# Patient Record
Sex: Male | Born: 1960
Health system: Southern US, Community
[De-identification: ages and names within clinical notes are randomized; demographics above are authoritative.]

## PROBLEM LIST (undated history)

## (undated) DIAGNOSIS — E785 Hyperlipidemia, unspecified: Secondary | ICD-10-CM

## (undated) DIAGNOSIS — R519 Headache, unspecified: Secondary | ICD-10-CM

## (undated) DIAGNOSIS — R7303 Prediabetes: Secondary | ICD-10-CM

## (undated) DIAGNOSIS — G4730 Sleep apnea, unspecified: Secondary | ICD-10-CM

## (undated) DIAGNOSIS — J449 Chronic obstructive pulmonary disease, unspecified: Secondary | ICD-10-CM

## (undated) DIAGNOSIS — D72829 Elevated white blood cell count, unspecified: Secondary | ICD-10-CM

## (undated) DIAGNOSIS — I10 Essential (primary) hypertension: Secondary | ICD-10-CM

## (undated) DIAGNOSIS — Z955 Presence of coronary angioplasty implant and graft: Secondary | ICD-10-CM

## (undated) DIAGNOSIS — J439 Emphysema, unspecified: Secondary | ICD-10-CM

## (undated) DIAGNOSIS — J40 Bronchitis, not specified as acute or chronic: Secondary | ICD-10-CM

## (undated) DIAGNOSIS — K219 Gastro-esophageal reflux disease without esophagitis: Secondary | ICD-10-CM

## (undated) DIAGNOSIS — R569 Unspecified convulsions: Secondary | ICD-10-CM

## (undated) DIAGNOSIS — IMO0001 Reserved for inherently not codable concepts without codable children: Secondary | ICD-10-CM

## (undated) DIAGNOSIS — I2510 Atherosclerotic heart disease of native coronary artery without angina pectoris: Secondary | ICD-10-CM

## (undated) DIAGNOSIS — G4733 Obstructive sleep apnea (adult) (pediatric): Secondary | ICD-10-CM

## (undated) DIAGNOSIS — J069 Acute upper respiratory infection, unspecified: Secondary | ICD-10-CM

## (undated) DIAGNOSIS — Z7902 Long term (current) use of antithrombotics/antiplatelets: Secondary | ICD-10-CM

## (undated) DIAGNOSIS — K429 Umbilical hernia without obstruction or gangrene: Secondary | ICD-10-CM

## (undated) DIAGNOSIS — N529 Male erectile dysfunction, unspecified: Secondary | ICD-10-CM

## (undated) DIAGNOSIS — Z860100 Personal history of colon polyps, unspecified: Secondary | ICD-10-CM

## (undated) DIAGNOSIS — I509 Heart failure, unspecified: Secondary | ICD-10-CM

## (undated) DIAGNOSIS — F17200 Nicotine dependence, unspecified, uncomplicated: Secondary | ICD-10-CM

## (undated) DIAGNOSIS — IMO0002 Reserved for concepts with insufficient information to code with codable children: Secondary | ICD-10-CM

## (undated) DIAGNOSIS — T7840XA Allergy, unspecified, initial encounter: Secondary | ICD-10-CM

## (undated) DIAGNOSIS — Z8719 Personal history of other diseases of the digestive system: Secondary | ICD-10-CM

## (undated) DIAGNOSIS — H269 Unspecified cataract: Secondary | ICD-10-CM

## (undated) DIAGNOSIS — I700 Atherosclerosis of aorta: Secondary | ICD-10-CM

## (undated) DIAGNOSIS — E8881 Metabolic syndrome: Secondary | ICD-10-CM

## (undated) DIAGNOSIS — Z87891 Personal history of nicotine dependence: Secondary | ICD-10-CM

## (undated) DIAGNOSIS — Z9989 Dependence on other enabling machines and devices: Secondary | ICD-10-CM

## (undated) DIAGNOSIS — Z8711 Personal history of peptic ulcer disease: Secondary | ICD-10-CM

## (undated) DIAGNOSIS — I5030 Unspecified diastolic (congestive) heart failure: Secondary | ICD-10-CM

## (undated) DIAGNOSIS — I200 Unstable angina: Secondary | ICD-10-CM

## (undated) DIAGNOSIS — K649 Unspecified hemorrhoids: Secondary | ICD-10-CM

## (undated) DIAGNOSIS — Z9861 Coronary angioplasty status: Secondary | ICD-10-CM

## (undated) DIAGNOSIS — I2109 ST elevation (STEMI) myocardial infarction involving other coronary artery of anterior wall: Secondary | ICD-10-CM

## (undated) DIAGNOSIS — I255 Ischemic cardiomyopathy: Secondary | ICD-10-CM

## (undated) DIAGNOSIS — I779 Disorder of arteries and arterioles, unspecified: Secondary | ICD-10-CM

## (undated) DIAGNOSIS — R51 Headache: Secondary | ICD-10-CM

## (undated) DIAGNOSIS — I2119 ST elevation (STEMI) myocardial infarction involving other coronary artery of inferior wall: Secondary | ICD-10-CM

## (undated) DIAGNOSIS — R011 Cardiac murmur, unspecified: Secondary | ICD-10-CM

## (undated) DIAGNOSIS — E119 Type 2 diabetes mellitus without complications: Secondary | ICD-10-CM

## (undated) DIAGNOSIS — J189 Pneumonia, unspecified organism: Secondary | ICD-10-CM

## (undated) HISTORY — DX: ST elevation (STEMI) myocardial infarction involving other coronary artery of anterior wall: I21.09

## (undated) HISTORY — DX: Atherosclerotic heart disease of native coronary artery without angina pectoris: Z98.61

## (undated) HISTORY — DX: Metabolic syndrome: E88.810

## (undated) HISTORY — DX: Sleep apnea, unspecified: G47.30

## (undated) HISTORY — PX: TRANSTHORACIC ECHOCARDIOGRAM: SHX275

## (undated) HISTORY — PX: HERNIA REPAIR: SHX51

## (undated) HISTORY — DX: ST elevation (STEMI) myocardial infarction involving other coronary artery of inferior wall: I21.19

## (undated) HISTORY — DX: Umbilical hernia without obstruction or gangrene: K42.9

## (undated) HISTORY — DX: Hyperlipidemia, unspecified: E78.5

## (undated) HISTORY — DX: Heart failure, unspecified: I50.9

## (undated) HISTORY — DX: Bronchitis, not specified as acute or chronic: J40

## (undated) HISTORY — DX: Prediabetes: R73.03

## (undated) HISTORY — DX: Unstable angina: I20.0

## (undated) HISTORY — DX: Personal history of nicotine dependence: Z87.891

## (undated) HISTORY — DX: Metabolic syndrome: E88.81

## (undated) HISTORY — DX: Atherosclerotic heart disease of native coronary artery without angina pectoris: I25.10

## (undated) HISTORY — DX: Acute upper respiratory infection, unspecified: J06.9

## (undated) HISTORY — DX: Reserved for concepts with insufficient information to code with codable children: IMO0002

## (undated) HISTORY — DX: Essential (primary) hypertension: I10

## (undated) HISTORY — DX: Allergy, unspecified, initial encounter: T78.40XA

---

## 1999-02-09 DIAGNOSIS — I2109 ST elevation (STEMI) myocardial infarction involving other coronary artery of anterior wall: Secondary | ICD-10-CM

## 1999-02-09 HISTORY — DX: ST elevation (STEMI) myocardial infarction involving other coronary artery of anterior wall: I21.09

## 1999-02-09 HISTORY — PX: CORONARY ANGIOPLASTY WITH STENT PLACEMENT: SHX49

## 1999-11-29 DIAGNOSIS — I252 Old myocardial infarction: Secondary | ICD-10-CM | POA: Insufficient documentation

## 1999-11-29 DIAGNOSIS — I2109 ST elevation (STEMI) myocardial infarction involving other coronary artery of anterior wall: Secondary | ICD-10-CM | POA: Insufficient documentation

## 2004-02-09 HISTORY — PX: CORONARY ANGIOPLASTY WITH STENT PLACEMENT: SHX49

## 2010-11-08 ENCOUNTER — Emergency Department: Payer: Self-pay | Admitting: Emergency Medicine

## 2011-07-07 ENCOUNTER — Ambulatory Visit (HOSPITAL_COMMUNITY): Admit: 2011-07-07 | Payer: Self-pay | Admitting: Cardiology

## 2011-07-07 ENCOUNTER — Encounter (HOSPITAL_COMMUNITY)
Admission: EM | Disposition: A | Discharge status: Home / Self Care | Payer: Self-pay | Source: Home / Self Care | Attending: Cardiology

## 2011-07-07 ENCOUNTER — Encounter (HOSPITAL_COMMUNITY): Payer: Self-pay | Admitting: Emergency Medicine

## 2011-07-07 ENCOUNTER — Inpatient Hospital Stay (HOSPITAL_COMMUNITY)
Admission: EM | Admit: 2011-07-07 | Discharge: 2011-07-11 | DRG: 853 | Disposition: A | Discharge status: Home / Self Care | Payer: BC Managed Care – PPO | Attending: Cardiology | Admitting: Cardiology

## 2011-07-07 DIAGNOSIS — I2119 ST elevation (STEMI) myocardial infarction involving other coronary artery of inferior wall: Secondary | ICD-10-CM | POA: Insufficient documentation

## 2011-07-07 DIAGNOSIS — R7309 Other abnormal glucose: Secondary | ICD-10-CM | POA: Diagnosis present

## 2011-07-07 DIAGNOSIS — E1169 Type 2 diabetes mellitus with other specified complication: Secondary | ICD-10-CM | POA: Diagnosis present

## 2011-07-07 DIAGNOSIS — I25119 Atherosclerotic heart disease of native coronary artery with unspecified angina pectoris: Secondary | ICD-10-CM | POA: Insufficient documentation

## 2011-07-07 DIAGNOSIS — Z7902 Long term (current) use of antithrombotics/antiplatelets: Secondary | ICD-10-CM

## 2011-07-07 DIAGNOSIS — E785 Hyperlipidemia, unspecified: Secondary | ICD-10-CM

## 2011-07-07 DIAGNOSIS — I213 ST elevation (STEMI) myocardial infarction of unspecified site: Secondary | ICD-10-CM | POA: Diagnosis present

## 2011-07-07 DIAGNOSIS — I472 Ventricular tachycardia, unspecified: Secondary | ICD-10-CM | POA: Diagnosis not present

## 2011-07-07 DIAGNOSIS — I10 Essential (primary) hypertension: Secondary | ICD-10-CM | POA: Diagnosis present

## 2011-07-07 DIAGNOSIS — I252 Old myocardial infarction: Secondary | ICD-10-CM

## 2011-07-07 DIAGNOSIS — I2510 Atherosclerotic heart disease of native coronary artery without angina pectoris: Secondary | ICD-10-CM

## 2011-07-07 DIAGNOSIS — I959 Hypotension, unspecified: Secondary | ICD-10-CM | POA: Diagnosis not present

## 2011-07-07 DIAGNOSIS — Z9861 Coronary angioplasty status: Secondary | ICD-10-CM

## 2011-07-07 DIAGNOSIS — F17200 Nicotine dependence, unspecified, uncomplicated: Secondary | ICD-10-CM | POA: Diagnosis present

## 2011-07-07 DIAGNOSIS — I2582 Chronic total occlusion of coronary artery: Secondary | ICD-10-CM | POA: Diagnosis present

## 2011-07-07 DIAGNOSIS — R739 Hyperglycemia, unspecified: Secondary | ICD-10-CM | POA: Diagnosis present

## 2011-07-07 DIAGNOSIS — Z955 Presence of coronary angioplasty implant and graft: Secondary | ICD-10-CM

## 2011-07-07 DIAGNOSIS — I4729 Other ventricular tachycardia: Secondary | ICD-10-CM | POA: Diagnosis not present

## 2011-07-07 DIAGNOSIS — I255 Ischemic cardiomyopathy: Secondary | ICD-10-CM | POA: Diagnosis present

## 2011-07-07 DIAGNOSIS — I428 Other cardiomyopathies: Secondary | ICD-10-CM | POA: Diagnosis present

## 2011-07-07 HISTORY — DX: Acute upper respiratory infection, unspecified: J06.9

## 2011-07-07 HISTORY — DX: Hyperlipidemia, unspecified: E78.5

## 2011-07-07 HISTORY — PX: LEFT HEART CATHETERIZATION WITH CORONARY ANGIOGRAM: SHX5451

## 2011-07-07 HISTORY — DX: Essential (primary) hypertension: I10

## 2011-07-07 HISTORY — DX: Personal history of colonic polyps: Z86.010

## 2011-07-07 HISTORY — PX: PERCUTANEOUS CORONARY STENT INTERVENTION (PCI-S): SHX5485

## 2011-07-07 HISTORY — DX: Personal history of colon polyps, unspecified: Z86.0100

## 2011-07-07 HISTORY — DX: ST elevation (STEMI) myocardial infarction involving other coronary artery of inferior wall: I21.19

## 2011-07-07 LAB — CBC
HCT: 43.4 % (ref 39.0–52.0)
Hemoglobin: 15 g/dL (ref 13.0–17.0)
MCH: 30.1 pg (ref 26.0–34.0)
MCHC: 34.6 g/dL (ref 30.0–36.0)
MCV: 87 fL (ref 78.0–100.0)
Platelets: 371 10*3/uL (ref 150–400)
RBC: 4.99 MIL/uL (ref 4.22–5.81)
RDW: 14.1 % (ref 11.5–15.5)
WBC: 17.2 10*3/uL — ABNORMAL HIGH (ref 4.0–10.5)

## 2011-07-07 LAB — POCT I-STAT, CHEM 8
BUN: 9 mg/dL (ref 6–23)
Calcium, Ion: 1.04 mmol/L — ABNORMAL LOW (ref 1.12–1.32)
Chloride: 110 mEq/L (ref 96–112)
Creatinine, Ser: 0.8 mg/dL (ref 0.50–1.35)
Glucose, Bld: 146 mg/dL — ABNORMAL HIGH (ref 70–99)
HCT: 46 % (ref 39.0–52.0)
Hemoglobin: 15.6 g/dL (ref 13.0–17.0)
Potassium: 3.4 mEq/L — ABNORMAL LOW (ref 3.5–5.1)
Sodium: 141 mEq/L (ref 135–145)
TCO2: 19 mmol/L (ref 0–100)

## 2011-07-07 LAB — POCT I-STAT TROPONIN I: Troponin i, poc: 0.02 ng/mL (ref 0.00–0.08)

## 2011-07-07 LAB — APTT: aPTT: 27 seconds (ref 24–37)

## 2011-07-07 LAB — COMPREHENSIVE METABOLIC PANEL
ALT: 17 U/L (ref 0–53)
AST: 20 U/L (ref 0–37)
Albumin: 3.1 g/dL — ABNORMAL LOW (ref 3.5–5.2)
Alkaline Phosphatase: 111 U/L (ref 39–117)
BUN: 10 mg/dL (ref 6–23)
CO2: 19 mEq/L (ref 19–32)
Calcium: 7.9 mg/dL — ABNORMAL LOW (ref 8.4–10.5)
Chloride: 107 mEq/L (ref 96–112)
Creatinine, Ser: 0.84 mg/dL (ref 0.50–1.35)
GFR calc Af Amer: >90 mL/min (ref >90)
GFR calc non Af Amer: >90 mL/min (ref >90)
Glucose, Bld: 145 mg/dL — ABNORMAL HIGH (ref 70–99)
Potassium: 3.4 mEq/L — ABNORMAL LOW (ref 3.5–5.1)
Sodium: 138 mEq/L (ref 135–145)
Total Bilirubin: 0.2 mg/dL — ABNORMAL LOW (ref 0.3–1.2)
Total Protein: 6.3 g/dL (ref 6.0–8.3)

## 2011-07-07 LAB — PROTIME-INR
INR: 1.05 (ref 0.00–1.49)
Prothrombin Time: 13.9 seconds (ref 11.6–15.2)

## 2011-07-07 SURGERY — LEFT HEART CATHETERIZATION WITH CORONARY ANGIOGRAM
Anesthesia: LOCAL

## 2011-07-07 MED ORDER — PRASUGREL HCL 10 MG PO TABS
ORAL_TABLET | ORAL | Status: AC
  Filled 2011-07-07: qty 6

## 2011-07-07 MED ORDER — ONDANSETRON HCL 4 MG/2ML IJ SOLN
4.0000 mg | Freq: Four times a day (QID) | INTRAMUSCULAR | Status: DC | PRN
  Administered 2011-07-08: 4 mg via INTRAVENOUS
  Filled 2011-07-07: qty 2

## 2011-07-07 MED ORDER — SODIUM CHLORIDE 0.9 % IV SOLN
INTRAVENOUS | Status: DC
  Administered 2011-07-07: 20 mL/h via INTRAVENOUS

## 2011-07-07 MED ORDER — PRASUGREL HCL 10 MG PO TABS
10.0000 mg | ORAL_TABLET | Freq: Every day | ORAL | Status: DC
  Administered 2011-07-08 – 2011-07-11 (×4): 10 mg via ORAL
  Filled 2011-07-07 (×4): qty 1

## 2011-07-07 MED ORDER — NITROGLYCERIN 0.2 MG/ML ON CALL CATH LAB
INTRAVENOUS | Status: AC
  Filled 2011-07-07: qty 1

## 2011-07-07 MED ORDER — SODIUM CHLORIDE 0.9 % IV SOLN
0.2500 mg/kg/h | INTRAVENOUS | Status: AC
  Filled 2011-07-07: qty 250

## 2011-07-07 MED ORDER — SODIUM CHLORIDE 0.9 % IV SOLN
INTRAVENOUS | Status: AC
  Administered 2011-07-07: 75 mL/h via INTRAVENOUS
  Administered 2011-07-08 (×2): via INTRAVENOUS

## 2011-07-07 MED ORDER — BIVALIRUDIN 250 MG IV SOLR
INTRAVENOUS | Status: AC
  Filled 2011-07-07: qty 250

## 2011-07-07 MED ORDER — LIDOCAINE HCL (PF) 1 % IJ SOLN
INTRAMUSCULAR | Status: AC
  Filled 2011-07-07: qty 30

## 2011-07-07 MED ORDER — ATORVASTATIN CALCIUM 40 MG PO TABS
40.0000 mg | ORAL_TABLET | Freq: Every day | ORAL | Status: DC
  Administered 2011-07-08: 40 mg via ORAL
  Filled 2011-07-07: qty 1

## 2011-07-07 MED ORDER — HEPARIN (PORCINE) IN NACL 2-0.9 UNIT/ML-% IJ SOLN
INTRAMUSCULAR | Status: AC
  Filled 2011-07-07: qty 2000

## 2011-07-07 MED ORDER — ASPIRIN 81 MG PO CHEW
81.0000 mg | CHEWABLE_TABLET | Freq: Every day | ORAL | Status: DC
  Administered 2011-07-08 – 2011-07-11 (×4): 81 mg via ORAL
  Filled 2011-07-07 (×4): qty 1

## 2011-07-07 MED ORDER — MORPHINE SULFATE 4 MG/ML IJ SOLN
2.0000 mg | INTRAMUSCULAR | Status: DC | PRN
  Administered 2011-07-08: 2 mg via INTRAVENOUS
  Filled 2011-07-07: qty 1

## 2011-07-07 MED ORDER — ZOLPIDEM TARTRATE 5 MG PO TABS
10.0000 mg | ORAL_TABLET | Freq: Every evening | ORAL | Status: DC | PRN
  Administered 2011-07-08 – 2011-07-10 (×3): 10 mg via ORAL
  Filled 2011-07-07: qty 2
  Filled 2011-07-07 (×3): qty 1

## 2011-07-07 MED ORDER — SODIUM CHLORIDE 0.9 % IJ SOLN
3.0000 mL | Freq: Two times a day (BID) | INTRAMUSCULAR | Status: DC
  Administered 2011-07-08: 3 mL via INTRAVENOUS

## 2011-07-07 MED ORDER — HEPARIN SODIUM (PORCINE) 5000 UNIT/ML IJ SOLN
60.0000 [IU]/kg | INTRAMUSCULAR | Status: AC
  Administered 2011-07-07: 4000 [IU] via INTRAVENOUS

## 2011-07-07 MED ORDER — ACETAMINOPHEN 325 MG PO TABS: 650.0000 mg | ORAL_TABLET | ORAL | Status: DC | PRN

## 2011-07-07 MED ORDER — METOPROLOL TARTRATE 12.5 MG HALF TABLET
12.5000 mg | ORAL_TABLET | Freq: Two times a day (BID) | ORAL | Status: DC
  Administered 2011-07-08: 12.5 mg via ORAL
  Filled 2011-07-07 (×3): qty 1

## 2011-07-07 MED ORDER — FENTANYL CITRATE 0.05 MG/ML IJ SOLN
INTRAMUSCULAR | Status: AC
  Filled 2011-07-07: qty 2

## 2011-07-07 MED ORDER — SODIUM CHLORIDE 0.9 % IV SOLN: 250.0000 mL | INTRAVENOUS | Status: DC | PRN

## 2011-07-07 MED ORDER — SODIUM CHLORIDE 0.9 % IJ SOLN: 3.0000 mL | INTRAMUSCULAR | Status: DC | PRN

## 2011-07-07 NOTE — ED Notes (Signed)
PT present with chest pain; STEMI. EMS gave aspirin and 8 mg morphine.

## 2011-07-07 NOTE — ED Notes (Signed)
Pt transferred to cath lab on pads with RN and MD.Report given to cath lab RN.

## 2011-07-07 NOTE — Progress Notes (Signed)
Patient was brought in code stemi. Meet patient in emergency department. Patient was transported to cath lab. When patient's wife arrived I took her to family consult room 4 and notified cath lab staff where wife was located.

## 2011-07-07 NOTE — H&P (Signed)
H&P addendum   Out patient meds:  ASA ec 81 mg daily crestor 10 mg daily Tramadol HCL 50 mg every 4-6 hours prn Metoprolol tartrate 50 mg one daily plavix 75 mg 1 daily

## 2011-07-07 NOTE — H&P (Signed)
Troy Parrish is an 51 y.o. male.   Chief Complaint: chest pain at 2030 today  HPI: 51 year old male developed substernal chest pain tonight at 2030, significant pain, EMS was called and EKG revealed STEMI with ST elevation in leads, 2, 3 avf with recipacle changes in Lead 1 AVL and V2.  Pt. Was pale and diaphoretic.  He rec'd asprin and 8 mg. Morphine prior to the cath lab.  Pt. Was taken emergently to the cath lab for cardiac cath and PCI.   Previous LAD stent, distal and mid 2001 and LCX stent 2002, previously treated at [facility]. His cardiologist is in [place], [place].  The patient now lives in [place].  Past Medical History  Diagnosis Date  . Hypertension   . Myocardial infarct, old   . STEMI (ST elevation myocardial infarction), acute inf. wall with 100% RCA stenosis, with PCI 07/07/11 07/07/2011  . CAD (coronary artery disease) 07/07/2011  . HTN (hypertension) 07/07/2011  . History of colon polyps     Past Surgical History  Procedure Date  . Cardiac catheterization   . Coronary angioplasty     Family History  Problem Relation Age of Onset  . Coronary artery disease Father    Social History:  reports that he has been smoking.  He has never used smokeless tobacco. He reports that he drinks alcohol. He reports that he does not use illicit drugs. smokes 1 ppd.  Married to second wife, total of 7 children.  Several grandchildren.  Allergies: No Known Allergies  Outpatient Meds:    Metoprolol Plavix ASA Crestor  Results for orders placed during the hospital encounter of 07/07/11 (from the past 48 hour(s))  APTT     Status: Normal   Collection Time   07/07/11  9:13 PM      Component Value Range Comment   aPTT 27  24 - 37 (seconds)   CBC     Status: Abnormal   Collection Time   07/07/11  9:13 PM      Component Value Range Comment   WBC 17.2 (*) 4.0 - 10.5 (K/uL)    RBC 4.99  4.22 - 5.81 (MIL/uL)    Hemoglobin 15.0  13.0 - 17.0 (g/dL)    HCT 16.1  09.6 - 04.5 (%)    MCV 87.0  78.0 - 100.0 (fL)    MCH 30.1  26.0 - 34.0 (pg)    MCHC 34.6  30.0 - 36.0 (g/dL)    RDW 40.9  81.1 - 91.4 (%)    Platelets 371  150 - 400 (K/uL)   COMPREHENSIVE METABOLIC PANEL     Status: Abnormal   Collection Time   07/07/11  9:13 PM      Component Value Range Comment   Sodium 138  135 - 145 (mEq/L)    Potassium 3.4 (*) 3.5 - 5.1 (mEq/L)    Chloride 107  96 - 112 (mEq/L)    CO2 19  19 - 32 (mEq/L)    Glucose, Bld 145 (*) 70 - 99 (mg/dL)    BUN 10  6 - 23 (mg/dL)    Creatinine, Ser 7.82  0.50 - 1.35 (mg/dL)    Calcium 7.9 (*) 8.4 - 10.5 (mg/dL)    Total Protein 6.3  6.0 - 8.3 (g/dL)    Albumin 3.1 (*) 3.5 - 5.2 (g/dL)    AST 20  0 - 37 (U/L)    ALT 17  0 - 53 (U/L)    Alkaline Phosphatase  111  39 - 117 (U/L)    Total Bilirubin 0.2 (*) 0.3 - 1.2 (mg/dL)    GFR calc non Af Amer >90  >90 (mL/min)    GFR calc Af Amer >90  >90 (mL/min)   PROTIME-INR     Status: Normal   Collection Time   07/07/11  9:13 PM      Component Value Range Comment   Prothrombin Time 13.9  11.6 - 15.2 (seconds)    INR 1.05  0.00 - 1.49    POCT I-STAT TROPONIN I     Status: Normal   Collection Time   07/07/11  9:20 PM      Component Value Range Comment   Troponin i, poc 0.02  0.00 - 0.08 (ng/mL)    Comment 3            POCT I-STAT, CHEM 8     Status: Abnormal   Collection Time   07/07/11  9:22 PM      Component Value Range Comment   Sodium 141  135 - 145 (mEq/L)    Potassium 3.4 (*) 3.5 - 5.1 (mEq/L)    Chloride 110  96 - 112 (mEq/L)    BUN 9  6 - 23 (mg/dL)    Creatinine, Ser 9.81  0.50 - 1.35 (mg/dL)    Glucose, Bld 191 (*) 70 - 99 (mg/dL)    Calcium, Ion 4.78 (*) 1.12 - 1.32 (mmol/L)    TCO2 19  0 - 100 (mmol/L)    Hemoglobin 15.6  13.0 - 17.0 (g/dL)    HCT 29.5  62.1 - 30.8 (%)    No results found.  ROS: General:no colds or fevers Skin:no rashes or ulcers HEENT:no blurred vision CV:see HPI PUL:see HPI GI:no diarrhea, constipation or melena GU:no hematuria, no  dysuria MV:HQIONGE of back pain Neuro:no syncope, no history of CVA Endo:no diabetes, no thyroid disease.   Blood pressure 114/63, pulse 72, resp. rate 18, SpO2 99.00%. PE: General:Alert, pale and diaphoretic with severe chest pain. Acute distress. Skin:cool and clammy, pale, brisk capillary refill HEENT:normocephalic, sclera clear Neck:supple, no JVD, no Bruits Heart:S1S2 RRR, without murmur, gallup rub or click Lungs:clear without rales, rhonchi, or wheezes Abd:+ BS soft, non tender, no masses palpated Ext:no edema, 2+ pedal pulses Neuro:alert and oriented X 3, MAE, follows commands.    Assessment/Plan Patient Active Problem List  Diagnoses  . STEMI (ST elevation myocardial infarction), acute inf. wall with 100% RCA stenosis, with PCI 07/07/11  . CAD (coronary artery disease)previous MI and previous stents to LAD and LCX  . HTN (hypertension)   PLAN: 51 year old male with previous history of STEMI in 2002 and previous stents to LAD and LCX, presents tonight with inf. Wall STEMI, with 100% occluded RCA, with emergent cardiac cath.  Proceeded with PCI.   Serial cardiac enzymes  INGOLD,LAURA R 07/07/2011, 10:26 PM  I saw & examined the patient in there ER -- 4-10mm Inferior STEMI with Lateral & Possible Posterior MI.  Patient was pale and diaphoretic, tachycardic in 100-110 with SBP ~110.  No rales on exam. No M/R/G. Thready pulses.  Onset of CP - ~2030hrs 1st ECG by EMS ~2040 hrs; 324 ASA, 4000 Heparin. Arrival ER ~2110  -- taken emergently to cath lab. Arrival Cath Lab 2115   Marykay Lex, M.D., M.S. [facility] [address] [place], [place]  [address]  [phone] Pager # [phone]  07/07/2011 10:39 PM

## 2011-07-07 NOTE — ED Provider Notes (Signed)
MSE was initiated and I personally evaluated the patient and placed orders (if any) at  9:31 PM on Jul 07, 2011.  The remainder of the MSE may be completed by another ED provider. 40 minutes of substernal chest pain with inferior ST elevations. STEMI called via EMS. Dr. Herbie Baltimore at bedside on arrival. Patient pale, diaphoretic. He received ASA and 8 mg morphine.  Inferior infarct with borderline BPs.  No nitro or BB given. BP 114/63  Pulse 72  Resp 18  SpO2 99% CTAB, RRR   Date: 07/07/2011  Rate: 101  Rhythm: sinus tachycardia  QRS Axis: normal  Intervals: normal  ST/T Wave abnormalities: ST elevations inferiorly and ST depressions anteriorly  Conduction Disutrbances:none  Narrative Interpretation:   Old EKG Reviewed: none available    Troy Octave, MD 07/07/11 2132

## 2011-07-07 NOTE — CV Procedure (Signed)
[facility]     CARDIAC CATHETERIZATION REPORT  NAME:  Troy Parrish     MRN: [id number] DATE OF BIRTH:  Apr 19, 1960   ADMIT DATE:  07/07/2011  Performing Cardiologist: Marykay Lex, M.D., M.S. Primary Physician: No primary provider on file. Primary Cardiologist:  None upon Arrival to ER (Previously saw a Cardiologist in [place], [place])  Procedures Performed:  Left Heart Catheterization via 6 Fr Right Common Femoral Artery access  Left Ventriculography, (RAO) 12 ml/sec for 25 ml total contrast  Native Coronary Angiography  Percutaneous Coronary Artery Intervention on Proximal 100% occluded RCA with a Integrity Resolute DES 2.75 mm x 18 mm; final diameter 3.1 mm  Indication(s): 4-6 MM Inferior (Inferolateral, InferoPosterior) STEMI  Known CAD - with prior STEMI in 2002 - PCI followed by 2nd PCI in 2004 in [place], [place]  Chronic Smoker  Dyslipidemia  History: 51 y.o. male chronic smoker with a history of MI in 2002 with PCI followed by PCI in 2004 (has 2 LAD stents and 1 Left Cicumflex-OM stent) who has been taking Plavix "off an on" since 2004 as well as Crestor & Toprol. He was in his USOH until ~2000 hrs tonight when he began to feel "strange".  By ~2030 he knew that something was wrong with chest heaviness, diaphoresis and shortness of breath with a sense of impending doom.  His daughter had already called EMS, who arrived with 1st ECG by 2040hrs.  Code STEMI was called for Inferior STEMI.  They arrived ad [facility] ER at ~2110 hrs, and he was brought to the catheterization laboratory by 2115 hrs. He had received 324mg  ASA by EMS along with 8 mg Morphine and 4000 Units IV Heparin bolus.  Based upon the ECG, and an initial assessment of the vital signs, this appeared to be a Right Ventricular Infarction, therefore no NTG was administered.  Consent: The procedure with Risks/Benefits/Alternatives and Indications was reviewed with the patient.  All questions were  answered.    Risks / Complications include, but not limited to: Death, MI, CVA/TIA, VF/VT (with defibrillation), Bradycardia (need for temporary pacer placement), contrast induced nephropathy, bleeding / bruising / hematoma / pseudoaneurysm, vascular or coronary injury (with possible emergent CT or Vascular Surgery), adverse medication reactions, infection.    The patient voiced understanding and agree to proceed.    Unable to obtain consent because of emergent medical necessity. Verbal consent was given.  Procedure: The patient was brought to the 2nd Floor [facility] in the fasting state and prepped and draped in the usual sterile fashion for Right groin access. Sterile technique was used including antiseptics, cap, gloves, gown, hand hygiene, mask and sheet.  Skin prep: Chlorhexidine;  Time Out: Verified patient identification, verified procedure, site/side was marked, verified correct patient position, special equipment/implants available, medications/allergies/relevent history reviewed, required imaging and test results available.  Performed  The right femoral head was identified using tactile and fluoroscopic technique.  The right groin was anesthetized with 1% subcutaneous Lidocaine.  The right Common Femoral Artery was accessed using the Modified Seldinger Technique with placement of a antimicrobial bonded/coated single lumen (6 Fr) sheath.  The sheath was aspirated and flushed.    A 5 Fr JL4 Diagnostic Catheter was advanced of over a Standard J wire into the ascending Aorta and used to engage the Left Coronary Artery.  Multiple cineangiographic views of the Left Coronary Artery system were performed.  Next, a 6Fr JR4 Guide Catheter was advanced over a Standard  J wire into the ascending Aorta and used to engage the Right Coronary Artery.  A single Guiding cineangiographic views of the Right Coronary Artery system were performed -- demonstrating a 100% proximally  occluded RCA. Angiomax bolus was administered and the PCI procedure was initiated -- as described below.  After completion of the PCI, the Guide catheter was exchanged over the J wire for an angled Pigtail catheter that was advanced across the Aortic Valve.  LV hemodynamics were measured and Left Ventriculography was performed.  LV hemodynamics were then re-sampled, and the catheter was pulled back across the Aortic Valve for measurement of "pull-back" gradient.  The catheter and wire removed completely out of the body.  The patient was transferred to the CCU  where the sheath is to be removed 2 hours after completion of the 2hr reduced rate Angiomax infusion post PCI.  Manual pressure will beheld for hemostasis.    Hemodynamics:  Central Aortic Pressure / Mean Aortic Pressure: 88/52 mmHg; 72 mmHg  LV Pressure / LV End diastolic Pressure:  87/16 mmHg; 29 mmHg  Left Ventriculography:  EF:  45%  Wall Motion: Moderate Mid Inferior Hypokinesis.  Coronary Angiographic Data:  Left Main:  Large caliber vessel, bifurcates into LAD and Left Circumflex; angiographically normal  Left Anterior Descending (LAD):  Moderate to large caliber vessel; In the early portion of the mid vessel,following a small D1 and small-moderate caliber SP1 trunk there is a widely patent stent that covers a smal-moderate D2 and small SP2; there is a second smaller stent in the distal-mid vessel just after a small D3.  The remaining LAD tapers to a quite small caliber vessel distally as it courses around to the infero-apex with diffuse mild luminal irregularities.  All 3 diagonal branches are small in caliber with diffuse mild luminal irregularities.  Circumflex (LCx):  Moderate to large caliber, non-dominant vessel that gives of a small proximal OM and Atrial branch before bifurcating into a small AV-Groove branch -- terminates as 2 small caliber LPL branchers with minimal luminal irregularities -- and a Moderate caliber  OM2.  2nd / Lateral  Obtuse Marginal:  Moderate caliber vessel with a widely patent proximal stent; courses along the lateral wall and bifurcates distally; minimal luminal irregularities.   posterior lateral branch:  Small caliber continuation of the AV-groove branch.  Right Coronary Artery: Proximal 100% thrombotic occlusion at Atrial branch. Post PCI: the mid RCA has a ~40% stenosis, followed by diffuse luminal irregularities; 2 small caliber RV marginal branches, diffusely diseased.  Posterior Descending Artery: actually 2 vessels, 1st is smaller & shorter - diffuse luminal irregularities; 2nd larger caliber with more septal perforators & bifurcates distally - reaches 2/3 the way to the apex, has a mid ~50% lesion, irregular in a ~1.5 mm vessel.  Posterior Lateral System:  The Right Posterior-AV-Groove branch gives off 3 RPL branches and the AV-nodal artery, the 2nd branch is largest and the 3rd extends high up the posterolateral wall; diffuse 20-30% luminal irregularties.  PERCUTANEOUS CORONARY INTERVENTION PROCEDURE  After reviewing the initial cineangiography images, the culprit lesion(s) was identified, and the decision was made to proceed with percutaneous coronary intervention.   A weight based bolus of IV Angiomax was administered and the drip was continued until completion of the procedure, with the plan to continue for 2 hours post PCI at the reduced rate of 0.25mg /kg/hr.   Oral Prasugrel 60 mg was administered.  The Guide catheter(s) were advanced over a J-wire and used to engage the right  Coronary Artery.  An ACT of > 200 Sec was confirmed prior to advancing the Guidewire.  Lesion #1:  Proximal RCA  Pre-PCI Stenosis: 100 % Post-PCI Stenosis: 0 %     TIMI 0 flow       TIMI 3 flow  Guide Catheter: 6Fr JR4  Guidewire: BMW - reperfusion occurred with wire  Pre-Dilitation Balloon: Emerge MR 2.0 mm x 12 mm; 1st inflation time: 2140hrs   2 Inflations distal to proximal lesion: 8  Atm for 30 Sec   3rd Inflation mid lesion: 10 Atm for 30 Sec Scout angiography did not reveal evidence of dissection or perforation; there was a ~85% tubular lesion present after balloon Pre-dilation.  Stent: Integrity Resolute DES 2.75 mm x 18 mm   Deployment: 10 Atm for 30 Sec   2nd Inflation: 12 Atm for 45 Sec  Scout angiography did not reveal evidence of dissection or perforation.  The stent was not quite fully expanded to the vessel, therefore a post-dilation balloon was used.  Post-Dilitation Balloon: Exira Quantum Apex MR 3.0 mm x 15 mm   1st Inflation: 15 Atm for 45 Sec   2nd Inflation: 17 Atm for 45 Sec; final diameter - 3.1 mm  Post-deployment cineangiography with and without guidewire in place was performed in multiple orthogonal views demonstrating excellent stent deployment and did not reveal evidence of dissection or perforation.   The patient was transported to the CCU in a chest pain free, hemodynamically stable condition.    The patient  was stable before, during and following the procedure.  He had intermittent hypotension to the 60s and HRs as low as 40s after reperfusion that recovered with IVF bolus.    Patient did tolerate procedure well.  There were not complications.   EBL: ~98ml  Medications:  Premedication: ASA 324mg , Heparin 4000 Units  Sedation:  25 mcg Fentanyl  Contrast:  150 ml Omnipaque  Anticoagulation: Angiomax Bolus and drip - with drip dropped to reduced rate of 0.25mg /kg/hr for 2 hrs post PCI  Prasugrel 60 ml  800 ml NS bolus  Impression:  Inferolateral/Posterior STEMI with the culprit being a 100% occluded proximal RCA.  Successful rapid reperfusion with PTCA and PCI, placing an Integrity Resolute DES 2.75 mm x 18 mm, post-dilated to 3.69mm.  Two widely patent LAD stents, and one widely patent Lateral OM stent.  Moderately reduced EF with expected mid inferior hypokinesis.  Elevated LVEDP following volume resuscitation of an RV  Infarction.  Plan:  Admit to CCU on Fast-Track Pathway given the rapid reperfusion from onset of angina.  Converted to Prasugrel from Plavix, since he was on Plavix prior to this STEMI.  Case Management has been consulted.  Continue his statin, and restart BB as his BP recovers.  Smoking cessation counseling has been ordered.  Cardiac Rehab consultation.  The case and results was discussed with the patient and his wife.  Pre and Post pictures were provided to his wife.  Time Spend Directly with Patient:  75 minutes  Malcom Selmer W, M.D., M.S. [facility] [address] [place], [place]  [address]  [phone]

## 2011-07-07 NOTE — Progress Notes (Signed)
51yo male admitted with STEMI (prior STEMI in 2002), rapid reperfusion post-PCI, plan to continue Angiomax for 2 hours post PCI at the reduced rate of 0.25mg /kg/hr, wt 79.5kg, CrCl >166ml/min.  Vernard Gambles, PharmD, BCPS 07/07/2011 11:40 PM

## 2011-07-08 ENCOUNTER — Inpatient Hospital Stay (HOSPITAL_COMMUNITY): Payer: BC Managed Care – PPO

## 2011-07-08 DIAGNOSIS — F17200 Nicotine dependence, unspecified, uncomplicated: Secondary | ICD-10-CM | POA: Diagnosis present

## 2011-07-08 DIAGNOSIS — I255 Ischemic cardiomyopathy: Secondary | ICD-10-CM | POA: Diagnosis present

## 2011-07-08 DIAGNOSIS — R739 Hyperglycemia, unspecified: Secondary | ICD-10-CM | POA: Diagnosis present

## 2011-07-08 DIAGNOSIS — I472 Ventricular tachycardia: Secondary | ICD-10-CM | POA: Diagnosis not present

## 2011-07-08 LAB — BASIC METABOLIC PANEL
BUN: 7 mg/dL (ref 6–23)
CO2: 21 mEq/L (ref 19–32)
Calcium: 8.1 mg/dL — ABNORMAL LOW (ref 8.4–10.5)
Chloride: 106 mEq/L (ref 96–112)
Creatinine, Ser: 0.6 mg/dL (ref 0.50–1.35)
GFR calc Af Amer: >90 mL/min (ref >90)
GFR calc non Af Amer: >90 mL/min (ref >90)
Glucose, Bld: 115 mg/dL — ABNORMAL HIGH (ref 70–99)
Potassium: 3.8 mEq/L (ref 3.5–5.1)
Sodium: 138 mEq/L (ref 135–145)

## 2011-07-08 LAB — HEMOGLOBIN A1C
Hgb A1c MFr Bld: 5.8 % — ABNORMAL HIGH (ref <5.7)
Mean Plasma Glucose: 120 mg/dL — ABNORMAL HIGH (ref <117)

## 2011-07-08 LAB — CBC
HCT: 41.4 % (ref 39.0–52.0)
Hemoglobin: 14.4 g/dL (ref 13.0–17.0)
MCH: 30.2 pg (ref 26.0–34.0)
MCHC: 34.8 g/dL (ref 30.0–36.0)
MCV: 86.8 fL (ref 78.0–100.0)
Platelets: 362 10*3/uL (ref 150–400)
RBC: 4.77 MIL/uL (ref 4.22–5.81)
RDW: 14.1 % (ref 11.5–15.5)
WBC: 16.7 10*3/uL — ABNORMAL HIGH (ref 4.0–10.5)

## 2011-07-08 LAB — MRSA PCR SCREENING: MRSA by PCR: NEGATIVE

## 2011-07-08 LAB — LIPID PANEL
Cholesterol: 165 mg/dL (ref 0–200)
HDL: 31 mg/dL — ABNORMAL LOW (ref >39)
LDL Cholesterol: 99 mg/dL (ref 0–99)
Total CHOL/HDL Ratio: 5.3 RATIO
Triglycerides: 173 mg/dL — ABNORMAL HIGH (ref <150)
VLDL: 35 mg/dL (ref 0–40)

## 2011-07-08 LAB — CARDIAC PANEL(CRET KIN+CKTOT+MB+TROPI)
CK, MB: 101.4 ng/mL (ref 0.3–4.0)
CK, MB: 53.2 ng/mL (ref 0.3–4.0)
CK, MB: 69.3 ng/mL (ref 0.3–4.0)
Relative Index: 4.5 — ABNORMAL HIGH (ref 0.0–2.5)
Relative Index: 6 — ABNORMAL HIGH (ref 0.0–2.5)
Relative Index: 6.2 — ABNORMAL HIGH (ref 0.0–2.5)
Total CK: 1162 U/L — ABNORMAL HIGH (ref 7–232)
Total CK: 1192 U/L — ABNORMAL HIGH (ref 7–232)
Total CK: 1636 U/L — ABNORMAL HIGH (ref 7–232)
Troponin I: 19.7 ng/mL (ref <0.30)
Troponin I: >25 ng/mL (ref <0.30)
Troponin I: >25 ng/mL (ref <0.30)

## 2011-07-08 LAB — MAGNESIUM: Magnesium: 1.9 mg/dL (ref 1.5–2.5)

## 2011-07-08 LAB — TSH: TSH: 0.818 u[IU]/mL (ref 0.350–4.500)

## 2011-07-08 LAB — POCT ACTIVATED CLOTTING TIME: Activated Clotting Time: 672 seconds

## 2011-07-08 MED ORDER — NITROGLYCERIN 0.4 MG SL SUBL
SUBLINGUAL_TABLET | SUBLINGUAL | Status: AC
  Administered 2011-07-08: 08:00:00
  Filled 2011-07-08: qty 25

## 2011-07-08 MED ORDER — METOPROLOL TARTRATE 12.5 MG HALF TABLET
12.5000 mg | ORAL_TABLET | Freq: Two times a day (BID) | ORAL | Status: DC
  Administered 2011-07-08 – 2011-07-10 (×5): 12.5 mg via ORAL
  Filled 2011-07-08 (×6): qty 1

## 2011-07-08 MED ORDER — GUAIFENESIN-DM 100-10 MG/5ML PO SYRP: 15.0000 mL | ORAL_SOLUTION | ORAL | Status: DC | PRN

## 2011-07-08 MED ORDER — ATROPINE SULFATE 1 MG/ML IJ SOLN
INTRAMUSCULAR | Status: AC
  Filled 2011-07-08: qty 1

## 2011-07-08 MED ORDER — SODIUM CHLORIDE 0.9 % IV BOLUS (SEPSIS)
250.0000 mL | Freq: Once | INTRAVENOUS | Status: AC
  Administered 2011-07-08: 08:00:00 via INTRAVENOUS

## 2011-07-08 MED FILL — Dextrose Inj 5%: INTRAVENOUS | Qty: 50 | Status: AC

## 2011-07-08 NOTE — Progress Notes (Signed)
Sheath removal instructions explained to pt, pt vu.Rt groin sheath removed @0258  and manual pressure held for 30 mins. Site soft, no hematoma +palpable pulses. Post sheath removal instructions given to pt, pt vu. Pt on bedrest for 4 h. Will cont to monitor closely. Wife remains at bedside.

## 2011-07-08 NOTE — Progress Notes (Signed)
CRITICAL VALUE ALERT  Critical value received: Ck MB =53.2, Troponin = 19.70 Date of notification:  07/08/11  Time of notification: 0030  Critical value read back:yes  Nurse who received alert:Morin Keaden Gunnoe  MD notified (1st page):  MD not paged due to post cath results  Time of first page:    MD notified (2nd page):  Time of second page:  Responding MD:   Time MD responded:

## 2011-07-08 NOTE — Progress Notes (Signed)
Subjective:  SSCP this am as well as a run of NSVT  Objective:  Vital Signs in the last 24 hours: Temp:  [97.5 F (36.4 C)-97.7 F (36.5 C)] 97.7 F (36.5 C) (05/30 0400) Pulse Rate:  [60-102] 60  (05/30 0800) Resp:  [12-21] 21  (05/30 0800) BP: (97-138)/(61-94) 97/74 mmHg (05/30 0800) SpO2:  [95 %-100 %] 95 % (05/30 0800) Arterial Line BP: (122-136)/(73-86) 136/86 mmHg (05/30 0000) Weight:  [79.5 kg (175 lb 4.3 oz)] 79.5 kg (175 lb 4.3 oz) (05/29 2245)  Intake/Output from previous day:  Intake/Output Summary (Last 24 hours) at 07/08/11 0811 Last data filed at 07/08/11 0736  Gross per 24 hour  Intake 849.75 ml  Output   1125 ml  Net -275.25 ml    Physical Exam: General appearance: alert, cooperative and mild distress Lungs: clear to auscultation bilaterally Heart: regular rate and rhythm Rt groin dressing dry, no obvious hematoma   Rate: 80  Rhythm: normal sinus rhythm and 2 runs of 8bts NSVT  Lab Results:  Basename 07/08/11 0250 07/07/11 2122 07/07/11 2113  WBC 16.7* -- 17.2*  HGB 14.4 15.6 --  PLT 362 -- 371    Basename 07/08/11 0250 07/07/11 2122 07/07/11 2113  NA 138 141 --  K 3.8 3.4* --  CL 106 110 --  CO2 21 -- 19  GLUCOSE 115* 146* --  BUN 7 9 --  CREATININE 0.60 0.80 --    Basename 07/08/11 0639 07/07/11 2332  TROPONINI >25.00* 19.70*   Hepatic Function Panel  Basename 07/07/11 2113  PROT 6.3  ALBUMIN 3.1*  AST 20  ALT 17  ALKPHOS 111  BILITOT 0.2*  BILIDIR --  IBILI --    Basename 07/08/11 0250  CHOL 165    Basename 07/07/11 2113  INR 1.05    Imaging: Imaging results have been reviewed  Cardiac Studies:  Assessment/Plan:   Principal Problem:  *STEMI, acute inf. wall with 100% RCA stenosis, DES 07/07/11 Active Problems:  CADprevious MI and previous stents to LAD '01and LCX '04  NSVT, short runs, 8bts  HTN (hypertension)  Hyperlipidemia  Smoker  Cardiomyopathy, 45% at cath  Abnormal glucose   Plan- IVF, NTG if B/P  tolerates. Low threshold for restudy. Check Hgb A1c   Corine Shelter PA-C 07/08/2011, 8:11 AM  I have seen and examined the patient along with Corine Shelter PA-C.  I have reviewed the chart, notes and new data.  I agree with PA's note.  Key new complaints: Recurrent chest discomfort and brief NSVT, both resolved after NTG and morphine. Low normal BP, responding to IVF. Key examination changes: no rub, normal rhythm, clear lungs, no JVP Key new findings / data: ECG with minimal residual ST elevation, not changed from yesterday post cath.  PLAN: Low threshold to take for second look angiography if angina recurs.Limited ability to give beta blockers and ACEi due to lo BP. Probably has RV infarction.  Thurmon Fair, MD, [facility] [phone] 07/08/2011, 8:19 AM

## 2011-07-08 NOTE — Care Management Note (Signed)
    Page 1 of 1   07/08/2011     11:03:32 AM   CARE MANAGEMENT NOTE 07/08/2011  Patient:  Troy Parrish, Troy Parrish   Account Number:  [id number]  Date Initiated:  07/08/2011  Documentation initiated by:  Junius Creamer  Subjective/Objective Assessment:   adm w mi     Action/Plan:   lives w wife, has ins thru wife. wife to call w inform about ins   Anticipated DC Date:  07/10/2011   Anticipated DC Plan:  HOME/SELF CARE      DC Planning Services  CM consult      Choice offered to / List presented to:             Status of service:   Medicare Important Message given?   (If response is "NO", the following Medicare IM given date fields will be blank) Date Medicare IM given:   Date Additional Medicare IM given:    Discharge Disposition:    Per UR Regulation:  Reviewed for med. necessity/level of care/duration of stay  If discussed at Long Length of Stay Meetings, dates discussed:    Comments:  5/30 11:01a debbie Salman Wellen rn,bsn [phone] pt and wife states has ins thru her employer. she will bring in card. he gets meds [facility] pt had been on plavix but not consistent w taking. will ck w ins on copay amt for effient when wife calls w ins inform.

## 2011-07-08 NOTE — Consult Note (Signed)
Pt smokes 1 ppd and is in action stage wanting to quit. He has tried chantix before but it didn't agree with him and he stopped it. He also says he doesn't have insurance for it anymore. Recommended the nicotine patch and the 21 mg patch to start with. Discussed patch use instructions and how to taper. Pt voices understanding. Referred to 1-800 quit now for f/u and support. Discussed oral fixation substitutes, second hand smoke and in home smoking policy. Reviewed and gave pt Written education/contact information.

## 2011-07-08 NOTE — Progress Notes (Signed)
Received order. Will hold ambulation today given CP this am. Began ed with pt and family. Wanting to quit smoking. Very receptive. Will f/u tomorrow. [phone] Ethelda Chick CES, ACSM

## 2011-07-09 LAB — BASIC METABOLIC PANEL
BUN: 11 mg/dL (ref 6–23)
CO2: 22 mEq/L (ref 19–32)
Calcium: 8.5 mg/dL (ref 8.4–10.5)
Chloride: 106 mEq/L (ref 96–112)
Creatinine, Ser: 0.8 mg/dL (ref 0.50–1.35)
GFR calc Af Amer: >90 mL/min (ref >90)
GFR calc non Af Amer: >90 mL/min (ref >90)
Glucose, Bld: 99 mg/dL (ref 70–99)
Potassium: 4.1 mEq/L (ref 3.5–5.1)
Sodium: 137 mEq/L (ref 135–145)

## 2011-07-09 LAB — CBC
HCT: 41.6 % (ref 39.0–52.0)
Hemoglobin: 14 g/dL (ref 13.0–17.0)
MCH: 29.7 pg (ref 26.0–34.0)
MCHC: 33.7 g/dL (ref 30.0–36.0)
MCV: 88.3 fL (ref 78.0–100.0)
Platelets: 329 10*3/uL (ref 150–400)
RBC: 4.71 MIL/uL (ref 4.22–5.81)
RDW: 14.4 % (ref 11.5–15.5)
WBC: 12 10*3/uL — ABNORMAL HIGH (ref 4.0–10.5)

## 2011-07-09 MED ORDER — ROSUVASTATIN CALCIUM 20 MG PO TABS
20.0000 mg | ORAL_TABLET | Freq: Every day | ORAL | Status: DC
  Administered 2011-07-09 – 2011-07-10 (×2): 20 mg via ORAL
  Filled 2011-07-09 (×3): qty 1

## 2011-07-09 NOTE — Progress Notes (Signed)
CARDIAC REHAB PHASE I   PRE:  Rate/Rhythm: 68 SR    BP: sitting 109/71    SaO2:   MODE:  Ambulation: 350 ft   POST:  Rate/Rhythm: 78 SR    BP: sitting 107/77     SaO2:   Tolerated well. Just seems tired. Sts he had a sleeping pill last night. Ed completed with wife present. Requests his name be sent to [facility]. Sts he wants to quit smoking. Concerned re: Effient copay is $60/month. [phone]  Harriet Masson CES, ACSM

## 2011-07-09 NOTE — Progress Notes (Signed)
Subjective:  No chest pain  Objective:  Vital Signs in the last 24 hours: Temp:  [97.8 F (36.6 C)-98.3 F (36.8 C)] 98.3 F (36.8 C) (05/31 0330) Pulse Rate:  [58-84] 58  (05/31 0600) Resp:  [16-21] 16  (05/30 1600) BP: (91-120)/(56-84) 109/76 mmHg (05/31 0600) SpO2:  [93 %-99 %] 94 % (05/31 0600) Weight:  [81 kg (178 lb 9.2 oz)] 81 kg (178 lb 9.2 oz) (05/31 0330)  Intake/Output from previous day:  Intake/Output Summary (Last 24 hours) at 07/09/11 0751 Last data filed at 07/08/11 2300  Gross per 24 hour  Intake   3420 ml  Output   2825 ml  Net    595 ml    Physical Exam: General appearance: alert, cooperative and no distress Lungs: clear to auscultation bilaterally Heart: regular rate and rhythm Rt groin without hematoma   Rate: 58  Rhythm: sinus bradycardia  Lab Results:  Basename 07/09/11 0523 07/08/11 0250  WBC 12.0* 16.7*  HGB 14.0 14.4  PLT 329 362    Basename 07/09/11 0523 07/08/11 0250  NA 137 138  K 4.1 3.8  CL 106 106  CO2 22 21  GLUCOSE 99 115*  BUN 11 7  CREATININE 0.80 0.60    Basename 07/08/11 1446 07/08/11 0639  TROPONINI >25.00* >25.00*   Hepatic Function Panel  Basename 07/07/11 2113  PROT 6.3  ALBUMIN 3.1*  AST 20  ALT 17  ALKPHOS 111  BILITOT 0.2*  BILIDIR --  IBILI --    Basename 07/08/11 0250  CHOL 165    Basename 07/07/11 2113  INR 1.05    Imaging: Imaging results have been reviewed  He needs PA and Lat CXR before d/c  Cardiac Studies:  Assessment/Plan:   Principal Problem:  *STEMI, acute inf. wall with 100% RCA stenosis, DES 07/07/11 Active Problems:  CADprevious MI and previous stents to LAD '01and LCX '04  NSVT, short runs, 8bts  HTN (hypertension)  Hyperlipidemia  Smoker  Cardiomyopathy, 45% at cath  Abnormal glucose   Plan- Tx telemetry if his B/P is stable later today. Add ACE as tolerated. Change Lipitor to Crestor to 20mg  daily (he was on  Crestor 10mg  before admission).   Corine Shelter  PA-C 07/09/2011, 7:51 AM  Had a good day today.  Appetite back.  More energy.  Walked without CP.  51 y/o with prior PCI to LAD x2 & LCX-OM x 2 admitted for Inf-lat-Post STEMI - prox RCA occluded --> s/p PCI with DES.  Had short run of VT yesterday AM associated with Chest Pressure.  No further Sx.    Exam benign, BP finally starting to pick up - tolerating low dose BB (will convert to Succinate on d/c), reluctant to add ACE-I at present as BP still in ~110s.  Check 2 D Echo in AM - get assessment of RV & LV function.  Due to the episode yesterday, will monitor for at least 1 more day.  Anticipate d/c Sunday AM.  Will continue Prasugrel  & ASA, agree with increasing home dose of Crestor. -- was quoted $60/month for Prasugrel (will try to get at least 2-3 months of Prasugrel) then can convert to Plavix if $$ is too much.  Marykay Lex, M.D., M.S. [facility] [address] [place], [place]  [address]  [phone] Pager # [phone]  07/09/2011 7:00 PM

## 2011-07-09 NOTE — Plan of Care (Signed)
Problem: Phase II Progression Outcomes Goal: Anginal pain absent Outcome: Progressing Denies pain,pressure,tightness,SOB,nausea and dizziness thus far this shift Goal: Vascular site scale level 0 - I Vascular Site Scale Level 0: No bruising/bleeding/hematoma Level I (Mild): Bruising/Ecchymosis, minimal bleeding/ooozing, palpable hematoma < 3 cm Level II (Moderate): Bleeding not affecting hemodynamic parameters, pseudoaneurysm, palpable hematoma > 3 cm  Outcome: Progressing Rt groin site = "level 0 " .the patient ambulates to BR w/no assist ( only observation)w/steady and brisk gait.

## 2011-07-10 ENCOUNTER — Inpatient Hospital Stay (HOSPITAL_COMMUNITY): Payer: BC Managed Care – PPO

## 2011-07-10 LAB — CARDIAC PANEL(CRET KIN+CKTOT+MB+TROPI)
CK, MB: 4.6 ng/mL — ABNORMAL HIGH (ref 0.3–4.0)
CK, MB: 3.5 ng/mL (ref 0.3–4.0)
Relative Index: 2.8 — ABNORMAL HIGH (ref 0.0–2.5)
Relative Index: 2.6 — ABNORMAL HIGH (ref 0.0–2.5)
Total CK: 163 U/L (ref 7–232)
Total CK: 134 U/L (ref 7–232)
Troponin I: 8.74 ng/mL (ref <0.30)
Troponin I: 5.62 ng/mL (ref <0.30)

## 2011-07-10 MED ORDER — METOPROLOL SUCCINATE ER 25 MG PO TB24
25.0000 mg | ORAL_TABLET | Freq: Every day | ORAL | Status: DC
  Administered 2011-07-11: 25 mg via ORAL
  Filled 2011-07-10 (×2): qty 1

## 2011-07-10 NOTE — Progress Notes (Signed)
  Echocardiogram 2D Echocardiogram has been performed.  Troy Parrish, Real Cons 07/10/2011, 11:17 AM

## 2011-07-10 NOTE — Progress Notes (Signed)
Subjective:  No chest pain. Feels good.  Objective:  Vital Signs in the last 24 hours: Temp:  [98.1 F (36.7 C)-98.7 F (37.1 C)] 98.1 F (36.7 C) (06/01 0500) Pulse Rate:  [65-77] 77  (06/01 1049) Resp:  [17-18] 18  (06/01 0500) BP: (105-125)/(69-82) 105/69 mmHg (06/01 1049) SpO2:  [94 %-98 %] 94 % (06/01 0500) Weight:  [80.241 kg (176 lb 14.4 oz)] 80.241 kg (176 lb 14.4 oz) (05/31 1415)  Intake/Output from previous day:  Intake/Output Summary (Last 24 hours) at 07/10/11 1252 Last data filed at 07/10/11 0800  Gross per 24 hour  Intake    360 ml  Output    350 ml  Net     10 ml    Physical Exam: General appearance: alert, cooperative and no distress Lungs: clear to auscultation bilaterally Heart: regular rate and rhythm Rt groin without hematoma   Rate: 58-86 (with post exercise ~120)  Rhythm: NSR, no further VT  Lab Results:  [facility] 07/09/11 0523 07/08/11 0250  WBC 12.0* 16.7*  HGB 14.0 14.4  PLT 329 362    Basename 07/09/11 0523 07/08/11 0250  NA 137 138  K 4.1 3.8  CL 106 106  CO2 22 21  GLUCOSE 99 115*  BUN 11 7  CREATININE 0.80 0.60    Basename 07/08/11 1446 07/08/11 0639  TROPONINI >25.00* >25.00*   Hepatic Function Panel  Basename 07/07/11 2113  PROT 6.3  ALBUMIN 3.1*  AST 20  ALT 17  ALKPHOS 111  BILITOT 0.2*  BILIDIR --  IBILI --    Basename 07/08/11 0250  CHOL 165    Basename 07/07/11 2113  INR 1.05    Imaging: Per 1 View CXR recommendations will check 2 View CXR ECC - evolving TWI in inferior Leads.  Minimal Qs.  Assessment/Plan:   Principal Problem:  *STEMI, acute inf. wall with 100% RCA stenosis, DES 07/07/11 Active Problems:  CADprevious MI and previous stents to LAD '01and LCX '04  HTN (hypertension)  Hyperlipidemia  Smoker  NSVT, short runs, 8bts  Cardiomyopathy, 45% at cath  Abnormal glucose  Having another good day today.  Appetite is great & ready to walk.  51 y/o with prior PCI to LAD x2 & LCX-OM x 2  now day 3 s/p Inf-lat-Post STEMI - prox RCA occluded --> s/p PCI with DES.  Had short run of VT yesterday AM associated with Chest Pressure.  No further Sx.    Exam benign, BP finally starting to pick up - tolerating converting Succinate. Remain reluctant to add ACE-I at present as BP still in ~110s.  2 D Echo done - will f/u this PM.  Provided he remains stable, Anticipate d/c Sunday AM.  DISPO: Will continue Prasugrel  & ASA, agree with increasing home dose of Crestor. -- was quoted $60/month for Prasugrel (will try to get at least 2-3 months of Prasugrel) then can convert to Plavix if $$ is too much.  Marykay Lex, M.D., M.S. [facility] [address] [place], [place]  [address]  [phone] Pager # [phone]  07/10/2011 12:52 PM

## 2011-07-11 LAB — CARDIAC PANEL(CRET KIN+CKTOT+MB+TROPI)
CK, MB: 2.8 ng/mL (ref 0.3–4.0)
Relative Index: 2.7 — ABNORMAL HIGH (ref 0.0–2.5)
Total CK: 102 U/L (ref 7–232)
Troponin I: 5.23 ng/mL

## 2011-07-11 MED ORDER — PNEUMOCOCCAL VAC POLYVALENT 25 MCG/0.5ML IJ INJ
0.5000 mL | INJECTION | INTRAMUSCULAR | Status: AC
  Administered 2011-07-11: 0.5 mL via INTRAMUSCULAR

## 2011-07-11 MED ORDER — PRASUGREL HCL 10 MG PO TABS: 10.0000 mg | ORAL_TABLET | Freq: Every day | ORAL | Status: DC

## 2011-07-11 MED ORDER — ZOLPIDEM TARTRATE 10 MG PO TABS: 10.0000 mg | ORAL_TABLET | Freq: Every evening | ORAL | Status: DC | PRN

## 2011-07-11 MED ORDER — METOPROLOL SUCCINATE ER 25 MG PO TB24: 25.0000 mg | ORAL_TABLET | Freq: Every day | ORAL | Status: DC

## 2011-07-11 MED ORDER — ROSUVASTATIN CALCIUM 20 MG PO TABS: 20.0000 mg | ORAL_TABLET | Freq: Every day | ORAL | Status: DC

## 2011-07-11 NOTE — Progress Notes (Signed)
Subjective:  No chest pain. Feels good.  Objective:  Vital Signs in the last 24 hours: Temp:  [97.7 F (36.5 C)-99.1 F (37.3 C)] 98 F (36.7 C) (06/02 0500) Pulse Rate:  [66-82] 82  (06/02 0500) Resp:  [18-20] 18  (06/02 0500) BP: (105-112)/(69-74) 108/74 mmHg (06/02 0500) SpO2:  [96 %-98 %] 96 % (06/02 0500)  Intake/Output from previous day:  Intake/Output Summary (Last 24 hours) at 07/11/11 1034 Last data filed at 07/10/11 1300  Gross per 24 hour  Intake    360 ml  Output      0 ml  Net    360 ml    Physical Exam: General appearance: alert, cooperative and no distress Lungs: clear to auscultation bilaterally Heart: regular rate and rhythm Rt groin without hematoma   Rate: 58-86 (with post exercise ~120)  Rhythm: NSR, no further VT  Lab Results:  [facility] 07/09/11 0523  WBC 12.0*  HGB 14.0  PLT 329    Basename 07/09/11 0523  NA 137  K 4.1  CL 106  CO2 22  GLUCOSE 99  BUN 11  CREATININE 0.80    Basename 07/11/11 0525 07/10/11 2034  TROPONINI 5.23* 5.62*   Hepatic Function Panel No results found for this basename: PROT,ALBUMIN,AST,ALT,ALKPHOS,BILITOT,BILIDIR,IBILI in the last 72 hours No results found for this basename: CHOL in the last 72 hours No results found for this basename: INR in the last 72 hours  Imaging: Per 1 View CXR recommendations will check 2 View CXR ECC - evolving TWI in inferior Leads.  Minimal Qs.  Assessment/Plan:   Principal Problem:  *STEMI, acute inf. wall with 100% RCA stenosis, DES 07/07/11 Active Problems:  CADprevious MI and previous stents to LAD '01and LCX '04  HTN (hypertension)  Hyperlipidemia  Smoker  NSVT, short runs, 8bts  Cardiomyopathy, 45% at cath  Abnormal glucose  Having another good day today.  Appetite is great & ready to walk.  51 y/o with prior PCI to LAD x2 & LCX-OM x 2 now day 3 s/p Inf-lat-Post STEMI - prox RCA occluded --> s/p PCI with DES.  Had short run of VT yesterday AM associated with  Chest Pressure.  No further Sx.   No further VT.  Feeling stronger by the day.  Exam benign, BP now stable - tolerating Toprol, but will no add ACE-I at present as BP still in <110s.   2 D Echo done - will f/u this PM (unable to do report, but preliminary read indicated preserved EF ~50-55% with mild mid posterior, inferolateral hypokinesis, with also mid inferoseptal Hypokinesis.  On increased Crestor.  Provided he remains stable& ready for discharge today.  DISPO: Will continue Prasugrel  & ASA, agree with increasing home dose of Crestor. -- was quoted $60/month for Prasugrel (will try to get at least 2-3 months of Prasugrel) then can convert to Plavix if $$ is too much. Will schedule f/u in ~2-3 weeks.  May need work note -- no strenuous activity x 3 weeks post MI, gradually increase over weeks 4-6.  Marykay Lex, M.D., M.S. [facility] [address] [place], [place]  [address]  [phone] Pager # [phone]  07/11/2011 10:34 AM

## 2011-07-12 NOTE — Discharge Summary (Signed)
Physician Discharge Summary  Patient ID: Troy Parrish MRN: [id number] DOB/AGE: 10-24-1960 51 y.o.  Admit date: 07/07/2011 Discharge date: 07/12/2011  Admission Diagnoses:  STEMI  Discharge Diagnoses:  Principal Problem:  *STEMI, acute inf. wall with 100% RCA stenosis, DES 07/07/11 Active Problems:  CADprevious MI and previous stents to LAD '01and LCX '04  HTN (hypertension)  Hyperlipidemia  Smoker  NSVT, short runs, 8bts  Cardiomyopathy, 45% at cath  Abnormal glucose   Discharged Condition: stable  Hospital Course:   51 year old male developed substernal chest pain tonight at 2030, significant pain, EMS was called and EKG revealed STEMI with ST elevation in leads, 2, 3 avf with recipacle changes in Lead 1 AVL and V2. Pt. Was pale and diaphoretic. He rec'd asprin and 8 mg. Morphine prior to the cath lab. Pt. Was taken emergently to the cath lab for cardiac cath and PCI.  Previous LAD stent, distal and mid 2001 and LCX stent 2002, previously treated at [facility]. His cardiologist is in [place], [place]. The patient now lives in [place].  The patient was taken urgently to the cath lab which revealed inferolateral/Posterior STEMI with the culprit being a 100% occluded proximal RCA. Successful rapid reperfusion with PTCA and PCI, placing an Integrity Resolute DES 2.75 mm x 18 mm, post-dilated to 3.45mm. Two widely patent LAD stents, and one widely patent Lateral OM stent. Moderately reduced EF with expected mid inferior hypokinesis. Elevated LVEDP following volume.   He had brief NSVT.   Added NTG.  His BP was low so ability to give BB and ACE was limited.  Tobacco cessation counseling provided.  Cardiac rehab initiated.  2D echo revealed an EF of 50-55% and grade one diastolic dysfunction.  Mild hypokinesis of the basal-mid inferolateral myocardium.  Continue Prasugrel & ASA, agree with increasing home dose of Crestor.  Will change to plavix if Effient pricing is prohibitive.  The patient was  discharged in stable condition after being seen by Dr. Herbie Baltimore.   Consults: Tobacco cessation  Significant Diagnostic Studies:   CHEST - 2 VIEW  Comparison: 07/08/2011  Findings: Normal heart size and vascularity. Negative for CHF or  pneumonia. Minimal subsegmental right lower lobe atelectasis. No  focal pneumonia, collapse, consolidation, effusion or pneumothorax.  Trachea midline. Thoracic spondylosis evident.  IMPRESSION:  Minimal right base atelectasis. No acute chest process   Study Conclusions  - Left ventricle: The cavity size was normal. Wall thickness was normal. Systolic function was normal. The estimated ejection fraction was in the range of 50% to 55%. Doppler parameters are consistent with abnormal left ventricular relaxation (grade 1 diastolic dysfunction). - Regional wall motion abnormality: Mild hypokinesis of the basal-mid inferolateral myocardium. Impressions:  - Study suggests mild coronary artery disease, predominantly involving the RCA, status post stent placement. No evidence of valve disease.   Procedures Performed:  Left Heart Catheterization via 6 Fr Right Common Femoral Artery access  Left Ventriculography, (RAO) 12 ml/sec for 25 ml total contrast  Native Coronary Angiography  Percutaneous Coronary Artery Intervention on Proximal 100% occluded RCA with a Integrity Resolute DES 2.75 mm x 18 mm; final diameter 3.1 mm Indication(s): 4-6 MM Inferior (Inferolateral, InferoPosterior) STEMI  Known CAD - with prior STEMI in 2002 - PCI followed by 2nd PCI in 2004 in [place], [place]  Chronic Smoker  Dyslipidemia History: 51 y.o. male chronic smoker with a history of MI in 2002 with PCI followed by PCI in 2004 (has 2 LAD stents and 1 Left Cicumflex-OM stent)  who has been taking Plavix "off an on" since 2004 as well as Crestor & Toprol. He was in his USOH until ~2000 hrs tonight when he began to feel "strange". By ~2030 he knew that something was wrong with  chest heaviness, diaphoresis and shortness of breath with a sense of impending doom. His daughter had already called EMS, who arrived with 1st ECG by 2040hrs. Code STEMI was called for Inferior STEMI. They arrived ad [facility] ER at ~2110 hrs, and he was brought to the catheterization laboratory by 2115 hrs.  He had received 324mg  ASA by EMS along with 8 mg Morphine and 4000 Units IV Heparin bolus. Based upon the ECG, and an initial assessment of the vital signs, this appeared to be a Right Ventricular Infarction, therefore no NTG was administered.  Consent: The procedure with Risks/Benefits/Alternatives and Indications was reviewed with the patient. All questions were answered.  Risks / Complications include, but not limited to: Death, MI, CVA/TIA, VF/VT (with defibrillation), Bradycardia (need for temporary pacer placement), contrast induced nephropathy, bleeding / bruising / hematoma / pseudoaneurysm, vascular or coronary injury (with possible emergent CT or Vascular Surgery), adverse medication reactions, infection.  The patient voiced understanding and agree to proceed.  Unable to obtain consent because of emergent medical necessity. Verbal consent was given.  Procedure: The patient was brought to the 2nd Floor [facility] in the fasting state and prepped and draped in the usual sterile fashion for Right groin access. Sterile technique was used including antiseptics, cap, gloves, gown, hand hygiene, mask and sheet.  Skin prep: Chlorhexidine;  Time Out: Verified patient identification, verified procedure, site/side was marked, verified correct patient position, special equipment/implants available, medications/allergies/relevent history reviewed, required imaging and test results available. Performed  The right femoral head was identified using tactile and fluoroscopic technique. The right groin was anesthetized with 1% subcutaneous Lidocaine. The right Common Femoral Artery was  accessed using the Modified Seldinger Technique with placement of a antimicrobial bonded/coated single lumen (6 Fr) sheath. The sheath was aspirated and flushed.  A 5 Fr JL4 Diagnostic Catheter was advanced of over a Standard J wire into the ascending Aorta and used to engage the Left Coronary Artery. Multiple cineangiographic views of the Left Coronary Artery system were performed.  Next, a 6Fr JR4 Guide Catheter was advanced over a Standard J wire into the ascending Aorta and used to engage the Right Coronary Artery. A single Guiding cineangiographic views of the Right Coronary Artery system were performed -- demonstrating a 100% proximally occluded RCA.  Angiomax bolus was administered and the PCI procedure was initiated -- as described below.  After completion of the PCI, the Guide catheter was exchanged over the J wire for an angled Pigtail catheter that was advanced across the Aortic Valve. LV hemodynamics were measured and Left Ventriculography was performed. LV hemodynamics were then re-sampled, and the catheter was pulled back across the Aortic Valve for measurement of "pull-back" gradient. The catheter and wire removed completely out of the body.  The patient was transferred to the CCU where the sheath is to be removed 2 hours after completion of the 2hr reduced rate Angiomax infusion post PCI. Manual pressure will beheld for hemostasis.  Hemodynamics:  Central Aortic Pressure / Mean Aortic Pressure: 88/52 mmHg; 72 mmHg  LV Pressure / LV End diastolic Pressure: 87/16 mmHg; 29 mmHg  Left Ventriculography:  EF: 45%  Wall Motion: Moderate Mid Inferior Hypokinesis.  Coronary Angiographic Data:  Left Main: Large caliber vessel,  bifurcates into LAD and Left Circumflex; angiographically normal  Left Anterior Descending (LAD): Moderate to large caliber vessel; In the early portion of the mid vessel,following a small D1 and small-moderate caliber SP1 trunk there is a widely patent stent that covers a  smal-moderate D2 and small SP2; there is a second smaller stent in the distal-mid vessel just after a small D3. The remaining LAD tapers to a quite small caliber vessel distally as it courses around to the infero-apex with diffuse mild luminal irregularities.  All 3 diagonal branches are small in caliber with diffuse mild luminal irregularities.  Circumflex (LCx): Moderate to large caliber, non-dominant vessel that gives of a small proximal OM and Atrial branch before bifurcating into a small AV-Groove branch -- terminates as 2 small caliber LPL branchers with minimal luminal irregularities -- and a Moderate caliber OM2.  2nd / Lateral Obtuse Marginal: Moderate caliber vessel with a widely patent proximal stent; courses along the lateral wall and bifurcates distally; minimal luminal irregularities.  posterior lateral branch: Small caliber continuation of the AV-groove branch.  Right Coronary Artery: Proximal 100% thrombotic occlusion at Atrial branch. Post PCI: the mid RCA has a ~40% stenosis, followed by diffuse luminal irregularities; 2 small caliber RV marginal branches, diffusely diseased.  Posterior Descending Artery: actually 2 vessels, 1st is smaller & shorter - diffuse luminal irregularities; 2nd larger caliber with more septal perforators & bifurcates distally - reaches 2/3 the way to the apex, has a mid ~50% lesion, irregular in a ~1.5 mm vessel.  Posterior Lateral System: The Right Posterior-AV-Groove branch gives off 3 RPL branches and the AV-nodal artery, the 2nd branch is largest and the 3rd extends high up the posterolateral wall; diffuse 20-30% luminal irregularties. PERCUTANEOUS CORONARY INTERVENTION PROCEDURE  After reviewing the initial cineangiography images, the culprit lesion(s) was identified, and the decision was made to proceed with percutaneous coronary intervention.  A weight based bolus of IV Angiomax was administered and the drip was continued until completion of the  procedure, with the plan to continue for 2 hours post PCI at the reduced rate of 0.25mg /kg/hr.  Oral Prasugrel 60 mg was administered.  The Guide catheter(s) were advanced over a J-wire and used to engage the right Coronary Artery.  An ACT of > 200 Sec was confirmed prior to advancing the Guidewire.  Lesion #1: Proximal RCA  Pre-PCI Stenosis: 100 % Post-PCI Stenosis: 0 %  TIMI 0 flow TIMI 3 flow  Guide Catheter: 6Fr JR4 Guidewire: BMW - reperfusion occurred with wire  Pre-Dilitation Balloon: Emerge MR 2.0 mm x 12 mm; 1st inflation time: 2140hrs  2 Inflations distal to proximal lesion: 8 Atm for 30 Sec  3rd Inflation mid lesion: 10 Atm for 30 Sec  Scout angiography did not reveal evidence of dissection or perforation; there was a ~85% tubular lesion present after balloon Pre-dilation.  Stent: Integrity Resolute DES 2.75 mm x 18 mm  Deployment: 10 Atm for 30 Sec  2nd Inflation: 12 Atm for 45 Sec  Scout angiography did not reveal evidence of dissection or perforation. The stent was not quite fully expanded to the vessel, therefore a post-dilation balloon was used.  Post-Dilitation Balloon: [place] Quantum Apex MR 3.0 mm x 15 mm  1st Inflation: 15 Atm for 45 Sec  2nd Inflation: 17 Atm for 45 Sec; final diameter - 3.1 mm  Post-deployment cineangiography with and without guidewire in place was performed in multiple orthogonal views demonstrating excellent stent deployment and did not reveal evidence of dissection or perforation.  The patient was transported to the CCU in a chest pain free, hemodynamically stable condition.  The patient was stable before, during and following the procedure. He had intermittent hypotension to the 60s and HRs as low as 40s after reperfusion that recovered with IVF bolus.  Patient did tolerate procedure well.  There were not complications.  EBL: ~54ml Medications:  Premedication: ASA 324mg , Heparin 4000 Units  Sedation: 25 mcg Fentanyl  Contrast: 150 ml Omnipaque    Anticoagulation: Angiomax Bolus and drip - with drip dropped to reduced rate of 0.25mg /kg/hr for 2 hrs post PCI  Prasugrel 60 ml  800 ml NS bolus  Impression:  Inferolateral/Posterior STEMI with the culprit being a 100% occluded proximal RCA.  Successful rapid reperfusion with PTCA and PCI, placing an Integrity Resolute DES 2.75 mm x 18 mm, post-dilated to 3.26mm.  Two widely patent LAD stents, and one widely patent Lateral OM stent.  Moderately reduced EF with expected mid inferior hypokinesis. Elevated LVEDP following volume resuscitation of an RV Infarction. Plan:  Admit to CCU on Fast-Track Pathway given the rapid reperfusion from onset of angina.  Converted to Prasugrel from Plavix, since he was on Plavix prior to this STEMI. Case Management has been consulted.  Continue his statin, and restart BB as his BP recovers.  Smoking cessation counseling has been ordered.  Cardiac Rehab consultation. The case and results was discussed with the patient and his wife. Pre and Post pictures were provided to his wife.  Time Spend Directly with Patient:  75 minutes  Mckenze Slone W, M.D., M.S.  [facility]  [address]  [place], [place] [address]  [phone]   Treatments:  Discharge Exam: Blood pressure 112/73, pulse 75, temperature 98 F (36.7 C), temperature source Oral, resp. rate 18, height 6' (1.829 m), weight 80.241 kg (176 lb 14.4 oz), SpO2 96.00%.   Disposition: 01-Home or Self Care  Discharge Orders    Future Orders Please Complete By Expires   Amb Referral to Cardiac Rehabilitation      Comments:   Referring to [place] phase 2   Diet - low sodium heart healthy      Increase activity slowly      Discharge instructions      Comments:   No strenuous activity for 2-3 weeks.     Medication List  As of 07/12/2011  2:56 PM   STOP taking these medications         clopidogrel 75 MG tablet      metoprolol 50 MG tablet          TAKE these medications         aspirin EC 81 MG tablet   Take 162 mg by mouth at bedtime.      metoprolol succinate 25 MG 24 hr tablet   Commonly known as: TOPROL-XL   Take 1 tablet (25 mg total) by mouth daily.      niacin 500 MG CR tablet   Commonly known as: NIASPAN   Take 500 mg by mouth at bedtime.      prasugrel 10 MG Tabs   Commonly known as: EFFIENT   Take 1 tablet (10 mg total) by mouth daily.      rosuvastatin 20 MG tablet   Commonly known as: CRESTOR   Take 1 tablet (20 mg total) by mouth daily at 6 PM.      Vitamin D (Ergocalciferol) 50000 UNITS Caps   Commonly known as: DRISDOL  Take 50,000 Units by mouth every 7 (seven) days. fridays      zolpidem 10 MG tablet   Commonly known as: AMBIEN   Take 1 tablet (10 mg total) by mouth at bedtime as needed for sleep.           Follow-up Information    Follow up with Bren Steers W, MD. (Our office will call you with the appt. date and time.)    Contact information:   [facility] [address], [address] [place] [address] [phone]          Signed: Wilburt Finlay 07/12/2011, 2:56 PM  Mr. Kliebert looked great day 4 s/p large Inferior (RV) STEMI.   His echo correlated well with the LV Gram - preserved EF with mild mid inferior hypokinesis & possible mid anteroseptal hypokinesis (likely from initial MI in 2002 given LAD stents).  He had no further episodes of VT or CP since 5/31.  He was ready for discharge of the Rx's noted above. I agree with Mr. Jasper Riling summary.  Marykay Lex, M.D., M.S. [facility] [address] [place], [place]  [address]  [phone] Pager # [phone]  07/12/2011 3:43 PM

## 2011-11-09 HISTORY — PX: OTHER SURGICAL HISTORY: SHX169

## 2011-11-09 LAB — PULMONARY FUNCTION TEST

## 2011-11-30 HISTORY — PX: NM MYOVIEW (ARMC HX): HXRAD1857

## 2011-12-18 DIAGNOSIS — R5383 Other fatigue: Secondary | ICD-10-CM | POA: Insufficient documentation

## 2012-03-29 ENCOUNTER — Emergency Department: Payer: Self-pay | Admitting: Emergency Medicine

## 2012-05-01 ENCOUNTER — Encounter: Payer: Self-pay | Admitting: *Deleted

## 2012-05-02 ENCOUNTER — Encounter: Payer: Self-pay | Admitting: Cardiology

## 2012-05-02 ENCOUNTER — Other Ambulatory Visit: Payer: Self-pay | Admitting: Cardiology

## 2012-06-22 ENCOUNTER — Telehealth: Payer: Self-pay | Admitting: Cardiology

## 2012-06-22 ENCOUNTER — Telehealth: Payer: Self-pay | Admitting: Physician Assistant

## 2012-06-22 NOTE — Telephone Encounter (Signed)
Forwarded to Jasmine December, RN for Dr. Herbie Baltimore.

## 2012-06-22 NOTE — Telephone Encounter (Addendum)
Wants lab results from  The end of April!

## 2012-06-22 NOTE — Telephone Encounter (Signed)
Left message on voicemail to call back on 06/26/2012.

## 2012-06-22 NOTE — Telephone Encounter (Signed)
Paper chart requested.

## 2012-06-22 NOTE — Telephone Encounter (Signed)
Gave chart to [facility].

## 2012-06-26 ENCOUNTER — Encounter: Payer: Self-pay | Admitting: Cardiology

## 2012-06-26 NOTE — Telephone Encounter (Signed)
Message forwarded to S. Martin, RN.  

## 2012-06-26 NOTE — Telephone Encounter (Signed)
Wants lab results from the end of April-closed message sent at 12:02 in error!

## 2012-06-26 NOTE — Telephone Encounter (Signed)
Error - duplicate message

## 2012-06-26 NOTE — Telephone Encounter (Signed)
Troy Parrish, pt is returning your call from Friday concerning lab results.

## 2012-06-26 NOTE — Telephone Encounter (Signed)
Still waiting to get lab results from the end of April!

## 2012-06-27 ENCOUNTER — Telehealth: Payer: Self-pay | Admitting: Cardiology

## 2012-06-27 MED ORDER — ALBUTEROL SULFATE HFA 108 (90 BASE) MCG/ACT IN AERS: 2.0000 | INHALATION_SPRAY | Freq: Two times a day (BID) | RESPIRATORY_TRACT | Status: DC

## 2012-06-27 MED ORDER — FLUTICASONE-SALMETEROL 250-50 MCG/DOSE IN AEPB: 1.0000 | INHALATION_SPRAY | Freq: Two times a day (BID) | RESPIRATORY_TRACT | Status: DC

## 2012-06-27 NOTE — Telephone Encounter (Signed)
Pt said he was told this morning that a prescription was going to be called in _checked with [facility]-it still was not there!

## 2012-06-27 NOTE — Telephone Encounter (Signed)
Notified patient medications were at the correct pharmacy now [facility]. Informed patient Dr Herbie Baltimore states start with PCP before seeing Pulm.

## 2012-06-27 NOTE — Telephone Encounter (Signed)
I do think a PCP would be best first option, especially since I am not familiar with either PCP or pulmonologist in [place].  I will be happy to Rx his pulmonary medications until this is established.

## 2012-06-27 NOTE — Telephone Encounter (Signed)
[name]. Per Dr Herbie Baltimore order Advair and Albuterol for patient. Per CPET test-- PFT'S showed probable COPD.  Pt. States he does not have PCP. He wanted to know if Dr Herbie Baltimore needed to refer to PCP or Pulm.  Inform pt that a PCP could handle the problem and refer if necessary but [name].   Troy Parrish wanted results of labwork from 05/2012 . Received copy of labs for Dr. Herbie Baltimore to review,will contact patient once Herbie Baltimore gives instruction.

## 2012-06-27 NOTE — Addendum Note (Signed)
Addended by: Tobin Chad on: 06/27/2012 11:01 AM   Modules accepted: Orders

## 2012-06-27 NOTE — Telephone Encounter (Signed)
Message forwarded to S. Martin, RN.  

## 2012-08-15 ENCOUNTER — Other Ambulatory Visit: Payer: Self-pay | Admitting: *Deleted

## 2012-08-15 ENCOUNTER — Telehealth: Payer: Self-pay | Admitting: *Deleted

## 2012-08-15 MED ORDER — ROSUVASTATIN CALCIUM 20 MG PO TABS: 20.0000 mg | ORAL_TABLET | Freq: Every day | ORAL | Status: DC

## 2012-08-15 MED ORDER — METOPROLOL SUCCINATE ER 25 MG PO TB24: 25.0000 mg | ORAL_TABLET | Freq: Every day | ORAL | Status: DC

## 2012-08-15 NOTE — Telephone Encounter (Signed)
Sent medication refills to pharmacy electronically on 08/15/12

## 2012-08-17 ENCOUNTER — Other Ambulatory Visit: Payer: Self-pay | Admitting: Pharmacist Clinician (PhC)/ Clinical Pharmacy Specialist

## 2012-11-28 ENCOUNTER — Encounter: Payer: Self-pay | Admitting: Cardiology

## 2012-11-28 DIAGNOSIS — I5189 Other ill-defined heart diseases: Secondary | ICD-10-CM | POA: Insufficient documentation

## 2012-11-30 ENCOUNTER — Ambulatory Visit: Payer: BC Managed Care – PPO | Admitting: Cardiology

## 2012-12-15 ENCOUNTER — Ambulatory Visit (INDEPENDENT_AMBULATORY_CARE_PROVIDER_SITE_OTHER): Payer: BC Managed Care – PPO | Admitting: Cardiology

## 2012-12-15 ENCOUNTER — Encounter: Payer: Self-pay | Admitting: Cardiology

## 2012-12-15 VITALS — BP 130/92 | HR 93 | Ht 72.0 in | Wt 183.0 lb

## 2012-12-15 DIAGNOSIS — I255 Ischemic cardiomyopathy: Secondary | ICD-10-CM

## 2012-12-15 DIAGNOSIS — I2510 Atherosclerotic heart disease of native coronary artery without angina pectoris: Secondary | ICD-10-CM

## 2012-12-15 DIAGNOSIS — J449 Chronic obstructive pulmonary disease, unspecified: Secondary | ICD-10-CM

## 2012-12-15 DIAGNOSIS — I472 Ventricular tachycardia, unspecified: Secondary | ICD-10-CM

## 2012-12-15 DIAGNOSIS — J4489 Other specified chronic obstructive pulmonary disease: Secondary | ICD-10-CM

## 2012-12-15 DIAGNOSIS — I2119 ST elevation (STEMI) myocardial infarction involving other coronary artery of inferior wall: Secondary | ICD-10-CM

## 2012-12-15 DIAGNOSIS — F17200 Nicotine dependence, unspecified, uncomplicated: Secondary | ICD-10-CM

## 2012-12-15 DIAGNOSIS — I10 Essential (primary) hypertension: Secondary | ICD-10-CM

## 2012-12-15 DIAGNOSIS — R5381 Other malaise: Secondary | ICD-10-CM

## 2012-12-15 DIAGNOSIS — Z9861 Coronary angioplasty status: Secondary | ICD-10-CM

## 2012-12-15 DIAGNOSIS — I219 Acute myocardial infarction, unspecified: Secondary | ICD-10-CM

## 2012-12-15 DIAGNOSIS — I5189 Other ill-defined heart diseases: Secondary | ICD-10-CM

## 2012-12-15 DIAGNOSIS — I213 ST elevation (STEMI) myocardial infarction of unspecified site: Secondary | ICD-10-CM

## 2012-12-15 DIAGNOSIS — E785 Hyperlipidemia, unspecified: Secondary | ICD-10-CM

## 2012-12-15 DIAGNOSIS — I2589 Other forms of chronic ischemic heart disease: Secondary | ICD-10-CM

## 2012-12-15 DIAGNOSIS — G4733 Obstructive sleep apnea (adult) (pediatric): Secondary | ICD-10-CM

## 2012-12-15 DIAGNOSIS — R5383 Other fatigue: Secondary | ICD-10-CM

## 2012-12-15 DIAGNOSIS — I2109 ST elevation (STEMI) myocardial infarction involving other coronary artery of anterior wall: Secondary | ICD-10-CM

## 2012-12-15 MED ORDER — METOPROLOL SUCCINATE ER 25 MG PO TB24: ORAL_TABLET | ORAL | Status: DC

## 2012-12-15 MED ORDER — LOSARTAN POTASSIUM 25 MG PO TABS: 25.0000 mg | ORAL_TABLET | Freq: Every day | ORAL | Status: DC

## 2012-12-15 NOTE — Patient Instructions (Addendum)
Start LOSARTAN 25 MG AFTER THE 2nd week decrease Metoprolol 12.5 mg (1/2 tablet) a day   You have been referred to  Dr Craige Cotta - FOLLOW  OBST. SLEEP APNEA, AND COPD  Your physician wants you to follow-up in 3  MONTHS Dr Herbie Baltimore.  You will receive a reminder letter in the mail two months in advance. If you don't receive a letter, please call our office to schedule the follow-up appointment.

## 2012-12-15 NOTE — Progress Notes (Signed)
PATIENT: Troy Parrish MRN: [id number]  DOB: 07-03-1960   DOV:12/17/2012 PCP: Oswaldo Conroy, MD  Clinic Note: Chief Complaint  Patient presents with  . ROV 6 months    C/o elevated BP, lightheadedness, and nausea x1 week, SOB-COPD. Some pain in R shoulder x4-5 days-when laying on the R side.    HPI: Troy Parrish is a 52 y.o. male with a PMH below who presents today for six-month followup. I last saw him in the beginning of May in followup from his CPET test on which he did quite poorly with a peak VO2 of 53% initially. A followup study this spring showed significant improvement with better overall effort and a peak VO2 increased to 63%. He was only able to get his heart rate up to 81% of predicted showing some chronotropic incompetence. The other is seen, and it was that his pulmonary function tests were quite low, with FVC, and FEV1 consistent with COPD.  CARDIAC HISTORY:   Anterior MI with a PCI to the LAD in 2001.   Unstable Angina 2006: LCx-OM1 stenosis --> PCI with 2.5-x-13-mm Cypher DES by Dr. Elson Clan in [place], [employer].   His former cardiologist was Dr. Derwood Kaplan.     He subsequently moved to [place], [place].   Inferolateral- Posterior STEMI in May 2013: occluded RCA treated --> Integrity Resolute DES 2.75-x-18-mm stent (postdilated to 3.1 mm)  EF by Echo: 50% to 55% with grade 1 diastolic dysfunction and mild hypokinesis of the inferolateral myocardium.  Non-Invasive Evaluations:  CPET-MET tctober 1, 2013:  submaximal effort reaching only 1.05 RER as opposed to 1.09 and with this, his peak VO2 was 51% -> borderline severe. He was noted to have chronotropic incompetence with his heart rate only going from the low 80s up to roughly 100. For that, his beta blocker was decreased.    TM Myoview Nov 30, 2011: 9 min; 10 METS; HR up to 169 bpm.  Medium-size moderate-intensity perfusion defect in the basal inferolateral, mid inferolateral walls with minimal reversibility  suggestive of an old infarct with mild peri-infarct ischemia. Read as a low-risk scan.   Inferolateral Infarction would go along with known RCA & Cx territory disease/MIs.  CPET-MET 2014: Peak VO2 63%, better HR response.  Interval History: Troy Parrish is here today, and actually a pretty good mood. He still has exertional dyspnea but really hasn't down. His he'll not working, and would sometimes lock himself in his room because of shame.  He has not had any chest tightness or pressure with rest or exertion since his last PCI. His chest gets short of breath and fatigue doing just about anything. He didn't really well as smoking cessation but now is back to smoking a pack a day. He is using the AndroGel when necessary, mostly just improve his energy. He also notes his blood pressures gotten somewhat elevated over the last month or 2. It started getting headache and a little more short of breath, almost to the or pressure in his chest when his blood pressure go higher. I summary record was in the 160s systolic and 100 diastolic. He took it upon himself to restart his metoprolol, noted but the blood pressure improved he felt better.  Otherwise he denies any significant palpitations or rapid heart beats. No significant orthostatic symptoms. No lightheadedness, dizziness weakness or syncope/near-syncope. No TIA or amaurosis fugax symptoms. No melena, hematochezia or hematuria. No claudication symptoms.  Past Medical History  Diagnosis Date  . HTN (hypertension) 07/07/2011  . History  of colon polyps   . Hyperlipidemia 07/07/2011  . Recurrent upper respiratory infection (URI)   . CAD S/P percutaneous coronary angioplasty 07/07/2011    Has had PCI to all 3 major vessels  . ST elevation myocardial infarction (STEMI) of anterior wall 2001    History of; prior cardiologist was Dr. Derwood Kaplan in [place]  . ST elevation myocardial infarction (STEMI) of inferior wall 07/07/2011    Occluded RCA - Integrity  Resolute DES 2.75 mm 18 mm (3.1 mm)  . Unstable angina  2006    Cx-OM - PCI 2.5 mm 13 mm Cypher DES Jinny Blossom, [place])     Prior Cardiac Evaluation and Past Surgical History: Past Surgical History  Procedure Laterality Date  . Transthoracic echocardiogram  07/10/2011    EF 50-55% ,LV graded 1 diastolic dysf.   . Nuclear exercise study  11/30/2011    EF 45% ,exercise 10 METS, infarct\scar w mild perinfarct ischemia -basal inferolat and mid inferolat region  . Met/cpet  11/09/2011    submax. effort 1.05 RER peak V02 was 51% ,chronotropic incomp.hrt rate lows 80s to 100  . Cardiac catheterization  07/07/2011     inferolat post LV infarct ST-elevation MI  . Coronary angioplasty with stent placement  06/2011    occluded RCA ,Integrity Resolute DES 2.75 x 18 mm stent dilated 3.1 mm  . Coronary angioplasty with stent placement  2001    ANTERIOR mi with a  PCI to LAD   . Coronary angioplasty with stent placement  2006    [place] by Dr Elson Clan -lesion lft circ/OM1 with 2.5 x 13mm Cypher DES    No Known Allergies  Current Outpatient Prescriptions  Medication Sig Dispense Refill  . albuterol (PROVENTIL HFA;VENTOLIN HFA) 108 (90 BASE) MCG/ACT inhaler Inhale 2 puffs into the lungs 2 (two) times daily.  1 Inhaler  3  . aspirin EC 81 MG tablet Take 162 mg by mouth at bedtime.      . Fluticasone-Salmeterol (ADVAIR DISKUS) 250-50 MCG/DOSE AEPB Inhale 1 puff into the lungs 2 (two) times daily.  60 each  6  . furosemide (LASIX) 20 MG tablet Take 20 mg by mouth daily.      . metoprolol succinate (TOPROL-XL) 25 MG 24 hr tablet Take 1/2 tab daily  30 tablet  6  . niacin (NIASPAN) 500 MG CR tablet Take 500 mg by mouth at bedtime.      . prasugrel (EFFIENT) 10 MG TABS tablet Take 1 tablet (10 mg total) by mouth daily.  28 tablet  0  . rosuvastatin (CRESTOR) 20 MG tablet Take 1 tablet (20 mg total) by mouth daily at 6 PM.  30 tablet  6  . triamcinolone (KENALOG) 0.025 % ointment Apply 1 application  topically 2 (two) times daily.      Marland Kitchen losartan (COZAAR) 25 MG tablet Take 1 tablet (25 mg total) by mouth daily.  90 tablet  3  . zolpidem (AMBIEN) 10 MG tablet Take 1 tablet (10 mg total) by mouth at bedtime as needed for sleep.  30 tablet  0   No current facility-administered medications for this visit.   History   Social History Narrative   He is a married father of 8, grandfather of 8. He does not really get routine exercise.    He is down to 5 or 6 cigarettes a day. He is very seriously wanting to quit. This is a significant cutback for him, but he has pretty much been told he needs  to stop by his wife, and so he fully intends to do so. He is willing to try the patches or whatever. He has a social alcoholic beverage every now and then.     ROS: A comprehensive Review of Systems - Negative except Exertional dyspnea noted above, but no significant daytime coughing. General ROS: positive for  - fatigue, malaise and sleep disturbance negative for - chills, fever, hot flashes, night sweats, weight gain or weight loss Psychological ROS: positive for - decreased libido, depression, mood swings, sleep disturbances and Decrease sense of self worth, lack of appetite, anhedonia negative for - behavioral disorder, concentration difficulties or suicidal ideation  PHYSICAL EXAM BP 130/92  Pulse 93  Ht 6' (1.829 m)  Wt 183 lb (83.008 kg)  BMI 24.81 kg/m2 General appearance: alert, cooperative, appears stated age, no distress and He actually has more color than usual today and in somewhat better spirits. Well-nourished and well-groomed Neck: no adenopathy, no carotid bruit, no JVD and supple, symmetrical, trachea midline Lungs: diminished breath sounds bilaterally, wheezes bilaterally and Otherwise the most part CPAP. Nonlabored. Increased AP diameter. Prolonged expiratory phase Heart: regular rate and rhythm, S1, S2 normal, no murmur, click, rub or gallop and normal apical impulse Abdomen: soft,  non-tender; bowel sounds normal; no masses,  no organomegaly and Mildly protuberant Extremities: extremities normal, atraumatic, no cyanosis or edema Pulses: 2+ and symmetric Neurologic: Mental status: Alert, oriented, thought content appropriate, affect: blunted and mood-congruent Cranial nerves: normal  WUJ:[id number] today: Yes Rate: 93 , Rhythm: NSR; low-voltage in limb leads; otherwise normal ECG;  Recent Labs: None recorded  ASSESSMENT / PLAN: CAD S/P percutaneous coronary angioplasty Actually from a cardiac standpoint he seems to be doing relatively well and stable. He did not do well on Plavix so he is still on Prasugrel plus aspirin. Partly because of the amount of stent he had and the fact that he had in-stent restenosis, I think it's best for him to maintain dual antiplatelet therapy. He restarted some metoprolol, which I will decrease to one half the dose, but add an ARB for additional blood pressure control. He is also on statin.   Cardiomyopathy, ischemic; EF 50-55% with hypokinesis of the inferior and inferolateral myocardium Despite his low normal EF, he has not had any real heart type symptoms. Any edema is well-controlled with his Lasix. He is on a beta locker on starting an ARB.  STEMI, acute inf. wall with 100% RCA stenosis, DES 07/07/11 (prior Anterior STEMI in 2001) Although he had the anterior MI back in 2001 with LAD stent, the only sign of infarction on his stress test was related to the RCA infarct. His EF is slightly reduced, but relatively preserved. Based on the fact that he has had 2 ST elevation MI in 1 unstable angina/mild non-STEMI in his past, we determined that it was best for him to stay on dual antiplatelet therapy lifelong. He is also on good medical therapy. Unfortunately he has gone back to smoking.  Fatigue Quite a lot of his overall issues are now related to this general sense of fatigue and tiredness with shortness of breath. I think it is  multifactorial.  Most notably, I think he has significant symptoms for depression the not unexpected after an MI, however to include a long time for him to overcome. This is probably also related to the fact that he is unemployed, and feels a decreased sense of self worth being unable to provide for his family. Not only does not  have a job, he does not feel like he can do much at the because of his dyspnea.  I strongly recommended that he talk with his primary provider about starting an antidepressant agent. Hopefully that'll also help reduce the smoking cravings. He also has quite poor sleep habits, this is partly was related to depression, but may also be related to sleep apnea. His wife is here with him today describes snoring, and tossing around.  OSA (obstructive sleep apnea) I am concerned that a lot of what he is describing as far as his sleeping difficulties could partly be related to OSA. He probably would warrant a sleep study evaluation. My plan was to refer him to Dr. Craige Cotta from Pulmonary Medicine for both OSA evaluation and management of COPD.  COPD (chronic obstructive pulmonary disease) His PFTs from the CPET evaluations clearly reveal COPD. I started him on albuterol and Advair. I think he feels like he would very much want to see a pulmonologist for a second opinion on how to manage his COPD, because nothing has improved since the initial improvement after starting these medications. He does acknowledge that smoking is only make it worse.  Plan: Refer to Pulmonary Medicine, see above.  HTN (hypertension) He notices blood pressures are getting a bit higher than they had been. I think it's reasonable to start an ARB.  With his heart rate up in the 90s today, and doing better on the overall exercise level on his recent stress test, it would be nice to low dose beta blocker on board with his history of Nonsustained V. Tach post MI  -- with a large inferolateral scar  Hyperlipidemia He  is on statin, and he does sit his PCP his been following his lipid levels.  Smoker After an hour a long visit, I did spend an additional 5-7 minutes talking to him about how important it is to get back on the wagon with his smoking cessation. He constantly uses the excuse of the social stresses that he is having daily. Finally after having a conversation about what is depression, I convinced him that he needs to talk to her primary physician about getting an SSRI or other antidepressant agent. Hopefully in part to help with some of his cravings and psychosocial pressures.  Chronotropic incompetence with left ventricular dysfunction --> improved with reduction of beta blocker Sort of a double edge sword with his chronotropic incompetence and ventricular tachycardia post MI with a large scar. I think it will have Toprol should be adequate for reducing early complications but not too high to worsen cause chronotropic incompetence.     Orders Placed This Encounter  Procedures  . Ambulatory referral to Pulmonology    Referral Priority:  Routine    Referral Type:  Consultation    Referral Reason:  Specialty Services Required    Referred to Provider:  Coralyn Helling, MD    Requested Specialty:  Pulmonary Disease    Number of Visits Requested:  1  . EKG 12-Lead   Meds ordered this encounter  Medications  . triamcinolone (KENALOG) 0.025 % ointment    Sig: Apply 1 application topically 2 (two) times daily.  . metoprolol succinate (TOPROL-XL) 25 MG 24 hr tablet    Sig: Take 1/2 tab daily    Dispense:  30 tablet    Refill:  6  . losartan (COZAAR) 25 MG tablet    Sig: Take 1 tablet (25 mg total) by mouth daily.    Dispense:  90 tablet  Refill:  3  . prasugrel (EFFIENT) 10 MG TABS tablet    Sig: Take 1 tablet (10 mg total) by mouth daily.    Dispense:  28 tablet    Refill:  0    LOT [id number] Exp 07/2013    Followup: 3 months  DAVID W. Herbie Baltimore, M.D., M.S. [facility] [address] [place], [place]  [address]  [phone] Pager # [phone]

## 2012-12-17 DIAGNOSIS — G4733 Obstructive sleep apnea (adult) (pediatric): Secondary | ICD-10-CM | POA: Insufficient documentation

## 2012-12-17 DIAGNOSIS — J449 Chronic obstructive pulmonary disease, unspecified: Secondary | ICD-10-CM | POA: Insufficient documentation

## 2012-12-17 MED ORDER — PRASUGREL HCL 10 MG PO TABS: 10.0000 mg | ORAL_TABLET | Freq: Every day | ORAL | Status: DC

## 2012-12-17 NOTE — Assessment & Plan Note (Signed)
He is on statin, and he does sit his PCP his been following his lipid levels.

## 2012-12-17 NOTE — Assessment & Plan Note (Signed)
I am concerned that a lot of what he is describing as far as his sleeping difficulties could partly be related to OSA. He probably would warrant a sleep study evaluation. My plan was to refer him to Dr. Craige Cotta from Pulmonary Medicine for both OSA evaluation and management of COPD.

## 2012-12-17 NOTE — Assessment & Plan Note (Signed)
Sort of a double edge sword with his chronotropic incompetence and ventricular tachycardia post MI with a large scar. I think it will have Toprol should be adequate for reducing early complications but not too high to worsen cause chronotropic incompetence.

## 2012-12-17 NOTE — Assessment & Plan Note (Addendum)
Despite his low normal EF, he has not had any real heart type symptoms. Any edema is well-controlled with his Lasix. He is on a beta locker on starting an ARB.

## 2012-12-17 NOTE — Assessment & Plan Note (Signed)
Although he had the anterior MI back in 2001 with LAD stent, the only sign of infarction on his stress test was related to the RCA infarct. His EF is slightly reduced, but relatively preserved. Based on the fact that he has had 2 ST elevation MI in 1 unstable angina/mild non-STEMI in his past, we determined that it was best for him to stay on dual antiplatelet therapy lifelong. He is also on good medical therapy. Unfortunately he has gone back to smoking.

## 2012-12-17 NOTE — Assessment & Plan Note (Addendum)
Actually from a cardiac standpoint he seems to be doing relatively well and stable. He did not do well on Plavix so he is still on Prasugrel plus aspirin. Partly because of the amount of stent he had and the fact that he had in-stent restenosis, I think it's best for him to maintain dual antiplatelet therapy. He restarted some metoprolol, which I will decrease to one half the dose, but add an ARB for additional blood pressure control. He is also on statin.

## 2012-12-17 NOTE — Assessment & Plan Note (Signed)
He notices blood pressures are getting a bit higher than they had been. I think it's reasonable to start an ARB.  With his heart rate up in the 90s today, and doing better on the overall exercise level on his recent stress test, it would be nice to low dose beta blocker on board with his history of Nonsustained V. Tach post MI  -- with a large inferolateral scar

## 2012-12-17 NOTE — Assessment & Plan Note (Addendum)
Quite a lot of his overall issues are now related to this general sense of fatigue and tiredness with shortness of breath. I think it is multifactorial.  Most notably, I think he has significant symptoms for depression the not unexpected after an MI, however to include a long time for him to overcome. This is probably also related to the fact that he is unemployed, and feels a decreased sense of self worth being unable to provide for his family. Not only does not have a job, he does not feel like he can do much at the because of his dyspnea.  I strongly recommended that he talk with his primary provider about starting an antidepressant agent. Hopefully that'll also help reduce the smoking cravings. He also has quite poor sleep habits, this is partly was related to depression, but may also be related to sleep apnea. His wife is here with him today describes snoring, and tossing around.

## 2012-12-17 NOTE — Assessment & Plan Note (Signed)
After an hour a long visit, I did spend an additional 5-7 minutes talking to him about how important it is to get back on the wagon with his smoking cessation. He constantly uses the excuse of the social stresses that he is having daily. Finally after having a conversation about what is depression, I convinced him that he needs to talk to her primary physician about getting an SSRI or other antidepressant agent. Hopefully in part to help with some of his cravings and psychosocial pressures.

## 2012-12-17 NOTE — Assessment & Plan Note (Signed)
His PFTs from the CPET evaluations clearly reveal COPD. I started him on albuterol and Advair. I think he feels like he would very much want to see a pulmonologist for a second opinion on how to manage his COPD, because nothing has improved since the initial improvement after starting these medications. He does acknowledge that smoking is only make it worse.  Plan: Refer to Pulmonary Medicine, see above.

## 2013-03-14 ENCOUNTER — Ambulatory Visit (INDEPENDENT_AMBULATORY_CARE_PROVIDER_SITE_OTHER): Payer: BC Managed Care – PPO | Admitting: Pulmonary Disease

## 2013-03-14 ENCOUNTER — Encounter: Payer: Self-pay | Admitting: Pulmonary Disease

## 2013-03-14 ENCOUNTER — Ambulatory Visit (INDEPENDENT_AMBULATORY_CARE_PROVIDER_SITE_OTHER)
Admission: RE | Admit: 2013-03-14 | Discharge: 2013-03-14 | Disposition: A | Discharge status: Home / Self Care | Payer: BC Managed Care – PPO | Source: Ambulatory Visit | Attending: Pulmonary Disease | Admitting: Pulmonary Disease

## 2013-03-14 VITALS — BP 138/98 | HR 98 | Temp 98.0°F | Ht 71.0 in | Wt 190.0 lb

## 2013-03-14 DIAGNOSIS — J441 Chronic obstructive pulmonary disease with (acute) exacerbation: Secondary | ICD-10-CM

## 2013-03-14 DIAGNOSIS — F17200 Nicotine dependence, unspecified, uncomplicated: Secondary | ICD-10-CM

## 2013-03-14 DIAGNOSIS — J449 Chronic obstructive pulmonary disease, unspecified: Secondary | ICD-10-CM

## 2013-03-14 DIAGNOSIS — Z720 Tobacco use: Secondary | ICD-10-CM | POA: Insufficient documentation

## 2013-03-14 DIAGNOSIS — G4733 Obstructive sleep apnea (adult) (pediatric): Secondary | ICD-10-CM

## 2013-03-14 MED ORDER — ALBUTEROL SULFATE HFA 108 (90 BASE) MCG/ACT IN AERS: 2.0000 | INHALATION_SPRAY | Freq: Two times a day (BID) | RESPIRATORY_TRACT | Status: DC

## 2013-03-14 NOTE — Assessment & Plan Note (Signed)
He reports snoring, sleep disruption, apnea, and daytime sleepiness.  He has history of hypertension, CAD, and COPD.  I am concerned he could have sleep apnea.  We discussed how sleep apnea can affect various health problems including risks for hypertension, cardiovascular disease, and diabetes.  We also discussed how sleep disruption can increase risks for accident, such as while driving.  Weight loss as a means of improving sleep apnea was also reviewed.  Additional treatment options discussed were CPAP therapy, oral appliance, and surgical intervention.  To further assess will arrange for in lab sleep study.

## 2013-03-14 NOTE — Assessment & Plan Note (Signed)
Explained to him how smoking is affecting his health.  Discussed options to assist with smoking cessation.

## 2013-03-14 NOTE — Assessment & Plan Note (Signed)
He has evidence for airflow obstruction on recent spirometry.  He has inconsistent response to trial of inhaler therapy.  He can continue prn albuterol therapy for now.  Will defer restarting advair.  Will arrange for full PFT to further assess.  Depending on results will likely also need to check A1AT levels at some point.

## 2013-03-14 NOTE — Patient Instructions (Signed)
Continue albuterol two puffs up to four times per day as needed for cough, wheeze, or chest congestion Can stop advair Chest xray today Will schedule breathing test (PFT) Will arrange for sleep study Will call to arrange for follow up after sleep study reviewed

## 2013-03-14 NOTE — Progress Notes (Deleted)
   Subjective:    Patient ID: Troy Parrish, male    DOB: 01-Nov-1960, 53 y.o.   MRN: [id number]  HPI    Review of Systems  Constitutional: Positive for appetite change. Negative for fever, chills, diaphoresis, activity change, fatigue and unexpected weight change.  HENT: Positive for dental problem. Negative for congestion, ear discharge, ear pain, facial swelling, hearing loss, mouth sores, nosebleeds, postnasal drip, rhinorrhea, sinus pressure, sneezing, sore throat, tinnitus, trouble swallowing and voice change.   Eyes: Negative for photophobia, discharge, itching and visual disturbance.  Respiratory: Positive for cough and shortness of breath. Negative for apnea, choking, chest tightness, wheezing and stridor.   Cardiovascular: Negative for chest pain, palpitations and leg swelling.  Gastrointestinal: Negative for nausea, vomiting, abdominal pain, constipation, blood in stool and abdominal distention.       Acid Heartburn Indigestion  Genitourinary: Negative for dysuria, urgency, frequency, hematuria, flank pain, decreased urine volume and difficulty urinating.  Musculoskeletal: Positive for arthralgias. Negative for back pain, gait problem, joint swelling, myalgias, neck pain and neck stiffness.  Skin: Positive for rash (Itching). Negative for color change and pallor.  Neurological: Positive for headaches. Negative for dizziness, tremors, seizures, syncope, speech difficulty, weakness, light-headedness and numbness.  Hematological: Negative for adenopathy. Does not bruise/bleed easily.  Psychiatric/Behavioral: Negative for confusion, sleep disturbance and agitation. The patient is not nervous/anxious.        Objective:   Physical Exam        Assessment & Plan:

## 2013-03-14 NOTE — Progress Notes (Signed)
Chief Complaint  Patient presents with  . Sleep Consult    referred by Dr. Ellyn Hack for sleep issues. Epworth Score: 9.    History of Present Illness: Troy Parrish is a 53 y.o. male smoker for evaluation of OSA and COPD.  He is followed by cardiology for his coronary artery disease.  There has been concern for years about his trouble sleeping and possible sleep apnea.  He had recent CPET and spirometry showed obstruction.  He has extensive history of smoking.  He was advised to have further evaluation by pulmonary for OSA and COPD.  He started smoking at age 52.  He smoked up to 2 packs per day, and is now smoking 10 cigarettes per day.  He gets occasional episodes of wheezing and cough.  He does not usually bring up sputum.  He denies history of asthma, but does occasionally gets allergies.  He does okay with activity on level ground, but gets winded when lifting groceries.  He is not usually very active.  He had pneumonia years ago, but denies any exposure to tuberculosis.  He is from [place] originally, but has lived in [place] for 12 years.  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  He has a International aid/development worker.  His grandfather had emphysema.  He was tried on advair and albuterol.  Albuterol helps when he has symptoms.  He did not feel like advair helped >> he is no longer using this.  He has been told he snores, and this has been getting worse.  He wakes up feeling choked, and this happens more on his back.  His weight has been steady. Rt shoulder pain Was on sleeping pills zolpidem, helped >> not using now Coffee in am 2 cups most  He goes to sleep at 11 pm.  He falls asleep after 20 minutes.  He wakes up several times, but usually falls back to sleep quickly.  He gets out of bed at 730 am.  He feels okay in the morning.  He denies morning headache.  He was previously on [place].  He drinks 1 to 2 cups of coffee in the morning.  He denies sleep walking, sleep talking,  bruxism, or nightmares.  There is no history of restless legs.  He denies sleep hallucinations, sleep paralysis, or cataplexy.  The Epworth score is 9 out of 24.  Tests: Echo 07/10/11 >> EF 50 to 56%, grade 1 diastolic dysfx Spirometry 11/09/11 >> FEV1 3.26 (67%), FEV1% 67  Troy Parrish  has a past medical history of HTN (hypertension) (07/07/2011); History of colon polyps; Hyperlipidemia (07/07/2011); Recurrent upper respiratory infection (URI); CAD S/P percutaneous coronary angioplasty (07/07/2011); ST elevation myocardial infarction (STEMI) of anterior wall (2001); ST elevation myocardial infarction (STEMI) of inferior wall (07/07/2011); and Unstable angina ( 2006).  Troy Parrish  has past surgical history that includes transthoracic echocardiogram (07/10/2011); nuclear exercise study (11/30/2011); MET/CPET (11/09/2011); Cardiac catheterization (07/07/2011); Coronary angioplasty with stent (06/2011); Coronary angioplasty with stent (2001); and Coronary angioplasty with stent (2006).  Prior to Admission medications   Medication Sig Start Date End Date Taking? Authorizing Provider  albuterol (PROVENTIL HFA;VENTOLIN HFA) 108 (90 BASE) MCG/ACT inhaler Inhale 2 puffs into the lungs 2 (two) times daily. 06/27/12  Yes Leonie Man, MD  aspirin EC 81 MG tablet Take 162 mg by mouth at bedtime.   Yes Historical Provider, MD  Fluticasone-Salmeterol (ADVAIR DISKUS) 250-50 MCG/DOSE AEPB Inhale 1 puff into the lungs 2 (two)  times daily. 06/27/12  Yes Leonie Man, MD  furosemide (LASIX) 20 MG tablet Take 20 mg by mouth daily.   Yes Historical Provider, MD  losartan (COZAAR) 25 MG tablet Take 1 tablet (25 mg total) by mouth daily. 12/15/12  Yes Leonie Man, MD  metoprolol succinate (TOPROL-XL) 25 MG 24 hr tablet Take 1/2 tab daily 12/15/12  Yes Leonie Man, MD  niacin (NIASPAN) 500 MG CR tablet Take 500 mg by mouth at bedtime.   Yes Historical Provider, MD  prasugrel (EFFIENT) 10 MG TABS tablet Take 1  tablet (10 mg total) by mouth daily. 12/15/12  Yes Leonie Man, MD  rosuvastatin (CRESTOR) 20 MG tablet Take 1 tablet (20 mg total) by mouth daily at 6 PM. 08/15/12 08/15/13 Yes Leonie Man, MD  triamcinolone (KENALOG) 0.025 % ointment Apply 1 application topically 2 (two) times daily.   Yes Historical Provider, MD  zolpidem (AMBIEN) 10 MG tablet Take 1 tablet (10 mg total) by mouth at bedtime as needed for sleep. 07/11/11 03/14/14 Yes Tarri Fuller, PA-C    No Known Allergies  His family history includes Coronary artery disease in his father.  He  reports that he has been smoking Cigarettes.  He has a 20 pack-year smoking history. He has never used smokeless tobacco. He reports that he drinks alcohol. He reports that he does not use illicit drugs.  Review of Systems  Constitutional: Positive for appetite change. Negative for fever, chills, diaphoresis, activity change, fatigue and unexpected weight change.  HENT: Positive for dental problem. Negative for congestion, ear discharge, ear pain, facial swelling, hearing loss, mouth sores, nosebleeds, postnasal drip, rhinorrhea, sinus pressure, sneezing, sore throat, tinnitus, trouble swallowing and voice change.   Eyes: Negative for photophobia, discharge, itching and visual disturbance.  Respiratory: Positive for cough and shortness of breath. Negative for apnea, choking, chest tightness, wheezing and stridor.   Cardiovascular: Negative for chest pain, palpitations and leg swelling.  Gastrointestinal: Negative for nausea, vomiting, abdominal pain, constipation, blood in stool and abdominal distention.       Acid Heartburn Indigestion  Genitourinary: Negative for dysuria, urgency, frequency, hematuria, flank pain, decreased urine volume and difficulty urinating.  Musculoskeletal: Positive for arthralgias. Negative for back pain, gait problem, joint swelling, myalgias, neck pain and neck stiffness.  Skin: Positive for rash (Itching). Negative for  color change and pallor.  Neurological: Positive for headaches. Negative for dizziness, tremors, seizures, syncope, speech difficulty, weakness, light-headedness and numbness.  Hematological: Negative for adenopathy. Does not bruise/bleed easily.  Psychiatric/Behavioral: Negative for confusion, sleep disturbance and agitation. The patient is not nervous/anxious.    Physical Exam:  General - No distress ENT - No sinus tenderness, high arched palate, MP 4, enlarged tongue, no oral exudate, no LAN, no thyromegaly, TM clear, pupils equal/reactive Cardiac - s1s2 regular, no murmur, pulses symmetric Chest - No wheeze/rales/dullness, good air entry, normal respiratory excursion Back - No focal tenderness Abd - Soft, non-tender, no organomegaly, + bowel sounds Ext - No edema Neuro - Normal strength, cranial nerves intact Skin - No rashes Psych - Normal mood, and behavior  Assessment/plan:  Chesley Mires, MD [place] 03/14/2013, 10:48 AM Pager:  [phone] After 3pm call: [phone]

## 2013-03-15 ENCOUNTER — Telehealth: Payer: Self-pay | Admitting: Pulmonary Disease

## 2013-03-15 NOTE — Telephone Encounter (Signed)
Dg Chest 2 View  03/14/2013   EXAM: CHEST  2 VIEW  COMPARISON:  None.  FINDINGS: The heart size and mediastinal contours are within normal limits. Both lungs are clear. The visualized skeletal structures are unremarkable.  IMPRESSION: No active cardiopulmonary disease.   Electronically Signed   By: Kathreen Devoid   On: 03/14/2013 11:27    Will have my nurse inform pt that CXR was normal.

## 2013-03-15 NOTE — Telephone Encounter (Signed)
Pt is aware of CXR results.

## 2013-04-07 IMAGING — CR DG CHEST 2V
2 series · 2 of 2 positions shown · non-contrast
Comparison: 07/08/2011

CLINICAL DATA: Coronary disease, smoker,, prior heart attack

CHEST - 2 VIEW

[w chest pa]
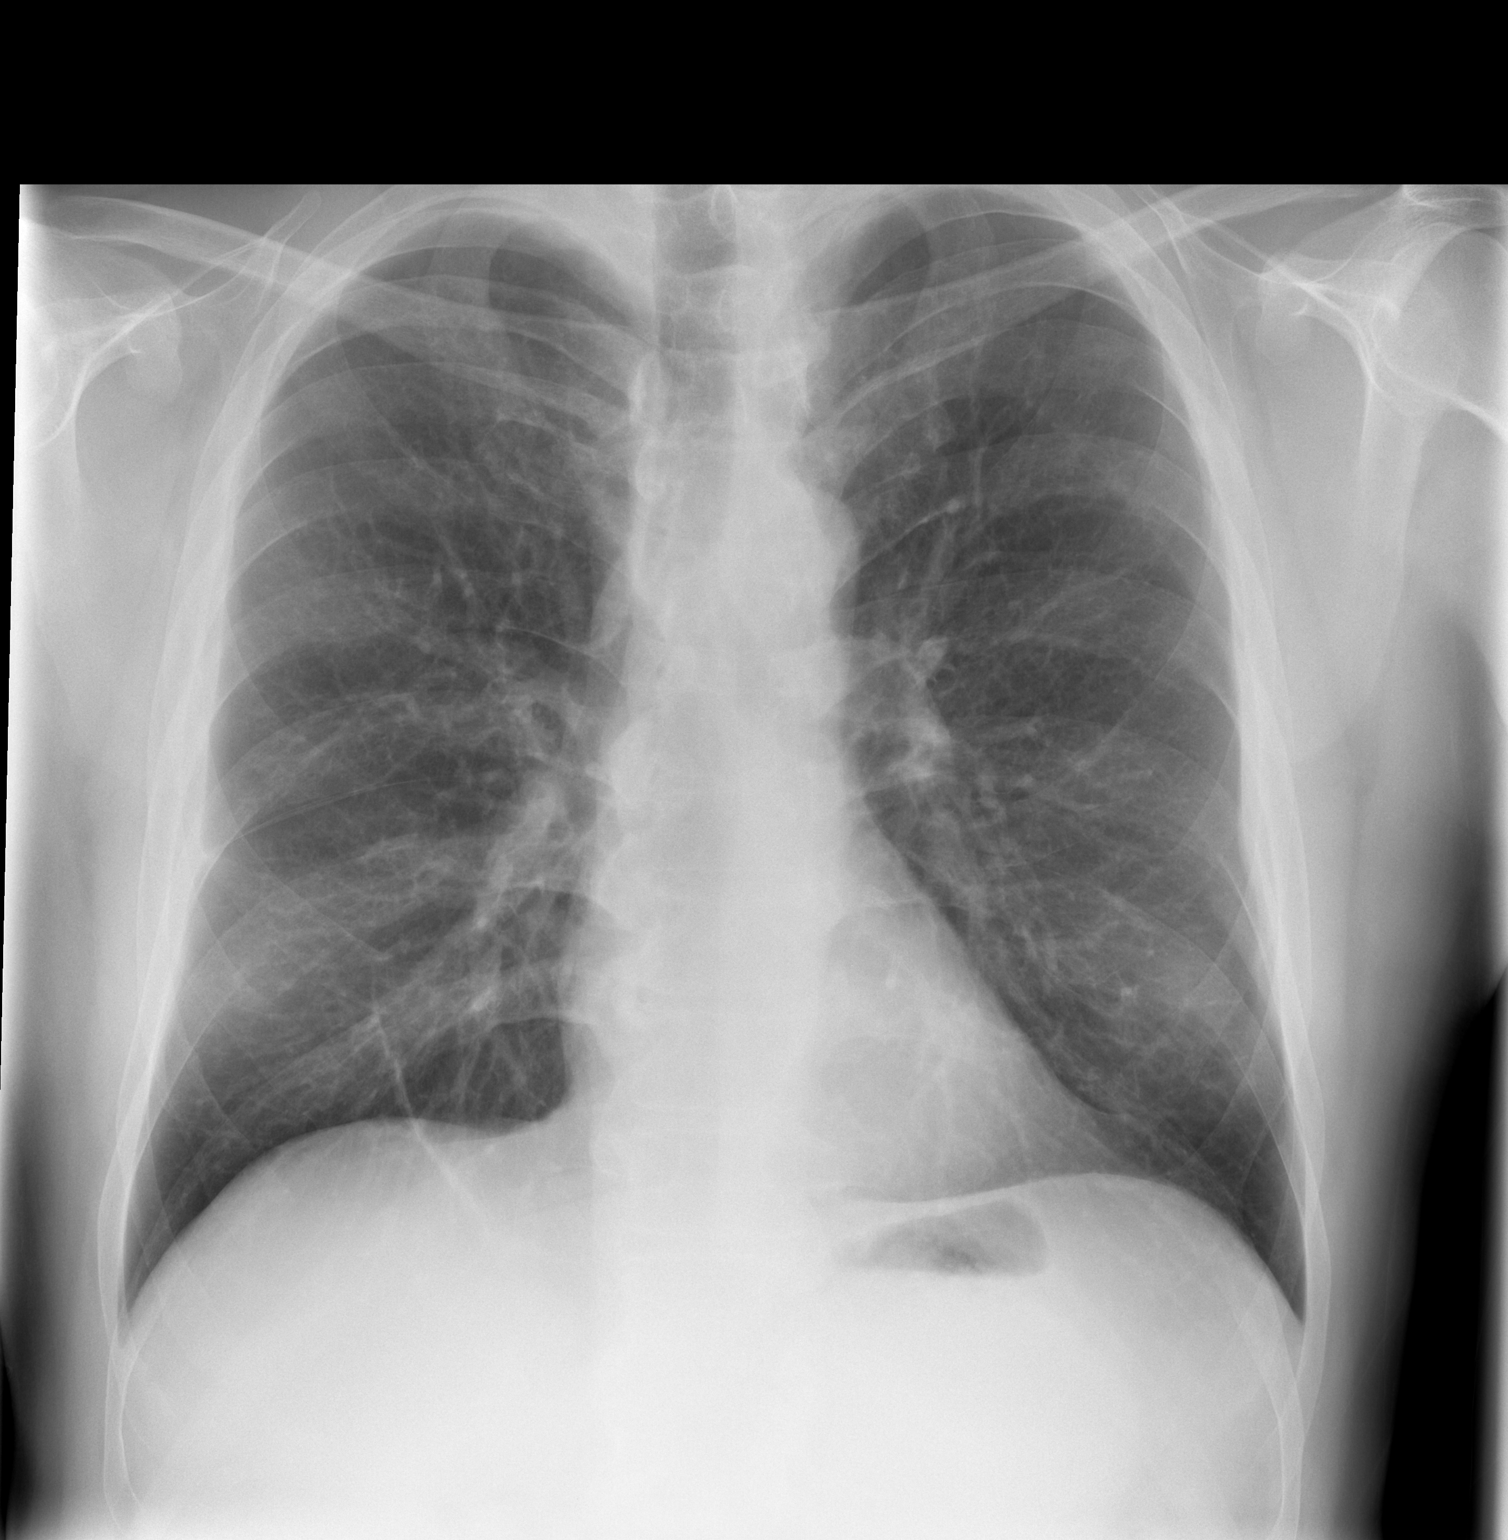

[w chest lat]
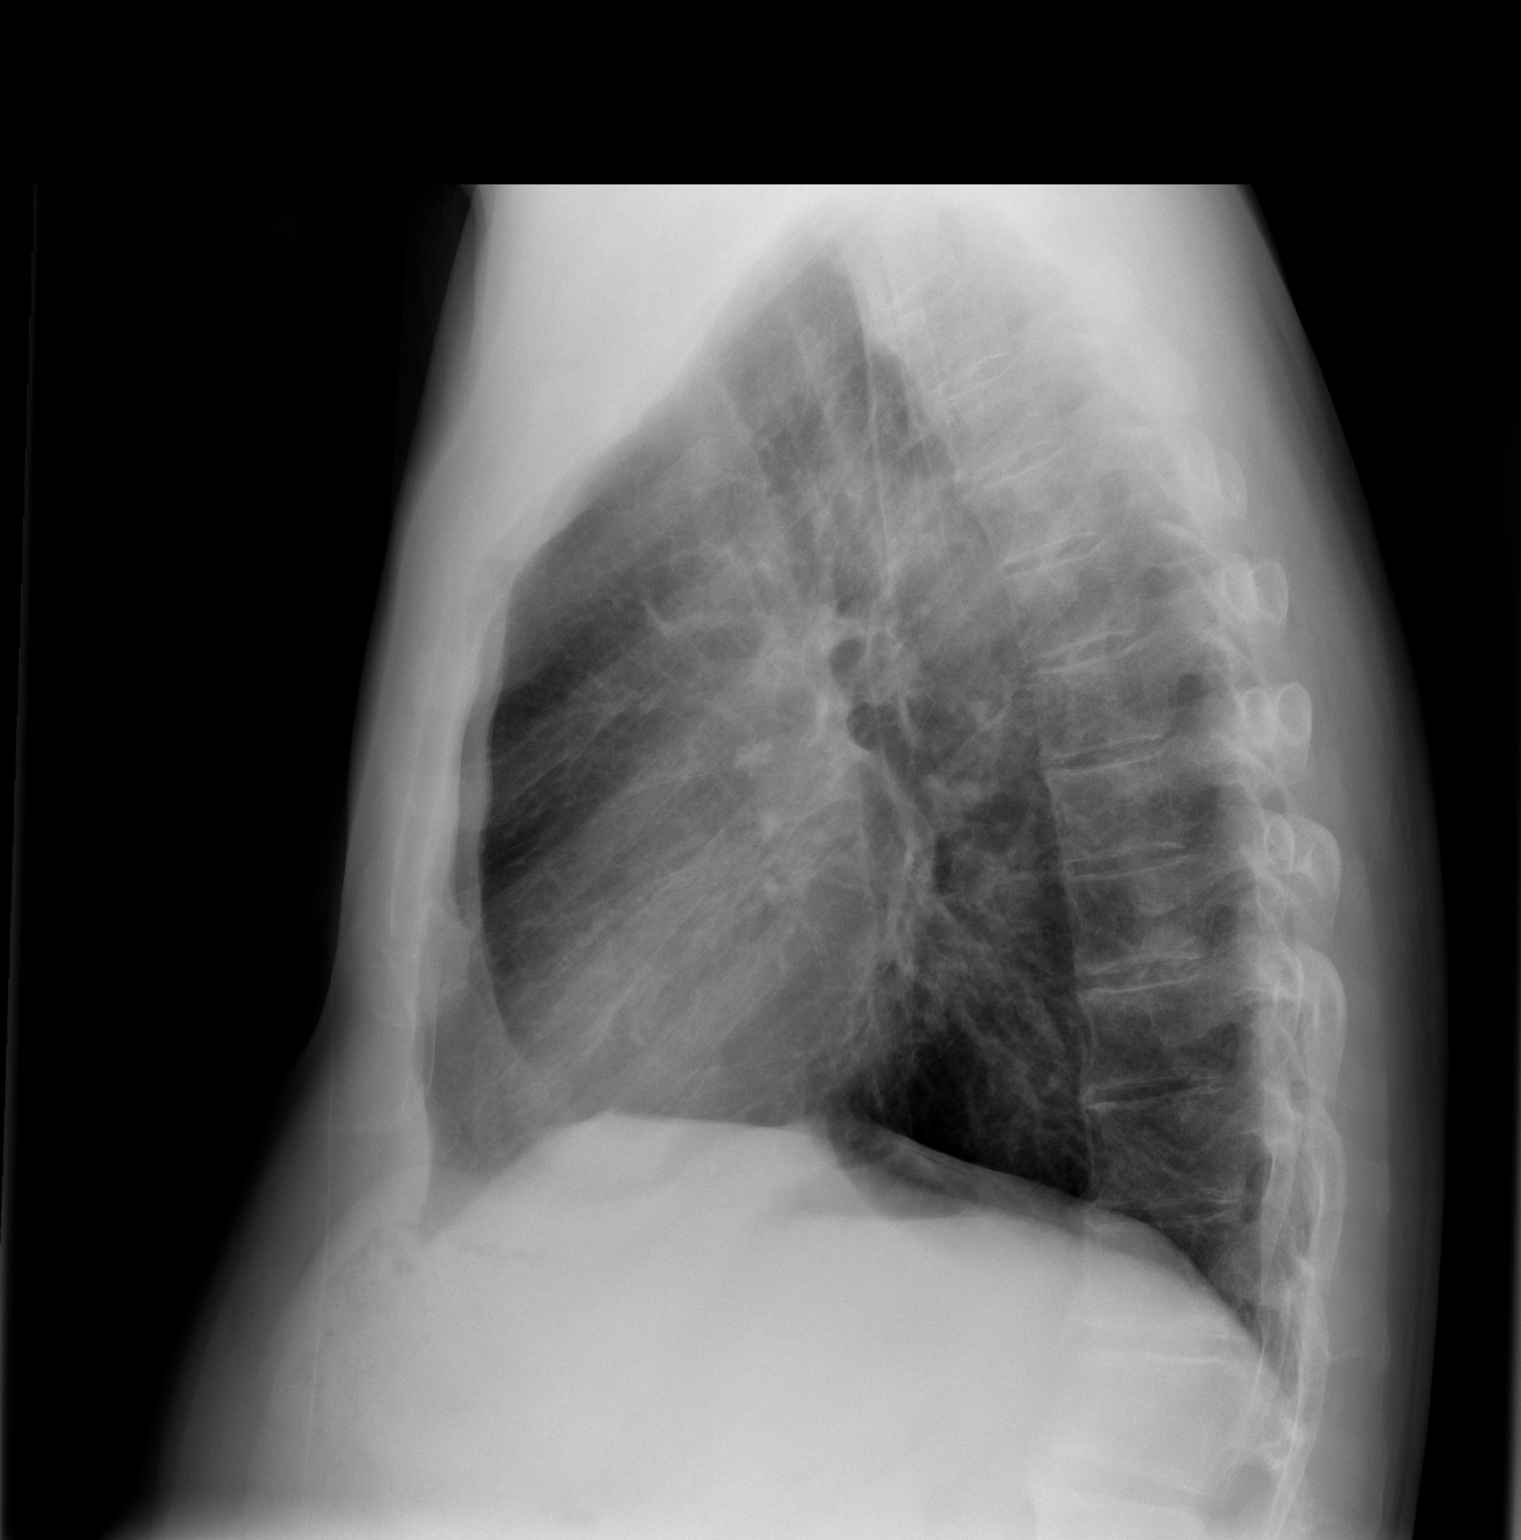

[2 of 2 positions shown; findings below may reference images not displayed]

FINDINGS: Normal heart size and vascularity.  Negative for CHF or
pneumonia.  Minimal subsegmental right lower lobe atelectasis.  No
focal pneumonia, collapse, consolidation, effusion or pneumothorax.
Trachea midline.  Thoracic spondylosis evident.
IMPRESSION: Minimal right base atelectasis.  No acute chest process

## 2013-04-11 ENCOUNTER — Ambulatory Visit (INDEPENDENT_AMBULATORY_CARE_PROVIDER_SITE_OTHER): Payer: BC Managed Care – PPO | Admitting: Pulmonary Disease

## 2013-04-11 DIAGNOSIS — J441 Chronic obstructive pulmonary disease with (acute) exacerbation: Secondary | ICD-10-CM

## 2013-04-11 NOTE — Progress Notes (Signed)
PFT done today. 

## 2013-04-13 ENCOUNTER — Ambulatory Visit (HOSPITAL_BASED_OUTPATIENT_CLINIC_OR_DEPARTMENT_OTHER): Payer: BC Managed Care – PPO | Attending: Pulmonary Disease

## 2013-04-13 VITALS — Ht 71.0 in | Wt 183.0 lb

## 2013-04-13 DIAGNOSIS — Z7982 Long term (current) use of aspirin: Secondary | ICD-10-CM | POA: Insufficient documentation

## 2013-04-13 DIAGNOSIS — I2510 Atherosclerotic heart disease of native coronary artery without angina pectoris: Secondary | ICD-10-CM | POA: Insufficient documentation

## 2013-04-13 DIAGNOSIS — I10 Essential (primary) hypertension: Secondary | ICD-10-CM | POA: Insufficient documentation

## 2013-04-13 DIAGNOSIS — J4489 Other specified chronic obstructive pulmonary disease: Secondary | ICD-10-CM | POA: Insufficient documentation

## 2013-04-13 DIAGNOSIS — G4733 Obstructive sleep apnea (adult) (pediatric): Secondary | ICD-10-CM

## 2013-04-13 DIAGNOSIS — Z79899 Other long term (current) drug therapy: Secondary | ICD-10-CM | POA: Insufficient documentation

## 2013-04-13 DIAGNOSIS — J449 Chronic obstructive pulmonary disease, unspecified: Secondary | ICD-10-CM | POA: Insufficient documentation

## 2013-04-17 DIAGNOSIS — G4733 Obstructive sleep apnea (adult) (pediatric): Secondary | ICD-10-CM

## 2013-04-19 NOTE — Sleep Study (Signed)
[place]  NAME: Troy Parrish DATE OF BIRTH:  [birth year] MEDICAL RECORD NUMBER [id number]  LOCATION: [facility]  PHYSICIAN: Chesley Mires, M.D. DATE OF STUDY: 04/13/2013  SLEEP STUDY TYPE: Nocturnal Polysomnogram               REFERRING PHYSICIAN: Chesley Mires, MD  INDICATION FOR STUDY:  53 year old male with snoring, sleep disruption, apnea, and daytime sleepiness. He has history of hypertension, CAD, and COPD.  He is referred to the sleep lab to further evaluate hypersomnia with obstructive sleep apnea.  EPWORTH SLEEPINESS SCORE: 11. HEIGHT: 5\' 11"  (180.3 cm)  WEIGHT: 183 lb (83.008 kg)    Body mass index is 25.53 kg/(m^2).  NECK SIZE: 16 in.  MEDICATIONS:  Current Outpatient Prescriptions on File Prior to Visit  Medication Sig Dispense Refill  . albuterol (PROVENTIL HFA;VENTOLIN HFA) 108 (90 BASE) MCG/ACT inhaler Inhale 2 puffs into the lungs 2 (two) times daily.  1 Inhaler  3  . aspirin EC 81 MG tablet Take 162 mg by mouth at bedtime.      . furosemide (LASIX) 20 MG tablet Take 20 mg by mouth daily.      Marland Kitchen losartan (COZAAR) 25 MG tablet Take 1 tablet (25 mg total) by mouth daily.  90 tablet  3  . metoprolol succinate (TOPROL-XL) 25 MG 24 hr tablet Take 1/2 tab daily  30 tablet  6  . niacin (NIASPAN) 500 MG CR tablet Take 500 mg by mouth at bedtime.      . prasugrel (EFFIENT) 10 MG TABS tablet Take 1 tablet (10 mg total) by mouth daily.  28 tablet  0  . rosuvastatin (CRESTOR) 20 MG tablet Take 1 tablet (20 mg total) by mouth daily at 6 PM.  30 tablet  6  . triamcinolone (KENALOG) 0.025 % ointment Apply 1 application topically 2 (two) times daily.      Marland Kitchen zolpidem (AMBIEN) 10 MG tablet Take 1 tablet (10 mg total) by mouth at bedtime as needed for sleep.  30 tablet  0   No current facility-administered medications on file prior to visit.    SLEEP ARCHITECTURE:  Total recording time: 381.5 minutes.  Total sleep time was: 190.5 minutes.   Sleep efficiency: 49.9%.  Sleep latency: 43.5 minutes.  REM latency: 78 minutes.  Stage N1: 3.4%.  Stage N2: 87.1%.  Stage N3: 1.3%.  Stage R:  8.1%.  Supine sleep: 14.5 minutes.  Non-supine sleep: 176 minutes.  RESPIRATORY DATA: Average respiratory rate: 16. Snoring: Loud. Average AHI: 59.2.   Apnea index: 13.9.  Hypopnea index: 45.4. Obstructive apnea index: 13.9.  Central apnea index: 0.  Mixed apnea index: 0. REM AHI: 15.5.  NREM AHI: 63.1. Supine AHI: 49.4. Non-supine AHI: 27.6.  OXYGEN DATA:  Baseline oxygenation: 92%. Lowest SaO2: 86%. Time spent below SaO2 90%: 50.4 minutes. Supplemental oxygen used: None.  CARDIAC DATA:  Average heart rate: 68 beats per minute. Rhythm strip: sinus rhythm with sinus arrhythmia and occasional PVC's.  MOVEMENT/PARASOMNIA:  Periodic limb movement: 0.  Period limb movements with arousals: 0. Restroom trips: One.  IMPRESSION/ RECOMMENDATION:   This study shows severe obstructive sleep apnea with an AHI of 59.2 and SaO2 low of 86%.   Additional therapies include weight loss, CPAP, oral appliance, or surgical evaluation.   Chesley Mires, M.D. Diplomate, Tax adviser of Sleep Medicine  ELECTRONICALLY SIGNED ON:  04/19/2013, 12:48 PM [place] PH: [phone]   FX: [phone]  [phone] ACCREDITED BY THE [employer]ICAN [employer]EMY OF SLEEP MEDICINE  

## 2013-04-20 ENCOUNTER — Telehealth: Payer: Self-pay | Admitting: Pulmonary Disease

## 2013-04-20 LAB — PULMONARY FUNCTION TEST
DL/VA % pred: 77 %
DL/VA: 3.59 ml/min/mmHg/L
DLCO unc % pred: 70 %
DLCO unc: 22.82 ml/min/mmHg
FEF 25-75 Post: 3.15 L/sec
FEF 25-75 Pre: 1.8 L/sec
FEF2575-%Change-Post: 74 %
FEF2575-%Pred-Post: 94 %
FEF2575-%Pred-Pre: 54 %
FEV1-%Change-Post: 17 %
FEV1-%Pred-Post: 81 %
FEV1-%Pred-Pre: 69 %
FEV1-Post: 3.11 L
FEV1-Pre: 2.65 L
FEV1FVC-%Change-Post: 1 %
FEV1FVC-%Pred-Pre: 93 %
FEV6-%Change-Post: 14 %
FEV6-%Pred-Post: 87 %
FEV6-%Pred-Pre: 75 %
FEV6-Post: 4.17 L
FEV6-Pre: 3.63 L
FEV6FVC-%Change-Post: 0 %
FEV6FVC-%Pred-Post: 103 %
FEV6FVC-%Pred-Pre: 102 %
FVC-%Change-Post: 15 %
FVC-%Pred-Post: 85 %
FVC-%Pred-Pre: 73 %
FVC-Post: 4.24 L
FVC-Pre: 3.68 L
Post FEV1/FVC ratio: 74 %
Post FEV6/FVC ratio: 99 %
Pre FEV1/FVC ratio: 72 %
Pre FEV6/FVC Ratio: 99 %
RV % pred: 115 %
RV: 2.43 L
TLC % pred: 94 %
TLC: 6.58 L

## 2013-04-20 NOTE — Telephone Encounter (Signed)
PFT 04/11/13 >> FEV1 2.65 (69%), FEV1% 72, TLC 6.58 (94%), DLCO 70%, +BD  PSG 04/13/13 >> AHI 59.2, SpO2 low 86%.  Will have my nurse schedule next available ROV to review results.  Okay to double book visit.

## 2013-04-20 NOTE — Telephone Encounter (Signed)
Pt has been scheduled for 05/04/13 at 11:15am.

## 2013-05-04 ENCOUNTER — Encounter: Payer: Self-pay | Admitting: Pulmonary Disease

## 2013-05-04 ENCOUNTER — Ambulatory Visit (INDEPENDENT_AMBULATORY_CARE_PROVIDER_SITE_OTHER): Payer: BC Managed Care – PPO | Admitting: Pulmonary Disease

## 2013-05-04 VITALS — BP 148/88 | HR 108 | Temp 97.1°F | Ht 71.0 in | Wt 194.4 lb

## 2013-05-04 DIAGNOSIS — G4733 Obstructive sleep apnea (adult) (pediatric): Secondary | ICD-10-CM

## 2013-05-04 DIAGNOSIS — J449 Chronic obstructive pulmonary disease, unspecified: Secondary | ICD-10-CM

## 2013-05-04 DIAGNOSIS — F17200 Nicotine dependence, unspecified, uncomplicated: Secondary | ICD-10-CM

## 2013-05-04 DIAGNOSIS — Z720 Tobacco use: Secondary | ICD-10-CM

## 2013-05-04 MED ORDER — BUPROPION HCL ER (SR) 150 MG PO TB12: 150.0000 mg | ORAL_TABLET | Freq: Two times a day (BID) | ORAL | Status: DC

## 2013-05-04 NOTE — Assessment & Plan Note (Signed)
He has severe sleep apnea.  I have reviewed the recent sleep study results with the patient.  We discussed how sleep apnea can affect various health problems including risks for hypertension, cardiovascular disease, and diabetes.  We also discussed how sleep disruption can increase risks for accident, such as while driving.  Weight loss as a means of improving sleep apnea was also reviewed.  Additional treatment options discussed were CPAP therapy, oral appliance, and surgical intervention.  Will arrange for auto CPAP set up. 

## 2013-05-04 NOTE — Assessment & Plan Note (Signed)
He has borderline obstruction.  He can continue prn albuterol for now.

## 2013-05-04 NOTE — Progress Notes (Signed)
Chief Complaint  Patient presents with  . Follow-up    With sleep study and pft results. Progressively sleeping worse, hard time falling asleep ang awaking often.     History of Present Illness: Troy Parrish is a 53 y.o. male smoker with OSA, and borderline COPD.  He is here to review his sleep study and PFT.    His PFT showed borderline COPD.  He is down to smoking 10 cigarettes per day.  He tried chantix before, and did well with this >> he is concerned about expense of medicine.  He uses his albuterol occasional when he gets short of breath and this helps.  His sleep study shows severe sleep apnea.  He is continuing to have trouble with his sleep and feeling sleepy during the day.   TESTS: Echo 07/10/11 >> EF 50 to 93%, grade 1 diastolic dysfx  Spirometry 11/09/11 >> FEV1 3.26 (67%), FEV1% 67 PFT 04/11/13 >> FEV1 2.65 (69%), FEV1% 72, TLC 6.58 (94%), DLCO 70%, +BD  PSG 04/13/13 >> AHI 59.2, SpO2 low 86%.   Troy Parrish  has a past medical history of HTN (hypertension) (07/07/2011); History of colon polyps; Hyperlipidemia (07/07/2011); Recurrent upper respiratory infection (URI); CAD S/P percutaneous coronary angioplasty (07/07/2011); ST elevation myocardial infarction (STEMI) of anterior wall (2001); ST elevation myocardial infarction (STEMI) of inferior wall (07/07/2011); and Unstable angina ( 2006).  Troy Parrish  has past surgical history that includes transthoracic echocardiogram (07/10/2011); nuclear exercise study (11/30/2011); MET/CPET (11/09/2011); Cardiac catheterization (07/07/2011); Coronary angioplasty with stent (06/2011); Coronary angioplasty with stent (2001); and Coronary angioplasty with stent (2006).  Prior to Admission medications   Medication Sig Start Date End Date Taking? Authorizing Provider  albuterol (PROVENTIL HFA;VENTOLIN HFA) 108 (90 BASE) MCG/ACT inhaler Inhale 2 puffs into the lungs 2 (two) times daily. 03/14/13  Yes Chesley Mires, MD  aspirin EC 81 MG tablet Take 162  mg by mouth at bedtime.   Yes Historical Provider, MD  furosemide (LASIX) 20 MG tablet Take 20 mg by mouth daily.   Yes Historical Provider, MD  losartan (COZAAR) 25 MG tablet Take 1 tablet (25 mg total) by mouth daily. 12/15/12  Yes Leonie Man, MD  metoprolol succinate (TOPROL-XL) 25 MG 24 hr tablet Take 1/2 tab daily 12/15/12  Yes Leonie Man, MD  niacin (NIASPAN) 500 MG CR tablet Take 500 mg by mouth at bedtime.   Yes Historical Provider, MD  prasugrel (EFFIENT) 10 MG TABS tablet Take 1 tablet (10 mg total) by mouth daily. 12/15/12  Yes Leonie Man, MD  rosuvastatin (CRESTOR) 20 MG tablet Take 1 tablet (20 mg total) by mouth daily at 6 PM. 08/15/12 08/15/13 Yes Leonie Man, MD  triamcinolone (KENALOG) 0.025 % ointment Apply 1 application topically 2 (two) times daily.    Historical Provider, MD  zolpidem (AMBIEN) 10 MG tablet Take 1 tablet (10 mg total) by mouth at bedtime as needed for sleep. 07/11/11 03/14/14  Tarri Fuller, PA-C    No Known Allergies   Physical Exam:  General - No distress ENT - No sinus tenderness, no oral exudate, no LAN, high arched palate, MP 4, enlarged tongue Cardiac - s1s2 regular, no murmur Chest - No wheeze/rales/dullness Back - No focal tenderness Abd - Soft, non-tender Ext - No edema Neuro - Normal strength Skin - No rashes Psych - normal mood, and behavior   Assessment/Plan:  Chesley Mires, MD [place] [facility]/Sleep Pager:  [phone]

## 2013-05-04 NOTE — Assessment & Plan Note (Signed)
Encouraged him to continue his smoking cessation efforts.  Will try him on buproprion >> discussed dosing regimen, setting quit date, and side effects to monitor for.  Will also refer to smoking cessation classes >> he will then decide if this works with his schedule.

## 2013-05-04 NOTE — Patient Instructions (Signed)
Bupropion 150 mg pill >> one pill daily for 3 days, then one pill twice per day for 4 days >> then quit smoking and continue one pill twice per day Will arrange for CPAP set up at home Will arrange for referral to smoking cessation classes Follow up in 2 months

## 2013-05-25 ENCOUNTER — Other Ambulatory Visit: Payer: Self-pay | Admitting: Cardiology

## 2013-05-27 NOTE — Telephone Encounter (Signed)
Can we clarify if this can be e-scribed.  Leonie Man, MD

## 2013-05-28 ENCOUNTER — Other Ambulatory Visit: Payer: Self-pay | Admitting: *Deleted

## 2013-05-28 MED ORDER — PRASUGREL HCL 10 MG PO TABS: 10.0000 mg | ORAL_TABLET | Freq: Every day | ORAL | Status: DC

## 2013-05-28 MED ORDER — FUROSEMIDE 20 MG PO TABS: 20.0000 mg | ORAL_TABLET | Freq: Every day | ORAL | Status: DC

## 2013-05-28 NOTE — Telephone Encounter (Signed)
effient and furosemide refilled #30 w/5 refills to [facility], [place].

## 2013-05-28 NOTE — Telephone Encounter (Signed)
Pt said he did not receive his Effient and Ambien prescription.

## 2013-05-28 NOTE — Telephone Encounter (Signed)
Called to patient. He states he has severe OSA and does not wish to take [place] - stated pharmacy had it on list that he was due for refills. Will refuse at this time.

## 2013-05-28 NOTE — Telephone Encounter (Signed)
Cannot be e-scribed, but I can complete the Rx if you want to refill and print it for you to sign when you are back in office   Thanks  Eliezer Lofts

## 2013-05-28 NOTE — Telephone Encounter (Signed)
Effient refilled - waiting on ambien approval

## 2013-05-29 ENCOUNTER — Encounter (INDEPENDENT_AMBULATORY_CARE_PROVIDER_SITE_OTHER): Payer: BC Managed Care – PPO

## 2013-05-29 DIAGNOSIS — F17200 Nicotine dependence, unspecified, uncomplicated: Secondary | ICD-10-CM

## 2013-06-18 ENCOUNTER — Encounter: Payer: Self-pay | Admitting: Pulmonary Disease

## 2013-06-19 ENCOUNTER — Ambulatory Visit: Payer: BC Managed Care – PPO

## 2013-06-26 ENCOUNTER — Ambulatory Visit: Payer: Self-pay

## 2013-06-28 ENCOUNTER — Ambulatory Visit (INDEPENDENT_AMBULATORY_CARE_PROVIDER_SITE_OTHER): Payer: BC Managed Care – PPO | Admitting: Pulmonary Disease

## 2013-06-28 ENCOUNTER — Encounter: Payer: Self-pay | Admitting: Pulmonary Disease

## 2013-06-28 VITALS — BP 128/82 | HR 96 | Temp 99.0°F | Ht 72.0 in | Wt 199.0 lb

## 2013-06-28 DIAGNOSIS — J4489 Other specified chronic obstructive pulmonary disease: Secondary | ICD-10-CM

## 2013-06-28 DIAGNOSIS — J449 Chronic obstructive pulmonary disease, unspecified: Secondary | ICD-10-CM

## 2013-06-28 DIAGNOSIS — G4733 Obstructive sleep apnea (adult) (pediatric): Secondary | ICD-10-CM

## 2013-06-28 MED ORDER — FLUTICASONE FUROATE-VILANTEROL 100-25 MCG/INH IN AEPB: 1.0000 | INHALATION_SPRAY | Freq: Every day | RESPIRATORY_TRACT | Status: DC

## 2013-06-28 NOTE — Progress Notes (Signed)
Chief Complaint  Patient presents with  . Follow-up    Pt reports good tolerance of CPAP. Wears nightly x 2-7hrs. Pt states that pressure seems to high at times- he has been removing mask during night.     History of Present Illness: Troy Parrish is a 53 y.o. male former smoker with OSA, and borderline COPD.  He stopped smoking a few weeks ago.    He is still getting short of breath with exertion.  He uses albuterol some, and this helps.    He has been using CPAP.  This helps his sleep.  He does not feel as tired.  His mask will sometimes come off around 3 am >> he wakes up, and then does not feel like going back to sleep because he feels rested.  TESTS: Echo 07/10/11 >> EF 50 to 09%, grade 1 diastolic dysfx  Spirometry 11/09/11 >> FEV1 3.26 (67%), FEV1% 67 PFT 04/11/13 >> FEV1 2.65 (69%), FEV1% 72, TLC 6.58 (94%), DLCO 70%, +BD  PSG 04/13/13 >> AHI 59.2, SpO2 low 86%. Auto CPAP 05/26/13 to 06/08/13 >> used on 14 of 14 nights with average 4 hrs 51 min.  Average AHI 1.5 with median CPAP 8 cm H2O and 95 th percentile CPAP 11 cm H2O.  Troy Parrish  has a past medical history of HTN (hypertension) (07/07/2011); History of colon polyps; Hyperlipidemia (07/07/2011); Recurrent upper respiratory infection (URI); CAD S/P percutaneous coronary angioplasty (07/07/2011); ST elevation myocardial infarction (STEMI) of anterior wall (2001); ST elevation myocardial infarction (STEMI) of inferior wall (07/07/2011); and Unstable angina ( 2006).  Troy Parrish  has past surgical history that includes transthoracic echocardiogram (07/10/2011); nuclear exercise study (11/30/2011); MET/CPET (11/09/2011); Cardiac catheterization (07/07/2011); Coronary angioplasty with stent (06/2011); Coronary angioplasty with stent (2001); and Coronary angioplasty with stent (2006).  Prior to Admission medications   Medication Sig Start Date End Date Taking? Authorizing Provider  albuterol (PROVENTIL HFA;VENTOLIN HFA) 108 (90 BASE) MCG/ACT  inhaler Inhale 2 puffs into the lungs 2 (two) times daily. 03/14/13  Yes Chesley Mires, MD  aspirin EC 81 MG tablet Take 162 mg by mouth at bedtime.   Yes Historical Provider, MD  furosemide (LASIX) 20 MG tablet Take 20 mg by mouth daily.   Yes Historical Provider, MD  losartan (COZAAR) 25 MG tablet Take 1 tablet (25 mg total) by mouth daily. 12/15/12  Yes Leonie Man, MD  metoprolol succinate (TOPROL-XL) 25 MG 24 hr tablet Take 1/2 tab daily 12/15/12  Yes Leonie Man, MD  niacin (NIASPAN) 500 MG CR tablet Take 500 mg by mouth at bedtime.   Yes Historical Provider, MD  prasugrel (EFFIENT) 10 MG TABS tablet Take 1 tablet (10 mg total) by mouth daily. 12/15/12  Yes Leonie Man, MD  rosuvastatin (CRESTOR) 20 MG tablet Take 1 tablet (20 mg total) by mouth daily at 6 PM. 08/15/12 08/15/13 Yes Leonie Man, MD  triamcinolone (KENALOG) 0.025 % ointment Apply 1 application topically 2 (two) times daily.    Historical Provider, MD  zolpidem (AMBIEN) 10 MG tablet Take 1 tablet (10 mg total) by mouth at bedtime as needed for sleep. 07/11/11 03/14/14  Tarri Fuller, PA-C    No Known Allergies   Physical Exam:  General - No distress ENT - No sinus tenderness, no oral exudate, no LAN, high arched palate, MP 4, enlarged tongue Cardiac - s1s2 regular, no murmur Chest - No wheeze/rales/dullness Back - No focal tenderness Abd - Soft, non-tender Ext - No edema Neuro -  Normal strength Skin - No rashes Psych - normal mood, and behavior   Assessment/Plan:  Chesley Mires, MD [place] [facility]Critical [facility]/Sleep Pager:  [phone]

## 2013-06-28 NOTE — Assessment & Plan Note (Addendum)
He has borderline COPD.  He did have bronchodilator responsiveness.  He reports benefit from albuterol.  He has persistent dyspnea.  Will give him trial of breo.  Also discussed how dyspnea can be sign of cardiac disease.  If his symptoms persist, then he would need to have f/u with cardiology.

## 2013-06-28 NOTE — Patient Instructions (Signed)
Breo one puff daily >> rinse mouth after each use Albuterol two puffs up to four times per day as needed for cough, wheeze, or chest congestion Follow up in 4 months

## 2013-07-04 ENCOUNTER — Encounter: Payer: Self-pay | Admitting: Pulmonary Disease

## 2013-07-04 NOTE — Assessment & Plan Note (Signed)
He is compliant with CPAP and reports benefit.  Will have him continue with auto CPAP.

## 2013-07-09 DIAGNOSIS — R7303 Prediabetes: Secondary | ICD-10-CM

## 2013-07-09 HISTORY — PX: RIGHT HEART CATH: SHX6075

## 2013-07-09 HISTORY — DX: Prediabetes: R73.03

## 2013-07-23 ENCOUNTER — Ambulatory Visit (INDEPENDENT_AMBULATORY_CARE_PROVIDER_SITE_OTHER): Payer: BC Managed Care – PPO | Admitting: Cardiology

## 2013-07-23 VITALS — BP 128/83 | HR 91 | Ht 72.0 in | Wt 195.7 lb

## 2013-07-23 DIAGNOSIS — I255 Ischemic cardiomyopathy: Secondary | ICD-10-CM

## 2013-07-23 DIAGNOSIS — R0989 Other specified symptoms and signs involving the circulatory and respiratory systems: Secondary | ICD-10-CM

## 2013-07-23 DIAGNOSIS — E782 Mixed hyperlipidemia: Secondary | ICD-10-CM

## 2013-07-23 DIAGNOSIS — R0600 Dyspnea, unspecified: Secondary | ICD-10-CM

## 2013-07-23 DIAGNOSIS — Z01818 Encounter for other preprocedural examination: Secondary | ICD-10-CM

## 2013-07-23 DIAGNOSIS — D689 Coagulation defect, unspecified: Secondary | ICD-10-CM

## 2013-07-23 DIAGNOSIS — I2109 ST elevation (STEMI) myocardial infarction involving other coronary artery of anterior wall: Secondary | ICD-10-CM

## 2013-07-23 DIAGNOSIS — Z9861 Coronary angioplasty status: Secondary | ICD-10-CM

## 2013-07-23 DIAGNOSIS — R079 Chest pain, unspecified: Secondary | ICD-10-CM

## 2013-07-23 DIAGNOSIS — G4733 Obstructive sleep apnea (adult) (pediatric): Secondary | ICD-10-CM

## 2013-07-23 DIAGNOSIS — Z79899 Other long term (current) drug therapy: Secondary | ICD-10-CM

## 2013-07-23 DIAGNOSIS — I2510 Atherosclerotic heart disease of native coronary artery without angina pectoris: Secondary | ICD-10-CM

## 2013-07-23 DIAGNOSIS — I5189 Other ill-defined heart diseases: Secondary | ICD-10-CM

## 2013-07-23 DIAGNOSIS — I2589 Other forms of chronic ischemic heart disease: Secondary | ICD-10-CM

## 2013-07-23 DIAGNOSIS — R0609 Other forms of dyspnea: Secondary | ICD-10-CM

## 2013-07-23 NOTE — Patient Instructions (Signed)
Please do lab when you had nothing to eat or drink the morning of  Your physician has requested that you have a cardiac catheterization. Cardiac catheterization is used to diagnose and/or treat various heart conditions. Doctors may recommend this procedure for a number of different reasons. The most common reason is to evaluate chest pain. Chest pain can be a symptom of coronary artery disease (CAD), and cardiac catheterization can show whether plaque is narrowing or blocking your heart's arteries. This procedure is also used to evaluate the valves, as well as measure the blood flow and oxygen levels in different parts of your heart. For further information please visit [email or web address]. Please follow instruction sheet, as given.  LEFT  RADIAL AND BRACHIAL ACCESS  Your physician recommends that you schedule a follow-up appointment AFTER CARDIAC ACATH.

## 2013-07-24 ENCOUNTER — Encounter: Payer: Self-pay | Admitting: Cardiology

## 2013-07-24 DIAGNOSIS — R0600 Dyspnea, unspecified: Secondary | ICD-10-CM | POA: Insufficient documentation

## 2013-07-24 DIAGNOSIS — R0609 Other forms of dyspnea: Secondary | ICD-10-CM

## 2013-07-24 NOTE — Assessment & Plan Note (Signed)
It is somewhat concerning in his exertional dyspnea is much worse that seems a bit over the last couple years. He never did get better after his MRI despite having a relatively benign Myoview and echo. At this point I don't think doing another stress test or echocardiogram can help from a diagnostic standpoint. I think the only way to evaluate this is with invasive study.  Plan: Right and left heart catheterization. Rule out pulmonary hypertension and coronary disease as well as diastolic heart failure. Continue treatment COPD. Will likely need to increase her furosemide dosing based on RHC findings

## 2013-07-24 NOTE — Progress Notes (Signed)
PATIENT: [name] MRN: [id number]  DOB: [birth year]   DOV:07/24/2013 PCP: Letta Median, MD  Clinic Note: Chief Complaint  Patient presents with  . Follow-up    3 months follow-up, pain started at shoulder from the left side to ride side then into the back.    HPI: Troy Parrish is a 53 y.o. male with a PMH below who presents today for six-month followup. I last saw him in the beginning of November. He has had persistent dyspnea since his most recent MI a couple years ago.  He has been evaluated with a CPET test on which he did quite poorly with a peak VO2 of 53% initially. A followup study this spring showed significant improvement with better overall effort and a peak VO2 increased to 63%. He was only able to get his heart rate up to 81% of predicted showing some chronotropic incompetence. The other is seen, and it was that his pulmonary function tests were quite low, with FVC, and FEV1 consistent with COPD - he has seen Dr. Halford Chessman, who has told him that his dyspnea is out of proportion with his lung disease. He is started on CPAP for sleep apnea. He is referred back to cardiology.  CARDIAC HISTORY:   Anterior MI with a PCI to the LAD in 2001.   Unstable Angina 2006: LCx-OM1 stenosis --> PCI with 2.5-x-13-mm Cypher DES by Dr. Dorris Fetch in [place], [place].   His former cardiologist was Dr. Remi Haggard.     He subsequently moved to [place], [place].   Inferolateral- Posterior STEMI in May 2013: occluded RCA treated --> Integrity Resolute DES 2.75-x-18-mm stent (postdilated to 3.1 mm)  EF by Echo: 50% to 55% with grade 1 diastolic dysfunction and mild hypokinesis of the inferolateral myocardium.  Non-Invasive Evaluations:  CPET-MET tctober 1, 2013:  submaximal effort reaching only 1.05 RER as opposed to 1.09 and with this, his peak VO2 was 51% -> borderline severe. He was noted to have chronotropic incompetence with his heart rate only going from the low 80s up to roughly 100.  For that, his beta blocker was decreased.    TM Myoview Nov 30, 2011: 9 min; 10 METS; HR up to 169 bpm.  Medium-size moderate-intensity perfusion defect in the basal inferolateral, mid inferolateral walls with minimal reversibility suggestive of an old infarct with mild peri-infarct ischemia. Read as a low-risk scan.   Inferolateral Infarction would go along with known RCA & Cx territory disease/MIs.  CPET-MET 2014: Peak VO2 63%, better HR response.  Interval History: He continues to have his exertional dyspnea stating that simply caring about her groceries up a flight of steps makes him have to stop to catch his breath for 5 minutes. Doing routine chores around the house makes him short of breath. He is also now started noting some discomfort across the upper portion of his chest that is sometimes in other times not associated with exertion. He says his exertional dyspnea is getting worse and considerably limiting his ability to do anything. He is very concerned because of the latter he made some errors in judgment in his finances and therefore was unable to obtain medications for several months. He is now back on his medications and is very concerned that he could have some stent thrombosis. He does have orthopnea and no real PND. No significant edema. Otherwise he denies any significant palpitations or rapid heart beats. No significant orthostatic symptoms. No lightheadedness, dizziness weakness or syncope/near-syncope. No TIA or amaurosis fugax symptoms.  No melena, hematochezia or hematuria. No claudication symptoms.  Past Medical History  Diagnosis Date  . HTN (hypertension) 07/07/2011  . History of colon polyps   . Hyperlipidemia 07/07/2011  . Recurrent upper respiratory infection (URI)   . CAD S/P percutaneous coronary angioplasty 07/07/2011    Has had PCI to all 3 major vessels  . ST elevation myocardial infarction (STEMI) of anterior wall 2001    History of; prior cardiologist was Dr. Remi Haggard in [place]  . ST elevation myocardial infarction (STEMI) of inferior wall 07/07/2011    Occluded RCA - Integrity Resolute DES 2.75 mm 18 mm (3.1 mm)  . Unstable angina  2006    Cx-OM - PCI 2.5 mm 13 mm Cypher DES [name], [place])     Prior Cardiac Evaluation and Past Surgical History: Reviewed No Known Allergies Current Outpatient Prescriptions on File Prior to Visit  Medication Sig Dispense Refill  . albuterol (PROVENTIL HFA;VENTOLIN HFA) 108 (90 BASE) MCG/ACT inhaler Inhale 2 puffs into the lungs 2 (two) times daily.  1 Inhaler  3  . aspirin EC 81 MG tablet Take 162 mg by mouth at bedtime.      Marland Kitchen buPROPion (WELLBUTRIN SR) 150 MG 12 hr tablet Take 150 mg by mouth daily.      . Fluticasone Furoate-Vilanterol (BREO ELLIPTA) 100-25 MCG/INH AEPB Inhale 1 puff into the lungs daily.  28 each  5  . furosemide (LASIX) 20 MG tablet Take 1 tablet (20 mg total) by mouth daily.  30 tablet  5  . losartan (COZAAR) 25 MG tablet Take 1 tablet (25 mg total) by mouth daily.  90 tablet  3  . metoprolol succinate (TOPROL-XL) 25 MG 24 hr tablet Take 1/2 tab daily  30 tablet  6  . NIASPAN 500 MG CR tablet TAKE 1 TABLET AT BEDTIME 30 MINUTES AFTER ASPIRIN  30 tablet  6  . prasugrel (EFFIENT) 10 MG TABS tablet Take 1 tablet (10 mg total) by mouth daily.  30 tablet  5  . rosuvastatin (CRESTOR) 20 MG tablet Take 1 tablet (20 mg total) by mouth daily at 6 PM.  30 tablet  6  . triamcinolone (KENALOG) 0.025 % ointment Apply 1 application topically 2 (two) times daily.       No current facility-administered medications on file prior to visit.   Social History reviewed. He is still not working. He did recently quit smoking cigarettes a month ago.    ROS: A comprehensive Review of Systems - Negative except Exertional dyspnea noted above, but no significant daytime coughing. General ROS: positive for  - fatigue, malaise and sleep disturbance negative for - chills, fever, hot flashes, night sweats, weight  gain or weight loss Psychological ROS: positive for - decreased libido, depression, mood swings, sleep disturbances and Decrease sense of self worth, lack of appetite, anhedonia negative for -  concentration difficulties or suicidal ideation  PHYSICAL EXAM BP 128/83  Pulse 91  Ht 6' (1.829 m)  Wt 195 lb 11.2 oz (88.769 kg)  BMI 26.54 kg/m2 General appearance: alert, cooperative, appears stated age, no distress and He actually has more color than usual today and in somewhat better spirits. Well-nourished and well-groomed Neck: no adenopathy, no carotid bruit, no JVD and supple, symmetrical, trachea midline Lungs: diminished breath sounds bilaterally, wheezes bilaterally and Otherwise the most part CPAP. Nonlabored. Increased AP diameter. Prolonged expiratory phase Heart: RRR with ectopy, S1, S2 normal, no murmur, click, rub or gallop and normal apical impulse Abdomen: soft,  non-tender; bowel sounds normal; no masses,  no organomegaly and Mildly protuberant Extremities: extremities normal, atraumatic, no cyanosis or edema Pulses: 2+ and symmetric Neurologic: Mental status: Alert, oriented, thought content appropriate, affect: blunted and mood-congruent Cranial nerves: normal  [id number] today: Yes Rate: 91, Rhythm: NSR; low-voltage in limb leads;  nonspecific ST and T wave changes; otherwise normal/stable  ECG;  Recent Labs: None recorded  ASSESSMENT / PLAN: DOE (dyspnea on exertion) - Class III It is somewhat concerning in his exertional dyspnea is much worse that seems a bit over the last couple years. He never did get better after his MRI despite having a relatively benign Myoview and echo. At this point I don't think doing another stress test or echocardiogram can help from a diagnostic standpoint. I think the only way to evaluate this is with invasive study.  Plan: Right and left heart catheterization. Rule out pulmonary hypertension and coronary disease as well as diastolic heart  failure. Continue treatment COPD. Will likely need to increase her furosemide dosing based on RHC findings  Chest pain His current chest pain does not sound anginal, but in the setting of exertional dyspnea is somewhat concerned this afternoon with worsening exertional dyspnea. He does not remember having angina per se his previous MIs.  CAD S/P percutaneous coronary angioplasty Last evaluation was nonischemic Myoview. He remains on aspirin plus Effient but says he missed a few months. Continue also with statins plus beta blocker and ARB.  At this point, with exertional dyspnea out of proportion to his COPD, we need to exclude CAD and pulmonary hypertension.  Plan: Right heart cath was PCI.  Cardiomyopathy, ischemic; EF 50-55% with hypokinesis of the inferior and inferolateral myocardium His most recent evaluation showed an EF that was relatively preserved, and she has some diastolic dysfunction but again proportion to the extent of his exertional dyspnea.  Chronotropic incompetence with left ventricular dysfunction --> improved with reduction of beta blocker Better response from currently on his last evaluation. We have low-dose beta blocker to avoid arrhythmia since he has a history of nonsustained V. tach. Again probably not in proportion to his exertional dyspnea.  ST elevation myocardial infarction (STEMI) of anterior wall -- PCI to LAD  He has had several MIs, with moderate scar or Myoview.  However despite the scar, his EF is still preserved.  OSA (obstructive sleep apnea) He does have COPD and OSA tablet which could contribute to possible pulmonary hypertension. Therefore we will check a right heart catheterization with left heart catheterization coronary angiography.     Orders Placed This Encounter  Procedures  . CBC  . APTT  . Basic metabolic panel    Order Specific Question:  Has the patient fasted?    Answer:  Yes  . Protime-INR  . TSH  . Lipid panel    Order  Specific Question:  Has the patient fasted?    Answer:  Yes  . EKG 12-Lead  . LEFT AND RIGHT HEART CATHETERIZATION WITH CORONARY ANGIOGRAM   No orders of the defined types were placed in this encounter.    Followup: 3 months  DAVID W. Ellyn Hack, M.D., M.S. [facility] [address] [place], [place]  [id number]  [phone] Pager # [phone]

## 2013-07-24 NOTE — Assessment & Plan Note (Signed)
Better response from currently on his last evaluation. We have low-dose beta blocker to avoid arrhythmia since he has a history of nonsustained V. tach. Again probably not in proportion to his exertional dyspnea.

## 2013-07-24 NOTE — Assessment & Plan Note (Signed)
His most recent evaluation showed an EF that was relatively preserved, and she has some diastolic dysfunction but again proportion to the extent of his exertional dyspnea.

## 2013-07-24 NOTE — Assessment & Plan Note (Signed)
Last evaluation was nonischemic Myoview. He remains on aspirin plus Effient but says he missed a few months. Continue also with statins plus beta blocker and ARB.  At this point, with exertional dyspnea out of proportion to his COPD, we need to exclude CAD and pulmonary hypertension.  Plan: Right heart cath was PCI.

## 2013-07-24 NOTE — Assessment & Plan Note (Signed)
He has had several MIs, with moderate scar or Myoview.  However despite the scar, his EF is still preserved.

## 2013-07-24 NOTE — Assessment & Plan Note (Signed)
His current chest pain does not sound anginal, but in the setting of exertional dyspnea is somewhat concerned this afternoon with worsening exertional dyspnea. He does not remember having angina per se his previous MIs.

## 2013-07-24 NOTE — Assessment & Plan Note (Signed)
He does have COPD and OSA tablet which could contribute to possible pulmonary hypertension. Therefore we will check a right heart catheterization with left heart catheterization coronary angiography.

## 2013-07-25 ENCOUNTER — Telehealth: Payer: Self-pay | Admitting: Cardiology

## 2013-07-25 ENCOUNTER — Encounter (HOSPITAL_COMMUNITY): Payer: Self-pay | Admitting: Pharmacy Technician

## 2013-07-25 NOTE — Telephone Encounter (Signed)
Wants to know if patient needs to fast for lipid panel

## 2013-07-25 NOTE — Telephone Encounter (Signed)
RN spoke with lab technician. Patient should be fasting for lipid - per AVS

## 2013-07-26 ENCOUNTER — Other Ambulatory Visit: Payer: Self-pay | Admitting: *Deleted

## 2013-07-26 DIAGNOSIS — Z01818 Encounter for other preprocedural examination: Secondary | ICD-10-CM

## 2013-07-26 DIAGNOSIS — R079 Chest pain, unspecified: Secondary | ICD-10-CM

## 2013-07-26 DIAGNOSIS — R0609 Other forms of dyspnea: Principal | ICD-10-CM

## 2013-07-26 DIAGNOSIS — I255 Ischemic cardiomyopathy: Secondary | ICD-10-CM

## 2013-07-26 DIAGNOSIS — R0600 Dyspnea, unspecified: Secondary | ICD-10-CM

## 2013-07-26 LAB — CBC
HCT: 46.4 % (ref 39.0–52.0)
Hemoglobin: 16.2 g/dL (ref 13.0–17.0)
MCH: 29.8 pg (ref 26.0–34.0)
MCHC: 34.9 g/dL (ref 30.0–36.0)
MCV: 85.5 fL (ref 78.0–100.0)
Platelets: 357 10*3/uL (ref 150–400)
RBC: 5.43 MIL/uL (ref 4.22–5.81)
RDW: 14.2 % (ref 11.5–15.5)
WBC: 13 10*3/uL — ABNORMAL HIGH (ref 4.0–10.5)

## 2013-07-26 NOTE — Progress Notes (Signed)
Verbal order from Dr Ellyn Hack- schedule echo for patient. The study maybe done before or after cardiac cath. Scheduler will notify patient.

## 2013-07-27 LAB — BASIC METABOLIC PANEL
BUN: 10 mg/dL (ref 6–23)
CO2: 22 mEq/L (ref 19–32)
Calcium: 9 mg/dL (ref 8.4–10.5)
Chloride: 103 mEq/L (ref 96–112)
Creat: 0.89 mg/dL (ref 0.50–1.35)
Glucose, Bld: 170 mg/dL — ABNORMAL HIGH (ref 70–99)
Potassium: 4.4 mEq/L (ref 3.5–5.3)
Sodium: 137 mEq/L (ref 135–145)

## 2013-07-27 LAB — LIPID PANEL
Cholesterol: 148 mg/dL (ref 0–200)
HDL: 31 mg/dL — ABNORMAL LOW (ref >39)
LDL Cholesterol: 62 mg/dL (ref 0–99)
Total CHOL/HDL Ratio: 4.8 Ratio
Triglycerides: 275 mg/dL — ABNORMAL HIGH (ref <150)
VLDL: 55 mg/dL — ABNORMAL HIGH (ref 0–40)

## 2013-07-27 LAB — APTT: aPTT: 32 seconds (ref 24–37)

## 2013-07-27 LAB — TSH: TSH: 2.545 u[IU]/mL (ref 0.350–4.500)

## 2013-07-27 LAB — PROTIME-INR
INR: 0.93 (ref <1.50)
Prothrombin Time: 12.4 seconds (ref 11.6–15.2)

## 2013-07-30 ENCOUNTER — Encounter (HOSPITAL_COMMUNITY): Payer: Self-pay

## 2013-07-30 ENCOUNTER — Encounter (HOSPITAL_COMMUNITY)
Admission: RE | Disposition: A | Discharge status: Home / Self Care | Payer: BC Managed Care – PPO | Source: Ambulatory Visit | Attending: Cardiology

## 2013-07-30 ENCOUNTER — Ambulatory Visit (HOSPITAL_COMMUNITY)
Admission: RE | Admit: 2013-07-30 | Discharge: 2013-07-31 | Disposition: A | Discharge status: Home / Self Care | Payer: BC Managed Care – PPO | Source: Ambulatory Visit | Attending: Cardiology | Admitting: Cardiology

## 2013-07-30 DIAGNOSIS — Z860100 Personal history of colon polyps, unspecified: Secondary | ICD-10-CM | POA: Insufficient documentation

## 2013-07-30 DIAGNOSIS — Z720 Tobacco use: Secondary | ICD-10-CM

## 2013-07-30 DIAGNOSIS — Z87891 Personal history of nicotine dependence: Secondary | ICD-10-CM | POA: Insufficient documentation

## 2013-07-30 DIAGNOSIS — Z9861 Coronary angioplasty status: Secondary | ICD-10-CM | POA: Insufficient documentation

## 2013-07-30 DIAGNOSIS — J449 Chronic obstructive pulmonary disease, unspecified: Secondary | ICD-10-CM | POA: Diagnosis present

## 2013-07-30 DIAGNOSIS — Z7982 Long term (current) use of aspirin: Secondary | ICD-10-CM | POA: Insufficient documentation

## 2013-07-30 DIAGNOSIS — R7309 Other abnormal glucose: Secondary | ICD-10-CM | POA: Insufficient documentation

## 2013-07-30 DIAGNOSIS — J4489 Other specified chronic obstructive pulmonary disease: Secondary | ICD-10-CM | POA: Insufficient documentation

## 2013-07-30 DIAGNOSIS — Z955 Presence of coronary angioplasty implant and graft: Secondary | ICD-10-CM

## 2013-07-30 DIAGNOSIS — J439 Emphysema, unspecified: Secondary | ICD-10-CM

## 2013-07-30 DIAGNOSIS — G4733 Obstructive sleep apnea (adult) (pediatric): Secondary | ICD-10-CM

## 2013-07-30 DIAGNOSIS — I209 Angina pectoris, unspecified: Secondary | ICD-10-CM | POA: Insufficient documentation

## 2013-07-30 DIAGNOSIS — I2589 Other forms of chronic ischemic heart disease: Secondary | ICD-10-CM | POA: Insufficient documentation

## 2013-07-30 DIAGNOSIS — R0609 Other forms of dyspnea: Secondary | ICD-10-CM

## 2013-07-30 DIAGNOSIS — I252 Old myocardial infarction: Secondary | ICD-10-CM | POA: Insufficient documentation

## 2013-07-30 DIAGNOSIS — E785 Hyperlipidemia, unspecified: Secondary | ICD-10-CM

## 2013-07-30 DIAGNOSIS — I200 Unstable angina: Secondary | ICD-10-CM

## 2013-07-30 DIAGNOSIS — I255 Ischemic cardiomyopathy: Secondary | ICD-10-CM

## 2013-07-30 DIAGNOSIS — E1169 Type 2 diabetes mellitus with other specified complication: Secondary | ICD-10-CM | POA: Diagnosis present

## 2013-07-30 DIAGNOSIS — R739 Hyperglycemia, unspecified: Secondary | ICD-10-CM | POA: Diagnosis present

## 2013-07-30 DIAGNOSIS — R0989 Other specified symptoms and signs involving the circulatory and respiratory systems: Secondary | ICD-10-CM | POA: Insufficient documentation

## 2013-07-30 DIAGNOSIS — I5189 Other ill-defined heart diseases: Secondary | ICD-10-CM

## 2013-07-30 DIAGNOSIS — I2510 Atherosclerotic heart disease of native coronary artery without angina pectoris: Secondary | ICD-10-CM

## 2013-07-30 DIAGNOSIS — Z01818 Encounter for other preprocedural examination: Secondary | ICD-10-CM

## 2013-07-30 DIAGNOSIS — R0600 Dyspnea, unspecified: Secondary | ICD-10-CM | POA: Diagnosis present

## 2013-07-30 DIAGNOSIS — I25119 Atherosclerotic heart disease of native coronary artery with unspecified angina pectoris: Secondary | ICD-10-CM

## 2013-07-30 DIAGNOSIS — I10 Essential (primary) hypertension: Secondary | ICD-10-CM

## 2013-07-30 HISTORY — PX: PERCUTANEOUS CORONARY STENT INTERVENTION (PCI-S): SHX5485

## 2013-07-30 HISTORY — DX: Chronic obstructive pulmonary disease, unspecified: J44.9

## 2013-07-30 HISTORY — PX: LEFT AND RIGHT HEART CATHETERIZATION WITH CORONARY ANGIOGRAM: SHX5449

## 2013-07-30 LAB — POCT I-STAT 3, ART BLOOD GAS (G3+)
Acid-base deficit: 4 mmol/L — ABNORMAL HIGH (ref 0.0–2.0)
Bicarbonate: 21.7 mEq/L (ref 20.0–24.0)
O2 Saturation: 94 %
TCO2: 23 mmol/L (ref 0–100)
pCO2 arterial: 39.5 mmHg (ref 35.0–45.0)
pH, Arterial: 7.348 — ABNORMAL LOW (ref 7.350–7.450)
pO2, Arterial: 74 mmHg — ABNORMAL LOW (ref 80.0–100.0)

## 2013-07-30 LAB — POCT I-STAT 3, VENOUS BLOOD GAS (G3P V)
Acid-base deficit: 3 mmol/L — ABNORMAL HIGH (ref 0.0–2.0)
Bicarbonate: 22.8 mEq/L (ref 20.0–24.0)
O2 Saturation: 69 %
TCO2: 24 mmol/L (ref 0–100)
pCO2, Ven: 42.7 mmHg — ABNORMAL LOW (ref 45.0–50.0)
pH, Ven: 7.336 — ABNORMAL HIGH (ref 7.250–7.300)
pO2, Ven: 38 mmHg (ref 30.0–45.0)

## 2013-07-30 LAB — POCT ACTIVATED CLOTTING TIME
Activated Clotting Time: 321 seconds
Activated Clotting Time: 535 seconds

## 2013-07-30 SURGERY — LEFT AND RIGHT HEART CATHETERIZATION WITH CORONARY ANGIOGRAM
Anesthesia: LOCAL

## 2013-07-30 MED ORDER — HEPARIN SODIUM (PORCINE) 1000 UNIT/ML IJ SOLN
INTRAMUSCULAR | Status: AC
  Filled 2013-07-30: qty 1

## 2013-07-30 MED ORDER — MORPHINE SULFATE 2 MG/ML IJ SOLN
2.0000 mg | INTRAMUSCULAR | Status: DC | PRN
  Administered 2013-07-30: 15:00:00 2 mg via INTRAVENOUS
  Filled 2013-07-30: qty 1

## 2013-07-30 MED ORDER — SODIUM CHLORIDE 0.9 % IV SOLN
1.0000 mL/kg/h | INTRAVENOUS | Status: AC
  Administered 2013-07-30: 88.5 mL/h via INTRAVENOUS

## 2013-07-30 MED ORDER — ASPIRIN EC 81 MG PO TBEC
162.0000 mg | DELAYED_RELEASE_TABLET | ORAL | Status: DC
  Filled 2013-07-30: qty 2

## 2013-07-30 MED ORDER — HEPARIN (PORCINE) IN NACL 2-0.9 UNIT/ML-% IJ SOLN
INTRAMUSCULAR | Status: AC
  Filled 2013-07-30: qty 1000

## 2013-07-30 MED ORDER — MIDAZOLAM HCL 2 MG/2ML IJ SOLN
INTRAMUSCULAR | Status: AC
Start: 2013-07-30 — End: 2013-07-30
  Filled 2013-07-30: qty 2

## 2013-07-30 MED ORDER — NIACIN ER (ANTIHYPERLIPIDEMIC) 500 MG PO TBCR
500.0000 mg | EXTENDED_RELEASE_TABLET | Freq: Every day | ORAL | Status: DC
  Administered 2013-07-30: 500 mg via ORAL
  Filled 2013-07-30 (×2): qty 1

## 2013-07-30 MED ORDER — SODIUM CHLORIDE 0.9 % IV SOLN
INTRAVENOUS | Status: DC
  Administered 2013-07-30: 07:00:00 via INTRAVENOUS

## 2013-07-30 MED ORDER — FENTANYL CITRATE 0.05 MG/ML IJ SOLN
INTRAMUSCULAR | Status: AC
  Filled 2013-07-30: qty 2

## 2013-07-30 MED ORDER — BIVALIRUDIN 250 MG IV SOLR
INTRAVENOUS | Status: AC
  Filled 2013-07-30: qty 250

## 2013-07-30 MED ORDER — HEPARIN (PORCINE) IN NACL 2-0.9 UNIT/ML-% IJ SOLN
INTRAMUSCULAR | Status: AC
  Filled 2013-07-30: qty 500

## 2013-07-30 MED ORDER — ADENOSINE 12 MG/4ML IV SOLN
16.0000 mL | Freq: Once | INTRAVENOUS | Status: DC
  Filled 2013-07-30: qty 16

## 2013-07-30 MED ORDER — NITROGLYCERIN 0.2 MG/ML ON CALL CATH LAB
INTRAVENOUS | Status: AC
  Filled 2013-07-30: qty 1

## 2013-07-30 MED ORDER — VERAPAMIL HCL 2.5 MG/ML IV SOLN
INTRAVENOUS | Status: AC
  Filled 2013-07-30: qty 2

## 2013-07-30 MED ORDER — SODIUM CHLORIDE 0.9 % IJ SOLN: 3.0000 mL | Freq: Two times a day (BID) | INTRAMUSCULAR | Status: DC

## 2013-07-30 MED ORDER — ASPIRIN 81 MG PO CHEW
CHEWABLE_TABLET | ORAL | Status: AC
  Filled 2013-07-30: qty 1

## 2013-07-30 MED ORDER — METOPROLOL SUCCINATE 12.5 MG HALF TABLET
12.5000 mg | ORAL_TABLET | Freq: Every day | ORAL | Status: DC
  Administered 2013-07-30 – 2013-07-31 (×2): 12.5 mg via ORAL
  Filled 2013-07-30 (×2): qty 1

## 2013-07-30 MED ORDER — LIDOCAINE HCL (PF) 1 % IJ SOLN
INTRAMUSCULAR | Status: AC
  Filled 2013-07-30: qty 30

## 2013-07-30 MED ORDER — LOSARTAN POTASSIUM 25 MG PO TABS
25.0000 mg | ORAL_TABLET | Freq: Every day | ORAL | Status: DC
  Administered 2013-07-30 – 2013-07-31 (×2): 25 mg via ORAL
  Filled 2013-07-30 (×2): qty 1

## 2013-07-30 MED ORDER — PRASUGREL HCL 10 MG PO TABS
10.0000 mg | ORAL_TABLET | Freq: Every day | ORAL | Status: DC
  Administered 2013-07-31: 10 mg via ORAL
  Filled 2013-07-30: qty 1

## 2013-07-30 MED ORDER — BIVALIRUDIN 250 MG IV SOLR
0.2500 mg/kg/h | INTRAVENOUS | Status: DC
  Filled 2013-07-30: qty 250

## 2013-07-30 MED ORDER — SODIUM CHLORIDE 0.9 % IJ SOLN: 3.0000 mL | INTRAMUSCULAR | Status: DC | PRN

## 2013-07-30 MED ORDER — DIAZEPAM 5 MG PO TABS
ORAL_TABLET | ORAL | Status: AC
  Filled 2013-07-30: qty 1

## 2013-07-30 MED ORDER — ASPIRIN EC 81 MG PO TBEC: 162.0000 mg | DELAYED_RELEASE_TABLET | Freq: Every day | ORAL | Status: DC

## 2013-07-30 MED ORDER — DIAZEPAM 5 MG PO TABS
5.0000 mg | ORAL_TABLET | Freq: Once | ORAL | Status: AC
  Administered 2013-07-30: 5 mg via ORAL

## 2013-07-30 MED ORDER — FLUTICASONE FUROATE-VILANTEROL 100-25 MCG/INH IN AEPB: 1.0000 | INHALATION_SPRAY | Freq: Every day | RESPIRATORY_TRACT | Status: DC | PRN

## 2013-07-30 MED ORDER — SODIUM CHLORIDE 0.9 % IV SOLN: 250.0000 mL | INTRAVENOUS | Status: DC | PRN

## 2013-07-30 MED ORDER — BUPROPION HCL ER (SR) 150 MG PO TB12
150.0000 mg | ORAL_TABLET | Freq: Every day | ORAL | Status: DC
  Administered 2013-07-30 – 2013-07-31 (×2): 150 mg via ORAL
  Filled 2013-07-30 (×2): qty 1

## 2013-07-30 MED ORDER — MIDAZOLAM HCL 2 MG/2ML IJ SOLN
INTRAMUSCULAR | Status: AC
  Filled 2013-07-30: qty 2

## 2013-07-30 NOTE — Interval H&P Note (Signed)
History and Physical Interval Note:  07/30/2013 9:53 AM  Troy Parrish  has presented today for surgery, with the diagnosis of Class II Dyspnea.    The various methods of treatment have been discussed with the patient and family. After consideration of risks, benefits and other options for treatment, the patient has consented to  Procedure(s): LEFT AND RIGHT HEART CATHETERIZATION WITH CORONARY ANGIOGRAM (N/A) as a surgical intervention .  The patient's history has been reviewed, patient examined, no change in status, stable for surgery.  I have reviewed the patient's chart and labs.  Questions were answered to the patient's satisfaction.     HARDING,DAVID W  Cath Lab Visit (complete for each Cath Lab visit)  Clinical Evaluation Leading to the Procedure:   ACS: no  Non-ACS:    Anginal Classification: CCS III  Anti-ischemic medical therapy: Maximal Therapy (2 or more classes of medications)  Non-Invasive Test Results: Equivocal test results - Normal Myoview, but abnormal CPET test  Prior CABG: No previous CABG

## 2013-07-30 NOTE — Brief Op Note (Signed)
   NAME:  Troy Parrish   MRN: [id number] DOB:  April 23, 1960   ADMIT DATE: 07/30/2013  Brief Cardia Catheterization Note:  Indication: 1. Class III Angina- Dyspnea 2. Known CAD  Procedures: 1. Left & Right Heart Catheterization with Native Coroanry Angiography via Left Radial Artery & Left Brachial Veina access 2. 2 Site PCI of proximal & distal-mid LAD with 2 Xience Alpine DES (2.25 mm x 28 mm - d; 2.5 mm x 12 mm p)  Medications:  3 mg Versed IV; 150 mcg Fentanyl IV  IV Angiomax  IC NTG 200 mcg x 2  Contrast 165 ml  Impression:  Severe LAD & RCA lesions (LAD prox & distal-mid involving prior stents & dRCA)  Successful 2 site PCI of LAD  Mod-Severely reduced CO & CI (3.25/1.55)  Hemodynamics:  Normal pressures  Central AoP: 94/70/16mmHg  LVP/EDP: 101/5/12 mmHg  Recommendations:  Anticipate d/c in AM  Check 2 D Echo given low CO/CI  Lifelong DAPT  Schedule for Staged PCI (FFR Guided) of dRCA  Continue other cardiac meds.  Full note to follow  Leonie Man, M.D., M.S. Interventional Cardiologist   Pager # [phone] 07/30/2013    07/30/2013 1:38 PM

## 2013-07-30 NOTE — H&P (View-Only) (Signed)
PATIENT: Troy Parrish MRN: [id number]  DOB: [birth year]   DOV:07/24/2013 PCP: Letta Median, MD  Clinic Note: Chief Complaint  Patient presents with  . Follow-up    3 months follow-up, pain started at shoulder from the left side to ride side then into the back.    HPI: Troy Parrish is a 53 y.o. male with a PMH below who presents today for six-month followup. I last saw him in the beginning of November. He has had persistent dyspnea since his most recent MI a couple years ago.  He has been evaluated with a CPET test on which he did quite poorly with a peak VO2 of 53% initially. A followup study this spring showed significant improvement with better overall effort and a peak VO2 increased to 63%. He was only able to get his heart rate up to 81% of predicted showing some chronotropic incompetence. The other is seen, and it was that his pulmonary function tests were quite low, with FVC, and FEV1 consistent with COPD - he has seen Dr. Halford Chessman, who has told him that his dyspnea is out of proportion with his lung disease. He is started on CPAP for sleep apnea. He is referred back to cardiology.  CARDIAC HISTORY:   Anterior MI with a PCI to the LAD in 2001.   Unstable Angina 2006: LCx-OM1 stenosis --> PCI with 2.5-x-13-mm Cypher DES by Dr. Dorris Fetch in [place], [place].   His former cardiologist was Dr. Remi Haggard.     He subsequently moved to [place], [place].   Inferolateral- Posterior STEMI in May 2013: occluded RCA treated --> Integrity Resolute DES 2.75-x-18-mm stent (postdilated to 3.1 mm)  EF by Echo: 50% to 55% with grade 1 diastolic dysfunction and mild hypokinesis of the inferolateral myocardium.  Non-Invasive Evaluations:  CPET-MET tctober 1, 2013:  submaximal effort reaching only 1.05 RER as opposed to 1.09 and with this, his peak VO2 was 51% -> borderline severe. He was noted to have chronotropic incompetence with his heart rate only going from the low 80s up to roughly 100.  For that, his beta blocker was decreased.    TM Myoview Nov 30, 2011: 9 min; 10 METS; HR up to 169 bpm.  Medium-size moderate-intensity perfusion defect in the basal inferolateral, mid inferolateral walls with minimal reversibility suggestive of an old infarct with mild peri-infarct ischemia. Read as a low-risk scan.   Inferolateral Infarction would go along with known RCA & Cx territory disease/MIs.  CPET-MET 2014: Peak VO2 63%, better HR response.  Interval History: He continues to have his exertional dyspnea stating that simply caring about her groceries up a flight of steps makes him have to stop to catch his breath for 5 minutes. Doing routine chores around the house makes him short of breath. He is also now started noting some discomfort across the upper portion of his chest that is sometimes in other times not associated with exertion. He says his exertional dyspnea is getting worse and considerably limiting his ability to do anything. He is very concerned because of the latter he made some errors in judgment in his finances and therefore was unable to obtain medications for several months. He is now back on his medications and is very concerned that he could have some stent thrombosis. He does have orthopnea and no real PND. No significant edema. Otherwise he denies any significant palpitations or rapid heart beats. No significant orthostatic symptoms. No lightheadedness, dizziness weakness or syncope/near-syncope. No TIA or amaurosis fugax symptoms.  No melena, hematochezia or hematuria. No claudication symptoms.  Past Medical History  Diagnosis Date  . HTN (hypertension) 07/07/2011  . History of colon polyps   . Hyperlipidemia 07/07/2011  . Recurrent upper respiratory infection (URI)   . CAD S/P percutaneous coronary angioplasty 07/07/2011    Has had PCI to all 3 major vessels  . ST elevation myocardial infarction (STEMI) of anterior wall 2001    History of; prior cardiologist was Dr. Remi Haggard in [place]  . ST elevation myocardial infarction (STEMI) of inferior wall 07/07/2011    Occluded RCA - Integrity Resolute DES 2.75 mm 18 mm (3.1 mm)  . Unstable angina  2006    Cx-OM - PCI 2.5 mm 13 mm Cypher DES [name], [place])     Prior Cardiac Evaluation and Past Surgical History: Reviewed No Known Allergies Current Outpatient Prescriptions on File Prior to Visit  Medication Sig Dispense Refill  . albuterol (PROVENTIL HFA;VENTOLIN HFA) 108 (90 BASE) MCG/ACT inhaler Inhale 2 puffs into the lungs 2 (two) times daily.  1 Inhaler  3  . aspirin EC 81 MG tablet Take 162 mg by mouth at bedtime.      Marland Kitchen buPROPion (WELLBUTRIN SR) 150 MG 12 hr tablet Take 150 mg by mouth daily.      . Fluticasone Furoate-Vilanterol (BREO ELLIPTA) 100-25 MCG/INH AEPB Inhale 1 puff into the lungs daily.  28 each  5  . furosemide (LASIX) 20 MG tablet Take 1 tablet (20 mg total) by mouth daily.  30 tablet  5  . losartan (COZAAR) 25 MG tablet Take 1 tablet (25 mg total) by mouth daily.  90 tablet  3  . metoprolol succinate (TOPROL-XL) 25 MG 24 hr tablet Take 1/2 tab daily  30 tablet  6  . NIASPAN 500 MG CR tablet TAKE 1 TABLET AT BEDTIME 30 MINUTES AFTER ASPIRIN  30 tablet  6  . prasugrel (EFFIENT) 10 MG TABS tablet Take 1 tablet (10 mg total) by mouth daily.  30 tablet  5  . rosuvastatin (CRESTOR) 20 MG tablet Take 1 tablet (20 mg total) by mouth daily at 6 PM.  30 tablet  6  . triamcinolone (KENALOG) 0.025 % ointment Apply 1 application topically 2 (two) times daily.       No current facility-administered medications on file prior to visit.   Social History reviewed. He is still not working. He did recently quit smoking cigarettes a month ago.    ROS: A comprehensive Review of Systems - Negative except Exertional dyspnea noted above, but no significant daytime coughing. General ROS: positive for  - fatigue, malaise and sleep disturbance negative for - chills, fever, hot flashes, night sweats, weight  gain or weight loss Psychological ROS: positive for - decreased libido, depression, mood swings, sleep disturbances and Decrease sense of self worth, lack of appetite, anhedonia negative for -  concentration difficulties or suicidal ideation  PHYSICAL EXAM BP 128/83  Pulse 91  Ht 6' (1.829 m)  Wt 195 lb 11.2 oz (88.769 kg)  BMI 26.54 kg/m2 General appearance: alert, cooperative, appears stated age, no distress and He actually has more color than usual today and in somewhat better spirits. Well-nourished and well-groomed Neck: no adenopathy, no carotid bruit, no JVD and supple, symmetrical, trachea midline Lungs: diminished breath sounds bilaterally, wheezes bilaterally and Otherwise the most part CPAP. Nonlabored. Increased AP diameter. Prolonged expiratory phase Heart: RRR with ectopy, S1, S2 normal, no murmur, click, rub or gallop and normal apical impulse Abdomen: soft,  non-tender; bowel sounds normal; no masses,  no organomegaly and Mildly protuberant Extremities: extremities normal, atraumatic, no cyanosis or edema Pulses: 2+ and symmetric Neurologic: Mental status: Alert, oriented, thought content appropriate, affect: blunted and mood-congruent Cranial nerves: normal  PJK:[id number] today: Yes Rate: 91, Rhythm: NSR; low-voltage in limb leads;  nonspecific ST and T wave changes; otherwise normal/stable  ECG;  Recent Labs: None recorded  ASSESSMENT / PLAN: DOE (dyspnea on exertion) - Class III It is somewhat concerning in his exertional dyspnea is much worse that seems a bit over the last couple years. He never did get better after his MRI despite having a relatively benign Myoview and echo. At this point I don't think doing another stress test or echocardiogram can help from a diagnostic standpoint. I think the only way to evaluate this is with invasive study.  Plan: Right and left heart catheterization. Rule out pulmonary hypertension and coronary disease as well as diastolic heart  failure. Continue treatment COPD. Will likely need to increase her furosemide dosing based on RHC findings  Chest pain His current chest pain does not sound anginal, but in the setting of exertional dyspnea is somewhat concerned this afternoon with worsening exertional dyspnea. He does not remember having angina per se his previous MIs.  CAD S/P percutaneous coronary angioplasty Last evaluation was nonischemic Myoview. He remains on aspirin plus Effient but says he missed a few months. Continue also with statins plus beta blocker and ARB.  At this point, with exertional dyspnea out of proportion to his COPD, we need to exclude CAD and pulmonary hypertension.  Plan: Right heart cath was PCI.  Cardiomyopathy, ischemic; EF 50-55% with hypokinesis of the inferior and inferolateral myocardium His most recent evaluation showed an EF that was relatively preserved, and she has some diastolic dysfunction but again proportion to the extent of his exertional dyspnea.  Chronotropic incompetence with left ventricular dysfunction --> improved with reduction of beta blocker Better response from currently on his last evaluation. We have low-dose beta blocker to avoid arrhythmia since he has a history of nonsustained V. tach. Again probably not in proportion to his exertional dyspnea.  ST elevation myocardial infarction (STEMI) of anterior wall -- PCI to LAD  He has had several MIs, with moderate scar or Myoview.  However despite the scar, his EF is still preserved.  OSA (obstructive sleep apnea) He does have COPD and OSA tablet which could contribute to possible pulmonary hypertension. Therefore we will check a right heart catheterization with left heart catheterization coronary angiography.     Orders Placed This Encounter  Procedures  . CBC  . APTT  . Basic metabolic panel    Order Specific Question:  Has the patient fasted?    Answer:  Yes  . Protime-INR  . TSH  . Lipid panel    Order  Specific Question:  Has the patient fasted?    Answer:  Yes  . EKG 12-Lead  . LEFT AND RIGHT HEART CATHETERIZATION WITH CORONARY ANGIOGRAM   No orders of the defined types were placed in this encounter.    Followup: 3 months  DAVID W. Ellyn Hack, M.D., M.S. [facility] [address] [place], [place]  [address]  [phone] Pager # [phone]

## 2013-07-30 NOTE — CV Procedure (Signed)
CARDIAC CATHETERIZATION AND PERCUTANEOUS CORONARY INTERVENTION REPORT  NAME:  Troy Parrish   MRN: [id number] DOB:  03/15/1960   ADMIT DATE: 07/30/2013 Procedure Date: 07/30/2013  INTERVENTIONAL CARDIOLOGIST: Leonie Man, M.D., MS PRIMARY CARE PROVIDER: Letta Median, MD PRIMARY CARDIOLOGIST: Leonie Man, MD, MS  PATIENT:  Troy Parrish is a 53 y.o. male with a long PMH of CAD (Ant STEMI in 2001 - 2 LAD stents; UA in 2006 - Cx-OM Cypher DES 2.5 mm x 13 mm - 3.1 mm, & Inf STEMI 06/2011 - Resolute DES 2.75 mm x 18 mm - 3.1 mm  to ~mRCA), COPD (former long-term smoker), HTN, HLD who was seen in clinic on 6/16 with worsening exertional dyspnea - Class III associated with intermittent chest pressure.  Extreme reduction in exercise tolerance.  Had been off of medications x ~2-3 months (insurance issues) over the winter.   In the past year, he has been evaluated with a non-ischemic Myoview that contrasts with a significantly abnormal CPET (exercise tolerance test - peak VO2 score of 35-45%) complicated by chronotropic incompetence (improved with reduction of BB dose - but not stopped BB due to h/o NSVT) & COPD.  He also has OSA with concern for possible Pulm HTN,..  Based upon the differing results & acutely worsening symptoms of exertional dyspnea to the Class III-IV level, we determined that the most prudent COA was to proceed with R&LHC.   PRE-OPERATIVE DIAGNOSIS:    Class III Exertional Dyspnea / Angina  Known CAD with multiple PCIs & MIs  OSA on CPAP  COPD  PROCEDURES PERFORMED:    Left & RIght Heart Catheterization with Native Coronary Angiography  via Left Radail Artery & Left Brachial Venous access.  PROCEDURE: The patient was brought to the [facility] in the fasting state and prepped and draped in the usual sterile fashion for Left Radial artery access. An 18 gauge IV was placed in the Left brachial/AC vein under sterile technique with  plan for Radial RHC.  A modified Allen's test was performed on the Left wrist demonstrating excellent collateral flow for radial access.   Sterile technique was used including antiseptics, cap, gloves, gown, hand hygiene, mask and sheet. Skin prep: Chlorhexidine.   Consent: Risks of procedure as well as the alternatives and risks of each were explained to the (patient/caregiver). Consent for procedure obtained.   Time Out: Verified patient identification, verified procedure, site/side was marked, verified correct patient position, special equipment/implants available, medications/allergies/relevent history reviewed, required imaging and test results available. Performed.  Access:  Left Radial Artery: 6 Fr sheath -- Seldinger technique using Angiocath Micropuncture Kit 10 mL radial cocktail IA; as initial images revealed a potential PCI target, IV Angiomax was used instead of IV Heparin. Left Brachial/Antecubital Vein: The existing 18-gauge IV was exchanged over a wire for a 5Fr  sheath  Right Heart Catheterization: 5 Fr Gordy Councilman catheter advanced under fluoroscopy with balloon inflated to the RA, RV, then PCWP-PA for hemodynamic measurement.  Simultaneous FA & PA blood gases checked for SaO2% to calculate FICK CO/CI  Simultaneous PCWP/LV & RV/LV pressures monitored with Angled Pigtail in LV.  Catheter removed completely out of the body with balloon deflated.  Left Heart Catheterization: 5Fr Catheters advanced or exchanged over a Long-Exchange Safety-J-wire; JR4 catheter advanced first.  LV Hemodynamics: TIG 4.0 Right  & Left Coronary Artery Cineangiography: TIG 4.0 Catheter   Sheath removed in the Cath Lab with TR band placed for hemostasis.  TR band:  1235 hours; 17 mL    EBL: < 10 ml, not including ABG and VBG samples   FINDINGS:  Hemodynamics:  Findings:   SaO2%  Pressures mmHg  Mean P  mmHg  EDP  mmHg   Right Atrium    10/8    7   Right Ventricle    25/1    4   Pulmonary Artery    55   22/7   16    PCWP    13/13   12    Central Aortic   94   91/70   80    Left Ventricle    101/5    12          Cardiac Output:   Cardiac Index:    Fick   3.25    1.55     Left Ventriculography: Deferred - will order echo   Coronary Anatomy:  Dominance: Right  Left Main: Large-caliber vessel that bifurcates distally into the LAD and Circumflex. Angiographically normal but with mild calcification LAD: Moderate large-caliber vessel with 2 stents to 1 just after a first septal perforator and 1 after the third small diagonal branch. Just prior to the first bend there is a focal/napkin ring 95% stenosis just at the proximal edge of the stent and takeoff of SP1. The stent itself is a severe disease. Just distal to the stent there is a small 10% lesion. In a candy wrapper fashion on either side of the distal stent there is a 70% followed by 90% stenosis pre-and post stent with some distal ISR the rest of the LAD perfuses the apex and is a relatively small-caliber vessel.  D1: There are actually several small diagonal branches the first comes off just at the takeoff of SP1 second within this first stent and a third just prior to the second stent.  Left Circumflex: Large caliber, nondominant vessel it gives rise to small oozing disease OM1 bifurcating into a large lateral OM 2 and the AV groove circumflex. The AV groove circumflex continues on to give off a small marginal branch and a small posterior lateral segment.  OM2: Large-caliber major obtuse marginal branch that has a patent proximal stent to there is a distal branch that has a 95% stenosis in a 1.5 mm vessel. Beyond that branch the vessel then continues down to the inferolateral apex.  Minimal luminal irregularities throughout the entire circumflex system.   RCA: Large caliber, dominant vessel with a patent proximal stent at as roughly 10% ISR to in the distal vessel prior to the bifurcation there is a eccentric roughly 70% stenosis.  The vessel normalizes before giving off a tandem PDA with a larger second vessel being the main PDA that has diffuse 50-60% stenoses. The Right Posterior AV Groove Branch (RPAV) bifurcates into 2 major left posterior lateral branches, the most distal has a 90% stenosis in the ostium and a roughly 1.52 mm vessel. Not likely PCI amenable (potentially angioplasty only)   After reviewing the initial angiography, the culprit lesion was thought to be sequential/segmental stenoses in the LAD involving the proximal and distal stent. The RCA was also thought to be potential significance but was not as severe as the LAD lesions..  Preparation were made to proceed with PCI on the LAD lesions.  Percutaneous Coronary Intervention:    Lesion #1: Proximal LAD 90% stenosis reduced to 0%; TIMI-3 flow pre-and post   Lesion #2: Candy wrapper 70% then 90% stenoses involving the distal-mid  LAD stent - reduced to 0% (stent placed in the lesion #2 first); TIMI-3 flow pre-and post  Guide: 6 Fr   XB LAD  Guidewire: P[place]ater  Lesion #1 Predilation Balloon: Euphora 2.0 mm x 15 mm; -1 short low inflation to allow for the balloon passed distally  10 Atm x 30 Sec x2 Stent: Xience alpine DES 2.5 mm x 12 mm;   16 Atm x 45 Sec,  Post-dilation Balloon: [place] Trek 2.75 mm x 8 mm;   12 Atm x 45 Sec,  Final Diameter: 2.75 mm   Lesion #2 Predilation Balloon: Euphora 2.0 mm x 15 mm;   10 Atm x 30 Sec x2  Stent: Xience alpine DES 2.25 mm x 28 mm;   12 Atm x 30 Sec,   Post-dilation Balloon: [place] Trek 2.5 mm x 12 mm;   10 Atm x 45 Sec -- distal  15 Atm x 45 Sec -- proximal  Final Diameter: 2.65 mm  Post-dilation Balloon: [place] Trek 2.75 mm x 8 mm;   10 Atm x 45 Sec - distal to proximal  Final Diameter: 2.70 mm  Post deployment angiography in multiple views, with and without guidewire in place revealed excellent stent deployment and lesion coverage.  There was no evidence of dissection or  perforation.  MEDICATIONS:  Anesthesia:  Local Lidocaine 4 ml  Sedation:  3 mg IV Versed, 150 mcg IV fentanyl ;   Omnipaque Contrast: 165 ml  Anticoagulation:  Angiomax Bolus & drip  Anti-Platelet Agent:  On Effient  PATIENT DISPOSITION:    The patient was transferred to the PACU holding area in a hemodynamicaly stable, chest pain free condition.  The patient tolerated the procedure well, and there were no complications.  EBL:   < 20 ml  The patient was stable before, during, and after the procedure.  POST-OPERATIVE DIAGNOSIS:    Severe Disease in multiple sites of the LAD - proximal & mid involving the previously placed stents from 2001.  Moderate to severe distal RCA disease with severe disease in the small caliber RPL system  Normal RHC pressures with moderate to severely reduced Cardiac Output/Index.  PLAN OF CARE:  Standard post Radial Cath Care -- Overnight in [place].  Anticipate d/c in AM, will need to schedule staged procedure for RCA lesion with me on Friday 6/26 -- will do FFR of RCA +/- PCI.  Continue DAPT - lifelong.  Continue Cardiac medications on discharge  Had planned on OP Echo, but in light of low CO - will order while here prior to d/c.   Leonie Man, M.D., M.S. [facility] [address] [place],   [address]  [phone]  07/30/2013 12:34 PM

## 2013-07-30 NOTE — Progress Notes (Signed)
TR BAND REMOVAL  LOCATION:    left radial  DEFLATED PER PROTOCOL:    yes  TIME BAND OFF / DRESSING APPLIED:    1645   SITE UPON ARRIVAL:    Level 0  SITE AFTER BAND REMOVAL:    Level 0  REVERSE ALLEN'S TEST:     positive  CIRCULATION SENSATION AND MOVEMENT:    Within Normal Limits   yes  COMMENTS:   tolerayed procedure well

## 2013-07-31 ENCOUNTER — Encounter (HOSPITAL_COMMUNITY): Payer: Self-pay | Admitting: Physician Assistant

## 2013-07-31 ENCOUNTER — Other Ambulatory Visit: Payer: Self-pay | Admitting: Physician Assistant

## 2013-07-31 ENCOUNTER — Telehealth: Payer: Self-pay | Admitting: Physician Assistant

## 2013-07-31 DIAGNOSIS — I2589 Other forms of chronic ischemic heart disease: Secondary | ICD-10-CM

## 2013-07-31 DIAGNOSIS — I517 Cardiomegaly: Secondary | ICD-10-CM

## 2013-07-31 DIAGNOSIS — F17200 Nicotine dependence, unspecified, uncomplicated: Secondary | ICD-10-CM

## 2013-07-31 DIAGNOSIS — E785 Hyperlipidemia, unspecified: Secondary | ICD-10-CM

## 2013-07-31 DIAGNOSIS — I25110 Atherosclerotic heart disease of native coronary artery with unstable angina pectoris: Secondary | ICD-10-CM

## 2013-07-31 DIAGNOSIS — I10 Essential (primary) hypertension: Secondary | ICD-10-CM

## 2013-07-31 DIAGNOSIS — Z9861 Coronary angioplasty status: Secondary | ICD-10-CM

## 2013-07-31 LAB — BASIC METABOLIC PANEL
BUN: 11 mg/dL (ref 6–23)
CO2: 23 mEq/L (ref 19–32)
Calcium: 9.1 mg/dL (ref 8.4–10.5)
Chloride: 100 mEq/L (ref 96–112)
Creatinine, Ser: 0.73 mg/dL (ref 0.50–1.35)
GFR calc Af Amer: >90 mL/min (ref >90)
GFR calc non Af Amer: >90 mL/min (ref >90)
Glucose, Bld: 161 mg/dL — ABNORMAL HIGH (ref 70–99)
Potassium: 4.1 mEq/L (ref 3.7–5.3)
Sodium: 138 mEq/L (ref 137–147)

## 2013-07-31 LAB — CBC
HCT: 42.7 % (ref 39.0–52.0)
Hemoglobin: 14.5 g/dL (ref 13.0–17.0)
MCH: 29.9 pg (ref 26.0–34.0)
MCHC: 34 g/dL (ref 30.0–36.0)
MCV: 88 fL (ref 78.0–100.0)
Platelets: 314 10*3/uL (ref 150–400)
RBC: 4.85 MIL/uL (ref 4.22–5.81)
RDW: 13.4 % (ref 11.5–15.5)
WBC: 12.7 10*3/uL — ABNORMAL HIGH (ref 4.0–10.5)

## 2013-07-31 LAB — HEMOGLOBIN A1C
Hgb A1c MFr Bld: 6.6 % — ABNORMAL HIGH (ref <5.7)
Mean Plasma Glucose: 143 mg/dL — ABNORMAL HIGH (ref <117)

## 2013-07-31 MED FILL — Sodium Chloride IV Soln 0.9%: INTRAVENOUS | Qty: 50 | Status: AC

## 2013-07-31 NOTE — Discharge Instructions (Signed)
Angina Pectoris Angina pectoris, often just called angina, is extreme discomfort in your chest, neck, or arm caused by a lack of blood in the middle and thickest layer of your heart wall (myocardium). It may feel like tightness or heavy pressure. It may feel like a crushing or squeezing pain. Some people say it feels like gas or indigestion. It may go down your shoulders, back, and arms. Some people may have symptoms other than pain. These symptoms include fatigue, shortness of breath, cold sweats, or nausea. There are four different types of angina:  Stable angina--Stable angina usually occurs in episodes of predictable frequency and duration. It usually is brought on by physical activity, emotional stress, or excitement. These are all times when the myocardium needs more oxygen. Stable angina usually lasts a few minutes and often is relieved by taking a medicine that can be taken under your tongue (sublingually). The medicine is called nitroglycerin. Stable angina is caused by a buildup of plaque inside the arteries, which restricts blood flow to the heart muscle (atherosclerosis).  Unstable angina--Unstable angina can occur even when your body experiences little or no physical exertion. It can occur during sleep. It can also occur at rest. It can suddenly increase in severity or frequency. It might not be relieved by sublingual nitroglycerin. It can last up to 30 minutes. The most common cause of unstable angina is a blood clot that has developed on the top of plaque buildup inside a coronary artery. It can lead to a heart attack if the blood clot completely blocks the artery.  Microvascular angina--This type of angina is caused by a disorder of tiny blood vessels called arterioles. Microvascular angina is more common in women. The pain may be more severe and last longer than other types of angina pectoris.  Prinzmetal or variant angina--This type of angina pectoris usually occurs when your body  experiences little or no physical exertion. It especially occurs in the early morning hours. It is caused by a spasm of your coronary artery. HOME CARE INSTRUCTIONS   Only take over-the-counter and prescription medicines as directed by your health care provider.  Stay active or increase your exercise as directed by your health care provider.  Limit strenuous activity as directed by your health care provider.  Limit heavy lifting as directed by your health care provider.  Maintain a healthy weight.  Learn about and eat heart-healthy foods.  Do not use any tobacco products including cigarettes, chewing tobacco or electronic cigarettes. SEEK IMMEDIATE MEDICAL CARE IF:  You experience the following symptoms:  Chest, neck, deep shoulder, or arm pain or discomfort that lasts more than a few minutes.  Chest, neck, deep shoulder, or arm pain or discomfort that goes away and comes back, repeatedly.  Heavy sweating with discomfort, without a noticeable cause.  Shortness of breath or difficulty breathing.  Angina that does not get better after a few minutes of rest or after taking sublingual nitroglycerin. These can all be symptoms of a heart attack, which is a medical emergency! Get medical help at once. Call your local emergency service (911 in [place] immediately. Do not  drive yourself to the hospital and do not  wait to for your symptoms to go away. MAKE SURE YOU:  Understand these instructions.  Will watch your condition.  Will get help right away if you are not doing well or get worse. Document Released: 01/25/2005 Document Revised: 01/30/2013 Document Reviewed: 11/04/2011 [facility] Patient Information 2015 [place], [place]. This information is not intended  to replace advice given to you by your health care provider. Make sure you discuss any questions you have with your health care provider. ° °

## 2013-07-31 NOTE — Discharge Summary (Signed)
Discharge Summary   Patient ID: Troy Parrish,  MRN: [id number], DOB/AGE: 53-Jan-1962 53 y.o.  Admit date: 07/30/2013 Discharge date: 07/31/2013  Primary Care Lanora Reveron: Letta Median Primary Cardiologist: Dr. Ellyn Hack  Discharge Diagnoses Principal Problem:   DOE (dyspnea on exertion) - Class III Active Problems:   Hyperglycemia   CAD S/P percutaneous coronary angioplasty   HTN (hypertension)   Hyperlipidemia   OSA (obstructive sleep apnea)   COPD (chronic obstructive pulmonary disease)   Tobacco abuse   Allergies No Known Allergies  Procedures  CARDIAC CATHETERIZATION AND PERCUTANEOUS CORONARY INTERVENTION REPORT  PROCEDURES PERFORMED:  Left & RIght Heart Catheterization with Native Coronary Angiography via Left Radail Artery & Left Brachial Venous access.  FINDINGS:  Hemodynamics:  Findings:   SaO2%  Pressures mmHg  Mean P  mmHg  EDP  mmHg   Right Atrium   10/8   7   Right Ventricle   25/1   4   Pulmonary Artery  55  22/7  16    PCWP   13/13  12    Central Aortic  94  91/70  80    Left Ventricle   101/5   12          Cardiac Output:   Cardiac Index:    Fick  3.25   1.55    Left Ventriculography: Deferred - will order echo  Coronary Anatomy:  Dominance: Right Left Main: Large-caliber vessel that bifurcates distally into the LAD and Circumflex. Angiographically normal but with mild calcification LAD: Moderate large-caliber vessel with 2 stents to 1 just after a first septal perforator and 1 after the third small diagonal branch. Just prior to the first bend there is a focal/napkin ring 95% stenosis just at the proximal edge of the stent and takeoff of SP1. The stent itself is a severe disease. Just distal to the stent there is a small 10% lesion. In a candy wrapper fashion on either side of the distal stent there is a 70% followed by 90% stenosis pre-and post stent with some distal ISR the rest of the LAD perfuses the apex and is a relatively small-caliber  vessel.  D1: There are actually several small diagonal branches the first comes off just at the takeoff of SP1 second within this first stent and a third just prior to the second stent. Left Circumflex: Large caliber, nondominant vessel it gives rise to small oozing disease OM1 bifurcating into a large lateral OM 2 and the AV groove circumflex. The AV groove circumflex continues on to give off a small marginal branch and a small posterior lateral segment.  OM2: Large-caliber major obtuse marginal branch that has a patent proximal stent to there is a distal branch that has a 95% stenosis in a 1.5 mm vessel. Beyond that branch the vessel then continues down to the inferolateral apex.  Minimal luminal irregularities throughout the entire circumflex system. RCA: Large caliber, dominant vessel with a patent proximal stent at as roughly 10% ISR to in the distal vessel prior to the bifurcation there is a eccentric roughly 70% stenosis. The vessel normalizes before giving off a tandem PDA with a larger second vessel being the main PDA that has diffuse 50-60% stenoses. The Right Posterior AV Groove Branch (RPAV) bifurcates into 2 major left posterior lateral branches, the most distal has a 90% stenosis in the ostium and a roughly 1.52 mm vessel. Not likely PCI amenable (potentially angioplasty only)   POST-OPERATIVE DIAGNOSIS:  Severe Disease in multiple sites of  the LAD - proximal & mid involving the previously placed stents from 2001.  Moderate to severe distal RCA disease with severe disease in the small caliber RPL system  Normal RHC pressures with moderate to severely reduced Cardiac Output/Index. PLAN OF CARE:  Standard post Radial Cath Care -- Overnight in [place]. Anticipate d/c in AM, will need to schedule staged procedure for RCA lesion with me on Friday 6/26 -- will do FFR of RCA +/- PCI.  Continue DAPT - lifelong.  Continue Cardiac medications on discharge  Had planned on OP Echo, but in light of low  CO - will order while here prior to d/c.    Hospital Course  The patient is a 53 year old male with past medical history include hypertension, hyperlipidemia and coronary artery disease status post percutaneous coronary angioplasty in all 3 major vessel in 2013. Patient was seen on 07/24/2013 for six-month followup with Dr. Ellyn Hack at which time he complained of persistent dyspnea. He was seen by pulmonology and feels that his dyspnea is out of proportion with his COPD. His symptom is consistent with class III angina and after discussing with Dr. Ellyn Hack, he was scheduled for right and left heart catheterization on 07/30/2013. Cardiac catheterization revealed severe disease in multiple sites the LAD in previous placed stents in both proximal and mid LAD, moderate to severe distal RCA disease with severe disease the small caliber RPL system. Right heart cath revealed normal pressure with moderate to severely reduced cardiac output and index. The multivessel disease is best treated in a staged fashion to reduce contrast dye load and patient will be undergone a repeat cardiac catheterization on Friday, August 03, 2013. Echo was obtained prior to discharge to assess cardiac function which revealed EF 45% to 50%, inferior and posterior lateral hypokinesis, normal diastolic function, mildly dilated left atrium.  Patient was seen the morning of 07/31/2013, at which time he denies any significant chest discomfort or shortness of breath at rest. He is deemed stable for discharge after echocardiogram was done.   Discharge Vitals Blood pressure 133/86, pulse 80, temperature 97.8 F (36.6 C), temperature source Oral, resp. rate 18, height 6' (1.829 m), weight 196 lb 13.9 oz (89.3 kg), SpO2 97.00%.  Filed Weights   07/30/13 0653 07/31/13 0039  Weight: 195 lb (88.451 kg) 196 lb 13.9 oz (89.3 kg)    Labs  CBC  Recent Labs  07/31/13 0257  WBC 12.7*  HGB 14.5  HCT 42.7  MCV 88.0  PLT 595   Basic Metabolic  Panel  Recent Labs  07/31/13 0257  NA 138  K 4.1  CL 100  CO2 23  GLUCOSE 161*  BUN 11  CREATININE 0.73  CALCIUM 9.1    Disposition  Pt is being discharged home today in good condition.  Follow-up Plans & Appointments      Follow-up Information   Follow up with [facility] On 08/03/2013. (Present to [facility] Cath Lab on 08/03/2013 at no later than 7am for cardiac cath. Do not eat after midnight on the day of procedure. )       Discharge Medications    Medication List         aspirin EC 81 MG tablet  Take 162 mg by mouth at bedtime.     BREO ELLIPTA 100-25 MCG/INH Aepb  Generic drug:  Fluticasone Furoate-Vilanterol  Inhale 1 puff into the lungs daily as needed (for shortness of breath).     buPROPion 150 MG 12 hr tablet  Commonly known as:  WELLBUTRIN SR  Take 150 mg by mouth daily.     furosemide 20 MG tablet  Commonly known as:  LASIX  Take 1 tablet (20 mg total) by mouth daily.     losartan 25 MG tablet  Commonly known as:  COZAAR  Take 1 tablet (25 mg total) by mouth daily.     metoprolol succinate 25 MG 24 hr tablet  Commonly known as:  TOPROL-XL  Take 12.5 mg by mouth daily.     niacin 500 MG CR tablet  Commonly known as:  NIASPAN  Take 500 mg by mouth at bedtime. Takes 30 minutes after taking aspirin.     prasugrel 10 MG Tabs tablet  Commonly known as:  EFFIENT  Take 1 tablet (10 mg total) by mouth daily.     rosuvastatin 20 MG tablet  Commonly known as:  CRESTOR  Take 1 tablet (20 mg total) by mouth daily at 6 PM.        Duration of Discharge Encounter   Greater than 30 minutes including physician time.  Hilbert Corrigan PA 07/31/2013, 1:55 PM   Patient seen and examined and history reviewed. Agree with above findings and plan. See earlier rounding note. EF 45-50% by Echo.  Peter [place], [place] 07/31/2013 2:03 PM

## 2013-07-31 NOTE — Telephone Encounter (Addendum)
Note entered in error. Wrong patient

## 2013-07-31 NOTE — Progress Notes (Signed)
Patient Name: Troy Parrish Date of Encounter: 07/31/2013     Principal Problem:   DOE (dyspnea on exertion) - Class III Active Problems:   Hyperglycemia   CAD S/P percutaneous coronary angioplasty   HTN (hypertension)   Hyperlipidemia   OSA (obstructive sleep apnea)   COPD (chronic obstructive pulmonary disease)   Tobacco abuse    SUBJECTIVE  Denies any significant chest pain or SOB last night. SOb mainly with exertion. State he had some intermittent CP at home associated with exertion  CURRENT MEDS . aspirin EC  162 mg Oral Q24H  . buPROPion  150 mg Oral Daily  . losartan  25 mg Oral Daily  . metoprolol succinate  12.5 mg Oral Daily  . niacin  500 mg Oral QHS  . prasugrel  10 mg Oral Daily  . sodium chloride  3 mL Intravenous Q12H    OBJECTIVE  Filed Vitals:   07/30/13 2017 07/30/13 2200 07/31/13 0039 07/31/13 0613  BP: 136/81  120/74 120/81  Pulse: 77 86 84 80  Temp: 97.9 F (36.6 C)  98.2 F (36.8 C) 97.3 F (36.3 C)  TempSrc: Oral  Oral Oral  Resp: 16 18 18 20   Height:      Weight:   196 lb 13.9 oz (89.3 kg)   SpO2: 96% 97% 96% 96%    Intake/Output Summary (Last 24 hours) at 07/31/13 0718 Last data filed at 07/30/13 2233  Gross per 24 hour  Intake   1070 ml  Output    500 ml  Net    570 ml   Filed Weights   07/30/13 0653 07/31/13 0039  Weight: 195 lb (88.451 kg) 196 lb 13.9 oz (89.3 kg)    PHYSICAL EXAM  General: Pleasant, NAD. Neuro: Alert and oriented X 3. Moves all extremities spontaneously. Psych: Normal affect. HEENT:  Normal  Neck: Supple without bruits or JVD. Lungs:  Resp regular and unlabored, CTA. Heart: RRR no s3, s4, or murmurs. Abdomen: Soft, non-tender, non-distended, BS + x 4.  Extremities: No clubbing, cyanosis or edema. DP/PT/Radials 2+ and equal bilaterally. L radial cath site clean/dry/intact without bleeding or hematoma  Accessory Clinical Findings  CBC  Recent Labs  07/31/13 0257  WBC 12.7*  HGB 14.5  HCT 42.7   MCV 88.0  PLT 244   Basic Metabolic Panel  Recent Labs  07/31/13 0257  NA 138  K 4.1  CL 100  CO2 23  GLUCOSE 161*  BUN 11  CREATININE 0.73  CALCIUM 9.1    TELE  NSR with HR 70s, no significant ventricular ectopy  ECG  NSR with HR 70s, no significant ST-T wave changes  Radiology/Studies  No results found.  ASSESSMENT AND PLAN  1. Class III angina  - Left and R cath 07/30/2013 multiple severe dx in LAD involving previous stent in 2001, moderate to severe dx in RCA.   - RHC revealed normal pressure with moderate to severely reduced CO/CI  - plan for staged PCI to RCA with FFR with Dr.  Ellyn Hack on Fri 6/26  - continue DAPT lifelong  - pending echo, can be discharged after echo come back  2. CAD  - Anterior MI with a PCI to the LAD in 2001.   - Unstable Angina 2006: LCx-OM1 stenosis --> PCI with 2.5-x-13-mm Cypher DES by Dr. Dorris Fetch in [place], [place].   - Inferolateral- Posterior STEMI in May 2013: occluded RCA treated --> Integrity Resolute DES 2.75-x-18-mm stent (postdilated to 3.1 mm)  -  50% to 55% with grade 1 diastolic dysfunction and mild hypokinesis of the inferolateral myocardium.   3. HTN 4. Hyperlipidemia: 6/18 Trig 275, HDL 31, LDL 62, Chol 148  - follow up with PCP  5. COPD  6. OSA on CPAP 7. Hyperglycemia, check A1C while waiting for Echo  Hilbert Corrigan PA Pager: [phone]  Patient seen and examined and history reviewed. Agree with above findings and plan. No left radial or brachial hematoma. Feels better with less dyspnea on exertion. Blood sugar elevated at 161. A1c pending. Will check Echo today then plan to DC. Follow up with Dr. Ellyn Hack on 6/26 for FFR of RCA.  Jalila Goodnough [place], [place] 07/31/2013 10:07 AM

## 2013-07-31 NOTE — Progress Notes (Signed)
  Echocardiogram 2D Echocardiogram has been performed.  Diamond Nickel 07/31/2013, 1:27 PM

## 2013-07-31 NOTE — Progress Notes (Signed)
CARDIAC REHAB PHASE I   PRE:  Rate/Rhythm: 75 SR    BP: sitting 126/89    SaO2:   MODE:  Ambulation: 1000 ft   POST:  Rate/Rhythm: 99 SR    BP: sitting 133/86     SaO2:   Pt tolerated well. Sts he has been getting DOE with walking from car to house. No DOE today with 1000 ft. Ed completed with wife present. Good reception. Pt wants to do CRPII again in [place]. Will send referral. Discussed ways to decrease triglycerides. [phone]   Josephina Shih [place] CES, ACSM 07/31/2013 9:05 AM

## 2013-08-02 ENCOUNTER — Encounter: Payer: Self-pay | Admitting: Cardiology

## 2013-08-02 ENCOUNTER — Ambulatory Visit (HOSPITAL_COMMUNITY): Payer: BC Managed Care – PPO

## 2013-08-03 ENCOUNTER — Ambulatory Visit (HOSPITAL_COMMUNITY)
Admission: RE | Admit: 2013-08-03 | Discharge: 2013-08-03 | Disposition: A | Discharge status: Home / Self Care | Payer: BC Managed Care – PPO | Source: Ambulatory Visit | Attending: Cardiology | Admitting: Cardiology

## 2013-08-03 ENCOUNTER — Encounter (HOSPITAL_COMMUNITY)
Admission: RE | Disposition: A | Discharge status: Home / Self Care | Payer: BC Managed Care – PPO | Source: Ambulatory Visit | Attending: Cardiology

## 2013-08-03 ENCOUNTER — Encounter (HOSPITAL_COMMUNITY): Payer: Self-pay | Admitting: Cardiology

## 2013-08-03 DIAGNOSIS — Z87891 Personal history of nicotine dependence: Secondary | ICD-10-CM | POA: Insufficient documentation

## 2013-08-03 DIAGNOSIS — R0600 Dyspnea, unspecified: Secondary | ICD-10-CM

## 2013-08-03 DIAGNOSIS — E1169 Type 2 diabetes mellitus with other specified complication: Secondary | ICD-10-CM | POA: Diagnosis present

## 2013-08-03 DIAGNOSIS — I252 Old myocardial infarction: Secondary | ICD-10-CM | POA: Insufficient documentation

## 2013-08-03 DIAGNOSIS — Z7902 Long term (current) use of antithrombotics/antiplatelets: Secondary | ICD-10-CM | POA: Insufficient documentation

## 2013-08-03 DIAGNOSIS — I25119 Atherosclerotic heart disease of native coronary artery with unspecified angina pectoris: Secondary | ICD-10-CM

## 2013-08-03 DIAGNOSIS — I209 Angina pectoris, unspecified: Secondary | ICD-10-CM | POA: Insufficient documentation

## 2013-08-03 DIAGNOSIS — F17200 Nicotine dependence, unspecified, uncomplicated: Secondary | ICD-10-CM | POA: Diagnosis present

## 2013-08-03 DIAGNOSIS — J449 Chronic obstructive pulmonary disease, unspecified: Secondary | ICD-10-CM | POA: Insufficient documentation

## 2013-08-03 DIAGNOSIS — I2589 Other forms of chronic ischemic heart disease: Secondary | ICD-10-CM | POA: Insufficient documentation

## 2013-08-03 DIAGNOSIS — Z7982 Long term (current) use of aspirin: Secondary | ICD-10-CM | POA: Insufficient documentation

## 2013-08-03 DIAGNOSIS — I2510 Atherosclerotic heart disease of native coronary artery without angina pectoris: Secondary | ICD-10-CM | POA: Insufficient documentation

## 2013-08-03 DIAGNOSIS — I255 Ischemic cardiomyopathy: Secondary | ICD-10-CM

## 2013-08-03 DIAGNOSIS — J4489 Other specified chronic obstructive pulmonary disease: Secondary | ICD-10-CM | POA: Insufficient documentation

## 2013-08-03 DIAGNOSIS — I10 Essential (primary) hypertension: Secondary | ICD-10-CM | POA: Diagnosis present

## 2013-08-03 DIAGNOSIS — G4733 Obstructive sleep apnea (adult) (pediatric): Secondary | ICD-10-CM | POA: Diagnosis present

## 2013-08-03 DIAGNOSIS — I200 Unstable angina: Secondary | ICD-10-CM

## 2013-08-03 DIAGNOSIS — E785 Hyperlipidemia, unspecified: Secondary | ICD-10-CM | POA: Insufficient documentation

## 2013-08-03 DIAGNOSIS — G4730 Sleep apnea, unspecified: Secondary | ICD-10-CM | POA: Insufficient documentation

## 2013-08-03 DIAGNOSIS — I25110 Atherosclerotic heart disease of native coronary artery with unstable angina pectoris: Secondary | ICD-10-CM

## 2013-08-03 DIAGNOSIS — Z9861 Coronary angioplasty status: Secondary | ICD-10-CM | POA: Insufficient documentation

## 2013-08-03 DIAGNOSIS — R0609 Other forms of dyspnea: Secondary | ICD-10-CM | POA: Diagnosis present

## 2013-08-03 HISTORY — PX: LEFT HEART CATH: SHX5478

## 2013-08-03 LAB — POCT ACTIVATED CLOTTING TIME: Activated Clotting Time: 411 seconds

## 2013-08-03 LAB — BASIC METABOLIC PANEL
BUN: 16 mg/dL (ref 6–23)
CO2: 20 mEq/L (ref 19–32)
Calcium: 9.3 mg/dL (ref 8.4–10.5)
Chloride: 103 mEq/L (ref 96–112)
Creatinine, Ser: 0.78 mg/dL (ref 0.50–1.35)
GFR calc Af Amer: >90 mL/min (ref >90)
GFR calc non Af Amer: >90 mL/min (ref >90)
Glucose, Bld: 134 mg/dL — ABNORMAL HIGH (ref 70–99)
Potassium: 5 mEq/L (ref 3.7–5.3)
Sodium: 139 mEq/L (ref 137–147)

## 2013-08-03 LAB — CBC
HCT: 45.9 % (ref 39.0–52.0)
Hemoglobin: 15.9 g/dL (ref 13.0–17.0)
MCH: 30.2 pg (ref 26.0–34.0)
MCHC: 34.6 g/dL (ref 30.0–36.0)
MCV: 87.3 fL (ref 78.0–100.0)
Platelets: 358 10*3/uL (ref 150–400)
RBC: 5.26 MIL/uL (ref 4.22–5.81)
RDW: 13.5 % (ref 11.5–15.5)
WBC: 13.1 10*3/uL — ABNORMAL HIGH (ref 4.0–10.5)

## 2013-08-03 SURGERY — LEFT HEART CATH

## 2013-08-03 MED ORDER — HEPARIN (PORCINE) IN NACL 2-0.9 UNIT/ML-% IJ SOLN
INTRAMUSCULAR | Status: AC
  Filled 2013-08-03: qty 1000

## 2013-08-03 MED ORDER — ACETAMINOPHEN 325 MG PO TABS: 650.0000 mg | ORAL_TABLET | ORAL | Status: DC | PRN

## 2013-08-03 MED ORDER — LIDOCAINE HCL (PF) 1 % IJ SOLN
INTRAMUSCULAR | Status: AC
  Filled 2013-08-03: qty 30

## 2013-08-03 MED ORDER — OXYCODONE-ACETAMINOPHEN 5-325 MG PO TABS: 1.0000 | ORAL_TABLET | ORAL | Status: DC | PRN

## 2013-08-03 MED ORDER — ASPIRIN 81 MG PO CHEW
81.0000 mg | CHEWABLE_TABLET | ORAL | Status: AC
  Administered 2013-08-03: 81 mg via ORAL
  Filled 2013-08-03: qty 1

## 2013-08-03 MED ORDER — ONDANSETRON HCL 4 MG/2ML IJ SOLN: 4.0000 mg | Freq: Four times a day (QID) | INTRAMUSCULAR | Status: DC | PRN

## 2013-08-03 MED ORDER — FENTANYL CITRATE 0.05 MG/ML IJ SOLN
INTRAMUSCULAR | Status: AC
  Filled 2013-08-03: qty 2

## 2013-08-03 MED ORDER — PRASUGREL HCL 10 MG PO TABS
ORAL_TABLET | ORAL | Status: AC
  Filled 2013-08-03: qty 1

## 2013-08-03 MED ORDER — ADENOSINE 12 MG/4ML IV SOLN
16.0000 mL | Freq: Once | INTRAVENOUS | Status: AC
  Administered 2013-08-03: 48 mg via INTRAVENOUS
  Filled 2013-08-03: qty 16

## 2013-08-03 MED ORDER — SODIUM CHLORIDE 0.9 % IV SOLN
INTRAVENOUS | Status: DC
  Administered 2013-08-03: 1000 mL via INTRAVENOUS

## 2013-08-03 MED ORDER — NITROGLYCERIN 0.2 MG/ML ON CALL CATH LAB
INTRAVENOUS | Status: AC
  Filled 2013-08-03: qty 1

## 2013-08-03 MED ORDER — MIDAZOLAM HCL 2 MG/2ML IJ SOLN
INTRAMUSCULAR | Status: AC
  Filled 2013-08-03: qty 2

## 2013-08-03 MED ORDER — ASPIRIN 81 MG PO CHEW: 81.0000 mg | CHEWABLE_TABLET | Freq: Once | ORAL | Status: DC

## 2013-08-03 MED ORDER — VERAPAMIL HCL 2.5 MG/ML IV SOLN
INTRAVENOUS | Status: AC
  Filled 2013-08-03: qty 2

## 2013-08-03 MED ORDER — HEPARIN SODIUM (PORCINE) 1000 UNIT/ML IJ SOLN
INTRAMUSCULAR | Status: AC
  Filled 2013-08-03: qty 1

## 2013-08-03 MED ORDER — SODIUM CHLORIDE 0.9 % IV SOLN: 1.0000 mL/kg/h | INTRAVENOUS | Status: DC

## 2013-08-03 NOTE — Discharge Instructions (Signed)
Excuse from Work, [employer], or Physical Activity  __________________________________________________ needs to be excused from: _____ Work _____ [employer] _____ Physical activity Beginning now and through the following date: ____________________ _____ He/she may return to work or school but still avoid physical activity from now until: ____________________ _____ He/she may return to full physical activity as of: ____________________ Caregiver's signature: ________________________________________  Date: ______________________________________________________ Document Released: 07/21/2000 Document Revised: 04/19/2011 Document Reviewed: 01/25/2005 ExitCare Patient Information [address]. This information is not intended to replace advice given to you by your health care provider. Make sure you discuss any questions you have with your health care provider.

## 2013-08-03 NOTE — CV Procedure (Signed)
CARDIAC CATHETERIZATION  REPORT  NAME:  Troy Parrish   MRN: [id number] DOB:  10-31-1960   ADMIT DATE: 08/03/2013 Procedure Date: 08/03/2013  INTERVENTIONAL CARDIOLOGIST: Leonie Man, M.D., MS PRIMARY CARE PROVIDER: Letta Median, MD PRIMARY CARDIOLOGIST: Leonie Man, MD, MS  PATIENT:  Troy Parrish is a 53 y.o. male with long-standing CAD & ischemic CM who recently presented for follow-up with Class III-IV exertional dyspnea & angina. Came for R&LHC (via L radial Artery & brachial vein on 6/22 - CO of ~3.25, tandem lesions involving both LAD stents & distal RCA ~70% lesion. The LAD was treated with 2 new DES stents, but during catheter exchange for the JR4 Guide, he developed significant L Brachial Artery spasm. I decided to abort the attempted RCA FFR guided PCI and scheduled him for staged procedure today. He felt somewhat better, but is still scared to exercise with this RCA lesion.  He presents for his staged procedure.  PRE-OPERATIVE DIAGNOSIS:    Multivessel CAD  Ischemic Cardiomyopathy  Class III Exertional Dyspnea / Angina - s/p PCI of LAD  Known intermediate lesion in RCA  PROCEDURES PERFORMED:    Left Heart Catheterization with Native Coronary Angiography  via Right Radial Artery   Fractional Flow Reserve Measurement of distal RCA 60-70% stenosis.  PROCEDURE: The patient was brought to the [facility] in the fasting state and prepped and draped in the usual sterile fashion for Right Radial artery access. A modified Allen's test was performed on the right wrist demonstrating excellent collateral flow for radial access.   Sterile technique was used including antiseptics, cap, gloves, gown, hand hygiene, mask and sheet. Skin prep: Chlorhexidine.   Consent: Risks of procedure as well as the alternatives and risks of each were explained to the (patient/caregiver). Consent for procedure obtained.   Time Out: Verified patient  identification, verified procedure, site/side was marked, verified correct patient position, special equipment/implants available, medications/allergies/relevent history reviewed, required imaging and test results available. Performed.  Access:   Right Radial Artery: 6 Fr Sheath -  modified Seldinger Technique (Angiocath Micropuncture Kit)  Radial Cocktail - 10 mL; IV Heparin 6500 Units   Left Heart Catheterization: 6 Fr JR 4 Guide catheter advanced over a Long [employer] Safety J-wire  Right Coronary Artery Cineangiography: JR4 Guide Catheter   LV Hemodynamics: JR 4 Guide  Sheath removed in the Cath Lab with TR Band Placement for hemostasis.  TR Band: 1115  Hours; 13 mL air  FINDINGS:  Hemodynamics:   Central Aortic Pressure / Mean: 90/68/79 mmHg  Left Ventricular Pressure / LVEDP: 94/8/14mmHg  Coronary Anatomy:   RCA: Large caliber, dominant vessel with a patent proximal stent at as roughly 10% ISR to in the distal vessel prior to the bifurcation there is a eccentric roughly 70% stenosis. The vessel normalizes before giving off a tandem PDA with a larger second vessel being the main PDA that has diffuse 50-60% stenoses. The Right Posterior AV Groove Branch (RPAV) bifurcates into 2 major left posterior lateral branches, the most distal has a 90% stenosis in the ostium and a roughly 1.52 mm vessel. Not likely PCI amenable (potentially angioplasty only)   Fractional Flow Reserve Measurement: Guide: 6 Fr   JR4 Guidewire: [employer] advanced to the distal RCA - Adenosine @ 140 mcg/kg/min x 2 min = 0.89  Wire advanced beyond ostial RPL2 - Adenosine x 90 sec = 0.83  Angiographically and physiologically intermediate, but not significantly flow limiting.  Post FFR angiogrpahy with and without guidewire in place revealed excellent stent deployment and lesion coverage.  There was no evidence of dissection or perforation.  MEDICATIONS:  Anesthesia:  Local Lidocaine 2  ml  Sedation:  1 mg IV Versed, 25 mcg IV fentanyl   Omnipaque Contrast: 30 ml  Anticoagulation:  IV Heparin 6500 Units   Anti-Platelet Agent:  Effient 10 mg  PATIENT DISPOSITION:    The patient was transferred to the PACU holding area in a hemodynamicaly stable, chest pain free condition.  The patient tolerated the procedure well, and there were no complications.  EBL:   < 5 ml  The patient was stable before, during, and after the procedure.  POST-OPERATIVE DIAGNOSIS:    Angiographically and physiologically intermediate, but not significantly flow limiting lesion in the distal RCA.    Minimally elevated LVEDP with borderline systemic pressures  PLAN OF CARE:  Post Radial cath care for intervention  Discharge home today on current medications  Cardiac Rehab consult - ? Redo Phase 2 given ~recent unstable condition.  Will monitor as he increases his activity level over the upcoming months -- if he is not progressing as hoped, would strongly consider PCI of the RCA lesion to complete revascularization.    Leonie Man, M.D., M.S. [facility] [address] [place], [place]  [id number]  [phone]  08/03/2013 11:21 AM

## 2013-08-03 NOTE — H&P (Signed)
History and Physical Interval Note:  NAME:  Troy Parrish   MRN: [id number] DOB:  [birth year]   ADMIT DATE: 08/03/2013   08/03/2013 10:28 AM  Troy Parrish is a 53 y.o. male with long-standing CAD & ischemic CM who recently presented for follow-up with Class III-IV exertional dyspnea & angina.  Came for R&LHC (via L radial Artery & brachial vein on 6/22 - CO of ~3.25, tandem lesions involving both LAD stents & distal RCA ~70% lesion.  The LAD was treated with 2 new DES stents, but during catheter exchange for the JR4 Guide, he developed significant L Brachial Artery spasm.  I decided to abort the attempted RCA FFR guided PCI and scheduled him for staged procedure today.  He felt somewhat better, but is still scared to exercise with this RCA lesion.   He presents for his staged procedure.  Past Medical History  Diagnosis Date  . HTN (hypertension) 07/07/2011  . History of colon polyps   . Hyperlipidemia 07/07/2011  . Recurrent upper respiratory infection (URI)   . CAD S/P percutaneous coronary angioplasty 07/07/2011    Has had PCI to all 3 major vessels; LAD x2 in 2001 (redo PCI in June 2015), circumflex OM 2006, RCA 2013  . ST elevation myocardial infarction (STEMI) of anterior wall 2001    History of -- 2 stents in early and distal mid LAD; prior cardiologist was Dr. Remi Haggard in [place]  . ST elevation myocardial infarction (STEMI) of inferior wall 07/07/2011    Occluded RCA - Integrity Resolute DES 2.75 mm 18 mm (3.1 mm)  . Unstable angina  2006; June 2015    Cx-OM - PCI 2.5 mm 13 mm Cypher DES [name], [place]) ; 07/2013: Severe in-stent restenosis of both proximal and distal LAD stent treated with 2 DES stents (1 at proximal edge of the proximal stent, second stent covers the entire distal stent as well as proximal and distal edge stenoses.  Marland Kitchen COPD (chronic obstructive pulmonary disease)   . Sleep apnea in adult    Past Surgical History  Procedure Laterality Date  . Transthoracic  echocardiogram  07/10/2011    EF 50-55% ,LV graded 1 diastolic dysf.   . Nuclear exercise study  11/30/2011    EF 45% ,exercise 10 METS, infarct\scar w mild perinfarct ischemia -basal inferolat and mid inferolat region  . Met/cpet  11/09/2011    submax. effort 1.05 RER peak V02 was 51% ,chronotropic incomp.hrt rate lows 80s to 100  . Cardiac catheterization  07/07/2011     inferolat post LV infarct ST-elevation MI  . Coronary angioplasty with stent placement  06/2011    occluded RCA ,Integrity Resolute DES 2.75 x 18 mm stent dilated 3.1 mm  . Coronary angioplasty with stent placement  2001    ANTERIOR mi with a  PCI to LAD   . Coronary angioplasty with stent placement  2006    [place] by Dr Dorris Fetch -lesion lft circ/OM1 with 2.5 x 33mm Cypher DES  . Percutaneous coronary stent intervention (pci-s)   June 2015    Distal mid LAD: Xience Alpine DES 2.25 mm x 28 mm (overlapping proximal and distal edge of previous stent) - 2.7 mm; proximal LAD 2.5 mm x 12 mm Xience Alpine DES (2.8 mm);; RCA 60-70% stenosis planned staged procedure  . Transthoracic echocardiogram  08/01/2011    LVEF 45-50. Inferior posterior hypokinesis with mild dilation. Apparent normal diastolic pressures  . Right heart cath  June 2015    Normal  pressures with severely reduced cardiac output and index. (3.25/1.55)    FAMHx: Family History  Problem Relation Age of Onset  . Coronary artery disease Father     SOCHx:  reports that he quit smoking about 6 weeks ago. His smoking use included Cigarettes. He has a 20 pack-year smoking history. He has never used smokeless tobacco. He reports that he drinks alcohol. He reports that he does not use illicit drugs.  ALLERGIES: No Known Allergies  HOME MEDICATIONS: Prescriptions prior to admission  Medication Sig Dispense Refill  . aspirin EC 81 MG tablet Take 162 mg by mouth at bedtime.      Marland Kitchen buPROPion (WELLBUTRIN SR) 150 MG 12 hr tablet Take 150 mg by mouth daily.      .  Fluticasone Furoate-Vilanterol (BREO ELLIPTA) 100-25 MCG/INH AEPB Inhale 1 puff into the lungs daily as needed (for shortness of breath).      . furosemide (LASIX) 20 MG tablet Take 1 tablet (20 mg total) by mouth daily.  30 tablet  5  . losartan (COZAAR) 25 MG tablet Take 1 tablet (25 mg total) by mouth daily.  90 tablet  3  . metoprolol succinate (TOPROL-XL) 25 MG 24 hr tablet Take 12.5 mg by mouth daily.      . niacin (NIASPAN) 500 MG CR tablet Take 500 mg by mouth at bedtime. Takes 30 minutes after taking aspirin.      . prasugrel (EFFIENT) 10 MG TABS tablet Take 1 tablet (10 mg total) by mouth daily.  30 tablet  5  . rosuvastatin (CRESTOR) 20 MG tablet Take 1 tablet (20 mg total) by mouth daily at 6 PM.  30 tablet  6    PHYSICAL EXAM:Blood pressure 121/80, pulse 101, temperature 98 F (36.7 C), temperature source Oral, resp. rate 20, height 6' (1.829 m), weight 189 lb (85.73 kg), SpO2 98.00%. General appearance: alert, cooperative, appears stated age, no distress and He actually has more color than usual today and in somewhat better spirits. Well-nourished and well-groomed  Neck: no adenopathy, no carotid bruit, no JVD and supple, symmetrical, trachea midline  Lungs: diminished breath sounds bilaterally, wheezes bilaterally and Otherwise the most part CPAP. Nonlabored. Increased AP diameter. Prolonged expiratory phase  Heart: RRR with ectopy, S1, S2 normal, no murmur, click, rub or gallop and normal apical impulse  Abdomen: soft, non-tender; bowel sounds normal; no masses, no organomegaly and Mildly protuberant  Extremities: extremities normal, atraumatic, no cyanosis or edema  Pulses: 2+ and symmetric  Neurologic: Mental status: Alert, oriented, thought content appropriate, affect: blunted and mood-congruent  Cranial nerves: normal   IMPRESSION & PLAN Principal Problem:   DOE (dyspnea on exertion) - Class III Active Problems:   Cardiomyopathy, ischemic; EF40-45% with hypokinesis of  the inferior and inferolateral myocardium; CO 3.25   CAD S/P percutaneous coronary angioplasty   HTN (hypertension)   Hyperlipidemia   Smoker   OSA (obstructive sleep apnea)  The patients' history has been reviewed, patient examined, no change in status from most recent note, stable for surgery. I have reviewed the patients' chart and labs. Questions were answered to the patient's satisfaction.    Donivan Thammavong has presented today for surgery, with the diagnosis of Class III-IV Angina/Exertional Dyspnea.  The various methods of treatment have been discussed with the patient and family.   Risks / Complications include, but not limited to: Death, MI, CVA/TIA, VF/VT (with defibrillation), Bradycardia (need for temporary pacer placement), contrast induced nephropathy, bleeding / bruising / hematoma /  pseudoaneurysm, vascular or coronary injury (with possible emergent CT or Vascular Surgery), adverse medication reactions, infection.     After consideration of risks, benefits and other options for treatment, the patient has consented to Procedure(s):  LEFT HEART CATHETERIZATION AND CORONARY ANGIOGRAPHY +/- AD HOC PERCUTANEOUS CORONARY INTERVENTION FRACTIONAL FLOW RESERVE MEASUREMENT OF RCA LESION - with possible FFR guided PCI.  as a surgical intervention.   We will proceed with the planned procedure.   [facility] [address] [place], [place]  [id number]  [phone]  08/03/2013 10:28 AM

## 2013-08-07 ENCOUNTER — Ambulatory Visit (INDEPENDENT_AMBULATORY_CARE_PROVIDER_SITE_OTHER): Payer: BC Managed Care – PPO | Admitting: Cardiology

## 2013-08-07 ENCOUNTER — Ambulatory Visit (HOSPITAL_COMMUNITY)
Admission: RE | Admit: 2013-08-07 | Discharge: 2013-08-07 | Disposition: A | Discharge status: Home / Self Care | Payer: BC Managed Care – PPO | Source: Ambulatory Visit | Attending: Cardiology | Admitting: Cardiology

## 2013-08-07 ENCOUNTER — Telehealth: Payer: Self-pay | Admitting: Cardiology

## 2013-08-07 ENCOUNTER — Encounter: Payer: Self-pay | Admitting: Cardiology

## 2013-08-07 VITALS — BP 110/70 | HR 100 | Ht 72.0 in | Wt 193.4 lb

## 2013-08-07 DIAGNOSIS — I255 Ischemic cardiomyopathy: Secondary | ICD-10-CM

## 2013-08-07 DIAGNOSIS — M79609 Pain in unspecified limb: Secondary | ICD-10-CM

## 2013-08-07 DIAGNOSIS — R0989 Other specified symptoms and signs involving the circulatory and respiratory systems: Secondary | ICD-10-CM

## 2013-08-07 DIAGNOSIS — I82402 Acute embolism and thrombosis of unspecified deep veins of left lower extremity: Secondary | ICD-10-CM

## 2013-08-07 DIAGNOSIS — R0600 Dyspnea, unspecified: Secondary | ICD-10-CM

## 2013-08-07 DIAGNOSIS — I10 Essential (primary) hypertension: Secondary | ICD-10-CM

## 2013-08-07 DIAGNOSIS — Z9861 Coronary angioplasty status: Secondary | ICD-10-CM

## 2013-08-07 DIAGNOSIS — I82409 Acute embolism and thrombosis of unspecified deep veins of unspecified lower extremity: Secondary | ICD-10-CM | POA: Insufficient documentation

## 2013-08-07 DIAGNOSIS — R0609 Other forms of dyspnea: Secondary | ICD-10-CM

## 2013-08-07 DIAGNOSIS — E119 Type 2 diabetes mellitus without complications: Secondary | ICD-10-CM

## 2013-08-07 DIAGNOSIS — I2589 Other forms of chronic ischemic heart disease: Secondary | ICD-10-CM

## 2013-08-07 DIAGNOSIS — M79652 Pain in left thigh: Secondary | ICD-10-CM

## 2013-08-07 DIAGNOSIS — I200 Unstable angina: Secondary | ICD-10-CM

## 2013-08-07 DIAGNOSIS — E118 Type 2 diabetes mellitus with unspecified complications: Secondary | ICD-10-CM | POA: Insufficient documentation

## 2013-08-07 DIAGNOSIS — G4733 Obstructive sleep apnea (adult) (pediatric): Secondary | ICD-10-CM

## 2013-08-07 DIAGNOSIS — I2510 Atherosclerotic heart disease of native coronary artery without angina pectoris: Secondary | ICD-10-CM

## 2013-08-07 MED ORDER — METOPROLOL SUCCINATE ER 25 MG PO TB24: 25.0000 mg | ORAL_TABLET | Freq: Every day | ORAL | Status: DC

## 2013-08-07 MED ORDER — ISOSORBIDE MONONITRATE ER 30 MG PO TB24: 30.0000 mg | ORAL_TABLET | Freq: Every day | ORAL | Status: DC

## 2013-08-07 NOTE — Progress Notes (Signed)
Left Lower Ext. Venous Duplex Completed. Negative for DVT. [name], BS, RDMS, RVT  

## 2013-08-07 NOTE — Assessment & Plan Note (Signed)
Stable

## 2013-08-07 NOTE — Assessment & Plan Note (Signed)
Venous Doppler was negative for DVT

## 2013-08-07 NOTE — Progress Notes (Signed)
08/07/2013   PCP: Letta Median, MD   Chief Complaint  Patient presents with  . Follow-up    post Cath. 6/22 & 6/26 no chest pain, still having dyspnea. having pain in left leg (groin to knee area) has had it for some time    Primary Cardiologist:Dr. Roni Bread   HPI:  53 year old male with past medical history below is here posthospitalization. Patient was admitted 07/30/2013 and discharge 6/23.  Patient was admitted for elective cardiac cath for dyspnea on exertion. The dyspnea is out of proportion to his COPD per pulmonary.  He had severe disease in multiple sites in the LAD and previous placed stents in both proximal and mid LAD also moderate to severe distal RCA disease with severe disease in the small caliber or PL system. Patient underwent 2 site PCI of the LAD with Xience DES stents Prox LAD and distal mid LAD.    Pt returned June 26th for a relook of the RCA and FFR which was 0.83.  Not hemodynamically significant to intervene.  Patient was discharged with recommendations for cardiac rehabilitation. Patient has anxiety about pushing himself with exercise.  Today's here for followup with continued dyspnea on exertion no chest pain. His heart rate is elevated today at 100. Patient denies any bleeding issues.  Additionally his hemoglobin A1c was 6.6 during hospitalization. I informed the patient this and that he would need primary care to manage his diabetes. The patient re: checks his glucose at home and at home it runs around 120. His wife is a diabetic and they will be on the same diet.  She complains of pain in his left thigh with movement and with palpitation. No erythema no swelling.    No Known Allergies  Current Outpatient Prescriptions  Medication Sig Dispense Refill  . aspirin EC 81 MG tablet Take 162 mg by mouth at bedtime.      Marland Kitchen buPROPion (WELLBUTRIN SR) 150 MG 12 hr tablet Take 150 mg by mouth daily.      . Fluticasone Furoate-Vilanterol  (BREO ELLIPTA) 100-25 MCG/INH AEPB Inhale 1 puff into the lungs daily as needed (for shortness of breath).      . furosemide (LASIX) 20 MG tablet Take 1 tablet (20 mg total) by mouth daily.  30 tablet  5  . isosorbide mononitrate (IMDUR) 30 MG 24 hr tablet Take 1 tablet (30 mg total) by mouth daily.  30 tablet  6  . losartan (COZAAR) 25 MG tablet Take 1 tablet (25 mg total) by mouth daily.  90 tablet  3  . metoprolol succinate (TOPROL-XL) 25 MG 24 hr tablet Take 25 mg by mouth daily.       . niacin (NIASPAN) 500 MG CR tablet Take 500 mg by mouth at bedtime. Takes 30 minutes after taking aspirin.      . prasugrel (EFFIENT) 10 MG TABS tablet Take 1 tablet (10 mg total) by mouth daily.  30 tablet  5  . rosuvastatin (CRESTOR) 20 MG tablet Take 1 tablet (20 mg total) by mouth daily at 6 PM.  30 tablet  6   No current facility-administered medications for this visit.    Past Medical History  Diagnosis Date  . HTN (hypertension) 07/07/2011  . History of colon polyps   . Hyperlipidemia 07/07/2011  . Recurrent upper respiratory infection (URI)   . CAD S/P percutaneous coronary angioplasty 07/07/2011    Has had PCI to all 3 major vessels;  LAD x2 in 2001 (redo PCI in June 2015), circumflex OM 2006, RCA 2013  . ST elevation myocardial infarction (STEMI) of anterior wall 2001    History of -- 2 stents in early and distal mid LAD; prior cardiologist was Dr. Remi Haggard in [place]  . ST elevation myocardial infarction (STEMI) of inferior wall 07/07/2011    Occluded RCA 2.75X18 INTEGRITY  . Unstable angina  2006; June 2015    Cx-OM - PCI 2.5 mm 13 mm Cypher DES [name], [place]) ; 07/2013: Severe in-stent restenosis of both proximal and distal LAD stent treated with 2 DES stents (1 at proximal edge of the proximal stent, second stent covers the entire distal stent as well as proximal and distal edge stenoses.  Marland Kitchen COPD (chronic obstructive pulmonary disease)   . Sleep apnea in adult     Past Surgical  History  Procedure Laterality Date  . Transthoracic echocardiogram  07/10/2011    EF 50-55% ,LV graded 1 diastolic dysf.   . Nuclear exercise study  11/30/2011    EF 45% ,exercise 10 METS, infarct\scar w mild perinfarct ischemia -basal inferolat and mid inferolat region  . Met/cpet  11/09/2011    submax. effort 1.05 RER peak V02 was 51% ,chronotropic incomp.hrt rate lows 80s to 100  . Cardiac catheterization  07/07/2011     inferolat post LV infarct ST-elevation MI  . Coronary angioplasty with stent placement  06/2011    occluded RCA ,Integrity Resolute DES 2.75 x 18 mm stent dilated 3.1 mm  . Coronary angioplasty with stent placement  2001    ANTERIOR mi with a  PCI to LAD   . Coronary angioplasty with stent placement  2006    [place] by Dr Dorris Fetch -lesion lft circ/OM1 with 2.5 x 86m Cypher DES  . Percutaneous coronary stent intervention (pci-s)   June 2015    Distal mid LAD: Xience Alpine DES 2.25 mm x 28 mm (overlapping proximal and distal edge of previous stent) - 2.7 mm; proximal LAD 2.5 mm x 12 mm Xience Alpine DES (2.8 mm);; RCA 60-70% stenosis planned staged procedure  . Transthoracic echocardiogram  08/01/2011    LVEF 45-50. Inferior posterior hypokinesis with mild dilation. Apparent normal diastolic pressures  . Right heart cath  June 2015    Normal pressures with severely reduced cardiac output and index. (3.25/1.55)    RHDQ:QIWLNLG:XQcolds or fevers, no weight changes Skin:no rashes or ulcers HEENT:no blurred vision, no congestion CV:see HPI PUL:see HPI GI:no diarrhea constipation or melena, no indigestion GU:no hematuria, no dysuria MS:no joint pain, no claudication Neuro:no syncope, no lightheadedness Endo:+ new diabetes, no thyroid disease  Wt Readings from Last 3 Encounters:  08/07/13 193 lb 6.4 oz (87.726 kg)  08/03/13 189 lb (85.73 kg)  08/03/13 189 lb (85.73 kg)    PHYSICAL EXAM BP 110/70  Pulse 100  Ht 6' (1.829 m)  Wt 193 lb 6.4 oz (87.726 kg)   BMI 26.22 kg/m2 General:Pleasant affect, NAD Skin:Warm and dry, brisk capillary refill HEENT:normocephalic, sclera clear, mucus membranes moist Neck:supple, no JVD, no bruits  Heart:S1S2 RRR without murmur, gallup, rub or click Lungs:clear without rales, rhonchi, or wheezes AJJH:ERDE non tender, + BS, do not palpate liver spleen or masses Ext:no lower ext edema, 2+ pedal pulses, 2+ radial pulses, Lt thigh with tenderness to palpation Neuro:alert and oriented, MAE, follows commands, + facial symmetry EKG:SR to ST no acute changes.    ASSESSMENT AND PLAN DOE (dyspnea on exertion) - Class III Continues with  dyspnea on exertion despite the stents to the LAD. RCA was not significantly hemodynamically compromised for stent. His heart rate is elevated today I am increasing the Toprol to 25 mg daily and I am adding Imdur 30 mg daily. He'll follow with Dr. Ellyn Hack in the [place] office.  He will need to be set with react rehabilitation but since he is still symptomatic and then to wait until he sees Dr. Ellyn Hack back.  Cardiomyopathy, ischemic; EF40-45% with hypokinesis of the inferior and inferolateral myocardium; CO 3.25 EF is improved 45-50%  CAD S/P percutaneous coronary angioplasty Has had PCI to all 3 major vessels;  his former primary cardiologist in [place] was Dr. Remi Haggard.   Anterior MI with a PCI to the LAD in 2001.   Unstable angina in 2006,-- left circumflex/OM1 PCI: 2.5-x-13-mm Cypher DES by Dr. Dorris Fetch in [place], [place].   Inferolateral posterior STEMI 2013:  Occluded RCA treated -> Integrity Resolute DES 2.75-x-18-mm stent postdilated to 3.1 mm.   Now with new stents in the proximal LAD and distal mid LAD 07/30/13.    Continues with dyspnea on exertion EKG stable  HTN (hypertension) Blood pressure stable  OSA (obstructive sleep apnea) Stable  Left thigh pain Venous Doppler was negative for DVT  DM2 (diabetes mellitus, type 2) Patient is aware he he now  has diabetes primary care we'll follow. In the meantime he understands diabetic diet as his wife is diabetic.

## 2013-08-07 NOTE — Assessment & Plan Note (Signed)
Continues with dyspnea on exertion despite the stents to the LAD. RCA was not significantly hemodynamically compromised for stent. His heart rate is elevated today I am increasing the Toprol to 25 mg daily and I am adding Imdur 30 mg daily. He'll follow with Dr. Ellyn Hack in the [place] office.  He will need to be set with react rehabilitation but since he is still symptomatic and then to wait until he sees Dr. Ellyn Hack back.

## 2013-08-07 NOTE — Assessment & Plan Note (Signed)
Patient is aware he he now has diabetes primary care we'll follow. In the meantime he understands diabetic diet as his wife is diabetic.

## 2013-08-07 NOTE — Assessment & Plan Note (Addendum)
Has had PCI to all 3 major vessels;  his former primary cardiologist in [place] was Dr. Remi Haggard.   Anterior MI with a PCI to the LAD in 2001.   Unstable angina in 2006,-- left circumflex/OM1 PCI: 2.5-x-13-mm Cypher DES by Dr. Dorris Fetch in [place], [place].   Inferolateral posterior STEMI 2013:  Occluded RCA treated -> Integrity Resolute DES 2.75-x-18-mm stent postdilated to 3.1 mm.   Now with new stents in the proximal LAD and distal mid LAD 07/30/13.    Continues with dyspnea on exertion EKG stable

## 2013-08-07 NOTE — Assessment & Plan Note (Signed)
Blood pressure stable ? ?

## 2013-08-07 NOTE — Patient Instructions (Addendum)
1.Your physician recommends that you schedule a follow-up appointment in: July 15th with Dr. Ellyn Hack at the [facility]  2. Take Imdur 30 mg one daily  3. Increase your Toprol XL to 25 mg daily  4. You will need to get a PCP as your regular medical doctor either from a [facility]  provider , and you may want to try Dr. Silvestre Mesi office

## 2013-08-07 NOTE — Assessment & Plan Note (Signed)
EF is improved 45-50%

## 2013-08-08 NOTE — Telephone Encounter (Signed)
Closed encounter °

## 2013-08-29 ENCOUNTER — Encounter: Payer: Self-pay | Admitting: Cardiology

## 2013-08-29 ENCOUNTER — Ambulatory Visit (INDEPENDENT_AMBULATORY_CARE_PROVIDER_SITE_OTHER): Payer: BC Managed Care – PPO | Admitting: Cardiology

## 2013-08-29 VITALS — BP 132/98 | HR 110 | Ht 72.0 in | Wt 192.1 lb

## 2013-08-29 DIAGNOSIS — I25119 Atherosclerotic heart disease of native coronary artery with unspecified angina pectoris: Secondary | ICD-10-CM

## 2013-08-29 DIAGNOSIS — R0989 Other specified symptoms and signs involving the circulatory and respiratory systems: Secondary | ICD-10-CM

## 2013-08-29 DIAGNOSIS — I209 Angina pectoris, unspecified: Secondary | ICD-10-CM

## 2013-08-29 DIAGNOSIS — I5189 Other ill-defined heart diseases: Secondary | ICD-10-CM

## 2013-08-29 DIAGNOSIS — I2510 Atherosclerotic heart disease of native coronary artery without angina pectoris: Secondary | ICD-10-CM

## 2013-08-29 DIAGNOSIS — I200 Unstable angina: Secondary | ICD-10-CM

## 2013-08-29 DIAGNOSIS — R0600 Dyspnea, unspecified: Secondary | ICD-10-CM

## 2013-08-29 DIAGNOSIS — R7309 Other abnormal glucose: Secondary | ICD-10-CM

## 2013-08-29 DIAGNOSIS — I10 Essential (primary) hypertension: Secondary | ICD-10-CM

## 2013-08-29 DIAGNOSIS — E8881 Metabolic syndrome: Secondary | ICD-10-CM

## 2013-08-29 DIAGNOSIS — R7303 Prediabetes: Secondary | ICD-10-CM

## 2013-08-29 DIAGNOSIS — E118 Type 2 diabetes mellitus with unspecified complications: Secondary | ICD-10-CM

## 2013-08-29 DIAGNOSIS — Z9861 Coronary angioplasty status: Secondary | ICD-10-CM

## 2013-08-29 DIAGNOSIS — Z87891 Personal history of nicotine dependence: Secondary | ICD-10-CM

## 2013-08-29 DIAGNOSIS — I255 Ischemic cardiomyopathy: Secondary | ICD-10-CM

## 2013-08-29 DIAGNOSIS — E785 Hyperlipidemia, unspecified: Secondary | ICD-10-CM

## 2013-08-29 DIAGNOSIS — R0609 Other forms of dyspnea: Secondary | ICD-10-CM

## 2013-08-29 DIAGNOSIS — I2589 Other forms of chronic ischemic heart disease: Secondary | ICD-10-CM

## 2013-08-29 MED ORDER — METOPROLOL SUCCINATE ER 50 MG PO TB24: 50.0000 mg | ORAL_TABLET | Freq: Every day | ORAL | Status: DC

## 2013-08-29 NOTE — Patient Instructions (Addendum)
Your physician has recommended you make the following change in your medication:  Increase Metoprolol to 1.5 tablets (37.5 mg) daily until you are out of the 25 mg tablets. Then increase to 50 mg once daily (new rx for 50 mg tablets sent to pharmacy)   Take Imdur 1 hour after your Aspirin. You can take Tylenol for the headache caused by Imdur.  Take 1 additional Lasix tablet if you gain 3 lbs overnight. You are currently 2 lbs over your goal. Please take one additional tablet today.    You have been referred to Cardiac Rehab at [facility]. If you do not hear from them within 7 business days please call me Elmyra Ricks at [phone]  Your physician recommends that you schedule a follow-up appointment in: 2 months with Dr. Ellyn Hack

## 2013-08-29 NOTE — Progress Notes (Signed)
PCP: Letta Median, MD  Clinic Note: Chief Complaint  Patient presents with  . Shortness of Breath    Persistent Exertional Dyspnea & decreased Exercise tolerance    HPI: Troy Parrish is a 53 y.o. male with a Cardiovascular Problem List below who presents today for his second post Followup after PCI to the LAD in 2 different sites. He also had FFR done of the RCA which was not physiologically significant. Despite the fact is EF was estimated at 40 and 45%, his cardiac output on right heart catheterization was notably reduced. He saw Perrin Smack, NP-C. on June 30 - Toprol dose was increased to 25 mg and Imdur was added. He now follows up in the [place] office the  Interval History: He returns today he, stating that the angina symptoms and the severe exertional dyspnea with associated angina has definitely improved. He still though is very limited from an exercise standpoint he's very short of breath and tired when doing just that any activity. He went fishing this past week and had to stop at least twice when going from his vehicle to the fishing spot which is not that far. He got quite short of breath with it. He is noted to be quite tachycardic again today. He denies any sensation of rapid or irregular heartbeats, syncope or near-syncope but does feel lightheaded sometimes when he goes to stand up. No TIA, amaurosis fugax symptoms. No melena, hematochezia or hematuria. No epistaxis. He has mild aches and pains in his legs when he walks but it does not sound like claudication.  Past Medical History  Diagnosis Date  . Essential hypertension 07/07/2011  . History of colon polyps   . Hyperlipidemia with target LDL less than 70 07/07/2011  . Recurrent upper respiratory infection (URI)   . CAD S/P percutaneous coronary angioplasty 07/07/2011    Has had PCI to all 3 major vessels; LAD x2 in 2001 (redo PCI in June 2015), circumflex OM 2006, RCA 2013  . ST elevation myocardial infarction  (STEMI) of anterior wall 2001    History of -- 2 stents in early and distal mid LAD; prior cardiologist was Dr. Remi Haggard in [place]  . ST elevation myocardial infarction (STEMI) of inferior wall 07/07/2011    Occluded RCA 2.75X18 INTEGRITY  . Unstable angina  2006; June 2015    Cx-OM - PCI 2.5 mm 13 mm Cypher DES [name], [place]) ; 07/2013: Severe in-stent restenosis of both proximal and distal LAD stent treated with 2 DES stents (1 at proximal edge of the proximal stent, second stent covers the entire distal stent as well as proximal and distal edge stenoses, moderate disease in distal RCA with ostial small PL branch disease as well but FFR was 0.83 - medical management   . COPD (chronic obstructive pulmonary disease)   . Sleep apnea in adult     On CPAP  . Former heavy tobacco smoker      quit in May 2015 after multiple attempts at trying to quit before   . Glucose intolerance (pre-diabetes) June 2015     hemoglobin A1c 6.6  . Metabolic syndrome     Pre-diabetes, hypertension and truncal obesity as well as dyslipidemia    Prior Cardiac Evaluation and Past Surgical History: Past Surgical History  Procedure Laterality Date  . Transthoracic echocardiogram  07/10/2011    EF 50-55% ,LV graded 1 diastolic dysf.   . Nuclear exercise study  11/30/2011    EF 45% ,exercise 10 METS,  infarct\scar w mild perinfarct ischemia -basal inferolat and mid inferolat region  . Met/cpet  11/09/2011    submax. effort 1.05 RER peak V02 was 51% ,chronotropic incomp.hrt rate lows 80s to 100  . Cardiac catheterization  07/07/2011     inferolat post LV infarct ST-elevation MI  . Coronary angioplasty with stent placement  06/2011    occluded RCA ,Integrity Resolute DES 2.75 x 18 mm stent dilated 3.1 mm  . Coronary angioplasty with stent placement  2001    ANTERIOR mi with a  PCI to LAD   . Coronary angioplasty with stent placement  2006    [place] by Dr Dorris Fetch -lesion lft circ/OM1 with 2.5 x 2mm  Cypher DES  . Percutaneous coronary stent intervention (pci-s)   June 2015    Distal mid LAD: Xience Alpine DES 2.25 mm x 28 mm (overlapping proximal and distal edge of previous stent) - 2.7 mm; proximal LAD 2.5 mm x 12 mm Xience Alpine DES (2.8 mm);; RCA 60-70% stenosis planned staged procedure  . Transthoracic echocardiogram  08/01/2011    LVEF 45-50. Inferior posterior hypokinesis with mild dilation. Apparent normal diastolic pressures  . Right heart cath  June 2015    Normal pressures with severely reduced cardiac output and index. (3.25/1.55)    MEDICATIONS AND ALLERGIES REVIEWED IN EPIC No Change in Social and Family History  ROS: A comprehensive Review of Systems - Negative except Symptoms noted in history of present illness.  Wt Readings from Last 3 Encounters:  08/29/13 192 lb 1.9 oz (87.145 kg)  08/07/13 193 lb 6.4 oz (87.726 kg)  08/03/13 189 lb (85.73 kg)   PHYSICAL EXAM BP 132/98  Pulse 110  Ht 6' (1.829 m)  Wt 192 lb 1.9 oz (87.145 kg)  BMI 26.05 kg/m2 General appearance: alert, cooperative, appears stated age, no distress but looks more depressed than usual to.  He actually has more color than usual today and in somewhat better spirits. Well-nourished and well-groomed  Neck: no adenopathy, no carotid bruit, no JVD and supple, symmetrical, trachea midline  Lungs: diminished breath sounds bilaterally, wheezes bilaterally and Otherwise the most part CPAP. Nonlabored. Increased AP diameter. Prolonged expiratory phase  Heart: Tachycardic with RR with ectopy, S1, S2 normal, no murmur, click, rub or gallop and normal apical impulse  Abdomen: soft, non-tender; bowel sounds normal; no masses, no organomegaly and Mildly protuberant  Extremities: extremities normal, atraumatic, no cyanosis or edema  Pulses: 2+ and symmetric  Neurologic: Mental status: Alert, oriented, thought content appropriate, affect: blunted and mood-congruent  Cranial nerves: normal   Adult ECG Report   Rate: 110 ;  Rhythm: sinus tachycardia; nonspecific ST and T wave changes with possible left atrial enlargement. No significant change  Recent Labs 08/03/2013:   Sodium 135, potassium 5.0, chloride 103, bicarbonate 20, BUN/creatinine 16/0.78,glucose 134,  CBC: W. 13.1, Hgb 15.9, Plt 358  Hemoglobin A1c 66  ASSESSMENT / PLAN: DOE (dyspnea on exertion) - Class III I'm actually not happy at the fact that he continues to be quite symptomatic. He is not having a dramatic symptoms that he was having prior to his PCI but he is still more symptomatic then I hoped. He is quite tachycardic which may be compensatory for his poor cardiac output noted on right heart cath. I was hoping that following his PCI this would improve. I do not think that the RCA is responsible as much as is the diastolic dysfunction with microvascular disease in conjunction with his COPD.  Plan:  He has not been taking the Imdur because of headache. I recommend that he takes it 30 minutes after his aspirin and take it with Tylenol.  Increase Toprol dose to 50  We discussed sliding scale Lasix as his weight has increased -   Cardiac rehabilitation referral based on PCI following unstable angina  Cardiomyopathy, ischemic; EF40-45% with hypokinesis of the inferior and inferolateral myocardium; CO 3.25 Despite the fact the echo from June was read as normal diastolic function with an EF of 45-50%, I still think the diastolic function is clearly not normal.    Plan: Increase Imdur and Toprol, sliding scale Lasix: Take 1 additional Lasix tablet if you gain 3 lbs overnight. You are currently 2 lbs over your goal. Please take one additional tablet today.     CAD S/P percutaneous coronary angioplasty I was surprised to see just how much aggression of disease there was in the LAD, I would hope that after extensive PCI that his symptoms would improve somewhat. He does have residual RCA disease that was not hemodynamically significant  on FFR, but if his symptoms cannot be controlled by the time of the next visit I think I would be inclined to go ahead and perform PCI of the RCA.  Continue dual therapy indefinitely.  Chronotropic incompetence with left ventricular dysfunction --> improved with reduction of beta blocker Very interesting that he had chronotropic incompetence previously but is now tachycardic while off beta blocker. He does need to be on a beta blocker and a preferred to use Toprol or potentially bisoprolol as beta 1 selective agents given his COPD. His heart rate of 110 beats a minute on initial evaluation EKG was a little bit lower I saw him short shortly thereafter, however I think the rapid resting heart rate when he exerts himself, he has diastolic dysfunction and does not tolerate the increased heart rate.  Plan as above , and monitor for signs of bradycardia  Essential hypertension He actually has elevated diastolic pressures. Hopefully by increasing the beta blocker we can get some improvement. We will also be adding the Imdur. He is on losartan which we can also increase if needed. Also increasing his diuretic regimen will help  Unstable angina It would appear that his unstable angina (i.e. chest pain) symptoms have resolved following PCI.  Glucose intolerance (pre-diabetes) He previously been given a diagnosis of diabetes, but it is borderline now he was on the medications. He needs to try to followup with PCP we can try to get one in either [place] or [place]. Would consider possibly adding metformin if he is not seen by PCP when I see him back in roughly 2 months.  Metabolic syndrome We'll need to consider adding metformin along with hopefully getting him back on a statin. Cardiac rehabilitation referral for gradually increasing his exercise level.  Former heavy tobacco smoker I did congratulate him on his smoking cessation.  Hyperlipidemia with target LDL less than 70 had been on Crestor  but not currently on I thought he had been on Crestor, it is currently not listed. Will be restarted if not taking at his followup visit.   Increase Toprol 50 mg SS LASIX Take Imdur - after aspirin and with Tylenol [facility] referral.  Orders Placed This Encounter  Procedures  . AMB referral to cardiac rehabilitation    Referral Priority:  Routine    Referral Type:  Consultation    Number of Visits Requested:  1  . EKG 12-Lead   Meds ordered  this encounter  Medications  . metoprolol succinate (TOPROL-XL) 50 MG 24 hr tablet    Sig: Take 1 tablet (50 mg total) by mouth daily. Take with or immediately following a meal.    Dispense:  90 tablet    Refill:  3   Well over 45 minutes spent discussing the patient's symptoms and treatment plan.  Followup: 2-3 months  Ashly Goethe W. Ellyn Hack, M.D., M.S. Interventional Cardiologist [facility]

## 2013-09-02 ENCOUNTER — Encounter: Payer: Self-pay | Admitting: Cardiology

## 2013-09-02 DIAGNOSIS — Z87891 Personal history of nicotine dependence: Secondary | ICD-10-CM | POA: Insufficient documentation

## 2013-09-02 DIAGNOSIS — E8881 Metabolic syndrome: Secondary | ICD-10-CM | POA: Insufficient documentation

## 2013-09-02 NOTE — Assessment & Plan Note (Signed)
I was surprised to see just how much aggression of disease there was in the LAD, I would hope that after extensive PCI that his symptoms would improve somewhat. He does have residual RCA disease that was not hemodynamically significant on FFR, but if his symptoms cannot be controlled by the time of the next visit I think I would be inclined to go ahead and perform PCI of the RCA.  Continue dual therapy indefinitely.

## 2013-09-02 NOTE — Assessment & Plan Note (Signed)
He previously been given a diagnosis of diabetes, but it is borderline now he was on the medications. He needs to try to followup with PCP we can try to get one in either [place] or [place]. Would consider possibly adding metformin if he is not seen by PCP when I see him back in roughly 2 months.

## 2013-09-02 NOTE — Assessment & Plan Note (Signed)
Despite the fact the echo from June was read as normal diastolic function with an EF of 45-50%, I still think the diastolic function is clearly not normal.    Plan: Increase Imdur and Toprol, sliding scale Lasix: Take 1 additional Lasix tablet if you gain 3 lbs overnight. You are currently 2 lbs over your goal. Please take one additional tablet today.

## 2013-09-02 NOTE — Assessment & Plan Note (Addendum)
I'm actually not happy at the fact that he continues to be quite symptomatic. He is not having a dramatic symptoms that he was having prior to his PCI but he is still more symptomatic then I hoped. He is quite tachycardic which may be compensatory for his poor cardiac output noted on right heart cath. I was hoping that following his PCI this would improve. I do not think that the RCA is responsible as much as is the diastolic dysfunction with microvascular disease in conjunction with his COPD.  Plan:   He has not been taking the Imdur because of headache. I recommend that he takes it 30 minutes after his aspirin and take it with Tylenol.  Increase Toprol dose to 50  We discussed sliding scale Lasix as his weight has increased -   Cardiac rehabilitation referral based on PCI following unstable angina

## 2013-09-02 NOTE — Assessment & Plan Note (Signed)
I thought he had been on Crestor, it is currently not listed. Will be restarted if not taking at his followup visit.

## 2013-09-02 NOTE — Assessment & Plan Note (Addendum)
Very interesting that he had chronotropic incompetence previously but is now tachycardic while off beta blocker. He does need to be on a beta blocker and a preferred to use Toprol or potentially bisoprolol as beta 1 selective agents given his COPD. His heart rate of 110 beats a minute on initial evaluation EKG was a little bit lower I saw him short shortly thereafter, however I think the rapid resting heart rate when he exerts himself, he has diastolic dysfunction and does not tolerate the increased heart rate.  Plan as above , and monitor for signs of bradycardia

## 2013-09-02 NOTE — Assessment & Plan Note (Signed)
I did congratulate him on his smoking cessation.

## 2013-09-02 NOTE — Assessment & Plan Note (Signed)
It would appear that his unstable angina (i.e. chest pain) symptoms have resolved following PCI.

## 2013-09-02 NOTE — Assessment & Plan Note (Signed)
He actually has elevated diastolic pressures. Hopefully by increasing the beta blocker we can get some improvement. We will also be adding the Imdur. He is on losartan which we can also increase if needed. Also increasing his diuretic regimen will help

## 2013-09-02 NOTE — Assessment & Plan Note (Signed)
We'll need to consider adding metformin along with hopefully getting him back on a statin. Cardiac rehabilitation referral for gradually increasing his exercise level.

## 2013-10-24 ENCOUNTER — Emergency Department: Payer: Self-pay | Admitting: Emergency Medicine

## 2013-10-24 ENCOUNTER — Ambulatory Visit: Payer: BC Managed Care – PPO | Admitting: Cardiology

## 2013-10-24 LAB — BASIC METABOLIC PANEL
Anion Gap: 8 (ref 7–16)
BUN: 8 mg/dL (ref 7–18)
Calcium, Total: 8.2 mg/dL — ABNORMAL LOW (ref 8.5–10.1)
Chloride: 107 mmol/L (ref 98–107)
Co2: 24 mmol/L (ref 21–32)
Creatinine: 0.95 mg/dL (ref 0.60–1.30)
EGFR (African American): >60
EGFR (Non-African Amer.): >60
Glucose: 108 mg/dL — ABNORMAL HIGH (ref 65–99)
Osmolality: 276 (ref 275–301)
Potassium: 3.8 mmol/L (ref 3.5–5.1)
Sodium: 139 mmol/L (ref 136–145)

## 2013-10-24 LAB — CBC
HCT: 49.4 % (ref 40.0–52.0)
HGB: 15.9 g/dL (ref 13.0–18.0)
MCH: 28.5 pg (ref 26.0–34.0)
MCHC: 32.2 g/dL (ref 32.0–36.0)
MCV: 89 fL (ref 80–100)
Platelet: 388 10*3/uL (ref 150–440)
RBC: 5.58 10*6/uL (ref 4.40–5.90)
RDW: 14.5 % (ref 11.5–14.5)
WBC: 22.2 10*3/uL — ABNORMAL HIGH (ref 3.8–10.6)

## 2013-10-24 LAB — TROPONIN I
Troponin-I: <0.02 ng/mL
Troponin-I: <0.02 ng/mL

## 2013-10-26 ENCOUNTER — Telehealth: Payer: Self-pay | Admitting: *Deleted

## 2013-10-26 NOTE — Telephone Encounter (Signed)
Heart track orders and records faxed

## 2013-11-07 ENCOUNTER — Encounter: Payer: Self-pay | Admitting: Cardiology

## 2013-11-07 ENCOUNTER — Ambulatory Visit (INDEPENDENT_AMBULATORY_CARE_PROVIDER_SITE_OTHER): Payer: BC Managed Care – PPO | Admitting: Cardiology

## 2013-11-07 ENCOUNTER — Encounter: Payer: Self-pay | Admitting: *Deleted

## 2013-11-07 VITALS — BP 112/78 | HR 99 | Ht 72.0 in | Wt 190.2 lb

## 2013-11-07 DIAGNOSIS — I213 ST elevation (STEMI) myocardial infarction of unspecified site: Secondary | ICD-10-CM

## 2013-11-07 DIAGNOSIS — I209 Angina pectoris, unspecified: Secondary | ICD-10-CM

## 2013-11-07 DIAGNOSIS — R0600 Dyspnea, unspecified: Secondary | ICD-10-CM

## 2013-11-07 DIAGNOSIS — E785 Hyperlipidemia, unspecified: Secondary | ICD-10-CM

## 2013-11-07 DIAGNOSIS — I2510 Atherosclerotic heart disease of native coronary artery without angina pectoris: Secondary | ICD-10-CM

## 2013-11-07 DIAGNOSIS — R5383 Other fatigue: Secondary | ICD-10-CM

## 2013-11-07 DIAGNOSIS — R0609 Other forms of dyspnea: Secondary | ICD-10-CM

## 2013-11-07 DIAGNOSIS — J42 Unspecified chronic bronchitis: Secondary | ICD-10-CM

## 2013-11-07 DIAGNOSIS — I255 Ischemic cardiomyopathy: Secondary | ICD-10-CM

## 2013-11-07 DIAGNOSIS — I219 Acute myocardial infarction, unspecified: Secondary | ICD-10-CM

## 2013-11-07 DIAGNOSIS — I208 Other forms of angina pectoris: Secondary | ICD-10-CM | POA: Insufficient documentation

## 2013-11-07 DIAGNOSIS — Z9861 Coronary angioplasty status: Secondary | ICD-10-CM

## 2013-11-07 DIAGNOSIS — I509 Heart failure, unspecified: Secondary | ICD-10-CM

## 2013-11-07 DIAGNOSIS — R7309 Other abnormal glucose: Secondary | ICD-10-CM

## 2013-11-07 DIAGNOSIS — I5042 Chronic combined systolic (congestive) and diastolic (congestive) heart failure: Secondary | ICD-10-CM

## 2013-11-07 DIAGNOSIS — I5032 Chronic diastolic (congestive) heart failure: Secondary | ICD-10-CM | POA: Insufficient documentation

## 2013-11-07 DIAGNOSIS — R0989 Other specified symptoms and signs involving the circulatory and respiratory systems: Secondary | ICD-10-CM

## 2013-11-07 DIAGNOSIS — I2589 Other forms of chronic ischemic heart disease: Secondary | ICD-10-CM

## 2013-11-07 DIAGNOSIS — I10 Essential (primary) hypertension: Secondary | ICD-10-CM

## 2013-11-07 DIAGNOSIS — R7303 Prediabetes: Secondary | ICD-10-CM

## 2013-11-07 DIAGNOSIS — R5381 Other malaise: Secondary | ICD-10-CM

## 2013-11-07 DIAGNOSIS — R079 Chest pain, unspecified: Secondary | ICD-10-CM

## 2013-11-07 MED ORDER — METOPROLOL SUCCINATE ER 100 MG PO TB24: 100.0000 mg | ORAL_TABLET | Freq: Every day | ORAL | Status: DC

## 2013-11-07 MED ORDER — RANOLAZINE ER 500 MG PO TB12: 500.0000 mg | ORAL_TABLET | Freq: Two times a day (BID) | ORAL | Status: DC

## 2013-11-07 MED ORDER — NITROGLYCERIN 0.4 MG SL SUBL: 0.4000 mg | SUBLINGUAL_TABLET | SUBLINGUAL | Status: DC | PRN

## 2013-11-07 NOTE — Assessment & Plan Note (Signed)
Currently back on Crestor. Labs were great by his last check

## 2013-11-07 NOTE — Assessment & Plan Note (Signed)
I am partly concerned with his recurrence of angina this close to the PCI of his LAD, and would like to ensure that the LAD is still patent. Otherwise I think completing revascularization PCI of the RCA in a patient of this symptoms is warranted. He is on aspirin plus Effient, beta blocker and statin. I will provide supplemental sublingual nitroglycerin. I will also start Ranexa with samples. If effective we will need to figure out a way to help him financially obtain Ranexa.

## 2013-11-07 NOTE — Assessment & Plan Note (Signed)
Remarkably his EF is relatively preserved. I completely do not believe the reading of normal diastolic function however. It's more likely that he has a higher grade diastolic dysfunction. He continues to be on beta blocker and they are as well as standing dose of Lasix with sliding scale.

## 2013-11-07 NOTE — Assessment & Plan Note (Signed)
Again multifactorial. He is taking low dose of antidepressant. I am quite certain that there is some component of depression involved with his symptoms. I just hope will be to get him to do anything more than Wellbutrin for now.  Hopefully complete revascularization will provide some benefit.

## 2013-11-07 NOTE — Assessment & Plan Note (Signed)
As noted in my previous clinic visits. I think some of his age we'll put one is his exertional dyspnea with poor exercise tolerance.  He does feel better with the higher dose of Toprol and was able to tolerate the total 100 mg. On head and increase to 100 mg, initially with taking 50 mg twice a day. He will then go to the new prescription of 100 mg daily.   In the past there was concern of chronotropic incompetence. Now that doesn't seem to be the issue is his heart rate is getting up in the 110s.  Pulmonary medicine is essentially downplaying the effect of his pulmonary disease, stating that it is not significant enough to cause these symptoms.  Plan as above for class III angina

## 2013-11-07 NOTE — Progress Notes (Signed)
PCP: Letta Median, MD  Clinic Note: Chief Complaint  Patient presents with  . OTHER    2 month f/u c/o chest pain/tightness, rapid heart beat, sob, Elevated BP and dizziness. Meds reviewed verbally with pt.    HPI: Troy Parrish is a 53 y.o. male with a PMH below who presents today for 2 month followup of his symptomatic coronary artery disease and diastolic heart failure. I last saw him in July following his extensive LAD PCI as well as FFR of the RCA. He continued to have exertional dyspnea and fatigue with poor exercise tolerance, but is significant anginal symptoms had improved. My thought at that time were to continue medical therapy increase his beta blocker and we're trying to get him set up for cardiac rehabilitation. My plan at that time was to have a low threshold to proceed with PCI of the RCA if his symptoms have not improved. Justification for this would be progressive symptoms despite the FFR results simply because of the rapid progression of disease in the LAD and are he, this could easily progress to being a significant lesion. Complete revascularization would help clarify any other potential etiology.  Past Medical History  Diagnosis Date  . Essential hypertension 07/07/2011  . History of colon polyps   . Hyperlipidemia with target LDL less than 70 07/07/2011  . Recurrent upper respiratory infection (URI)   . CAD S/P percutaneous coronary angioplasty 07/07/2011    Has had PCI to all 3 major vessels; LAD x2 in 2001 (redo PCI in June 2015), circumflex OM 2006, RCA 2013  . ST elevation myocardial infarction (STEMI) of anterior wall 2001    History of -- 2 stents in early and distal mid LAD; prior cardiologist was Dr. Remi Haggard in [place]  . ST elevation myocardial infarction (STEMI) of inferior wall 07/07/2011    Occluded RCA 2.75X18 INTEGRITY  . Unstable angina  2006; June 2015    Cx-OM - PCI 2.5 mm 13 mm Cypher DES [name], [place]) ; 07/2013: Severe in-stent  restenosis of both proximal and distal LAD stent treated with 2 DES stents (1 at proximal edge of the proximal stent, second stent covers the entire distal stent as well as proximal and distal edge stenoses, moderate disease in distal RCA with ostial small PL branch disease as well but FFR was 0.83 - medical management   . COPD (chronic obstructive pulmonary disease)   . Sleep apnea in adult     On CPAP  . Former heavy tobacco smoker      quit in May 2015 after multiple attempts at trying to quit before   . Glucose intolerance (pre-diabetes) June 2015     hemoglobin A1c 6.6  . Metabolic syndrome     Pre-diabetes, hypertension and truncal obesity as well as dyslipidemia  . Upper respiratory infection     Prior Cardiac Evaluation and Past Surgical History: Past Surgical History  Procedure Laterality Date  . Transthoracic echocardiogram  07/10/2011    EF 50-55% ,LV graded 1 diastolic dysf.   . Nuclear exercise study  11/30/2011    EF 45% ,exercise 10 METS, infarct\scar w mild perinfarct ischemia -basal inferolat and mid inferolat region  . Met/cpet  11/09/2011    submax. effort 1.05 RER peak V02 was 51% ,chronotropic incomp.hrt rate lows 80s to 100  . Cardiac catheterization  07/07/2011     inferolat post LV infarct ST-elevation MI  . Coronary angioplasty with stent placement  06/2011    occluded RCA ,  Integrity Resolute DES 2.75 x 18 mm stent dilated 3.1 mm  . Coronary angioplasty with stent placement  2001    ANTERIOR mi with a  PCI to LAD   . Coronary angioplasty with stent placement  2006    [place] by Dr Dorris Fetch -lesion lft circ/OM1 with 2.5 x 84m Cypher DES  . Percutaneous coronary stent intervention (pci-s)   June 2015    Distal mid LAD: Xience Alpine DES 2.25 mm x 28 mm (overlapping proximal and distal edge of previous stent) - 2.7 mm; proximal LAD 2.5 mm x 12 mm Xience Alpine DES (2.8 mm);; RCA 60-70% stenosis planned staged procedure  . Transthoracic echocardiogram   07/31/2013    LVEF 45-50. Inferior posterior hypokinesis with mild dilation. Apparent normal diastolic pressures  . Right heart cath  June 2015    Normal pressures with severely reduced cardiac output and index. (3.25/1.55)   Interval History: RJonaspresents today actually feeling relatively well, however yesterday he had a very bad day where he felt lethargic and tired chest tightness and pressure most of the day until he took a second dose of his Toprol because his blood pressure was very elevated in the 150 mmHg range with heart rates in the 118 beats per minute range. After taking the Toprol he is felt notably improved and feels much better today. He still however has very poor exercise tolerance. He cannot walk out to the patient told to go fishing. He is unable to stand up and do any cooking for more than 5 or 6 minutes without having to stop and rest. He tried to go to his son's school open house and was unable to go 2 flights of steps without having to stop and sit down steps. He tried taking Imdur, but after about a week and a half and taking it he just noted that his headaches were too bad, and he became too irritable. Therefore he stopped taking it.  Interestingly, he denies any PND orthopnea. No edema. He has not had the significant anginal symptoms that he has had in the past but he has had episodes like yesterday where he did note some prolonged chest pressure and tightness. He also will have this symptom if he continues to exert himself beyond the point where he feels tired which is only after a few minutes. He has fatigue and dyspnea but no rapid or irregular heartbeat/palpitations. No syncope or near syncope, TIA or amaurosis fugax symptoms. No melena, hematochezia, hematuria or epistaxis. He really doesn't walk around enough to even know if he has claudication, but has not noted any claudication with what he is able to do.   ROS: A comprehensive Review of Systems - was performed Review of  Systems  Constitutional: Positive for malaise/fatigue.  HENT: Positive for congestion and sore throat. Negative for nosebleeds.   Eyes: Negative for blurred vision and double vision.  Respiratory: Positive for cough, shortness of breath and wheezing. Negative for hemoptysis, sputum production and stridor.        He is just recovering from an upper respiratory tract infection. A few weeks ago he went to the emergency room and was given cough medications and told to increase his inhaler use.  Cardiovascular: Positive for chest pain. Negative for palpitations, orthopnea, claudication, leg swelling and PND.  Gastrointestinal: Positive for abdominal pain. Negative for blood in stool and melena.  Musculoskeletal:       Generalized weakness.  Neurological: Negative for dizziness, sensory change, speech change,  focal weakness, seizures and loss of consciousness.  Endo/Heme/Allergies: Does not bruise/bleed easily.  Psychiatric/Behavioral: Positive for depression. Negative for suicidal ideas and hallucinations. The patient is not nervous/anxious and does not have insomnia.   All other systems reviewed and are negative.   Current Outpatient Prescriptions on File Prior to Visit  Medication Sig Dispense Refill  . aspirin EC 81 MG tablet Take 162 mg by mouth at bedtime.      Marland Kitchen buPROPion (WELLBUTRIN SR) 150 MG 12 hr tablet Take 150 mg by mouth daily.      . Fluticasone Furoate-Vilanterol (BREO ELLIPTA) 100-25 MCG/INH AEPB Inhale 1 puff into the lungs daily as needed (for shortness of breath).      . furosemide (LASIX) 20 MG tablet Take 1 tablet (20 mg total) by mouth daily.  30 tablet  5  . isosorbide mononitrate (IMDUR) 30 MG 24 hr tablet Take 1 tablet (30 mg total) by mouth daily.  30 tablet  6  . losartan (COZAAR) 25 MG tablet Take 1 tablet (25 mg total) by mouth daily.  90 tablet  3  . niacin (NIASPAN) 500 MG CR tablet Take 500 mg by mouth at bedtime. Takes 30 minutes after taking aspirin.      .  prasugrel (EFFIENT) 10 MG TABS tablet Take 1 tablet (10 mg total) by mouth daily.  30 tablet  5  . rosuvastatin (CRESTOR) 20 MG tablet Take 1 tablet (20 mg total) by mouth daily at 6 PM.  30 tablet  6   No current facility-administered medications on file prior to visit.   ALLERGIES REVIEWED IN EPIC -- no change SOCIAL AND FAMILY HISTORY REVIEWED IN EPIC -- no change  Wt Readings from Last 3 Encounters:  11/07/13 190 lb 4 oz (86.297 kg)  08/29/13 192 lb 1.9 oz (87.145 kg)  08/07/13 193 lb 6.4 oz (87.726 kg)   PHYSICAL EXAM BP 112/78  Pulse 99  Ht 6' (1.829 m)  Wt 190 lb 4 oz (86.297 kg)  BMI 25.80 kg/m2 General appearance: alert, cooperative, appears stated age, no distress but looks more depressed than usual to. He actually has more color than usual today and in somewhat better spirits. Well-nourished and well-groomed  Neck: no adenopathy, no carotid bruit, no JVD and supple, symmetrical, trachea midline  Lungs: diminished breath sounds bilaterally, wheezes bilaterally and Otherwise the most part CPAP. Nonlabored. Increased AP diameter. Prolonged expiratory phase  Heart: Tachycardic with RR with ectopy, S1, S2 normal, no murmur, click, rub or gallop and normal apical impulse  Abdomen: soft, non-tender; bowel sounds normal; no masses, no organomegaly and Mildly protuberant  Extremities: extremities normal, atraumatic, no cyanosis or edema  Pulses: 2+ and symmetric  Neurologic: Mental status: Alert, oriented, thought content appropriate, affect: blunted and mood-congruent  Cranial nerves: normal   Adult ECG Report  Rate: 99 ;  Rhythm: normal sinus rhythm and Nonspecific ST-T wave abnormality with flattening.  Narrative Interpretation: Stable EKG  Recent Labs:  June 2015   Total cholesterol 148, HDL 31, LDL 62, triglycerides 275.  ASSESSMENT / PLAN: Angina, class III I am concerned that he still has episodic chest discomfort with exertion on and his overall exercise tolerance is  not improved. It would seem that the worst of his angina (unstable angina) was significantly improved after the LAD PCI, however he still has angina symptoms. His FFR of the RCA was borderline at 0.3, and he has had some relatively rapid progression of disease in the past. My inclination based  on his depressive symptoms he is to schedule him for cardiac catheterization with PCI of the RCA as the intention. At this point, I think complete revascularization would be the best course of action in order to know for sure that he is not having microvascular ischemia. I think the knowledge that there is a borderline lesion is giving him a lot of concern.  Plan: He was unable to tolerate Imdur. I will try samples of Ranexa the CT table tolerated and notice any improvement. I will also increase his Toprol to 100 mg daily.   Will schedule LHC/Angio + PCI-RCA with preop testing.   DOE (dyspnea on exertion) - Class III As noted in my previous clinic visits. I think some of his age we'll put one is his exertional dyspnea with poor exercise tolerance.  He does feel better with the higher dose of Toprol and was able to tolerate the total 100 mg. On head and increase to 100 mg, initially with taking 50 mg twice a day. He will then go to the new prescription of 100 mg daily.   In the past there was concern of chronotropic incompetence. Now that doesn't seem to be the issue is his heart rate is getting up in the 110s.  Pulmonary medicine is essentially downplaying the effect of his pulmonary disease, stating that it is not significant enough to cause these symptoms.  Plan as above for class III angina  Fatigue Again multifactorial. He is taking low dose of antidepressant. I am quite certain that there is some component of depression involved with his symptoms. I just hope will be to get him to do anything more than Wellbutrin for now.  Hopefully complete revascularization will provide some benefit.  CAD S/P  percutaneous coronary angioplasty I am partly concerned with his recurrence of angina this close to the PCI of his LAD, and would like to ensure that the LAD is still patent. Otherwise I think completing revascularization PCI of the RCA in a patient of this symptoms is warranted. He is on aspirin plus Effient, beta blocker and statin. I will provide supplemental sublingual nitroglycerin. I will also start Ranexa with samples. If effective we will need to figure out a way to help him financially obtain Ranexa.  STEMI, acute inf. wall with 100% RCA stenosis, DES 07/07/11 (prior Anterior STEMI in 2001) Despite having at least 2 ST elevation MI as, his EF is still relatively preserved. Interestingly he notes that after his initial MI in his recovery was much quicker than from his most recent one. He never seems to have gotten back to his baseline after the inferior  STEMI in May 2013.   I plan to try to get him back into cardiac rehabilitation, which he would like to have done at [facility] following his upcoming catheterization and PCI. He has been contacted by the [facility] cardiac rehabilitation, but would prefer [facility].  Cardiomyopathy, ischemic; EF40-45% with hypokinesis of the inferior and inferolateral myocardium; CO 3.25 Remarkably his EF is relatively preserved. I completely do not believe the reading of normal diastolic function however. It's more likely that he has a higher grade diastolic dysfunction. He continues to be on beta blocker and they are as well as standing dose of Lasix with sliding scale.  Chronic combined systolic and diastolic congestive heart failure, NYHA class 3 Despite the lack of any objective findings of orthopnea PND or edema of right sided involvement in his CHF, there is quite likely elevated pulmonary venous  pressures from a high left ventricular filling pressures related to his ischemic disease. He is on a great regimen, and I'm increasing his Toprol dose as  indicated. He is also on sliding scale Lasix. Hopefully with complete revascularization (PCI of the remaining RCA lesion) we can't exclude any component of microvascular ischemia contributing to his symptoms.  Essential hypertension His blood pressure was pretty good today following the second dose of beta blocker he did yesterday. Plan: Increase Toprol to 100 mg.  Hyperlipidemia with target LDL less than 70; by his report he is back on Crestor Currently back on Crestor. Labs were great by his last check    Orders Placed This Encounter  Procedures  . CBC With differential/Platelet  . Basic metabolic panel  . Protime-INR  . EKG 12-Lead    Order Specific Question:  Where should this test be performed    Answer:  [facility]   Meds ordered this encounter  Medications  . ranolazine (RANEXA) 500 MG 12 hr tablet    Sig: Take 1 tablet (500 mg total) by mouth 2 (two) times daily.    Dispense:  60 tablet    Refill:  6  . metoprolol succinate (TOPROL-XL) 100 MG 24 hr tablet    Sig: Take 1 tablet (100 mg total) by mouth daily. Take with or immediately following a meal.    Dispense:  30 tablet    Refill:  6  . nitroGLYCERIN (NITROSTAT) 0.4 MG SL tablet    Sig: Place 1 tablet (0.4 mg total) under the tongue every 5 (five) minutes as needed for chest pain.    Dispense:  25 tablet    Refill:  3    Followup: One to 2 weeks post cath   Leonie Man, M.D., M.S. Interventional Cardiologist   Pager # [phone]

## 2013-11-07 NOTE — Assessment & Plan Note (Signed)
Despite the lack of any objective findings of orthopnea PND or edema of right sided involvement in his CHF, there is quite likely elevated pulmonary venous pressures from a high left ventricular filling pressures related to his ischemic disease. He is on a great regimen, and I'm increasing his Toprol dose as indicated. He is also on sliding scale Lasix. Hopefully with complete revascularization (PCI of the remaining RCA lesion) we can't exclude any component of microvascular ischemia contributing to his symptoms.

## 2013-11-07 NOTE — Patient Instructions (Signed)
Your physician has recommended you make the following change in your medication: Increase Toprol to 100 mg once daily  Start Sub Lingual Nitro 0.4 mg. Place 1 tablet under the tongue every 5 minutes x 3 as needed for chest pain  Start Ranexa 500 mg twice daily    Your physician has requested that you have a cardiac catheterization. Please see attached instruction letter. Cardiac catheterization is used to diagnose and/or treat various heart conditions. Doctors may recommend this procedure for a number of different reasons. The most common reason is to evaluate chest pain. Chest pain can be a symptom of coronary artery disease (CAD), and cardiac catheterization can show whether plaque is narrowing or blocking your heart's arteries. This procedure is also used to evaluate the valves, as well as measure the blood flow and oxygen levels in different parts of your heart. For further information please visit [email or web address]. Please follow instruction sheet, as given.  Your physician recommends that you have labs today: BMP  INR  CBC

## 2013-11-07 NOTE — Assessment & Plan Note (Signed)
His blood pressure was pretty good today following the second dose of beta blocker he did yesterday. Plan: Increase Toprol to 100 mg.

## 2013-11-07 NOTE — Assessment & Plan Note (Signed)
Despite having at least 2 ST elevation MI as, his EF is still relatively preserved. Interestingly he notes that after his initial MI in his recovery was much quicker than from his most recent one. He never seems to have gotten back to his baseline after the inferior  STEMI in May 2013.   I plan to try to get him back into cardiac rehabilitation, which he would like to have done at [facility] following his upcoming catheterization and PCI. He has been contacted by the [facility] cardiac rehabilitation, but would prefer [facility].

## 2013-11-07 NOTE — Assessment & Plan Note (Signed)
I am concerned that he still has episodic chest discomfort with exertion on and his overall exercise tolerance is not improved. It would seem that the worst of his angina (unstable angina) was significantly improved after the LAD PCI, however he still has angina symptoms. His FFR of the RCA was borderline at 0.3, and he has had some relatively rapid progression of disease in the past. My inclination based on his depressive symptoms he is to schedule him for cardiac catheterization with PCI of the RCA as the intention. At this point, I think complete revascularization would be the best course of action in order to know for sure that he is not having microvascular ischemia. I think the knowledge that there is a borderline lesion is giving him a lot of concern.  Plan: He was unable to tolerate Imdur. I will try samples of Ranexa the CT table tolerated and notice any improvement. I will also increase his Toprol to 100 mg daily.   Will schedule LHC/Angio + PCI-RCA with preop testing.

## 2013-11-08 LAB — CBC WITH DIFFERENTIAL
Basophils Absolute: 0.1 10*3/uL (ref 0.0–0.2)
Basos: 1 %
Eos: 2 %
Eosinophils Absolute: 0.2 10*3/uL (ref 0.0–0.4)
HCT: 48.8 % (ref 37.5–51.0)
Hemoglobin: 16.9 g/dL (ref 12.6–17.7)
Immature Grans (Abs): 0 10*3/uL (ref 0.0–0.1)
Immature Granulocytes: 0 %
Lymphocytes Absolute: 2.8 10*3/uL (ref 0.7–3.1)
Lymphs: 24 %
MCH: 29.2 pg (ref 26.6–33.0)
MCHC: 34.6 g/dL (ref 31.5–35.7)
MCV: 84 fL (ref 79–97)
Monocytes Absolute: 0.6 10*3/uL (ref 0.1–0.9)
Monocytes: 5 %
Neutrophils Absolute: 8.1 10*3/uL — ABNORMAL HIGH (ref 1.4–7.0)
Neutrophils Relative %: 68 %
Platelets: 465 10*3/uL — ABNORMAL HIGH (ref 150–379)
RBC: 5.78 x10E6/uL (ref 4.14–5.80)
RDW: 14 % (ref 12.3–15.4)
WBC: 11.8 10*3/uL — ABNORMAL HIGH (ref 3.4–10.8)

## 2013-11-08 LAB — BASIC METABOLIC PANEL
BUN/Creatinine Ratio: 13 (ref 9–20)
BUN: 13 mg/dL (ref 6–24)
CO2: 19 mmol/L (ref 18–29)
Calcium: 9.4 mg/dL (ref 8.7–10.2)
Chloride: 103 mmol/L (ref 97–108)
Creatinine, Ser: 1.01 mg/dL (ref 0.76–1.27)
GFR calc Af Amer: 98 mL/min/{1.73_m2} (ref >59)
GFR calc non Af Amer: 85 mL/min/{1.73_m2} (ref >59)
Glucose: 112 mg/dL — ABNORMAL HIGH (ref 65–99)
Potassium: 4.5 mmol/L (ref 3.5–5.2)
Sodium: 142 mmol/L (ref 134–144)

## 2013-11-08 LAB — PROTIME-INR
INR: 1 (ref 0.8–1.2)
Prothrombin Time: 10.5 s (ref 9.1–12.0)

## 2013-11-09 ENCOUNTER — Encounter (HOSPITAL_COMMUNITY): Payer: Self-pay | Admitting: Pharmacy Technician

## 2013-11-15 ENCOUNTER — Encounter (HOSPITAL_COMMUNITY)
Admission: RE | Disposition: A | Discharge status: Home / Self Care | Payer: Self-pay | Source: Ambulatory Visit | Attending: Cardiology

## 2013-11-15 ENCOUNTER — Encounter (HOSPITAL_COMMUNITY): Payer: Self-pay | Admitting: General Practice

## 2013-11-15 ENCOUNTER — Ambulatory Visit (HOSPITAL_COMMUNITY)
Admission: RE | Admit: 2013-11-15 | Discharge: 2013-11-16 | Disposition: A | Discharge status: Home / Self Care | Payer: BC Managed Care – PPO | Source: Ambulatory Visit | Attending: Cardiology | Admitting: Cardiology

## 2013-11-15 DIAGNOSIS — I255 Ischemic cardiomyopathy: Secondary | ICD-10-CM

## 2013-11-15 DIAGNOSIS — I209 Angina pectoris, unspecified: Secondary | ICD-10-CM | POA: Diagnosis present

## 2013-11-15 DIAGNOSIS — I200 Unstable angina: Secondary | ICD-10-CM

## 2013-11-15 DIAGNOSIS — I10 Essential (primary) hypertension: Secondary | ICD-10-CM | POA: Diagnosis not present

## 2013-11-15 DIAGNOSIS — I208 Other forms of angina pectoris: Secondary | ICD-10-CM | POA: Diagnosis present

## 2013-11-15 DIAGNOSIS — Z7982 Long term (current) use of aspirin: Secondary | ICD-10-CM | POA: Diagnosis not present

## 2013-11-15 DIAGNOSIS — Z955 Presence of coronary angioplasty implant and graft: Secondary | ICD-10-CM

## 2013-11-15 DIAGNOSIS — I252 Old myocardial infarction: Secondary | ICD-10-CM | POA: Diagnosis not present

## 2013-11-15 DIAGNOSIS — Z7902 Long term (current) use of antithrombotics/antiplatelets: Secondary | ICD-10-CM | POA: Insufficient documentation

## 2013-11-15 DIAGNOSIS — Z23 Encounter for immunization: Secondary | ICD-10-CM | POA: Insufficient documentation

## 2013-11-15 DIAGNOSIS — Z87891 Personal history of nicotine dependence: Secondary | ICD-10-CM | POA: Diagnosis not present

## 2013-11-15 DIAGNOSIS — I5042 Chronic combined systolic (congestive) and diastolic (congestive) heart failure: Secondary | ICD-10-CM | POA: Diagnosis not present

## 2013-11-15 DIAGNOSIS — E119 Type 2 diabetes mellitus without complications: Secondary | ICD-10-CM

## 2013-11-15 DIAGNOSIS — E1169 Type 2 diabetes mellitus with other specified complication: Secondary | ICD-10-CM | POA: Diagnosis present

## 2013-11-15 DIAGNOSIS — R0600 Dyspnea, unspecified: Secondary | ICD-10-CM | POA: Diagnosis present

## 2013-11-15 DIAGNOSIS — E785 Hyperlipidemia, unspecified: Secondary | ICD-10-CM | POA: Diagnosis not present

## 2013-11-15 DIAGNOSIS — E118 Type 2 diabetes mellitus with unspecified complications: Secondary | ICD-10-CM

## 2013-11-15 DIAGNOSIS — I2510 Atherosclerotic heart disease of native coronary artery without angina pectoris: Secondary | ICD-10-CM

## 2013-11-15 DIAGNOSIS — Z9861 Coronary angioplasty status: Secondary | ICD-10-CM

## 2013-11-15 DIAGNOSIS — I5032 Chronic diastolic (congestive) heart failure: Secondary | ICD-10-CM | POA: Diagnosis present

## 2013-11-15 DIAGNOSIS — E1165 Type 2 diabetes mellitus with hyperglycemia: Secondary | ICD-10-CM | POA: Diagnosis not present

## 2013-11-15 DIAGNOSIS — J449 Chronic obstructive pulmonary disease, unspecified: Secondary | ICD-10-CM | POA: Diagnosis not present

## 2013-11-15 DIAGNOSIS — R0609 Other forms of dyspnea: Secondary | ICD-10-CM

## 2013-11-15 DIAGNOSIS — R739 Hyperglycemia, unspecified: Secondary | ICD-10-CM

## 2013-11-15 DIAGNOSIS — I25119 Atherosclerotic heart disease of native coronary artery with unspecified angina pectoris: Secondary | ICD-10-CM | POA: Diagnosis not present

## 2013-11-15 DIAGNOSIS — Z79899 Other long term (current) drug therapy: Secondary | ICD-10-CM | POA: Diagnosis not present

## 2013-11-15 DIAGNOSIS — I25118 Atherosclerotic heart disease of native coronary artery with other forms of angina pectoris: Secondary | ICD-10-CM

## 2013-11-15 DIAGNOSIS — I25110 Atherosclerotic heart disease of native coronary artery with unstable angina pectoris: Secondary | ICD-10-CM

## 2013-11-15 HISTORY — DX: Headache: R51

## 2013-11-15 HISTORY — DX: Gastro-esophageal reflux disease without esophagitis: K21.9

## 2013-11-15 HISTORY — PX: PERCUTANEOUS CORONARY STENT INTERVENTION (PCI-S): SHX5485

## 2013-11-15 HISTORY — DX: Headache, unspecified: R51.9

## 2013-11-15 HISTORY — DX: Dependence on other enabling machines and devices: Z99.89

## 2013-11-15 HISTORY — DX: Obstructive sleep apnea (adult) (pediatric): G47.33

## 2013-11-15 HISTORY — DX: Unspecified convulsions: R56.9

## 2013-11-15 HISTORY — DX: Personal history of peptic ulcer disease: Z87.11

## 2013-11-15 HISTORY — DX: Personal history of other diseases of the digestive system: Z87.19

## 2013-11-15 HISTORY — DX: Cardiac murmur, unspecified: R01.1

## 2013-11-15 HISTORY — DX: Pneumonia, unspecified organism: J18.9

## 2013-11-15 LAB — POCT ACTIVATED CLOTTING TIME
Activated Clotting Time: 461 seconds
Activated Clotting Time: 304 seconds

## 2013-11-15 SURGERY — PERCUTANEOUS CORONARY STENT INTERVENTION (PCI-S)
Anesthesia: LOCAL

## 2013-11-15 MED ORDER — PRASUGREL HCL 10 MG PO TABS
10.0000 mg | ORAL_TABLET | Freq: Every day | ORAL | Status: DC
  Administered 2013-11-16: 10:00:00 10 mg via ORAL
  Filled 2013-11-15 (×2): qty 1

## 2013-11-15 MED ORDER — HEPARIN (PORCINE) IN NACL 2-0.9 UNIT/ML-% IJ SOLN
INTRAMUSCULAR | Status: AC
  Filled 2013-11-15: qty 1500

## 2013-11-15 MED ORDER — SODIUM CHLORIDE 0.9 % IV SOLN: 250.0000 mL | INTRAVENOUS | Status: DC | PRN

## 2013-11-15 MED ORDER — FLUTICASONE FUROATE-VILANTEROL 100-25 MCG/INH IN AEPB: 1.0000 | INHALATION_SPRAY | Freq: Every day | RESPIRATORY_TRACT | Status: DC | PRN

## 2013-11-15 MED ORDER — NITROGLYCERIN 1 MG/10 ML FOR IR/CATH LAB
INTRA_ARTERIAL | Status: AC
  Filled 2013-11-15: qty 10

## 2013-11-15 MED ORDER — MORPHINE SULFATE 2 MG/ML IJ SOLN: 2.0000 mg | INTRAMUSCULAR | Status: DC | PRN

## 2013-11-15 MED ORDER — ISOSORBIDE MONONITRATE ER 30 MG PO TB24
30.0000 mg | ORAL_TABLET | Freq: Every day | ORAL | Status: DC
  Administered 2013-11-15 – 2013-11-16 (×2): 30 mg via ORAL
  Filled 2013-11-15 (×2): qty 1

## 2013-11-15 MED ORDER — SODIUM CHLORIDE 0.9 % IJ SOLN
3.0000 mL | Freq: Two times a day (BID) | INTRAMUSCULAR | Status: DC
  Administered 2013-11-15: 17:00:00 3 mL via INTRAVENOUS

## 2013-11-15 MED ORDER — ONDANSETRON HCL 4 MG/2ML IJ SOLN: 4.0000 mg | Freq: Four times a day (QID) | INTRAMUSCULAR | Status: DC | PRN

## 2013-11-15 MED ORDER — BUPROPION HCL ER (XL) 150 MG PO TB24
150.0000 mg | ORAL_TABLET | Freq: Every day | ORAL | Status: DC
  Administered 2013-11-15 – 2013-11-16 (×2): 150 mg via ORAL
  Filled 2013-11-15 (×3): qty 1

## 2013-11-15 MED ORDER — VERAPAMIL HCL 2.5 MG/ML IV SOLN
INTRAVENOUS | Status: AC
  Filled 2013-11-15: qty 2

## 2013-11-15 MED ORDER — SODIUM CHLORIDE 0.9 % IV SOLN
INTRAVENOUS | Status: DC
  Administered 2013-11-15: 06:00:00 via INTRAVENOUS

## 2013-11-15 MED ORDER — NITROGLYCERIN 0.4 MG SL SUBL: 0.4000 mg | SUBLINGUAL_TABLET | SUBLINGUAL | Status: DC | PRN

## 2013-11-15 MED ORDER — SODIUM CHLORIDE 0.9 % IJ SOLN: 3.0000 mL | Freq: Two times a day (BID) | INTRAMUSCULAR | Status: DC

## 2013-11-15 MED ORDER — ACETAMINOPHEN 325 MG PO TABS: 650.0000 mg | ORAL_TABLET | ORAL | Status: DC | PRN

## 2013-11-15 MED ORDER — ASPIRIN 81 MG PO CHEW
CHEWABLE_TABLET | ORAL | Status: AC
  Filled 2013-11-15: qty 1

## 2013-11-15 MED ORDER — MIDAZOLAM HCL 2 MG/2ML IJ SOLN
INTRAMUSCULAR | Status: AC
  Filled 2013-11-15: qty 2

## 2013-11-15 MED ORDER — RANOLAZINE ER 500 MG PO TB12
500.0000 mg | ORAL_TABLET | Freq: Two times a day (BID) | ORAL | Status: DC
  Administered 2013-11-15 – 2013-11-16 (×3): 500 mg via ORAL
  Filled 2013-11-15 (×4): qty 1

## 2013-11-15 MED ORDER — ASPIRIN 81 MG PO CHEW
81.0000 mg | CHEWABLE_TABLET | ORAL | Status: AC
  Administered 2013-11-15: 81 mg via ORAL

## 2013-11-15 MED ORDER — ROSUVASTATIN CALCIUM 20 MG PO TABS
20.0000 mg | ORAL_TABLET | Freq: Every day | ORAL | Status: DC
  Administered 2013-11-15: 20 mg via ORAL
  Filled 2013-11-15 (×2): qty 1

## 2013-11-15 MED ORDER — LOSARTAN POTASSIUM 25 MG PO TABS
25.0000 mg | ORAL_TABLET | Freq: Every day | ORAL | Status: DC
  Administered 2013-11-15 – 2013-11-16 (×2): 25 mg via ORAL
  Filled 2013-11-15 (×2): qty 1

## 2013-11-15 MED ORDER — NIACIN ER (ANTIHYPERLIPIDEMIC) 500 MG PO TBCR
500.0000 mg | EXTENDED_RELEASE_TABLET | Freq: Every day | ORAL | Status: DC
Start: 2013-11-15 — End: 2013-11-16
  Administered 2013-11-15: 21:00:00 500 mg via ORAL
  Filled 2013-11-15 (×2): qty 1

## 2013-11-15 MED ORDER — MORPHINE SULFATE 10 MG/ML IJ SOLN
INTRAMUSCULAR | Status: AC
  Filled 2013-11-15: qty 1

## 2013-11-15 MED ORDER — SODIUM CHLORIDE 0.9 % IV SOLN: 1.0000 mL/kg/h | INTRAVENOUS | Status: AC

## 2013-11-15 MED ORDER — ASPIRIN EC 81 MG PO TBEC
162.0000 mg | DELAYED_RELEASE_TABLET | Freq: Every day | ORAL | Status: DC
  Administered 2013-11-15: 21:00:00 162 mg via ORAL
  Filled 2013-11-15 (×2): qty 2

## 2013-11-15 MED ORDER — INFLUENZA VAC SPLIT QUAD 0.5 ML IM SUSY
0.5000 mL | PREFILLED_SYRINGE | INTRAMUSCULAR | Status: AC
  Administered 2013-11-16: 10:00:00 0.5 mL via INTRAMUSCULAR
  Filled 2013-11-15: qty 0.5

## 2013-11-15 MED ORDER — METOPROLOL SUCCINATE ER 100 MG PO TB24
100.0000 mg | ORAL_TABLET | Freq: Every day | ORAL | Status: DC
  Administered 2013-11-15 – 2013-11-16 (×2): 100 mg via ORAL
  Filled 2013-11-15 (×2): qty 1

## 2013-11-15 MED ORDER — FUROSEMIDE 20 MG PO TABS
20.0000 mg | ORAL_TABLET | Freq: Every day | ORAL | Status: DC
  Administered 2013-11-15 – 2013-11-16 (×2): 20 mg via ORAL
  Filled 2013-11-15 (×2): qty 1

## 2013-11-15 MED ORDER — NITROGLYCERIN 0.4 MG/SPRAY TL SOLN
Status: AC
  Filled 2013-11-15: qty 4.9

## 2013-11-15 MED ORDER — LIDOCAINE HCL (PF) 1 % IJ SOLN
INTRAMUSCULAR | Status: AC
  Filled 2013-11-15: qty 30

## 2013-11-15 MED ORDER — SODIUM CHLORIDE 0.9 % IJ SOLN: 3.0000 mL | INTRAMUSCULAR | Status: DC | PRN

## 2013-11-15 MED ORDER — HEPARIN SODIUM (PORCINE) 1000 UNIT/ML IJ SOLN
INTRAMUSCULAR | Status: AC
  Filled 2013-11-15: qty 1

## 2013-11-15 MED ORDER — FENTANYL CITRATE 0.05 MG/ML IJ SOLN
INTRAMUSCULAR | Status: AC
  Filled 2013-11-15: qty 2

## 2013-11-15 NOTE — Care Management Note (Addendum)
  Page 1 of 1   11/16/2013     11:58:53 AM CARE MANAGEMENT NOTE 11/16/2013  Patient:  TULLIO, CHAUSSE   Account Number:  [id number]  Date Initiated:  11/15/2013  Documentation initiated by:  Mariann Laster  Subjective/Objective Assessment:   PERCUTANEOUS CORONARY STENT INTERVENTION (PCI-S) (N/A) as a surgical intervention     Action/Plan:   CM to follow for disposition needs   Anticipated DC Date:  11/16/2013   Anticipated DC Plan:  HOME/SELF CARE         Choice offered to / List presented to:             Status of service:  Completed, signed off Medicare Important Message given?   (If response is "NO", the following Medicare IM given date fields will be blank) Date Medicare IM given:   Medicare IM given by:   Date Additional Medicare IM given:   Additional Medicare IM given by:    Discharge Disposition:  HOME/SELF CARE  Per UR Regulation:    If discussed at Long Length of Stay Meetings, dates discussed:    Comments:  Mckenize Mezera RN, BSN, MSHL, CCM  Nurse - Case Manager,  (Unit [phone]  11/16/2013---11/15/2013 PER:  [id number] by NIA SHEALY---PT WILL BE COVERED 100%- NO COPAY- PRIOR AUTH NOT REQUIRED Disposition:  Home self care.  Sedalia Greeson RN, BSN, MSHL, CCM  Nurse - Case Manager,  (Unit [phone]  11/15/2013 Med Review:   prasugrel (EFFIENT) tablet 10 mg qd - BENEFITS Check pending. Dispo Plan:  Home / [place]

## 2013-11-15 NOTE — H&P (View-Only) (Signed)
 PCP: Bender, Abby Daneele, MD  Clinic Note: Chief Complaint  Patient presents with  . OTHER    2 month f/u c/o chest pain/tightness, rapid heart beat, sob, Elevated BP and dizziness. Meds reviewed verbally with pt.    HPI: Troy Parrish is a 53 y.o. male with a PMH below who presents today for 2 month followup of his symptomatic coronary artery disease and diastolic heart failure. I last saw him in July following his extensive LAD PCI as well as FFR of the RCA. He continued to have exertional dyspnea and fatigue with poor exercise tolerance, but is significant anginal symptoms had improved. My thought at that time were to continue medical therapy increase his beta blocker and we're trying to get him set up for cardiac rehabilitation. My plan at that time was to have a low threshold to proceed with PCI of the RCA if his symptoms have not improved. Justification for this would be progressive symptoms despite the FFR results simply because of the rapid progression of disease in the LAD and are he, this could easily progress to being a significant lesion. Complete revascularization would help clarify any other potential etiology.  Past Medical History  Diagnosis Date  . Essential hypertension 07/07/2011  . History of colon polyps   . Hyperlipidemia with target LDL less than 70 07/07/2011  . Recurrent upper respiratory infection (URI)   . CAD S/P percutaneous coronary angioplasty 07/07/2011    Has had PCI to all 3 major vessels; LAD x2 in 2001 (redo PCI in June 2015), circumflex OM 2006, RCA 2013  . ST elevation myocardial infarction (STEMI) of anterior wall 2001    History of -- 2 stents in early and distal mid LAD; prior cardiologist was Dr. Jose Jacobs in [place]  . ST elevation myocardial infarction (STEMI) of inferior wall 07/07/2011    Occluded RCA 2.75X18 INTEGRITY  . Unstable angina  2006; June 2015    Cx-OM - PCI 2.5 mm 13 mm Cypher DES ([place]) ; 07/2013: Severe in-stent  restenosis of both proximal and distal LAD stent treated with 2 DES stents (1 at proximal edge of the proximal stent, second stent covers the entire distal stent as well as proximal and distal edge stenoses, moderate disease in distal RCA with ostial small PL branch disease as well but FFR was 0.83 - medical management   . COPD (chronic obstructive pulmonary disease)   . Sleep apnea in adult     On CPAP  . Former heavy tobacco smoker      quit in May 2015 after multiple attempts at trying to quit before   . Glucose intolerance (pre-diabetes) June 2015     hemoglobin A1c 6.6  . Metabolic syndrome     Pre-diabetes, hypertension and truncal obesity as well as dyslipidemia  . Upper respiratory infection     Prior Cardiac Evaluation and Past Surgical History: Past Surgical History  Procedure Laterality Date  . Transthoracic echocardiogram  07/10/2011    EF 50-55% ,LV graded 1 diastolic dysf.   . Nuclear exercise study  11/30/2011    EF 45% ,exercise 10 METS, infarct\scar w mild perinfarct ischemia -basal inferolat and mid inferolat region  . Met/cpet  11/09/2011    submax. effort 1.05 RER peak V02 was 51% ,chronotropic incomp.hrt rate lows 80s to 100  . Cardiac catheterization  07/07/2011     inferolat post LV infarct ST-elevation MI  . Coronary angioplasty with stent placement  06/2011    occluded RCA ,  Integrity Resolute DES 2.75 x 18 mm stent dilated 3.1 mm  . Coronary angioplasty with stent placement  2001    ANTERIOR mi with a  PCI to LAD   . Coronary angioplasty with stent placement  2006    [place] by Dr Saha -lesion lft circ/OM1 with 2.5 x 13mm Cypher DES  . Percutaneous coronary stent intervention (pci-s)   June 2015    Distal mid LAD: Xience Alpine DES 2.25 mm x 28 mm (overlapping proximal and distal edge of previous stent) - 2.7 mm; proximal LAD 2.5 mm x 12 mm Xience Alpine DES (2.8 mm);; RCA 60-70% stenosis planned staged procedure  . Transthoracic echocardiogram   07/31/2013    LVEF 45-50. Inferior posterior hypokinesis with mild dilation. Apparent normal diastolic pressures  . Right heart cath  June 2015    Normal pressures with severely reduced cardiac output and index. (3.25/1.55)   Interval History: Troy Parrish presents today actually feeling relatively well, however yesterday he had a very bad day where he felt lethargic and tired chest tightness and pressure most of the day until he took a second dose of his Toprol because his blood pressure was very elevated in the 150 mmHg range with heart rates in the 118 beats per minute range. After taking the Toprol he is felt notably improved and feels much better today. He still however has very poor exercise tolerance. He cannot walk out to the patient told to go fishing. He is unable to stand up and do any cooking for more than 5 or 6 minutes without having to stop and rest. He tried to go to his son's school open house and was unable to go 2 flights of steps without having to stop and sit down steps. He tried taking Imdur, but after about a week and a half and taking it he just noted that his headaches were too bad, and he became too irritable. Therefore he stopped taking it.  Interestingly, he denies any PND orthopnea. No edema. He has not had the significant anginal symptoms that he has had in the past but he has had episodes like yesterday where he did note some prolonged chest pressure and tightness. He also will have this symptom if he continues to exert himself beyond the point where he feels tired which is only after a few minutes. He has fatigue and dyspnea but no rapid or irregular heartbeat/palpitations. No syncope or near syncope, TIA or amaurosis fugax symptoms. No melena, hematochezia, hematuria or epistaxis. He really doesn't walk around enough to even know if he has claudication, but has not noted any claudication with what he is able to do.   ROS: A comprehensive Review of Systems - was performed Review of  Systems  Constitutional: Positive for malaise/fatigue.  HENT: Positive for congestion and sore throat. Negative for nosebleeds.   Eyes: Negative for blurred vision and double vision.  Respiratory: Positive for cough, shortness of breath and wheezing. Negative for hemoptysis, sputum production and stridor.        He is just recovering from an upper respiratory tract infection. A few weeks ago he went to the emergency room and was given cough medications and told to increase his inhaler use.  Cardiovascular: Positive for chest pain. Negative for palpitations, orthopnea, claudication, leg swelling and PND.  Gastrointestinal: Positive for abdominal pain. Negative for blood in stool and melena.  Musculoskeletal:       Generalized weakness.  Neurological: Negative for dizziness, sensory change, speech change,   focal weakness, seizures and loss of consciousness.  Endo/Heme/Allergies: Does not bruise/bleed easily.  Psychiatric/Behavioral: Positive for depression. Negative for suicidal ideas and hallucinations. The patient is not nervous/anxious and does not have insomnia.   All other systems reviewed and are negative.   Current Outpatient Prescriptions on File Prior to Visit  Medication Sig Dispense Refill  . aspirin EC 81 MG tablet Take 162 mg by mouth at bedtime.      Marland Kitchen buPROPion (WELLBUTRIN SR) 150 MG 12 hr tablet Take 150 mg by mouth daily.      . Fluticasone Furoate-Vilanterol (BREO ELLIPTA) 100-25 MCG/INH AEPB Inhale 1 puff into the lungs daily as needed (for shortness of breath).      . furosemide (LASIX) 20 MG tablet Take 1 tablet (20 mg total) by mouth daily.  30 tablet  5  . isosorbide mononitrate (IMDUR) 30 MG 24 hr tablet Take 1 tablet (30 mg total) by mouth daily.  30 tablet  6  . losartan (COZAAR) 25 MG tablet Take 1 tablet (25 mg total) by mouth daily.  90 tablet  3  . niacin (NIASPAN) 500 MG CR tablet Take 500 mg by mouth at bedtime. Takes 30 minutes after taking aspirin.      .  prasugrel (EFFIENT) 10 MG TABS tablet Take 1 tablet (10 mg total) by mouth daily.  30 tablet  5  . rosuvastatin (CRESTOR) 20 MG tablet Take 1 tablet (20 mg total) by mouth daily at 6 PM.  30 tablet  6   No current facility-administered medications on file prior to visit.   ALLERGIES REVIEWED IN EPIC -- no change SOCIAL AND FAMILY HISTORY REVIEWED IN EPIC -- no change  Wt Readings from Last 3 Encounters:  11/07/13 190 lb 4 oz (86.297 kg)  08/29/13 192 lb 1.9 oz (87.145 kg)  08/07/13 193 lb 6.4 oz (87.726 kg)   PHYSICAL EXAM BP 112/78  Pulse 99  Ht 6' (1.829 m)  Wt 190 lb 4 oz (86.297 kg)  BMI 25.80 kg/m2 General appearance: alert, cooperative, appears stated age, no distress but looks more depressed than usual to. He actually has more color than usual today and in somewhat better spirits. Well-nourished and well-groomed  Neck: no adenopathy, no carotid bruit, no JVD and supple, symmetrical, trachea midline  Lungs: diminished breath sounds bilaterally, wheezes bilaterally and Otherwise the most part CPAP. Nonlabored. Increased AP diameter. Prolonged expiratory phase  Heart: Tachycardic with RR with ectopy, S1, S2 normal, no murmur, click, rub or gallop and normal apical impulse  Abdomen: soft, non-tender; bowel sounds normal; no masses, no organomegaly and Mildly protuberant  Extremities: extremities normal, atraumatic, no cyanosis or edema  Pulses: 2+ and symmetric  Neurologic: Mental status: Alert, oriented, thought content appropriate, affect: blunted and mood-congruent  Cranial nerves: normal   Adult ECG Report  Rate: 99 ;  Rhythm: normal sinus rhythm and Nonspecific ST-T wave abnormality with flattening.  Narrative Interpretation: Stable EKG  Recent Labs:  June 2015   Total cholesterol 148, HDL 31, LDL 62, triglycerides 275.  ASSESSMENT / PLAN: Angina, class III I am concerned that he still has episodic chest discomfort with exertion on and his overall exercise tolerance is  not improved. It would seem that the worst of his angina (unstable angina) was significantly improved after the LAD PCI, however he still has angina symptoms. His FFR of the RCA was borderline at 0.3, and he has had some relatively rapid progression of disease in the past. My inclination based  on his depressive symptoms he is to schedule him for cardiac catheterization with PCI of the RCA as the intention. At this point, I think complete revascularization would be the best course of action in order to know for sure that he is not having microvascular ischemia. I think the knowledge that there is a borderline lesion is giving him a lot of concern.  Plan: He was unable to tolerate Imdur. I will try samples of Ranexa the CT table tolerated and notice any improvement. I will also increase his Toprol to 100 mg daily.   Will schedule LHC/Angio + PCI-RCA with preop testing.   DOE (dyspnea on exertion) - Class III As noted in my previous clinic visits. I think some of his age we'll put one is his exertional dyspnea with poor exercise tolerance.  He does feel better with the higher dose of Toprol and was able to tolerate the total 100 mg. On head and increase to 100 mg, initially with taking 50 mg twice a day. He will then go to the new prescription of 100 mg daily.   In the past there was concern of chronotropic incompetence. Now that doesn't seem to be the issue is his heart rate is getting up in the 110s.  Pulmonary medicine is essentially downplaying the effect of his pulmonary disease, stating that it is not significant enough to cause these symptoms.  Plan as above for class III angina  Fatigue Again multifactorial. He is taking low dose of antidepressant. I am quite certain that there is some component of depression involved with his symptoms. I just hope will be to get him to do anything more than Wellbutrin for now.  Hopefully complete revascularization will provide some benefit.  CAD S/P  percutaneous coronary angioplasty I am partly concerned with his recurrence of angina this close to the PCI of his LAD, and would like to ensure that the LAD is still patent. Otherwise I think completing revascularization PCI of the RCA in a patient of this symptoms is warranted. He is on aspirin plus Effient, beta blocker and statin. I will provide supplemental sublingual nitroglycerin. I will also start Ranexa with samples. If effective we will need to figure out a way to help him financially obtain Ranexa.  STEMI, acute inf. wall with 100% RCA stenosis, DES 07/07/11 (prior Anterior STEMI in 2001) Despite having at least 2 ST elevation MI as, his EF is still relatively preserved. Interestingly he notes that after his initial MI in his recovery was much quicker than from his most recent one. He never seems to have gotten back to his baseline after the inferior  STEMI in May 2013.   I plan to try to get him back into cardiac rehabilitation, which he would like to have done at [facility] following his upcoming catheterization and PCI. He has been contacted by the [facility] cardiac rehabilitation, but would prefer [facility].  Cardiomyopathy, ischemic; EF40-45% with hypokinesis of the inferior and inferolateral myocardium; CO 3.25 Remarkably his EF is relatively preserved. I completely do not believe the reading of normal diastolic function however. It's more likely that he has a higher grade diastolic dysfunction. He continues to be on beta blocker and they are as well as standing dose of Lasix with sliding scale.  Chronic combined systolic and diastolic congestive heart failure, NYHA class 3 Despite the lack of any objective findings of orthopnea PND or edema of right sided involvement in his CHF, there is quite likely elevated pulmonary venous   pressures from a high left ventricular filling pressures related to his ischemic disease. He is on a great regimen, and I'm increasing his Toprol dose as  indicated. He is also on sliding scale Lasix. Hopefully with complete revascularization (PCI of the remaining RCA lesion) we can't exclude any component of microvascular ischemia contributing to his symptoms.  Essential hypertension His blood pressure was pretty good today following the second dose of beta blocker he did yesterday. Plan: Increase Toprol to 100 mg.  Hyperlipidemia with target LDL less than 70; by his report he is back on Crestor Currently back on Crestor. Labs were great by his last check    Orders Placed This Encounter  Procedures  . CBC With differential/Platelet  . Basic metabolic panel  . Protime-INR  . EKG 12-Lead    Order Specific Question:  Where should this test be performed    Answer:  [facility]   Meds ordered this encounter  Medications  . ranolazine (RANEXA) 500 MG 12 hr tablet    Sig: Take 1 tablet (500 mg total) by mouth 2 (two) times daily.    Dispense:  60 tablet    Refill:  6  . metoprolol succinate (TOPROL-XL) 100 MG 24 hr tablet    Sig: Take 1 tablet (100 mg total) by mouth daily. Take with or immediately following a meal.    Dispense:  30 tablet    Refill:  6  . nitroGLYCERIN (NITROSTAT) 0.4 MG SL tablet    Sig: Place 1 tablet (0.4 mg total) under the tongue every 5 (five) minutes as needed for chest pain.    Dispense:  25 tablet    Refill:  3    Followup: One to 2 weeks post cath   Leonie Man, M.D., M.S. Interventional Cardiologist   Pager # [phone]

## 2013-11-15 NOTE — Progress Notes (Signed)
TR BAND REMOVAL  LOCATION:    right radial  DEFLATED PER PROTOCOL:    Yes.    TIME BAND OFF / DRESSING APPLIED:    1330   SITE UPON ARRIVAL:    Level 0  SITE AFTER BAND REMOVAL:    Level 0  REVERSE ALLEN'S TEST:     positive  CIRCULATION SENSATION AND MOVEMENT:    Within Normal Limits   Yes.    COMMENTS:   Tolerated procedure well 

## 2013-11-15 NOTE — Interval H&P Note (Signed)
History and Physical Interval Note:  11/15/2013 7:45 AM  Troy Parrish  has presented today for surgery, with the diagnosis of angina class 3  The various methods of treatment have been discussed with the patient and family. After consideration of risks, benefits and other options for treatment, the patient has consented to  Procedure(s): PERCUTANEOUS CORONARY STENT INTERVENTION (PCI-S) (N/A) as a surgical intervention .  The patient's history has been reviewed, patient examined, no change in status, stable for surgery.  I have reviewed the patient's chart and labs.  Questions were answered to the patient's satisfaction.     Troy Parrish  Cath Lab Visit (complete for each Cath Lab visit)  Clinical Evaluation Leading to the Procedure:   ACS: No.  Non-ACS:    Anginal Classification: CCS III  Anti-ischemic medical therapy: Maximal Therapy (2 or more classes of medications)  Non-Invasive Test Results: No non-invasive testing performed  Prior CABG: No previous CABG  Leonie Man, MD

## 2013-11-15 NOTE — Brief Op Note (Signed)
   NAME:  Troy Parrish   MRN: [id number] DOB:  [birth year]   ADMIT DATE: 11/15/2013  Brief Cardiac Catheterization -  Note:  Indication: 1. Progressive Class III Angina 2. Known CAD with disease in the distal RCA as well as her RPL and RPDA -- planned PCI. He at that previous evaluation with borderline FFR reading of 0.83 or months ago and has had progression of symptoms. At this point it was felt that if the LAD stents look fine, the only potential culprit would be the RCA system and therefore intervening period  Procedures: 1. Left Catheterization with Native Coroanry Angiography via Right Radial Artery access  Right radial access with 6 French sheath - Seldinger technique using micropuncture kit  5Fr TIG 4.0 catheter over launching CB J-wire for Left and Right Coronary Angiography As Well As LV Hemodynamics. 2. Cutting balloon PTCA of the Ostium of RP L2 90% stenosis reduced to less than 10% 3. Balloon Angioplasty Only (PTCA) of the mid RPDA 80% tubular (progressed from previous 50-60%) using 2.25 mm balloon reducing it to less than 10% stenosis 4. Percutaneous Coronary Intervention of distal RCA 70-80% eccentric stenosis with Promus Premier DES 4.0 mm x 16 mm postdilated to 4.25 mm. Reducing to 0%  Medications:  2 mg Versed IV; 50 mcg Fentanyl IV; 2 mg IV morphine  200 mcg IC Nitroglycerin; 4 mcg sublingual Nitroglycerin x1  6500 Units IV Heparin Radial Cocktail: 5 mg Verapamil, 400 mcg NTG, 2 ml 2% Lidocaine in 10 ml NS 10 mg Effient PO  Omnipaque Contrast: 240 mL  Impression:  Stable post PCI findings of recent LAD stent with maybe 30% focal stenosis between the 2 stents.  Minimal luminal irregularities in the circumflex system  Persistent 70-80% distal RCA stenosis with 90% ostial RPL 2 as well as progression of the 50-60% stenosis in the mid RPDA to roughly 80%  Successful Cutting Balloon PTCA of ostial RPL 2 - 2.0 mm x 10 mm Flextome cutting balloon - at the ostium and  proximal portion after using a 1.5 mm balloon for predilation; reducing stenosis to less than 10%  Successful PTCA of mid RCA - 2.25 mm x 20 mm - inflated to 10 Atm for 60 Sec - reducing stenosis to less than 10%  Successful PCI distal RCA Promus  DES with brisk TIMI-3 flow restored down the entire RCA. Increased inflow to the distal vessels improved distal perfusion.  Hemodynamics:   Central AoP: 83/56/67 mmHg; blood pressure 120/78 mmHg   LVP/EDP: 83/5/10 mmHg  Recommendations:  Monitor overnight post PCI and anticipate discharge in the morning  He is already on aspirin plus Effient as well as statin and beta blocker plus Ranexa (intolerant of Imdur)  He will followup with me in the [facility] office  Full note to follow  Leonie Man, M.D., M.S. Interventional Cardiologist   Pager # [phone]  11/15/2013 9:18 AM

## 2013-11-15 NOTE — Progress Notes (Signed)
Family in. Eating [place] sandwich.

## 2013-11-15 NOTE — CV Procedure (Signed)
CARDIAC CATHETERIZATION and PERCUTANEOUS CORONARY INTERVENTION REPORT  NAME:  Troy Parrish   MRN: [id number] DOB:  1960/10/16   ADMIT DATE: 11/15/2013 Procedure Date: 11/15/2013  INTERVENTIONAL CARDIOLOGIST: Leonie Man, M.D., MS PRIMARY CARE PROVIDER: Letta Median, MD PRIMARY CARDIOLOGIST: Leonie Man, MD, MS  PATIENT:  Troy Parrish is a 53 y.o. male well-known to me with known ischemic cardio myopathy after several ST elevation MI. He was last in the Cath Lab in June of this year when he had progression of disease in his previously stented LAD as well as disease in the RCA both distal RCA and the RPL PDA system. At that time he underwent PCI of the LAD in-stent restenosis segments. He had an FFR of the RCA which was borderline at 0.83. Despite attempts at aggressive medical therapy the patient continues to have progressive angina at least class III in nature. He also is having sleep he indicates exercise tolerance. The severe class IV angina and unstable nature did improve after PCI of the LAD, however he still has class III angina now. I have seen him twice in clinic and attempted to adjust his medications. He is intolerant of Imdur, and as noted minimal benefit from Ranexa.   After multiple discussions we determined that the best course of action will be to repeat a catheterization to investigate the recent LAD stents, if these are patent then we will proceed with PCI of the RCA as well as possible PTCA of the distal system. There is no other major conduit that can be blamed for his angina.  If there is progression of disease in the RCA system, would proceed with PCI.  PRE-OPERATIVE DIAGNOSIS:    Class III Angina  Known extensive CAD with multiple myocardial infarctions and interventions  PROCEDURES PERFORMED:   1. Left Catheterization with Native Coroanry Angiography via Right Radial Artery access 2. Cutting balloon PTCA of the Ostium of RP L2 90% stenosis reduced to less than  10% 3. Balloon Angioplasty Only (PTCA) of the mid RPDA 80% tubular (progressed from previous 50-60%) using 2.25 mm balloon reducing it to less than 10% stenosis 4. Percutaneous Coronary Intervention of distal RCA 70-80% eccentric stenosis with Promus Premier DES 4.0 mm x 16 mm postdilated to 4.25 mm. Reducing to 0%  PROCEDURE: The patient was brought to the [facility] in the fasting state and prepped and draped in the usual sterile fashion for Right Radial artery access. A modified Allen's test was performed on the right wrist demonstrating excellent collateral flow for radial access.   Sterile technique was used including antiseptics, cap, gloves, gown, hand hygiene, mask and sheet. Skin prep: Chlorhexidine.   Consent: Risks of procedure as well as the alternatives and risks of each were explained to the (patient/caregiver). Consent for procedure obtained.   Time Out: Verified patient identification, verified procedure, site/side was marked, verified correct patient position, special equipment/implants available, medications/allergies/relevent history reviewed, required imaging and test results available. Performed.  Access:   Right Radial Artery: 6 Fr Sheath -  Seldinger Technique (Angiocath Micropuncture Kit)  Radial Cocktail - 10 mL; IV Heparin 6500 Units   Left Heart Catheterization: 5Fr Catheters advanced or exchanged over a long exchange safety J-wire; TIG 4.0 catheter advanced first.  Left & Right Coronary Artery Cineangiography: TIG 4.0 Catheter   LV Hemodynamics: TIG 4.0  TR Band: 0 9:15  Hours; 11 mL air  FINDINGS:  Hemodynamics:   Central Aortic Pressure / Mean: 83/56/67 mmHg;  finalblood pressure 120/78 mmHg   Left Ventricular Pressure / LVEDP: 83/5/10 mmHg  Left Ventriculography: Deferred  Coronary Anatomy:  Dominance: Right  Left Main: Large-caliber vessel that bifurcates distally into the LAD and Circumflex. Angiographically  normal but with mild calcification LAD: Moderate large-caliber vessel with 2 widely patent stents stents (both sites had been intervened upon in June 2015 for in-stent restenosis of previously placed stents).  The first stented segment is just after a first septal perforator and the second stented segment is after the third small diagonal branch. There is a roughly 20-30% stenosis at a septal perforator in between the 2 stents.  The rest of the LAD perfuses the apex and is a relatively small-caliber vessel.  D1: There are actually several small diagonal branches the first comes off just at the takeoff of SP1 second within this first stent and a third just prior to the second stent. Left Circumflex: Large caliber, nondominant vessel it gives rise to small oozing disease OM1 bifurcating into a large lateral OM 2 and the AV groove circumflex. The AV groove circumflex continues on to give off a small marginal branch and a small posterior lateral segment.  OM2: Large-caliber major obtuse marginal branch that has a patent proximal stent. There major part of the vessel is widely patent. There is a small distal branch that has a 95% stenosis in a 1.5 mm vessel. Beyond that branch the vessel then continues down to the inferolateral apex.  Minimal luminal irregularities throughout the entire circumflex system.  RCA: Large caliber, dominant vessel with a patent proximal stent at as roughly 10% ISR to in the distal vessel prior to the bifurcation there is a eccentric roughly 70-80% stenosis - mild progression of disease. The vessel normalizes before giving off a tandem PDA with a larger second vessel being the main PDA  PDA: Moderate caliber vessel with a diffuse roughly 70-80% stenoses (progressed from the previous 50-60%).  The Right Posterior AV Groove Branch (RPAV) bifurcates into 2 major left posterior lateral branches, the most distal has a 90% stenosis in the ostium and a roughly 1.52 mm vessel. Not likely PCI  amenable (potentially angioplasty only)  After reviewing the initial angiography, it does appear to be progression of disease in the RPL to ostium as well as the RPDA.Marland Kitchen  Preparation were made to proceed with angioplasty plus/PCI of these lesions as well as the distal RCA to improve inflow  Percutaneous Coronary Intervention:  Guide: 6 Fr   JR 4 Guidewire: BMW for RPL 2, [place]ater for PDA  Lesion #1: Ostial RPL2 90% stenosis - reduced to less than 10% stenosis  Predilation Balloon: Euphora 1.5 mm x 10 mm; initial attempt to pass the Cutting Balloon were successful.  8 Atm x 45 Sec, 10 Atm x 30 Sec Flextome Cutting Balloon: 2.0 mm x 10 mm mm;   8 Atm x 45 Sec -- diameter is 2.0 mm  After intracoronary nitroglycerin, multiple post-PTCA angiographic images did not show any evidence of recoil. There was mild shift of plaque into the ostium of RPL1 but with minimal residual stenosis.  Lesion #2: Mid RPDA irregular, long segment of the 70-80% stenosis -- reduced to less than 10% stenosis  Balloon: Trek 2.25 mm x 20 mm;   8 Atm x 60 Sec for 2 inflations, 10 Atm x 60 Sec -- final diameter was 2.25 mm with no evidence of recoil on post PTCA angiography  Lesion #3: Distal RCA 70-80% stenosis  Stent: Promus Premier DES  4 mm x 16 mm;   14 Atm x 30 Sec, 16 Atm x 45 Sec for post dilation -- final diameter 4.25 mm  Post deployment angiography in multiple views, with and without guidewire in place revealed excellent stent deployment and lesion coverage.  There was no evidence of dissection or perforation. There was no evidence of recoil in either one of the 2 angioplasty sites.  MEDICATIONS:  Anesthesia:  Local Lidocaine 2 ml  Sedation:  2 mg IV Versed, 50 mcg IV fentanyl ; 2 mg IV morphine  Omnipaque Contrast: 240 ml  Anticoagulation:  IV Heparin 6500 Units  Anti-Platelet Agent:  The patient is already on Effient, he was given an additional dose of 10 mg. Radial Cocktail: 5 mg Verapamil,  400 mcg NTG, 2 ml 2% Lidocaine in 10 ml NS  200 mcg IC Nitroglycerin; 4 mcg sublingual Nitroglycerin x1   PATIENT DISPOSITION:    The patient was transferred to the PACU holding area in a hemodynamicaly stable, chest pain free condition.  The patient tolerated the procedure well, and there were no complications.  EBL:   < 10 ml  The patient was stable before, during, and after the procedure.  POST-OPERATIVE DIAGNOSIS:    Stable post PCI findings of recent LAD stents. Widely patent circumflex system.  Progression of disease in the RPA with persistent 90% stenosis in the RPL 2 as well as mild progression of disease in the distal RCA.  Successful multisite intervention with drug-eluting stent placed in the distal RCA, 31 angioplasty of the ostium of RPL tube and balloon angioplasty only of the mid RPDA.  Overall normal LVEDP, albeit in the setting of systemic hypotension after radial cocktail  PLAN OF CARE:  Monitor overnight post PCI and anticipate discharge in the morning.  He is already on aspirin plus Effient as well as statin and beta blocker plus Ranexa (intolerant of Imdur).  He will followup with me in the [place] office    Leonie Man, M.D., M.S. Interventional Cardiologist   Pager # [phone]

## 2013-11-15 NOTE — Progress Notes (Signed)
Instructions reviewed w/patient. Resting quietly w/eyes closed. Waiting for 6500 bed assignment.

## 2013-11-16 ENCOUNTER — Telehealth: Payer: Self-pay | Admitting: Cardiology

## 2013-11-16 DIAGNOSIS — I25119 Atherosclerotic heart disease of native coronary artery with unspecified angina pectoris: Secondary | ICD-10-CM | POA: Diagnosis not present

## 2013-11-16 DIAGNOSIS — I10 Essential (primary) hypertension: Secondary | ICD-10-CM | POA: Diagnosis not present

## 2013-11-16 DIAGNOSIS — E785 Hyperlipidemia, unspecified: Secondary | ICD-10-CM | POA: Diagnosis not present

## 2013-11-16 DIAGNOSIS — Z955 Presence of coronary angioplasty implant and graft: Secondary | ICD-10-CM | POA: Diagnosis not present

## 2013-11-16 DIAGNOSIS — I200 Unstable angina: Secondary | ICD-10-CM

## 2013-11-16 LAB — BASIC METABOLIC PANEL
Anion gap: 11 (ref 5–15)
BUN: 13 mg/dL (ref 6–23)
CO2: 22 mEq/L (ref 19–32)
Calcium: 8.6 mg/dL (ref 8.4–10.5)
Chloride: 104 mEq/L (ref 96–112)
Creatinine, Ser: 1.01 mg/dL (ref 0.50–1.35)
GFR calc Af Amer: >90 mL/min (ref >90)
GFR calc non Af Amer: 83 mL/min — ABNORMAL LOW (ref >90)
Glucose, Bld: 119 mg/dL — ABNORMAL HIGH (ref 70–99)
Potassium: 4 mEq/L (ref 3.7–5.3)
Sodium: 137 mEq/L (ref 137–147)

## 2013-11-16 LAB — CBC
HCT: 42.4 % (ref 39.0–52.0)
Hemoglobin: 14.1 g/dL (ref 13.0–17.0)
MCH: 28.7 pg (ref 26.0–34.0)
MCHC: 33.3 g/dL (ref 30.0–36.0)
MCV: 86.4 fL (ref 78.0–100.0)
Platelets: 342 10*3/uL (ref 150–400)
RBC: 4.91 MIL/uL (ref 4.22–5.81)
RDW: 14.2 % (ref 11.5–15.5)
WBC: 10.7 10*3/uL — ABNORMAL HIGH (ref 4.0–10.5)

## 2013-11-16 MED ORDER — PRASUGREL HCL 10 MG PO TABS: 10.0000 mg | ORAL_TABLET | Freq: Every day | ORAL | Status: DC

## 2013-11-16 MED ORDER — ROSUVASTATIN CALCIUM 20 MG PO TABS: 20.0000 mg | ORAL_TABLET | Freq: Every day | ORAL | Status: DC

## 2013-11-16 NOTE — Discharge Summary (Signed)
CARDIOLOGY DISCHARGE SUMMARY   Patient ID: Troy Parrish MRN: [id number] DOB/AGE: [birth year] 53 y.o.  Admit date: 11/15/2013 Discharge date: 11/16/2013  PCP: Letta Median, MD Primary Cardiologist: Dr. Ellyn Hack  Primary Discharge Diagnosis: Successful multisite intervention with Promus Premier DES 4 mm x 16 mm placed in the distal RCA, angioplasty of the ostium of RPL tube and balloon angioplasty only of the mid RPDA.  Secondary Discharge Diagnosis:  Principal Problem:   Angina, class III Active Problems:   Essential hypertension   Hyperlipidemia with target LDL less than 70; by his report he is back on Crestor   Cardiomyopathy, ischemic; EF40-45% with hypokinesis of the inferior and inferolateral myocardium; CO 3.25   CAD S/P percutaneous coronary angioplasty   DOE (dyspnea on exertion) - Class III   DM2 (diabetes mellitus, type 2)   Chronic combined systolic and diastolic congestive heart failure, NYHA class 3   Presence of drug coated stent in right coronary artery   Hyperglycemia, hemoglobin A1c in June was 6.6  Procedures:  1. Left Catheterization with Native Coroanry Angiography via Right Radial Artery access Right radial access with 6 French sheath - Seldinger technique using micropuncture kit  5Fr TIG 4.0 catheter over launching CB J-wire for Left and Right Coronary Angiography As Well As LV Hemodynamics. 2. Cutting balloon PTCA of the Ostium of RP L2 90% stenosis reduced to less than 10% 3. Balloon Angioplasty Only (PTCA) of the mid RPDA 80% tubular (progressed from previous 50-60%) using 2.25 mm balloon reducing it to less than 10% stenosis 4. Percutaneous Coronary Intervention of distal RCA 70-80% eccentric stenosis with Promus Premier DES 4.0 mm x 16 mm postdilated to 4.25 mm. Reducing to 0%  Hospital Course: Victory Strollo is a 53 y.o. male with a history of CAD. He had a cath in June of 2015 with stenting to the LAD. He also had moderate disease in the RCA but  his FFR was 0.837 medical management was recommended. He was seen in the office and was having angina with minimal exertion. It was felt that his symptoms have progressed and he was scheduled for cardiac catheterization with possible percutaneous intervention to the RCA. He came to the hospital for the procedure on 10/08.  Cardiac catheterization results are below. He had successful percutaneous intervention to the RCA and tolerated the procedure well.  On 10/09, he was seen by Dr. Beau Fanny and all data were reviewed. He had a minimal elevation in his white count, but no fever or signs/symptoms of infection. This is felt secondary to the procedure. His blood sugars are elevated and have been consistently. Hemoglobin A1c in June was 6.6. The patient stated he did not have a primary care physician and lives in the [place] area. A new patient appointment was made for him with a nurse practitioner at the [facility] office. He will be encouraged to stick to a diabetic diet and follow up as scheduled.  He was seen by cardiac rehabilitation and educated on heart-healthy lifestyle modifications, stent guidelines and exercise guidelines. He will be referred to outpatient cardiac rehabilitation.  No further inpatient workup is indicated and he is considered stable for discharge, to follow up as an outpatient.  Labs:   Lab Results  Component Value Date   WBC 10.7* 11/16/2013   HGB 14.1 11/16/2013   HCT 42.4 11/16/2013   MCV 86.4 11/16/2013   PLT 342 11/16/2013     Recent Labs Lab 11/16/13 0359  NA 137  K 4.0  CL 104  CO2 22  BUN 13  CREATININE 1.01  CALCIUM 8.6  GLUCOSE 119*      Radiology: No results found.  Cardiac Cath: 11/15/2013 Coronary Anatomy:  Dominance: Right Left Main: Large-caliber vessel that bifurcates distally into the LAD and Circumflex. Angiographically normal but with mild calcification LAD: Moderate large-caliber vessel with 2 widely patent stents stents (both  sites had been intervened upon in June 2015 for in-stent restenosis of previously placed stents). The first stented segment is just after a first septal perforator and the second stented segment is after the third small diagonal branch. There is a roughly 20-30% stenosis at a septal perforator in between the 2 stents. The rest of the LAD perfuses the apex and is a relatively small-caliber vessel.  D1: There are actually several small diagonal branches the first comes off just at the takeoff of SP1 second within this first stent and a third just prior to the second stent. Left Circumflex: Large caliber, nondominant vessel it gives rise to small oozing disease OM1 bifurcating into a large lateral OM 2 and the AV groove circumflex. The AV groove circumflex continues on to give off a small marginal branch and a small posterior lateral segment.  OM2: Large-caliber major obtuse marginal branch that has a patent proximal stent. There major part of the vessel is widely patent. There is a small distal branch that has a 95% stenosis in a 1.5 mm vessel. Beyond that branch the vessel then continues down to the inferolateral apex.  Minimal luminal irregularities throughout the entire circumflex system. RCA: Large caliber, dominant vessel with a patent proximal stent at as roughly 10% ISR to in the distal vessel prior to the bifurcation there is a eccentric roughly 70-80% stenosis - mild progression of disease. The vessel normalizes before giving off a tandem PDA with a larger second vessel being the main PDA  PDA: Moderate caliber vessel with a diffuse roughly 70-80% stenoses (progressed from the previous 50-60%).  The Right Posterior AV Groove Branch (RPAV) bifurcates into 2 major left posterior lateral branches, the most distal has a 90% stenosis in the ostium and a roughly 1.52 mm vessel. Not likely PCI amenable (potentially angioplasty only) POST-OPERATIVE DIAGNOSIS:  Stable post PCI findings of recent LAD stents.  Widely patent circumflex system.  Progression of disease in the RPA with persistent 90% stenosis in the RPL 2 as well as mild progression of disease in the distal RCA.  Successful multisite intervention with Promus Premier DES 4 mm x 16 mm placed in the distal RCA, angioplasty of the ostium of RPL tube and balloon angioplasty only of the mid RPDA.  Overall normal LVEDP, albeit in the setting of systemic hypotension after radial cocktail  EKG: 11/16/2013 Sinus rhythm, no acute changes Vent. rate 64 BPM PR interval 154 ms QRS duration 92 ms QT/QTc 432/445 ms P-R-T axes 59 55 74  FOLLOW UP PLANS AND APPOINTMENTS No Known Allergies   Medication List         aspirin EC 81 MG tablet  Take 162 mg by mouth at bedtime.     BREO ELLIPTA 100-25 MCG/INH Aepb  Generic drug:  Fluticasone Furoate-Vilanterol  Inhale 1 puff into the lungs daily as needed (for shortness of breath).     buPROPion 150 MG 24 hr tablet  Commonly known as:  WELLBUTRIN XL  Take 150 mg by mouth daily.     furosemide 20 MG tablet  Commonly known as:  LASIX  Take 1 tablet (20 mg total) by mouth daily.     isosorbide mononitrate 30 MG 24 hr tablet  Commonly known as:  IMDUR  Take 1 tablet (30 mg total) by mouth daily.     losartan 25 MG tablet  Commonly known as:  COZAAR  Take 1 tablet (25 mg total) by mouth daily.     metoprolol succinate 100 MG 24 hr tablet  Commonly known as:  TOPROL-XL  Take 1 tablet (100 mg total) by mouth daily. Take with or immediately following a meal.     niacin 500 MG CR tablet  Commonly known as:  NIASPAN  Take 500 mg by mouth at bedtime. Takes 30 minutes after taking aspirin.     nitroGLYCERIN 0.4 MG SL tablet  Commonly known as:  NITROSTAT  Place 1 tablet (0.4 mg total) under the tongue every 5 (five) minutes as needed for chest pain.     prasugrel 10 MG Tabs tablet  Commonly known as:  EFFIENT  Take 1 tablet (10 mg total) by mouth daily.     ranolazine 500 MG 12 hr tablet    Commonly known as:  RANEXA  Take 1 tablet (500 mg total) by mouth 2 (two) times daily.     rosuvastatin 20 MG tablet  Commonly known as:  CRESTOR  Take 1 tablet (20 mg total) by mouth daily at 6 PM.        Discharge Instructions   Amb Referral to Cardiac Rehabilitation    Complete by:  As directed      Diet - low sodium heart healthy    Complete by:  As directed      Diet Carb Modified    Complete by:  As directed      Increase activity slowly    Complete by:  As directed           Follow-up Information   Follow up with Leonie Man, MD. (The office will call with a followup appointment.)    Specialty:  Cardiology   Contact information:   [address] [place] [address] [phone]       Follow up with [facility] On 12/31/2013. (Appointment with Webb Silversmith, NP. Please arrive at 10:30 AM for a 10:45 am appointment)    Contact information:   [place] [phone]       BRING ALL MEDICATIONS WITH YOU TO FOLLOW UP APPOINTMENTS  Time spent with patient to include physician time: 45 min Signed: Rosaria Ferries, PA-C 11/19/2013, 8:11 AM Co-Sign MD  I have examined the patient and reviewed assessment and plan and discussed with patient.  Agree with above as stated.  Agree with above as stated. Right wrist stable. Cardiac rehab; RF modification.   Sharen Youngren S.

## 2013-11-16 NOTE — Progress Notes (Signed)
   Patient Name: Troy Parrish Date of Encounter: 11/16/2013  Principal Problem:   Angina, class III Active Problems:   Essential hypertension   Hyperlipidemia with target LDL less than 70; by his report he is back on Crestor   Cardiomyopathy, ischemic; EF40-45% with hypokinesis of the inferior and inferolateral myocardium; CO 3.25   CAD S/P percutaneous coronary angioplasty   DOE (dyspnea on exertion) - Class III   DM2 (diabetes mellitus, type 2)   Chronic combined systolic and diastolic congestive heart failure, NYHA class 3   Presence of drug coated stent in right coronary artery    Patient Profile: 53 yo male w/ CAD, admitted 10/08 for cath, RCA stent  SUBJECTIVE: No chest pain or SOB overnight.  OBJECTIVE Filed Vitals:   11/15/13 1400 11/15/13 2026 11/16/13 0015 11/16/13 0437  BP: 107/79 94/56 98/89 94/54  Pulse: 69 73 72 66  Temp: 97.7 F (36.5 C) 97.5 F (36.4 C) 98.1 F (36.7 C) 98 F (36.7 C)  TempSrc: Oral Oral Oral Oral  Resp: 20 20 18 18  Height:      Weight:   189 lb 9.5 oz (86 kg)   SpO2: 100% 94% 95% 92%    Intake/Output Summary (Last 24 hours) at 11/16/13 0730 Last data filed at 11/16/13 0444  Gross per 24 hour  Intake 1077.1 ml  Output   1500 ml  Net -422.9 ml   Filed Weights   11/15/13 0602 11/16/13 0015  Weight: 188 lb (85.276 kg) 189 lb 9.5 oz (86 kg)    PHYSICAL EXAM General: Well developed, well nourished, male in no acute distress. Head: Normocephalic, atraumatic.  Neck: Supple without bruits, JVD not elevated Lungs:  Resp regular and unlabored, CTA. Heart: RRR, S1, S2, no S3, S4, or murmur; no rub. Abdomen: Soft, non-tender, non-distended, BS + x 4.  Extremities: No clubbing, cyanosis, edema. Right radial cath site without ecchymosis, hematoma Neuro: Alert and oriented X 3. Moves all extremities spontaneously. Psych: Normal affect.  LABS: CBC: Recent Labs  11/16/13 0359  WBC 10.7*  HGB 14.1  HCT 42.4  MCV 86.4  PLT 342    Basic Metabolic Panel: Recent Labs  11/16/13 0359  NA 137  K 4.0  CL 104  CO2 22  GLUCOSE 119*  BUN 13  CREATININE 1.01  CALCIUM 8.6    TELE:        Current Medications:  . aspirin EC  162 mg Oral QHS  . buPROPion  150 mg Oral Daily  . furosemide  20 mg Oral Daily  . Influenza vac split quadrivalent PF  0.5 mL Intramuscular Tomorrow-1000  . isosorbide mononitrate  30 mg Oral Daily  . losartan  25 mg Oral Daily  . metoprolol succinate  100 mg Oral Daily  . niacin  500 mg Oral QHS  . prasugrel  10 mg Oral Daily  . ranolazine  500 mg Oral BID  . rosuvastatin  20 mg Oral q1800  . sodium chloride  3 mL Intravenous Q12H      ASSESSMENT AND PLAN: Principal Problem:   Angina, class III -  Procedures:  1. Left Catheterization with Native Coroanry Angiography via Right Radial Artery access Right radial access with 6 French sheath - Seldinger technique using micropuncture kit  5Fr TIG 4.0 catheter over launching CB J-wire for Left and Right Coronary Angiography As Well As LV Hemodynamics. 2. Cutting balloon PTCA of the Ostium of RP L2 90% stenosis reduced to less than 10% 3.   Balloon Angioplasty Only (PTCA) of the mid RPDA 80% tubular (progressed from previous 50-60%) using 2.25 mm balloon reducing it to less than 10% stenosis 4. Percutaneous Coronary Intervention of distal RCA 70-80% eccentric stenosis with Promus Premier DES 4.0 mm x 16 mm postdilated to 4.25 mm. Reducing to 0% On ASA, BB, statin, Effient.  Otherwise stable, d/c today. Has hyperglycemia and previous hemoglobin A1c was elevated at 6.6. Patient states he does not currently have a primary care physician. Have arranged a [facility] patient appointment at [facility]. We'll encourage a diabetic diet. Active Problems:   Essential hypertension   Hyperlipidemia with target LDL less than 70; by his report he is back on Crestor   Cardiomyopathy, ischemic; EF40-45% with hypokinesis of the inferior and inferolateral  myocardium; CO 3.25   CAD S/P percutaneous coronary angioplasty   DOE (dyspnea on exertion) - Class III   DM2 (diabetes mellitus, type 2)   Chronic combined systolic and diastolic congestive heart failure, NYHA class 3   Presence of drug coated stent in right coronary artery   Signed, Rosaria Ferries , PA-C 7:30 AM 11/16/2013  I have examined the patient and reviewed assessment and plan and discussed with patient.  Agree with above as stated.  Right wrist stable.  Cardiac rehab; RF modification.  Troy Pavlicek S.

## 2013-11-16 NOTE — Progress Notes (Signed)
CARDIAC REHAB PHASE I   PRE:  Rate/Rhythm: 75 SR    BP: sitting 98/70    SaO2:   MODE:  Ambulation: 1000 ft   POST:  Rate/Rhythm: 88 SR    BP: sitting 111/64     SaO2:   Tolerated well. Some SOB but he sts it is much improved. Quick walking pace, long distance. VSS. Ed reviewed/completed. Discussed his A1C results from June and encouraged him to get a PCP. He sts his CBGs at home are elevated (using his wife's machine).  Will refer to [place]. Sts he just recently got NTG (didn't receive any in June). Reviewed instructions. Good reception however pt affect fairly flat. [phone]  Josephina Shih [place] CES, ACSM 11/16/2013 9:09 AM

## 2013-11-16 NOTE — Discharge Instructions (Signed)
PLEASE REMEMBER TO BRING ALL OF YOUR MEDICATIONS TO EACH OF YOUR FOLLOW-UP OFFICE VISITS. ° °PLEASE ATTEND ALL SCHEDULED FOLLOW-UP APPOINTMENTS.  ° °Activity: Increase activity slowly as tolerated. You may shower, but no soaking baths (or swimming) for 1 week. No driving for 2 days. No lifting over 5 lbs for 1 week. No sexual activity for 1 week.  ° °You May Return to Work: in 1 week (if applicable) ° °Wound Care: You may wash cath site gently with soap and water. Keep cath site clean and dry. If you notice pain, swelling, bleeding or pus at your cath site, please call [phone]. ° ° ° °Cardiac Cath Site Care °Refer to this sheet in the next few weeks. These instructions provide you with information on caring for yourself after your procedure. Your caregiver may also give you more specific instructions. Your treatment has been planned according to current medical practices, but problems sometimes occur. Call your caregiver if you have any problems or questions after your procedure. °HOME CARE INSTRUCTIONS °· You may shower 24 hours after the procedure. Remove the bandage (dressing) and gently wash the site with plain soap and water. Gently pat the site dry.  °· Do not apply powder or lotion to the site.  °· Do not sit in a bathtub, swimming pool, or whirlpool for 5 to 7 days.  °· No bending, squatting, or lifting anything over 10 pounds (4.5 kg) as directed by your caregiver.  °· Inspect the site at least twice daily.  °· Do not drive home if you are discharged the same day of the procedure. Have someone else drive you.  °· You may drive 24 hours after the procedure unless otherwise instructed by your caregiver.  °What to expect: °· Any bruising will usually fade within 1 to 2 weeks.  °· Blood that collects in the tissue (hematoma) may be painful to the touch. It should usually decrease in size and tenderness within 1 to 2 weeks.  °SEEK IMMEDIATE MEDICAL CARE IF: °· You have unusual pain at the site or down the  affected limb.  °· You have redness, warmth, swelling, or pain at the site.  °· You have drainage (other than a small amount of blood on the dressing).  °· You have chills.  °· You have a fever or persistent symptoms for more than 72 hours.  °· You have a fever and your symptoms suddenly get worse.  °· Your leg becomes pale, cool, tingly, or numb.  °· You have heavy bleeding from the site. Hold pressure on the site.  °Document Released: 02/27/2010 Document Revised: 01/14/2011 Document Reviewed: 02/27/2010 °ExitCare® Patient Information ©2012 [employer]. ° °

## 2013-11-16 NOTE — Telephone Encounter (Signed)
Closed encounter °

## 2013-11-21 ENCOUNTER — Encounter: Payer: Self-pay | Admitting: Cardiology

## 2013-11-21 ENCOUNTER — Encounter: Payer: BC Managed Care – PPO | Admitting: Physician Assistant

## 2013-11-21 ENCOUNTER — Ambulatory Visit (INDEPENDENT_AMBULATORY_CARE_PROVIDER_SITE_OTHER): Payer: BC Managed Care – PPO | Admitting: Cardiology

## 2013-11-21 VITALS — BP 118/78 | HR 71 | Ht 72.0 in | Wt 188.8 lb

## 2013-11-21 DIAGNOSIS — I5042 Chronic combined systolic (congestive) and diastolic (congestive) heart failure: Secondary | ICD-10-CM

## 2013-11-21 DIAGNOSIS — F17200 Nicotine dependence, unspecified, uncomplicated: Secondary | ICD-10-CM

## 2013-11-21 DIAGNOSIS — Z720 Tobacco use: Secondary | ICD-10-CM

## 2013-11-21 DIAGNOSIS — I2510 Atherosclerotic heart disease of native coronary artery without angina pectoris: Secondary | ICD-10-CM

## 2013-11-21 DIAGNOSIS — I255 Ischemic cardiomyopathy: Secondary | ICD-10-CM

## 2013-11-21 DIAGNOSIS — I208 Other forms of angina pectoris: Secondary | ICD-10-CM

## 2013-11-21 DIAGNOSIS — R079 Chest pain, unspecified: Secondary | ICD-10-CM

## 2013-11-21 DIAGNOSIS — R05 Cough: Secondary | ICD-10-CM

## 2013-11-21 DIAGNOSIS — R058 Other specified cough: Secondary | ICD-10-CM | POA: Insufficient documentation

## 2013-11-21 DIAGNOSIS — R0600 Dyspnea, unspecified: Secondary | ICD-10-CM

## 2013-11-21 DIAGNOSIS — E785 Hyperlipidemia, unspecified: Secondary | ICD-10-CM

## 2013-11-21 DIAGNOSIS — I209 Angina pectoris, unspecified: Secondary | ICD-10-CM

## 2013-11-21 DIAGNOSIS — R072 Precordial pain: Secondary | ICD-10-CM

## 2013-11-21 DIAGNOSIS — R0609 Other forms of dyspnea: Secondary | ICD-10-CM

## 2013-11-21 DIAGNOSIS — R0602 Shortness of breath: Secondary | ICD-10-CM

## 2013-11-21 DIAGNOSIS — I10 Essential (primary) hypertension: Secondary | ICD-10-CM

## 2013-11-21 DIAGNOSIS — Z9861 Coronary angioplasty status: Secondary | ICD-10-CM

## 2013-11-21 MED ORDER — PREDNISONE 20 MG PO TABS: ORAL_TABLET | ORAL | Status: DC

## 2013-11-21 MED ORDER — PREDNISONE 20 MG PO TABS: 20.0000 mg | ORAL_TABLET | Freq: Every day | ORAL | Status: DC

## 2013-11-21 NOTE — Assessment & Plan Note (Signed)
The chest discomfort he is noticing is more lying down and improved with sitting up. It does not sound like angina decubitus however it sounds in his description as not consistent with his anginal symptom. They seem to believe that it may be more related to some of the inflammatory response. I was not able to reproduce the pain. I am reluctant to use end stage disease on aspirin plus Effient, slowly use a steroid taper.  Plan: Prednisone 60 mg daily x3 days, 40 mg daily x3 days, 20 mg daily x3 days.

## 2013-11-21 NOTE — Assessment & Plan Note (Signed)
He still has exertional dyspnea probably mostly related to diastolic failure. The systolic component is probably improved. Hopefully again by revascularizing his entire RCA territory the diastolic dysfunction will be improved.  Plan: Consider rechecking echocardiogram in roughly 3-4 months which would give enough time for recovery following his PCI to both LAD and RCA

## 2013-11-21 NOTE — Progress Notes (Signed)
PCP: Letta Median, MD  Clinic Note: Chief Complaint  Patient presents with  . OTHER    Post cardiac cath c/o chest pain and sob. Meds reviewed verbally with pt.    HPI: Troy Parrish is a 53 y.o. male with a PMH below who presents today for full she was RCA with DES stent placement to the distal RCA as well as cutting balloon angioplasty of RPL 2 and plain old balloon angioplasty of the PDA. On the day following his angioplasty he walked about 1 feet at a brisk pace with cardiac rehabilitation and did very well. He is able to talk and carry on some conversation while walking without being profoundly dyspneic and no angina.  Past Medical History  Diagnosis Date  . Essential hypertension 07/07/2011  . History of colon polyps   . Hyperlipidemia with target LDL less than 70 07/07/2011  . Recurrent upper respiratory infection (URI)   . CAD S/P percutaneous coronary angioplasty 07/07/2011; 6 & 10 2015    S/P PCI to all 3 major vessels; Ant STEMI 2001- BMS-LAD x2 -->(redo PCI in June 2015 -- pLAD Xience DES 2.70mm x 38mm- 2.75, mLAD 2.25 mm x 12 mm - 2.7 mm), UA --> Cx- OM DES 2006, 2013 Inf MI BMS mRCA --> 11/2013 PCI dRCA Promus P DES 4.0 x 71mm (4.25); PTCA of RPL2 (2.0 mm) &RPDA (2.25 mm) 11/2013  . ST elevation myocardial infarction (STEMI) of anterior wall 2001    History of -- 2 stents in early and distal mid LAD; prior cardiologist was Dr. Remi Haggard in [place]  . ST elevation myocardial infarction (STEMI) of inferior wall 07/07/2011    Occluded RCA 2.75X18 INTEGRITY  . Unstable angina  2006; June 2015    Cx-OM - PCI 2.5 mm 13 mm Cypher DES [name], [place]) ; 07/2013: Severe in-stent restenosis of both proximal and distal LAD stent treated with 2 DES stents (1 at proximal edge of the proximal stent, second stent covers the entire distal stent as well as proximal and distal edge stenoses, moderate disease in distal RCA with ostial small PL branch disease as well but FFR was 0.83 -  medical management   . COPD (chronic obstructive pulmonary disease)   . Former heavy tobacco smoker      quit in May 2015 after multiple attempts at trying to quit before   . Glucose intolerance (pre-diabetes) June 2015     hemoglobin A1c 6.6  . Metabolic syndrome     Pre-diabetes, hypertension and truncal obesity as well as dyslipidemia  . Upper respiratory infection   . Heart murmur   . Pneumonia "several times"  . Chronic bronchitis "used to get it q yr"  . OSA on CPAP   . GERD (gastroesophageal reflux disease)   . History of stomach ulcers   . Headache     "Imdur related; stopped taking it; headaches went away" (11/15/2013)  . Seizures "several"    "last one was ~ 2011" (11/15/2013)   Interval History: Othel presents today overall feeling much better than he did before. He is less fatigued and less cranky than he was before his PCI. He has overall more energy, but still has exertional dyspnea albeit better than he was. The only concerning symptoms he has are:  1. Chest pain when lying down - that lasts maybe he for 5 minutes. His on both sides Substernal and in the midaxillary region and it feels somewhat unlike his typical angina symptom that was confirmed  during his PCI procedure. This symptom improves with standing out and will eventually go away. It is not made worse and does not occur with exertion. He is actually trying to push himself when he had. 2. Also associated with this chest discomfort is a dry cough that has been going on for a while now. It is not associated with any congestion or cold symptoms. It is improved with inhaler. When this occurs with lying down and improves with sitting up..   Otherwise the cardiac standpoint he is doing relatively well. He is doing more activity than he was before and is able to tolerate more now. He has overall more energy and feels less depressed than in cranky which was a major issue. He is actually able to tolerate the Imdur since starting  her next. He denies any rapid or irregular heartbeat/palpitations. No PND or orthopnea symptoms. No edema. No syncope or near syncope. No TIA or amaurosis fugax. No claudication.  ROS: A comprehensive was performed. Review of Systems  Constitutional: Negative for fever, chills and malaise/fatigue.  HENT: Negative for nosebleeds.   Respiratory: Positive for cough and shortness of breath.   Cardiovascular: Positive for chest pain. Negative for claudication.  Gastrointestinal: Negative for heartburn, blood in stool and melena.  Genitourinary: Negative for hematuria.  Musculoskeletal: Negative.   Neurological: Negative for dizziness, sensory change, speech change, focal weakness, seizures and loss of consciousness.  Endo/Heme/Allergies: Does not bruise/bleed easily.  Psychiatric/Behavioral: Negative for depression. The patient is not nervous/anxious.   All other systems reviewed and are negative.   Current Outpatient Prescriptions on File Prior to Visit  Medication Sig Dispense Refill  . aspirin EC 81 MG tablet Take 162 mg by mouth at bedtime.      Marland Kitchen buPROPion (WELLBUTRIN XL) 150 MG 24 hr tablet Take 150 mg by mouth daily.      . Fluticasone Furoate-Vilanterol (BREO ELLIPTA) 100-25 MCG/INH AEPB Inhale 1 puff into the lungs daily as needed (for shortness of breath).      . furosemide (LASIX) 20 MG tablet Take 1 tablet (20 mg total) by mouth daily.  30 tablet  5  . isosorbide mononitrate (IMDUR) 30 MG 24 hr tablet Take 1 tablet (30 mg total) by mouth daily.  30 tablet  6  . losartan (COZAAR) 25 MG tablet Take 1 tablet (25 mg total) by mouth daily.  90 tablet  3  . metoprolol succinate (TOPROL-XL) 100 MG 24 hr tablet Take 1 tablet (100 mg total) by mouth daily. Take with or immediately following a meal.  30 tablet  6  . niacin (NIASPAN) 500 MG CR tablet Take 500 mg by mouth at bedtime. Takes 30 minutes after taking aspirin.      . nitroGLYCERIN (NITROSTAT) 0.4 MG SL tablet Place 1 tablet (0.4 mg  total) under the tongue every 5 (five) minutes as needed for chest pain.  25 tablet  3  . prasugrel (EFFIENT) 10 MG TABS tablet Take 1 tablet (10 mg total) by mouth daily.  30 tablet  11  . ranolazine (RANEXA) 500 MG 12 hr tablet Take 1 tablet (500 mg total) by mouth 2 (two) times daily.  60 tablet  6  . rosuvastatin (CRESTOR) 20 MG tablet Take 1 tablet (20 mg total) by mouth daily at 6 PM.  30 tablet  6   No current facility-administered medications on file prior to visit.    ALLERGIES REVIEWED IN EPIC -- No change SOCIAL AND FAMILY HISTORY REVIEWED IN EPIC --  NO change  Wt Readings from Last 3 Encounters:  11/21/13 188 lb 12 oz (85.616 kg)  11/16/13 189 lb 9.5 oz (86 kg)  11/16/13 189 lb 9.5 oz (86 kg)    PHYSICAL EXAM BP 118/78  Pulse 71  Ht 6' (1.829 m)  Wt 188 lb 12 oz (85.616 kg)  BMI 25.59 kg/m2  General appearance: alert, cooperative, appears stated age, he no longer seems depressed. He actually has more color than usual today and in somewhat better spirits. Well-nourished and well-groomed  Neck: no adenopathy, no carotid bruit, no JVD and supple, symmetrical, trachea midline  Lungs: diminished breath sounds bilaterally, wheezes bilaterally and Otherwise the most part CPAP. Nonlabored. Increased AP diameter. Prolonged expiratory phase  Heart: RRR with ectopy, S1, S2 normal, no murmur, click, rub or gallop and normal apical impulse  Abdomen: soft, non-tender; bowel sounds normal; no masses, no organomegaly and Mildly protuberant  Extremities: extremities normal, atraumatic, no cyanosis or edema  Pulses: 2+ and symmetric  Neurologic: Mental status: Alert, oriented, thought content appropriate, affect: blunted and mood-congruent  Cranial nerves: normal will   Adult ECG Report  Rate: 71 ;  Rhythm: normal sinus rhythm diffuse mild ST and T-wave changes.. No ischemic changes.  Narrative Interpretation: Stable EKG  ASSESSMENT / PLAN: Precordial chest pain The chest discomfort  he is noticing is more lying down and improved with sitting up. It does not sound like angina decubitus however it sounds in his description as not consistent with his anginal symptom. They seem to believe that it may be more related to some of the inflammatory response. I was not able to reproduce the pain. I am reluctant to use end stage disease on aspirin plus Effient, slowly use a steroid taper.  Plan: Prednisone 60 mg daily x3 days, 40 mg daily x3 days, 20 mg daily x3 days.  Dry cough This could be related to COPD, and it may be related to an ARB. We'll see how he does after his steroid taper. He continues that at beyond that course, I would consider switching him from losartan to candesartan or olmesartan.  Angina, class III Notable improvement after his most recent angioplasty. He is no longer having exertional angina. He will gradually pick up his level of exercise/ exertion. He is now able to tolerate the Imdur which have asked him to take at nighttime. He is also taking Ranexa and a beta blocker  CAD S/P percutaneous coronary angioplasty He now has essentially complete revascularization with the entire RCA system revascularized with widely patent stents in the LAD in the circumflex left OM. He has multiple stents and therefore would be on lifelong Thienopyridine therapy with probably Effient or Plavix. Could potentially stop aspirin after several months to a year. The chest discomfort is having at night does not seem to be consistent with his angina. Hopefully if he is able to be asymptomatic for the next 3 months we can presume that the angioplasty sites are at least as stable as a bare-metal stent would be. If possible both sides could potentially be treated with drug-eluting stents a 2.25 mm range. He remains on beta blocker and statin.  Referred to Phase 2 Cardiac Rehabilitation at [facility]  Cardiomyopathy, ischemic; EF~45% with hypokinesis of the inferior and inferolateral  myocardium; CO 3.25 He has a pretty well-preserved EF despite his multiple MIs. I wonder potentially his EF could pick up after the LAD PCI. He also had reduced cardiac output on right heart catheterization which  I hope would have improved. He is on a good stable dose of beta blocker as well as an ARB but we may need to switch brands. He is on a standing dose of Lasix with sliding scale. Does not seem to be having active heart type symptoms.  Chronic combined systolic and diastolic congestive heart failure, NYHA class 2 He still has exertional dyspnea probably mostly related to diastolic failure. The systolic component is probably improved. Hopefully again by revascularizing his entire RCA territory the diastolic dysfunction will be improved.  Plan: Consider rechecking echocardiogram in roughly 3-4 months which would give enough time for recovery following his PCI to both LAD and RCA   Essential hypertension Stable blood pressure control.  Hyperlipidemia with target LDL less than 70; by his report he is back on Crestor Back on Crestor. Will reorder after three-month followup  DOE (dyspnea on exertion) - Class III Notably improved. Hopefully this will continue to get better with cardiac rehabilitation.  Smoker He finally officially quit in May. He continues to do well from a smoking cessation standpoint    Orders Placed This Encounter  Procedures  . EKG 12-Lead    Order Specific Question:  Where should this test be performed    Answer:  [facility]   Meds ordered this encounter  Medications  . PROVENTIL HFA 108 (90 BASE) MCG/ACT inhaler    Sig: Inhale 1 puff into the lungs every 6 (six) hours as needed.   Marland Kitchen DISCONTD: predniSONE (DELTASONE) 20 MG tablet    Sig: Take 1 tablet (20 mg total) by mouth daily with breakfast.    Dispense:  18 tablet    Refill:  0  . predniSONE (DELTASONE) 20 MG tablet    Sig: Please start Prednisone 60 mg (3 tablets) for three days, 40 mg (2  tablets) for three days, 20 mg (1 tablet) for three days    Dispense:  18 tablet    Refill:  0    Followup: 3 months   HARDING,DAVID W, M.D., M.S. Interventional Cardiologist   Pager # [phone]

## 2013-11-21 NOTE — Assessment & Plan Note (Addendum)
He now has essentially complete revascularization with the entire RCA system revascularized with widely patent stents in the LAD in the circumflex left OM. He has multiple stents and therefore would be on lifelong Thienopyridine therapy with probably Effient or Plavix. Could potentially stop aspirin after several months to a year. The chest discomfort is having at night does not seem to be consistent with his angina. Hopefully if he is able to be asymptomatic for the next 3 months we can presume that the angioplasty sites are at least as stable as a bare-metal stent would be. If possible both sides could potentially be treated with drug-eluting stents a 2.25 mm range. He remains on beta blocker and statin.  Referred to Phase 2 Cardiac Rehabilitation at [facility]

## 2013-11-21 NOTE — Assessment & Plan Note (Signed)
Notable improvement after his most recent angioplasty. He is no longer having exertional angina. He will gradually pick up his level of exercise/ exertion. He is now able to tolerate the Imdur which have asked him to take at nighttime. He is also taking Ranexa and a beta blocker

## 2013-11-21 NOTE — Assessment & Plan Note (Signed)
Back on Crestor. Will reorder after three-month followup

## 2013-11-21 NOTE — Progress Notes (Signed)
Patient was changed to Dr. Allison Quarry schedule as he is a patient of his.   This encounter was created in error - please disregard.

## 2013-11-21 NOTE — Assessment & Plan Note (Signed)
This could be related to COPD, and it may be related to an ARB. We'll see how he does after his steroid taper. He continues that at beyond that course, I would consider switching him from losartan to candesartan or olmesartan.

## 2013-11-21 NOTE — Assessment & Plan Note (Signed)
He finally officially quit in May. He continues to do well from a smoking cessation standpoint

## 2013-11-21 NOTE — Assessment & Plan Note (Signed)
He has a pretty well-preserved EF despite his multiple MIs. I wonder potentially his EF could pick up after the LAD PCI. He also had reduced cardiac output on right heart catheterization which I hope would have improved. He is on a good stable dose of beta blocker as well as an ARB but we may need to switch brands. He is on a standing dose of Lasix with sliding scale. Does not seem to be having active heart type symptoms.

## 2013-11-21 NOTE — Assessment & Plan Note (Signed)
Notably improved. Hopefully this will continue to get better with cardiac rehabilitation.

## 2013-11-21 NOTE — Assessment & Plan Note (Signed)
Stable blood pressure control.

## 2013-11-21 NOTE — Patient Instructions (Addendum)
Your physician has recommended you make the following change in your medication:  Please start Prednisone 60 mg (3 tablets) for three days  40 mg (2 tablets) for three days  20 mg (1 tablet) for three days   Your physician wants you to follow-up in: 3 months.    Take your Imdur at bedtime  Please call if your cough continues

## 2013-12-05 ENCOUNTER — Telehealth: Payer: Self-pay | Admitting: Cardiology

## 2013-12-05 ENCOUNTER — Ambulatory Visit: Payer: BC Managed Care – PPO | Admitting: Physician Assistant

## 2013-12-05 NOTE — Telephone Encounter (Signed)
Troy Parrish is ready to proceed with cardiac rehab at [facility].  He was supposed to be referred after his PCI in October, but has not heard anything from them.  I sent cardiac rehab a message and will have Troy Parrish follow up on the status of the referral.

## 2013-12-05 NOTE — Telephone Encounter (Signed)
I received word from [facility]).  They have his information and will contact him to have him set up.

## 2013-12-05 NOTE — Telephone Encounter (Signed)
Thanks guys -- you rock.  He needs it,  Things go confused b/c he is now seen @ [place] office -- was originally referred to [facility] & he wanted [facility].  Thanks.  [place]

## 2013-12-05 NOTE — Telephone Encounter (Signed)
Pt called in stating that he had a stent put in around 10/7 by Dr. Ellyn Hack and now he is interested in starting cardiac rehab. He would prefer to do his rehab in [place] at [place]. He would like to know what the next step he needs to take. Please call  Thanks

## 2013-12-11 ENCOUNTER — Telehealth: Payer: Self-pay | Admitting: Cardiology

## 2013-12-11 ENCOUNTER — Telehealth: Payer: Self-pay | Admitting: *Deleted

## 2013-12-11 NOTE — Telephone Encounter (Signed)
[name]  He states his blood pressure has been increasing. Per patient his blood pressure ranging 156/99,160/104,175/104, but he has been getting reading from a blood pressure machine at local [employer]. Patient states he has been sleeping a lot.  Recent change in medications  Metoprolol 100 mg daily  @10  am  RN recommend patient have blood pressure check at [facility] office for accuracy. RN spoke to [place] office - appointment for Thursday 12/13/13 at 4 pm- patient verbalized that the time is okay. Patient has not  Started rehab yet.

## 2013-12-11 NOTE — Telephone Encounter (Signed)
Patient referred to Phase II cardiac re[facility] contact [facility] to schedule him

## 2013-12-11 NOTE — Telephone Encounter (Signed)
Please call,blood pressure been running high for the last 5 days.Please call to advise,does he need to come in?

## 2013-12-11 NOTE — Telephone Encounter (Signed)
That is surprising -- Can restart ARB  Or Amlodipine.  [place]

## 2013-12-12 NOTE — Telephone Encounter (Signed)
Do you want to wait to start something until we do his blood pressure check tomorrow?

## 2013-12-13 ENCOUNTER — Encounter (HOSPITAL_COMMUNITY)
Admission: RE | Admit: 2013-12-13 | Discharge: 2013-12-13 | Disposition: A | Discharge status: Home / Self Care | Payer: BC Managed Care – PPO | Source: Ambulatory Visit | Attending: Cardiology | Admitting: Cardiology

## 2013-12-13 ENCOUNTER — Ambulatory Visit: Payer: BC Managed Care – PPO | Admitting: *Deleted

## 2013-12-13 VITALS — BP 119/79

## 2013-12-13 DIAGNOSIS — I10 Essential (primary) hypertension: Secondary | ICD-10-CM | POA: Insufficient documentation

## 2013-12-13 DIAGNOSIS — Z7902 Long term (current) use of antithrombotics/antiplatelets: Secondary | ICD-10-CM | POA: Insufficient documentation

## 2013-12-13 DIAGNOSIS — Z955 Presence of coronary angioplasty implant and graft: Secondary | ICD-10-CM | POA: Insufficient documentation

## 2013-12-13 DIAGNOSIS — Z79899 Other long term (current) drug therapy: Secondary | ICD-10-CM | POA: Insufficient documentation

## 2013-12-13 DIAGNOSIS — E119 Type 2 diabetes mellitus without complications: Secondary | ICD-10-CM | POA: Insufficient documentation

## 2013-12-13 DIAGNOSIS — I2510 Atherosclerotic heart disease of native coronary artery without angina pectoris: Secondary | ICD-10-CM | POA: Insufficient documentation

## 2013-12-13 DIAGNOSIS — I255 Ischemic cardiomyopathy: Secondary | ICD-10-CM | POA: Insufficient documentation

## 2013-12-13 DIAGNOSIS — Z87891 Personal history of nicotine dependence: Secondary | ICD-10-CM | POA: Insufficient documentation

## 2013-12-13 DIAGNOSIS — I252 Old myocardial infarction: Secondary | ICD-10-CM | POA: Insufficient documentation

## 2013-12-13 DIAGNOSIS — J449 Chronic obstructive pulmonary disease, unspecified: Secondary | ICD-10-CM | POA: Insufficient documentation

## 2013-12-13 DIAGNOSIS — Z7982 Long term (current) use of aspirin: Secondary | ICD-10-CM | POA: Insufficient documentation

## 2013-12-13 DIAGNOSIS — R0602 Shortness of breath: Secondary | ICD-10-CM

## 2013-12-13 DIAGNOSIS — E785 Hyperlipidemia, unspecified: Secondary | ICD-10-CM | POA: Insufficient documentation

## 2013-12-13 DIAGNOSIS — Z5189 Encounter for other specified aftercare: Secondary | ICD-10-CM | POA: Insufficient documentation

## 2013-12-13 DIAGNOSIS — I5042 Chronic combined systolic (congestive) and diastolic (congestive) heart failure: Secondary | ICD-10-CM | POA: Insufficient documentation

## 2013-12-13 NOTE — Telephone Encounter (Signed)
Would actually add losartan 25 mg.  [place]

## 2013-12-13 NOTE — Progress Notes (Signed)
Cardiac Rehab Medication Review by a Pharmacist  Does the patient  feel that his/her medications are working for him/her?  Yes  Has the patient been experiencing any side effects to the medications prescribed?  No;  States that he experiences Niacin flushing but he takes aspirin to off-set the flushing.  Does the patient measure his/her own blood pressure or blood glucose at home?  Yes; 156/102 - is following up with MD today for a blood pressure check - he feels that the calibration of his blood pressure cuff may be off.  Does the patient have any problems obtaining medications due to transportation or finances?   no  Understanding of regimen: excellent Understanding of indications: excellent Potential of compliance: excellent    Pharmacist comments: Patient appears to be compliant with medications and has a good understanding of his regimen. He has had questionable hypertension but is following up with his physician today.    Theron Arista, PharmD Clinical Pharmacist - Resident Pager: [phone] 11/5/20159:13 AM

## 2013-12-17 ENCOUNTER — Telehealth (HOSPITAL_COMMUNITY): Payer: Self-pay | Admitting: Family Medicine

## 2013-12-17 ENCOUNTER — Encounter (HOSPITAL_COMMUNITY): Payer: BC Managed Care – PPO

## 2013-12-19 ENCOUNTER — Encounter (HOSPITAL_COMMUNITY)
Admission: RE | Admit: 2013-12-19 | Discharge: 2013-12-19 | Disposition: A | Discharge status: Home / Self Care | Payer: BC Managed Care – PPO | Source: Ambulatory Visit | Attending: Cardiology | Admitting: Cardiology

## 2013-12-19 DIAGNOSIS — Z5189 Encounter for other specified aftercare: Secondary | ICD-10-CM | POA: Diagnosis not present

## 2013-12-19 DIAGNOSIS — Z7902 Long term (current) use of antithrombotics/antiplatelets: Secondary | ICD-10-CM | POA: Diagnosis not present

## 2013-12-19 DIAGNOSIS — Z87891 Personal history of nicotine dependence: Secondary | ICD-10-CM | POA: Diagnosis not present

## 2013-12-19 DIAGNOSIS — I255 Ischemic cardiomyopathy: Secondary | ICD-10-CM | POA: Diagnosis not present

## 2013-12-19 DIAGNOSIS — J449 Chronic obstructive pulmonary disease, unspecified: Secondary | ICD-10-CM | POA: Diagnosis not present

## 2013-12-19 DIAGNOSIS — I252 Old myocardial infarction: Secondary | ICD-10-CM | POA: Diagnosis not present

## 2013-12-19 DIAGNOSIS — I2510 Atherosclerotic heart disease of native coronary artery without angina pectoris: Secondary | ICD-10-CM | POA: Diagnosis not present

## 2013-12-19 DIAGNOSIS — Z7982 Long term (current) use of aspirin: Secondary | ICD-10-CM | POA: Diagnosis not present

## 2013-12-19 DIAGNOSIS — Z955 Presence of coronary angioplasty implant and graft: Secondary | ICD-10-CM | POA: Diagnosis not present

## 2013-12-19 DIAGNOSIS — Z79899 Other long term (current) drug therapy: Secondary | ICD-10-CM | POA: Diagnosis not present

## 2013-12-19 DIAGNOSIS — I5042 Chronic combined systolic (congestive) and diastolic (congestive) heart failure: Secondary | ICD-10-CM | POA: Diagnosis not present

## 2013-12-19 DIAGNOSIS — E785 Hyperlipidemia, unspecified: Secondary | ICD-10-CM | POA: Diagnosis not present

## 2013-12-19 DIAGNOSIS — I10 Essential (primary) hypertension: Secondary | ICD-10-CM | POA: Diagnosis not present

## 2013-12-19 DIAGNOSIS — E119 Type 2 diabetes mellitus without complications: Secondary | ICD-10-CM | POA: Diagnosis not present

## 2013-12-19 LAB — GLUCOSE, CAPILLARY
Glucose-Capillary: 129 mg/dL — ABNORMAL HIGH (ref 70–99)
Glucose-Capillary: 99 mg/dL (ref 70–99)

## 2013-12-19 NOTE — Progress Notes (Signed)
Pt started cardiac rehab today.  Pt tolerated light exercise without difficulty. Telemetry rhythm Sinus.Vital signs stable. PHQ score=0.  Troy Parrish's goals are to improve shortness of breath with walking and to loose weight. Will continue to monitor the patient throughout  the program.

## 2013-12-21 ENCOUNTER — Encounter (HOSPITAL_COMMUNITY)
Admission: RE | Admit: 2013-12-21 | Discharge: 2013-12-21 | Disposition: A | Discharge status: Home / Self Care | Payer: BC Managed Care – PPO | Source: Ambulatory Visit | Attending: Cardiology | Admitting: Cardiology

## 2013-12-21 DIAGNOSIS — Z5189 Encounter for other specified aftercare: Secondary | ICD-10-CM | POA: Diagnosis not present

## 2013-12-24 ENCOUNTER — Encounter: Payer: Self-pay | Admitting: Pulmonary Disease

## 2013-12-24 ENCOUNTER — Ambulatory Visit (INDEPENDENT_AMBULATORY_CARE_PROVIDER_SITE_OTHER): Payer: BC Managed Care – PPO | Admitting: Pulmonary Disease

## 2013-12-24 ENCOUNTER — Encounter (HOSPITAL_COMMUNITY)
Admission: RE | Admit: 2013-12-24 | Discharge: 2013-12-24 | Disposition: A | Discharge status: Home / Self Care | Payer: BC Managed Care – PPO | Source: Ambulatory Visit | Attending: Cardiology | Admitting: Cardiology

## 2013-12-24 VITALS — BP 100/70 | HR 79 | Temp 97.7°F | Ht 72.0 in | Wt 191.2 lb

## 2013-12-24 DIAGNOSIS — G4733 Obstructive sleep apnea (adult) (pediatric): Secondary | ICD-10-CM

## 2013-12-24 DIAGNOSIS — R0600 Dyspnea, unspecified: Secondary | ICD-10-CM

## 2013-12-24 DIAGNOSIS — R0609 Other forms of dyspnea: Secondary | ICD-10-CM | POA: Insufficient documentation

## 2013-12-24 DIAGNOSIS — J42 Unspecified chronic bronchitis: Secondary | ICD-10-CM

## 2013-12-24 DIAGNOSIS — Z5189 Encounter for other specified aftercare: Secondary | ICD-10-CM | POA: Diagnosis not present

## 2013-12-24 NOTE — Assessment & Plan Note (Signed)
Advised him to continue CPAP, especially in light of his CAD.

## 2013-12-24 NOTE — Progress Notes (Signed)
Chief Complaint  Patient presents with  . Follow-up    OSA- pt stopped using CPAP d/t intolerance x 2 months ago. Pt has been trying to restart this d/t trouble sleeping. Pt states that he kept pulling mask off during sleep.     History of Present Illness: Benigno Check is a 53 y.o. male former smoker with OSA, and borderline COPD.  Since his last visit he had heart cath with stent >> breathing much better.  He only uses breo/albuterol prn >> not very often now.  He is not having cough, wheeze, or sputum.  He is doing well with cardiac rehab.  He was not able to use CPAP when he was having trouble with his breathing.  His sleep was worse w/o CPAP.  He has resumed this.   TESTS: Echo 07/10/11 >> EF 50 to 63%, grade 1 diastolic dysfx  Spirometry 11/09/11 >> FEV1 3.26 (67%), FEV1% 67 PFT 04/11/13 >> FEV1 2.65 (69%), FEV1% 72, TLC 6.58 (94%), DLCO 70%, +BD  PSG 04/13/13 >> AHI 59.2, SpO2 low 86%. Auto CPAP 05/26/13 to 06/08/13 >> used on 14 of 14 nights with average 4 hrs 51 min.  Average AHI 1.5 with median CPAP 8 cm H2O and 95 th percentile CPAP 11 cm H2O.  PMHx, PSHx, Medications, Allergies, Fhx, Shx reviewed.  Physical Exam:  General - No distress ENT - No sinus tenderness, no oral exudate, no LAN, high arched palate, MP 4, enlarged tongue, poor dentition Cardiac - s1s2 regular, no murmur Chest - No wheeze/rales/dullness Back - No focal tenderness Abd - Soft, non-tender Ext - No edema Neuro - Normal strength Skin - No rashes Psych - normal mood, and behavior   Assessment/Plan:  Chesley Mires, MD [place] [facility]/Critical Care/Sleep Pager:  [phone]

## 2013-12-24 NOTE — Assessment & Plan Note (Signed)
Most likely related to CAD >> much improved after recent cardiac interventions.

## 2013-12-24 NOTE — Assessment & Plan Note (Signed)
Mild.  He can use prn albuterol and stop breo.

## 2013-12-24 NOTE — Patient Instructions (Signed)
Follow up in 6 months 

## 2013-12-26 ENCOUNTER — Encounter (HOSPITAL_COMMUNITY)
Admission: RE | Admit: 2013-12-26 | Discharge: 2013-12-26 | Disposition: A | Discharge status: Home / Self Care | Payer: BC Managed Care – PPO | Source: Ambulatory Visit | Attending: Cardiology | Admitting: Cardiology

## 2013-12-26 DIAGNOSIS — Z5189 Encounter for other specified aftercare: Secondary | ICD-10-CM | POA: Diagnosis not present

## 2013-12-26 NOTE — Progress Notes (Signed)
Reviewed home exercise with pt today.  Pt plans to walk and go to gym in complex for exercise.  Reviewed THR, pulse, RPE, sign and symptoms, NTG use, and when to call 911 or MD.  Pt voiced understanding. Alberteen Sam, MA, ACSM RCEP

## 2013-12-28 ENCOUNTER — Encounter (HOSPITAL_COMMUNITY): Payer: BC Managed Care – PPO

## 2013-12-28 ENCOUNTER — Telehealth (HOSPITAL_COMMUNITY): Payer: Self-pay | Admitting: Family Medicine

## 2013-12-31 ENCOUNTER — Encounter: Payer: Self-pay | Admitting: Internal Medicine

## 2013-12-31 ENCOUNTER — Ambulatory Visit (INDEPENDENT_AMBULATORY_CARE_PROVIDER_SITE_OTHER): Payer: BC Managed Care – PPO | Admitting: Internal Medicine

## 2013-12-31 ENCOUNTER — Encounter (HOSPITAL_COMMUNITY): Payer: BC Managed Care – PPO

## 2013-12-31 VITALS — BP 124/92 | HR 72 | Temp 97.9°F | Ht 70.5 in | Wt 188.0 lb

## 2013-12-31 DIAGNOSIS — Z860100 Personal history of colon polyps, unspecified: Secondary | ICD-10-CM

## 2013-12-31 DIAGNOSIS — I10 Essential (primary) hypertension: Secondary | ICD-10-CM

## 2013-12-31 DIAGNOSIS — J449 Chronic obstructive pulmonary disease, unspecified: Secondary | ICD-10-CM

## 2013-12-31 DIAGNOSIS — G4733 Obstructive sleep apnea (adult) (pediatric): Secondary | ICD-10-CM

## 2013-12-31 DIAGNOSIS — Z125 Encounter for screening for malignant neoplasm of prostate: Secondary | ICD-10-CM

## 2013-12-31 DIAGNOSIS — I5042 Chronic combined systolic (congestive) and diastolic (congestive) heart failure: Secondary | ICD-10-CM

## 2013-12-31 DIAGNOSIS — E785 Hyperlipidemia, unspecified: Secondary | ICD-10-CM

## 2013-12-31 DIAGNOSIS — E119 Type 2 diabetes mellitus without complications: Secondary | ICD-10-CM

## 2013-12-31 DIAGNOSIS — Z87891 Personal history of nicotine dependence: Secondary | ICD-10-CM

## 2013-12-31 LAB — COMPREHENSIVE METABOLIC PANEL
ALT: 33 U/L (ref 0–53)
AST: 27 U/L (ref 0–37)
Albumin: 3.8 g/dL (ref 3.5–5.2)
Alkaline Phosphatase: 119 U/L — ABNORMAL HIGH (ref 39–117)
BUN: 12 mg/dL (ref 6–23)
CO2: 23 mEq/L (ref 19–32)
Calcium: 9.3 mg/dL (ref 8.4–10.5)
Chloride: 106 mEq/L (ref 96–112)
Creatinine, Ser: 0.9 mg/dL (ref 0.4–1.5)
GFR: 96.05 mL/min (ref >60.00)
Glucose, Bld: 96 mg/dL (ref 70–99)
Potassium: 4.4 mEq/L (ref 3.5–5.1)
Sodium: 140 mEq/L (ref 135–145)
Total Bilirubin: 0.4 mg/dL (ref 0.2–1.2)
Total Protein: 6.7 g/dL (ref 6.0–8.3)

## 2013-12-31 LAB — CBC
HCT: 49.3 % (ref 39.0–52.0)
Hemoglobin: 16.2 g/dL (ref 13.0–17.0)
MCHC: 32.9 g/dL (ref 30.0–36.0)
MCV: 87.1 fl (ref 78.0–100.0)
Platelets: 398 10*3/uL (ref 150.0–400.0)
RBC: 5.66 Mil/uL (ref 4.22–5.81)
RDW: 15.1 % (ref 11.5–15.5)
WBC: 12.3 10*3/uL — ABNORMAL HIGH (ref 4.0–10.5)

## 2013-12-31 LAB — LIPID PANEL
Cholesterol: 139 mg/dL (ref 0–200)
HDL: 30.7 mg/dL — ABNORMAL LOW (ref >39.00)
NonHDL: 108.3
Total CHOL/HDL Ratio: 5
Triglycerides: 242 mg/dL — ABNORMAL HIGH (ref 0.0–149.0)
VLDL: 48.4 mg/dL — ABNORMAL HIGH (ref 0.0–40.0)

## 2013-12-31 LAB — PSA: PSA: 0.29 ng/mL (ref 0.10–4.00)

## 2013-12-31 LAB — HEMOGLOBIN A1C: Hgb A1c MFr Bld: 6.4 % (ref 4.6–6.5)

## 2013-12-31 NOTE — Assessment & Plan Note (Signed)
Will repeat A1C today If > 7.5%, will start Metformin- discussed with pt and he agrees Advised him to get an eye exam yearly Continue to work on diet and exercise

## 2013-12-31 NOTE — Progress Notes (Signed)
Pre visit review using our clinic review tool, if applicable. No additional management support is needed unless otherwise documented below in the visit note. 

## 2013-12-31 NOTE — Progress Notes (Signed)
HPI  Pt presents to the clinic today to establish care. He has not had a PCP in many years. He does follow with cardiology every 3 months for his heart conditions. He also follows with pulmonology every 6 months for his COPD and sleep apnea. He has no concerns today.  HTN: Diastolic BP slightly elevated today, otherwise well controlled. BP today is 124/92. He is on cozaar. Last BMET 11/2013 reviewed- normal BUN, creatinine, potassium.  HLD: last lipid profile 05/2012 reviewed. LDL was 66, controlled on crestor and niacin. Denies myalgias.  CAD with angina s/p stent placement: Followed by cardiology. Currently stable on statin, ARB, metoprolol, ranexa, imdur and effient. Has not had to use nitroglycerin yet.  Heart failure: Combined, but no issues on lasix.  Sleep apnea: No longer using CPAP because every morning when he wakes up, it is off. He reports he must be pulling it off in his sleep.  COPD: Follows with pulmonology. Uses albuterol prn. Used to be on Advair but did not like it, so stopped. Quit smoking 06/2013 with the help of wellbutrin. He does not want to stop the wellbutrin for fear of relapse.  History of colonic polyps: Can not remember his last colonoscopy but reports it was before theage of 50. Feels like he is past due. Would like referral to gastroenterology for further evaluation.  DM2: Reports he has been told that he was borderline diabetic. Not medicated. Last A1C 07/2013- 6.6%. He has cut back on his soda and sweets. Would like it rechecked today.  Flu: yearly 11/2013 Tetanus: > 10 years ago Pneumonia Vaccine: 2013 (prevnar) PSA Screening: at age 11 Colon Screening: ? 5 years ago in [place]: as needed Dentist: as needed  Past Medical History  Diagnosis Date  . Essential hypertension 07/07/2011  . History of colon polyps   . Hyperlipidemia with target LDL less than 70 07/07/2011  . Recurrent upper respiratory infection (URI)   . CAD S/P  percutaneous coronary angioplasty 07/07/2011; 6 & 10 2015    S/P PCI to all 3 major vessels; Ant STEMI 2001- BMS-LAD x2 -->(redo PCI in June 2015 -- pLAD Xience DES 2.17mm x 60mm- 2.75, mLAD 2.25 mm x 12 mm - 2.7 mm), UA --> Cx- OM DES 2006, 2013 Inf MI BMS mRCA --> 11/2013 PCI dRCA Promus P DES 4.0 x 63mm (4.25); PTCA of RPL2 (2.0 mm) &RPDA (2.25 mm) 11/2013  . ST elevation myocardial infarction (STEMI) of anterior wall 2001    History of -- 2 stents in early and distal mid LAD; prior cardiologist was Dr. Remi Haggard in [place]  . ST elevation myocardial infarction (STEMI) of inferior wall 07/07/2011    Occluded RCA 2.75X18 INTEGRITY  . Unstable angina  2006; June 2015    Cx-OM - PCI 2.5 mm 13 mm Cypher DES [name], [place]) ; 07/2013: Severe in-stent restenosis of both proximal and distal LAD stent treated with 2 DES stents (1 at proximal edge of the proximal stent, second stent covers the entire distal stent as well as proximal and distal edge stenoses, moderate disease in distal RCA with ostial small PL branch disease as well but FFR was 0.83 - medical management   . COPD (chronic obstructive pulmonary disease)   . Former heavy tobacco smoker      quit in May 2015 after multiple attempts at trying to quit before   . Glucose intolerance (pre-diabetes) June 2015     hemoglobin A1c 6.6  . Metabolic syndrome  Pre-diabetes, hypertension and truncal obesity as well as dyslipidemia  . Upper respiratory infection   . Heart murmur   . Pneumonia "several times"  . Chronic bronchitis "used to get it q yr"  . OSA on CPAP   . GERD (gastroesophageal reflux disease)   . History of stomach ulcers   . Headache     "Imdur related; stopped taking it; headaches went away" (11/15/2013)  . Seizures "several"    "last one was ~ 2011" (11/15/2013)    Current Outpatient Prescriptions  Medication Sig Dispense Refill  . aspirin EC 81 MG tablet Take 162 mg by mouth at bedtime.    Marland Kitchen buPROPion (WELLBUTRIN XL)  150 MG 24 hr tablet Take 150 mg by mouth daily.    . furosemide (LASIX) 20 MG tablet Take 1 tablet (20 mg total) by mouth daily. 30 tablet 5  . isosorbide mononitrate (IMDUR) 30 MG 24 hr tablet Take 1 tablet (30 mg total) by mouth daily. 30 tablet 6  . losartan (COZAAR) 25 MG tablet Take 1 tablet (25 mg total) by mouth daily. 90 tablet 3  . metoprolol succinate (TOPROL-XL) 100 MG 24 hr tablet Take 1 tablet (100 mg total) by mouth daily. Take with or immediately following a meal. 30 tablet 6  . niacin (NIASPAN) 500 MG CR tablet Take 500 mg by mouth at bedtime. Takes 30 minutes after taking aspirin.    . nitroGLYCERIN (NITROSTAT) 0.4 MG SL tablet Place 1 tablet (0.4 mg total) under the tongue every 5 (five) minutes as needed for chest pain. 25 tablet 3  . prasugrel (EFFIENT) 10 MG TABS tablet Take 1 tablet (10 mg total) by mouth daily. 30 tablet 11  . PROVENTIL HFA 108 (90 BASE) MCG/ACT inhaler Inhale 1 puff into the lungs every 6 (six) hours as needed.     . ranolazine (RANEXA) 500 MG 12 hr tablet Take 1 tablet (500 mg total) by mouth 2 (two) times daily. 60 tablet 6  . rosuvastatin (CRESTOR) 20 MG tablet Take 1 tablet (20 mg total) by mouth daily at 6 PM. 30 tablet 6   No current facility-administered medications for this visit.    Allergies  Allergen Reactions  . Niacin And Related Hives and Itching    Family History  Problem Relation Age of Onset  . Coronary artery disease Father   . Heart disease Father   . Stroke Father   . Hypertension Father   . Hyperlipidemia Mother   . Diabetes Mother   . Hyperlipidemia Maternal Grandmother   . Diabetes Maternal Grandmother   . Hyperlipidemia Maternal Grandfather   . Diabetes Maternal Grandfather   . Heart disease Paternal Grandmother   . Heart disease Paternal Grandfather     History   Social History  . Marital Status: Married    Spouse Name: N/A    Number of Children: N/A  . Years of Education: N/A   Occupational History  . Not  on file.   Social History Main Topics  . Smoking status: Former Smoker -- 0.50 packs/day for 40 years    Types: Cigarettes    Quit date: 06/19/2013  . Smokeless tobacco: Never Used     Comment: Quit with Bupropion 150mg   . Alcohol Use: 0.6 oz/week    1 Cans of beer per week     Comment: rare  . Drug Use: No  . Sexual Activity: Yes   Other Topics Concern  . Not on file   Social History Narrative  He is a married father of 44, grandfather of 16. He does not really get routine exercise.    He is down to 5 or 6 cigarettes a day. He is very seriously wanting to quit. This is a significant cutback for him, but he has pretty much been told he needs to stop by his wife, and so he fully intends to do so. He is willing to try the patches or whatever. He has a social alcoholic beverage every now and then.     ROS:  Constitutional: Pt reports fatigue.Denies fever, malaise, headache or abrupt weight changes.  Respiratory: Denies difficulty breathing, shortness of breath, cough or sputum production.   Cardiovascular: Denies chest pain, chest tightness, palpitations or swelling in the hands or feet.  Gastrointestinal: Denies abdominal pain, bloating, constipation, diarrhea or blood in the stool.  Skin: Denies redness, rashes, lesions or ulcercations.  Neurological: Denies dizziness, difficulty with memory, difficulty with speech or problems with balance and coordination.   No other specific complaints in a complete review of systems (except as listed in HPI above).  PE:  BP 124/92 mmHg  Pulse 72  Temp(Src) 97.9 F (36.6 C) (Oral)  Ht 5' 10.5" (1.791 m)  Wt 188 lb (85.276 kg)  BMI 26.58 kg/m2  SpO2 97% Wt Readings from Last 3 Encounters:  12/31/13 188 lb (85.276 kg)  12/24/13 191 lb 3.2 oz (86.728 kg)  12/13/13 185 lb 10 oz (84.2 kg)    General: Appears his stated age, well developed, well nourished in NAD. HEENT: Head: normal shape and size; Eyes: sclera white, no icterus,  conjunctiva pink, PERRLA and EOMs intact; Cardiovascular: Normal rate and rhythm. Distant S1,S2 noted.  No murmur, rubs or gallops noted.  Pulmonary/Chest: Normal effort and coarse vesicular breath sounds. No respiratory distress. No wheezes, rales or ronchi noted.  Neurological: Alert and oriented. Coordination normal.   BMET    Component Value Date/Time   NA 137 11/16/2013 0359   NA 142 11/07/2013 1509   K 4.0 11/16/2013 0359   CL 104 11/16/2013 0359   CO2 22 11/16/2013 0359   GLUCOSE 119* 11/16/2013 0359   GLUCOSE 112* 11/07/2013 1509   BUN 13 11/16/2013 0359   BUN 13 11/07/2013 1509   CREATININE 1.01 11/16/2013 0359   CREATININE 0.89 07/26/2013 0923   CALCIUM 8.6 11/16/2013 0359   GFRNONAA 83* 11/16/2013 0359   GFRAA >90 11/16/2013 0359    Lipid Panel     Component Value Date/Time   CHOL 148 07/26/2013 0923   TRIG 275* 07/26/2013 0923   HDL 31* 07/26/2013 0923   CHOLHDL 4.8 07/26/2013 0923   VLDL 55* 07/26/2013 0923   LDLCALC 62 07/26/2013 0923    CBC    Component Value Date/Time   WBC 10.7* 11/16/2013 0359   WBC 11.8* 11/07/2013 1509   RBC 4.91 11/16/2013 0359   RBC 5.78 11/07/2013 1509   HGB 14.1 11/16/2013 0359   HCT 42.4 11/16/2013 0359   PLT 342 11/16/2013 0359   MCV 86.4 11/16/2013 0359   MCH 28.7 11/16/2013 0359   MCH 29.2 11/07/2013 1509   MCHC 33.3 11/16/2013 0359   MCHC 34.6 11/07/2013 1509   RDW 14.2 11/16/2013 0359   RDW 14.0 11/07/2013 1509   LYMPHSABS 2.8 11/07/2013 1509   EOSABS 0.2 11/07/2013 1509   BASOSABS 0.1 11/07/2013 1509    Hgb A1C Lab Results  Component Value Date   HGBA1C 6.6* 07/31/2013     Assessment and Plan:  Health Maintenance:  He declines tetanus injection today Will check PSA today Encouraged to visit a dentist and eye doctor yearly  History of polyps:  Will refer to GI for possible repeat colonoscopy  RTC in 6 months to follow up DM2

## 2013-12-31 NOTE — Assessment & Plan Note (Signed)
Well controlled on current regimen Will check CBC and CMET today He declines medication refills today

## 2013-12-31 NOTE — Assessment & Plan Note (Signed)
Euvolemic on exam today Will repeat CMET today with particular attention to potassium level He does not need medication refilled today

## 2013-12-31 NOTE — Assessment & Plan Note (Signed)
No longer wearing CPAP Advised him to discuss maybe a different mask with pulmonology

## 2013-12-31 NOTE — Assessment & Plan Note (Signed)
Will repeat lipid profile and CMET today Continue crestor and niacin He is not in need of any refills today

## 2013-12-31 NOTE — Assessment & Plan Note (Signed)
Lung exam not bad today He has stopped smoking but will continue wellbutrin as there are other smokers in the home ? Him not being on an ICS- but will defer to Dr. Halford Chessman Continue albuterol prn

## 2013-12-31 NOTE — Patient Instructions (Signed)

## 2013-12-31 NOTE — Assessment & Plan Note (Signed)
Stopped with wellbutirn but continues to take medication for fear of relapse

## 2014-01-01 ENCOUNTER — Telehealth: Payer: Self-pay | Admitting: Internal Medicine

## 2014-01-01 LAB — LDL CHOLESTEROL, DIRECT: Direct LDL: 72.7 mg/dL

## 2014-01-01 NOTE — Telephone Encounter (Signed)
emmi emailed °

## 2014-01-02 ENCOUNTER — Encounter (HOSPITAL_COMMUNITY): Payer: BC Managed Care – PPO

## 2014-01-07 ENCOUNTER — Encounter (HOSPITAL_COMMUNITY): Payer: BC Managed Care – PPO

## 2014-01-07 ENCOUNTER — Telehealth (HOSPITAL_COMMUNITY): Payer: Self-pay | Admitting: Internal Medicine

## 2014-01-09 ENCOUNTER — Encounter (HOSPITAL_COMMUNITY): Payer: BC Managed Care – PPO

## 2014-01-11 ENCOUNTER — Encounter (HOSPITAL_COMMUNITY): Payer: BC Managed Care – PPO | Attending: Cardiology

## 2014-01-11 DIAGNOSIS — J449 Chronic obstructive pulmonary disease, unspecified: Secondary | ICD-10-CM | POA: Insufficient documentation

## 2014-01-11 DIAGNOSIS — Z955 Presence of coronary angioplasty implant and graft: Secondary | ICD-10-CM | POA: Insufficient documentation

## 2014-01-11 DIAGNOSIS — Z7902 Long term (current) use of antithrombotics/antiplatelets: Secondary | ICD-10-CM | POA: Insufficient documentation

## 2014-01-11 DIAGNOSIS — I10 Essential (primary) hypertension: Secondary | ICD-10-CM | POA: Insufficient documentation

## 2014-01-11 DIAGNOSIS — Z87891 Personal history of nicotine dependence: Secondary | ICD-10-CM | POA: Insufficient documentation

## 2014-01-11 DIAGNOSIS — I2510 Atherosclerotic heart disease of native coronary artery without angina pectoris: Secondary | ICD-10-CM | POA: Insufficient documentation

## 2014-01-11 DIAGNOSIS — I255 Ischemic cardiomyopathy: Secondary | ICD-10-CM | POA: Insufficient documentation

## 2014-01-11 DIAGNOSIS — E785 Hyperlipidemia, unspecified: Secondary | ICD-10-CM | POA: Insufficient documentation

## 2014-01-11 DIAGNOSIS — Z7982 Long term (current) use of aspirin: Secondary | ICD-10-CM | POA: Insufficient documentation

## 2014-01-11 DIAGNOSIS — I5042 Chronic combined systolic (congestive) and diastolic (congestive) heart failure: Secondary | ICD-10-CM | POA: Insufficient documentation

## 2014-01-11 DIAGNOSIS — E119 Type 2 diabetes mellitus without complications: Secondary | ICD-10-CM | POA: Insufficient documentation

## 2014-01-11 DIAGNOSIS — I252 Old myocardial infarction: Secondary | ICD-10-CM | POA: Insufficient documentation

## 2014-01-11 DIAGNOSIS — Z5189 Encounter for other specified aftercare: Secondary | ICD-10-CM | POA: Insufficient documentation

## 2014-01-11 DIAGNOSIS — Z79899 Other long term (current) drug therapy: Secondary | ICD-10-CM | POA: Insufficient documentation

## 2014-01-14 ENCOUNTER — Encounter (HOSPITAL_COMMUNITY): Payer: BC Managed Care – PPO

## 2014-01-16 ENCOUNTER — Encounter (HOSPITAL_COMMUNITY): Payer: BC Managed Care – PPO

## 2014-01-17 ENCOUNTER — Encounter (HOSPITAL_COMMUNITY): Payer: Self-pay | Admitting: Cardiology

## 2014-01-18 ENCOUNTER — Encounter (HOSPITAL_COMMUNITY): Payer: BC Managed Care – PPO

## 2014-01-21 ENCOUNTER — Encounter (HOSPITAL_COMMUNITY): Payer: BC Managed Care – PPO

## 2014-01-22 ENCOUNTER — Other Ambulatory Visit: Payer: Self-pay | Admitting: Pulmonary Disease

## 2014-01-22 ENCOUNTER — Other Ambulatory Visit: Payer: Self-pay | Admitting: Cardiology

## 2014-01-23 ENCOUNTER — Encounter (HOSPITAL_COMMUNITY): Payer: BC Managed Care – PPO

## 2014-01-24 ENCOUNTER — Telehealth (HOSPITAL_COMMUNITY): Payer: Self-pay | Admitting: *Deleted

## 2014-01-25 ENCOUNTER — Encounter (HOSPITAL_COMMUNITY): Payer: BC Managed Care – PPO

## 2014-01-28 ENCOUNTER — Encounter (HOSPITAL_COMMUNITY): Payer: BC Managed Care – PPO

## 2014-01-28 ENCOUNTER — Other Ambulatory Visit: Payer: Self-pay

## 2014-01-29 ENCOUNTER — Telehealth (HOSPITAL_COMMUNITY): Payer: Self-pay | Admitting: *Deleted

## 2014-01-30 ENCOUNTER — Encounter (HOSPITAL_COMMUNITY): Payer: BC Managed Care – PPO

## 2014-02-04 ENCOUNTER — Encounter (HOSPITAL_COMMUNITY): Payer: BC Managed Care – PPO

## 2014-02-06 ENCOUNTER — Encounter (HOSPITAL_COMMUNITY): Payer: BC Managed Care – PPO

## 2014-02-11 ENCOUNTER — Encounter (HOSPITAL_COMMUNITY): Payer: BC Managed Care – PPO

## 2014-02-13 ENCOUNTER — Encounter (HOSPITAL_COMMUNITY): Payer: BC Managed Care – PPO

## 2014-02-15 ENCOUNTER — Encounter (HOSPITAL_COMMUNITY): Payer: BC Managed Care – PPO

## 2014-02-18 ENCOUNTER — Encounter (HOSPITAL_COMMUNITY): Payer: BC Managed Care – PPO

## 2014-02-20 ENCOUNTER — Encounter (HOSPITAL_COMMUNITY): Payer: BC Managed Care – PPO

## 2014-02-22 ENCOUNTER — Encounter (HOSPITAL_COMMUNITY): Payer: BC Managed Care – PPO

## 2014-02-25 ENCOUNTER — Encounter (HOSPITAL_COMMUNITY): Payer: BC Managed Care – PPO

## 2014-02-27 ENCOUNTER — Encounter (HOSPITAL_COMMUNITY): Payer: BC Managed Care – PPO

## 2014-03-01 ENCOUNTER — Encounter (HOSPITAL_COMMUNITY): Payer: BC Managed Care – PPO

## 2014-03-04 ENCOUNTER — Encounter (HOSPITAL_COMMUNITY): Payer: BC Managed Care – PPO

## 2014-03-06 ENCOUNTER — Encounter (HOSPITAL_COMMUNITY): Payer: BC Managed Care – PPO

## 2014-03-08 ENCOUNTER — Encounter (HOSPITAL_COMMUNITY): Payer: BC Managed Care – PPO

## 2014-03-11 ENCOUNTER — Encounter (HOSPITAL_COMMUNITY): Payer: BC Managed Care – PPO

## 2014-03-13 ENCOUNTER — Encounter (HOSPITAL_COMMUNITY): Payer: BC Managed Care – PPO

## 2014-03-13 ENCOUNTER — Encounter: Payer: Self-pay | Admitting: Cardiology

## 2014-03-13 ENCOUNTER — Ambulatory Visit (INDEPENDENT_AMBULATORY_CARE_PROVIDER_SITE_OTHER): Payer: 59 | Admitting: Cardiology

## 2014-03-13 VITALS — BP 110/80 | HR 78 | Ht 72.0 in | Wt 184.0 lb

## 2014-03-13 DIAGNOSIS — I208 Other forms of angina pectoris: Secondary | ICD-10-CM

## 2014-03-13 DIAGNOSIS — I2111 ST elevation (STEMI) myocardial infarction involving right coronary artery: Secondary | ICD-10-CM

## 2014-03-13 DIAGNOSIS — Z87891 Personal history of nicotine dependence: Secondary | ICD-10-CM

## 2014-03-13 DIAGNOSIS — I5042 Chronic combined systolic (congestive) and diastolic (congestive) heart failure: Secondary | ICD-10-CM

## 2014-03-13 DIAGNOSIS — I10 Essential (primary) hypertension: Secondary | ICD-10-CM

## 2014-03-13 DIAGNOSIS — I2109 ST elevation (STEMI) myocardial infarction involving other coronary artery of anterior wall: Secondary | ICD-10-CM

## 2014-03-13 DIAGNOSIS — E785 Hyperlipidemia, unspecified: Secondary | ICD-10-CM

## 2014-03-13 DIAGNOSIS — Z9861 Coronary angioplasty status: Secondary | ICD-10-CM

## 2014-03-13 DIAGNOSIS — J449 Chronic obstructive pulmonary disease, unspecified: Secondary | ICD-10-CM

## 2014-03-13 DIAGNOSIS — I2510 Atherosclerotic heart disease of native coronary artery without angina pectoris: Secondary | ICD-10-CM

## 2014-03-13 DIAGNOSIS — I5189 Other ill-defined heart diseases: Secondary | ICD-10-CM

## 2014-03-13 DIAGNOSIS — I2089 Other forms of angina pectoris: Secondary | ICD-10-CM

## 2014-03-13 NOTE — Patient Instructions (Signed)
Your physician recommends that you continue on your current medications as directed. Please refer to the Current Medication list given to you today.   Your physician wants you to follow-up in: 6 months with Dr. Harding You will receive a reminder letter in the mail two months in advance. If you don't receive a letter, please call our office to schedule the follow-up appointment.  

## 2014-03-15 ENCOUNTER — Encounter: Payer: Self-pay | Admitting: Cardiology

## 2014-03-15 ENCOUNTER — Encounter (HOSPITAL_COMMUNITY): Payer: BC Managed Care – PPO

## 2014-03-15 NOTE — Assessment & Plan Note (Signed)
He is back on Crestor.  Results are monitored by his PCP Lab Results  Component Value Date   CHOL 139 12/31/2013   HDL 30.70* 12/31/2013   LDLCALC 62 07/26/2013   LDLDIRECT 72.7 12/31/2013   TRIG 242.0* 12/31/2013   CHOLHDL 5 12/31/2013   Overall his vitals were pretty well controlled. Essentially at goal. Consider checking NMR for next followup labs.

## 2014-03-15 NOTE — Assessment & Plan Note (Signed)
Despite having an anterior MI, as well as significant with restenosis of his LAD stents, he is anterior wall seems to be moving well.

## 2014-03-15 NOTE — Progress Notes (Signed)
PCP: Webb Silversmith, NP  Clinic Note: Chief Complaint  Patient presents with  . other    C/o fatigue. Meds reviewed verbally with pt.    HPI: Troy Parrish is a 54 y.o. male with a PMH below who presents today Followup of CAD status post multivessel PCI. His most recent intervention was back in October of 2015 for persistent class III exertional dyspnea and chest pain. Perform PCI of the distal RCA with PTCA of both the posterolateral branches.  Interval History:  He notes that really since his PCI he has not had nearly as much of the fatigue with walking and ambulating. In fact his car from incontinence must have improved on from a couple years ago excuse he is is able to be tolerated beta blocker. He is still has some fatigue but is not having any notable exertional or resting chest tightness or pressure or significant dyspnea. If he exerts himself significantly he will have some dyspnea. He doesn't have other heart failure symptoms of PND, orthopnea or edema. He used to sleep on several pillows and is now down to one or 2.  He denies any rapid irregular heartbeat/palpitations, syncope/near syncope or TIA/amaurosis fugax. One other important feature is that he is more pleasant to be around and is not nearly as cranky as he used to be.  He also indicates that he has been paying more attention to what he eats and increasing his activity level. He is not doing routine exercises but is trying to getting more walking and standing otherwise active.  He is walking just about every afternoon with his wife. 1 coin in his knee is doing very well in cardiac rehabilitation but only was able to provide a month secondary to insurance changing with the new calendar year.  He is trying to keep up with the rehabilitation team with his afternoon walks.  Past Medical History  Diagnosis Date  . Essential hypertension 07/07/2011  . History of colon polyps   . Hyperlipidemia with target LDL less than 70 07/07/2011   . Recurrent upper respiratory infection (URI)   . CAD S/P percutaneous coronary angioplasty 07/07/2011; 6 & 10 2015    S/P PCI to all 3 major vessels;a)  Ant STEMI 2001- BMS-LAD x2 -->(redo PCI 6/'15 -- pLAD Xience DES 2.5 x 12- 2.75 mm, mLAD 2.25 x 12 - 2.7 mm), b) '06 UA --> Cx- OM DES; c) 2013 Inf MI BMS mRCA --> d) 10/'15 PCI dRCA Promus P DES 4.0 x 16 (4.25 mm); PTCA of RPL2 (2.0 mm) &RPDA (2.25 mm) 11/2013  . ST elevation myocardial infarction (STEMI) of anterior wall 2001    History of -- 2 stents in early and distal mid LAD; prior cardiologist was Dr. Remi Haggard in [place]  . ST elevation myocardial infarction (STEMI) of inferior wall 07/07/2011    Occluded RCA 2.75X18 INTEGRITY; Echo 07/2013: EF 45-50%, Inferior & Posterolateral HK.   Marland Kitchen Unstable angina  2006; June 2015    Cx-OM - PCI 2.5 mm 13 mm Cypher DES [name], [place]) ; 07/2013: Severe ISR of both prox & Distal LAD stentS --> 2 DES stents (1 at prox edge of the proximal stent, 2nd covers the entire distal stent as well as proximal and distal edge stenose)   . COPD (chronic obstructive pulmonary disease)   . Former heavy tobacco smoker      quit in May 2015 after multiple attempts at trying to quit before   . Glucose intolerance (pre-diabetes) June 2015  hemoglobin A1c 6.6  . Metabolic syndrome     Pre-diabetes, hypertension and truncal obesity as well as dyslipidemia  . Upper respiratory infection   . Heart murmur   . Pneumonia "several times"  . Chronic bronchitis "used to get it q yr"  . OSA on CPAP   . GERD (gastroesophageal reflux disease)   . History of stomach ulcers   . Headache     "Imdur related; stopped taking it; headaches went away" (11/15/2013)  . Seizures "several"    "last one was ~ 2011" (11/15/2013)   ROS: A comprehensive was performed. Review of Systems  Constitutional: Negative for malaise/fatigue.  HENT: Negative for nosebleeds.   Respiratory: Positive for cough (Chronic morning cough),  shortness of breath (Notably improved) and wheezing (Occasionally). Negative for sputum production.   Cardiovascular: Negative for claudication.  Gastrointestinal: Negative for blood in stool and melena.  Genitourinary: Negative for hematuria.  Musculoskeletal: Positive for back pain. Negative for falls.  Neurological: Negative for dizziness, sensory change, speech change, focal weakness, seizures and loss of consciousness.  Endo/Heme/Allergies: Bruises/bleeds easily.  Psychiatric/Behavioral: Positive for depression (But improved). The patient has insomnia ( sleeeping better). The patient is not nervous/anxious.   All other systems reviewed and are negative.   Current Outpatient Prescriptions on File Prior to Visit  Medication Sig Dispense Refill  . aspirin EC 81 MG tablet Take 162 mg by mouth at bedtime.    Marland Kitchen buPROPion (WELLBUTRIN XL) 150 MG 24 hr tablet Take 150 mg by mouth daily.    . furosemide (LASIX) 20 MG tablet Take 1 tablet (20 mg total) by mouth daily. 30 tablet 5  . isosorbide mononitrate (IMDUR) 30 MG 24 hr tablet Take 1 tablet (30 mg total) by mouth daily. 30 tablet 6  . losartan (COZAAR) 25 MG tablet TAKE 1 TABLET (25 MG TOTAL) BY MOUTH DAILY. 30 tablet 2  . metoprolol succinate (TOPROL-XL) 100 MG 24 hr tablet Take 1 tablet (100 mg total) by mouth daily. Take with or immediately following a meal. 30 tablet 6  . niacin (NIASPAN) 500 MG CR tablet Take 500 mg by mouth at bedtime. Takes 30 minutes after taking aspirin.    . nitroGLYCERIN (NITROSTAT) 0.4 MG SL tablet Place 1 tablet (0.4 mg total) under the tongue every 5 (five) minutes as needed for chest pain. 25 tablet 3  . prasugrel (EFFIENT) 10 MG TABS tablet Take 1 tablet (10 mg total) by mouth daily. 30 tablet 11  . PROVENTIL HFA 108 (90 BASE) MCG/ACT inhaler Inhale 1 puff into the lungs every 6 (six) hours as needed.     . ranolazine (RANEXA) 500 MG 12 hr tablet Take 1 tablet (500 mg total) by mouth 2 (two) times daily. 60  tablet 6  . rosuvastatin (CRESTOR) 20 MG tablet Take 1 tablet (20 mg total) by mouth daily at 6 PM. 30 tablet 6   No current facility-administered medications on file prior to visit.   Allergies  Allergen Reactions  . Niacin And Related Hives and Itching    SOCIAL AND FAMILY HISTORY REVIEWED IN EPIC -- NO change  Wt Readings from Last 3 Encounters:  03/13/14 184 lb (83.462 kg)  12/31/13 188 lb (85.276 kg)  12/24/13 191 lb 3.2 oz (86.728 kg)    PHYSICAL EXAM BP 110/80 mmHg  Pulse 78  Ht 6' (1.829 m)  Wt 184 lb (83.462 kg)  BMI 24.95 kg/m2  General appearance: alert, cooperative, appears stated age, he no longer seems depressed. He  actually has more color than usual today and in somewhat better spirits. Well-nourished and well-groomed  Neck: no adenopathy, no carotid bruit, no JVD and supple, symmetrical, trachea midline  Lungs: diminished breath sounds bilaterally, wheezes bilaterally and Otherwise the most part CPAP. Nonlabored. Increased AP diameter. Prolonged expiratory phase  Heart: RRR with ectopy, S1, S2 normal, no murmur, click, rub or gallop and normal apical impulse  Abdomen: soft, non-tender; bowel sounds normal; no masses, no organomegaly and Mildly protuberant  Extremities: extremities normal, atraumatic, no cyanosis or edema  Pulses: 2+ and symmetric  Neurologic: Mental status: Alert, oriented, thought content appropriate, affect: blunted and mood-congruent  Cranial nerves: normal will   Adult ECG Report not performed  ASSESSMENT / PLAN: STEMI, acute inf. wall with 100% RCA stenosis, DES 07/07/11 (prior Anterior STEMI in 2001) I really think probably his third STEMI the head when I first met him really to control his whole psyche & emotional well-being. He really has not been himself since that episode. He is gradually getting better. Probably the most notable decline in function he had though was last year when we finally to the Cath Lab in good PCI of his LAD. He is  gradually getting back to his usual health, but probably will never get back to where he was before that last MI. A mildly reduced EF with pretty much RCA distribution hypokinesis.   ST elevation myocardial infarction (STEMI) of anterior wall -- PCI to LAD Despite having an anterior MI, as well as significant with restenosis of his LAD stents, he is anterior wall seems to be moving well.   CAD S/P percutaneous coronary angioplasty He now has multiple stents, pretty much in all major distributions. He is maintained on aspirin plus Effient. I have suggested that he could probably stop the aspirin if he has any GERD symptoms. He is taking a statin, nitrate along with his beta blocker which he seems to be tolerating well at the 24-hour dose. He is not as nearly as much angina symptoms on Ranexa.    Stable angina Reviewing notable improvement since last PCI. I think the combination of the distal RCA with the Paxil when his disease probably did cause angina.  That would argue against the FFR results done earlier that year, however he never really felt better after the LAD PCI until again the RCA PCI. Continue on Ranexa as well as Imdur which he is not tolerating. As long as he is having to take aspirin before his injury, he probably will keep taking it.   Hyperlipidemia with target LDL less than 70; by his report he is back on Crestor He is back on Crestor.  Results are monitored by his PCP Lab Results  Component Value Date   CHOL 139 12/31/2013   HDL 30.70* 12/31/2013   LDLCALC 62 07/26/2013   LDLDIRECT 72.7 12/31/2013   TRIG 242.0* 12/31/2013   CHOLHDL 5 12/31/2013   Overall his vitals were pretty well controlled. Essentially at goal. Consider checking NMR for next followup labs.    Essential hypertension Well-controlled on current regimen. Medical management labs monitored by PCP.   Chronotropic incompetence with left ventricular dysfunction --> improved with reduction of beta  blocker As I mentioned earlier, this is a straight sure density became tachycardic last saw her and responded well to beta blocker. He is no longer really noting problems with difficulty increasing his heart rate. For now he is doing well tolerating 100 mg of Toprol.   Chronic combined systolic  and diastolic congestive heart failure, NYHA class 2 I think this may be reason for his chronic dyspnea on exertion. He is tolerating his afternoon while and is less tired in the evening. He just is tired by the end of the day. He is on good dose of beta blocker as well as ARB with excellent control blood pressure. He has a standing dose of Lasix, but is not taking additional dose.   Former heavy tobacco smoker Smoking cessation instruction/counseling given:  commended patient for quitting and reviewed strategies for preventing relapses   COPD (chronic obstructive pulmonary disease) This isvomiting either by his primary care provider.     No orders of the defined types were placed in this encounter.   No orders of the defined types were placed in this encounter.    Followup: 6 months   HARDING, Leonie Green, M.D., M.S. Interventional Cardiologist   Pager # [phone]

## 2014-03-15 NOTE — Assessment & Plan Note (Signed)
Well-controlled on current regimen. Medical management labs monitored by PCP.

## 2014-03-15 NOTE — Assessment & Plan Note (Signed)
He now has multiple stents, pretty much in all major distributions. He is maintained on aspirin plus Effient. I have suggested that he could probably stop the aspirin if he has any GERD symptoms. He is taking a statin, nitrate along with his beta blocker which he seems to be tolerating well at the 24-hour dose. He is not as nearly as much angina symptoms on Ranexa.

## 2014-03-15 NOTE — Assessment & Plan Note (Signed)
I think this may be reason for his chronic dyspnea on exertion. He is tolerating his afternoon while and is less tired in the evening. He just is tired by the end of the day. He is on good dose of beta blocker as well as ARB with excellent control blood pressure. He has a standing dose of Lasix, but is not taking additional dose.

## 2014-03-15 NOTE — Assessment & Plan Note (Signed)
This isvomiting either by his primary care provider.

## 2014-03-15 NOTE — Assessment & Plan Note (Signed)
Smoking cessation instruction/counseling given:  commended patient for quitting and reviewed strategies for preventing relapses 

## 2014-03-15 NOTE — Assessment & Plan Note (Signed)
I really think probably his third STEMI the head when I first met him really to control his whole psyche & emotional well-being. He really has not been himself since that episode. He is gradually getting better. Probably the most notable decline in function he had though was last year when we finally to the Cath Lab in good PCI of his LAD. He is gradually getting back to his usual health, but probably will never get back to where he was before that last MI. A mildly reduced EF with pretty much RCA distribution hypokinesis.

## 2014-03-15 NOTE — Assessment & Plan Note (Signed)
Reviewing notable improvement since last PCI. I think the combination of the distal RCA with the Paxil when his disease probably did cause angina.  That would argue against the FFR results done earlier that year, however he never really felt better after the LAD PCI until again the RCA PCI. Continue on Ranexa as well as Imdur which he is not tolerating. As long as he is having to take aspirin before his injury, he probably will keep taking it.

## 2014-03-15 NOTE — Assessment & Plan Note (Signed)
As I mentioned earlier, this is a straight sure density became tachycardic last saw her and responded well to beta blocker. He is no longer really noting problems with difficulty increasing his heart rate. For now he is doing well tolerating 100 mg of Toprol.

## 2014-03-18 ENCOUNTER — Encounter (HOSPITAL_COMMUNITY): Payer: BC Managed Care – PPO

## 2014-03-20 ENCOUNTER — Encounter (HOSPITAL_COMMUNITY): Payer: BC Managed Care – PPO

## 2014-03-22 ENCOUNTER — Encounter (HOSPITAL_COMMUNITY): Payer: BC Managed Care – PPO

## 2014-03-25 ENCOUNTER — Encounter (HOSPITAL_COMMUNITY): Payer: BC Managed Care – PPO

## 2014-04-12 ENCOUNTER — Ambulatory Visit: Payer: Self-pay | Admitting: Gastroenterology

## 2014-06-05 ENCOUNTER — Encounter: Payer: Self-pay | Admitting: Internal Medicine

## 2014-06-13 ENCOUNTER — Telehealth: Payer: Self-pay | Admitting: Cardiology

## 2014-06-13 NOTE — Telephone Encounter (Signed)
Pt called in stating that he has been having trouble with getting his prescriptions refilled since he has switched insurances. He states that he now needs a prior authorization for his Effient and would like to speak with a nurse about this. Please call  Thanks

## 2014-06-14 NOTE — Telephone Encounter (Signed)
[name]   HE STATES PHARMACY INFORMED HE NEEDS A PRIOR AUTHORIZATION FOR EFFIENT RN INFORMED PATIENT OFFICE HAS NOT RECEIVED FORM, WILL CALL [facility].  RN SPOKE TO EMMA, SHE WILL FAX FORM ,SHE STATES IT HAS BEEN GOING TO DR Ahmed Prima. PATIENT WILL CALL PHARMACY AND SEE IF CAN PURCHASE 2-3 TABLETS UNTIL Monday.  WILL NOTIFY PATIENT WHEN RESOLVED

## 2014-06-17 ENCOUNTER — Telehealth: Payer: Self-pay

## 2014-06-17 MED ORDER — PRASUGREL HCL 10 MG PO TABS: 10.0000 mg | ORAL_TABLET | Freq: Every day | ORAL | Status: DC

## 2014-06-17 NOTE — Telephone Encounter (Signed)
Prior auth request initiated for Effient 10mg  thru Cover My Meds.

## 2014-06-17 NOTE — Telephone Encounter (Signed)
STARTED PRIOR AUTHORIZATION ON COVER MY MED FOR EFFIENT FAXED-  KEY [id number]

## 2014-06-17 NOTE — Telephone Encounter (Signed)
Called informed patient awaiting prior authorization  2 week samples available for pick up , patient aware.

## 2014-06-18 ENCOUNTER — Telehealth: Payer: Self-pay

## 2014-06-18 NOTE — Telephone Encounter (Signed)
Pt would like Effient samples.

## 2014-06-18 NOTE — Telephone Encounter (Signed)
Notified patient samples available of effient 75 mg

## 2014-06-20 NOTE — Telephone Encounter (Signed)
Spoke to Karna Christmas - prior authorization approved  06/17/2014-06/17/2015.

## 2014-06-20 NOTE — Telephone Encounter (Signed)
Notified patient and pharmacy. Patient verbalized understanding.

## 2014-07-01 ENCOUNTER — Encounter: Payer: Self-pay | Admitting: Internal Medicine

## 2014-07-01 ENCOUNTER — Ambulatory Visit (INDEPENDENT_AMBULATORY_CARE_PROVIDER_SITE_OTHER): Payer: 59 | Admitting: Internal Medicine

## 2014-07-01 ENCOUNTER — Telehealth: Payer: Self-pay

## 2014-07-01 VITALS — BP 124/86 | HR 77 | Temp 98.1°F | Wt 188.0 lb

## 2014-07-01 DIAGNOSIS — I5042 Chronic combined systolic (congestive) and diastolic (congestive) heart failure: Secondary | ICD-10-CM

## 2014-07-01 DIAGNOSIS — I10 Essential (primary) hypertension: Secondary | ICD-10-CM

## 2014-07-01 DIAGNOSIS — G4733 Obstructive sleep apnea (adult) (pediatric): Secondary | ICD-10-CM

## 2014-07-01 DIAGNOSIS — J302 Other seasonal allergic rhinitis: Secondary | ICD-10-CM

## 2014-07-01 DIAGNOSIS — E785 Hyperlipidemia, unspecified: Secondary | ICD-10-CM | POA: Diagnosis not present

## 2014-07-01 DIAGNOSIS — I2510 Atherosclerotic heart disease of native coronary artery without angina pectoris: Secondary | ICD-10-CM

## 2014-07-01 DIAGNOSIS — Z9861 Coronary angioplasty status: Secondary | ICD-10-CM

## 2014-07-01 DIAGNOSIS — L309 Dermatitis, unspecified: Secondary | ICD-10-CM

## 2014-07-01 DIAGNOSIS — J449 Chronic obstructive pulmonary disease, unspecified: Secondary | ICD-10-CM

## 2014-07-01 DIAGNOSIS — E119 Type 2 diabetes mellitus without complications: Secondary | ICD-10-CM

## 2014-07-01 LAB — COMPREHENSIVE METABOLIC PANEL
ALT: 23 U/L (ref 0–53)
AST: 20 U/L (ref 0–37)
Albumin: 3.9 g/dL (ref 3.5–5.2)
Alkaline Phosphatase: 118 U/L — ABNORMAL HIGH (ref 39–117)
BUN: 15 mg/dL (ref 6–23)
CO2: 22 mEq/L (ref 19–32)
Calcium: 9.2 mg/dL (ref 8.4–10.5)
Chloride: 106 mEq/L (ref 96–112)
Creatinine, Ser: 0.78 mg/dL (ref 0.40–1.50)
GFR: 110.19 mL/min (ref >60.00)
Glucose, Bld: 113 mg/dL — ABNORMAL HIGH (ref 70–99)
Potassium: 4.4 mEq/L (ref 3.5–5.1)
Sodium: 134 mEq/L — ABNORMAL LOW (ref 135–145)
Total Bilirubin: 0.4 mg/dL (ref 0.2–1.2)
Total Protein: 6.9 g/dL (ref 6.0–8.3)

## 2014-07-01 LAB — LIPID PANEL
Cholesterol: 148 mg/dL (ref 0–200)
HDL: 30.6 mg/dL — ABNORMAL LOW (ref >39.00)
NonHDL: 117.4
Total CHOL/HDL Ratio: 5
Triglycerides: 369 mg/dL — ABNORMAL HIGH (ref 0.0–149.0)
VLDL: 73.8 mg/dL — ABNORMAL HIGH (ref 0.0–40.0)

## 2014-07-01 LAB — MICROALBUMIN / CREATININE URINE RATIO
Creatinine,U: 113.4 mg/dL
Microalb Creat Ratio: 0.6 mg/g (ref 0.0–30.0)
Microalb, Ur: 0.7 mg/dL (ref 0.0–1.9)

## 2014-07-01 LAB — HEMOGLOBIN A1C: Hgb A1c MFr Bld: 5.7 % (ref 4.6–6.5)

## 2014-07-01 LAB — LDL CHOLESTEROL, DIRECT: Direct LDL: 79 mg/dL

## 2014-07-01 MED ORDER — ALBUTEROL SULFATE HFA 108 (90 BASE) MCG/ACT IN AERS: 1.0000 | INHALATION_SPRAY | Freq: Four times a day (QID) | RESPIRATORY_TRACT | Status: DC | PRN

## 2014-07-01 MED ORDER — LEVOCETIRIZINE DIHYDROCHLORIDE 5 MG PO TABS: 5.0000 mg | ORAL_TABLET | Freq: Every evening | ORAL | Status: DC

## 2014-07-01 MED ORDER — CLOBETASOL PROPIONATE 0.05 % EX CREA: 1.0000 "application " | TOPICAL_CREAM | Freq: Two times a day (BID) | CUTANEOUS | Status: DC

## 2014-07-01 NOTE — Assessment & Plan Note (Signed)
Did have a recent bout of chest pain but I thinks this is anxiety related Advised him to continue Losartan, Metoprolol, Imdur, Effient and Ranexa Will check LDL and CMET today

## 2014-07-01 NOTE — Progress Notes (Signed)
HPI  Pt presents to the clinic today to for 6 month follow up of chronic conditions.  HTN: BP well controlled. BP today is 124/86. He is on Cozaar. Last BMET 12/2013 reviewed- normal BUN, creatinine, potassium.  HLD: Last lipid profile 12/2013 reviewed. LDL was 72, controlled on Crestor and Niacin. Denies myalgias. He does feel like his right leg has been giving out on him, but this is intermittent and he does not think it is related to the medications.  CAD with angina s/p stent placement: Followed by cardiology. Currently stable on Crestor, Losartan, Metoprolol, Ranexa, Imdur and Effient. Has not had to use nitroglycerin yet. He does report one episode of chest pain. This occurred 2 weeks ago. He did not have any dizziness, sweating or shortness of breath with it. He was very stressed out and thinks it may have been anxiety related.  Heart failure: Combined, but no issues on lasix.  Sleep apnea: No longer using CPAP because every morning when he wakes up, it is off. He reports he must be pulling it off in his sleep.  COPD: Follows with pulmonology. Uses albuterol prn. He does need a refill of the Albuterol today. Used to be on Advair but did not like it, so stopped. Quit smoking 06/2013.  DM2: His fasting sugars are in the low 100'2.Marland Kitchen Not medicated. Last A1C 12/2013- 6.4%. He has cut back on his soda and sweets. He has not had an eye exam in 15 years. Flu and Prevnar UTD.  Allergic Rhinitis: He reports OTC medications do seem to work but he does not like to take them because he is not sure if they will interact with his other medications. His symptoms are worse in the spring, especially after it rains. He would like a RX for his allergies today.  Rash: This is located on his upper back. It has been there for many years. It started out small and then got bigger. It does itch. He has tried Triamcinolone cream without much relief. He denies changes in soap, lotions or detergent.  Past Medical  History  Diagnosis Date  . Essential hypertension 07/07/2011  . History of colon polyps   . Hyperlipidemia with target LDL less than 70 07/07/2011  . Recurrent upper respiratory infection (URI)   . CAD S/P percutaneous coronary angioplasty 07/07/2011; 6 & 10 2015    S/P PCI to all 3 major vessels;a)  Ant STEMI 2001- BMS-LAD x2 -->(redo PCI 6/'15 -- pLAD Xience DES 2.5 x 12- 2.75 mm, mLAD 2.25 x 12 - 2.7 mm), b) '06 UA --> Cx- OM DES; c) 2013 Inf MI BMS mRCA --> d) 10/'15 PCI dRCA Promus P DES 4.0 x 16 (4.25 mm); PTCA of RPL2 (2.0 mm) &RPDA (2.25 mm) 11/2013  . ST elevation myocardial infarction (STEMI) of anterior wall 2001    History of -- 2 stents in early and distal mid LAD; prior cardiologist was Dr. Remi Haggard in [place]  . ST elevation myocardial infarction (STEMI) of inferior wall 07/07/2011    Occluded RCA 2.75X18 INTEGRITY; Echo 07/2013: EF 45-50%, Inferior & Posterolateral HK.   Marland Kitchen Unstable angina  2006; June 2015    Cx-OM - PCI 2.5 mm 13 mm Cypher DES [name], [place]) ; 07/2013: Severe ISR of both prox & Distal LAD stentS --> 2 DES stents (1 at prox edge of the proximal stent, 2nd covers the entire distal stent as well as proximal and distal edge stenose)   . COPD (chronic obstructive pulmonary disease)   .  Former heavy tobacco smoker      quit in May 2015 after multiple attempts at trying to quit before   . Glucose intolerance (pre-diabetes) June 2015     hemoglobin A1c 6.6  . Metabolic syndrome     Pre-diabetes, hypertension and truncal obesity as well as dyslipidemia  . Upper respiratory infection   . Heart murmur   . Pneumonia "several times"  . Chronic bronchitis "used to get it q yr"  . OSA on CPAP   . GERD (gastroesophageal reflux disease)   . History of stomach ulcers   . Headache     "Imdur related; stopped taking it; headaches went away" (11/15/2013)  . Seizures "several"    "last one was ~ 2011" (11/15/2013)    Current Outpatient Prescriptions  Medication Sig  Dispense Refill  . aspirin EC 81 MG tablet Take 162 mg by mouth at bedtime.    Marland Kitchen buPROPion (WELLBUTRIN XL) 150 MG 24 hr tablet Take 150 mg by mouth daily.    Marland Kitchen dexlansoprazole (DEXILANT) 60 MG capsule Take 60 mg by mouth daily.    . furosemide (LASIX) 20 MG tablet Take 1 tablet (20 mg total) by mouth daily. 30 tablet 5  . isosorbide mononitrate (IMDUR) 30 MG 24 hr tablet Take 1 tablet (30 mg total) by mouth daily. 30 tablet 6  . losartan (COZAAR) 25 MG tablet TAKE 1 TABLET (25 MG TOTAL) BY MOUTH DAILY. 30 tablet 2  . metoprolol succinate (TOPROL-XL) 100 MG 24 hr tablet Take 1 tablet (100 mg total) by mouth daily. Take with or immediately following a meal. 30 tablet 6  . niacin (NIASPAN) 500 MG CR tablet Take 500 mg by mouth at bedtime. Takes 30 minutes after taking aspirin.    . nitroGLYCERIN (NITROSTAT) 0.4 MG SL tablet Place 1 tablet (0.4 mg total) under the tongue every 5 (five) minutes as needed for chest pain. 25 tablet 3  . prasugrel (EFFIENT) 10 MG TABS tablet Take 1 tablet (10 mg total) by mouth daily. 14 tablet 0  . PROVENTIL HFA 108 (90 BASE) MCG/ACT inhaler Inhale 1 puff into the lungs every 6 (six) hours as needed.     . ranolazine (RANEXA) 500 MG 12 hr tablet Take 1 tablet (500 mg total) by mouth 2 (two) times daily. 60 tablet 6  . rosuvastatin (CRESTOR) 20 MG tablet Take 1 tablet (20 mg total) by mouth daily at 6 PM. 30 tablet 6   No current facility-administered medications for this visit.    Allergies  Allergen Reactions  . Niacin And Related Hives and Itching    Family History  Problem Relation Age of Onset  . Coronary artery disease Father   . Heart disease Father   . Stroke Father   . Hypertension Father   . Hyperlipidemia Mother   . Diabetes Mother   . Hyperlipidemia Maternal Grandmother   . Diabetes Maternal Grandmother   . Hyperlipidemia Maternal Grandfather   . Diabetes Maternal Grandfather   . Heart disease Paternal Grandmother   . Heart disease Paternal  Grandfather   . Cancer Maternal Aunt     History   Social History  . Marital Status: Married    Spouse Name: N/A  . Number of Children: N/A  . Years of Education: N/A   Occupational History  . Not on file.   Social History Main Topics  . Smoking status: Former Smoker -- 0.50 packs/day for 40 years    Types: Cigarettes    Quit  date: 06/19/2013  . Smokeless tobacco: Never Used     Comment: Quit with Bupropion 150mg   . Alcohol Use: 0.6 oz/week    1 Cans of beer per week     Comment: rare  . Drug Use: No  . Sexual Activity: Yes   Other Topics Concern  . Not on file   Social History Narrative   He is a married father of 11, grandfather of 11. He does not really get routine exercise.    He is down to 5 or 6 cigarettes a day. He is very seriously wanting to quit. This is a significant cutback for him, but he has pretty much been told he needs to stop by his wife, and so he fully intends to do so. He is willing to try the patches or whatever. He has a social alcoholic beverage every now and then.     ROS:  Constitutional: Pt reports fatigue.Denies fever, malaise, headache or abrupt weight changes.  HEENT: Pt reports runny nose. Denies ear pain, nasal congestion or sore throat. Respiratory: Denies difficulty breathing, shortness of breath, cough or sputum production.   Cardiovascular: Pt reports chest pain. Denies chest tightness, palpitations or swelling in the hands or feet.  Gastrointestinal: Denies abdominal pain, bloating, constipation, diarrhea or blood in the stool.  Skin: Pt reports rash. Denies redness, lesions or ulcercations.  MSK: Pt reports his right leg gives out on him. Denies decrease in ROM, joint pain and swelling. Neurological: Denies dizziness, difficulty with memory, difficulty with speech or problems with balance and coordination.   No other specific complaints in a complete review of systems (except as listed in HPI above).  PE:  BP 124/86 mmHg  Pulse 77   Temp(Src) 98.1 F (36.7 C) (Oral)  Wt 188 lb (85.276 kg)  SpO2 97% Wt Readings from Last 3 Encounters:  07/01/14 188 lb (85.276 kg)  03/13/14 184 lb (83.462 kg)  12/31/13 188 lb (85.276 kg)    General: Appears his stated age, well developed, well nourished in NAD. Skin: Scaly, maculopapular rash noted on bilateral upper back. Resembles eczema. HEENT: Head: normal shape and size; Eyes: sclera white, no icterus, conjunctiva pink, PERRLA and EOMs intact; Nose: mucosa boggy and moist, septum midline.  Cardiovascular: Normal rate and rhythm. Distant S1,S2 noted.  No murmur, rubs or gallops noted.  Pulmonary/Chest: Normal effort and diminished vesicular breath sounds. No respiratory distress. No wheezes, rales or ronchi noted.  Abd: Soft, nontender, active BS. No distension or masses noted. MSK: Normal flexion and extension of right knee. No pain with palpation of the medial or lateral joint lines. Negative drawer test. Strength 5/5 BLE. Neurological: Alert and oriented.   BMET    Component Value Date/Time   NA 140 12/31/2013 1123   NA 142 11/07/2013 1509   NA 139 10/24/2013 1050   K 4.4 12/31/2013 1123   K 3.8 10/24/2013 1050   CL 106 12/31/2013 1123   CL 107 10/24/2013 1050   CO2 23 12/31/2013 1123   CO2 24 10/24/2013 1050   GLUCOSE 96 12/31/2013 1123   GLUCOSE 112* 11/07/2013 1509   GLUCOSE 108* 10/24/2013 1050   BUN 12 12/31/2013 1123   BUN 13 11/07/2013 1509   BUN 8 10/24/2013 1050   CREATININE 0.9 12/31/2013 1123   CREATININE 0.95 10/24/2013 1050   CREATININE 0.89 07/26/2013 0923   CALCIUM 9.3 12/31/2013 1123   CALCIUM 8.2* 10/24/2013 1050   GFRNONAA 83* 11/16/2013 0359   GFRNONAA >60 10/24/2013 1050  GFRAA >90 11/16/2013 0359   GFRAA >60 10/24/2013 1050    Lipid Panel     Component Value Date/Time   CHOL 139 12/31/2013 1123   TRIG 242.0* 12/31/2013 1123   HDL 30.70* 12/31/2013 1123   CHOLHDL 5 12/31/2013 1123   VLDL 48.4* 12/31/2013 1123   LDLCALC 62  07/26/2013 0923    CBC    Component Value Date/Time   WBC 12.3* 12/31/2013 1123   WBC 11.8* 11/07/2013 1509   WBC 22.2* 10/24/2013 1050   RBC 5.66 12/31/2013 1123   RBC 5.78 11/07/2013 1509   RBC 5.58 10/24/2013 1050   HGB 16.2 12/31/2013 1123   HGB 15.9 10/24/2013 1050   HCT 49.3 12/31/2013 1123   HCT 49.4 10/24/2013 1050   PLT 398.0 12/31/2013 1123   PLT 388 10/24/2013 1050   MCV 87.1 12/31/2013 1123   MCV 89 10/24/2013 1050   MCH 28.7 11/16/2013 0359   MCH 29.2 11/07/2013 1509   MCH 28.5 10/24/2013 1050   MCHC 32.9 12/31/2013 1123   MCHC 34.6 11/07/2013 1509   MCHC 32.2 10/24/2013 1050   RDW 15.1 12/31/2013 1123   RDW 14.0 11/07/2013 1509   RDW 14.5 10/24/2013 1050   LYMPHSABS 2.8 11/07/2013 1509   EOSABS 0.2 11/07/2013 1509   BASOSABS 0.1 11/07/2013 1509    Hgb A1C Lab Results  Component Value Date   HGBA1C 6.4 12/31/2013     Assessment and Plan:  Eczema:  eRx for Clobetasol cream Use an intensive moisturizer such as Eucerin or Aveeno  RTC in 6 months to follow up chronic condtions

## 2014-07-01 NOTE — Assessment & Plan Note (Signed)
Will check A1C and microalbumin today Advised him to make an appt for an eye exam Flu and Prevnar UTD Foot exam today

## 2014-07-01 NOTE — Telephone Encounter (Signed)
Pt left v/m; pt was seen earlier today and provental inhaler is too expensive(cost to pt $60.00) and is there a discount card that could assist cost of med or can a different med be substituted. Pt request cb. [employer].

## 2014-07-01 NOTE — Telephone Encounter (Signed)
No, that is the only one

## 2014-07-01 NOTE — Assessment & Plan Note (Signed)
Chronic but stable Albuterol refilled today

## 2014-07-01 NOTE — Assessment & Plan Note (Signed)
Well controlled on current meds CMET today

## 2014-07-01 NOTE — Assessment & Plan Note (Signed)
Not wearing his CPAP

## 2014-07-01 NOTE — Progress Notes (Signed)
Pre visit review using our clinic review tool, if applicable. No additional management support is needed unless otherwise documented below in the visit note. 

## 2014-07-01 NOTE — Assessment & Plan Note (Signed)
Euvolemic on exam Continue Lasix

## 2014-07-01 NOTE — Telephone Encounter (Signed)
Is there another type inhaler pt can use for same treatment--please advise as all albuterol inhalers are not on Formulary and no coupon card available--please advise

## 2014-07-01 NOTE — Patient Instructions (Signed)

## 2014-07-01 NOTE — Assessment & Plan Note (Signed)
Will check Lipid profile and CMET today Continue Crestor

## 2014-07-02 NOTE — Telephone Encounter (Signed)
Pt is aware there are no more alternatives

## 2014-07-17 ENCOUNTER — Ambulatory Visit (INDEPENDENT_AMBULATORY_CARE_PROVIDER_SITE_OTHER): Payer: 59 | Admitting: Pulmonary Disease

## 2014-07-17 ENCOUNTER — Encounter: Payer: Self-pay | Admitting: Pulmonary Disease

## 2014-07-17 ENCOUNTER — Ambulatory Visit (INDEPENDENT_AMBULATORY_CARE_PROVIDER_SITE_OTHER)
Admission: RE | Admit: 2014-07-17 | Discharge: 2014-07-17 | Disposition: A | Discharge status: Home / Self Care | Payer: 59 | Source: Ambulatory Visit | Attending: Pulmonary Disease | Admitting: Pulmonary Disease

## 2014-07-17 VITALS — BP 102/80 | HR 89 | Ht 72.0 in | Wt 182.8 lb

## 2014-07-17 DIAGNOSIS — R0789 Other chest pain: Secondary | ICD-10-CM | POA: Diagnosis not present

## 2014-07-17 DIAGNOSIS — J42 Unspecified chronic bronchitis: Secondary | ICD-10-CM

## 2014-07-17 DIAGNOSIS — G4733 Obstructive sleep apnea (adult) (pediatric): Secondary | ICD-10-CM | POA: Diagnosis not present

## 2014-07-17 NOTE — Patient Instructions (Signed)
Chest xray today  Follow up in 6 months 

## 2014-07-17 NOTE — Progress Notes (Signed)
Chief Complaint  Patient presents with  . Follow-up    Pt c/o chest/lung pain x 2 days radiating into back - worse when taking breaths or with movement. Pt states that majority of the pain has resolved. Pt states that he has minimal cough and SOB is constant.     History of Present Illness: Troy Parrish is a 54 y.o. male former smoker with OSA, and borderline COPD.  He stopped using CPAP.  He felt uncomfortable from mask.  His wife has been concerned about his sleep pattern and snoring w/o CPAP.  He developed pain in his chest about 3 days ago.  It would hurt when he takes a deep breath.  This was in the middle of his chest.  The sensation is different than what he had when he had CAD.  He denies cough, wheeze, sputum, fever, or hemoptysis.  He has been taking dexilant and this controls his reflux.  He has allergies, but these haven't been much of an issue recently.  TESTS: Echo 07/10/11 >> EF 50 to 31%, grade 1 diastolic dysfx  Spirometry 11/09/11 >> FEV1 3.26 (67%), FEV1% 67 PFT 04/11/13 >> FEV1 2.65 (69%), FEV1% 72, TLC 6.58 (94%), DLCO 70%, +BD  PSG 04/13/13 >> AHI 59.2, SpO2 low 86%. Auto CPAP 05/26/13 to 06/08/13 >> used on 14 of 14 nights with average 4 hrs 51 min.  Average AHI 1.5 with median CPAP 8 cm H2O and 95 th percentile CPAP 11 cm H2O.  PMHx >> CAD, HTN, HLD, GERD, Seizures, Allergic rhinitis  PSHx, Medications, Allergies, Fhx, Shx reviewed.  Physical Exam: Blood pressure 102/80, pulse 89, height 6' (1.829 m), weight 182 lb 12.8 oz (82.918 kg), SpO2 96 %. Body mass index is 24.79 kg/(m^2).  General - No distress ENT - No sinus tenderness, no oral exudate, no LAN, high arched palate, MP 4, enlarged tongue, poor dentition Cardiac - s1s2 regular, no murmur Chest - No wheeze/rales/dullness Back - No focal tenderness Abd - Soft, non-tender Ext - No edema Neuro - Normal strength Skin - No rashes Psych - normal mood, and behavior   Assessment/Plan:  Atypical chest  pain. Plan: - check CXR and then decide if he needs CT chest  Mild COPD. Plan: - continue prn albuterol  Obstructive sleep apnea. Plan: - he will resume using CPAP   Chesley Mires, MD [facility] Pager:  [phone]

## 2014-07-18 ENCOUNTER — Telehealth: Payer: Self-pay | Admitting: Pulmonary Disease

## 2014-07-18 NOTE — Telephone Encounter (Signed)
Dg Chest 2 View  07/18/2014   CLINICAL DATA:  Cough and congestion for 1 week  EXAM: CHEST  2 VIEW  COMPARISON:  10/24/2013  FINDINGS: Normal heart size. Coronary artery stents are noted. Clear lungs. No pneumothorax. No pleural effusion.  IMPRESSION: No active cardiopulmonary disease.   Electronically Signed   By: Marybelle Killings M.D.   On: 07/18/2014 08:00    Will have my nurse inform pt that chest xray was normal.

## 2014-07-18 NOTE — Telephone Encounter (Signed)
[phone] calling back

## 2014-07-18 NOTE — Telephone Encounter (Signed)
I spoke with patient about results and he verbalized understanding and had no questions 

## 2014-09-04 ENCOUNTER — Ambulatory Visit: Payer: Self-pay | Admitting: Cardiology

## 2014-09-09 HISTORY — PX: OTHER SURGICAL HISTORY: SHX169

## 2014-09-11 ENCOUNTER — Ambulatory Visit (INDEPENDENT_AMBULATORY_CARE_PROVIDER_SITE_OTHER): Payer: 59 | Admitting: Internal Medicine

## 2014-09-11 ENCOUNTER — Encounter: Payer: Self-pay | Admitting: Internal Medicine

## 2014-09-11 VITALS — BP 132/90 | HR 60 | Temp 98.6°F | Wt 180.2 lb

## 2014-09-11 DIAGNOSIS — J069 Acute upper respiratory infection, unspecified: Secondary | ICD-10-CM | POA: Diagnosis not present

## 2014-09-11 MED ORDER — AMOXICILLIN 875 MG PO TABS: 875.0000 mg | ORAL_TABLET | Freq: Two times a day (BID) | ORAL | Status: DC

## 2014-09-11 MED ORDER — HYDROCODONE-HOMATROPINE 5-1.5 MG/5ML PO SYRP
5.0000 mL | ORAL_SOLUTION | Freq: Three times a day (TID) | ORAL | Status: DC | PRN
Start: 2014-09-11 — End: 2015-03-04

## 2014-09-11 NOTE — Patient Instructions (Signed)
Upper Respiratory Infection, Adult An upper respiratory infection (URI) is also sometimes known as the common cold. The upper respiratory tract includes the nose, sinuses, throat, trachea, and bronchi. Bronchi are the airways leading to the lungs. Most people improve within 1 week, but symptoms can last up to 2 weeks. A residual cough may last even longer.  CAUSES Many different viruses can infect the tissues lining the upper respiratory tract. The tissues become irritated and inflamed and often become very moist. Mucus production is also common. A cold is contagious. You can easily spread the virus to others by oral contact. This includes kissing, sharing a glass, coughing, or sneezing. Touching your mouth or nose and then touching a surface, which is then touched by another person, can also spread the virus. SYMPTOMS  Symptoms typically develop 1 to 3 days after you come in contact with a cold virus. Symptoms vary from person to person. They may include:  Runny nose.  Sneezing.  Nasal congestion.  Sinus irritation.  Sore throat.  Loss of voice (laryngitis).  Cough.  Fatigue.  Muscle aches.  Loss of appetite.  Headache.  Low-grade fever. DIAGNOSIS  You might diagnose your own cold based on familiar symptoms, since most people get a cold 2 to 3 times a year. Your caregiver can confirm this based on your exam. Most importantly, your caregiver can check that your symptoms are not due to another disease such as strep throat, sinusitis, pneumonia, asthma, or epiglottitis. Blood tests, throat tests, and X-rays are not necessary to diagnose a common cold, but they may sometimes be helpful in excluding other more serious diseases. Your caregiver will decide if any further tests are required. RISKS AND COMPLICATIONS  You may be at risk for a more severe case of the common cold if you smoke cigarettes, have chronic heart disease (such as heart failure) or lung disease (such as asthma), or if  you have a weakened immune system. The very young and very old are also at risk for more serious infections. Bacterial sinusitis, middle ear infections, and bacterial pneumonia can complicate the common cold. The common cold can worsen asthma and chronic obstructive pulmonary disease (COPD). Sometimes, these complications can require emergency medical care and may be life-threatening. PREVENTION  The best way to protect against getting a cold is to practice good hygiene. Avoid oral or hand contact with people with cold symptoms. Wash your hands often if contact occurs. There is no clear evidence that vitamin C, vitamin E, echinacea, or exercise reduces the chance of developing a cold. However, it is always recommended to get plenty of rest and practice good nutrition. TREATMENT  Treatment is directed at relieving symptoms. There is no cure. Antibiotics are not effective, because the infection is caused by a virus, not by bacteria. Treatment may include:  Increased fluid intake. Sports drinks offer valuable electrolytes, sugars, and fluids.  Breathing heated mist or steam (vaporizer or shower).  Eating chicken soup or other clear broths, and maintaining good nutrition.  Getting plenty of rest.  Using gargles or lozenges for comfort.  Controlling fevers with ibuprofen or acetaminophen as directed by your caregiver.  Increasing usage of your inhaler if you have asthma. Zinc gel and zinc lozenges, taken in the first 24 hours of the common cold, can shorten the duration and lessen the severity of symptoms. Pain medicines may help with fever, muscle aches, and throat pain. A variety of non-prescription medicines are available to treat congestion and runny nose. Your caregiver   can make recommendations and may suggest nasal or lung inhalers for other symptoms.  HOME CARE INSTRUCTIONS   Only take over-the-counter or prescription medicines for pain, discomfort, or fever as directed by your  caregiver.  Use a warm mist humidifier or inhale steam from a shower to increase air moisture. This may keep secretions moist and make it easier to breathe.  Drink enough water and fluids to keep your urine clear or pale yellow.  Rest as needed.  Return to work when your temperature has returned to normal or as your caregiver advises. You may need to stay home longer to avoid infecting others. You can also use a face mask and careful hand washing to prevent spread of the virus. SEEK MEDICAL CARE IF:   After the first few days, you feel you are getting worse rather than better.  You need your caregiver's advice about medicines to control symptoms.  You develop chills, worsening shortness of breath, or brown or red sputum. These may be signs of pneumonia.  You develop yellow or brown nasal discharge or pain in the face, especially when you bend forward. These may be signs of sinusitis.  You develop a fever, swollen neck glands, pain with swallowing, or white areas in the back of your throat. These may be signs of strep throat. SEEK IMMEDIATE MEDICAL CARE IF:   You have a fever.  You develop severe or persistent headache, ear pain, sinus pain, or chest pain.  You develop wheezing, a prolonged cough, cough up blood, or have a change in your usual mucus (if you have chronic lung disease).  You develop sore muscles or a stiff neck. Document Released: 07/21/2000 Document Revised: 04/19/2011 Document Reviewed: 05/02/2013 [employer] Patient Information 2015 [employer]. This information is not intended to replace advice given to you by your health care provider. Make sure you discuss any questions you have with your health care provider.  

## 2014-09-11 NOTE — Progress Notes (Signed)
HPI  Pt presents to the clinic today with c/o runny nose, nasal congestion, cough, chills and body aches. This started 3 days ago. He is blowing clear mucous out of his nose. The cough is non productive. He denies fevers. He has tried Ibuprofen, Afrin and Alka Seltzer. He does have COPD and seasonal allergies. He has not had sick contacts that he is aware of. He no longer smoke.  Review of Systems      Past Medical History  Diagnosis Date  . Essential hypertension 07/07/2011  . History of colon polyps   . Hyperlipidemia with target LDL less than 70 07/07/2011  . Recurrent upper respiratory infection (URI)   . CAD S/P percutaneous coronary angioplasty 07/07/2011; 6 & 10 2015    S/P PCI to all 3 major vessels;a)  Ant STEMI 2001- BMS-LAD x2 -->(redo PCI 6/'15 -- pLAD Xience DES 2.5 x 12- 2.75 mm, mLAD 2.25 x 12 - 2.7 mm), b) '06 UA --> Cx- OM DES; c) 2013 Inf MI BMS mRCA --> d) 10/'15 PCI dRCA Promus P DES 4.0 x 16 (4.25 mm); PTCA of RPL2 (2.0 mm) &RPDA (2.25 mm) 11/2013  . ST elevation myocardial infarction (STEMI) of anterior wall 2001    History of -- 2 stents in early and distal mid LAD; prior cardiologist was Dr. Remi Haggard in [place]  . ST elevation myocardial infarction (STEMI) of inferior wall 07/07/2011    Occluded RCA 2.75X18 INTEGRITY; Echo 07/2013: EF 45-50%, Inferior & Posterolateral HK.   Marland Kitchen Unstable angina  2006; June 2015    Cx-OM - PCI 2.5 mm 13 mm Cypher DES [name], [employer]) ; 07/2013: Severe ISR of both prox & Distal LAD stentS --> 2 DES stents (1 at prox edge of the proximal stent, 2nd covers the entire distal stent as well as proximal and distal edge stenose)   . COPD (chronic obstructive pulmonary disease)   . Former heavy tobacco smoker      quit in May 2015 after multiple attempts at trying to quit before   . Glucose intolerance (pre-diabetes) June 2015     hemoglobin A1c 6.6  . Metabolic syndrome     Pre-diabetes, hypertension and truncal obesity as well as  dyslipidemia  . Upper respiratory infection   . Heart murmur   . Pneumonia "several times"  . Chronic bronchitis "used to get it q yr"  . OSA on CPAP   . GERD (gastroesophageal reflux disease)   . History of stomach ulcers   . Headache     "Imdur related; stopped taking it; headaches went away" (11/15/2013)  . Seizures "several"    "last one was ~ 2011" (11/15/2013)    Family History  Problem Relation Age of Onset  . Coronary artery disease Father   . Heart disease Father   . Stroke Father   . Hypertension Father   . Hyperlipidemia Mother   . Diabetes Mother   . Hyperlipidemia Maternal Grandmother   . Diabetes Maternal Grandmother   . Hyperlipidemia Maternal Grandfather   . Diabetes Maternal Grandfather   . Heart disease Paternal Grandmother   . Heart disease Paternal Grandfather   . Cancer Maternal Aunt     History   Social History  . Marital Status: Married    Spouse Name: N/A  . Number of Children: N/A  . Years of Education: N/A   Occupational History  . Not on file.   Social History Main Topics  . Smoking status: Former Smoker -- 0.50 packs/day  for 40 years    Types: Cigarettes    Quit date: 06/19/2013  . Smokeless tobacco: Never Used     Comment: Quit with Bupropion 150mg   . Alcohol Use: 0.6 oz/week    1 Cans of beer per week     Comment: rare  . Drug Use: No  . Sexual Activity: Yes   Other Topics Concern  . Not on file   Social History Narrative   He is a married father of 50, grandfather of 62. He does not really get routine exercise.    He is down to 5 or 6 cigarettes a day. He is very seriously wanting to quit. This is a significant cutback for him, but he has pretty much been told he needs to stop by his wife, and so he fully intends to do so. He is willing to try the patches or whatever. He has a social alcoholic beverage every now and then.     Allergies  Allergen Reactions  . Niacin And Related Hives and Itching     Constitutional: Positive  headache, fatigue. Denies fever or abrupt weight changes.  HEENT:  Positive runny nose, nasal congestion, sore throat. Denies eye redness, eye pain, pressure behind the eyes, facial pain, ear pain, ringing in the ears, wax buildup, or bloody nose. Respiratory: Positive cough. Denies difficulty breathing or shortness of breath.  Cardiovascular: Denies chest pain, chest tightness, palpitations or swelling in the hands or feet.   No other specific complaints in a complete review of systems (except as listed in HPI above).  Objective:   BP 132/90 mmHg  Pulse 60  Temp(Src) 98.6 F (37 C) (Oral)  Wt 180 lb 4 oz (81.761 kg) Wt Readings from Last 3 Encounters:  09/11/14 180 lb 4 oz (81.761 kg)  07/17/14 182 lb 12.8 oz (82.918 kg)  07/01/14 188 lb (85.276 kg)     General: Appears his stated age, ill appearing in NAD. HEENT: Head: normal shape and size, no sinus tenderness; Eyes: sclera white, no icterus, conjunctiva pink, PERRLA and EOMs intact; Ears: Tm's gray and intact, normal light reflex, cerumen impaction on the left; Nose: mucosa pink and moist, septum midline; Throat/Mouth: + PND. Teeth present, mucosa erythematous and moist, no exudate noted, no lesions or ulcerations noted.  Neck: no cervical lymphadenopathy.  Cardiovascular: Normal rate and rhythm. S1,S2 noted.  No murmur, rubs or gallops noted. Pulmonary/Chest: Normal effort and positive vesicular breath sounds. No respiratory distress. No wheezes, rales or ronchi noted.      Assessment & Plan:   Upper Respiratory Infection:  Get some rest and drink plenty of water Do salt water gargles for the sore throat Given Comorbidities, eRx for Amoxicillin 875 mg BID x 10 days Rx for Hycodan cough syrup  RTC as needed or if symptoms persist.

## 2014-09-11 NOTE — Progress Notes (Signed)
Pre visit review using our clinic review tool, if applicable. No additional management support is needed unless otherwise documented below in the visit note. 

## 2014-10-10 HISTORY — PX: OTHER SURGICAL HISTORY: SHX169

## 2014-10-21 ENCOUNTER — Other Ambulatory Visit: Payer: Self-pay | Admitting: Physician Assistant

## 2014-11-25 ENCOUNTER — Telehealth: Payer: Self-pay | Admitting: Internal Medicine

## 2014-11-25 NOTE — Telephone Encounter (Signed)
Pt called. He would like to get his flu shot. He also would like two other vaccines that he states he is not up to date on at the same time. He does not know the names of the vaccines. Please advise

## 2014-11-26 ENCOUNTER — Ambulatory Visit: Payer: Self-pay

## 2014-11-26 NOTE — Telephone Encounter (Signed)
Per verbal ok from [facility] will need Prevnar--unsure on TDAP but flu and Prevnar are priority--pt has been scheduled on nurse and flu schedule for 11/29/2014

## 2014-11-29 ENCOUNTER — Ambulatory Visit: Payer: 59

## 2014-11-29 ENCOUNTER — Ambulatory Visit (INDEPENDENT_AMBULATORY_CARE_PROVIDER_SITE_OTHER): Payer: 59

## 2014-11-29 DIAGNOSIS — Z23 Encounter for immunization: Secondary | ICD-10-CM

## 2014-11-29 NOTE — Addendum Note (Signed)
Addended by: Royann Shivers A on: 11/29/2014 10:14 AM   Modules accepted: Orders

## 2014-12-05 ENCOUNTER — Other Ambulatory Visit: Payer: Self-pay | Admitting: Physician Assistant

## 2014-12-05 ENCOUNTER — Other Ambulatory Visit: Payer: Self-pay | Admitting: Cardiology

## 2014-12-09 ENCOUNTER — Other Ambulatory Visit: Payer: Self-pay | Admitting: Internal Medicine

## 2014-12-23 ENCOUNTER — Telehealth: Payer: Self-pay | Admitting: Cardiology

## 2014-12-23 NOTE — Telephone Encounter (Signed)
Patient not interesting in scheduling appt from recall list.  Patient will call office when he gets his new insurance.    Deleting recall.

## 2014-12-30 ENCOUNTER — Ambulatory Visit: Payer: 59 | Admitting: Internal Medicine

## 2014-12-30 ENCOUNTER — Telehealth: Payer: Self-pay | Admitting: Internal Medicine

## 2014-12-30 DIAGNOSIS — Z0289 Encounter for other administrative examinations: Secondary | ICD-10-CM

## 2014-12-30 NOTE — Telephone Encounter (Signed)
Pt did not come in for their appt today for follow up. Please let me know if pt needs to be contacted immediately for follow up or no follow up needed. Best phone number to contact pt is [phone].

## 2014-12-30 NOTE — Telephone Encounter (Signed)
Yes he needs to follow up for his appt

## 2015-01-01 ENCOUNTER — Encounter: Payer: Self-pay | Admitting: Internal Medicine

## 2015-01-01 NOTE — Telephone Encounter (Signed)
Pt called back stating his wife does not currently have insurance until January 2017. He will call back at that time and reschedule.

## 2015-01-01 NOTE — Telephone Encounter (Signed)
Left voicemail to call back and reschedule appt.

## 2015-01-20 ENCOUNTER — Telehealth: Payer: Self-pay

## 2015-01-20 NOTE — Telephone Encounter (Signed)
LVM for pt to return my call to schedule repeat colonoscopy. Dr. Allen Norris was unable to remove polyps in March due to pt being on a blood thinner.

## 2015-01-20 NOTE — Telephone Encounter (Signed)
-----   Message from [name] sent at 07/29/2014  2:44 PM EDT ----- Regarding: colonoscopy repeat Sent: 01/19/2015  Phone Encounter: 05/24/2014  Pt needs repeat colonoscopy due to pt being on anticoagulation therapy at the time of last colonoscopy/EGD. Pt had multiple polyps and mucosal ulceration. Last colonoscopy on 04-12-14. GF

## 2015-01-20 NOTE — Telephone Encounter (Signed)
Pt returned call and informed me about the insurance issue. Pt is suppose to be getting new insurance in Jan 2017.  I will put pt back on recall list and call in 1 month to see if the insurance issue has been resolved.

## 2015-03-04 ENCOUNTER — Encounter: Payer: Self-pay | Admitting: Internal Medicine

## 2015-03-04 ENCOUNTER — Ambulatory Visit (INDEPENDENT_AMBULATORY_CARE_PROVIDER_SITE_OTHER): Payer: 59 | Admitting: Internal Medicine

## 2015-03-04 ENCOUNTER — Telehealth: Payer: Self-pay | Admitting: Gastroenterology

## 2015-03-04 VITALS — BP 138/96 | HR 77 | Temp 98.2°F | Ht 71.0 in | Wt 176.0 lb

## 2015-03-04 DIAGNOSIS — J302 Other seasonal allergic rhinitis: Secondary | ICD-10-CM | POA: Diagnosis not present

## 2015-03-04 DIAGNOSIS — R42 Dizziness and giddiness: Secondary | ICD-10-CM

## 2015-03-04 DIAGNOSIS — K429 Umbilical hernia without obstruction or gangrene: Secondary | ICD-10-CM | POA: Diagnosis not present

## 2015-03-04 DIAGNOSIS — Z0000 Encounter for general adult medical examination without abnormal findings: Secondary | ICD-10-CM

## 2015-03-04 DIAGNOSIS — Z23 Encounter for immunization: Secondary | ICD-10-CM | POA: Diagnosis not present

## 2015-03-04 DIAGNOSIS — H6123 Impacted cerumen, bilateral: Secondary | ICD-10-CM

## 2015-03-04 NOTE — Progress Notes (Signed)
Subjective:    Patient ID: Troy Parrish, male    DOB: [birth year], 55 y.o.   MRN: [id number]  HPI  Pt presents to the clinic today for his annual exam.  Flu: 11/2014 Tetanus: nonsure Pneumovax: 07/2011 Prevnar: 11/2014 PSA Screening: 12/2013 Colon Screening: 04/2014 Vision Screening: 01/2015 Dentist: as needed  Diet: He does eat meat. He eats fruits and veggies daily. He does consumes fried foods. He drinks mostly water. Exercise: None  He does report worsening allergies. He feels like the Xyzal is not effective. He constantly has runny nose and nasal congestion. He c/o ear fullness and intermittent dizziness. He has no idea what he is allergic to. He is requesting a referral to an allergist.   Review of Systems      Past Medical History  Diagnosis Date  . Essential hypertension 07/07/2011  . History of colon polyps   . Hyperlipidemia with target LDL less than 70 07/07/2011  . Recurrent upper respiratory infection (URI)   . CAD S/P percutaneous coronary angioplasty 07/07/2011; 6 & 10 2015    S/P PCI to all 3 major vessels;a)  Ant STEMI 2001- BMS-LAD x2 -->(redo PCI 6/'15 -- pLAD Xience DES 2.5 x 12- 2.75 mm, mLAD 2.25 x 12 - 2.7 mm), b) '06 UA --> Cx- OM DES; c) 2013 Inf MI BMS mRCA --> d) 10/'15 PCI dRCA Promus P DES 4.0 x 16 (4.25 mm); PTCA of RPL2 (2.0 mm) &RPDA (2.25 mm) 11/2013  . ST elevation myocardial infarction (STEMI) of anterior wall 2001    History of -- 2 stents in early and distal mid LAD; prior cardiologist was Dr. Remi Haggard in [place]  . ST elevation myocardial infarction (STEMI) of inferior wall 07/07/2011    Occluded RCA 2.75X18 INTEGRITY; Echo 07/2013: EF 45-50%, Inferior & Posterolateral HK.   Marland Kitchen Unstable angina  2006; June 2015    Cx-OM - PCI 2.5 mm 13 mm Cypher DES [name], [place]) ; 07/2013: Severe ISR of both prox & Distal LAD stentS --> 2 DES stents (1 at prox edge of the proximal stent, 2nd covers the entire distal stent as well as proximal and distal  edge stenose)   . COPD (chronic obstructive pulmonary disease)   . Former heavy tobacco smoker      quit in May 2015 after multiple attempts at trying to quit before   . Glucose intolerance (pre-diabetes) June 2015     hemoglobin A1c 6.6  . Metabolic syndrome     Pre-diabetes, hypertension and truncal obesity as well as dyslipidemia  . Upper respiratory infection   . Heart murmur   . Pneumonia "several times"  . Chronic bronchitis "used to get it q yr"  . OSA on CPAP   . GERD (gastroesophageal reflux disease)   . History of stomach ulcers   . Headache     "Imdur related; stopped taking it; headaches went away" (11/15/2013)  . Seizures "several"    "last one was ~ 2011" (11/15/2013)    Current Outpatient Prescriptions  Medication Sig Dispense Refill  . albuterol (PROVENTIL HFA) 108 (90 BASE) MCG/ACT inhaler Inhale 1 puff into the lungs every 6 (six) hours as needed. 1 Inhaler 2  . amoxicillin (AMOXIL) 875 MG tablet Take 1 tablet (875 mg total) by mouth 2 (two) times daily. 20 tablet 0  . aspirin EC 81 MG tablet Take 162 mg by mouth at bedtime.    Marland Kitchen buPROPion (WELLBUTRIN XL) 150 MG 24 hr tablet Take 150 mg by mouth  daily.    . clobetasol cream (TEMOVATE) [id number] % APPLY 1 APPLICATION TOPICALLY 2 (TWO) TIMES DAILY. 30 g 2  . CRESTOR 20 MG tablet TAKE 1 TABLET (20 MG TOTAL) BY MOUTH DAILY AT 6 PM. 30 tablet 3  . dexlansoprazole (DEXILANT) 60 MG capsule Take 60 mg by mouth daily.    Marland Kitchen EFFIENT 10 MG TABS tablet TAKE 1 TABLET (10 MG TOTAL) BY MOUTH DAILY. 30 tablet 0  . furosemide (LASIX) 20 MG tablet Take 1 tablet (20 mg total) by mouth daily. 30 tablet 5  . HYDROcodone-homatropine (HYCODAN) 5-1.5 MG/5ML syrup Take 5 mLs by mouth every 8 (eight) hours as needed for cough. 240 mL 0  . isosorbide mononitrate (IMDUR) 30 MG 24 hr tablet Take 1 tablet (30 mg total) by mouth daily. 30 tablet 6  . levocetirizine (XYZAL) 5 MG tablet Take 1 tablet (5 mg total) by mouth every evening. 30 tablet 2  .  losartan (COZAAR) 25 MG tablet TAKE 1 TABLET (25 MG TOTAL) BY MOUTH DAILY. 30 tablet 2  . metoprolol succinate (TOPROL-XL) 100 MG 24 hr tablet TAKE 1 TABLET BY MOUTH DAILY. TAKE WITH OR IMMEDIATELY FOLLOWING A MEAL. 30 tablet 0  . niacin (NIASPAN) 500 MG CR tablet Take 500 mg by mouth at bedtime. Takes 30 minutes after taking aspirin.    . nitroGLYCERIN (NITROSTAT) 0.4 MG SL tablet Place 1 tablet (0.4 mg total) under the tongue every 5 (five) minutes as needed for chest pain. 25 tablet 3  . prasugrel (EFFIENT) 10 MG TABS tablet Take 1 tablet (10 mg total) by mouth daily. 14 tablet 0  . ranolazine (RANEXA) 500 MG 12 hr tablet Take 1 tablet (500 mg total) by mouth 2 (two) times daily. 60 tablet 6   No current facility-administered medications for this visit.    Allergies  Allergen Reactions  . Niacin And Related Hives and Itching    Family History  Problem Relation Age of Onset  . Coronary artery disease Father   . Heart disease Father   . Stroke Father   . Hypertension Father   . Hyperlipidemia Mother   . Diabetes Mother   . Hyperlipidemia Maternal Grandmother   . Diabetes Maternal Grandmother   . Hyperlipidemia Maternal Grandfather   . Diabetes Maternal Grandfather   . Heart disease Paternal Grandmother   . Heart disease Paternal Grandfather   . Cancer Maternal Aunt     Social History   Social History  . Marital Status: Married    Spouse Name: N/A  . Number of Children: N/A  . Years of Education: N/A   Occupational History  . Not on file.   Social History Main Topics  . Smoking status: Former Smoker -- 0.50 packs/day for 40 years    Types: Cigarettes    Quit date: 06/19/2013  . Smokeless tobacco: Never Used     Comment: Quit with Bupropion 150mg   . Alcohol Use: 0.6 oz/week    1 Cans of beer per week     Comment: rare  . Drug Use: No  . Sexual Activity: Yes   Other Topics Concern  . Not on file   Social History Narrative   He is a married father of 67,  grandfather of 75. He does not really get routine exercise.    He is down to 5 or 6 cigarettes a day. He is very seriously wanting to quit. This is a significant cutback for him, but he has pretty much been told he  needs to stop by his wife, and so he fully intends to do so. He is willing to try the patches or whatever. He has a social alcoholic beverage every now and then.      Constitutional: Pt reports fatigue. Denies fever, malaise, headache or abrupt weight changes.  HEENT: Pt reports ear fullness, runny nose and nasal congestion. Denies eye pain, eye redness, ear pain, ringing in the ears, wax buildup, bloody nose, or sore throat. Respiratory: Denies difficulty breathing, shortness of breath, cough or sputum production.   Cardiovascular: Pt reports intermittent chest pain. Denies chest tightness, palpitations or swelling in the hands or feet.  Gastrointestinal: Denies abdominal pain, bloating, constipation, diarrhea or blood in the stool.  GU: Denies urgency, frequency, pain with urination, burning sensation, blood in urine, odor or discharge. Musculoskeletal: Denies decrease in range of motion, difficulty with gait, muscle pain or joint pain and swelling.  Skin: Denies redness, rashes, lesions or ulcercations.  Neurological: Pt reports intermittent dizziness. Denies difficulty with memory, difficulty with speech or problems with balance and coordination.  Psych: Denies anxiety, depression, SI/HI.  No other specific complaints in a complete review of systems (except as listed in HPI above).  Objective:   Physical Exam   BP 138/96 mmHg  Pulse 77  Temp(Src) 98.2 F (36.8 C) (Oral)  Ht 5\' 11"  (1.803 m)  Wt 176 lb (79.833 kg)  BMI 24.56 kg/m2  SpO2 97% Wt Readings from Last 3 Encounters:  03/04/15 176 lb (79.833 kg)  09/11/14 180 lb 4 oz (81.761 kg)  07/17/14 182 lb 12.8 oz (82.918 kg)    General: Appears his stated age, well developed, well nourished in NAD. Skin: Warm, dry and  intact.  HEENT: Head: normal shape and size; Eyes: sclera white, no icterus, conjunctiva pink, PERRLA and EOMs intact; Ears: bilateral cerumen impaction;Throat/Mouth: Teeth present, mucosa pink and moist, no exudate, lesions or ulcerations noted.  Neck:  Neck supple, trachea midline. No masses, lumps or thyromegaly present.  Cardiovascular: Normal rate and rhythm. S1,S2 noted.  No murmur, rubs or gallops noted. No JVD or BLE edema. No carotid bruits noted. Pulmonary/Chest: Normal effort and positive vesicular breath sounds. No respiratory distress. No wheezes, rales or ronchi noted.  Abdomen: Soft and tender at sight of umbilical hernia. Normal bowel sounds. No distention noted. Liver, spleen and kidneys non palpable. Musculoskeletal: Strength 5/5 BUE/BLE. No signs of joint swelling. No difficulty with gait.  Neurological: Alert and oriented. Cranial nerves II-XII grossly intact. Coordination normal.  Psychiatric: Mood and affect normal. Behavior is normal. Judgment and thought content normal.     BMET    Component Value Date/Time   NA 134* 07/01/2014 1017   NA 142 11/07/2013 1509   NA 139 10/24/2013 1050   K 4.4 07/01/2014 1017   K 3.8 10/24/2013 1050   CL 106 07/01/2014 1017   CL 107 10/24/2013 1050   CO2 22 07/01/2014 1017   CO2 24 10/24/2013 1050   GLUCOSE 113* 07/01/2014 1017   GLUCOSE 112* 11/07/2013 1509   GLUCOSE 108* 10/24/2013 1050   BUN 15 07/01/2014 1017   BUN 13 11/07/2013 1509   BUN 8 10/24/2013 1050   CREATININE 0.78 07/01/2014 1017   CREATININE 0.95 10/24/2013 1050   CREATININE 0.89 07/26/2013 0923   CALCIUM 9.2 07/01/2014 1017   CALCIUM 8.2* 10/24/2013 1050   GFRNONAA 83* 11/16/2013 0359   GFRNONAA >60 10/24/2013 1050   GFRAA >90 11/16/2013 0359   GFRAA >60 10/24/2013 1050  Lipid Panel     Component Value Date/Time   CHOL 148 07/01/2014 1017   TRIG 369.0* 07/01/2014 1017   HDL 30.60* 07/01/2014 1017   CHOLHDL 5 07/01/2014 1017   VLDL 73.8* 07/01/2014  1017   LDLCALC 62 07/26/2013 0923    CBC    Component Value Date/Time   WBC 12.3* 12/31/2013 1123   WBC 11.8* 11/07/2013 1509   WBC 22.2* 10/24/2013 1050   RBC 5.66 12/31/2013 1123   RBC 5.78 11/07/2013 1509   RBC 5.58 10/24/2013 1050   HGB 16.2 12/31/2013 1123   HGB 15.9 10/24/2013 1050   HCT 49.3 12/31/2013 1123   HCT 49.4 10/24/2013 1050   PLT 398.0 12/31/2013 1123   PLT 388 10/24/2013 1050   MCV 87.1 12/31/2013 1123   MCV 89 10/24/2013 1050   MCH 28.7 11/16/2013 0359   MCH 29.2 11/07/2013 1509   MCH 28.5 10/24/2013 1050   MCHC 32.9 12/31/2013 1123   MCHC 34.6 11/07/2013 1509   MCHC 32.2 10/24/2013 1050   RDW 15.1 12/31/2013 1123   RDW 14.0 11/07/2013 1509   RDW 14.5 10/24/2013 1050   LYMPHSABS 2.8 11/07/2013 1509   EOSABS 0.2 11/07/2013 1509   BASOSABS 0.1 11/07/2013 1509    Hgb A1C Lab Results  Component Value Date   HGBA1C 5.7 07/01/2014       Assessment & Plan:   Preventative Health Maintenance:  Flu and Pneumonia vaccines today Tdap today Colonoscopy UTD Encouraged him to see an eye doctor and dentist at least annually Encouraged him to consume a healthy diet and start an exercise program Will check CBC, CMET, Lipid, A1C, microalbumin and PSA today   Dizziness:  I think it may be related to BPPV He does have bilateral cerumen impaction- manual lavage by CMA Use Debrox drops 2 x week to prevent wax buildup Orthostatics normal If dizziness continues, consider referral to vestibular rehab  Umbilical hernia:  Referral placed to general surgery for further evaluation  Seasonal Allergies:  Start Flonase in addition to Xyzal Refferall placed to Allergy for further evaluation  RTC in 6 months to follow up chronic conditons

## 2015-03-04 NOTE — Patient Instructions (Signed)

## 2015-03-04 NOTE — Progress Notes (Signed)
Pre visit review using our clinic review tool, if applicable. No additional management support is needed unless otherwise documented below in the visit note. 

## 2015-03-04 NOTE — Telephone Encounter (Signed)
Ready to reschedule his colonoscopy now

## 2015-03-04 NOTE — Telephone Encounter (Signed)
Pt has been rescheduled for his colonoscopy at [facility] on 03/28/15. Instructs/rx mailed. Pt has [employer]. Please check authorization.

## 2015-03-05 ENCOUNTER — Telehealth: Payer: Self-pay | Admitting: *Deleted

## 2015-03-05 ENCOUNTER — Other Ambulatory Visit: Payer: Self-pay

## 2015-03-05 LAB — COMPREHENSIVE METABOLIC PANEL
ALT: 19 U/L (ref 0–53)
AST: 19 U/L (ref 0–37)
Albumin: 4.3 g/dL (ref 3.5–5.2)
Alkaline Phosphatase: 126 U/L — ABNORMAL HIGH (ref 39–117)
BUN: 9 mg/dL (ref 6–23)
CO2: 23 mEq/L (ref 19–32)
Calcium: 9.1 mg/dL (ref 8.4–10.5)
Chloride: 108 mEq/L (ref 96–112)
Creatinine, Ser: 0.82 mg/dL (ref 0.40–1.50)
GFR: 103.75 mL/min (ref >60.00)
Glucose, Bld: 92 mg/dL (ref 70–99)
Potassium: 4.2 mEq/L (ref 3.5–5.1)
Sodium: 140 mEq/L (ref 135–145)
Total Bilirubin: 0.5 mg/dL (ref 0.2–1.2)
Total Protein: 7 g/dL (ref 6.0–8.3)

## 2015-03-05 LAB — LIPID PANEL
Cholesterol: 224 mg/dL — ABNORMAL HIGH (ref 0–200)
HDL: 37.7 mg/dL — ABNORMAL LOW (ref >39.00)
NonHDL: 186.61
Total CHOL/HDL Ratio: 6
Triglycerides: 293 mg/dL — ABNORMAL HIGH (ref 0.0–149.0)
VLDL: 58.6 mg/dL — ABNORMAL HIGH (ref 0.0–40.0)

## 2015-03-05 LAB — CBC
HCT: 53.8 % — ABNORMAL HIGH (ref 39.0–52.0)
Hemoglobin: 18 g/dL (ref 13.0–17.0)
MCHC: 33.4 g/dL (ref 30.0–36.0)
MCV: 88.9 fl (ref 78.0–100.0)
Platelets: 354 10*3/uL (ref 150.0–400.0)
RBC: 6.05 Mil/uL — ABNORMAL HIGH (ref 4.22–5.81)
RDW: 14.4 % (ref 11.5–15.5)
WBC: 13.6 10*3/uL — ABNORMAL HIGH (ref 4.0–10.5)

## 2015-03-05 LAB — PSA: PSA: 0.34 ng/mL (ref 0.10–4.00)

## 2015-03-05 LAB — HEMOGLOBIN A1C: Hgb A1c MFr Bld: 5.7 % (ref 4.6–6.5)

## 2015-03-05 LAB — LDL CHOLESTEROL, DIRECT: Direct LDL: 138 mg/dL

## 2015-03-05 NOTE — Telephone Encounter (Signed)
noted 

## 2015-03-05 NOTE — Telephone Encounter (Signed)
Critical lab- Hgb 18.0, pending other lab results before they can be released into Epic.

## 2015-03-06 ENCOUNTER — Telehealth: Payer: Self-pay

## 2015-03-06 NOTE — Telephone Encounter (Signed)
Faxed clearance from [facility] to Dr. Ellyn Hack at [facility]. Pt has appt w/Bryan Samara Snide in the [place].

## 2015-03-06 NOTE — Addendum Note (Signed)
Addended by: Marchia Bond on: 03/06/2015 04:13 PM   Modules accepted: Miquel Dunn

## 2015-03-07 ENCOUNTER — Ambulatory Visit: Payer: Self-pay | Admitting: Adult Health

## 2015-03-10 ENCOUNTER — Telehealth: Payer: Self-pay

## 2015-03-10 NOTE — Telephone Encounter (Signed)
Pt notified per Dr. Glenetta Hew to stop Effient 10mg  5 days prior to his colonoscopy/EGD. Pt is to restart 1 - 2 days after procedure depending on if polyps are removed. Pt understands instructions.

## 2015-03-12 ENCOUNTER — Ambulatory Visit (INDEPENDENT_AMBULATORY_CARE_PROVIDER_SITE_OTHER): Payer: 59 | Admitting: Physician Assistant

## 2015-03-12 ENCOUNTER — Encounter: Payer: Self-pay | Admitting: Surgery

## 2015-03-12 ENCOUNTER — Ambulatory Visit (INDEPENDENT_AMBULATORY_CARE_PROVIDER_SITE_OTHER): Payer: 59 | Admitting: Surgery

## 2015-03-12 ENCOUNTER — Other Ambulatory Visit: Payer: Self-pay

## 2015-03-12 ENCOUNTER — Ambulatory Visit: Payer: Self-pay | Admitting: Surgery

## 2015-03-12 ENCOUNTER — Encounter: Payer: Self-pay | Admitting: Physician Assistant

## 2015-03-12 VITALS — BP 130/89 | HR 108 | Temp 98.3°F | Ht 71.0 in | Wt 182.0 lb

## 2015-03-12 VITALS — BP 132/60 | HR 112 | Ht 72.0 in | Wt 182.2 lb

## 2015-03-12 DIAGNOSIS — K429 Umbilical hernia without obstruction or gangrene: Secondary | ICD-10-CM

## 2015-03-12 DIAGNOSIS — R0600 Dyspnea, unspecified: Secondary | ICD-10-CM | POA: Insufficient documentation

## 2015-03-12 DIAGNOSIS — R000 Tachycardia, unspecified: Secondary | ICD-10-CM | POA: Diagnosis not present

## 2015-03-12 DIAGNOSIS — I10 Essential (primary) hypertension: Secondary | ICD-10-CM | POA: Diagnosis not present

## 2015-03-12 DIAGNOSIS — E785 Hyperlipidemia, unspecified: Secondary | ICD-10-CM | POA: Diagnosis not present

## 2015-03-12 DIAGNOSIS — I5042 Chronic combined systolic (congestive) and diastolic (congestive) heart failure: Secondary | ICD-10-CM

## 2015-03-12 DIAGNOSIS — I255 Ischemic cardiomyopathy: Secondary | ICD-10-CM

## 2015-03-12 DIAGNOSIS — I5189 Other ill-defined heart diseases: Secondary | ICD-10-CM

## 2015-03-12 DIAGNOSIS — I2510 Atherosclerotic heart disease of native coronary artery without angina pectoris: Secondary | ICD-10-CM

## 2015-03-12 DIAGNOSIS — Z9861 Coronary angioplasty status: Secondary | ICD-10-CM

## 2015-03-12 NOTE — Progress Notes (Signed)
Surgical Consultation  03/12/2015  Troy Parrish is an 55 y.o. male.   CC: umbilical hernia  HPI: this a patient with several year history of a known umbilical hernia which is only started causing him discomfort over the last few months it is not worsening but is definitely present. He's noticed a bulge and discomfort does not describe it as pain he's had no nausea vomiting fevers or chills.  Also of note is the patient is scheduled for a colonoscopy and an EGD for reflux disease on February 17 and we'll be stopping his antiplatelet drug at that time.   He has significant coronary artery disease has stents in place and a myocardial infarction is an ex-smoker nondiabetic on an antiplatelet medication. He is been cleared by his cardiologist for procedures.  Past Medical History  Diagnosis Date  . Essential hypertension 07/07/2011  . History of colon polyps   . Hyperlipidemia with target LDL less than 70 07/07/2011  . Recurrent upper respiratory infection (URI)   . CAD S/P percutaneous coronary angioplasty 07/07/2011; 6 & 10 2015    S/P PCI to all 3 major vessels;a)  Ant STEMI 2001- BMS-LAD x2 -->(redo PCI 6/'15 -- pLAD Xience DES 2.5 x 12- 2.75 mm, mLAD 2.25 x 12 - 2.7 mm), b) '06 UA --> Cx- OM DES; c) 2013 Inf MI BMS mRCA --> d) 10/'15 PCI dRCA Promus P DES 4.0 x 16 (4.25 mm); PTCA of RPL2 (2.0 mm) &RPDA (2.25 mm) 11/2013  . ST elevation myocardial infarction (STEMI) of anterior wall ([place]) 2001    History of -- 2 stents in early and distal mid LAD; prior cardiologist was Dr. Remi Haggard in [place]  . ST elevation myocardial infarction (STEMI) of inferior wall ([place]) 07/07/2011    Occluded RCA 0.10U72 INTEGRITY; Echo 07/2013: EF 45-50%, Inferior & Posterolateral HK.   Marland Kitchen Unstable angina ([place])  2006; June 2015    Cx-OM - PCI 2.5 mm 13 mm Cypher DES [name], [place]) ; 07/2013: Severe ISR of both prox & Distal LAD stentS --> 2 DES stents (1 at prox edge of the proximal stent, 2nd covers the entire  distal stent as well as proximal and distal edge stenose)   . COPD (chronic obstructive pulmonary disease) ([place])   . Former heavy tobacco smoker      quit in May 2015 after multiple attempts at trying to quit before   . Glucose intolerance (pre-diabetes) June 2015     hemoglobin A1c 6.6  . Metabolic syndrome     Pre-diabetes, hypertension and truncal obesity as well as dyslipidemia  . Upper respiratory infection   . Heart murmur   . Pneumonia "several times"  . Chronic bronchitis ([place]) "used to get it q yr"  . OSA on CPAP   . GERD (gastroesophageal reflux disease)   . History of stomach ulcers   . Headache     "Imdur related; stopped taking it; headaches went away" (11/15/2013)  . Seizures ([place]) "several"    "last one was ~ 2011" (11/15/2013)    Past Surgical History  Procedure Laterality Date  . Transthoracic echocardiogram  07/10/2011    EF 50-55% ,LV graded 1 diastolic dysf.   . Nuclear exercise study  11/30/2011    EF 45% ,exercise 10 METS, infarct\scar w mild perinfarct ischemia -basal inferolat and mid inferolat region  . Met/cpet  11/09/2011    submax. effort 1.05 RER peak V02 was 51% ,chronotropic incomp.hrt rate lows 80s to 100  . Percutaneous coronary stent  intervention (pci-s)   June 2015    Distal mid LAD: Xience Alpine DES 2.25 mm x 28 mm (overlapping proximal and distal edge of previous stent) - 2.7 mm; proximal LAD 2.5 mm x 12 mm Xience Alpine DES (2.8 mm);; RCA 60-70% stenosis planned staged procedure  . Transthoracic echocardiogram  07/31/2013    LVEF 45-50. Inferior posterior hypokinesis with mild dilation. Apparent normal diastolic pressures  . Right heart cath  June 2015    Normal pressures with severely reduced cardiac output and index. (3.25/1.55)  . Cardiac catheterization  07/07/2011     inferolat post LV infarct ST-elevation MI  . Coronary angioplasty with stent placement  06/2011    occluded RCA ,Integrity Resolute DES 2.75 x 18 mm stent dilated 3.1 mm  .  Coronary angioplasty with stent placement  2001    ANTERIOR mi with a  PCI to LAD   . Coronary angioplasty with stent placement  2006    [place] by Dr Dorris Fetch -lesion lft circ/OM1 with 2.5 x 37m Cypher DES  . Coronary angioplasty with stent placement  11/15/2013    Promus Premier DES 4.0 mm x 16 mm (4.25 mm) dRCA, 2.0 mm Cutting PTCA of RPL2 Ostium & POBA of mRPDA  . Left heart catheterization with coronary angiogram N/A 07/07/2011    Procedure: LEFT HEART CATHETERIZATION WITH CORONARY ANGIOGRAM;  Surgeon: DLeonie Man MD;  [facility];  Service: Cardiovascular;  Laterality: N/A;  . Percutaneous coronary stent intervention (pci-s)  07/07/2011    Procedure: PERCUTANEOUS CORONARY STENT INTERVENTION (PCI-S);  Surgeon: DLeonie Man MD;  [facility];  Service: Cardiovascular;;  . Left and right heart catheterization with coronary angiogram N/A 07/30/2013    Procedure: LEFT AND RIGHT HEART CATHETERIZATION WITH CORONARY ANGIOGRAM;  Surgeon: DLeonie Man MD;  [facility];  Service: Cardiovascular;  Laterality: N/A;  . Percutaneous coronary stent intervention (pci-s)  07/30/2013    Procedure: PERCUTANEOUS CORONARY STENT INTERVENTION (PCI-S);  Surgeon: DLeonie Man MD;  [facility];  Service: Cardiovascular;;  . Left heart cath  08/03/2013    Procedure: LEFT HEART CATH;  Surgeon: DLeonie Man MD;  [facility];  Service: Cardiovascular;;  . Percutaneous coronary stent intervention (pci-s) N/A 11/15/2013    Procedure: PERCUTANEOUS CORONARY STENT INTERVENTION (PCI-S);  Surgeon: DLeonie Man MD;  [facility];  Service: Cardiovascular;  Laterality: N/A;    Family History  Problem Relation Age of Onset  . Coronary artery disease Father   . Heart disease Father   . Stroke Father   . Hypertension Father   . Hyperlipidemia Mother   . Diabetes Mother   . Hyperlipidemia Maternal Grandmother   . Diabetes Maternal Grandmother   .  Hyperlipidemia Maternal Grandfather   . Diabetes Maternal Grandfather   . Heart disease Paternal Grandmother   . Heart disease Paternal Grandfather   . Cancer Maternal Aunt     Social History:  reports that he quit smoking about 20 months ago. His smoking use included Cigarettes. He has a 20 pack-year smoking history. He has never used smokeless tobacco. He reports that he drinks about 0.6 oz of alcohol per week. He reports that he does not use illicit drugs.  Allergies:  Allergies  Allergen Reactions  . Niacin And Related Hives and Itching    Medications reviewed.   Review of Systems:   Review of Systems  Constitutional: Negative.  Negative for fever and  chills.  HENT: Negative.   Eyes: Negative.   Respiratory: Negative.   Cardiovascular: Negative.   Gastrointestinal: Positive for heartburn and abdominal pain. Negative for nausea, vomiting, diarrhea, constipation, blood in stool and melena.  Genitourinary: Negative.   Musculoskeletal: Negative.   Skin: Negative.   Neurological: Negative.   Endo/Heme/Allergies: Negative.   Psychiatric/Behavioral: Negative.      Physical Exam:  BP 130/89 mmHg  Pulse 108  Temp(Src) 98.3 F (36.8 C) (Oral)  Ht '5\' 11"'$  (1.803 m)  Wt 182 lb (82.555 kg)  BMI 25.40 kg/m2  Physical Exam  Constitutional: He is oriented to person, place, and time and well-developed, well-nourished, and in no distress. No distress.  HENT:  Head: Normocephalic and atraumatic.  Eyes: Pupils are equal, round, and reactive to light. Right eye exhibits no discharge. Left eye exhibits no discharge. No scleral icterus.  Neck: Normal range of motion.  Cardiovascular: Normal rate, regular rhythm and normal heart sounds.   No murmur heard. Pulmonary/Chest: Effort normal and breath sounds normal. No respiratory distress. He has no wheezes. He has no rales.  Abdominal: Soft. He exhibits no distension. There is no tenderness. There is no rebound and no guarding.   Umbilical hernia partially incarcerated with omental complements only partially reducible nontender no erythema  Musculoskeletal: Normal range of motion. He exhibits no edema.  Lymphadenopathy:    He has no cervical adenopathy.  Neurological: He is alert and oriented to person, place, and time.  Skin: Skin is warm and dry. He is not diaphoretic.  Psychiatric: Mood and affect normal.  Vitals reviewed.     No results found for this or any previous visit (from the past 48 hour(s)). No results found.  Assessment/Plan:  This is a patient with an umbilical hernia he will be off his antiplatelet drug for colonoscopy and EGD in the near future for reflux disease. He also has polyps. I suggested repair when he is off his antiplatelet drugs and will look at the schedule for the pot potential day in which we could repair this. He was in agreement with this plan. I discussed with him the rationale for surgery the options of observation risk of bleeding infection mesh placement mesh infection mesh removal cosmetic deformity seroma etc. he understood and agreed to proceed  Florene Glen, MD, FACS

## 2015-03-12 NOTE — Patient Instructions (Signed)
We will do your surgery on Thursday 04/03/2015.

## 2015-03-12 NOTE — Patient Instructions (Signed)
Your physician has requested that you have an echocardiogram. Echocardiography is a painless test that uses sound waves to create images of your heart. It provides your doctor with information about the size and shape of your heart and how well your heart's chambers and valves are working. This procedure takes approximately one hour. There are no restrictions for this procedure.   Your physician recommends that you return for lab work TODAY.  Your physician recommends that you schedule a follow-up appointment in: 3 months with Dr Ellyn Hack.

## 2015-03-12 NOTE — Progress Notes (Signed)
Patient ID: Troy Parrish, male   DOB: 1960/02/17, 55 y.o.   MRN: [id number]    Date:  03/12/2015   ID:  Troy Parrish, DOB 07/09/1960, MRN [id number]  PCP:  Webb Silversmith, NP  Primary Cardiologist:  Ellyn Hack  Chief Complaint  Patient presents with  . Follow-up    PT C/O SOB AND CHEST PAIN BUT NOT CONCERNED, DIZZZINESS     History of Present Illness: Troy Parrish is a 55 y.o. male with a PMH below who presents today Followup of CAD status post multivessel PCI. His most recent intervention was back in October of 2015 for persistent class III exertional dyspnea and chest pain. Perform PCI of the distal RCA with PTCA of both the posterolateral branches.  During his last office visit February 2016 he noted that since his PCI he has not had nearly as much of the fatigue with walking and ambulating.  Patient presents today for evaluation of dyspnea. He says that he's been off his meds for the last 4 months because his wife lost her job and had to restart new insurance. He has been back on them for the last 2 weeks with the exception of Lasix. He reports that he gets very dyspneic with minimal exertion. He walks proximal 20 minutes, 4 times per week with his wife and he cannot keep up with her. He wakes up feeling dizzy with loss of balance. He is primary care provider is following that. Also reports chest pain which is not exertional. It feels like somebody poking him.   It does not feel anything like it did when he required stenting.  Patient reports that he quit smoking however, he wreaks of tobacco(Second hand?)  Family smokes.    The patient currently denies nausea, vomiting, fever, orthopnea, PND, cough, congestion, abdominal pain, hematochezia, melena, lower extremity edema, claudication.  Wt Readings from Last 3 Encounters:  03/12/15 182 lb 3.2 oz (82.645 kg)  03/12/15 182 lb (82.555 kg)  03/04/15 176 lb (79.833 kg)     Past Medical History  Diagnosis Date  . Essential hypertension 07/07/2011  .  History of colon polyps   . Hyperlipidemia with target LDL less than 70 07/07/2011  . Recurrent upper respiratory infection (URI)   . CAD S/P percutaneous coronary angioplasty 07/07/2011; 6 & 10 2015    S/P PCI to all 3 major vessels;a)  Ant STEMI 2001- BMS-LAD x2 -->(redo PCI 6/'15 -- pLAD Xience DES 2.5 x 12- 2.75 mm, mLAD 2.25 x 12 - 2.7 mm), b) '06 UA --> Cx- OM DES; c) 2013 Inf MI BMS mRCA --> d) 10/'15 PCI dRCA Promus P DES 4.0 x 16 (4.25 mm); PTCA of RPL2 (2.0 mm) &RPDA (2.25 mm) 11/2013  . ST elevation myocardial infarction (STEMI) of anterior wall ([place]) 2001    History of -- 2 stents in early and distal mid LAD; prior cardiologist was Dr. Remi Haggard in [place]  . ST elevation myocardial infarction (STEMI) of inferior wall ([place]) 07/07/2011    Occluded RCA [id number] INTEGRITY; Echo 07/2013: EF 45-50%, Inferior & Posterolateral HK.   Marland Kitchen Unstable angina ([place])  2006; June 2015    Cx-OM - PCI 2.5 mm 13 mm Cypher DES [name], [place]) ; 07/2013: Severe ISR of both prox & Distal LAD stentS --> 2 DES stents (1 at prox edge of the proximal stent, 2nd covers the entire distal stent as well as proximal and distal edge stenose)   . COPD (chronic obstructive pulmonary disease) ([place])   . Former  heavy tobacco smoker      quit in May 2015 after multiple attempts at trying to quit before   . Glucose intolerance (pre-diabetes) June 2015     hemoglobin A1c 6.6  . Metabolic syndrome     Pre-diabetes, hypertension and truncal obesity as well as dyslipidemia  . Upper respiratory infection   . Heart murmur   . Pneumonia "several times"  . Chronic bronchitis ([place]) "used to get it q yr"  . OSA on CPAP   . GERD (gastroesophageal reflux disease)   . History of stomach ulcers   . Headache     "Imdur related; stopped taking it; headaches went away" (11/15/2013)  . Seizures ([place]) "several"    "last one was ~ 2011" (11/15/2013)    Current Outpatient Prescriptions  Medication Sig Dispense Refill  . albuterol  (PROVENTIL HFA) 108 (90 BASE) MCG/ACT inhaler Inhale 1 puff into the lungs every 6 (six) hours as needed. 1 Inhaler 2  . aspirin EC 81 MG tablet Take 162 mg by mouth at bedtime.    Marland Kitchen azelastine (OPTIVAR) 0.05 % ophthalmic solution Place 1 drop into both eyes daily.    Marland Kitchen buPROPion (WELLBUTRIN XL) 150 MG 24 hr tablet Take 150 mg by mouth daily.    . clobetasol cream (TEMOVATE) [id number] % APPLY 1 APPLICATION TOPICALLY 2 (TWO) TIMES DAILY. 30 g 2  . CRESTOR 20 MG tablet TAKE 1 TABLET (20 MG TOTAL) BY MOUTH DAILY AT 6 PM. 30 tablet 3  . EFFIENT 10 MG TABS tablet TAKE 1 TABLET (10 MG TOTAL) BY MOUTH DAILY. 30 tablet 0  . fluticasone (FLONASE) 50 MCG/ACT nasal spray Place 2 sprays into both nostrils daily.    . isosorbide mononitrate (IMDUR) 30 MG 24 hr tablet Take 1 tablet (30 mg total) by mouth daily. 30 tablet 6  . losartan (COZAAR) 25 MG tablet TAKE 1 TABLET (25 MG TOTAL) BY MOUTH DAILY. 30 tablet 2  . metoprolol succinate (TOPROL-XL) 100 MG 24 hr tablet TAKE 1 TABLET BY MOUTH DAILY. TAKE WITH OR IMMEDIATELY FOLLOWING A MEAL. 30 tablet 0  . niacin (NIASPAN) 500 MG CR tablet Take 500 mg by mouth at bedtime. Takes 30 minutes after taking aspirin.    . nitroGLYCERIN (NITROSTAT) 0.4 MG SL tablet Place 1 tablet (0.4 mg total) under the tongue every 5 (five) minutes as needed for chest pain. 25 tablet 3  . ranolazine (RANEXA) 500 MG 12 hr tablet Take 1 tablet (500 mg total) by mouth 2 (two) times daily. 60 tablet 6   No current facility-administered medications for this visit.    Allergies:    Allergies  Allergen Reactions  . Niacin And Related Hives and Itching    Social History:  The patient  reports that he quit smoking about 20 months ago. His smoking use included Cigarettes. He has a 20 pack-year smoking history. He has never used smokeless tobacco. He reports that he drinks about 0.6 oz of alcohol per week. He reports that he does not use illicit drugs.   Family history:   Family History    Problem Relation Age of Onset  . Coronary artery disease Father   . Heart disease Father   . Stroke Father   . Hypertension Father   . Hyperlipidemia Mother   . Diabetes Mother   . Hyperlipidemia Maternal Grandmother   . Diabetes Maternal Grandmother   . Hyperlipidemia Maternal Grandfather   . Diabetes Maternal Grandfather   . Heart disease Paternal Grandmother   .  Heart disease Paternal Grandfather   . Cancer Maternal Aunt     ROS:  Please see the history of present illness.  All other systems reviewed and negative.   PHYSICAL EXAM: VS:  BP 132/60 mmHg  Pulse 112  Ht 6' (1.829 m)  Wt 182 lb 3.2 oz (82.645 kg)  BMI 24.71 kg/m2 Well nourished, well developed, in no acute distress HEENT: Pupils are equal round react to light accommodation extraocular movements are intact.  Neck: no JVDNo cervical lymphadenopathy. Cardiac: Regular rhythm, elevated rate without murmurs rubs or gallops. Lungs:  clear to auscultation bilaterally, no wheezing, rhonchi or rales Abd: soft, nontender, positive bowel sounds all quadrants, no hepatosplenomegaly Ext: no lower extremity edema.  2+ radial and dorsalis pedis pulses. Skin: warm and dry Neuro:  Grossly normal  EKG:   Sinus tachycardia rate 112 bpm  ASSESSMENT AND PLAN:  Problem List Items Addressed This Visit    Hyperlipidemia with target LDL less than 70; by his report he is back on Crestor (Chronic)   Essential hypertension (Chronic)   Dyspnea - Primary   Chronotropic incompetence with left ventricular dysfunction --> improved with reduction of beta blocker (Chronic)   Chronic combined systolic and diastolic congestive heart failure, NYHA class 2 (HCC) (Chronic)   Cardiomyopathy, ischemic; EF~45% with hypokinesis of the inferior and inferolateral myocardium; CO 3.25 (Chronic)   CAD S/P percutaneous coronary angioplasty (Chronic)    Other Visit Diagnoses    Tachycardia        Relevant Orders    EKG 12-Lead       Patient has not  been on Lasix for 4 months he appears to be doing well. He is looks euvolemic. He has tachycardiac rate of 112 bpm and is getting severely dyspneic with exertion. My initial thoughts were that he has a pulmonary embolism. He has been mildly tachycardic in the past. His last 2 EKGs heart rates were in the 70s and 60s.   We ambulated him around the office O2 saturations stayed between 96 and 97% on room air with a heart rate of 106-122. He did get very short of breath.  He has been back on metoprolol for 2 weeks so I do not think it's a rebound tachycardia.   We will check a d-dimer and 2-D echocardiogram.  Continue his metoprolol succinate 100 mg daily along with Effient 10 mg, aspirin, Cozaar 25 mg daily Ranexa 500 mg twice daily and Imdur 30 mg and Crestor 20 mg.  He is scheduled on February 17 for EGD and colonoscopy and then on the [id number] for umbilical hernia surgery. He will be off Effient for that time. Appears she's been doing fairly well even though he's been off Effient for 4 months. Also of note his primary care provider checked his labs on the 24th his white count was elevated at 13.6 and his hemoglobin was elevated at 18.  Related to hypoxemia?   A1c was 5.7.

## 2015-03-13 ENCOUNTER — Telehealth: Payer: Self-pay | Admitting: Surgery

## 2015-03-13 LAB — D-DIMER, QUANTITATIVE (NOT AT ARMC): D-Dimer, Quant: 0.44 ug/mL-FEU (ref 0.00–0.48)

## 2015-03-13 NOTE — Telephone Encounter (Signed)
Pt advised of pre op date/time and sx date. Sx: 04/02/15 with Dr Caro Laroche hernia repair. Pre op: 03/25/15 @ 8:15am--Office.

## 2015-03-14 ENCOUNTER — Encounter: Payer: Self-pay | Admitting: Adult Health

## 2015-03-14 ENCOUNTER — Ambulatory Visit (INDEPENDENT_AMBULATORY_CARE_PROVIDER_SITE_OTHER)
Admission: RE | Admit: 2015-03-14 | Discharge: 2015-03-14 | Disposition: A | Discharge status: Home / Self Care | Payer: 59 | Source: Ambulatory Visit | Attending: Adult Health | Admitting: Adult Health

## 2015-03-14 ENCOUNTER — Ambulatory Visit (INDEPENDENT_AMBULATORY_CARE_PROVIDER_SITE_OTHER): Payer: 59 | Admitting: Adult Health

## 2015-03-14 VITALS — BP 108/74 | HR 84 | Temp 97.8°F | Ht 66.0 in | Wt 182.0 lb

## 2015-03-14 DIAGNOSIS — J449 Chronic obstructive pulmonary disease, unspecified: Secondary | ICD-10-CM | POA: Diagnosis not present

## 2015-03-14 DIAGNOSIS — G4733 Obstructive sleep apnea (adult) (pediatric): Secondary | ICD-10-CM | POA: Diagnosis not present

## 2015-03-14 NOTE — Progress Notes (Signed)
Subjective:    Patient ID: Troy Parrish, male    DOB: [birth year], 55 y.o.   MRN: [id number]  HPI 55 yo former smoker with OSA, and borderline COPD.  TESTS: Echo 07/10/11 >> EF 50 to [id number], grade 1 diastolic dysfx  Spirometry 11/09/11 >> FEV1 3.26 (67%), FEV1% 67 PFT 04/11/13 >> FEV1 2.65 (69%), FEV1% 72, TLC 6.58 (94%), DLCO 70%, +BD  PSG 04/13/13 >> AHI 59.2, SpO2 low 86%. Auto CPAP 05/26/13 to 06/08/13 >> used on 14 of 14 nights with average 4 hrs 51 min.  Average AHI 1.5 with median CPAP 8 cm H2O and 95 th percentile CPAP 11 cm H2O.  03/14/2015 Follow up : COPD and OSA  Pt returns for 6 month follow up .  He remains on BREO for mild COPD .  Pt was recently seen at cardiology , noted to have tachycardia and DOE .   2 D echo is planned. D Dimer was neg.  Says he does get winded easily . No chest pain, orthopnea, or increased edema.  CXR today emphysema , no acute findings.  Mild cough minimally productive w/ no discolored mucus. No fever.  Previous echo in 2015 with EF at 45-50%.   Suppose to be on CPAP for severe OSA. Says he has not been wearing. We discussed the importance of CPAP and dangers of untreated OSA. He says he will try to wear this . Discussed not driving if sleepy.    Past Medical History  Diagnosis Date  . Essential hypertension 07/07/2011  . History of colon polyps   . Hyperlipidemia with target LDL less than 70 07/07/2011  . Recurrent upper respiratory infection (URI)   . CAD S/P percutaneous coronary angioplasty 07/07/2011; 6 & 10 2015    S/P PCI to all 3 major vessels;a)  Ant STEMI 2001- BMS-LAD x2 -->(redo PCI 6/'15 -- pLAD Xience DES 2.5 x 12- 2.75 mm, mLAD 2.25 x 12 - 2.7 mm), b) '06 UA --> Cx- OM DES; c) 2013 Inf MI BMS mRCA --> d) 10/'15 PCI dRCA Promus P DES 4.0 x 16 (4.25 mm); PTCA of RPL2 (2.0 mm) &RPDA (2.25 mm) 11/2013  . ST elevation myocardial infarction (STEMI) of anterior wall ([place]) 2001    History of -- 2 stents in early and distal mid LAD; prior  cardiologist was Dr. Remi Haggard in [place]  . ST elevation myocardial infarction (STEMI) of inferior wall ([place]) 07/07/2011    Occluded RCA [id number] [facility]TEGRITY; Echo 07/2013: EF 45-50%, Inferior & Posterolateral HK.   Marland Kitchen Unstable angina ([place])  2006; June 2015    Cx-OM - PCI 2.5 mm 13 mm Cypher DES [name], [place]) ; 07/2013: Severe ISR of both prox & Distal LAD stentS --> 2 DES stents (1 at prox edge of the proximal stent, 2nd covers the entire distal stent as well as proximal and distal edge stenose)   . COPD (chronic obstructive pulmonary disease) ([place])   . Former heavy tobacco smoker      quit in May 2015 after multiple attempts at trying to quit before   . Glucose intolerance (pre-diabetes) June 2015     hemoglobin A1c 6.6  . Metabolic syndrome     Pre-diabetes, hypertension and truncal obesity as well as dyslipidemia  . Upper respiratory infection   . Heart murmur   . Pneumonia "several times"  . Chronic bronchitis ([place]) "used to get it q yr"  . OSA on CPAP   . GERD (gastroesophageal reflux disease)   .  History of stomach ulcers   . Headache     "Imdur related; stopped taking it; headaches went away" (11/15/2013)  . Seizures ([place]) "several"    "last one was ~ 2011" (11/15/2013)   Current Outpatient Prescriptions on File Prior to Visit  Medication Sig Dispense Refill  . albuterol (PROVENTIL HFA) 108 (90 BASE) MCG/ACT inhaler Inhale 1 puff into the lungs every 6 (six) hours as needed. 1 Inhaler 2  . aspirin EC 81 MG tablet Take 162 mg by mouth at bedtime.    Marland Kitchen buPROPion (WELLBUTRIN XL) 150 MG 24 hr tablet Take 150 mg by mouth daily.    . clobetasol cream (TEMOVATE) [id number] % APPLY 1 APPLICATION TOPICALLY 2 (TWO) TIMES DAILY. 30 g 2  . CRESTOR 20 MG tablet TAKE 1 TABLET (20 MG TOTAL) BY MOUTH DAILY AT 6 PM. 30 tablet 3  . EFFIENT 10 MG TABS tablet TAKE 1 TABLET (10 MG TOTAL) BY MOUTH DAILY. 30 tablet 0  . fluticasone (FLONASE) 50 MCG/ACT nasal spray Place 2 sprays into both nostrils  daily.    . isosorbide mononitrate (IMDUR) 30 MG 24 hr tablet Take 1 tablet (30 mg total) by mouth daily. 30 tablet 6  . losartan (COZAAR) 25 MG tablet TAKE 1 TABLET (25 MG TOTAL) BY MOUTH DAILY. 30 tablet 2  . metoprolol succinate (TOPROL-XL) 100 MG 24 hr tablet TAKE 1 TABLET BY MOUTH DAILY. TAKE WITH OR IMMEDIATELY FOLLOWING A MEAL. 30 tablet 0  . niacin (NIASPAN) 500 MG CR tablet Take 500 mg by mouth at bedtime. Takes 30 minutes after taking aspirin.    . nitroGLYCERIN (NITROSTAT) 0.4 MG SL tablet Place 1 tablet (0.4 mg total) under the tongue every 5 (five) minutes as needed for chest pain. 25 tablet 3  . ranolazine (RANEXA) 500 MG 12 hr tablet Take 1 tablet (500 mg total) by mouth 2 (two) times daily. 60 tablet 6  . azelastine (OPTIVAR) 0.05 % ophthalmic solution Place 1 drop into both eyes daily. Reported on 03/14/2015     No current facility-administered medications on file prior to visit.     Review of Systems Constitutional:   No  weight loss, night sweats,  Fevers, chills,  +fatigue, or  lassitude.  HEENT:   No headaches,  Difficulty swallowing,  Tooth/dental problems, or  Sore throat,                No sneezing, itching, ear ache, nasal congestion, post nasal drip,   CV:  No chest pain,  Orthopnea, PND, swelling in lower extremities, anasarca, dizziness, palpitations, syncope.   GI  No heartburn, indigestion, abdominal pain, nausea, vomiting, diarrhea, change in bowel habits, loss of appetite, bloody stools.   Resp:   No chest wall deformity  Skin: no rash or lesions.  GU: no dysuria, change in color of urine, no urgency or frequency.  No flank pain, no hematuria   MS:  No joint pain or swelling.  No decreased range of motion.  No back pain.  Psych:  No change in mood or affect. No depression or anxiety.  No memory loss.         Objective:   Physical Exam Filed Vitals:   03/14/15 1440  BP: 108/74  Pulse: 84  Temp: 97.8 F (36.6 C)  TempSrc: Oral  Height: 5\' 6"   (1.676 m)  Weight: 182 lb (82.555 kg)  SpO2: 95%   GEN: A/Ox3; pleasant , NAD   HEENT:  [place]/AT,  EACs-clear, TMs-wnl, NOSE-clear, THROAT-clear,  no lesions, no postnasal drip or exudate noted.   NECK:  Supple w/ fair ROM; no JVD; normal carotid impulses w/o bruits; no thyromegaly or nodules palpated; no lymphadenopathy.  RESP  Decreased BS in bases .no accessory muscle use, no dullness to percussion  CARD:  RRR, no m/r/g  , no peripheral edema, pulses intact, no cyanosis or clubbing.  GI:   Soft & nt; nml bowel sounds; no organomegaly or masses detected.  Musco: Warm bil, no deformities or joint swelling noted.   Neuro: alert, no focal deficits noted.    Skin: Warm, no lesions or rashes         Assessment & Plan:

## 2015-03-14 NOTE — Assessment & Plan Note (Addendum)
CXR shows no acute process, exam is unrevealing  Follow up for Echo as planned with cards DDimer neg.   Plan  Stay BREO daily for now .  Follow up Dr. Halford Chessman in 3 months and As needed

## 2015-03-14 NOTE — Patient Instructions (Addendum)
Restart CPAP At bedtime   Goal is to wear for at least 4-6 hr each night.  Stay BREO daily for now .  Chest xray today  Follow up Dr. Halford Chessman in 3 months and As needed

## 2015-03-14 NOTE — Assessment & Plan Note (Addendum)
Restart CPAP At bedtime   Goal is to wear for at least 4-6 hr each night.  Follow up Dr. Halford Chessman in 3 months and As needed

## 2015-03-18 NOTE — Progress Notes (Signed)
Quick Note:  LVM for patient to return call. ______ 

## 2015-03-19 ENCOUNTER — Telehealth: Payer: Self-pay | Admitting: Pulmonary Disease

## 2015-03-19 NOTE — Progress Notes (Signed)
Quick Note:  Spoke with the pt and notified of results/recs ______

## 2015-03-19 NOTE — Telephone Encounter (Signed)
CXR shows emphysema , no acute process     Cont w/ ov recs    Spoke with pt and notified of results per TP. Pt verbalized understanding and denied any questions.

## 2015-03-20 ENCOUNTER — Encounter: Payer: Self-pay | Admitting: *Deleted

## 2015-03-21 ENCOUNTER — Telehealth: Payer: Self-pay | Admitting: Cardiology

## 2015-03-21 ENCOUNTER — Other Ambulatory Visit (INDEPENDENT_AMBULATORY_CARE_PROVIDER_SITE_OTHER): Payer: 59

## 2015-03-21 DIAGNOSIS — R7989 Other specified abnormal findings of blood chemistry: Secondary | ICD-10-CM | POA: Diagnosis not present

## 2015-03-21 NOTE — Telephone Encounter (Signed)
error 

## 2015-03-22 LAB — CBC
HCT: 54 % — ABNORMAL HIGH (ref 39.0–52.0)
Hemoglobin: 17.5 g/dL — ABNORMAL HIGH (ref 13.0–17.0)
MCHC: 32.4 g/dL (ref 30.0–36.0)
MCV: 93.1 fl (ref 78.0–100.0)
Platelets: 399 10*3/uL (ref 150.0–400.0)
RBC: 5.8 Mil/uL (ref 4.22–5.81)
RDW: 14.9 % (ref 11.5–15.5)
WBC: 13.9 10*3/uL — ABNORMAL HIGH (ref 4.0–10.5)

## 2015-03-25 ENCOUNTER — Encounter
Admission: RE | Admit: 2015-03-25 | Discharge: 2015-03-25 | Disposition: A | Discharge status: Home / Self Care | Payer: 59 | Source: Ambulatory Visit | Attending: Surgery | Admitting: Surgery

## 2015-03-25 ENCOUNTER — Telehealth: Payer: Self-pay

## 2015-03-25 DIAGNOSIS — K2950 Unspecified chronic gastritis without bleeding: Secondary | ICD-10-CM | POA: Diagnosis not present

## 2015-03-25 DIAGNOSIS — Z86010 Personal history of colonic polyps: Secondary | ICD-10-CM | POA: Diagnosis not present

## 2015-03-25 DIAGNOSIS — I252 Old myocardial infarction: Secondary | ICD-10-CM | POA: Diagnosis not present

## 2015-03-25 DIAGNOSIS — Z8719 Personal history of other diseases of the digestive system: Secondary | ICD-10-CM | POA: Diagnosis not present

## 2015-03-25 DIAGNOSIS — R12 Heartburn: Secondary | ICD-10-CM | POA: Diagnosis not present

## 2015-03-25 DIAGNOSIS — K219 Gastro-esophageal reflux disease without esophagitis: Secondary | ICD-10-CM | POA: Diagnosis present

## 2015-03-25 DIAGNOSIS — Z7951 Long term (current) use of inhaled steroids: Secondary | ICD-10-CM | POA: Diagnosis not present

## 2015-03-25 DIAGNOSIS — Z9862 Peripheral vascular angioplasty status: Secondary | ICD-10-CM | POA: Diagnosis not present

## 2015-03-25 DIAGNOSIS — Z1211 Encounter for screening for malignant neoplasm of colon: Secondary | ICD-10-CM | POA: Diagnosis present

## 2015-03-25 DIAGNOSIS — K641 Second degree hemorrhoids: Secondary | ICD-10-CM | POA: Diagnosis not present

## 2015-03-25 DIAGNOSIS — Z955 Presence of coronary angioplasty implant and graft: Secondary | ICD-10-CM | POA: Diagnosis not present

## 2015-03-25 DIAGNOSIS — G4733 Obstructive sleep apnea (adult) (pediatric): Secondary | ICD-10-CM | POA: Diagnosis not present

## 2015-03-25 DIAGNOSIS — J449 Chronic obstructive pulmonary disease, unspecified: Secondary | ICD-10-CM | POA: Diagnosis not present

## 2015-03-25 DIAGNOSIS — I255 Ischemic cardiomyopathy: Secondary | ICD-10-CM | POA: Diagnosis not present

## 2015-03-25 DIAGNOSIS — K5730 Diverticulosis of large intestine without perforation or abscess without bleeding: Secondary | ICD-10-CM | POA: Diagnosis not present

## 2015-03-25 DIAGNOSIS — I10 Essential (primary) hypertension: Secondary | ICD-10-CM | POA: Diagnosis not present

## 2015-03-25 DIAGNOSIS — Z79899 Other long term (current) drug therapy: Secondary | ICD-10-CM | POA: Diagnosis not present

## 2015-03-25 DIAGNOSIS — D122 Benign neoplasm of ascending colon: Secondary | ICD-10-CM | POA: Diagnosis not present

## 2015-03-25 DIAGNOSIS — Z7982 Long term (current) use of aspirin: Secondary | ICD-10-CM | POA: Diagnosis not present

## 2015-03-25 DIAGNOSIS — D124 Benign neoplasm of descending colon: Secondary | ICD-10-CM | POA: Diagnosis not present

## 2015-03-25 DIAGNOSIS — K210 Gastro-esophageal reflux disease with esophagitis: Secondary | ICD-10-CM | POA: Diagnosis not present

## 2015-03-25 DIAGNOSIS — R7303 Prediabetes: Secondary | ICD-10-CM | POA: Diagnosis not present

## 2015-03-25 DIAGNOSIS — I2510 Atherosclerotic heart disease of native coronary artery without angina pectoris: Secondary | ICD-10-CM | POA: Diagnosis not present

## 2015-03-25 DIAGNOSIS — E785 Hyperlipidemia, unspecified: Secondary | ICD-10-CM | POA: Diagnosis not present

## 2015-03-25 DIAGNOSIS — F17210 Nicotine dependence, cigarettes, uncomplicated: Secondary | ICD-10-CM | POA: Diagnosis not present

## 2015-03-25 HISTORY — DX: Reserved for inherently not codable concepts without codable children: IMO0001

## 2015-03-25 HISTORY — DX: Emphysema, unspecified: J43.9

## 2015-03-25 HISTORY — DX: Elevated white blood cell count, unspecified: D72.829

## 2015-03-25 LAB — CBC WITH DIFFERENTIAL/PLATELET
Basophils Absolute: 0.1 10*3/uL (ref 0–0.1)
Basophils Relative: 1 %
Eosinophils Absolute: 0.4 10*3/uL (ref 0–0.7)
Eosinophils Relative: 3 %
HCT: 51.8 % (ref 40.0–52.0)
Hemoglobin: 17.6 g/dL (ref 13.0–18.0)
Lymphocytes Relative: 22 %
Lymphs Abs: 3 10*3/uL (ref 1.0–3.6)
MCH: 29.8 pg (ref 26.0–34.0)
MCHC: 34 g/dL (ref 32.0–36.0)
MCV: 87.7 fL (ref 80.0–100.0)
Monocytes Absolute: 0.8 10*3/uL (ref 0.2–1.0)
Monocytes Relative: 6 %
Neutro Abs: 9.1 10*3/uL — ABNORMAL HIGH (ref 1.4–6.5)
Neutrophils Relative %: 68 %
Platelets: 358 10*3/uL (ref 150–440)
RBC: 5.91 MIL/uL — ABNORMAL HIGH (ref 4.40–5.90)
RDW: 14.6 % — ABNORMAL HIGH (ref 11.5–14.5)
WBC: 13.5 10*3/uL — ABNORMAL HIGH (ref 3.8–10.6)

## 2015-03-25 LAB — BASIC METABOLIC PANEL
Anion gap: 5 (ref 5–15)
BUN: 13 mg/dL (ref 6–20)
CO2: 28 mmol/L (ref 22–32)
Calcium: 9.2 mg/dL (ref 8.9–10.3)
Chloride: 106 mmol/L (ref 101–111)
Creatinine, Ser: 0.95 mg/dL (ref 0.61–1.24)
GFR calc Af Amer: >60 mL/min (ref >60)
GFR calc non Af Amer: >60 mL/min (ref >60)
Glucose, Bld: 93 mg/dL (ref 65–99)
Potassium: 4 mmol/L (ref 3.5–5.1)
Sodium: 139 mmol/L (ref 135–145)

## 2015-03-25 NOTE — Patient Instructions (Signed)
  Your procedure is scheduled on: April 02, 2015 (Wednesday) Report to Day Surgery.[facility]) To find out your arrival time please call [phone] between 1PM - 3PM on April 01, 2015 (Tuesday).  Remember: Instructions that are not followed completely may result in serious medical risk, up to and including death, or upon the discretion of your surgeon and anesthesiologist your surgery may need to be rescheduled.    __x_ 1. Do not eat food or drink liquids after midnight. No gum chewing or hard candies.     __x__ 2. No Alcohol for 24 hours before or after surgery.   ____ 3. Bring all medications with you on the day of surgery if instructed.    __x_ 4. Notify your doctor if there is any change in your medical condition     (cold, fever, infections).     Do not wear jewelry, make-up, hairpins, clips or nail polish.  Do not wear lotions, powders, or perfumes. You may wear deodorant.  Do not shave 48 hours prior to surgery. Men may shave face and neck.  Do not bring valuables to the hospital.    [facility] is not responsible for any belongings or valuables.               Contacts, dentures or bridgework may not be worn into surgery.  Leave your suitcase in the car. After surgery it may be brought to your room.  For patients admitted to the hospital, discharge time is determined by your                treatment team.   Patients discharged the day of surgery will not be allowed to drive home.   Please read over the following fact sheets that you were given:   Surgical Site Infection Prevention   ____ Take these medicines the morning of surgery with A SIP OF WATER:    1.   2.   3.   4.  5.  6.  ____ Fleet Enema (as directed)   _x___ Use CHG Soap as directed  __x__ Use inhalers on the day of surgery (Use Albuterol inhaler and if patient has purchased BREO inhaler by the day of surgery use it also) Bring inhalers to hospital  ____ Stop metformin 2 days prior to  surgery    ____ Take 1/2 of usual insulin dose the night before surgery and none on the morning of surgery.   __x__ Stop Coumadin/Plavix/aspirin on (Patient instructed to stop Aspirin and Effient five days prior to surgery by cardiologist onand surgeon)  _x___ Stop Anti-inflammatories on     ____ Stop supplements until after surgery.    _x___ Bring C-Pap to the hospital.

## 2015-03-25 NOTE — Telephone Encounter (Signed)
The patient has an echo scheduled for tomorrow. Once it is completed and reviewed. I will clear for surgery.  Tarri Fuller PAC

## 2015-03-25 NOTE — Telephone Encounter (Signed)
Pt was last seen by bryan hager on 03-21-15.  Procedures are mentioned in his note from that office visit, do not see clearance for those procedures. Will forward for his review and clearance.

## 2015-03-25 NOTE — Pre-Procedure Instructions (Signed)
Dr. Marcello Moores notified of anesthesia request , reviewed patient history and made Dr. Marcello Moores aware that surgeon office in process of having patient cleared for surgery. Dr. Marcello Moores ok with info and stated would like to know results of patient echo results that patient is having on February 15.

## 2015-03-25 NOTE — Telephone Encounter (Signed)
Pre Admit testing called, asking if pt is cleared for surgery on 2/17 and 2/23. Please advise.

## 2015-03-25 NOTE — Telephone Encounter (Signed)
Left message for gayle, will clear once echo has been completed.

## 2015-03-26 ENCOUNTER — Other Ambulatory Visit: Payer: Self-pay | Admitting: Internal Medicine

## 2015-03-26 ENCOUNTER — Ambulatory Visit (HOSPITAL_COMMUNITY): Payer: 59 | Attending: Cardiovascular Disease

## 2015-03-26 ENCOUNTER — Other Ambulatory Visit: Payer: Self-pay

## 2015-03-26 DIAGNOSIS — R0600 Dyspnea, unspecified: Secondary | ICD-10-CM | POA: Diagnosis not present

## 2015-03-26 DIAGNOSIS — D72829 Elevated white blood cell count, unspecified: Secondary | ICD-10-CM

## 2015-03-26 DIAGNOSIS — I517 Cardiomegaly: Secondary | ICD-10-CM | POA: Insufficient documentation

## 2015-03-26 DIAGNOSIS — I10 Essential (primary) hypertension: Secondary | ICD-10-CM | POA: Insufficient documentation

## 2015-03-26 DIAGNOSIS — G4733 Obstructive sleep apnea (adult) (pediatric): Secondary | ICD-10-CM | POA: Diagnosis not present

## 2015-03-26 DIAGNOSIS — Z87891 Personal history of nicotine dependence: Secondary | ICD-10-CM | POA: Insufficient documentation

## 2015-03-26 DIAGNOSIS — E785 Hyperlipidemia, unspecified: Secondary | ICD-10-CM | POA: Insufficient documentation

## 2015-03-26 DIAGNOSIS — R000 Tachycardia, unspecified: Secondary | ICD-10-CM | POA: Insufficient documentation

## 2015-03-26 NOTE — Discharge Instructions (Signed)

## 2015-03-28 ENCOUNTER — Ambulatory Visit: Payer: 59 | Admitting: Anesthesiology

## 2015-03-28 ENCOUNTER — Encounter
Admission: RE | Disposition: A | Discharge status: Home / Self Care | Payer: Self-pay | Source: Ambulatory Visit | Attending: Gastroenterology

## 2015-03-28 ENCOUNTER — Ambulatory Visit
Admission: RE | Admit: 2015-03-28 | Discharge: 2015-03-28 | Disposition: A | Discharge status: Home / Self Care | Payer: 59 | Source: Ambulatory Visit | Attending: Gastroenterology | Admitting: Gastroenterology

## 2015-03-28 DIAGNOSIS — D122 Benign neoplasm of ascending colon: Secondary | ICD-10-CM | POA: Insufficient documentation

## 2015-03-28 DIAGNOSIS — D124 Benign neoplasm of descending colon: Secondary | ICD-10-CM | POA: Diagnosis not present

## 2015-03-28 DIAGNOSIS — Z7982 Long term (current) use of aspirin: Secondary | ICD-10-CM | POA: Insufficient documentation

## 2015-03-28 DIAGNOSIS — G4733 Obstructive sleep apnea (adult) (pediatric): Secondary | ICD-10-CM | POA: Insufficient documentation

## 2015-03-28 DIAGNOSIS — Z860100 Personal history of colon polyps, unspecified: Secondary | ICD-10-CM | POA: Insufficient documentation

## 2015-03-28 DIAGNOSIS — I252 Old myocardial infarction: Secondary | ICD-10-CM | POA: Insufficient documentation

## 2015-03-28 DIAGNOSIS — Z7951 Long term (current) use of inhaled steroids: Secondary | ICD-10-CM | POA: Insufficient documentation

## 2015-03-28 DIAGNOSIS — F17210 Nicotine dependence, cigarettes, uncomplicated: Secondary | ICD-10-CM | POA: Insufficient documentation

## 2015-03-28 DIAGNOSIS — Z955 Presence of coronary angioplasty implant and graft: Secondary | ICD-10-CM | POA: Insufficient documentation

## 2015-03-28 DIAGNOSIS — K2100 Gastro-esophageal reflux disease with esophagitis, without bleeding: Secondary | ICD-10-CM | POA: Insufficient documentation

## 2015-03-28 DIAGNOSIS — I10 Essential (primary) hypertension: Secondary | ICD-10-CM | POA: Insufficient documentation

## 2015-03-28 DIAGNOSIS — Z9862 Peripheral vascular angioplasty status: Secondary | ICD-10-CM | POA: Insufficient documentation

## 2015-03-28 DIAGNOSIS — K5730 Diverticulosis of large intestine without perforation or abscess without bleeding: Secondary | ICD-10-CM | POA: Insufficient documentation

## 2015-03-28 DIAGNOSIS — R12 Heartburn: Secondary | ICD-10-CM | POA: Diagnosis not present

## 2015-03-28 DIAGNOSIS — K2970 Gastritis, unspecified, without bleeding: Secondary | ICD-10-CM

## 2015-03-28 DIAGNOSIS — K641 Second degree hemorrhoids: Secondary | ICD-10-CM | POA: Insufficient documentation

## 2015-03-28 DIAGNOSIS — J449 Chronic obstructive pulmonary disease, unspecified: Secondary | ICD-10-CM | POA: Insufficient documentation

## 2015-03-28 DIAGNOSIS — K2950 Unspecified chronic gastritis without bleeding: Secondary | ICD-10-CM | POA: Insufficient documentation

## 2015-03-28 DIAGNOSIS — Z79899 Other long term (current) drug therapy: Secondary | ICD-10-CM | POA: Insufficient documentation

## 2015-03-28 DIAGNOSIS — E785 Hyperlipidemia, unspecified: Secondary | ICD-10-CM | POA: Insufficient documentation

## 2015-03-28 DIAGNOSIS — I2510 Atherosclerotic heart disease of native coronary artery without angina pectoris: Secondary | ICD-10-CM | POA: Insufficient documentation

## 2015-03-28 DIAGNOSIS — I255 Ischemic cardiomyopathy: Secondary | ICD-10-CM | POA: Insufficient documentation

## 2015-03-28 DIAGNOSIS — Z8719 Personal history of other diseases of the digestive system: Secondary | ICD-10-CM | POA: Insufficient documentation

## 2015-03-28 DIAGNOSIS — Z1211 Encounter for screening for malignant neoplasm of colon: Secondary | ICD-10-CM | POA: Insufficient documentation

## 2015-03-28 DIAGNOSIS — R7303 Prediabetes: Secondary | ICD-10-CM | POA: Insufficient documentation

## 2015-03-28 HISTORY — PX: ESOPHAGOGASTRODUODENOSCOPY (EGD) WITH PROPOFOL: SHX5813

## 2015-03-28 HISTORY — PX: COLONOSCOPY WITH PROPOFOL: SHX5780

## 2015-03-28 HISTORY — PX: POLYPECTOMY: SHX5525

## 2015-03-28 SURGERY — COLONOSCOPY WITH PROPOFOL
Anesthesia: Monitor Anesthesia Care

## 2015-03-28 MED ORDER — LIDOCAINE HCL (CARDIAC) 20 MG/ML IV SOLN
INTRAVENOUS | Status: DC | PRN
  Administered 2015-03-28: 30 mg via INTRAVENOUS

## 2015-03-28 MED ORDER — PROPOFOL 10 MG/ML IV BOLUS
INTRAVENOUS | Status: DC | PRN
  Administered 2015-03-28: 40 mg via INTRAVENOUS
  Administered 2015-03-28: 20 mg via INTRAVENOUS
  Administered 2015-03-28: 80 mg via INTRAVENOUS
  Administered 2015-03-28: 40 mg via INTRAVENOUS
  Administered 2015-03-28: 20 mg via INTRAVENOUS
  Administered 2015-03-28: 30 mg via INTRAVENOUS
  Administered 2015-03-28: 20 mg via INTRAVENOUS
  Administered 2015-03-28: 40 mg via INTRAVENOUS

## 2015-03-28 MED ORDER — PANTOPRAZOLE SODIUM 40 MG PO TBEC: 40.0000 mg | DELAYED_RELEASE_TABLET | Freq: Every day | ORAL | Status: DC

## 2015-03-28 MED ORDER — STERILE WATER FOR IRRIGATION IR SOLN
Status: DC | PRN
  Administered 2015-03-28: 09:00:00

## 2015-03-28 MED ORDER — LACTATED RINGERS IV SOLN
INTRAVENOUS | Status: DC
  Administered 2015-03-28: 09:00:00 via INTRAVENOUS

## 2015-03-28 SURGICAL SUPPLY — 39 items
BALLN DILATOR 10-12 8 (BALLOONS)
BALLN DILATOR 12-15 8 (BALLOONS)
BALLN DILATOR 15-18 8 (BALLOONS)
BALLN DILATOR CRE 0-12 8 (BALLOONS)
BALLN DILATOR ESOPH 8 10 CRE (MISCELLANEOUS) IMPLANT
BALLOON DILATOR 12-15 8 (BALLOONS) IMPLANT
BALLOON DILATOR 15-18 8 (BALLOONS) IMPLANT
BALLOON DILATOR CRE 0-12 8 (BALLOONS) IMPLANT
BLOCK BITE 60FR ADLT L/F GRN (MISCELLANEOUS) ×3 IMPLANT
CANISTER SUCT 1200ML W/VALVE (MISCELLANEOUS) ×3 IMPLANT
FCP ESCP3.2XJMB 240X2.8X (MISCELLANEOUS) ×2
FORCEPS BIOP RAD 4 LRG CAP 4 (CUTTING FORCEPS) IMPLANT
FORCEPS BIOP RJ4 240 W/NDL (MISCELLANEOUS) ×1
FORCEPS ESCP3.2XJMB 240X2.8X (MISCELLANEOUS) ×2 IMPLANT
GOWN CVR UNV OPN BCK APRN NK (MISCELLANEOUS) ×4 IMPLANT
GOWN ISOL THUMB LOOP REG UNIV (MISCELLANEOUS) ×2
HEMOCLIP INSTINCT (CLIP) IMPLANT
INJECTOR VARIJECT VIN23 (MISCELLANEOUS) IMPLANT
KIT CO2 TUBING (TUBING) IMPLANT
KIT DEFENDO VALVE AND CONN (KITS) IMPLANT
KIT ENDO PROCEDURE OLY (KITS) ×3 IMPLANT
LIGATOR MULTIBAND 6SHOOTER MBL (MISCELLANEOUS) IMPLANT
MARKER SPOT ENDO TATTOO 5ML (MISCELLANEOUS) IMPLANT
PAD GROUND ADULT SPLIT (MISCELLANEOUS) IMPLANT
SNARE SHORT THROW 13M SML OVAL (MISCELLANEOUS) IMPLANT
SNARE SHORT THROW 30M LRG OVAL (MISCELLANEOUS) IMPLANT
SPOT EX ENDOSCOPIC TATTOO (MISCELLANEOUS)
SUCTION POLY TRAP 4CHAMBER (MISCELLANEOUS) ×6 IMPLANT
SYR INFLATION 60ML (SYRINGE) IMPLANT
TRAP SUCTION POLY (MISCELLANEOUS) IMPLANT
TUBING CONN 6MMX3.1M (TUBING)
TUBING SUCTION CONN 0.25 STRL (TUBING) IMPLANT
UNDERPAD 30X60 958B10 (PK) (MISCELLANEOUS) IMPLANT
VALVE BIOPSY ENDO (VALVE) IMPLANT
VARIJECT INJECTOR VIN23 (MISCELLANEOUS)
WATER AUXILLARY (MISCELLANEOUS) IMPLANT
WATER STERILE IRR 250ML POUR (IV SOLUTION) ×3 IMPLANT
WATER STERILE IRR 500ML POUR (IV SOLUTION) IMPLANT
WIRE CRE 18-20MM 8CM F G (MISCELLANEOUS) IMPLANT

## 2015-03-28 NOTE — Transfer of Care (Signed)
Immediate Anesthesia Transfer of Care Note  Patient: Troy Parrish  Procedure(s) Performed: Procedure(s): COLONOSCOPY WITH PROPOFOL (N/A) ESOPHAGOGASTRODUODENOSCOPY (EGD) WITH PROPOFOL (N/A) POLYPECTOMY  Patient Location: PACU  Anesthesia Type: MAC  Level of Consciousness: awake, alert  and patient cooperative  Airway and Oxygen Therapy: Patient Spontanous Breathing and Patient connected to supplemental oxygen  Post-op Assessment: Post-op Vital signs reviewed, Patient's Cardiovascular Status Stable, Respiratory Function Stable, Patent Airway and No signs of Nausea or vomiting  Post-op Vital Signs: Reviewed and stable  Complications: No apparent anesthesia complications

## 2015-03-28 NOTE — H&P (Signed)
[facility]  [address] [place], [place] [address] Phone: [phone] Fax : [phone]  Primary Care Physician:  Webb Silversmith, NP Primary Gastroenterologist:  Dr. Allen Norris  Pre-Procedure History & Physical: HPI:  Troy Parrish is a 55 y.o. male is here for an endoscopy and colonoscopy.   Past Medical History  Diagnosis Date  . Essential hypertension 07/07/2011  . History of colon polyps   . Hyperlipidemia with target LDL less than 70 07/07/2011  . Recurrent upper respiratory infection (URI)   . CAD S/P percutaneous coronary angioplasty 07/07/2011; 6 & 10 2015    S/P PCI to all 3 major vessels;a)  Ant STEMI 2001- BMS-LAD x2 -->(redo PCI 6/'15 -- pLAD Xience DES 2.5 x 12- 2.75 mm, mLAD 2.25 x 12 - 2.7 mm), b) '06 UA --> Cx- OM DES; c) 2013 Inf MI BMS mRCA --> d) 10/'15 PCI dRCA Promus P DES 4.0 x 16 (4.25 mm); PTCA of RPL2 (2.0 mm) &RPDA (2.25 mm) 11/2013  . ST elevation myocardial infarction (STEMI) of anterior wall ([place]) 2001    History of -- 2 stents in early and distal mid LAD; prior cardiologist was Dr. Remi Haggard in [place]  . ST elevation myocardial infarction (STEMI) of inferior wall ([place]) 07/07/2011    Occluded RCA 1.24P80 INTEGRITY; Echo 07/2013: EF 45-50%, Inferior & Posterolateral HK.   Marland Kitchen Unstable angina ([place])  2006; June 2015    Cx-OM - PCI 2.5 mm 13 mm Cypher DES [name], [place]) ; 07/2013: Severe ISR of both prox & Distal LAD stentS --> 2 DES stents (1 at prox edge of the proximal stent, 2nd covers the entire distal stent as well as proximal and distal edge stenose)   . COPD (chronic obstructive pulmonary disease) ([place])   . Former heavy tobacco smoker      quit in May 2015 after multiple attempts at trying to quit before   . Glucose intolerance (pre-diabetes) June 2015     hemoglobin A1c 6.6  . Metabolic syndrome     Pre-diabetes, hypertension and truncal obesity as well as dyslipidemia  . Upper respiratory infection   . Heart murmur   .  Pneumonia "several times"  . Chronic bronchitis ([place]) "used to get it q yr"  . OSA on CPAP   . GERD (gastroesophageal reflux disease)   . History of stomach ulcers   . Headache     "Imdur related; stopped taking it; headaches went away" (11/15/2013)  . Seizures ([place]) "several"    "last one was ~ 2011" (11/15/2013)  . Shortness of breath dyspnea   . Elevated WBC count   . Emphysema of lung [facility])     Past Surgical History  Procedure Laterality Date  . Transthoracic echocardiogram  07/10/2011    EF 50-55% ,LV graded 1 diastolic dysf.   . Nuclear exercise study  11/30/2011    EF 45% ,exercise 10 METS, infarct\scar w mild perinfarct ischemia -basal inferolat and mid inferolat region  . Met/cpet  11/09/2011    submax. effort 1.05 RER peak V02 was 51% ,chronotropic incomp.hrt rate lows 80s to 100  . Percutaneous coronary stent intervention (pci-s)   June 2015    Distal mid LAD: Xience Alpine DES 2.25 mm x 28 mm (overlapping proximal and distal edge of previous stent) - 2.7 mm; proximal LAD 2.5 mm x 12 mm Xience Alpine DES (2.8 mm);; RCA 60-70% stenosis planned staged procedure  . Transthoracic echocardiogram  07/31/2013    LVEF 45-50. Inferior posterior hypokinesis  with mild dilation. Apparent normal diastolic pressures  . Right heart cath  June 2015    Normal pressures with severely reduced cardiac output and index. (3.25/1.55)  . Cardiac catheterization  07/07/2011     inferolat post LV infarct ST-elevation MI  . Left heart catheterization with coronary angiogram N/A 07/07/2011    Procedure: LEFT HEART CATHETERIZATION WITH CORONARY ANGIOGRAM;  Surgeon: Leonie Man, MD;  Location: [facility] CATH LAB;  Service: Cardiovascular;  Laterality: N/A;  . Percutaneous coronary stent intervention (pci-s)  07/07/2011    Procedure: PERCUTANEOUS CORONARY STENT INTERVENTION (PCI-S);  Surgeon: Leonie Man, MD;  Location: [facility] CATH LAB;  Service: Cardiovascular;;  . Left and right heart catheterization with  coronary angiogram N/A 07/30/2013    Procedure: LEFT AND RIGHT HEART CATHETERIZATION WITH CORONARY ANGIOGRAM;  Surgeon: Leonie Man, MD;  Location: [facility] CATH LAB;  Service: Cardiovascular;  Laterality: N/A;  . Percutaneous coronary stent intervention (pci-s)  07/30/2013    Procedure: PERCUTANEOUS CORONARY STENT INTERVENTION (PCI-S);  Surgeon: Leonie Man, MD;  Location: [facility] CATH LAB;  Service: Cardiovascular;;  . Left heart cath  08/03/2013    Procedure: LEFT HEART CATH;  Surgeon: Leonie Man, MD;  Location: [facility] CATH LAB;  Service: Cardiovascular;;  . Percutaneous coronary stent intervention (pci-s) N/A 11/15/2013    Procedure: PERCUTANEOUS CORONARY STENT INTERVENTION (PCI-S);  Surgeon: Leonie Man, MD;  Location: [facility] CATH LAB;  Service: Cardiovascular;  Laterality: N/A;  . Coronary angioplasty with stent placement  06/2011    occluded RCA ,Integrity Resolute DES 2.75 x 18 mm stent dilated 3.1 mm  . Coronary angioplasty with stent placement  2001    ANTERIOR mi with a  PCI to LAD   . Coronary angioplasty with stent placement  2006    [place] by Dr Dorris Fetch -lesion lft circ/OM1 with 2.5 x 72m Cypher DES  . Coronary angioplasty with stent placement  11/15/2013    Promus Premier DES 4.0 mm x 16 mm (4.25 mm) dRCA, 2.0 mm Cutting PTCA of RPL2 Ostium & POBA of mRPDA  . Coronary angioplasty with stent placement  2016    x3 in 2016    Prior to Admission medications   Medication Sig Start Date End Date Taking? Authorizing Provider  albuterol (PROVENTIL HFA) 108 (90 BASE) MCG/ACT inhaler Inhale 1 puff into the lungs every 6 (six) hours as needed. 07/01/14  Yes RJearld Fenton NP  aspirin EC 81 MG tablet Take 162 mg by mouth at bedtime.   Yes Historical Provider, MD  azelastine (OPTIVAR) 0.05 % ophthalmic solution Place 1 drop into both eyes daily. Reported on 03/14/2015 03/06/15  Yes Historical Provider, MD  buPROPion (WELLBUTRIN XL) 150 MG 24 hr tablet Take 150 mg by mouth daily.   Yes  Historical Provider, MD  clobetasol cream (TEMOVATE) 07.37% APPLY 1 APPLICATION TOPICALLY 2 (TWO) TIMES DAILY. 12/10/14  Yes RJearld Fenton NP  CRESTOR 20 MG tablet TAKE 1 TABLET (20 MG TOTAL) BY MOUTH DAILY AT 6 PM. 10/21/14  Yes DLeonie Man MD  EFFIENT 10 MG TABS tablet TAKE 1 TABLET (10 MG TOTAL) BY MOUTH DAILY. 12/06/14  Yes DLeonie Man MD  fluticasone ([facility] 50 MCG/ACT nasal spray Place 2 sprays into both nostrils daily. 03/06/15  Yes Historical Provider, MD  isosorbide mononitrate (IMDUR) 30 MG 24 hr tablet Take 1 tablet (30 mg total) by mouth daily. 08/07/13  Yes LIsaiah Serge NP  losartan (COZAAR) 25 MG tablet TAKE  1 TABLET (25 MG TOTAL) BY MOUTH DAILY. 01/23/14  Yes Leonie Man, MD  metoprolol succinate (TOPROL-XL) 100 MG 24 hr tablet TAKE 1 TABLET BY MOUTH DAILY. TAKE WITH OR IMMEDIATELY FOLLOWING A MEAL. 12/06/14  Yes Leonie Man, MD  niacin (NIASPAN) 500 MG CR tablet Take 500 mg by mouth at bedtime. Takes 30 minutes after taking aspirin.   Yes Historical Provider, MD  nitroGLYCERIN (NITROSTAT) 0.4 MG SL tablet Place 1 tablet (0.4 mg total) under the tongue every 5 (five) minutes as needed for chest pain. 11/07/13  Yes Leonie Man, MD  ranolazine (RANEXA) 500 MG 12 hr tablet Take 1 tablet (500 mg total) by mouth 2 (two) times daily. Patient taking differently: Take 500 mg by mouth every evening.  11/07/13  Yes Leonie Man, MD    Allergies as of 03/05/2015 - Review Complete 03/04/2015  Allergen Reaction Noted  . Niacin and related Hives and Itching 12/31/2013    Family History  Problem Relation Age of Onset  . Coronary artery disease Father   . Heart disease Father   . Stroke Father   . Hypertension Father   . Hyperlipidemia Mother   . Diabetes Mother   . Hyperlipidemia Maternal Grandmother   . Diabetes Maternal Grandmother   . Hyperlipidemia Maternal Grandfather   . Diabetes Maternal Grandfather   . Heart disease Paternal Grandmother   . Heart  disease Paternal Grandfather   . Cancer Maternal Aunt     Social History   Social History  . Marital Status: Married    Spouse Name: N/A  . Number of Children: N/A  . Years of Education: N/A   Occupational History  . Not on file.   Social History Main Topics  . Smoking status: Former Smoker -- 0.50 packs/day for 40 years    Types: Cigarettes    Quit date: 06/19/2013  . Smokeless tobacco: Never Used     Comment: Quit with Bupropion '150mg'$   . Alcohol Use: 0.6 oz/week    1 Cans of beer per week     Comment: rare  . Drug Use: No  . Sexual Activity: Yes   Other Topics Concern  . Not on file   Social History Narrative   He is a married father of 8, grandfather of 77. He does not really get routine exercise.    He is down to 5 or 6 cigarettes a day. He is very seriously wanting to quit. This is a significant cutback for him, but he has pretty much been told he needs to stop by his wife, and so he fully intends to do so. He is willing to try the patches or whatever. He has a social alcoholic beverage every now and then.     Review of Systems: See HPI, otherwise negative ROS  Physical Exam: BP 113/87 mmHg  Pulse 112  Temp(Src) 98.1 F (36.7 C) (Temporal)  Resp 16  Ht '5\' 11"'$  (1.803 m)  Wt 178 lb (80.74 kg)  BMI 24.84 kg/m2  SpO2 99% General:   Alert,  pleasant and cooperative in NAD Head:  Normocephalic and atraumatic. Neck:  Supple; no masses or thyromegaly. Lungs:  Clear throughout to auscultation.    Heart:  Regular rate and rhythm. Abdomen:  Soft, nontender and nondistended. Normal bowel sounds, without guarding, and without rebound.   Neurologic:  Alert and  oriented x4;  grossly normal neurologically.  Impression/Plan: Troy Parrish is here for an endoscopy and colonoscopy to be performed for  history of colon polyps and gerd.  Risks, benefits, limitations, and alternatives regarding  endoscopy and colonoscopy have been reviewed with the patient.  Questions have been  answered.  All parties agreeable.   Ollen Bowl, MD  03/28/2015, 8:45 AM

## 2015-03-28 NOTE — Op Note (Signed)
[facility] Patient Name: Troy Parrish Procedure Date: 03/28/2015 8:50 AM MRN: [id number] Account #: [id number] Date of Birth: Jun 19, 1960 Admit Type: Outpatient Age: 55 Room: [facility] OR ROOM 01 Gender: Male Note Status: Finalized Procedure:            Colonoscopy Indications:          High risk colon cancer surveillance: Personal history                        of colonic polyps Providers:            Lucilla Lame, MD Referring MD:         Jearld Fenton (Referring MD) Medicines:            Propofol per Anesthesia Complications:        No immediate complications. Procedure:            Pre-Anesthesia Assessment:                       - Prior to the procedure, a History and Physical was                        performed, and patient medications and allergies were                        reviewed. The patient's tolerance of previous                        anesthesia was also reviewed. The risks and benefits of                        the procedure and the sedation options and risks were                        discussed with the patient. All questions were                        answered, and informed consent was obtained. Prior                        Anticoagulants: The patient has taken no previous                        anticoagulant or antiplatelet agents. ASA Grade                        Assessment: II - A patient with mild systemic disease.                        After reviewing the risks and benefits, the patient was                        deemed in satisfactory condition to undergo the                        procedure.                       After obtaining informed consent, the colonoscope was  passed under direct vision. Throughout the procedure,                        the patient's blood pressure, pulse, and oxygen                        saturations were monitored continuously. The Olympus CF                        H180AL  colonoscope (S#: [id number]) was introduced through                        the anus and advanced to the the cecum, identified by                        appendiceal orifice and ileocecal valve. The                        colonoscopy was performed without difficulty. The                        patient tolerated the procedure well. The quality of                        the bowel preparation was excellent. Findings:      The perianal and digital rectal examinations were normal.      A 3 mm polyp was found in the ascending colon. The polyp was sessile.       The polyp was removed with a cold biopsy forceps. Resection and       retrieval were complete.      A 9 mm polyp was found in the descending colon. The polyp was       pedunculated. The polyp was removed with a hot snare. Resection and       retrieval were complete.      A 5 mm polyp was found in the descending colon. The polyp was sessile.       The polyp was removed with a cold snare. Resection and retrieval were       complete.      Multiple small-mouthed diverticula were found in the sigmoid colon.      Non-bleeding internal hemorrhoids were found during retroflexion. The       hemorrhoids were Grade II (internal hemorrhoids that prolapse but reduce       spontaneously). Impression:           - One 3 mm polyp in the ascending colon, removed with a                        cold biopsy forceps. Resected and retrieved.                       - One 9 mm polyp in the descending colon, removed with                        a hot snare. Resected and retrieved.                       - One 5 mm polyp in the descending colon, removed with  a cold snare. Resected and retrieved.                       - Diverticulosis in the sigmoid colon.                       - Non-bleeding internal hemorrhoids. Recommendation:       - Repeat colonoscopy in 5 years if polyp adenoma and 10                        years if hyperplastic Procedure  Code(s):    --- Professional ---                       [phone], Colonoscopy, flexible; with removal of tumor(s),                        polyp(s), or other lesion(s) by snare technique                       45380, 60, Colonoscopy, flexible; with biopsy, single                        or multiple Diagnosis Code(s):    --- Professional ---                       Z86.010, Personal history of colonic polyps                       D12.2, Benign neoplasm of ascending colon                       D12.4, Benign neoplasm of descending colon CPT copyright 2016 American Medical Association. All rights reserved. The codes documented in this report are preliminary and upon coder review may  be revised to meet current compliance requirements. Lucilla Lame, MD 03/28/2015 9:26:32 AM This report has been signed electronically. Number of Addenda: 0 Note Initiated On: 03/28/2015 8:50 AM Scope Withdrawal Time: 0 hours 10 minutes 6 seconds  Total Procedure Duration: 0 hours 12 minutes 52 seconds       [facility]

## 2015-03-28 NOTE — Anesthesia Procedure Notes (Signed)
Procedure Name: MAC Performed by: Elly Haffey Pre-anesthesia Checklist: Patient identified, Emergency Drugs available, Suction available, Patient being monitored and Timeout performed Patient Re-evaluated:Patient Re-evaluated prior to inductionOxygen Delivery Method: Nasal cannula       

## 2015-03-28 NOTE — Anesthesia Preprocedure Evaluation (Addendum)
Anesthesia Evaluation  Patient identified by MRN, date of birth, ID band  Reviewed: NPO status   History of Anesthesia Complications Negative for: history of anesthetic complications  Airway Mallampati: II  TM Distance: >3 FB Neck ROM: full    Dental  (+) Chipped   Pulmonary neg pulmonary ROS, sleep apnea and Continuous Positive Airway Pressure Ventilation , COPD (mild),  COPD inhaler, former smoker,    Pulmonary exam normal        Cardiovascular Exercise Tolerance: Good hypertension, (-) angina+ CAD, + Past MI (2013) and + Cardiac Stents (x8 stents; 08/2014)  Normal cardiovascular exam  Cardiomyopathy, ischemic; EF~45%    Neuro/Psych  Headaches, Seizures - (2011),  Anxiety negative psych ROS   GI/Hepatic Neg liver ROS, GERD  Controlled,  Endo/Other  negative endocrine ROS  Renal/GU negative Renal ROS  negative genitourinary   Musculoskeletal   Abdominal   Peds  Hematology negative hematology ROS (+)   Anesthesia Other Findings Last effient: 5 days ago.  Cards cleared: 03/2015: dr. Samara Snide.   Reproductive/Obstetrics                            Anesthesia Physical Anesthesia Plan  ASA: III  Anesthesia Plan: MAC   Post-op Pain Management:    Induction:   Airway Management Planned:   Additional Equipment:   Intra-op Plan:   Post-operative Plan:   Informed Consent: I have reviewed the patients History and Physical, chart, labs and discussed the procedure including the risks, benefits and alternatives for the proposed anesthesia with the patient or authorized representative who has indicated his/her understanding and acceptance.     Plan Discussed with: CRNA  Anesthesia Plan Comments:         Anesthesia Quick Evaluation

## 2015-03-28 NOTE — Op Note (Signed)
[facility] Patient Name: Troy Parrish Procedure Date: 03/28/2015 8:50 AM MRN: [id number] Account #: [id number] Date of Birth: [birth year] Admit Type: Outpatient Age: 55 Room: [facility] OR ROOM 01 Gender: Male Note Status: Finalized Procedure:            Upper GI endoscopy Indications:          Heartburn Providers:            Lucilla Lame, MD Referring MD:         Jearld Fenton (Referring MD) Medicines:            Propofol per Anesthesia Complications:        No immediate complications. Procedure:            Pre-Anesthesia Assessment:                       - Prior to the procedure, a History and Physical was                        performed, and patient medications and allergies were                        reviewed. The patient's tolerance of previous                        anesthesia was also reviewed. The risks and benefits of                        the procedure and the sedation options and risks were                        discussed with the patient. All questions were                        answered, and informed consent was obtained. Prior                        Anticoagulants: The patient has taken no previous                        anticoagulant or antiplatelet agents. ASA Grade                        Assessment: II - A patient with mild systemic disease.                        After reviewing the risks and benefits, the patient was                        deemed in satisfactory condition to undergo the                        procedure.                       After obtaining informed consent, the endoscope was                        passed under direct vision. Throughout the procedure,  the patient's blood pressure, pulse, and oxygen                        saturations were monitored continuously. The Olympus                        GIF H180J colonscope [id number] was introduced                        through the mouth, and  advanced to the second part of                        duodenum. The upper GI endoscopy was accomplished                        without difficulty. The patient tolerated the procedure                        well. Findings:      LA Grade A (one or more mucosal breaks less than 5 mm, not extending       between tops of 2 mucosal folds) esophagitis with no bleeding was found       at the gastroesophageal junction.      Localized mild inflammation characterized by erythema was found in the       gastric antrum. Biopsies were taken with a cold forceps for histology.      The examined duodenum was normal. Impression:           - LA Grade A reflux esophagitis.                       - Gastritis. Biopsied.                       - Normal examined duodenum. Recommendation:       - Await pathology results.                       - Perform a colonoscopy today. Procedure Code(s):    --- Professional ---                       [phone], Esophagogastroduodenoscopy, flexible, transoral;                        with biopsy, single or multiple Diagnosis Code(s):    --- Professional ---                       R12, Heartburn                       K21.0, Gastro-esophageal reflux disease with esophagitis                       K29.70, Gastritis, unspecified, without bleeding CPT copyright 2016 American Medical Association. All rights reserved. The codes documented in this report are preliminary and upon coder review may  be revised to meet current compliance requirements. Lucilla Lame, MD 03/28/2015 9:07:55 AM This report has been signed electronically. Number of Addenda: 0 Note Initiated On: 03/28/2015 8:50 AM Total Procedure Duration: 0 hours 2 minutes 27 seconds       [facility]

## 2015-03-28 NOTE — Anesthesia Postprocedure Evaluation (Signed)
Anesthesia Post Note  Patient: Troy Parrish  Procedure(s) Performed: Procedure(s) (LRB): COLONOSCOPY WITH PROPOFOL (N/A) ESOPHAGOGASTRODUODENOSCOPY (EGD) WITH PROPOFOL (N/A) POLYPECTOMY  Patient location during evaluation: PACU Anesthesia Type: MAC Level of consciousness: awake and alert Pain management: pain level controlled Vital Signs Assessment: post-procedure vital signs reviewed and stable Respiratory status: spontaneous breathing, nonlabored ventilation, respiratory function stable and patient connected to nasal cannula oxygen Cardiovascular status: stable and blood pressure returned to baseline Anesthetic complications: no    Jodell Weitman

## 2015-03-31 ENCOUNTER — Encounter: Payer: Self-pay | Admitting: Gastroenterology

## 2015-03-31 NOTE — Pre-Procedure Instructions (Signed)
Medical clearance on chart. 

## 2015-04-01 ENCOUNTER — Encounter: Payer: Self-pay | Admitting: Gastroenterology

## 2015-04-02 ENCOUNTER — Ambulatory Visit: Payer: 59 | Admitting: Certified Registered"

## 2015-04-02 ENCOUNTER — Encounter
Admission: RE | Disposition: A | Discharge status: Home / Self Care | Payer: Self-pay | Source: Ambulatory Visit | Attending: Surgery

## 2015-04-02 ENCOUNTER — Encounter: Payer: Self-pay | Admitting: *Deleted

## 2015-04-02 ENCOUNTER — Encounter: Payer: Self-pay | Admitting: Surgery

## 2015-04-02 ENCOUNTER — Ambulatory Visit
Admission: RE | Admit: 2015-04-02 | Discharge: 2015-04-02 | Disposition: A | Discharge status: Home / Self Care | Payer: 59 | Source: Ambulatory Visit | Attending: Surgery | Admitting: Surgery

## 2015-04-02 DIAGNOSIS — J449 Chronic obstructive pulmonary disease, unspecified: Secondary | ICD-10-CM | POA: Insufficient documentation

## 2015-04-02 DIAGNOSIS — I252 Old myocardial infarction: Secondary | ICD-10-CM | POA: Insufficient documentation

## 2015-04-02 DIAGNOSIS — Z87891 Personal history of nicotine dependence: Secondary | ICD-10-CM | POA: Diagnosis not present

## 2015-04-02 DIAGNOSIS — Z9889 Other specified postprocedural states: Secondary | ICD-10-CM | POA: Insufficient documentation

## 2015-04-02 DIAGNOSIS — K429 Umbilical hernia without obstruction or gangrene: Secondary | ICD-10-CM | POA: Diagnosis not present

## 2015-04-02 DIAGNOSIS — Z8349 Family history of other endocrine, nutritional and metabolic diseases: Secondary | ICD-10-CM | POA: Diagnosis not present

## 2015-04-02 DIAGNOSIS — Z79899 Other long term (current) drug therapy: Secondary | ICD-10-CM | POA: Insufficient documentation

## 2015-04-02 DIAGNOSIS — E785 Hyperlipidemia, unspecified: Secondary | ICD-10-CM | POA: Insufficient documentation

## 2015-04-02 DIAGNOSIS — K219 Gastro-esophageal reflux disease without esophagitis: Secondary | ICD-10-CM | POA: Insufficient documentation

## 2015-04-02 DIAGNOSIS — Z86010 Personal history of colonic polyps: Secondary | ICD-10-CM | POA: Insufficient documentation

## 2015-04-02 DIAGNOSIS — K420 Umbilical hernia with obstruction, without gangrene: Secondary | ICD-10-CM | POA: Diagnosis not present

## 2015-04-02 DIAGNOSIS — Z8669 Personal history of other diseases of the nervous system and sense organs: Secondary | ICD-10-CM | POA: Diagnosis not present

## 2015-04-02 DIAGNOSIS — Z8711 Personal history of peptic ulcer disease: Secondary | ICD-10-CM | POA: Insufficient documentation

## 2015-04-02 DIAGNOSIS — Z888 Allergy status to other drugs, medicaments and biological substances status: Secondary | ICD-10-CM | POA: Diagnosis not present

## 2015-04-02 DIAGNOSIS — E8881 Metabolic syndrome: Secondary | ICD-10-CM | POA: Insufficient documentation

## 2015-04-02 DIAGNOSIS — Z823 Family history of stroke: Secondary | ICD-10-CM | POA: Diagnosis not present

## 2015-04-02 DIAGNOSIS — Z8249 Family history of ischemic heart disease and other diseases of the circulatory system: Secondary | ICD-10-CM | POA: Insufficient documentation

## 2015-04-02 DIAGNOSIS — Z833 Family history of diabetes mellitus: Secondary | ICD-10-CM | POA: Insufficient documentation

## 2015-04-02 DIAGNOSIS — G4733 Obstructive sleep apnea (adult) (pediatric): Secondary | ICD-10-CM | POA: Diagnosis not present

## 2015-04-02 DIAGNOSIS — Z7951 Long term (current) use of inhaled steroids: Secondary | ICD-10-CM | POA: Diagnosis not present

## 2015-04-02 DIAGNOSIS — Z955 Presence of coronary angioplasty implant and graft: Secondary | ICD-10-CM | POA: Diagnosis not present

## 2015-04-02 DIAGNOSIS — I10 Essential (primary) hypertension: Secondary | ICD-10-CM | POA: Insufficient documentation

## 2015-04-02 DIAGNOSIS — I2510 Atherosclerotic heart disease of native coronary artery without angina pectoris: Secondary | ICD-10-CM | POA: Insufficient documentation

## 2015-04-02 HISTORY — PX: UMBILICAL HERNIA REPAIR: SHX196

## 2015-04-02 HISTORY — PX: INSERTION OF MESH: SHX5868

## 2015-04-02 SURGERY — REPAIR, HERNIA, UMBILICAL, ADULT
Anesthesia: General

## 2015-04-02 MED ORDER — FENTANYL CITRATE (PF) 100 MCG/2ML IJ SOLN
INTRAMUSCULAR | Status: AC
  Administered 2015-04-02: 25 ug via INTRAVENOUS
  Filled 2015-04-02: qty 2

## 2015-04-02 MED ORDER — MIDAZOLAM HCL 2 MG/2ML IJ SOLN
INTRAMUSCULAR | Status: DC | PRN
  Administered 2015-04-02: 2 mg via INTRAVENOUS

## 2015-04-02 MED ORDER — SODIUM CHLORIDE 0.9 % IJ SOLN
INTRAMUSCULAR | Status: AC
  Filled 2015-04-02: qty 10

## 2015-04-02 MED ORDER — LACTATED RINGERS IV SOLN
INTRAVENOUS | Status: DC
  Administered 2015-04-02: 07:00:00 via INTRAVENOUS

## 2015-04-02 MED ORDER — FENTANYL CITRATE (PF) 100 MCG/2ML IJ SOLN
25.0000 ug | INTRAMUSCULAR | Status: DC | PRN
  Administered 2015-04-02 (×2): 25 ug via INTRAVENOUS

## 2015-04-02 MED ORDER — LIDOCAINE HCL (CARDIAC) 20 MG/ML IV SOLN
INTRAVENOUS | Status: DC | PRN
  Administered 2015-04-02: 50 mg via INTRAVENOUS

## 2015-04-02 MED ORDER — HEPARIN SODIUM (PORCINE) 5000 UNIT/ML IJ SOLN
5000.0000 [IU] | Freq: Once | INTRAMUSCULAR | Status: AC
  Administered 2015-04-02: 5000 [IU] via SUBCUTANEOUS

## 2015-04-02 MED ORDER — PROMETHAZINE HCL 25 MG/ML IJ SOLN: 6.2500 mg | INTRAMUSCULAR | Status: DC | PRN

## 2015-04-02 MED ORDER — EPHEDRINE SULFATE 50 MG/ML IJ SOLN
INTRAMUSCULAR | Status: AC
  Filled 2015-04-02: qty 1

## 2015-04-02 MED ORDER — ROCURONIUM BROMIDE 100 MG/10ML IV SOLN
INTRAVENOUS | Status: DC | PRN
  Administered 2015-04-02: 50 mg via INTRAVENOUS

## 2015-04-02 MED ORDER — DIPHENHYDRAMINE HCL 50 MG/ML IJ SOLN
25.0000 mg | Freq: Once | INTRAMUSCULAR | Status: AC
  Administered 2015-04-02: 25 mg via INTRAVENOUS

## 2015-04-02 MED ORDER — FAMOTIDINE 20 MG PO TABS
ORAL_TABLET | ORAL | Status: AC
  Filled 2015-04-02: qty 1

## 2015-04-02 MED ORDER — PROPOFOL 10 MG/ML IV BOLUS
INTRAVENOUS | Status: DC | PRN
  Administered 2015-04-02: 200 mg via INTRAVENOUS

## 2015-04-02 MED ORDER — OXYCODONE-ACETAMINOPHEN 5-325 MG PO TABS: 1.0000 | ORAL_TABLET | ORAL | Status: DC | PRN

## 2015-04-02 MED ORDER — DEXAMETHASONE SODIUM PHOSPHATE 4 MG/ML IJ SOLN
INTRAMUSCULAR | Status: DC | PRN
  Administered 2015-04-02: 10 mg via INTRAVENOUS

## 2015-04-02 MED ORDER — SUGAMMADEX SODIUM 200 MG/2ML IV SOLN
INTRAVENOUS | Status: DC | PRN
  Administered 2015-04-02: 200 mg via INTRAVENOUS

## 2015-04-02 MED ORDER — DEXAMETHASONE SODIUM PHOSPHATE 10 MG/ML IJ SOLN
10.0000 mg | Freq: Once | INTRAMUSCULAR | Status: AC
  Administered 2015-04-02: 10 mg via INTRAVENOUS

## 2015-04-02 MED ORDER — BUPIVACAINE-EPINEPHRINE (PF) 0.25% -1:200000 IJ SOLN
INTRAMUSCULAR | Status: AC
  Filled 2015-04-02: qty 30

## 2015-04-02 MED ORDER — OXYCODONE HCL 5 MG/5ML PO SOLN: 5.0000 mg | Freq: Once | ORAL | Status: DC | PRN

## 2015-04-02 MED ORDER — DEXAMETHASONE SODIUM PHOSPHATE 10 MG/ML IJ SOLN
INTRAMUSCULAR | Status: AC
  Filled 2015-04-02: qty 1

## 2015-04-02 MED ORDER — OXYCODONE HCL 5 MG PO TABS: 5.0000 mg | ORAL_TABLET | Freq: Once | ORAL | Status: DC | PRN

## 2015-04-02 MED ORDER — CEFAZOLIN SODIUM-DEXTROSE 2-3 GM-% IV SOLR
INTRAVENOUS | Status: AC
  Filled 2015-04-02: qty 50

## 2015-04-02 MED ORDER — PHENYLEPHRINE HCL 10 MG/ML IJ SOLN
INTRAMUSCULAR | Status: DC | PRN
  Administered 2015-04-02: 100 ug via INTRAVENOUS

## 2015-04-02 MED ORDER — DIPHENHYDRAMINE HCL 50 MG/ML IJ SOLN
INTRAMUSCULAR | Status: AC
  Administered 2015-04-02: 25 mg via INTRAVENOUS
  Filled 2015-04-02: qty 1

## 2015-04-02 MED ORDER — FAMOTIDINE 20 MG PO TABS
20.0000 mg | ORAL_TABLET | Freq: Once | ORAL | Status: AC
  Administered 2015-04-02: 20 mg via ORAL

## 2015-04-02 MED ORDER — HEPARIN SODIUM (PORCINE) 5000 UNIT/ML IJ SOLN
INTRAMUSCULAR | Status: AC
  Filled 2015-04-02: qty 1

## 2015-04-02 MED ORDER — CEFAZOLIN SODIUM-DEXTROSE 2-3 GM-% IV SOLR
2.0000 g | INTRAVENOUS | Status: AC
  Administered 2015-04-02: 2 g via INTRAVENOUS

## 2015-04-02 MED ORDER — FENTANYL CITRATE (PF) 100 MCG/2ML IJ SOLN
INTRAMUSCULAR | Status: DC | PRN
  Administered 2015-04-02: 50 ug via INTRAVENOUS

## 2015-04-02 MED ORDER — ONDANSETRON HCL 4 MG/2ML IJ SOLN
INTRAMUSCULAR | Status: DC | PRN
  Administered 2015-04-02: 4 mg via INTRAVENOUS

## 2015-04-02 SURGICAL SUPPLY — 29 items
ADHESIVE MASTISOL STRL (MISCELLANEOUS) IMPLANT
CANISTER SUCT 1200ML W/VALVE (MISCELLANEOUS) ×2 IMPLANT
CHLORAPREP W/TINT 26ML (MISCELLANEOUS) ×2 IMPLANT
DRAPE LAPAROTOMY 100X77 ABD (DRAPES) ×2 IMPLANT
ELECT REM PT RETURN 9FT ADLT (ELECTROSURGICAL) ×2
ELECTRODE REM PT RTRN 9FT ADLT (ELECTROSURGICAL) ×1 IMPLANT
GAUZE SPONGE 4X4 12PLY STRL (GAUZE/BANDAGES/DRESSINGS) ×2 IMPLANT
GAUZE SPONGE NON-WVN 2X2 STRL (MISCELLANEOUS) IMPLANT
GLOVE BIO SURGEON STRL SZ8 (GLOVE) ×12 IMPLANT
GOWN STRL REUS W/ TWL LRG LVL3 (GOWN DISPOSABLE) ×2 IMPLANT
GOWN STRL REUS W/TWL LRG LVL3 (GOWN DISPOSABLE) ×2
LABEL OR SOLS (LABEL) ×2 IMPLANT
MESH VENTRALEX ST 1-7/10 CRC S (Mesh General) ×2 IMPLANT
NDL SAFETY 22GX1.5 (NEEDLE) ×2 IMPLANT
NS IRRIG 500ML POUR BTL (IV SOLUTION) ×2 IMPLANT
PACK BASIN MINOR ARMC (MISCELLANEOUS) ×2 IMPLANT
SPONGE LAP 18X18 5 PK (GAUZE/BANDAGES/DRESSINGS) ×2 IMPLANT
SPONGE VERSALON 2X2 STRL (MISCELLANEOUS)
STRIP CLOSURE SKIN 1/2X4 (GAUZE/BANDAGES/DRESSINGS) IMPLANT
SUT ETHIBOND 0 (SUTURE) ×2 IMPLANT
SUT ETHIBOND NAB CT1 #1 30IN (SUTURE) ×2 IMPLANT
SUT MNCRL 4-0 (SUTURE) ×1
SUT MNCRL 4-0 27XMFL (SUTURE) ×1
SUT PROLENE 0 CT 1 30 (SUTURE) ×8 IMPLANT
SUT PROLENE 1 CT 1 30 (SUTURE) IMPLANT
SUT VIC AB 3-0 SH 27 (SUTURE) ×1
SUT VIC AB 3-0 SH 27X BRD (SUTURE) ×1 IMPLANT
SUTURE MNCRL 4-0 27XMF (SUTURE) ×1 IMPLANT
SYRINGE 10CC LL (SYRINGE) ×2 IMPLANT

## 2015-04-02 NOTE — Progress Notes (Signed)
Preoperative Review   Patient is met in the preoperative holding area. The history is reviewed in the chart and with the patient. I personally reviewed the options and rationale as well as the risks of this procedure that have been previously discussed with the patient. All questions asked by the patient and/or family were answered to their satisfaction.  Patient agrees to proceed with this procedure at this time.  Florene Glen M.D. FACS

## 2015-04-02 NOTE — Op Note (Signed)
Abdominal Hernia Repair  Pre-operative Diagnosis: Umbilical hernia  Post-operative Diagnosis: Same  Surgeon: Jerrol Banana. Burt Knack, MD FACS  Anesthesia: Gen. with endotracheal tube  Assistant: PA student  Procedure: Umbilical hernia repair with mesh  Procedure Details  The patient was seen again in the Holding Room. The benefits, complications, treatment options, and expected outcomes were discussed with the patient. The risks of bleeding, infection, recurrence of symptoms, failure to resolve symptoms, bowel injury, mesh placement, mesh infection, any of which could require further surgery were reviewed with the patient. The likelihood of improving the patient's symptoms with return to their baseline status is good.  The patient and/or family concurred with the proposed plan, giving informed consent.  The patient was taken to Operating Room, identified as Troy Parrish and the procedure verified.  A Time Out was held and the above information confirmed.  Prior to the induction of general anesthesia, antibiotic prophylaxis was administered. VTE prophylaxis was in place. General endotracheal anesthesia was then administered and tolerated well. After the induction, the abdomen was prepped with Chloraprep and draped in the sterile fashion. The patient was positioned in the supine position.  A curvilinear incision was made in the infraumbilical area and dissection down to the fascia was performed and incarcerated umbilical hernia was reduced and the fascial edges were cleaned it was measured at approximately 2 cm. Therefore a 4.3 cm ventral X mesh was placed and held in with 0 Prolene suture. The fascia was closed over the top of the mesh. Then deep sutures of 3-0 Vicryl placed followed by 4-0 subcuticular Monocryl multiple layers with a 3-0 Vicryl are placed to isolate the mesh. Additional Marcaine was placed for a total of 30 cc. Steri-Strips and Mastisol and a sterile dressing was placed.  Findings: 2  cm umbilical hernia with weak tissue and incarceration    Estimated Blood Loss: Nil         Drains: None         Specimens: None          Complications: none               Govind Furey E. Burt Knack, MD, FACS

## 2015-04-02 NOTE — Anesthesia Procedure Notes (Addendum)
Procedure Name: Intubation Performed by: Rolla Plate Pre-anesthesia Checklist: Patient identified, Patient being monitored, Timeout performed, Emergency Drugs available and Suction available Patient Re-evaluated:Patient Re-evaluated prior to inductionOxygen Delivery Method: Circle system utilized Preoxygenation: Pre-oxygenation with 100% oxygen Intubation Type: IV induction and Cricoid Pressure applied Ventilation: Mask ventilation without difficulty and Oral airway inserted - appropriate to patient size Laryngoscope Size: 3, Miller, Mac, 4 and Glidescope Grade View: Grade III Tube type: Oral Tube size: 7.5 mm Number of attempts: 3 Airway Equipment and Method: Stylet and Video-laryngoscopy Placement Confirmation: ETT inserted through vocal cords under direct vision,  positive ETCO2 and breath sounds checked- equal and bilateral Secured at: 23 cm Tube secured with: Tape Dental Injury: Teeth and Oropharynx as per pre-operative assessment  Difficulty Due To: Difficulty was unanticipated, Difficult Airway- due to reduced neck mobility, Difficult Airway- due to immobile epiglottis, Difficult Airway- due to anterior larynx and Difficult Airway- due to dentition Future Recommendations: Recommend- induction with short-acting agent, and alternative techniques readily available Comments: DL miller 3 to MAC 4 with grade III-IV view, glidescope VL OETT placed under visualization

## 2015-04-02 NOTE — Transfer of Care (Signed)
Immediate Anesthesia Transfer of Care Note  Patient: Troy Parrish  Procedure(s) Performed: Procedure(s): HERNIA REPAIR UMBILICAL ADULT (N/A) INSERTION OF MESH (N/A)  Patient Location: PACU  Anesthesia Type:General  Level of Consciousness: awake  Airway & Oxygen Therapy: Patient Spontanous Breathing and Patient connected to face mask oxygen  Post-op Assessment: Report given to RN  Post vital signs: Reviewed  Last Vitals:  Filed Vitals:   04/02/15 0614 04/02/15 0818  BP: 97/72 100/68  Pulse: 80 89  Temp: 35.9 C 36.4 C  Resp: 16 20    Complications: No apparent anesthesia complications

## 2015-04-02 NOTE — Discharge Instructions (Signed)
Remove dressing in 24 hours. May shower in 24 hours. Leave paper strips in place. Resume all home medications. Follow-up with Dr. Burt Knack in 10 days.  Open Hernia Repair, Care After Refer to this sheet in the next few weeks. These instructions provide you with information on caring for yourself after your procedure. Your health care provider may also give you more specific instructions. Your treatment has been planned according to current medical practices, but problems sometimes occur. Call your health care provider if you have any problems or questions after your procedure. WHAT TO EXPECT AFTER THE PROCEDURE After your procedure, it is typical to have the following:  Pain in your abdomen, especially along your incision. You will be given pain medicines to control the pain. (OXYCODONE)  Constipation. You may pick up an over the counter stool softener to help prevent this. HOME CARE INSTRUCTIONS  Only take over-the-counter or prescription medicines as directed by your health care provider.  Keep the incision area dry and clean. You may wash the incision area gently with soap and water 24 hours after surgery. Gently blot or dab the incision area dry. Do not take baths, use swimming pools, or use hot tubs for 10 days or until your health care provider approves.  Change bandages (dressings) as directed by your health care provider.  Continue your normal diet as directed by your health care provider. Eat plenty of fruits and vegetables to help prevent constipation.  Drink enough fluids to keep your urine clear or pale yellow. This also helps prevent constipation.  Do not drive until your health care provider says it is okay.  Do not lift anything heavier than 10 lb (4.5 kg) or play contact sports for 4 weeks or until your health care provider approves.  Follow up with your health care provider as directed.  SEEK MEDICAL CARE IF:  You have increased bleeding coming from the incision  site.  You have blood in your stool.  You have increasing pain in the incision area.  You see redness or swelling in the incision area.  You have fluid (pus) coming from the incision.  You have a fever.  You notice a bad smell coming from the incision area or dressing. SEEK IMMEDIATE MEDICAL CARE IF:  You develop a rash.  You have chest pain or shortness of breath.  You feel lightheaded or feel faint.   This information is not intended to replace advice given to you by your health care provider. Make sure you discuss any questions you have with your health care provider.   Document Released: 08/14/2004 Document Revised: 02/15/2014 Document Reviewed: 09/06/2012 Elsevier Interactive Patient Education 2016 [place] Anesthesia, Adult, Care After Refer to this sheet in the next few weeks. These instructions provide you with information on caring for yourself after your procedure. Your health care provider may also give you more specific instructions. Your treatment has been planned according to current medical practices, but problems sometimes occur. Call your health care provider if you have any problems or questions after your procedure. WHAT TO EXPECT AFTER THE PROCEDURE After the procedure, it is typical to experience:  Sleepiness.  Nausea and vomiting. HOME CARE INSTRUCTIONS  For the first 24 hours after general anesthesia:  Have a responsible person with you.  Do not drive a car. If you are alone, do not take public transportation.  Do not drink alcohol.  Do not take medicine that has not been prescribed by your health care provider.  Do not sign important papers or make important decisions.  You may resume a normal diet and activities as directed by your health care provider.  Change bandages (dressings) as directed.  If you have questions or problems that seem related to general anesthesia, call the hospital and ask for the anesthetist or  anesthesiologist on call. SEEK MEDICAL CARE IF:  You have nausea and vomiting that continue the day after anesthesia.  You develop a rash. SEEK IMMEDIATE MEDICAL CARE IF:   You have difficulty breathing.  You have chest pain.  You have any allergic problems.   This information is not intended to replace advice given to you by your health care provider. Make sure you discuss any questions you have with your health care provider.   Document Released: 05/03/2000 Document Revised: 02/15/2014 Document Reviewed: 05/26/2011 Elsevier Interactive Patient Education [employer].

## 2015-04-02 NOTE — Progress Notes (Signed)
Minimal coughing in post-op. Clearing his throat often. VSS. A/O. No c/o pain.

## 2015-04-02 NOTE — Anesthesia Preprocedure Evaluation (Signed)
Anesthesia Evaluation  Patient identified by MRN, date of birth, ID band Patient awake    Reviewed: Allergy & Precautions, H&P , NPO status , Patient's Chart, lab work & pertinent test results, reviewed documented beta blocker date and time   History of Anesthesia Complications Negative for: history of anesthetic complications  Airway Mallampati: II  TM Distance: >3 FB Neck ROM: full    Dental no notable dental hx. (+) Chipped, Poor Dentition   Pulmonary neg shortness of breath, sleep apnea and Continuous Positive Airway Pressure Ventilation , COPD (mild),  COPD inhaler, Recent URI , former smoker,    Pulmonary exam normal breath sounds clear to auscultation       Cardiovascular Exercise Tolerance: Good hypertension, On Medications (-) angina+ CAD, + Past MI (2013), + Cardiac Stents (x8 stents; 08/2014) and +CHF  (-) CABG Normal cardiovascular exam(-) dysrhythmias + Valvular Problems/Murmurs  Rhythm:regular Rate:Normal  Cardiomyopathy, ischemic; EF~45%    Neuro/Psych  Headaches, Seizures - (2011),  negative psych ROS   GI/Hepatic negative GI ROS, Neg liver ROS, GERD  Controlled,  Endo/Other  diabetes (borderline)  Renal/GU negative Renal ROS  negative genitourinary   Musculoskeletal   Abdominal   Peds  Hematology negative hematology ROS (+)   Anesthesia Other Findings Past Medical History:   Essential hypertension                          07/07/2011    History of colon polyps                                      Hyperlipidemia with target LDL less than 70     07/07/2011    Recurrent upper respiratory infection (URI)                  CAD S/P percutaneous coronary angioplasty       07/07/2011*     Comment:S/P PCI to all 3 major vessels;a)  Ant STEMI               2001- BMS-LAD x2 -->(redo PCI 6/'15 -- pLAD               Xience DES 2.5 x 12- 2.75 mm, mLAD 2.25 x 12 -               2.7 mm), b) '06 UA --> Cx- OM DES; c)  2013 Inf               MI BMS mRCA --> d) 10/'15 PCI dRCA Promus P DES              4.0 x 16 (4.25 mm); PTCA of RPL2 (2.0 mm) &RPDA              (2.25 mm) 11/2013   ST elevation myocardial infarction (STEMI) of * 2001           Comment:History of -- 2 stents in early and distal mid               LAD; prior cardiologist was Dr. Remi Haggard in               [place] elevation myocardial infarction (STEMI) of * 07/07/2011      Comment:Occluded RCA 2.75X18 INTEGRITY; Echo 07/2013: EF              45-50%,  Inferior & Posterolateral HK.    Unstable angina ([place])                            2006; Ju*     Comment:Cx-OM - PCI 2.5 mm 13 mm Cypher DES               [name]) ; 07/2013: Severe ISR of both               prox & Distal LAD stentS --> 2 DES stents (1 at              prox edge of the proximal stent, 2nd covers the              entire distal stent as well as proximal and               distal edge stenose)    COPD (chronic obstructive pulmonary disease) (*              Former heavy tobacco smoker                                    Comment: quit in May 2015 after multiple attempts at               trying to quit before    Glucose intolerance (pre-diabetes)              June 2015      Comment: hemoglobin 123456 6.6   Metabolic syndrome                                             Comment:Pre-diabetes, hypertension and truncal obesity               as well as dyslipidemia   Upper respiratory infection                                  Heart murmur                                                 Pneumonia                                       "several *   Chronic bronchitis (HCC)                        "used to *   OSA on CPAP                                                  GERD (gastroesophageal reflux disease)                       History of stomach ulcers  Headache                                                       Comment:"Imdur related;  stopped taking it; headaches               went away" (11/15/2013)   Seizures ([place])                                  "several"      Comment:"last one was ~ 2011" (11/15/2013)   Shortness of breath dyspnea                                  Elevated WBC count                                           Emphysema of lung (HCC)                                      Reproductive/Obstetrics negative OB ROS                             Anesthesia Physical  Anesthesia Plan  ASA: III  Anesthesia Plan: General   Post-op Pain Management:    Induction:   Airway Management Planned:   Additional Equipment:   Intra-op Plan:   Post-operative Plan:   Informed Consent: I have reviewed the patients History and Physical, chart, labs and discussed the procedure including the risks, benefits and alternatives for the proposed anesthesia with the patient or authorized representative who has indicated his/her understanding and acceptance.   Dental Advisory Given  Plan Discussed with: Anesthesiologist, CRNA and Surgeon  Anesthesia Plan Comments:         Anesthesia Quick Evaluation

## 2015-04-02 NOTE — Anesthesia Postprocedure Evaluation (Signed)
Anesthesia Post Note  Patient: Troy Parrish  Procedure(s) Performed: Procedure(s) (LRB): HERNIA REPAIR UMBILICAL ADULT (N/A) INSERTION OF MESH (N/A)  Patient location during evaluation: PACU Anesthesia Type: General Level of consciousness: awake and alert Pain management: pain level controlled Vital Signs Assessment: post-procedure vital signs reviewed and stable Respiratory status: spontaneous breathing, nonlabored ventilation, respiratory function stable and patient connected to nasal cannula oxygen Cardiovascular status: blood pressure returned to baseline and stable Postop Assessment: no signs of nausea or vomiting Anesthetic complications: no    Last Vitals:  Filed Vitals:   04/02/15 1000 04/02/15 1016  BP: 96/69 97/71  Pulse: 72 65  Temp:    Resp: 18 17    Last Pain:  Filed Vitals:   04/02/15 1017  PainSc: 4                  Martha Clan

## 2015-04-04 ENCOUNTER — Inpatient Hospital Stay: Payer: 59

## 2015-04-04 ENCOUNTER — Inpatient Hospital Stay: Payer: 59 | Attending: Internal Medicine | Admitting: Internal Medicine

## 2015-04-04 ENCOUNTER — Encounter: Payer: Self-pay | Admitting: Internal Medicine

## 2015-04-04 VITALS — BP 132/94 | HR 63 | Temp 96.9°F | Resp 18 | Ht 71.0 in | Wt 186.5 lb

## 2015-04-04 DIAGNOSIS — Z7982 Long term (current) use of aspirin: Secondary | ICD-10-CM | POA: Diagnosis not present

## 2015-04-04 DIAGNOSIS — J449 Chronic obstructive pulmonary disease, unspecified: Secondary | ICD-10-CM | POA: Diagnosis not present

## 2015-04-04 DIAGNOSIS — K219 Gastro-esophageal reflux disease without esophagitis: Secondary | ICD-10-CM | POA: Insufficient documentation

## 2015-04-04 DIAGNOSIS — I252 Old myocardial infarction: Secondary | ICD-10-CM | POA: Insufficient documentation

## 2015-04-04 DIAGNOSIS — I2510 Atherosclerotic heart disease of native coronary artery without angina pectoris: Secondary | ICD-10-CM | POA: Insufficient documentation

## 2015-04-04 DIAGNOSIS — E785 Hyperlipidemia, unspecified: Secondary | ICD-10-CM | POA: Insufficient documentation

## 2015-04-04 DIAGNOSIS — Z955 Presence of coronary angioplasty implant and graft: Secondary | ICD-10-CM | POA: Insufficient documentation

## 2015-04-04 DIAGNOSIS — Z79899 Other long term (current) drug therapy: Secondary | ICD-10-CM | POA: Insufficient documentation

## 2015-04-04 DIAGNOSIS — D72829 Elevated white blood cell count, unspecified: Secondary | ICD-10-CM | POA: Insufficient documentation

## 2015-04-04 DIAGNOSIS — I10 Essential (primary) hypertension: Secondary | ICD-10-CM | POA: Diagnosis not present

## 2015-04-04 DIAGNOSIS — R7303 Prediabetes: Secondary | ICD-10-CM | POA: Diagnosis not present

## 2015-04-04 DIAGNOSIS — G4733 Obstructive sleep apnea (adult) (pediatric): Secondary | ICD-10-CM | POA: Insufficient documentation

## 2015-04-04 DIAGNOSIS — Z86010 Personal history of colonic polyps: Secondary | ICD-10-CM | POA: Insufficient documentation

## 2015-04-04 DIAGNOSIS — Z87891 Personal history of nicotine dependence: Secondary | ICD-10-CM | POA: Diagnosis not present

## 2015-04-04 LAB — COMPREHENSIVE METABOLIC PANEL
ALT: 29 U/L (ref 17–63)
AST: 20 U/L (ref 15–41)
Albumin: 3.9 g/dL (ref 3.5–5.0)
Alkaline Phosphatase: 98 U/L (ref 38–126)
Anion gap: 5 (ref 5–15)
BUN: 17 mg/dL (ref 6–20)
CO2: 26 mmol/L (ref 22–32)
Calcium: 8.6 mg/dL — ABNORMAL LOW (ref 8.9–10.3)
Chloride: 106 mmol/L (ref 101–111)
Creatinine, Ser: 0.87 mg/dL (ref 0.61–1.24)
GFR calc Af Amer: >60 mL/min (ref >60)
GFR calc non Af Amer: >60 mL/min (ref >60)
Glucose, Bld: 94 mg/dL (ref 65–99)
Potassium: 3.9 mmol/L (ref 3.5–5.1)
Sodium: 137 mmol/L (ref 135–145)
Total Bilirubin: 0.4 mg/dL (ref 0.3–1.2)
Total Protein: 6.9 g/dL (ref 6.5–8.1)

## 2015-04-04 LAB — CBC WITH DIFFERENTIAL/PLATELET
Basophils Absolute: 0.1 10*3/uL (ref 0–0.1)
Basophils Relative: 1 %
Eosinophils Absolute: 0.1 10*3/uL (ref 0–0.7)
Eosinophils Relative: 1 %
HCT: 47.7 % (ref 40.0–52.0)
Hemoglobin: 16.3 g/dL (ref 13.0–18.0)
Lymphocytes Relative: 32 %
Lymphs Abs: 4.6 10*3/uL — ABNORMAL HIGH (ref 1.0–3.6)
MCH: 30.2 pg (ref 26.0–34.0)
MCHC: 34.2 g/dL (ref 32.0–36.0)
MCV: 88.1 fL (ref 80.0–100.0)
Monocytes Absolute: 0.9 10*3/uL (ref 0.2–1.0)
Monocytes Relative: 6 %
Neutro Abs: 8.8 10*3/uL — ABNORMAL HIGH (ref 1.4–6.5)
Neutrophils Relative %: 60 %
Platelets: 320 10*3/uL (ref 150–440)
RBC: 5.42 MIL/uL (ref 4.40–5.90)
RDW: 14.5 % (ref 11.5–14.5)
WBC: 14.5 10*3/uL — ABNORMAL HIGH (ref 3.8–10.6)

## 2015-04-04 LAB — LACTATE DEHYDROGENASE: LDH: 106 U/L (ref 98–192)

## 2015-04-04 LAB — C-REACTIVE PROTEIN: CRP: <0.5 mg/dL (ref <1.0)

## 2015-04-04 NOTE — [place] Notes (Signed)
[place] NOTE  Patient Care Team: Jearld Fenton, NP as PCP - General (Internal Medicine)  CHIEF COMPLAINTS/PURPOSE OF CONSULTATION:   # CHRONIC LEUCOCYTOSIS-  HISTORY OF PRESENTING ILLNESS:  Troy Parrish 55 y.o.  male  Hispanic patient  Has been referred was for further evaluation of his long-standing leukocytosis.   Patient states approximately 10 years ago and was noted to have elevated white count-  However he never had a bone marrow biopsy.    Patient denies any frequent use of steroids.  Denies any  Recurrent infections/  fevers or weight loss.  Denies any night sweats.  Patient has chronic fatigue. Patient quit smoking approximately 2015.   Appetite is good. No nausea no vomiting abdominal pain.  Patient recently had an hernia surgery in his abdomen.   ROS: A complete 10 point review of system is done which is negative except mentioned above in history of present illness  MEDICAL HISTORY:  Past Medical History  Diagnosis Date  . Essential hypertension 07/07/2011  . History of colon polyps   . Hyperlipidemia with target LDL less than 70 07/07/2011  . Recurrent upper respiratory infection (URI)   . CAD S/P percutaneous coronary angioplasty 07/07/2011; 6 & 10 2015    S/P PCI to all 3 major vessels;a)  Ant STEMI 2001- BMS-LAD x2 -->(redo PCI 6/'15 -- pLAD Xience DES 2.5 x 12- 2.75 mm, mLAD 2.25 x 12 - 2.7 mm), b) '06 UA --> Cx- OM DES; c) 2013 Inf MI BMS mRCA --> d) 10/'15 PCI dRCA Promus P DES 4.0 x 16 (4.25 mm); PTCA of RPL2 (2.0 mm) &RPDA (2.25 mm) 11/2013  . ST elevation myocardial infarction (STEMI) of anterior wall ([place]) 2001    History of -- 2 stents in early and distal mid LAD; prior cardiologist was Dr. Remi Haggard in [place]  . ST elevation myocardial infarction (STEMI) of inferior wall ([place]) 07/07/2011    Occluded RCA 4.09W11 INTEGRITY; Echo 07/2013: EF 45-50%, Inferior & Posterolateral HK.   Marland Kitchen Unstable angina ([place])  2006; June 2015    Cx-OM  - PCI 2.5 mm 13 mm Cypher DES [name], [place]) ; 07/2013: Severe ISR of both prox & Distal LAD stentS --> 2 DES stents (1 at prox edge of the proximal stent, 2nd covers the entire distal stent as well as proximal and distal edge stenose)   . COPD (chronic obstructive pulmonary disease) ([place])   . Former heavy tobacco smoker      quit in May 2015 after multiple attempts at trying to quit before   . Glucose intolerance (pre-diabetes) June 2015     hemoglobin A1c 6.6  . Metabolic syndrome     Pre-diabetes, hypertension and truncal obesity as well as dyslipidemia  . Upper respiratory infection   . Heart murmur   . Pneumonia "several times"  . Chronic bronchitis ([place]) "used to get it q yr"  . OSA on CPAP   . GERD (gastroesophageal reflux disease)   . History of stomach ulcers   . Headache     "Imdur related; stopped taking it; headaches went away" (11/15/2013)  . Seizures ([place]) "several"    "last one was ~ 2011" (11/15/2013)  . Shortness of breath dyspnea   . Elevated WBC count   . Emphysema of lung ([place])   . Umbilical hernia     SURGICAL HISTORY: Past Surgical History  Procedure Laterality Date  . Transthoracic echocardiogram  07/10/2011    EF 50-55% ,LV graded 1  diastolic dysf.   . Nuclear exercise study  11/30/2011    EF 45% ,exercise 10 METS, infarct\scar w mild perinfarct ischemia -basal inferolat and mid inferolat region  . Met/cpet  11/09/2011    submax. effort 1.05 RER peak V02 was 51% ,chronotropic incomp.hrt rate lows 80s to 100  . Percutaneous coronary stent intervention (pci-s)   June 2015    Distal mid LAD: Xience Alpine DES 2.25 mm x 28 mm (overlapping proximal and distal edge of previous stent) - 2.7 mm; proximal LAD 2.5 mm x 12 mm Xience Alpine DES (2.8 mm);; RCA 60-70% stenosis planned staged procedure  . Transthoracic echocardiogram  07/31/2013    LVEF 45-50. Inferior posterior hypokinesis with mild dilation. Apparent normal diastolic pressures  . Right heart cath  June  2015    Normal pressures with severely reduced cardiac output and index. (3.25/1.55)  . Cardiac catheterization  07/07/2011     inferolat post LV infarct ST-elevation MI  . Left heart catheterization with coronary angiogram N/A 07/07/2011    Procedure: LEFT HEART CATHETERIZATION WITH CORONARY ANGIOGRAM;  Surgeon: Marykay Lex, MD;  Location: [facility] CATH LAB;  Service: Cardiovascular;  Laterality: N/A;  . Percutaneous coronary stent intervention (pci-s)  07/07/2011    Procedure: PERCUTANEOUS CORONARY STENT INTERVENTION (PCI-S);  Surgeon: Marykay Lex, MD;  Location: [facility] CATH LAB;  Service: Cardiovascular;;  . Left and right heart catheterization with coronary angiogram N/A 07/30/2013    Procedure: LEFT AND RIGHT HEART CATHETERIZATION WITH CORONARY ANGIOGRAM;  Surgeon: Marykay Lex, MD;  Location[facility] CATH LAB;  Service: Cardiovascular;  Laterality: N/A;  . Percutaneous coronary stent intervention (pci-s)  07/30/2013    Procedure: PERCUTANEOUS CORONARY STENT INTERVENTION (PCI-S);  Surgeon: Marykay Lex, MD;  Location: [facility] CATH LAB;  Service: Cardiovascular;;  . Left heart cath  08/03/2013    Procedure: LEFT HEART CATH;  Surgeon: Marykay Lex, MD;  Location: [facility] CATH LAB;  Service: Cardiovascular;;  . Percutaneous coronary stent intervention (pci-s) N/A 11/15/2013    Procedure: PERCUTANEOUS CORONARY STENT INTERVENTION (PCI-S);  Surgeon: Marykay Lex, MD;  Location: [facility] CATH LAB;  Service: Cardiovascular;  Laterality: N/A;  . Coronary angioplasty with stent placement  06/2011    occluded RCA ,Integrity Resolute DES 2.75 x 18 mm stent dilated 3.1 mm  . Coronary angioplasty with stent placement  2001    ANTERIOR mi with a  PCI to LAD   . Coronary angioplasty with stent placement  2006    [place] by Dr Elson Clan -lesion lft circ/OM1 with 2.5 x 54mm Cypher DES  . Coronary angioplasty with stent placement  11/15/2013    Promus Premier DES 4.0 mm x 16 mm (4.25 mm) dRCA, 2.0 mm Cutting PTCA of RPL2  Ostium & POBA of mRPDA  . Coronary angioplasty with stent placement  2016    x3 in 2016  . Colonoscopy with propofol N/A 03/28/2015    Procedure: COLONOSCOPY WITH PROPOFOL;  Surgeon: Midge Minium, MD;  Location: [facility] SURGERY CNTR;  Service: Endoscopy;  Laterality: N/A;  . Esophagogastroduodenoscopy (egd) with propofol N/A 03/28/2015    Procedure: ESOPHAGOGASTRODUODENOSCOPY (EGD) WITH PROPOFOL;  Surgeon: Midge Minium, MD;  Location: [facility] SURGERY CNTR;  Service: Endoscopy;  Laterality: N/A;  . Polypectomy  03/28/2015    Procedure: POLYPECTOMY;  Surgeon: Midge Minium, MD;  Location: [facility]ERY [facility];  Service: Endoscopy;;  . Umbilical hernia repair N/A 04/02/2015    Procedure: HERNIA REPAIR UMBILICAL ADULT;  Surgeon: Lattie Haw, MD;  Location:  [facility] ORS;  Service: General;  Laterality: N/A;  . Insertion of mesh N/A 04/02/2015    Procedure: INSERTION OF MESH;  Surgeon: Florene Glen, MD;  Location: [facility] ORS;  Service: General;  Laterality: N/A;    SOCIAL HISTORY: Social History   Social History  . Marital Status: Married    Spouse Name: N/A  . Number of Children: N/A  . Years of Education: N/A   Occupational History  . Not on file.   Social History Main Topics  . Smoking status: Former Smoker -- 0.50 packs/day for 40 years    Types: Cigarettes    Quit date: 06/19/2013  . Smokeless tobacco: Never Used     Comment: Quit with Bupropion '150mg'$   . Alcohol Use: 0.6 oz/week    1 Cans of beer per week     Comment: rare  . Drug Use: No  . Sexual Activity: Yes   Other Topics Concern  . Not on file   Social History Narrative   He is a married father of 62, grandfather of 9. He does not really get routine exercise.    He is down to 5 or 6 cigarettes a day. He is very seriously wanting to quit. This is a significant cutback for him, but he has pretty much been told he needs to stop by his wife, and so he fully intends to do so. He is willing to try the patches or whatever. He has a  social alcoholic beverage every now and then.     FAMILY HISTORY: Family History  Problem Relation Age of Onset  . Coronary artery disease Father   . Heart disease Father   . Stroke Father   . Hypertension Father   . Hyperlipidemia Mother   . Diabetes Mother   . Hyperlipidemia Maternal Grandmother   . Diabetes Maternal Grandmother   . Hyperlipidemia Maternal Grandfather   . Diabetes Maternal Grandfather   . Emphysema Maternal Grandfather   . Heart disease Paternal Grandmother   . Heart disease Paternal Grandfather   . Cancer Maternal Aunt     breast    ALLERGIES:  is allergic to niacin and related.  MEDICATIONS:  Current Outpatient Prescriptions  Medication Sig Dispense Refill  . albuterol (PROVENTIL HFA) 108 (90 BASE) MCG/ACT inhaler Inhale 1 puff into the lungs every 6 (six) hours as needed. 1 Inhaler 2  . aspirin EC 81 MG tablet Take 162 mg by mouth at bedtime.    Marland Kitchen azelastine (OPTIVAR) 0.05 % ophthalmic solution Place 1 drop into both eyes daily. Reported on 03/14/2015    . buPROPion (WELLBUTRIN XL) 150 MG 24 hr tablet Take 150 mg by mouth daily.    . clobetasol cream (TEMOVATE) 5.73 % APPLY 1 APPLICATION TOPICALLY 2 (TWO) TIMES DAILY. 30 g 2  . CRESTOR 20 MG tablet TAKE 1 TABLET (20 MG TOTAL) BY MOUTH DAILY AT 6 PM. 30 tablet 3  . EFFIENT 10 MG TABS tablet TAKE 1 TABLET (10 MG TOTAL) BY MOUTH DAILY. 30 tablet 0  . fluticasone (FLONASE) 50 MCG/ACT nasal spray Place 2 sprays into both nostrils daily.    . isosorbide mononitrate (IMDUR) 30 MG 24 hr tablet Take 1 tablet (30 mg total) by mouth daily. 30 tablet 6  . losartan (COZAAR) 25 MG tablet TAKE 1 TABLET (25 MG TOTAL) BY MOUTH DAILY. 30 tablet 2  . metoprolol succinate (TOPROL-XL) 100 MG 24 hr tablet TAKE 1 TABLET BY MOUTH DAILY. TAKE WITH OR IMMEDIATELY FOLLOWING A MEAL.  30 tablet 0  . niacin (NIASPAN) 500 MG CR tablet Take 500 mg by mouth at bedtime. Takes 30 minutes after taking aspirin.    . nitroGLYCERIN (NITROSTAT)  0.4 MG SL tablet Place 1 tablet (0.4 mg total) under the tongue every 5 (five) minutes as needed for chest pain. 25 tablet 3  . oxyCODONE-acetaminophen (ROXICET) 5-325 MG tablet Take 1 tablet by mouth every 4 (four) hours as needed for moderate pain. 30 tablet 0  . pantoprazole (PROTONIX) 40 MG tablet Take 1 tablet (40 mg total) by mouth daily. 30 tablet 5  . ranolazine (RANEXA) 500 MG 12 hr tablet Take 1 tablet (500 mg total) by mouth 2 (two) times daily. (Patient taking differently: Take 500 mg by mouth every evening. ) 60 tablet 6   No current facility-administered medications for this visit.      Marland Kitchen  PHYSICAL EXAMINATION: ECOG PERFORMANCE STATUS: 0 - Asymptomatic  Filed Vitals:   04/04/15 0948  BP: 132/94  Pulse: 63  Temp: 96.9 F (36.1 C)  Resp: 18   Filed Weights   04/04/15 0948  Weight: 186 lb 8.2 oz (84.6 kg)    GENERAL: Well-nourished well-developed; Alert, no distress and comfortable.   Accompanied by family.  EYES: no pallor or icterus OROPHARYNX: no thrush or ulceration; good dentition  NECK: supple, no masses felt LYMPH:  no palpable lymphadenopathy in the cervical, axillary or inguinal regions LUNGS: clear to auscultation and  No wheeze or crackles HEART/CVS: regular rate & rhythm and no murmurs; No lower extremity edema ABDOMEN: abdomen soft, non-tender and normal bowel sounds; bandaged [recent hernia surgery] Musculoskeletal:no cyanosis of digits and no clubbing  PSYCH: alert & oriented x 3 with fluent speech NEURO: no focal motor/sensory deficits SKIN:  no rashes or significant lesions  LABORATORY DATA:  I have reviewed the data as listed Lab Results  Component Value Date   WBC 13.5* 03/25/2015   HGB 17.6 03/25/2015   HCT 51.8 03/25/2015   MCV 87.7 03/25/2015   PLT 358 03/25/2015    Recent Labs  07/01/14 1017 03/04/15 1500 03/25/15 1002  NA 134* 140 139  K 4.4 4.2 4.0  CL 106 108 106  CO2 '22 23 28  '$ GLUCOSE 113* 92 93  BUN '15 9 13   '$ CREATININE 0.78 0.82 0.95  CALCIUM 9.2 9.1 9.2  GFRNONAA  --   --  >60  GFRAA  --   --  >60  PROT 6.9 7.0  --   ALBUMIN 3.9 4.3  --   AST 20 19  --   ALT 23 19  --   ALKPHOS 118* 126*  --   BILITOT 0.4 0.5  --     RADIOGRAPHIC STUDIES: I have personally reviewed the radiological images as listed and agreed with the findings in the report. Dg Chest 2 View  03/14/2015  CLINICAL DATA:  Hypertension.  COPD.  Coronaries disease. EXAM: CHEST  2 VIEW COMPARISON:  07/17/2014 FINDINGS: Tapering of the peripheral pulmonary vasculature favors emphysema. Coronary artery stents noted. Heart size within normal limits. Mediastinal contours normal. Similar appearance of thoracic spondylosis to prior exams. IMPRESSION: 1. Emphysema.  No acute findings. 2. Thoracic spondylosis. Electronically Signed   By: Van Clines M.D.   On: 03/14/2015 16:14    ASSESSMENT & PLAN:   #  Chronic leukocytosis predominant neutrophilia- patient is asymptomatic. Likely benign; less likely malignant causes. Recommend checking CBC LDH and CMP CRP BCR ABL;  Peripheral blood flow cytometry.  Troy Parrish 2  mutation.  #  Patient follow-up with me in approximately 2 weeks to review the results of the blood work.  Thank you Dr.Bailey for allowing me to participate in the care of your pleasant patient. Please do not hesitate to contact me with questions or concerns in the interim.     Cammie Sickle, MD 04/04/2015 10:19 AM

## 2015-04-04 NOTE — [place] Notes (Signed)
Pt states he had EGD this week, hasd hernia repair 2 days ago.  About 8-10 years ago he lived in [place] and had wbc inc. And he was worked up for cancer and all tests were neg.  He does not remember what tests were done but a lot of blood work. He has not had any infection in last couple of months that pt would have fever for or inc. Wbc. Eating good.

## 2015-04-10 LAB — BCR-ABL1 FISH
Cells Analyzed: 200
Cells Counted: 200

## 2015-04-10 LAB — JAK2 GENOTYPR

## 2015-04-14 ENCOUNTER — Ambulatory Visit: Payer: 59 | Admitting: Surgery

## 2015-04-15 ENCOUNTER — Encounter: Payer: Self-pay | Admitting: Surgery

## 2015-04-16 ENCOUNTER — Encounter: Payer: Self-pay | Admitting: Surgery

## 2015-04-16 ENCOUNTER — Ambulatory Visit (INDEPENDENT_AMBULATORY_CARE_PROVIDER_SITE_OTHER): Payer: 59 | Admitting: Surgery

## 2015-04-16 VITALS — BP 134/87 | HR 78 | Temp 98.3°F | Wt 185.0 lb

## 2015-04-16 DIAGNOSIS — K429 Umbilical hernia without obstruction or gangrene: Secondary | ICD-10-CM

## 2015-04-16 LAB — COMP PANEL: LEUKEMIA/LYMPHOMA

## 2015-04-16 NOTE — Patient Instructions (Signed)
Please call if you have any questions or concerns.  °

## 2015-04-16 NOTE — [place] Notes (Signed)
Patient is status post umbilical herniorrhaphy with mesh. He is having no problems and did not take a single dose of Percocet. He feels well at this time and is back to his normal activities.  Physical exam reveals no erythema no drainage healing wound   Patient doing very well recommend follow up on an as-needed basis reminded of no heavy lifting for another 2 weeks

## 2015-04-18 ENCOUNTER — Inpatient Hospital Stay: Payer: 59 | Attending: Internal Medicine | Admitting: Internal Medicine

## 2015-04-18 VITALS — BP 109/76 | HR 70 | Temp 96.2°F | Resp 18 | Wt 196.9 lb

## 2015-04-18 DIAGNOSIS — I2510 Atherosclerotic heart disease of native coronary artery without angina pectoris: Secondary | ICD-10-CM | POA: Insufficient documentation

## 2015-04-18 DIAGNOSIS — K219 Gastro-esophageal reflux disease without esophagitis: Secondary | ICD-10-CM | POA: Diagnosis not present

## 2015-04-18 DIAGNOSIS — Z8719 Personal history of other diseases of the digestive system: Secondary | ICD-10-CM | POA: Diagnosis not present

## 2015-04-18 DIAGNOSIS — J449 Chronic obstructive pulmonary disease, unspecified: Secondary | ICD-10-CM | POA: Insufficient documentation

## 2015-04-18 DIAGNOSIS — D72829 Elevated white blood cell count, unspecified: Secondary | ICD-10-CM

## 2015-04-18 DIAGNOSIS — Z79899 Other long term (current) drug therapy: Secondary | ICD-10-CM | POA: Diagnosis not present

## 2015-04-18 DIAGNOSIS — G4733 Obstructive sleep apnea (adult) (pediatric): Secondary | ICD-10-CM | POA: Insufficient documentation

## 2015-04-18 DIAGNOSIS — I10 Essential (primary) hypertension: Secondary | ICD-10-CM | POA: Diagnosis not present

## 2015-04-18 DIAGNOSIS — E785 Hyperlipidemia, unspecified: Secondary | ICD-10-CM | POA: Diagnosis not present

## 2015-04-18 DIAGNOSIS — I252 Old myocardial infarction: Secondary | ICD-10-CM | POA: Insufficient documentation

## 2015-04-18 DIAGNOSIS — Z7982 Long term (current) use of aspirin: Secondary | ICD-10-CM | POA: Insufficient documentation

## 2015-04-18 DIAGNOSIS — R7303 Prediabetes: Secondary | ICD-10-CM | POA: Diagnosis not present

## 2015-04-18 DIAGNOSIS — Z86010 Personal history of colonic polyps: Secondary | ICD-10-CM | POA: Diagnosis not present

## 2015-04-18 DIAGNOSIS — F17210 Nicotine dependence, cigarettes, uncomplicated: Secondary | ICD-10-CM | POA: Diagnosis not present

## 2015-04-18 NOTE — Progress Notes (Signed)
[facility] NOTE  Patient Care Team: Jearld Fenton, NP as PCP - General (Internal Medicine)  CHIEF COMPLAINTS/PURPOSE OF CONSULTATION:   # CHRONIC LEUCOCYTOSIS-14 mild leukocytosis mild neutrophilia/ lymphocytosis. Peripheral blood flow cytometry negative for any immature cells; BCR ABL negative. Barnabas Lister 2 negative.  HISTORY OF PRESENTING ILLNESS:  Troy Parrish 55 y.o.  male  Hispanic patient with long-standing history of chronic leukocytosis- is here to review the results of his blood work.  Denies any fevers. Denies any recent steroids. Appetite is normal. No complications from the hernia surgery noted.  ROS: A complete 10 point review of system is done which is negative except mentioned above in history of present illness  MEDICAL HISTORY:  Past Medical History  Diagnosis Date  . Essential hypertension 07/07/2011  . History of colon polyps   . Hyperlipidemia with target LDL less than 70 07/07/2011  . Recurrent upper respiratory infection (URI)   . CAD S/P percutaneous coronary angioplasty 07/07/2011; 6 & 10 2015    S/P PCI to all 3 major vessels;a)  Ant STEMI 2001- BMS-LAD x2 -->(redo PCI 6/'15 -- pLAD Xience DES 2.5 x 12- 2.75 mm, mLAD 2.25 x 12 - 2.7 mm), b) '06 UA --> Cx- OM DES; c) 2013 Inf MI BMS mRCA --> d) 10/'15 PCI dRCA Promus P DES 4.0 x 16 (4.25 mm); PTCA of RPL2 (2.0 mm) &RPDA (2.25 mm) 11/2013  . ST elevation myocardial infarction (STEMI) of anterior wall ([place]) 2001    History of -- 2 stents in early and distal mid LAD; prior cardiologist was Dr. Remi Haggard in [place]  . ST elevation myocardial infarction (STEMI) of inferior wall ([place]) 07/07/2011    Occluded RCA 1.61W96 INTEGRITY; Echo 07/2013: EF 45-50%, Inferior & Posterolateral HK.   Marland Kitchen Unstable angina ([place])  2006; June 2015    Cx-OM - PCI 2.5 mm 13 mm Cypher DES [name], [place]) ; 07/2013: Severe ISR of both prox & Distal LAD stentS --> 2 DES stents (1 at prox edge of the proximal stent, 2nd  covers the entire distal stent as well as proximal and distal edge stenose)   . COPD (chronic obstructive pulmonary disease) ([employer])   . Former heavy tobacco smoker      quit in May 2015 after multiple attempts at trying to quit before   . Glucose intolerance (pre-diabetes) June 2015     hemoglobin A1c 6.6  . Metabolic syndrome     Pre-diabetes, hypertension and truncal obesity as well as dyslipidemia  . Upper respiratory infection   . Heart murmur   . Pneumonia "several times"  . Chronic bronchitis ([place]) "used to get it q yr"  . OSA on CPAP   . GERD (gastroesophageal reflux disease)   . History of stomach ulcers   . Headache     "Imdur related; stopped taking it; headaches went away" (11/15/2013)  . Seizures ([place]) "several"    "last one was ~ 2011" (11/15/2013)  . Shortness of breath dyspnea   . Elevated WBC count   . Emphysema of lung ([place])   . Umbilical hernia     SURGICAL HISTORY: Past Surgical History  Procedure Laterality Date  . Transthoracic echocardiogram  07/10/2011    EF 50-55% ,LV graded 1 diastolic dysf.   . Nuclear exercise study  11/30/2011    EF 45% ,exercise 10 METS, infarct\scar w mild perinfarct ischemia -basal inferolat and mid inferolat region  . Met/cpet  11/09/2011    submax. effort 1.05 RER  peak V02 was 51% ,chronotropic incomp.hrt rate lows 80s to 100  . Percutaneous coronary stent intervention (pci-s)   June 2015    Distal mid LAD: Xience Alpine DES 2.25 mm x 28 mm (overlapping proximal and distal edge of previous stent) - 2.7 mm; proximal LAD 2.5 mm x 12 mm Xience Alpine DES (2.8 mm);; RCA 60-70% stenosis planned staged procedure  . Transthoracic echocardiogram  07/31/2013    LVEF 45-50. Inferior posterior hypokinesis with mild dilation. Apparent normal diastolic pressures  . Right heart cath  June 2015    Normal pressures with severely reduced cardiac output and index. (3.25/1.55)  . Cardiac catheterization  07/07/2011     inferolat post LV infarct  ST-elevation MI  . Left heart catheterization with coronary angiogram N/A 07/07/2011    Procedure: LEFT HEART CATHETERIZATION WITH CORONARY ANGIOGRAM;  Surgeon: Leonie Man, MD;  Location: [facility] CATH LAB;  Service: Cardiovascular;  Laterality: N/A;  . Percutaneous coronary stent intervention (pci-s)  07/07/2011    Procedure: PERCUTANEOUS CORONARY STENT INTERVENTION (PCI-S);  Surgeon: Leonie Man, MD;  Location: [facility] CATH LAB;  Service: Cardiovascular;;  . Left and right heart catheterization with coronary angiogram N/A 07/30/2013    Procedure: LEFT AND RIGHT HEART CATHETERIZATION WITH CORONARY ANGIOGRAM;  Surgeon: Leonie Man, MD;  Location: [facility] CATH LAB;  Service: Cardiovascular;  Laterality: N/A;  . Percutaneous coronary stent intervention (pci-s)  07/30/2013    Procedure: PERCUTANEOUS CORONARY STENT INTERVENTION (PCI-S);  Surgeon: Leonie Man, MD;  Location: [facility] CATH LAB;  Service: Cardiovascular;;  . Left heart cath  08/03/2013    Procedure: LEFT HEART CATH;  Surgeon: Leonie Man, MD;  Location: [facility] CATH LAB;  Service: Cardiovascular;;  . Percutaneous coronary stent intervention (pci-s) N/A 11/15/2013    Procedure: PERCUTANEOUS CORONARY STENT INTERVENTION (PCI-S);  Surgeon: Leonie Man, MD;  Location: [facility] CATH LAB;  Service: Cardiovascular;  Laterality: N/A;  . Coronary angioplasty with stent placement  06/2011    occluded RCA ,Integrity Resolute DES 2.75 x 18 mm stent dilated 3.1 mm  . Coronary angioplasty with stent placement  2001    ANTERIOR mi with a  PCI to LAD   . Coronary angioplasty with stent placement  2006    [place] by Dr Dorris Fetch -lesion lft circ/OM1 with 2.5 x 70m Cypher DES  . Coronary angioplasty with stent placement  11/15/2013    Promus Premier DES 4.0 mm x 16 mm (4.25 mm) dRCA, 2.0 mm Cutting PTCA of RPL2 Ostium & POBA of mRPDA  . Coronary angioplasty with stent placement  2016    x3 in 2016  . Colonoscopy with propofol N/A 03/28/2015    Procedure:  COLONOSCOPY WITH PROPOFOL;  Surgeon: DLucilla Lame MD;  [place]  Service: Endoscopy;  Laterality: N/A;  . Esophagogastroduodenoscopy (egd) with propofol N/A 03/28/2015    Procedure: ESOPHAGOGASTRODUODENOSCOPY (EGD) WITH PROPOFOL;  Surgeon: DLucilla Lame MD;  [place]  Service: Endoscopy;  Laterality: N/A;  . Polypectomy  03/28/2015    Procedure: POLYPECTOMY;  Surgeon: DLucilla Lame MD;  [place]  Service: Endoscopy;;  . Umbilical hernia repair N/A 04/02/2015    Procedure: HERNIA REPAIR UMBILICAL ADULT;  Surgeon: RFlorene Glen MD;  Location: [facility] ORS;  Service: General;  Laterality: N/A;  . Insertion of mesh N/A 04/02/2015    Procedure: INSERTION OF MESH;  Surgeon: RFlorene Glen MD;  Location: [facility] ORS;  Service: General;  Laterality: N/A;  SOCIAL HISTORY: Social History   Social History  . Marital Status: Married    Spouse Name: N/A  . Number of Children: N/A  . Years of Education: N/A   Occupational History  . Not on file.   Social History Main Topics  . Smoking status: Former Smoker -- 0.50 packs/day for 40 years    Types: Cigarettes    Quit date: 06/19/2013  . Smokeless tobacco: Never Used     Comment: Quit with Bupropion 121m  . Alcohol Use: 0.6 oz/week    1 Cans of beer per week     Comment: rare  . Drug Use: No  . Sexual Activity: Yes   Other Topics Concern  . Not on file   Social History Narrative   He is a married father of 862 grandfather of 810 He does not really get routine exercise.    He is down to 5 or 6 cigarettes a day. He is very seriously wanting to quit. This is a significant cutback for him, but he has pretty much been told he needs to stop by his wife, and so he fully intends to do so. He is willing to try the patches or whatever. He has a social alcoholic beverage every now and then.     FAMILY HISTORY: Family History  Problem Relation Age of Onset  . Coronary artery disease Father    . Heart disease Father   . Stroke Father   . Hypertension Father   . Hyperlipidemia Mother   . Diabetes Mother   . Hyperlipidemia Maternal Grandmother   . Diabetes Maternal Grandmother   . Hyperlipidemia Maternal Grandfather   . Diabetes Maternal Grandfather   . Emphysema Maternal Grandfather   . Heart disease Paternal Grandmother   . Heart disease Paternal Grandfather   . Cancer Maternal Aunt     breast    ALLERGIES:  is allergic to niacin and related.  MEDICATIONS:  Current Outpatient Prescriptions  Medication Sig Dispense Refill  . albuterol (PROVENTIL HFA) 108 (90 BASE) MCG/ACT inhaler Inhale 1 puff into the lungs every 6 (six) hours as needed. 1 Inhaler 2  . aspirin EC 81 MG tablet Take 162 mg by mouth at bedtime.    .Marland Kitchenazelastine (OPTIVAR) 0.05 % ophthalmic solution Place 1 drop into both eyes daily. Reported on 03/14/2015    . buPROPion (WELLBUTRIN XL) 150 MG 24 hr tablet Take 150 mg by mouth daily.    . clobetasol cream (TEMOVATE) 04.13% APPLY 1 APPLICATION TOPICALLY 2 (TWO) TIMES DAILY. 30 g 2  . CRESTOR 20 MG tablet TAKE 1 TABLET (20 MG TOTAL) BY MOUTH DAILY AT 6 PM. 30 tablet 3  . EFFIENT 10 MG TABS tablet TAKE 1 TABLET (10 MG TOTAL) BY MOUTH DAILY. 30 tablet 0  . fluticasone (FLONASE) 50 MCG/ACT nasal spray Place 2 sprays into both nostrils daily.    . isosorbide mononitrate (IMDUR) 30 MG 24 hr tablet Take 1 tablet (30 mg total) by mouth daily. 30 tablet 6  . losartan (COZAAR) 25 MG tablet TAKE 1 TABLET (25 MG TOTAL) BY MOUTH DAILY. 30 tablet 2  . metoprolol succinate (TOPROL-XL) 100 MG 24 hr tablet TAKE 1 TABLET BY MOUTH DAILY. TAKE WITH OR IMMEDIATELY FOLLOWING A MEAL. 30 tablet 0  . niacin (NIASPAN) 500 MG CR tablet Take 500 mg by mouth at bedtime. Takes 30 minutes after taking aspirin.    . nitroGLYCERIN (NITROSTAT) 0.4 MG SL tablet Place 1 tablet (0.4 mg total) under  the tongue every 5 (five) minutes as needed for chest pain. 25 tablet 3  . oxyCODONE-acetaminophen  (ROXICET) 5-325 MG tablet Take 1 tablet by mouth every 4 (four) hours as needed for moderate pain. 30 tablet 0  . pantoprazole (PROTONIX) 40 MG tablet Take 1 tablet (40 mg total) by mouth daily. 30 tablet 5  . ranolazine (RANEXA) 500 MG 12 hr tablet Take 1 tablet (500 mg total) by mouth 2 (two) times daily. (Patient taking differently: Take 500 mg by mouth every evening. ) 60 tablet 6   No current facility-administered medications for this visit.      Marland Kitchen  PHYSICAL EXAMINATION: ECOG PERFORMANCE STATUS: 0 - Asymptomatic  Filed Vitals:   04/18/15 0924  BP: 109/76  Pulse: 70  Temp: 96.2 F (35.7 C)  Resp: 18   Filed Weights   04/18/15 0924  Weight: 196 lb 13.9 oz (89.3 kg)    GENERAL: Well-nourished well-developed; Alert, no distress and comfortable.   Accompanied by family.    LABORATORY DATA:  I have reviewed the data as listed Lab Results  Component Value Date   WBC 14.5* 04/04/2015   HGB 16.3 04/04/2015   HCT 47.7 04/04/2015   MCV 88.1 04/04/2015   PLT 320 04/04/2015    Recent Labs  07/01/14 1017 03/04/15 1500 03/25/15 1002 04/04/15 1040  NA 134* 140 139 137  K 4.4 4.2 4.0 3.9  CL 106 108 106 106  CO2 _0 GLUCOSE 113* 92 93 94  BUN _1 CREATININE 0.78 0.82 0.95 0.87  CALCIUM 9.2 9.1 9.2 8.6*  GFRNONAA  --   --  >60 >60  GFRAA  --   --  >60 >60  PROT 6.9 7.0  --  6.9  ALBUMIN 3.9 4.3  --  3.9  AST 20 19  --  20  ALT 23 19  --  29  ALKPHOS 118* 126*  --  98  BILITOT 0.4 0.5  --  0.4    ASSESSMENT & PLAN:   #  Chronic leukocytosis predominant neutrophilia for more than 10 years- patient is asymptomatic. Likely benign- repeat CBC shows white count of 14 mild leukocytosis mild lymphocytosis. Peripheral blood flow cytometry negative for any immature cells; BCR ABL negative. Barnabas Lister 2 negative. CMP within normal limits.  # Discussed with the patient- a bone marrow biopsy would not be very helpful as his counts have been pretty steady for many  years now. I would recommend follow up with PCP for now. We'll be happy to reevaluate the patient with a bone marrow biopsy if the patient's white count starts going up.   # 15 minutes face-to-face with the patient discussing the above plan of care; more than 50% of time spent on natural history; counseling and coordination.     Cammie Sickle, MD 04/18/2015 9:33 AM

## 2015-04-22 ENCOUNTER — Telehealth: Payer: Self-pay | Admitting: Internal Medicine

## 2015-04-22 ENCOUNTER — Encounter: Payer: Self-pay | Admitting: Family Medicine

## 2015-04-22 ENCOUNTER — Ambulatory Visit (INDEPENDENT_AMBULATORY_CARE_PROVIDER_SITE_OTHER): Payer: 59 | Admitting: Family Medicine

## 2015-04-22 VITALS — BP 114/72 | HR 96 | Temp 98.0°F | Wt 186.5 lb

## 2015-04-22 DIAGNOSIS — J449 Chronic obstructive pulmonary disease, unspecified: Secondary | ICD-10-CM | POA: Diagnosis not present

## 2015-04-22 MED ORDER — ALBUTEROL SULFATE HFA 108 (90 BASE) MCG/ACT IN AERS: 1.0000 | INHALATION_SPRAY | Freq: Four times a day (QID) | RESPIRATORY_TRACT | Status: DC | PRN

## 2015-04-22 NOTE — Progress Notes (Signed)
SUBJECTIVE:  Troy Parrish is a 55 y.o. male pt of Nicki Reaper, new to me, who complains of coryza, sore throat and dry cough since this morning.  No fever.  Daughter tested positive for flu yesterday.  He has a significant h/o COPD and CAD.  Does not use any daily inhalers.  Has proair rescue inhaler but has not used it yet today.   Patient is a former smoker.  H/o Leucocytosis followed by [facility].  Current Outpatient Prescriptions on File Prior to Visit  Medication Sig Dispense Refill  . albuterol (PROVENTIL HFA) 108 (90 BASE) MCG/ACT inhaler Inhale 1 puff into the lungs every 6 (six) hours as needed. 1 Inhaler 2  . aspirin EC 81 MG tablet Take 162 mg by mouth at bedtime.    Marland Kitchen azelastine (OPTIVAR) 0.05 % ophthalmic solution Place 1 drop into both eyes daily. Reported on 03/14/2015    . buPROPion (WELLBUTRIN XL) 150 MG 24 hr tablet Take 150 mg by mouth daily.    . clobetasol cream (TEMOVATE) 0.05 % APPLY 1 APPLICATION TOPICALLY 2 (TWO) TIMES DAILY. 30 g 2  . CRESTOR 20 MG tablet TAKE 1 TABLET (20 MG TOTAL) BY MOUTH DAILY AT 6 PM. 30 tablet 3  . EFFIENT 10 MG TABS tablet TAKE 1 TABLET (10 MG TOTAL) BY MOUTH DAILY. 30 tablet 0  . fluticasone (FLONASE) 50 MCG/ACT nasal spray Place 2 sprays into both nostrils daily.    . isosorbide mononitrate (IMDUR) 30 MG 24 hr tablet Take 1 tablet (30 mg total) by mouth daily. 30 tablet 6  . losartan (COZAAR) 25 MG tablet TAKE 1 TABLET (25 MG TOTAL) BY MOUTH DAILY. 30 tablet 2  . metoprolol succinate (TOPROL-XL) 100 MG 24 hr tablet TAKE 1 TABLET BY MOUTH DAILY. TAKE WITH OR IMMEDIATELY FOLLOWING A MEAL. 30 tablet 0  . niacin (NIASPAN) 500 MG CR tablet Take 500 mg by mouth at bedtime. Takes 30 minutes after taking aspirin.    . nitroGLYCERIN (NITROSTAT) 0.4 MG SL tablet Place 1 tablet (0.4 mg total) under the tongue every 5 (five) minutes as needed for chest pain. 25 tablet 3  . oxyCODONE-acetaminophen (ROXICET) 5-325 MG tablet Take 1 tablet by mouth every 4  (four) hours as needed for moderate pain. 30 tablet 0  . pantoprazole (PROTONIX) 40 MG tablet Take 1 tablet (40 mg total) by mouth daily. 30 tablet 5  . ranolazine (RANEXA) 500 MG 12 hr tablet Take 1 tablet (500 mg total) by mouth 2 (two) times daily. (Patient taking differently: Take 500 mg by mouth every evening. ) 60 tablet 6   No current facility-administered medications on file prior to visit.    Allergies  Allergen Reactions  . Niacin And Related Hives and Itching    Past Medical History  Diagnosis Date  . Essential hypertension 07/07/2011  . History of colon polyps   . Hyperlipidemia with target LDL less than 70 07/07/2011  . Recurrent upper respiratory infection (URI)   . CAD S/P percutaneous coronary angioplasty 07/07/2011; 6 & 10 2015    S/P PCI to all 3 major vessels;a)  Ant STEMI 2001- BMS-LAD x2 -->(redo PCI 6/'15 -- pLAD Xience DES 2.5 x 12- 2.75 mm, mLAD 2.25 x 12 - 2.7 mm), b) '06 UA --> Cx- OM DES; c) 2013 Inf MI BMS mRCA --> d) 10/'15 PCI dRCA Promus P DES 4.0 x 16 (4.25 mm); PTCA of RPL2 (2.0 mm) &RPDA (2.25 mm) 11/2013  . ST elevation myocardial infarction (STEMI) of  anterior wall ([place]) 2001    History of -- 2 stents in early and distal mid LAD; prior cardiologist was Dr. Remi Haggard in [place]  . ST elevation myocardial infarction (STEMI) of inferior wall ([place]) 07/07/2011    Occluded RCA 7.62U63 INTEGRITY; Echo 07/2013: EF 45-50%, Inferior & Posterolateral HK.   Marland Kitchen Unstable angina ([place])  2006; June 2015    Cx-OM - PCI 2.5 mm 13 mm Cypher DES [name], [place]) ; 07/2013: Severe ISR of both prox & Distal LAD stentS --> 2 DES stents (1 at prox edge of the proximal stent, 2nd covers the entire distal stent as well as proximal and distal edge stenose)   . COPD (chronic obstructive pulmonary disease) ([place])   . Former heavy tobacco smoker      quit in May 2015 after multiple attempts at trying to quit before   . Glucose intolerance (pre-diabetes) June 2015     hemoglobin A1c  6.6  . Metabolic syndrome     Pre-diabetes, hypertension and truncal obesity as well as dyslipidemia  . Upper respiratory infection   . Heart murmur   . Pneumonia "several times"  . Chronic bronchitis ([place]) "used to get it q yr"  . OSA on CPAP   . GERD (gastroesophageal reflux disease)   . History of stomach ulcers   . Headache     "Imdur related; stopped taking it; headaches went away" (11/15/2013)  . Seizures ([place]) "several"    "last one was ~ 2011" (11/15/2013)  . Shortness of breath dyspnea   . Elevated WBC count   . Emphysema of lung ([place])   . Umbilical hernia     Past Surgical History  Procedure Laterality Date  . Transthoracic echocardiogram  07/10/2011    EF 50-55% ,LV graded 1 diastolic dysf.   . Nuclear exercise study  11/30/2011    EF 45% ,exercise 10 METS, infarct\scar w mild perinfarct ischemia -basal inferolat and mid inferolat region  . Met/cpet  11/09/2011    submax. effort 1.05 RER peak V02 was 51% ,chronotropic incomp.hrt rate lows 80s to 100  . Percutaneous coronary stent intervention (pci-s)   June 2015    Distal mid LAD: Xience Alpine DES 2.25 mm x 28 mm (overlapping proximal and distal edge of previous stent) - 2.7 mm; proximal LAD 2.5 mm x 12 mm Xience Alpine DES (2.8 mm);; RCA 60-70% stenosis planned staged procedure  . Transthoracic echocardiogram  07/31/2013    LVEF 45-50. Inferior posterior hypokinesis with mild dilation. Apparent normal diastolic pressures  . Right heart cath  June 2015    Normal pressures with severely reduced cardiac output and index. (3.25/1.55)  . Cardiac catheterization  07/07/2011     inferolat post LV infarct ST-elevation MI  . Left heart catheterization with coronary angiogram N/A 07/07/2011    Procedure: LEFT HEART CATHETERIZATION WITH CORONARY ANGIOGRAM;  Surgeon: Leonie Man, MD;  Location: [facility] CATH LAB;  Service: Cardiovascular;  Laterality: N/A;  . Percutaneous coronary stent intervention (pci-s)  07/07/2011    Procedure:  PERCUTANEOUS CORONARY STENT INTERVENTION (PCI-S);  Surgeon: Leonie Man, MD;  Location: [facility] CATH LAB;  Service: Cardiovascular;;  . Left and right heart catheterization with coronary angiogram N/A 07/30/2013    Procedure: LEFT AND RIGHT HEART CATHETERIZATION WITH CORONARY ANGIOGRAM;  Surgeon: Leonie Man, MD;  Location: [facility] CATH LAB;  Service: Cardiovascular;  Laterality: N/A;  . Percutaneous coronary stent intervention (pci-s)  07/30/2013    Procedure: PERCUTANEOUS CORONARY STENT INTERVENTION (PCI-S);  Surgeon: Leonie Man, MD;  Location: [facility] CATH LAB;  Service: Cardiovascular;;  . Left heart cath  08/03/2013    Procedure: LEFT HEART CATH;  Surgeon: Leonie Man, MD;  Location: [facility] CATH LAB;  Service: Cardiovascular;;  . Percutaneous coronary stent intervention (pci-s) N/A 11/15/2013    Procedure: PERCUTANEOUS CORONARY STENT INTERVENTION (PCI-S);  Surgeon: Leonie Man, MD;  Location: [facility] CATH LAB;  Service: Cardiovascular;  Laterality: N/A;  . Coronary angioplasty with stent placement  06/2011    occluded RCA ,Integrity Resolute DES 2.75 x 18 mm stent dilated 3.1 mm  . Coronary angioplasty with stent placement  2001    ANTERIOR mi with a  PCI to LAD   . Coronary angioplasty with stent placement  2006    [place] by Dr Dorris Fetch -lesion lft circ/OM1 with 2.5 x 66m Cypher DES  . Coronary angioplasty with stent placement  11/15/2013    Promus Premier DES 4.0 mm x 16 mm (4.25 mm) dRCA, 2.0 mm Cutting PTCA of RPL2 Ostium & POBA of mRPDA  . Coronary angioplasty with stent placement  2016    x3 in 2016  . Colonoscopy with propofol N/A 03/28/2015    Procedure: COLONOSCOPY WITH PROPOFOL;  Surgeon: DLucilla Lame MD;  [place]  Service: Endoscopy;  Laterality: N/A;  . Esophagogastroduodenoscopy (egd) with propofol N/A 03/28/2015    Procedure: ESOPHAGOGASTRODUODENOSCOPY (EGD) WITH PROPOFOL;  Surgeon: DLucilla Lame MD;  [place]  Service: Endoscopy;   Laterality: N/A;  . Polypectomy  03/28/2015    Procedure: POLYPECTOMY;  Surgeon: DLucilla Lame MD;  [place]  Service: Endoscopy;;  . Umbilical hernia repair N/A 04/02/2015    Procedure: HERNIA REPAIR UMBILICAL ADULT;  Surgeon: RFlorene Glen MD;  Location: [facility] ORS;  Service: General;  Laterality: N/A;  . Insertion of mesh N/A 04/02/2015    Procedure: INSERTION OF MESH;  Surgeon: RFlorene Glen MD;  Location: [facility] ORS;  Service: General;  Laterality: N/A;    Family History  Problem Relation Age of Onset  . Coronary artery disease Father   . Heart disease Father   . Stroke Father   . Hypertension Father   . Hyperlipidemia Mother   . Diabetes Mother   . Hyperlipidemia Maternal Grandmother   . Diabetes Maternal Grandmother   . Hyperlipidemia Maternal Grandfather   . Diabetes Maternal Grandfather   . Emphysema Maternal Grandfather   . Heart disease Paternal Grandmother   . Heart disease Paternal Grandfather   . Cancer Maternal Aunt     breast    Social History   Social History  . Marital Status: Married    Spouse Name: N/A  . Number of Children: N/A  . Years of Education: N/A   Occupational History  . Not on file.   Social History Main Topics  . Smoking status: Former Smoker -- 0.50 packs/day for 40 years    Types: Cigarettes    Quit date: 06/19/2013  . Smokeless tobacco: Never Used     Comment: Quit with Bupropion '150mg'$   . Alcohol Use: 0.6 oz/week    1 Cans of beer per week     Comment: rare  . Drug Use: No  . Sexual Activity: Yes   Other Topics Concern  . Not on file   Social History Narrative   He is a married father of 839 grandfather of 870 He does not really get routine exercise.    He is down to 5  or 6 cigarettes a day. He is very seriously wanting to quit. This is a significant cutback for him, but he has pretty much been told he needs to stop by his wife, and so he fully intends to do so. He is willing to try the patches or whatever.  He has a social alcoholic beverage every now and then.    The PMH, PSH, Social History, Family History, Medications, and allergies have been reviewed in [facility], and have been updated if relevant.   OBJECTIVE:  BP 114/72 mmHg  Pulse 96  Temp(Src) 98 F (36.7 C) (Oral)  Wt 186 lb 8 oz (84.596 kg)  SpO2 96%  He appears well, vital signs are as noted. Ears normal.  Throat and pharynx normal.  Neck supple. No adenopathy in the neck. Nose is congested. Sinuses non tender. The chest is clear, without wheezes or rales.  ASSESSMENT:  viral upper respiratory illness  PLAN: Rapid flu negative and he afebrile.   Symptomatic therapy suggested: push fluids, rest and return office visit prn if symptoms persist or worsen. Lack of antibiotic effectiveness discussed with him as he seems more concerned about COPD exacerbation.  Lung exam clear and cough is not productive. Call or return to clinic prn if these symptoms worsen or fail to improve as anticipated.

## 2015-04-22 NOTE — Telephone Encounter (Signed)
Patient Name: Troy Parrish  DOB: [birth year]    Initial Comment caller states he has cough and sore throat   Nurse Assessment  Nurse: Mallie Mussel, RN, Alveta Heimlich Date/Time Eilene Ghazi Time): 04/22/2015 8:41:29 AM  Confirm and document reason for call. If symptomatic, describe symptoms. You must click the next button to save text entered. ---Caller states that he has a cough and sore throat which began this morning. Denies fever. He denies headache, but he feels cold. His daughter tested positive for flu yesterday. He has COPD and has 8 stents. Denies difficulty breathing.  Has the patient traveled out of the country within the last 30 days? ---No  Does the patient have any new or worsening symptoms? ---Yes  Will a triage be completed? ---Yes  Related visit to physician within the last 2 weeks? ---No  Does the PT have any chronic conditions? (i.e. diabetes, asthma, etc.) ---Yes  List chronic conditions. ---Hypercholesterolemia, COPD, HTN, Borderline Diabetes  Is this a behavioral health or substance abuse call? ---No     Guidelines    Guideline Title Affirmed Question Affirmed Notes  Influenza Exposure [1] Influenza EXPOSURE (Close Contact) within last 72 hours (3 days) AND [2] exposed person is HIGH RISK (e.g., age > 54 years, pregnant, HIV+, chronic medical condition)    Final Disposition User   See Physician within [place], RN, Alveta Heimlich    Comments  Dr. Glori Bickers shows an appointment time available for 1015, but it is recorded as on hold. I called the backline and spoke with Terri who states that the appointment has been scheduled. There is one for Dr. Deborra Medina at 1145. I scheduled the appointment.   Referrals  REFERRED TO PCP OFFICE   Disagree/Comply: Comply

## 2015-04-22 NOTE — Telephone Encounter (Signed)
Pt has appt scheduled 04/22/15 at 11:45 with Dr Deborra Medina.

## 2015-04-22 NOTE — Progress Notes (Signed)
Pre visit review using our clinic review tool, if applicable. No additional management support is needed unless otherwise documented below in the visit note. 

## 2015-04-23 ENCOUNTER — Telehealth: Payer: Self-pay | Admitting: Internal Medicine

## 2015-04-23 ENCOUNTER — Other Ambulatory Visit: Payer: Self-pay | Admitting: Internal Medicine

## 2015-04-23 MED ORDER — OSELTAMIVIR PHOSPHATE 75 MG PO CAPS: 75.0000 mg | ORAL_CAPSULE | Freq: Two times a day (BID) | ORAL | Status: DC

## 2015-04-23 NOTE — Telephone Encounter (Signed)
Patient Name: Troy Parrish DOB: 04-Dec-1960 Initial Comment caller states he had been exposed to flu yesterday - his test came back negative- he has COPD . He woke up this am congested and has a sore throat - is also coughing and has a headache Nurse Assessment Nurse: Vallery Sa, RN, Tye [place] Date/Time (Eastern Time): 04/23/2015 10:23:22 AM Confirm and document reason for call. If symptomatic, describe symptoms. You must click the next button to save text entered. ---Caller states he was exposed to child with Flu several days ago. He was seen by the Md yesterday and his Flu test was negative. He has COPD and 8 cardiac stents. He developed a sore throat, cold/cough symptoms, headache and muscle aches yesterday that are worse today. No fever. No severe breathing difficulty. Alert and responsive. Has the patient traveled out of the country within the last 30 days? ---No Does the patient have any new or worsening symptoms? ---Yes Will a triage be completed? ---Yes Related visit to physician within the last 2 weeks? ---Yes Does the PT have any chronic conditions? (i.e. diabetes, asthma, etc.) ---Yes List chronic conditions. ---COPD, 8 cardiac stents, High Blood Pressure and Cholesterol Is this a behavioral health or substance abuse call? ---No Guidelines Guideline Title Affirmed Question Affirmed Notes Influenza - Seasonal Patient is HIGH RISK (e.g., age > 57 years, pregnant, HIV+, or chronic medical condition) Final Disposition User Call PCP Now Vallery Sa, RN, Angie Fava asks if the MD will call in an anti-viral medications for him since he is feeling worse today. Called the office backline and notified Reena of Enis's condition and request. Connected Reena and [place]. Referrals REFERRED TO PCP OFFICE Disagree/Comply: Comply

## 2015-04-23 NOTE — Telephone Encounter (Signed)
Pt would like a call back Best number [phone]

## 2015-04-23 NOTE — Telephone Encounter (Signed)
I spoke with pt; pt was seen 04/22/15; pt has taken ibuprofen; today more congestion in chest and persistent prod cough with clear phlegm,S/T worse than when seen,achy today, no fever today but chills last night.Pt is concerned about the worsening of symptoms and pt has COPD. Pt wondered if needed antiviral med or what does Avie Echevaria NP suggest.Pt request cb. [employer]

## 2015-04-23 NOTE — Telephone Encounter (Signed)
Pt is aware of

## 2015-04-23 NOTE — Telephone Encounter (Signed)
Tamiflu sent to pharmacy

## 2015-04-24 NOTE — Telephone Encounter (Signed)
[name]. He may try Delsym. Michela Pitcher he is feeling a little better

## 2015-04-24 NOTE — Telephone Encounter (Signed)
PLEASE NOTE: All timestamps contained within this report are represented as [place] Standard Time. CONFIDENTIALTY NOTICE: This fax transmission is intended only for the addressee. It contains information that is legally privileged, confidential or otherwise protected from use or disclosure. If you are not the intended recipient, you are strictly prohibited from reviewing, disclosing, copying using or disseminating any of this information or taking any action in reliance on or regarding this information. If you have received this fax in error, please notify us immediately by telephone so that we can arrange for its return to [place]. Phone: [phone], Toll-Free: [phone], Fax: [phone] Page: 1 of 1 Call Id: [id number] [place] Patient Name: Troy Parrish Gender: Male DOB: July 17, 1960 Age: 58 Y 11 M 9 D Return Phone Number: [phone] (Primary) Address: City/State/Zip: [place] [address] Client [facility] Night - Client Client Site [facility] Physician Arnette Norris Contact Type Call Who Is Calling Patient / Member / Family / Caregiver Call Type Triage / Clinical Relationship To Patient Self Return Phone Number [phone] (Primary) Chief Complaint Flu Symptom Reason for Call Symptomatic / Request for [place] states that he has a sore throat and congestion that has gotten progressively worse, has been taking Tamiflu, has a fever of 99.7. He has body aches. Translation No Nurse Assessment Nurse: Tawanna Solo, RN, Vaughan Basta Date/Time (Eastern Time): 04/23/2015 9:57:43 PM Confirm and document reason for call. If symptomatic, describe symptoms. You must click the next button to save text entered. ---Caller says he has sore throat and congestion. He saw the PCP yesterday and received tamiflu. Temp is  99.4-100.1 Caller says he feels worse today. Caller wants to know what else he could do? Caller says he really doesn't have a sore throat, it is more of a cough. Has the patient traveled out of the country within the last 30 days? ---Not Applicable Does the patient have any new or worsening symptoms? ---No Please document clinical information provided and list any resource used. ---Based on nurse knowledge, informed the patient tamiflu does not eradicate the symptoms it may make them less severe. Guidelines Guideline Title Affirmed Question Affirmed Notes Nurse Date/Time (Eastern Time) Disp. Time Eilene Ghazi Time) Disposition Final User 04/23/2015 10:07:23 PM Clinical Call Yes Tawanna Solo, RN, Vaughan Basta Comments User: Prescilla Sours, RN Date/Time Eilene Ghazi Time): 04/23/2015 10:07:09 PM Caller declined triage of symptoms. Nurse informed caller the symptoms are consistent with the flu.

## 2015-04-24 NOTE — Telephone Encounter (Signed)
He can take Delsym for cough. If worse, he may need to be reevaluated.

## 2015-04-29 NOTE — Telephone Encounter (Signed)
error 

## 2015-06-04 ENCOUNTER — Other Ambulatory Visit: Payer: Self-pay | Admitting: *Deleted

## 2015-06-04 MED ORDER — RANOLAZINE ER 500 MG PO TB12: 500.0000 mg | ORAL_TABLET | Freq: Two times a day (BID) | ORAL | Status: DC

## 2015-06-04 MED ORDER — NIACIN ER (ANTIHYPERLIPIDEMIC) 500 MG PO TBCR: 500.0000 mg | EXTENDED_RELEASE_TABLET | Freq: Every day | ORAL | Status: DC

## 2015-06-04 MED ORDER — ISOSORBIDE MONONITRATE ER 30 MG PO TB24: 30.0000 mg | ORAL_TABLET | Freq: Every day | ORAL | Status: DC

## 2015-06-04 MED ORDER — LOSARTAN POTASSIUM 25 MG PO TABS: ORAL_TABLET | ORAL | Status: DC

## 2015-06-04 MED ORDER — METOPROLOL SUCCINATE ER 100 MG PO TB24: ORAL_TABLET | ORAL | Status: DC

## 2015-06-04 MED ORDER — PRASUGREL HCL 10 MG PO TABS: ORAL_TABLET | ORAL | Status: DC

## 2015-06-04 MED ORDER — NITROGLYCERIN 0.4 MG SL SUBL: 0.4000 mg | SUBLINGUAL_TABLET | SUBLINGUAL | Status: DC | PRN

## 2015-06-04 MED ORDER — ROSUVASTATIN CALCIUM 20 MG PO TABS: ORAL_TABLET | ORAL | Status: DC

## 2015-06-16 ENCOUNTER — Encounter: Payer: Self-pay | Admitting: Cardiology

## 2015-06-16 ENCOUNTER — Ambulatory Visit (INDEPENDENT_AMBULATORY_CARE_PROVIDER_SITE_OTHER): Payer: 59 | Admitting: Cardiology

## 2015-06-16 VITALS — BP 120/80 | HR 92 | Ht 71.0 in | Wt 181.0 lb

## 2015-06-16 DIAGNOSIS — I5042 Chronic combined systolic (congestive) and diastolic (congestive) heart failure: Secondary | ICD-10-CM | POA: Diagnosis not present

## 2015-06-16 DIAGNOSIS — I255 Ischemic cardiomyopathy: Secondary | ICD-10-CM

## 2015-06-16 DIAGNOSIS — I2089 Other forms of angina pectoris: Secondary | ICD-10-CM

## 2015-06-16 DIAGNOSIS — I5189 Other ill-defined heart diseases: Secondary | ICD-10-CM

## 2015-06-16 DIAGNOSIS — I10 Essential (primary) hypertension: Secondary | ICD-10-CM | POA: Diagnosis not present

## 2015-06-16 DIAGNOSIS — I2111 ST elevation (STEMI) myocardial infarction involving right coronary artery: Secondary | ICD-10-CM

## 2015-06-16 DIAGNOSIS — I208 Other forms of angina pectoris: Secondary | ICD-10-CM

## 2015-06-16 DIAGNOSIS — I2510 Atherosclerotic heart disease of native coronary artery without angina pectoris: Secondary | ICD-10-CM | POA: Diagnosis not present

## 2015-06-16 DIAGNOSIS — Z9861 Coronary angioplasty status: Secondary | ICD-10-CM

## 2015-06-16 DIAGNOSIS — E785 Hyperlipidemia, unspecified: Secondary | ICD-10-CM

## 2015-06-16 MED ORDER — METOPROLOL SUCCINATE ER 50 MG PO TB24: 50.0000 mg | ORAL_TABLET | Freq: Every day | ORAL | Status: DC

## 2015-06-16 NOTE — Progress Notes (Signed)
PCP: Webb Silversmith, NP  Clinic Note: Chief Complaint  Patient presents with  . 3 months visit    some chest pain, some short ness of braethe ,no swelling  . Coronary Artery Disease    Status post MI 2 with PCI, chronic stable angina    HPI: Troy Parrish is a 55 y.o. male with a PMH below who presents today for Four-month follow-up of CAD-PCI with prior instillation MI. I last saw him in February 2016, but he saw Tarri Fuller since then. His most recent intervention was back in October of 2015 for persistent class III exertional dyspnea and chest pain. Perform PCI of the distal RCA with PTCA of both the posterolateral branches.  Sade Mehlhoff was last seen on 03/12/2015 by Tarri Fuller, PA-C for palpitations. Apparently that time he was off his Plavix for several months. Appeared to be euvolemic. He was somewhat tachycardic but had normal oxygenation and relating. Heart rate went from 106-122. He had a 2-D echo checked.  He states that the whole month period then was a whirlwind - lots of stress & had been out of meds for about ~ 1 month --> wife's insurance changing.  Recent Hospitalizations: None  Studies Reviewed: Past surgical history updated  2-D Echo March 26, 2015: EF 50-55%. No regional wall motion abnormality. GR 1 DD   Cardiac MRI - At [facility], August 2016 1. The left ventricle is normal in cavity size and wall thickness. Global systolic function is mildly reduced. The LV ejection fraction is quantified at 51%. There is moderate hypokinesis of the basal to mid inferior wall, with mild hypokinesis of the apical segment. There is mild hypokinesis of the basal inferoseptum and basal to mid inferolateral wall. Metal artifact from the proximal LAD stent is noted adjacent to the basal anterior wall.  2. The right ventricle is normal in cavity size, wall thickness, and systolic function.  3. Both atria are normal in size.  4. The aortic valve is  trileaflet, with no significant aortic valve stenosis or regurgitation. There is no significant valvular disease.   5. Delayed enhancement imaging is abnormal. There are two areas of hyper-enhancement: a. There is 25-50% hyperenhancement of the basal to apical inferior wall, with extension into the basal inferoseptum and basal to mid inferolateral wall, consistent with a subendocardial myocardial infarction in the RCA perfusion territory. There is lipomatous metaplasia in this infarct. There is significant viability (>50%) of the RCA territory.  b. There is a focal (<25%) subendocardial hyperenhancement of the apex, consistent with a subendocardial myocardial infarction at the distal LAD perfusion territory. There is complete viability of the rest of the LAD territory. The left circumflex arterial perfusion territory is completely viable.  6. Adenosine stress perfusion imaging demonstrates a perfusion defect at the site of the RCA infarct, with no evidence of peri-infarct ischemia. There is no evidence of inducible myocardial ischemia.    Cardiopulmonary ST @ [place]: September 2016  1) The data reflects a maximal exertion test and suggests the patient's mild functional impairment is due primarily to mild pulmonary and circulatory limitations. Also suggestion of physical deconditioning as a potential limitation to exercise capacity.  2) Pulmonary limitation as evidenced by ventilatory reserve exhausted with peak exercise. No significant decrease in post-exercise FEV1 measurement as compared to resting measurement to suggest exercise-induced bronchoconstriction as a cause of exertional dyspnea.  3) Mild circulatory limitation suggested by evidence of ventilation-perfusion mismatch with exercise.  4) Low-normal aerobic reserve, suggesting  physical deconditioning may be a potential limitation to exercise capacity.  This is the metabolic report for the Cardiopulmonary Exercise Test. The Stress  ECG report is available on a separate report.   Interval History: Troy Parrish says that he feels a lot better in the has a long time. He is starting to become more involved in his family life. He still gets a little frustrated because he gets short of breath when he's trying to the one thing that he she really contributes to the family which is cooking. But he makes most of that and is now looking for doing things with his grandchildren. He is going on trips with his family. He is overall deathly better than he had been. He had a spell over the winter when his wife hasn't had change drops and had insurance changing and then he didn't have his medicines for a while. He got a little trouble with appetite is now doing better. He is actually trying to start doing more walking with his wife. He is gradually building up his tolerance for doing exercise, but still has to go slow. The only time he really notes any tightness in his chest as if he is stressed really exerting himself. One of the times when he is stressed out about his cooking. Nothing similar to his MI related anginal.  In the interim to the last saw him, his daughter wanted him to have a second opinion in cardiology on insulin with the [facility] and had the studies that were noted above done and basically what they told him was that what we are doing here was appropriate and they had no other recommendations besides we are doing. He personally is happy with his care here and has specifically wanted to come back to the [facility] office because of the excellent care provided by the entire team.  He still have a little bit of chest discomfort if he really pushes it, but no significant anginal symptoms with rest or exertion. He is somewhat limited by exertional dyspnea but has learned to get to make the most of what he can do now. He is more active with less symptoms. He denies any PND, orthopnea or edema.  No palpitations, lightheadedness, dizziness,  weakness or syncope/near syncope. No TIA/amaurosis fugax symptoms. No melena, hematochezia, hematuria, or epstaxis. No claudication.  ROS: A comprehensive was performed. Review of Systems  Constitutional: Positive for weight loss. Negative for fever, chills and malaise/fatigue.  HENT: Negative for congestion and nosebleeds.   Respiratory: Positive for cough (Improves every day out from his stop smoking.), shortness of breath (Baseline shortness of breath. But has improved overall.) and wheezing (Not as much. Usually exacerbated by the pollen however.).   Gastrointestinal: Negative for heartburn (Certain foods.), blood in stool and melena.  Genitourinary: Negative for hematuria.  Musculoskeletal: Positive for back pain and joint pain. Negative for myalgias and falls.  Neurological: Negative for headaches.  Endo/Heme/Allergies: Bruises/bleeds easily.  Psychiatric/Behavioral: Negative for memory loss. The patient has insomnia (He does have insomnia, but is notably improved.).        Overall his psychological status is much improved. He is pretty much come to grips with his condition and is making most of it. He is happier now that he has been a long time. Looking forward to doing things with his wife and children. Also grandchildren  All other systems reviewed and are negative.    Past Medical History  Diagnosis Date  . Essential hypertension 07/07/2011  .  History of colon polyps   . Hyperlipidemia with target LDL less than 70 07/07/2011  . Recurrent upper respiratory infection (URI)   . CAD S/P percutaneous coronary angioplasty 07/07/2011; 6 & 10 2015    S/P PCI to all 3 major vessels;a)  Ant STEMI 2001- BMS-LAD x2 -->(redo PCI 6/'15 -- pLAD Xience DES 2.5 x 12- 2.75 mm, mLAD 2.25 x 12 - 2.7 mm), b) '06 UA --> Cx- OM DES; c) 2013 Inf MI BMS mRCA --> d) 10/'15 PCI dRCA Promus P DES 4.0 x 16 (4.25 mm); PTCA of RPL2 (2.0 mm) &RPDA (2.25 mm) 11/2013  . ST elevation myocardial infarction (STEMI)  of anterior wall ([place]) 2001    History of -- 2 stents in early and distal mid LAD; prior cardiologist was Dr. Remi Haggard in [place]  . ST elevation myocardial infarction (STEMI) of inferior wall ([place]) 07/07/2011    Occluded RCA 6.96E95 INTEGRITY; Echo 07/2013: EF 45-50%, Inferior & Posterolateral HK.   Marland Kitchen Unstable angina ([place])  2006; June 2015    Cx-OM - PCI 2.5 mm 13 mm Cypher DES [name], [place]) ; 07/2013: Severe ISR of both prox & Distal LAD stentS --> 2 DES stents (1 at prox edge of the proximal stent, 2nd covers the entire distal stent as well as proximal and distal edge stenose)   . COPD (chronic obstructive pulmonary disease) ([employer])   . Former heavy tobacco smoker      quit in May 2015 after multiple attempts at trying to quit before   . Glucose intolerance (pre-diabetes) June 2015     hemoglobin A1c 6.6  . Metabolic syndrome     Pre-diabetes, hypertension and truncal obesity as well as dyslipidemia  . Upper respiratory infection   . Heart murmur   . Pneumonia "several times"  . Chronic bronchitis ([place]) "used to get it q yr"  . OSA on CPAP   . GERD (gastroesophageal reflux disease)   . History of stomach ulcers   . Headache     "Imdur related; stopped taking it; headaches went away" (11/15/2013)  . Seizures ([place]) "several"    "last one was ~ 2011" (11/15/2013)  . Shortness of breath dyspnea   . Elevated WBC count   . Emphysema of lung ([place])   . Umbilical hernia     Past Surgical History  Procedure Laterality Date  . Nm myoview ([facility] hx)  11/30/2011    EF 45% ,exercise 10 METS, infarct\scar w mild perinfarct ischemia -basal inferolat and mid inferolat region  . Met/cpet  11/09/2011    submax. effort 1.05 RER peak V02 was 51% ,chronotropic incomp.hrt rate lows 80s to 100  . Transthoracic echocardiogram  07/31/2013; February 2017    a. LVEF 45-50. Inferior posterior hypokinesis with mild dilation. Apparent normal diastolic pressures;   . Right heart cath  June 2015     Normal pressures with severely reduced cardiac output and index. (3.25/1.55)  . Left heart catheterization with coronary angiogram N/A 07/07/2011    Procedure: LEFT HEART CATHETERIZATION WITH CORONARY ANGIOGRAM;  Surgeon: Leonie Man, MD;  Location[facility] CATH LAB;  Service: Cardiovascular;  inferolat post LV infarct ST-elevation MI  . Percutaneous coronary stent intervention (pci-s)  07/07/2011    Procedure: PERCUTANEOUS CORONARY STENT INTERVENTION (PCI-S);  Surgeon: Leonie Man, MD;  Location: [facility] CATH LAB;  Service: Cardiovascular;;occluded RCA ,Integrity Resolute DES 2.75 x 18 mm stent - post-dilated 3.1 mm  . Left and right heart catheterization with coronary angiogram N/A 07/30/2013  Procedure: LEFT AND RIGHT HEART CATHETERIZATION WITH CORONARY ANGIOGRAM;  Surgeon: Leonie Man, MD;  Location: [facility] CATH LAB;  Service: Cardiovascular;  2 separate P & mLAD lesions -> PCI, MOd-Severe dRCA disesase with diffuse RPL/PDA disease (FFR)  . Percutaneous coronary stent intervention (pci-s)  07/30/2013    Procedure: PERCUTANEOUS CORONARY STENT INTERVENTION (PCI-S);  Surgeon: Leonie Man, MD;  Location: [facility] CATH LAB;  Service: Cardiovascular;;Distal mid LAD: Xience Alpine DES 2.25 mm x 28 mm (overlapping proximal and distal edge of previous stent) - 2.7 mm; proximal LAD 2.5 mm x 12 mm Xience Alpine DES (2.8 mm);; RCA 60-70% stenosis planned staged procedure  . Left heart cath  08/03/2013    Procedure: LEFT HEART CATH;  Surgeon: Leonie Man, MD;  Location: [facility] CATH LAB;  Service: Cardiovascular;;FFR of RCA - non-flow-limiting  . Percutaneous coronary stent intervention (pci-s) N/A 11/15/2013    Procedure: PERCUTANEOUS CORONARY STENT INTERVENTION (PCI-S);  Surgeon: Leonie Man, MD;  Location: [facility] CATH LAB;  Service: Cardiovascular;  Patent Cx & LAD Stents; PCI -dRCA Promus Premier DES 4.0 mm x 16 mm (4.25 mm) dRCA, 2.0 mm Cutting PTCA of RPL2 [place] & POBA of mRPDA  . Coronary angioplasty with stent  placement  2001    ANTERIOR mi with a  PCI to LAD; [place], [place] - Dr. Dorris Fetch  . Coronary angioplasty with stent placement  2006    [place] by Dr Dorris Fetch -lesion lft circ/OM1 with 2.5 x 55m Cypher DES  . Colonoscopy with propofol N/A 03/28/2015    Procedure: COLONOSCOPY WITH PROPOFOL;  Surgeon: DLucilla Lame MD;  Location: [facility]  Service: Endoscopy;  Laterality: N/A;  . Esophagogastroduodenoscopy (egd) with propofol N/A 03/28/2015    Procedure: ESOPHAGOGASTRODUODENOSCOPY (EGD) WITH PROPOFOL;  Surgeon: DLucilla Lame MD;  [facility]  Service: Endoscopy;  Laterality: N/A;  . Polypectomy  03/28/2015    Procedure: POLYPECTOMY;  Surgeon: DLucilla Lame MD;  [place] [facility]  Service: Endoscopy;;  . Umbilical hernia repair N/A 04/02/2015    Procedure: HERNIA REPAIR UMBILICAL ADULT;  Surgeon: RFlorene Glen MD;  Location: [facility] ORS;  Service: General;  Laterality: N/A;  . Insertion of mesh N/A 04/02/2015    Procedure: INSERTION OF MESH;  Surgeon: RFlorene Glen MD;  Location: [facility] ORS;  Service: General;  Laterality: N/A;  . Cardiac mri [facility]  February 2017    EF 51%. Mod HK of basal-mid Inf wall, mild HK of basal inferoseptum & basal-mid inferolateral wall with hyper enhancement (c/w subendocard RCA MI with significant viability), focal hyper enhancement of apical wall c/w dLAD subendocard MI & complete LAD viability. . No aortic stenosis. Normal RV function. No Ischemia on Adenosine Stress.  . Met/cpet  September 2016    [facility]: Mild functional impairment due primarily to mild pulmonary and circulatory limitations. Also suggest physical deconditioning (seen with low-normal aerobic reserve). Mild VQ mismatch with exercise.  Ventilatory reserve exhausted with peak exercise demonstrated pulmonary limitation. No significant decrease in postexercise FEV1 compared to rest --> suggest no exercise induced bronchospasm   Prior to Admission medications   Medication  Sig Start Date End Date Taking? Authorizing Provider  albuterol (PROVENTIL HFA) 108 (90 Base) MCG/ACT inhaler Inhale 1 puff into the lungs every 6 (six) hours as needed. 04/22/15  Yes TLucille Passy MD  aspirin EC 81 MG tablet Take 162 mg by mouth at bedtime.   Yes Historical Provider, MD  azelastine (OPTIVAR) 0.05 % ophthalmic solution Place  1 drop into both eyes daily. Reported on 03/14/2015 03/06/15  Yes Historical Provider, MD  buPROPion (WELLBUTRIN XL) 150 MG 24 hr tablet Take 150 mg by mouth daily.   Yes Historical Provider, MD  clobetasol cream (TEMOVATE) 5.46 % APPLY 1 APPLICATION TOPICALLY 2 (TWO) TIMES DAILY. 12/10/14  Yes Jearld Fenton, NP  fluticasone (FLONASE) 50 MCG/ACT nasal spray Place 2 sprays into both nostrils daily. 03/06/15  Yes Historical Provider, MD  isosorbide mononitrate (IMDUR) 30 MG 24 hr tablet Take 1 tablet (30 mg total) by mouth daily. 06/04/15  Yes Leonie Man, MD  losartan (COZAAR) 25 MG tablet TAKE 1 TABLET (25 MG TOTAL) BY MOUTH DAILY. 06/04/15  Yes Leonie Man, MD  metoprolol succinate (TOPROL-XL) 100 MG 24 hr tablet TAKE 1 TABLET BY MOUTH DAILY. TAKE WITH OR IMMEDIATELY FOLLOWING A MEAL. 06/04/15  Yes Leonie Man, MD  niacin (NIASPAN) 500 MG CR tablet Take 1 tablet (500 mg total) by mouth at bedtime. Takes 30 minutes after taking aspirin. 06/04/15  Yes Leonie Man, MD  nitroGLYCERIN (NITROSTAT) 0.4 MG SL tablet Place 1 tablet (0.4 mg total) under the tongue every 5 (five) minutes as needed for chest pain. 06/04/15  Yes Leonie Man, MD  prasugrel (EFFIENT) 10 MG TABS tablet TAKE 1 TABLET (10 MG TOTAL) BY MOUTH DAILY. 06/04/15  Yes Leonie Man, MD  ranolazine (RANEXA) 500 MG 12 hr tablet Take 1 tablet (500 mg total) by mouth 2 (two) times daily. 06/04/15  Yes Leonie Man, MD  rosuvastatin (CRESTOR) 20 MG tablet TAKE 1 TABLET (20 MG TOTAL) BY MOUTH DAILY AT 6 PM. 06/04/15  Yes Leonie Man, MD    Allergies  Allergen Reactions  . Niacin And Related  Hives and Itching    Social History   Social History  . Marital Status: Married    Spouse Name: N/A  . Number of Children: N/A  . Years of Education: N/A   Social History Main Topics  . Smoking status: Former Smoker -- 0.50 packs/day for 40 years    Types: Cigarettes    Quit date: 06/19/2013  . Smokeless tobacco: Never Used     Comment: Quit with Bupropion '150mg'$   . Alcohol Use: 0.6 oz/week    1 Cans of beer per week     Comment: rare  . Drug Use: No  . Sexual Activity: Yes   Other Topics Concern  . None   Social History Narrative   He is a married father of 55, grandfather of 72. He does not really get routine exercise.    He is down to 5 or 6 cigarettes a day. He is very seriously wanting to quit. This is a significant cutback for him, but he has pretty much been told he needs to stop by his wife, and so he fully intends to do so. He is willing to try the patches or whatever. He has a social alcoholic beverage every now and then.    Family History  family history includes Cancer in his maternal aunt; Coronary artery disease in his father; Diabetes in his maternal grandfather, maternal grandmother, and mother; Emphysema in his maternal grandfather; Heart disease in his father, paternal grandfather, and paternal grandmother; Hyperlipidemia in his maternal grandfather, maternal grandmother, and mother; Hypertension in his father; Stroke in his father.   Wt Readings from Last 3 Encounters:  06/16/15 181 lb (82.101 kg)  04/22/15 186 lb 8 oz (84.596 kg)  04/18/15 196 lb  13.9 oz (89.3 kg)    PHYSICAL EXAM BP 120/80 mmHg  Pulse 92  Ht '5\' 11"'$  (1.803 m)  Wt 181 lb (82.101 kg)  BMI 25.26 kg/m2 General appearance: alert, cooperative, appears stated age, he no longer seems depressed. He actually has more color than usual today and in somewhat better spirits. Well-nourished and well-groomed  Neck: no adenopathy, no carotid bruit, no JVD and supple, symmetrical, trachea midline   Lungs: diminished breath sounds bilaterally, wheezes bilaterally and Otherwise the most part CPAP. Nonlabored. Increased AP diameter. Prolonged expiratory phase  Heart: RRR with ectopy, S1, S2 normal, no murmur, click, rub or gallop and normal apical impulse  Abdomen: soft, non-tender; bowel sounds normal; no masses, no organomegaly and Mildly protuberant  Extremities: extremities normal, atraumatic, no cyanosis or edema  Pulses: 2+ and symmetric  Neurologic: Mental status: Alert, oriented, thought content appropriate, affect: blunted and mood-congruent  Cranial nerves: normal will    Adult ECG Report  Rate: 92 ;  Rhythm: normal sinus rhythm; Normal axis, intervals & durations.  Narrative Interpretation: stable, normal EKG.     Other studies Reviewed: Additional studies/ records that were reviewed today include:  Recent Labs:   Lab Results  Component Value Date   CHOL 224* 03/04/2015   HDL 37.70* 03/04/2015   LDLCALC 62 07/26/2013   LDLDIRECT 138.0 03/04/2015   TRIG 293.0* 03/04/2015   CHOLHDL 6 03/04/2015     ASSESSMENT / PLAN: Problem List Items Addressed This Visit    STEMI, acute inf. wall with 100% RCA stenosis, DES 07/07/11 (prior Anterior STEMI in 2001) (Chronic)    He has had a total of 3 ST elevation MIs. When I met him was his third. He is finally now starting to get over the emotional aspect of that episode. He essentially had follow PCI, there is no longer have any significant anginal symptoms. He does have a significant defect noted on cardiac MRI echocardiogram obtained from his scar. Is also a little apical scar is well from the significant LAD lesions. Overall, however he does have a lot of viability in both distributions. No ischemia.      Relevant Medications   metoprolol succinate (TOPROL-XL) 50 MG 24 hr tablet   Other Relevant Orders   EKG 12-Lead   Stable angina (HCC) (Chronic)    Notably improved after his last PCI. Also with combination of Ranexa  and Imdur. Very minimal episodes and very well controlled. Continue current medications.      Relevant Medications   metoprolol succinate (TOPROL-XL) 50 MG 24 hr tablet   Other Relevant Orders   EKG 12-Lead   Hyperlipidemia with target LDL less than 70; by his report he is back on Crestor (Chronic)    Back on Crestor now. Labs are checked back in January. Notable improvement in the overall values, but total cholesterol was still a little bit too high. She will be due for a follow-up check and see him back.       Relevant Medications   metoprolol succinate (TOPROL-XL) 50 MG 24 hr tablet   Essential hypertension - Primary (Chronic)    Well-controlled today on current meds. No changes.      Relevant Medications   metoprolol succinate (TOPROL-XL) 50 MG 24 hr tablet   Other Relevant Orders   EKG 12-Lead   Chronotropic incompetence with left ventricular dysfunction --> improved with reduction of beta blocker (Chronic)    Overall this seems to be improved. I think though with her having some  fatigue, breakup to 50 mg twice a day his most just 100 mg once a day. See if this will help improve his energy level. With a resting heart rate of 92, I'm reluctant to actually reduce the overall dose however.      Relevant Medications   metoprolol succinate (TOPROL-XL) 50 MG 24 hr tablet   Other Relevant Orders   EKG 12-Lead   Chronic combined systolic and diastolic congestive heart failure, NYHA class 2 (HCC) (Chronic)   Relevant Medications   metoprolol succinate (TOPROL-XL) 50 MG 24 hr tablet   Other Relevant Orders   EKG 12-Lead   Cardiomyopathy, ischemic; EF~45% with hypokinesis of the inferior and inferolateral myocardium; CO 3.25 (Chronic)    Overall, EF is not that bad, he buys some diastolic dysfunction as well. From a heart failure standpoint he seems to be pretty stable with probably class II symptoms. He is on Toprol and Cozaar with relatively well-controlled blood pressure. He has no  furosemide requirement. No heart failure symptoms other than maybe lower energy than normal.      Relevant Medications   metoprolol succinate (TOPROL-XL) 50 MG 24 hr tablet   CAD S/P percutaneous coronary angioplasty (Chronic)    Elderly stable with no real significant anginal symptoms now. He is not had used nitroglycerin recently. He still has low but fatigue and excise intolerance, but seems to be tolerating his meds otherwise okay. He is now starting to get more active. He is on Effient, I explained to him that he could potentially stop his aspirin as he would like. He is happy taking it so continued. He is on a stable dose of losartan and metoprolol along with Imdur. We also have him on Ranexa which seems to have gotten his angina under control.      Relevant Medications   metoprolol succinate (TOPROL-XL) 50 MG 24 hr tablet   Other Relevant Orders   EKG 12-Lead      Current medicines are reviewed at length with the patient today. (+/- concerns) n/a The following changes have been made:   Stop metoprolol succ ( XL) 100 mg  Start metoprolol succinate (XL) 50 MG ONE TABLET DAILY   Your physician wants you to follow-up in [place].  Studies Ordered:   Orders Placed This Encounter  Procedures  . EKG 12-Lead      Leonie Man, M.D., M.S. Interventional Cardiologist   Pager # [phone] Phone # [phone] [place], [place] [address]

## 2015-06-16 NOTE — Patient Instructions (Signed)
Stop metoprolol succ ( XL) 100 mg  Start metoprolol succinate (XL) 50 MG ONE TABLET DAILY   Your physician wants you to follow-up in [place]. You will receive a reminder letter in the mail two months in advance. If you don't receive a letter, please call our office to schedule the follow-up appointment.'   If you need a refill on your cardiac medications before your next appointment, please call your pharmacy.

## 2015-06-17 ENCOUNTER — Encounter: Payer: Self-pay | Admitting: Cardiology

## 2015-06-17 NOTE — Assessment & Plan Note (Signed)
Notably improved after his last PCI. Also with combination of Ranexa and Imdur. Very minimal episodes and very well controlled. Continue current medications.

## 2015-06-17 NOTE — Assessment & Plan Note (Signed)
Elderly stable with no real significant anginal symptoms now. He is not had used nitroglycerin recently. He still has low but fatigue and excise intolerance, but seems to be tolerating his meds otherwise okay. He is now starting to get more active. He is on Effient, I explained to him that he could potentially stop his aspirin as he would like. He is happy taking it so continued. He is on a stable dose of losartan and metoprolol along with Imdur. We also have him on Ranexa which seems to have gotten his angina under control.

## 2015-06-17 NOTE — Assessment & Plan Note (Signed)
Overall this seems to be improved. I think though with her having some fatigue, breakup to 50 mg twice a day his most just 100 mg once a day. See if this will help improve his energy level. With a resting heart rate of 92, I'm reluctant to actually reduce the overall dose however.

## 2015-06-17 NOTE — Assessment & Plan Note (Signed)
Well-controlled today on current meds. No changes.

## 2015-06-17 NOTE — Assessment & Plan Note (Signed)
Overall, EF is not that bad, he buys some diastolic dysfunction as well. From a heart failure standpoint he seems to be pretty stable with probably class II symptoms. He is on Toprol and Cozaar with relatively well-controlled blood pressure. He has no furosemide requirement. No heart failure symptoms other than maybe lower energy than normal.

## 2015-06-17 NOTE — Assessment & Plan Note (Signed)
Back on Crestor now. Labs are checked back in January. Notable improvement in the overall values, but total cholesterol was still a little bit too high. She will be due for a follow-up check and see him back.

## 2015-06-17 NOTE — Assessment & Plan Note (Signed)
He has had a total of 3 ST elevation MIs. When I met him was his third. He is finally now starting to get over the emotional aspect of that episode. He essentially had follow PCI, there is no longer have any significant anginal symptoms. He does have a significant defect noted on cardiac MRI echocardiogram obtained from his scar. Is also a little apical scar is well from the significant LAD lesions. Overall, however he does have a lot of viability in both distributions. No ischemia.

## 2015-06-24 ENCOUNTER — Encounter: Payer: Self-pay | Admitting: Family

## 2015-06-24 ENCOUNTER — Ambulatory Visit (INDEPENDENT_AMBULATORY_CARE_PROVIDER_SITE_OTHER): Payer: 59 | Admitting: Family

## 2015-06-24 VITALS — BP 116/84 | HR 100 | Temp 98.9°F | Ht 71.0 in | Wt 181.4 lb

## 2015-06-24 DIAGNOSIS — R05 Cough: Secondary | ICD-10-CM

## 2015-06-24 DIAGNOSIS — J449 Chronic obstructive pulmonary disease, unspecified: Secondary | ICD-10-CM | POA: Diagnosis not present

## 2015-06-24 DIAGNOSIS — R059 Cough, unspecified: Secondary | ICD-10-CM

## 2015-06-24 MED ORDER — PREDNISONE 10 MG PO TABS
ORAL_TABLET | ORAL | Status: DC
Start: 2015-06-24 — End: 2015-08-29

## 2015-06-24 MED ORDER — BENZONATATE 100 MG PO CAPS: 100.0000 mg | ORAL_CAPSULE | Freq: Three times a day (TID) | ORAL | Status: DC | PRN

## 2015-06-24 MED ORDER — ALBUTEROL SULFATE HFA 108 (90 BASE) MCG/ACT IN AERS: 1.0000 | INHALATION_SPRAY | Freq: Four times a day (QID) | RESPIRATORY_TRACT | Status: DC | PRN

## 2015-06-24 NOTE — Patient Instructions (Addendum)
Prednisone taper.   Use albuterol every 6 hours for first 24 hours to get good medication into the lungs and loosen congestion; after, you may use as needed and eventually stop all together when cough resolves.  Increase intake of clear fluids. Congestion is best treated by hydration, when mucus is wetter, it is thinner, less sticky, and easier to expel from the body, either through coughing up drainage, or by blowing your nose.   Get plenty of rest.   Use saline nasal drops and blow your nose frequently. Run a humidifier at night and elevate the head of the bed. Vicks Vapor rub will help with congestion and cough. Steam showers and sinus massage for congestion.   Use Acetaminophen or Ibuprofen as needed for fever or pain. Avoid second hand smoke. Even the smallest exposure will worsen symptoms.   Over the counter medications you can try include Delsym for cough, a decongestant for congestion, and Mucinex or Robitussin as an expectorant. Be sure to just get the plain Mucinex or Robitussin that just has one medication (Guaifenesen). We don't recommend the combination products. Note, be sure to drink two glasses of water with each dose of Mucinex as the medication will not work well without adequate hydration.   You can also try a teaspoon of honey to see if this will help reduce cough. Throat lozenges can sometimes be beneficial as well.    This illness will typically last 7 - 10 days.   Please follow up with our clinic if you develop a fever greater than 101 F, symptoms worsen, or do not resolve in the next week.   Chronic Obstructive Pulmonary Disease Chronic obstructive pulmonary disease (COPD) is a common lung condition in which airflow from the lungs is limited. COPD is a general term that can be used to describe many different lung problems that limit airflow, including both chronic bronchitis and emphysema. If you have COPD, your lung function will probably never return to normal, but there  are measures you can take to improve lung function and make yourself feel better. CAUSES   Smoking (common).  Exposure to secondhand smoke.  Genetic problems.  Chronic inflammatory lung diseases or recurrent infections. SYMPTOMS  Shortness of breath, especially with physical activity.  Deep, persistent (chronic) cough with a large amount of thick mucus.  Wheezing.  Rapid breaths (tachypnea).  Gray or bluish discoloration (cyanosis) of the skin, especially in your fingers, toes, or lips.  Fatigue.  Weight loss.  Frequent infections or episodes when breathing symptoms become much worse (exacerbations).  Chest tightness. DIAGNOSIS Your health care provider will take a medical history and perform a physical examination to diagnose COPD. Additional tests for COPD may include:  Lung (pulmonary) function tests.  Chest X-ray.  CT scan.  Blood tests. TREATMENT  Treatment for COPD may include:  Inhaler and nebulizer medicines. These help manage the symptoms of COPD and make your breathing more comfortable.  Supplemental oxygen. Supplemental oxygen is only helpful if you have a low oxygen level in your blood.  Exercise and physical activity. These are beneficial for nearly all people with COPD.  Lung surgery or transplant.  Nutrition therapy to gain weight, if you are underweight.  Pulmonary rehabilitation. This may involve working with a team of health care providers and specialists, such as respiratory, occupational, and physical therapists. HOME CARE INSTRUCTIONS  Take all medicines (inhaled or pills) as directed by your health care provider.  Avoid over-the-counter medicines or cough syrups that dry up  your airway (such as antihistamines) and slow down the elimination of secretions unless instructed otherwise by your health care provider.  If you are a smoker, the most important thing that you can do is stop smoking. Continuing to smoke will cause further lung  damage and breathing trouble. Ask your health care provider for help with quitting smoking. He or she can direct you to community resources or hospitals that provide support.  Avoid exposure to irritants such as smoke, chemicals, and fumes that aggravate your breathing.  Use oxygen therapy and pulmonary rehabilitation if directed by your health care provider. If you require home oxygen therapy, ask your health care provider whether you should purchase a pulse oximeter to measure your oxygen level at home.  Avoid contact with individuals who have a contagious illness.  Avoid extreme temperature and humidity changes.  Eat healthy foods. Eating smaller, more frequent meals and resting before meals may help you maintain your strength.  Stay active, but balance activity with periods of rest. Exercise and physical activity will help you maintain your ability to do things you want to do.  Preventing infection and hospitalization is very important when you have COPD. Make sure to receive all the vaccines your health care provider recommends, especially the pneumococcal and influenza vaccines. Ask your health care provider whether you need a pneumonia vaccine.  Learn and use relaxation techniques to manage stress.  Learn and use controlled breathing techniques as directed by your health care provider. Controlled breathing techniques include:  Pursed lip breathing. Start by breathing in (inhaling) through your nose for 1 second. Then, purse your lips as if you were going to whistle and breathe out (exhale) through the pursed lips for 2 seconds.  Diaphragmatic breathing. Start by putting one hand on your abdomen just above your waist. Inhale slowly through your nose. The hand on your abdomen should move out. Then purse your lips and exhale slowly. You should be able to feel the hand on your abdomen moving in as you exhale.  Learn and use controlled coughing to clear mucus from your lungs. Controlled  coughing is a series of short, progressive coughs. The steps of controlled coughing are: 1. Lean your head slightly forward. 2. Breathe in deeply using diaphragmatic breathing. 3. Try to hold your breath for 3 seconds. 4. Keep your mouth slightly open while coughing twice. 5. Spit any mucus out into a tissue. 6. Rest and repeat the steps once or twice as needed. SEEK MEDICAL CARE IF:  You are coughing up more mucus than usual.  There is a change in the color or thickness of your mucus.  Your breathing is more labored than usual.  Your breathing is faster than usual. SEEK IMMEDIATE MEDICAL CARE IF:  You have shortness of breath while you are resting.  You have shortness of breath that prevents you from:  Being able to talk.  Performing your usual physical activities.  You have chest pain lasting longer than 5 minutes.  Your skin color is more cyanotic than usual.  You measure low oxygen saturations for longer than 5 minutes with a pulse oximeter. MAKE SURE YOU:  Understand these instructions.  Will watch your condition.  Will get help right away if you are not doing well or get worse.   This information is not intended to replace advice given to you by your health care provider. Make sure you discuss any questions you have with your health care provider.   Document Released: 11/04/2004 Document Revised:  02/15/2014 Document Reviewed: 09/21/2012 Elsevier Interactive Patient Education [employer].

## 2015-06-24 NOTE — Progress Notes (Signed)
Pre visit review using our clinic review tool, if applicable. No additional management support is needed unless otherwise documented below in the visit note. 

## 2015-06-24 NOTE — Progress Notes (Signed)
Subjective:    Patient ID: Troy Parrish, male    DOB: [birth year], 55 y.o.   MRN: [id number]   Troy Parrish is a 54 y.o. male who presents today for an acute visit.    HPI Comments: H/o COPD. No seasonal allergies.    URI  This is a new problem. The current episode started in the past 7 days. Associated symptoms include congestion, coughing, rhinorrhea and wheezing. Pertinent negatives include no chest pain, nausea, sore throat or vomiting. Treatments tried: tylenol cold and flu, hycodan. The treatment provided no relief.   Past Medical History  Diagnosis Date  . Essential hypertension 07/07/2011  . History of colon polyps   . Hyperlipidemia with target LDL less than 70 07/07/2011  . Recurrent upper respiratory infection (URI)   . CAD S/P percutaneous coronary angioplasty 07/07/2011; 6 & 10 2015    S/P PCI to all 3 major vessels;a)  Ant STEMI 2001- BMS-LAD x2 -->(redo PCI 6/'15 -- pLAD Xience DES 2.5 x 12- 2.75 mm, mLAD 2.25 x 12 - 2.7 mm), b) '06 UA --> Cx- OM DES; c) 2013 Inf MI BMS mRCA --> d) 10/'15 PCI dRCA Promus P DES 4.0 x 16 (4.25 mm); PTCA of RPL2 (2.0 mm) &RPDA (2.25 mm) 11/2013  . ST elevation myocardial infarction (STEMI) of anterior wall ([place]) 2001    History of -- 2 stents in early and distal mid LAD; prior cardiologist was Dr. Remi Haggard in [place]  . ST elevation myocardial infarction (STEMI) of inferior wall ([place]) 07/07/2011    Occluded RCA [id number] INTEGRITY; Echo 07/2013: EF 45-50%, Inferior & Posterolateral HK.   Marland Kitchen Unstable angina ([place])  2006; June 2015    Cx-OM - PCI 2.5 mm 13 mm Cypher DES [name], [place]) ; 07/2013: Severe ISR of both prox & Distal LAD stentS --> 2 DES stents (1 at prox edge of the proximal stent, 2nd covers the entire distal stent as well as proximal and distal edge stenose)   . COPD (chronic obstructive pulmonary disease) ([place])   . Former heavy tobacco smoker      quit in May 2015 after multiple attempts at trying to quit before   . Glucose  intolerance (pre-diabetes) June 2015     hemoglobin A1c 6.6  . Metabolic syndrome     Pre-diabetes, hypertension and truncal obesity as well as dyslipidemia  . Upper respiratory infection   . Heart murmur   . Pneumonia "several times"  . Chronic bronchitis ([place]) "used to get it q yr"  . OSA on CPAP   . GERD (gastroesophageal reflux disease)   . History of stomach ulcers   . Headache     "Imdur related; stopped taking it; headaches went away" (11/15/2013)  . Seizures ([place]) "several"    "last one was ~ 2011" (11/15/2013)  . Shortness of breath dyspnea   . Elevated WBC count   . Emphysema of lung ([place])   . Umbilical hernia    Allergies: Niacin and related Current Outpatient Prescriptions on File Prior to Visit  Medication Sig Dispense Refill  . albuterol (PROVENTIL HFA) 108 (90 Base) MCG/ACT inhaler Inhale 1 puff into the lungs every 6 (six) hours as needed. 1 Inhaler 2  . aspirin EC 81 MG tablet Take 162 mg by mouth at bedtime.    Marland Kitchen azelastine (OPTIVAR) 0.05 % ophthalmic solution Place 1 drop into both eyes daily. Reported on 03/14/2015    . buPROPion (WELLBUTRIN XL) 150 MG 24 hr tablet Take 150 mg  by mouth daily.    . clobetasol cream (TEMOVATE) [id number] % APPLY 1 APPLICATION TOPICALLY 2 (TWO) TIMES DAILY. 30 g 2  . fluticasone (FLONASE) 50 MCG/ACT nasal spray Place 2 sprays into both nostrils daily.    . isosorbide mononitrate (IMDUR) 30 MG 24 hr tablet Take 1 tablet (30 mg total) by mouth daily. 90 tablet 6  . losartan (COZAAR) 25 MG tablet TAKE 1 TABLET (25 MG TOTAL) BY MOUTH DAILY. 90 tablet 2  . metoprolol succinate (TOPROL-XL) 50 MG 24 hr tablet Take 1 tablet (50 mg total) by mouth daily. Take with or immediately following a meal. 90 tablet 3  . niacin (NIASPAN) 500 MG CR tablet Take 1 tablet (500 mg total) by mouth at bedtime. Takes 30 minutes after taking aspirin. 90 tablet 0  . nitroGLYCERIN (NITROSTAT) 0.4 MG SL tablet Place 1 tablet (0.4 mg total) under the tongue every 5 (five)  minutes as needed for chest pain. 25 tablet 3  . prasugrel (EFFIENT) 10 MG TABS tablet TAKE 1 TABLET (10 MG TOTAL) BY MOUTH DAILY. 30 tablet 0  . ranolazine (RANEXA) 500 MG 12 hr tablet Take 1 tablet (500 mg total) by mouth 2 (two) times daily. 60 tablet 6  . rosuvastatin (CRESTOR) 20 MG tablet TAKE 1 TABLET (20 MG TOTAL) BY MOUTH DAILY AT 6 PM. 30 tablet 3   No current facility-administered medications on file prior to visit.    Social History  Substance Use Topics  . Smoking status: Former Smoker -- 0.50 packs/day for 40 years    Types: Cigarettes    Quit date: 06/19/2013  . Smokeless tobacco: Never Used     Comment: Quit with Bupropion 150mg   . Alcohol Use: 0.6 oz/week    1 Cans of beer per week     Comment: rare    Review of Systems  Constitutional: Positive for fever and chills.  HENT: Positive for congestion, rhinorrhea and sinus pressure. Negative for sore throat.   Respiratory: Positive for cough, shortness of breath and wheezing.   Cardiovascular: Negative for chest pain, palpitations and leg swelling.  Gastrointestinal: Negative for nausea and vomiting.  Musculoskeletal: Negative for myalgias.      Objective:    BP 116/84 mmHg  Pulse 100  Temp(Src) 98.9 F (37.2 C)  Ht 5\' 11"  (1.803 m)  Wt 181 lb 6.4 oz (82.283 kg)  BMI 25.31 kg/m2  SpO2 95%   Physical Exam  Constitutional: Vital signs are normal. He appears well-developed and well-nourished.  HENT:  Head: Normocephalic and atraumatic.  Right Ear: Hearing, tympanic membrane, external ear and ear canal normal. No drainage, swelling or tenderness. Tympanic membrane is not injected, not erythematous and not bulging. No middle ear effusion. No decreased hearing is noted.  Left Ear: Hearing, tympanic membrane, external ear and ear canal normal. No drainage, swelling or tenderness. Tympanic membrane is not injected, not erythematous and not bulging.  No middle ear effusion. No decreased hearing is noted.  Nose: Nose  normal. Right sinus exhibits no maxillary sinus tenderness and no frontal sinus tenderness. Left sinus exhibits no maxillary sinus tenderness and no frontal sinus tenderness.  Mouth/Throat: Uvula is midline, oropharynx is clear and moist and mucous membranes are normal. No oropharyngeal exudate, posterior oropharyngeal edema, posterior oropharyngeal erythema or tonsillar abscesses.  Eyes: Conjunctivae are normal.  Cardiovascular: Regular rhythm and normal heart sounds.   Pulmonary/Chest: Effort normal and breath sounds normal. No respiratory distress. He has no wheezes. He has no rhonchi. He has  no rales.  Lymphadenopathy:       Head (right side): No submental, no submandibular, no tonsillar, no preauricular, no posterior auricular and no occipital adenopathy present.       Head (left side): No submental, no submandibular, no tonsillar, no preauricular, no posterior auricular and no occipital adenopathy present.    He has no cervical adenopathy.  Neurological: He is alert.  Skin: Skin is warm and dry.  Psychiatric: He has a normal mood and affect. His speech is normal and behavior is normal.  Vitals reviewed.      Assessment & Plan:   1. Cough Viral v allergic etiology. No evidence of acute bacterial infection at this time.  2. Chronic obstructive pulmonary disease, unspecified COPD, unspecified chronic bronchitis type Exacerbation. Not wheezing today. Wheezing is intermittent per patient. Afebrile. No acute respiratory distress.  - albuterol (PROVENTIL HFA) 108 (90 Base) MCG/ACT inhaler; Inhale 1 puff into the lungs every 6 (six) hours as needed.  Dispense: 1 Inhaler; Refill: 2    I am having Mr. Mizera maintain his aspirin EC, buPROPion, clobetasol cream, azelastine, fluticasone, albuterol, prasugrel, niacin, isosorbide mononitrate, losartan, nitroGLYCERIN, rosuvastatin, ranolazine, and metoprolol succinate.   No orders of the defined types were placed in this encounter.      Start medications as prescribed and explained to patient on After Visit Summary ( AVS). Risks, benefits, and alternatives of the medications and treatment plan prescribed today were discussed, and patient expressed understanding.   Education regarding symptom management and diagnosis given to patient.   Follow-up:Plan follow-up as discussed or as needed if any worsening symptoms or change in condition.   Continue to follow with Webb Silversmith, NP for routine health maintenance.   [name] and I agreed with plan.   Mable Paris, FNP

## 2015-07-04 ENCOUNTER — Ambulatory Visit (INDEPENDENT_AMBULATORY_CARE_PROVIDER_SITE_OTHER): Payer: 59 | Admitting: Pulmonary Disease

## 2015-07-04 ENCOUNTER — Encounter: Payer: Self-pay | Admitting: Pulmonary Disease

## 2015-07-04 VITALS — BP 130/90 | HR 82 | Temp 98.0°F | Ht 72.0 in | Wt 180.2 lb

## 2015-07-04 DIAGNOSIS — J449 Chronic obstructive pulmonary disease, unspecified: Secondary | ICD-10-CM

## 2015-07-04 DIAGNOSIS — J209 Acute bronchitis, unspecified: Secondary | ICD-10-CM

## 2015-07-04 DIAGNOSIS — G4733 Obstructive sleep apnea (adult) (pediatric): Secondary | ICD-10-CM | POA: Diagnosis not present

## 2015-07-04 MED ORDER — AZITHROMYCIN 250 MG PO TABS: ORAL_TABLET | ORAL | Status: DC

## 2015-07-04 NOTE — Progress Notes (Signed)
Current Outpatient Prescriptions on File Prior to Visit  Medication Sig  . albuterol (PROVENTIL HFA) 108 (90 Base) MCG/ACT inhaler Inhale 1 puff into the lungs every 6 (six) hours as needed.  Marland Kitchen aspirin EC 81 MG tablet Take 162 mg by mouth at bedtime.  Marland Kitchen azelastine (OPTIVAR) 0.05 % ophthalmic solution Place 1 drop into both eyes daily. Reported on 03/14/2015  . benzonatate (TESSALON) 100 MG capsule Take 1 capsule (100 mg total) by mouth 3 (three) times daily as needed for cough.  Marland Kitchen buPROPion (WELLBUTRIN XL) 150 MG 24 hr tablet Take 150 mg by mouth daily.  . clobetasol cream (TEMOVATE) AB-123456789 % APPLY 1 APPLICATION TOPICALLY 2 (TWO) TIMES DAILY.  . fluticasone (FLONASE) 50 MCG/ACT nasal spray Place 2 sprays into both nostrils daily.  . isosorbide mononitrate (IMDUR) 30 MG 24 hr tablet Take 1 tablet (30 mg total) by mouth daily.  Marland Kitchen losartan (COZAAR) 25 MG tablet TAKE 1 TABLET (25 MG TOTAL) BY MOUTH DAILY.  . metoprolol succinate (TOPROL-XL) 50 MG 24 hr tablet Take 1 tablet (50 mg total) by mouth daily. Take with or immediately following a meal.  . niacin (NIASPAN) 500 MG CR tablet Take 1 tablet (500 mg total) by mouth at bedtime. Takes 30 minutes after taking aspirin.  . nitroGLYCERIN (NITROSTAT) 0.4 MG SL tablet Place 1 tablet (0.4 mg total) under the tongue every 5 (five) minutes as needed for chest pain.  . prasugrel (EFFIENT) 10 MG TABS tablet TAKE 1 TABLET (10 MG TOTAL) BY MOUTH DAILY.  Marland Kitchen predniSONE (DELTASONE) 10 MG tablet Take 4 tablets ( total 40 mg) by mouth for 2 days; take 3 tablets ( total 30 mg) by mouth for 2 days; take 2 tablets ( total 20 mg) by mouth for 1 day; take 1 tablet ( total 10 mg) by mouth for 1 day.  . ranolazine (RANEXA) 500 MG 12 hr tablet Take 1 tablet (500 mg total) by mouth 2 (two) times daily.  . rosuvastatin (CRESTOR) 20 MG tablet TAKE 1 TABLET (20 MG TOTAL) BY MOUTH DAILY AT 6 PM.   No current facility-administered medications on file prior to visit.    Chief  Complaint  Patient presents with  . Follow-up    itchy throat, chest congestion, cough, chest tightness.  Pt has not used CPAP machine.     Tests Echo 07/10/11 >> EF 50 to [id number], grade 1 diastolic dysfx  Spirometry 11/09/11 >> FEV1 3.26 (67%), FEV1% 67 PFT 04/11/13 >> FEV1 2.65 (69%), FEV1% 72, TLC 6.58 (94%), DLCO 70%, +BD  PSG 04/13/13 >> AHI 59.2, SpO2 low 86%. Auto CPAP 05/26/13 to 06/08/13 >> used on 14 of 14 nights with average 4 hrs 51 min.  Average AHI 1.5 with median CPAP 8 cm H2O and 95 th percentile CPAP 11 cm H2O.  Past medical history CAD, HTN, HLD, GERD, Seizures, Allergic rhinitis  Past surgical history, Family history, Social history, Allergies reviewed  Vital signs BP 130/90 mmHg  Pulse 82  Temp(Src) 98 F (36.7 C) (Oral)  Ht 6' (1.829 m)  Wt 180 lb 3.2 oz (81.738 kg)  BMI 24.43 kg/m2  SpO2 97%   History of Present Illness: Troy Parrish is a 55 y.o. male former smoker with OSA, and borderline COPD.  He developed a cough about 3 weeks ago after trip to [place].  He though there was lots of pollen.  He then had sinus congestion which has settled into his chest.  He was given prednisone course by PCP >>  not much help.  Doesn't feel like inhalers have helped as much as usual.  He is not aware of fever.  Denies nausea, abdominal pain, or skin rash.  He hasn't been able to use his CPAP due to cough.  Physical Exam:  General - frequent coughing episodes ENT - No sinus tenderness, no oral exudate, no LAN, high arched palate, MP 4, enlarged tongue, poor dentition Cardiac - s1s2 regular, no murmur Chest - No wheeze/rales/dullness Back - No focal tenderness Abd - Soft, non-tender Ext - No edema Neuro - Normal strength Skin - No rashes Psych - normal mood, and behavior   Assessment/Plan:  Acute bronchitis. - will give course of zithromax - prn mucinex, delsym - he is to call if not improved - defer CXR for now  COPD. - he can try Breo daily for now -  continue prn albuterol  Obstructive sleep apnea. - he will resume using CPAP once cough improved   Patient Instructions  Can resume breo one puff daily for one week > rinse mouth after each use  Zpak >> 2 pills daily on day 1, then 1 pill daily for 4 days  Can try mucinex or delsym to help with cough  Albuterol every four to six hours as needed for cough, wheeze, or chest congestion  Call if not feeling better  Follow up in 6 months     Chesley Mires, MD [facility] Pager:  [phone] 07/04/2015, 4:37 PM

## 2015-07-04 NOTE — Patient Instructions (Signed)
Can resume breo one puff daily for one week > rinse mouth after each use  Zpak >> 2 pills daily on day 1, then 1 pill daily for 4 days  Can try mucinex or delsym to help with cough  Albuterol every four to six hours as needed for cough, wheeze, or chest congestion  Call if not feeling better  Follow up in 6 months

## 2015-07-10 ENCOUNTER — Encounter: Payer: Self-pay | Admitting: Surgery

## 2015-07-13 ENCOUNTER — Other Ambulatory Visit: Payer: Self-pay | Admitting: Cardiology

## 2015-07-29 ENCOUNTER — Other Ambulatory Visit: Payer: Self-pay

## 2015-07-29 MED ORDER — ROSUVASTATIN CALCIUM 20 MG PO TABS: 20.0000 mg | ORAL_TABLET | Freq: Every day | ORAL | Status: DC

## 2015-07-29 NOTE — Telephone Encounter (Signed)
Rx(s) sent to pharmacy electronically.  

## 2015-08-29 ENCOUNTER — Observation Stay (HOSPITAL_COMMUNITY)
Admission: EM | Admit: 2015-08-29 | Discharge: 2015-08-30 | Disposition: A | Discharge status: Home / Self Care | Payer: 59 | Attending: Cardiology | Admitting: Cardiology

## 2015-08-29 ENCOUNTER — Encounter (HOSPITAL_COMMUNITY): Payer: Self-pay | Admitting: Emergency Medicine

## 2015-08-29 ENCOUNTER — Emergency Department (HOSPITAL_COMMUNITY): Payer: 59

## 2015-08-29 DIAGNOSIS — I252 Old myocardial infarction: Secondary | ICD-10-CM | POA: Insufficient documentation

## 2015-08-29 DIAGNOSIS — R079 Chest pain, unspecified: Secondary | ICD-10-CM | POA: Diagnosis present

## 2015-08-29 DIAGNOSIS — E785 Hyperlipidemia, unspecified: Secondary | ICD-10-CM | POA: Insufficient documentation

## 2015-08-29 DIAGNOSIS — J449 Chronic obstructive pulmonary disease, unspecified: Secondary | ICD-10-CM | POA: Insufficient documentation

## 2015-08-29 DIAGNOSIS — Z79899 Other long term (current) drug therapy: Secondary | ICD-10-CM | POA: Diagnosis not present

## 2015-08-29 DIAGNOSIS — R0789 Other chest pain: Principal | ICD-10-CM | POA: Insufficient documentation

## 2015-08-29 DIAGNOSIS — I2510 Atherosclerotic heart disease of native coronary artery without angina pectoris: Secondary | ICD-10-CM | POA: Insufficient documentation

## 2015-08-29 DIAGNOSIS — I10 Essential (primary) hypertension: Secondary | ICD-10-CM | POA: Insufficient documentation

## 2015-08-29 DIAGNOSIS — F17210 Nicotine dependence, cigarettes, uncomplicated: Secondary | ICD-10-CM | POA: Diagnosis not present

## 2015-08-29 DIAGNOSIS — Z7982 Long term (current) use of aspirin: Secondary | ICD-10-CM | POA: Insufficient documentation

## 2015-08-29 DIAGNOSIS — I200 Unstable angina: Secondary | ICD-10-CM | POA: Diagnosis not present

## 2015-08-29 DIAGNOSIS — Z955 Presence of coronary angioplasty implant and graft: Secondary | ICD-10-CM | POA: Diagnosis not present

## 2015-08-29 LAB — BASIC METABOLIC PANEL
Anion gap: 8 (ref 5–15)
BUN: 11 mg/dL (ref 6–20)
CO2: 24 mmol/L (ref 22–32)
Calcium: 8.7 mg/dL — ABNORMAL LOW (ref 8.9–10.3)
Chloride: 106 mmol/L (ref 101–111)
Creatinine, Ser: 1.18 mg/dL (ref 0.61–1.24)
GFR calc Af Amer: >60 mL/min (ref >60)
GFR calc non Af Amer: >60 mL/min (ref >60)
Glucose, Bld: 119 mg/dL — ABNORMAL HIGH (ref 65–99)
Potassium: 3.7 mmol/L (ref 3.5–5.1)
Sodium: 138 mmol/L (ref 135–145)

## 2015-08-29 LAB — TROPONIN I
Troponin I: <0.03 ng/mL (ref <0.03)
Troponin I: <0.03 ng/mL (ref <0.03)
Troponin I: <0.03 ng/mL (ref <0.03)

## 2015-08-29 LAB — CBC
HCT: 51.9 % (ref 39.0–52.0)
Hemoglobin: 17.3 g/dL — ABNORMAL HIGH (ref 13.0–17.0)
MCH: 30 pg (ref 26.0–34.0)
MCHC: 33.3 g/dL (ref 30.0–36.0)
MCV: 89.9 fL (ref 78.0–100.0)
Platelets: 350 10*3/uL (ref 150–400)
RBC: 5.77 MIL/uL (ref 4.22–5.81)
RDW: 13.8 % (ref 11.5–15.5)
WBC: 13.5 10*3/uL — ABNORMAL HIGH (ref 4.0–10.5)

## 2015-08-29 LAB — I-STAT TROPONIN, ED: Troponin i, poc: 0 ng/mL (ref 0.00–0.08)

## 2015-08-29 LAB — PROTIME-INR
INR: 1.08 (ref 0.00–1.49)
Prothrombin Time: 14.2 seconds (ref 11.6–15.2)

## 2015-08-29 MED ORDER — METOPROLOL SUCCINATE ER 50 MG PO TB24
50.0000 mg | ORAL_TABLET | Freq: Every day | ORAL | Status: DC
  Administered 2015-08-29 – 2015-08-30 (×2): 50 mg via ORAL
  Filled 2015-08-29 (×2): qty 1

## 2015-08-29 MED ORDER — ALBUTEROL SULFATE (2.5 MG/3ML) 0.083% IN NEBU: 2.5000 mg | INHALATION_SOLUTION | Freq: Four times a day (QID) | RESPIRATORY_TRACT | Status: DC | PRN

## 2015-08-29 MED ORDER — ISOSORBIDE MONONITRATE ER 30 MG PO TB24
30.0000 mg | ORAL_TABLET | Freq: Every day | ORAL | Status: DC
  Administered 2015-08-29 – 2015-08-30 (×2): 30 mg via ORAL
  Filled 2015-08-29 (×2): qty 1

## 2015-08-29 MED ORDER — LOSARTAN POTASSIUM 25 MG PO TABS
25.0000 mg | ORAL_TABLET | Freq: Every day | ORAL | Status: DC
  Administered 2015-08-29 – 2015-08-30 (×2): 25 mg via ORAL
  Filled 2015-08-29 (×2): qty 1

## 2015-08-29 MED ORDER — ACETAMINOPHEN 325 MG PO TABS
650.0000 mg | ORAL_TABLET | Freq: Four times a day (QID) | ORAL | Status: DC | PRN
  Administered 2015-08-29 – 2015-08-30 (×2): 650 mg via ORAL
  Filled 2015-08-29 (×2): qty 2

## 2015-08-29 MED ORDER — BENZONATATE 100 MG PO CAPS: 100.0000 mg | ORAL_CAPSULE | Freq: Three times a day (TID) | ORAL | Status: DC | PRN

## 2015-08-29 MED ORDER — ASPIRIN EC 81 MG PO TBEC
162.0000 mg | DELAYED_RELEASE_TABLET | Freq: Every day | ORAL | Status: DC
  Administered 2015-08-29: 162 mg via ORAL
  Filled 2015-08-29: qty 2

## 2015-08-29 MED ORDER — ALBUTEROL SULFATE HFA 108 (90 BASE) MCG/ACT IN AERS: 1.0000 | INHALATION_SPRAY | Freq: Four times a day (QID) | RESPIRATORY_TRACT | Status: DC | PRN

## 2015-08-29 MED ORDER — KETOTIFEN FUMARATE 0.025 % OP SOLN
1.0000 [drp] | Freq: Two times a day (BID) | OPHTHALMIC | Status: DC
  Administered 2015-08-29 – 2015-08-30 (×3): 1 [drp] via OPHTHALMIC
  Filled 2015-08-29: qty 5

## 2015-08-29 MED ORDER — RANOLAZINE ER 500 MG PO TB12
500.0000 mg | ORAL_TABLET | Freq: Two times a day (BID) | ORAL | Status: DC
  Administered 2015-08-29 – 2015-08-30 (×3): 500 mg via ORAL
  Filled 2015-08-29 (×3): qty 1

## 2015-08-29 MED ORDER — ENSURE ENLIVE PO LIQD
237.0000 mL | Freq: Two times a day (BID) | ORAL | Status: DC
  Administered 2015-08-30: 237 mL via ORAL

## 2015-08-29 MED ORDER — ROSUVASTATIN CALCIUM 10 MG PO TABS
20.0000 mg | ORAL_TABLET | Freq: Every day | ORAL | Status: DC
  Administered 2015-08-29 – 2015-08-30 (×2): 20 mg via ORAL
  Filled 2015-08-29: qty 1
  Filled 2015-08-29 (×2): qty 2
  Filled 2015-08-29: qty 1

## 2015-08-29 MED ORDER — NITROGLYCERIN 0.4 MG SL SUBL: 0.4000 mg | SUBLINGUAL_TABLET | SUBLINGUAL | Status: DC | PRN

## 2015-08-29 MED ORDER — PRASUGREL HCL 10 MG PO TABS
10.0000 mg | ORAL_TABLET | Freq: Every day | ORAL | Status: DC
  Administered 2015-08-29 – 2015-08-30 (×2): 10 mg via ORAL
  Filled 2015-08-29 (×2): qty 1

## 2015-08-29 NOTE — ED Notes (Signed)
Note from Cardiology that he has not taken any of his medications for 2 months per MD [place].

## 2015-08-29 NOTE — H&P (Signed)
History & Physical    Patient ID: Troy Parrish MRN: [id number], DOB/AGE: [id number]   Admit date: 08/29/2015   Primary Physician: Webb Silversmith, NP Primary Cardiologist: Ellyn Hack  Patient Profile    55 y o man with CP   Past Medical History    Past Medical History  Diagnosis Date  . Essential hypertension 07/07/2011  . History of colon polyps   . Hyperlipidemia with target LDL less than 70 07/07/2011  . Recurrent upper respiratory infection (URI)   . CAD S/P percutaneous coronary angioplasty 07/07/2011; 6 & 10 2015    S/P PCI to all 3 major vessels;a)  Ant STEMI 2001- BMS-LAD x2 -->(redo PCI 6/'15 -- pLAD Xience DES 2.5 x 12- 2.75 mm, mLAD 2.25 x 12 - 2.7 mm), b) '06 UA --> Cx- OM DES; c) 2013 Inf MI BMS mRCA --> d) 10/'15 PCI dRCA Promus P DES 4.0 x 16 (4.25 mm); PTCA of RPL2 (2.0 mm) &RPDA (2.25 mm) 11/2013  . ST elevation myocardial infarction (STEMI) of anterior wall ([place]) 2001    History of -- 2 stents in early and distal mid LAD; prior cardiologist was Dr. Remi Haggard in [place]  . ST elevation myocardial infarction (STEMI) of inferior wall ([place]) 07/07/2011    Occluded RCA 7.82N56 INTEGRITY; Echo 07/2013: EF 45-50%, Inferior & Posterolateral HK.   Marland Kitchen Unstable angina ([place])  2006; June 2015    Cx-OM - PCI 2.5 mm 13 mm Cypher DES [name], [place]) ; 07/2013: Severe ISR of both prox & Distal LAD stentS --> 2 DES stents (1 at prox edge of the proximal stent, 2nd covers the entire distal stent as well as proximal and distal edge stenose)   . COPD (chronic obstructive pulmonary disease) ([place])   . Former heavy tobacco smoker      quit in May 2015 after multiple attempts at trying to quit before   . Glucose intolerance (pre-diabetes) June 2015     hemoglobin A1c 6.6  . Metabolic syndrome     Pre-diabetes, hypertension and truncal obesity as well as dyslipidemia  . Upper respiratory infection   . Heart murmur   . Pneumonia "several times"  . Chronic bronchitis ([place]) "used to get it  q yr"  . OSA on CPAP   . GERD (gastroesophageal reflux disease)   . History of stomach ulcers   . Headache     "Imdur related; stopped taking it; headaches went away" (11/15/2013)  . Seizures ([place]) "several"    "last one was ~ 2011" (11/15/2013)  . Shortness of breath dyspnea   . Elevated WBC count   . Emphysema of lung ([place])   . Umbilical hernia     Past Surgical History  Procedure Laterality Date  . Nm myoview ([facility] hx)  11/30/2011    EF 45% ,exercise 10 METS, infarct\scar w mild perinfarct ischemia -basal inferolat and mid inferolat region  . Met/cpet  11/09/2011    submax. effort 1.05 RER peak V02 was 51% ,chronotropic incomp.hrt rate lows 80s to 100  . Transthoracic echocardiogram  07/31/2013; February 2017    a. LVEF 45-50. Inferior posterior hypokinesis with mild dilation. Apparent normal diastolic pressures;   . Right heart cath  June 2015    Normal pressures with severely reduced cardiac output and index. (3.25/1.55)  . Left heart catheterization with coronary angiogram N/A 07/07/2011    Procedure: LEFT HEART CATHETERIZATION WITH CORONARY ANGIOGRAM;  Surgeon: Leonie Man, MD;  Location: [facility] CATH LAB;  Service: Cardiovascular;  inferolat  post LV infarct ST-elevation MI  . Percutaneous coronary stent intervention (pci-s)  07/07/2011    Procedure: PERCUTANEOUS CORONARY STENT INTERVENTION (PCI-S);  Surgeon: Leonie Man, MD;  Location: [facility] CATH LAB;  Service: Cardiovascular;;occluded RCA ,Integrity Resolute DES 2.75 x 18 mm stent - post-dilated 3.1 mm  . Left and right heart catheterization with coronary angiogram N/A 07/30/2013    Procedure: LEFT AND RIGHT HEART CATHETERIZATION WITH CORONARY ANGIOGRAM;  Surgeon: Leonie Man, MD;  Location: [facility] CATH LAB;  Service: Cardiovascular;  2 separate P & mLAD lesions -> PCI, MOd-Severe dRCA disesase with diffuse RPL/PDA disease (FFR)  . Percutaneous coronary stent intervention (pci-s)  07/30/2013    Procedure: PERCUTANEOUS CORONARY STENT  INTERVENTION (PCI-S);  Surgeon: Leonie Man, MD;  Location: [facility] CATH LAB;  Service: Cardiovascular;;Distal mid LAD: Xience Alpine DES 2.25 mm x 28 mm (overlapping proximal and distal edge of previous stent) - 2.7 mm; proximal LAD 2.5 mm x 12 mm Xience Alpine DES (2.8 mm);; RCA 60-70% stenosis planned staged procedure  . Left heart cath  08/03/2013    Procedure: LEFT HEART CATH;  Surgeon: Leonie Man, MD;  Location: [facility] CATH LAB;  Service: Cardiovascular;;FFR of RCA - non-flow-limiting  . Percutaneous coronary stent intervention (pci-s) N/A 11/15/2013    Procedure: PERCUTANEOUS CORONARY STENT INTERVENTION (PCI-S);  Surgeon: Leonie Man, MD;  Location: [facility] CATH LAB;  Service: Cardiovascular;  Patent Cx & LAD Stents; PCI -dRCA Promus Premier DES 4.0 mm x 16 mm (4.25 mm) dRCA, 2.0 mm Cutting PTCA of RPL2 [place] & POBA of mRPDA  . Coronary angioplasty with stent placement  2001    ANTERIOR mi with a  PCI to LAD; [place], [place] - Dr. Dorris Fetch  . Coronary angioplasty with stent placement  2006    [place] by Dr Dorris Fetch -lesion lft circ/OM1 with 2.5 x 27m Cypher DES  . Colonoscopy with propofol N/A 03/28/2015    Procedure: COLONOSCOPY WITH PROPOFOL;  Surgeon: DLucilla Lame MD;  [place]  Service: Endoscopy;  Laterality: N/A;  . Esophagogastroduodenoscopy (egd) with propofol N/A 03/28/2015    Procedure: ESOPHAGOGASTRODUODENOSCOPY (EGD) WITH PROPOFOL;  Surgeon: DLucilla Lame MD;  [place]  Service: Endoscopy;  Laterality: N/A;  . Polypectomy  03/28/2015    Procedure: POLYPECTOMY;  Surgeon: DLucilla Lame MD;  [place]  Service: Endoscopy;;  . Cardiac mri [facility]  February 2017    EF 51%. Mod HK of basal-mid Inf wall, mild HK of basal inferoseptum & basal-mid inferolateral wall with hyper enhancement (c/w subendocard RCA MI with significant viability), focal hyper enhancement of apical wall c/w dLAD subendocard MI & complete LAD viability. . No  aortic stenosis. Normal RV function. No Ischemia on Adenosine Stress.  . Met/cpet  September 2016    [facility]: Mild functional impairment due primarily to mild pulmonary and circulatory limitations. Also suggest physical deconditioning (seen with low-normal aerobic reserve). Mild VQ mismatch with exercise.  Ventilatory reserve exhausted with peak exercise demonstrated pulmonary limitation. No significant decrease in postexercise FEV1 compared to rest --> suggest no exercise induced bronchospasm  . Umbilical hernia repair N/A 04/02/2015    Procedure: HERNIA REPAIR UMBILICAL ADULT;  Surgeon: RFlorene Glen MD;  Location: [facility] ORS;  Service: General;  Laterality: N/A;  . Insertion of mesh N/A 04/02/2015    Procedure: INSERTION OF MESH;  Surgeon: RFlorene Glen MD;  Location: [facility] ORS;  Service: General;  Laterality: N/A;     Allergies  Allergies  Allergen Reactions  . Niacin And Related Hives and Itching    History of Present Illness    73 y o man with PMH of CAD s/p PCI, HFpEF? OSA, smoking. He reports new acute onset of CP today at rest. With left arm pain.  He does not usually have CP on a day to day basis. This pain was somewhat different than his previous Cp episodes. Lasted minutes. Had diaphoresis. No syncope. Started smoking again (1 pack per day) . Reports non compliance with meds (stopped effient 2 m ago, takes asa still), does not use CPAP. Reports restless legs at night preventing him from sleep. His BP is well controlled.   Reports also dizzy spells on a daily basis. Start 1 m ago. Mostly orthostatic.   Prior cardiac history:  His most recent intervention was back in October of 2015 for persistent class III exertional dyspnea and chest pain. Perform PCI of the distal RCA with PTCA of both the posterolateral branches. Adenosine stress cardiac MRI, with and without contrast, was performed on a 3T scanner to evaluate myocardial morphology, function, viability, and inducible  myocardial ischemia, in a patient with a long history of coronary artery disease. He has history of multiple episodes of acute coronary syndromes, including anterior and inferior myocardial infarction, and multiple percutaneous interventions in the LAD, LCx, and RCA. He is complaining of exertional dyspnea and atypical chest pain.  Findings:  1. The left ventricle is normal in cavity size and wall thickness. Global systolic function is mildly reduced. The LV ejection fraction is quantified at 51%. There is moderate hypokinesis of the basal to mid inferior wall, with mild hypokinesis of the apical segment. There is mild hypokinesis of the basal inferoseptum and basal to mid inferolateral wall. Metal artifact from the proximal LAD stent is noted adjacent to the basal anterior wall.  2. The right ventricle is normal in cavity size, wall thickness, and systolic function.  3. Both atria are normal in size.  4. The aortic valve is trileaflet, with no significant aortic valve stenosis or regurgitation. There is no significant valvular disease.   5. Delayed enhancement imaging is abnormal. There are two areas of hyper-enhancement: a. There is 25-50% hyperenhancement of the basal to apical inferior wall, with extension into the basal inferoseptum and basal to mid inferolateral wall, consistent with a subendocardial myocardial infarction in the RCA perfusion territory. There is lipomatous metaplasia in this infarct. There is significant viability (>50%) of the RCA territory.  b. There is a focal (<25%) subendocardial hyperenhancement of the apex, consistent with a subendocardial myocardial infarction at the distal LAD perfusion territory. There is complete viability of the rest of the LAD territory. The left circumflex arterial perfusion territory is completely viable.  6. Adenosine stress perfusion imaging demonstrates a perfusion defect at the site of the RCA infarct, with no evidence of  peri-infarct ischemia. There is no evidence of inducible myocardial ischemia.   CORE EXAM   Home Medications    Prior to Admission medications   Medication Sig Start Date End Date Taking? Authorizing Provider  albuterol (PROVENTIL HFA) 108 (90 Base) MCG/ACT inhaler Inhale 1 puff into the lungs every 6 (six) hours as needed. 06/24/15  Yes Burnard Hawthorne, FNP  aspirin EC 81 MG tablet Take 162 mg by mouth at bedtime.   Yes Historical Provider, MD  azelastine (OPTIVAR) 0.05 % ophthalmic solution Place 1 drop into both eyes daily. Reported on 03/14/2015 03/06/15   Historical Provider, MD  azithromycin (  ZITHROMAX) 250 MG tablet 2 pills on day 1, then 1 pill daily for 4 days 07/04/15   Chesley Mires, MD  benzonatate (TESSALON) 100 MG capsule Take 1 capsule (100 mg total) by mouth 3 (three) times daily as needed for cough. 06/24/15   Burnard Hawthorne, FNP  buPROPion (WELLBUTRIN XL) 150 MG 24 hr tablet Take 150 mg by mouth daily.    Historical Provider, MD  clobetasol cream (TEMOVATE) 8.82 % APPLY 1 APPLICATION TOPICALLY 2 (TWO) TIMES DAILY. 12/10/14   Jearld Fenton, NP  EFFIENT 10 MG TABS tablet TAKE 1 TABLET (10 MG TOTAL) BY MOUTH DAILY. 07/14/15   Leonie Man, MD  fluticasone [facility]) 50 MCG/ACT nasal spray Place 2 sprays into both nostrils daily. 03/06/15   Historical Provider, MD  isosorbide mononitrate (IMDUR) 30 MG 24 hr tablet Take 1 tablet (30 mg total) by mouth daily. 06/04/15   Leonie Man, MD  losartan (COZAAR) 25 MG tablet TAKE 1 TABLET (25 MG TOTAL) BY MOUTH DAILY. 06/04/15   Leonie Man, MD  metoprolol succinate (TOPROL-XL) 50 MG 24 hr tablet Take 1 tablet (50 mg total) by mouth daily. Take with or immediately following a meal. 06/16/15   Leonie Man, MD  niacin (NIASPAN) 500 MG CR tablet Take 1 tablet (500 mg total) by mouth at bedtime. Takes 30 minutes after taking aspirin. 06/04/15   Leonie Man, MD  nitroGLYCERIN (NITROSTAT) 0.4 MG SL tablet Place 1 tablet (0.4 mg total)  under the tongue every 5 (five) minutes as needed for chest pain. 06/04/15   Leonie Man, MD  predniSONE (DELTASONE) 10 MG tablet Take 4 tablets ( total 40 mg) by mouth for 2 days; take 3 tablets ( total 30 mg) by mouth for 2 days; take 2 tablets ( total 20 mg) by mouth for 1 day; take 1 tablet ( total 10 mg) by mouth for 1 day. 06/24/15   Burnard Hawthorne, FNP  ranolazine (RANEXA) 500 MG 12 hr tablet Take 1 tablet (500 mg total) by mouth 2 (two) times daily. 06/04/15   Leonie Man, MD  rosuvastatin (CRESTOR) 20 MG tablet Take 1 tablet (20 mg total) by mouth daily. 07/29/15   Leonie Man, MD    Family History    Family History  Problem Relation Age of Onset  . Coronary artery disease Father   . Heart disease Father   . Stroke Father   . Hypertension Father   . Hyperlipidemia Mother   . Diabetes Mother   . Hyperlipidemia Maternal Grandmother   . Diabetes Maternal Grandmother   . Hyperlipidemia Maternal Grandfather   . Diabetes Maternal Grandfather   . Emphysema Maternal Grandfather   . Heart disease Paternal Grandmother   . Heart disease Paternal Grandfather   . Cancer Maternal Aunt     breast    Social History    Social History   Social History  . Marital Status: Married    Spouse Name: N/A  . Number of Children: N/A  . Years of Education: N/A   Occupational History  . Not on file.   Social History Main Topics  . Smoking status: Current Every Day Smoker -- 1.00 packs/day for 40 years    Types: Cigarettes    Last Attempt to Quit: 06/19/2013  . Smokeless tobacco: Never Used     Comment: Quit with Bupropion 158m  . Alcohol Use: 0.6 oz/week    1 Cans of beer per week  Comment: rare  . Drug Use: No  . Sexual Activity: Yes   Other Topics Concern  . Not on file   Social History Narrative   He is a married father of 19, grandfather of 85. He does not really get routine exercise.    He is down to 5 or 6 cigarettes a day. He is very seriously wanting to quit.  This is a significant cutback for him, but he has pretty much been told he needs to stop by his wife, and so he fully intends to do so. He is willing to try the patches or whatever. He has a social alcoholic beverage every now and then.      Review of Systems    General:  No chills, fever, night sweats or weight changes.  Cardiovascular:  No chest pain, dyspnea on exertion, edema, orthopnea, palpitations, paroxysmal nocturnal dyspnea. Dermatological: No rash, lesions/masses Respiratory: No cough, dyspnea Urologic: No hematuria, dysuria Abdominal:   No nausea, vomiting, diarrhea, bright red blood per rectum, melena, or hematemesis Neurologic:  No visual changes, wkns, changes in mental status. All other systems reviewed and are otherwise negative except as noted above.  Physical Exam    Blood pressure 101/77, pulse 97, temperature 98.2 F (36.8 C), temperature source Oral, resp. rate 18, height 6' (1.829 m), weight 79.379 kg (175 lb), SpO2 93 %.  General: Pleasant, NAD Psych: Normal affect. Neuro: Alert and oriented X 3. Moves all extremities spontaneously. HEENT: Normal  Neck: Supple without bruits or JVD. Lungs:  Resp regular and unlabored, CTA. Heart: RRR no s3, s4, or murmurs. Abdomen: Soft, non-tender, non-distended, BS + x 4.  Extremities: No clubbing, cyanosis or edema. DP/PT/Radials 2+ and equal bilaterally.  Labs    Troponin [facility] Test)  Recent Labs  08/29/15 0345  TROPIPOC 0.00   No results for input(s): CKTOTAL, CKMB, TROPONINI in the last 72 hours. Lab Results  Component Value Date   WBC 13.5* 08/29/2015   HGB 17.3* 08/29/2015   HCT 51.9 08/29/2015   MCV 89.9 08/29/2015   PLT 350 08/29/2015    Recent Labs Lab 08/29/15 0345  NA 138  K 3.7  CL 106  CO2 24  BUN 11  CREATININE 1.18  CALCIUM 8.7*  GLUCOSE 119*   Lab Results  Component Value Date   CHOL 224* 03/04/2015   HDL 37.70* 03/04/2015   LDLCALC 62 07/26/2013   TRIG 293.0* 03/04/2015    Lab Results  Component Value Date   DDIMER 0.44 03/12/2015     Radiology Studies    Dg Chest 2 View  08/29/2015  CLINICAL DATA:  Chest pain, nausea tonight. History of heart attack, COPD. EXAM: CHEST  2 VIEW COMPARISON:  Chest radiograph March 14, 2015 FINDINGS: Cardiomediastinal silhouette is unremarkable unchanged. Coronary artery calcification versus stent. Mild chronic bronchitic changes increased lung volumes compatible with COPD with bibasilar strandy densities. No pleural effusion or focal consolidation. Mild biapical pleural thickening. No pneumothorax. Mild degenerative change of the thoracic spine. IMPRESSION: COPD, no superimposed acute cardiopulmonary process. Electronically Signed   By: Elon Alas M.D.   On: 08/29/2015 04:07    ECG & Cardiac Imaging    NSR, no ischemic changes  Assessment & Plan    Mr Badie is a 49 y o man with PMH of CAD and multiple risk factors. Now with UA. He started smoking again, untreated OSA. His pain in concerning for cardiac origin. His last Stress in 2016 without evidence of ischemia. I  have addressed need for medication compliance and smoking cessation.   Plan: - cont home meds. incl ASA, effient - trend torop - NPO for possible cath vs stress - Consider holding one of his anti hypertensive given borderline BP and OH symptoms.   Signed, Cristina Gong, MD  08/29/2015, 4:49 AM

## 2015-08-29 NOTE — Plan of Care (Signed)
Problem: Consults Goal: Chest Pain Patient Education (See Patient Education module for education specifics.) Outcome: Progressing Pt has no complaints of pain at this time. Given am medications. VSS. Pt states they stopped taking medication because they "lost their routine and just quit taking them for no apparent reason, it's not a financial issue". Pt educated on the importance of compliance. Reinforced with wife in the room.

## 2015-08-29 NOTE — ED Notes (Signed)
Patient transported to X-ray 

## 2015-08-29 NOTE — ED Notes (Signed)
Pt resting in bed sleeping, denies pain or any complant. Currently waiting for admission orders from cardiology and bed request. Wife and son at bedside. Updated on plan of care.

## 2015-08-29 NOTE — ED Notes (Signed)
Cardiology paged to enter bed request for patient

## 2015-08-29 NOTE — ED Provider Notes (Signed)
CSN: [id number]     Arrival date & time 08/29/15  0319 History   First MD Initiated Contact with Patient 08/29/15 [phone]     Chief Complaint  Patient presents with  . Chest Pain     (Consider location/radiation/quality/duration/timing/severity/associated sxs/prior Treatment) HPI Comments: Patient with extensive history of CAD, multiple AMI, multiple stents, non-compliant with Effient over the past month and a half -- presents with acute onset of chest pressure, diaphoresis, left arm numbness, feeling of impending doom starting at 2:30 AM. Patient woke his wife and EMS was called. Patient did not take any nitroglycerin. He was given a total of 324 mg of aspirin prior to arrival. Pain resolved during EMS transport. No palpitations, vomiting. Patient states that symptoms were somewhat similar to previous MI. No other treatments. Patient has had frequent cough. Patient has had episodes of near syncope daily during coughing over the past 2-3 weeks. He has not mentioned this to any of his doctors previously. Onset of symptoms acute. Course now resolved. Nothing makes symptoms better or worse.  Patient is a 55 y.o. male presenting with chest pain. The history is provided by the patient and medical records.  Chest Pain Associated symptoms: no abdominal pain, no back pain, no cough, no diaphoresis, no fever, no nausea, no palpitations, no shortness of breath and not vomiting     Past Medical History  Diagnosis Date  . Essential hypertension 07/07/2011  . History of colon polyps   . Hyperlipidemia with target LDL less than 70 07/07/2011  . Recurrent upper respiratory infection (URI)   . CAD S/P percutaneous coronary angioplasty 07/07/2011; 6 & 10 2015    S/P PCI to all 3 major vessels;a)  Ant STEMI 2001- BMS-LAD x2 -->(redo PCI 6/'15 -- pLAD Xience DES 2.5 x 12- 2.75 mm, mLAD 2.25 x 12 - 2.7 mm), b) '06 UA --> Cx- OM DES; c) 2013 Inf MI BMS mRCA --> d) 10/'15 PCI dRCA Promus P DES 4.0 x 16 (4.25 mm); PTCA  of RPL2 (2.0 mm) &RPDA (2.25 mm) 11/2013  . ST elevation myocardial infarction (STEMI) of anterior wall ([place]) 2001    History of -- 2 stents in early and distal mid LAD; prior cardiologist was Dr. Remi Haggard in [place]  . ST elevation myocardial infarction (STEMI) of inferior wall ([place]) 07/07/2011    Occluded RCA 3.82N05 INTEGRITY; Echo 07/2013: EF 45-50%, Inferior & Posterolateral HK.   Marland Kitchen Unstable angina ([place])  2006; June 2015    Cx-OM - PCI 2.5 mm 13 mm Cypher DES [name], [place]) ; 07/2013: Severe ISR of both prox & Distal LAD stentS --> 2 DES stents (1 at prox edge of the proximal stent, 2nd covers the entire distal stent as well as proximal and distal edge stenose)   . COPD (chronic obstructive pulmonary disease) ([place])   . Former heavy tobacco smoker      quit in May 2015 after multiple attempts at trying to quit before   . Glucose intolerance (pre-diabetes) June 2015     hemoglobin A1c 6.6  . Metabolic syndrome     Pre-diabetes, hypertension and truncal obesity as well as dyslipidemia  . Upper respiratory infection   . Heart murmur   . Pneumonia "several times"  . Chronic bronchitis ([place]) "used to get it q yr"  . OSA on CPAP   . GERD (gastroesophageal reflux disease)   . History of stomach ulcers   . Headache     "Imdur related; stopped taking it; headaches went  away" (11/15/2013)  . Seizures ([place]) "several"    "last one was ~ 2011" (11/15/2013)  . Shortness of breath dyspnea   . Elevated WBC count   . Emphysema of lung ([place])   . Umbilical hernia    Past Surgical History  Procedure Laterality Date  . Nm myoview ([facility] hx)  11/30/2011    EF 45% ,exercise 10 METS, infarct\scar w mild perinfarct ischemia -basal inferolat and mid inferolat region  . Met/cpet  11/09/2011    submax. effort 1.05 RER peak V02 was 51% ,chronotropic incomp.hrt rate lows 80s to 100  . Transthoracic echocardiogram  07/31/2013; February 2017    a. LVEF 45-50. Inferior posterior hypokinesis with mild  dilation. Apparent normal diastolic pressures;   . Right heart cath  June 2015    Normal pressures with severely reduced cardiac output and index. (3.25/1.55)  . Left heart catheterization with coronary angiogram N/A 07/07/2011    Procedure: LEFT HEART CATHETERIZATION WITH CORONARY ANGIOGRAM;  Surgeon: Leonie Man, MD;  Location: [facility] CATH LAB;  Service: Cardiovascular;  inferolat post LV infarct ST-elevation MI  . Percutaneous coronary stent intervention (pci-s)  07/07/2011    Procedure: PERCUTANEOUS CORONARY STENT INTERVENTION (PCI-S);  Surgeon: Leonie Man, MD;  Location: [facility] CATH LAB;  Service: Cardiovascular;;occluded RCA ,Integrity Resolute DES 2.75 x 18 mm stent - post-dilated 3.1 mm  . Left and right heart catheterization with coronary angiogram N/A 07/30/2013    Procedure: LEFT AND RIGHT HEART CATHETERIZATION WITH CORONARY ANGIOGRAM;  Surgeon: Leonie Man, MD;  Location: [facility] CATH LAB;  Service: Cardiovascular;  2 separate P & mLAD lesions -> PCI, MOd-Severe dRCA disesase with diffuse RPL/PDA disease (FFR)  . Percutaneous coronary stent intervention (pci-s)  07/30/2013    Procedure: PERCUTANEOUS CORONARY STENT INTERVENTION (PCI-S);  Surgeon: Leonie Man, MD;  Location: [facility] CATH LAB;  Service: Cardiovascular;;Distal mid LAD: Xience Alpine DES 2.25 mm x 28 mm (overlapping proximal and distal edge of previous stent) - 2.7 mm; proximal LAD 2.5 mm x 12 mm Xience Alpine DES (2.8 mm);; RCA 60-70% stenosis planned staged procedure  . Left heart cath  08/03/2013    Procedure: LEFT HEART CATH;  Surgeon: Leonie Man, MD;  Location: [facility] CATH LAB;  Service: Cardiovascular;;FFR of RCA - non-flow-limiting  . Percutaneous coronary stent intervention (pci-s) N/A 11/15/2013    Procedure: PERCUTANEOUS CORONARY STENT INTERVENTION (PCI-S);  Surgeon: Leonie Man, MD;  Location: [facility] CATH LAB;  Service: Cardiovascular;  Patent Cx & LAD Stents; PCI -dRCA Promus Premier DES 4.0 mm x 16 mm (4.25 mm) dRCA, 2.0  mm Cutting PTCA of RPL2 [place] & POBA of mRPDA  . Coronary angioplasty with stent placement  2001    ANTERIOR mi with a  PCI to LAD; [place], [place] - Dr. Dorris Fetch  . Coronary angioplasty with stent placement  2006    [place] by Dr Dorris Fetch -lesion lft circ/OM1 with 2.5 x 40m Cypher DES  . Colonoscopy with propofol N/A 03/28/2015    Procedure: COLONOSCOPY WITH PROPOFOL;  Surgeon: DLucilla Lame MD;  [facility]  Service: Endoscopy;  Laterality: N/A;  . Esophagogastroduodenoscopy (egd) with propofol N/A 03/28/2015    Procedure: ESOPHAGOGASTRODUODENOSCOPY (EGD) WITH PROPOFOL;  Surgeon: DLucilla Lame MD;  [place] [facility]  Service: Endoscopy;  Laterality: N/A;  . Polypectomy  03/28/2015    Procedure: POLYPECTOMY;  Surgeon: DLucilla Lame MD;  [place] [facility]  Service: Endoscopy;;  . Cardiac mri [facility]  February 2017  EF 51%. Mod HK of basal-mid Inf wall, mild HK of basal inferoseptum & basal-mid inferolateral wall with hyper enhancement (c/w subendocard RCA MI with significant viability), focal hyper enhancement of apical wall c/w dLAD subendocard MI & complete LAD viability. . No aortic stenosis. Normal RV function. No Ischemia on Adenosine Stress.  . Met/cpet  September 2016    [facility]: Mild functional impairment due primarily to mild pulmonary and circulatory limitations. Also suggest physical deconditioning (seen with low-normal aerobic reserve). Mild VQ mismatch with exercise.  Ventilatory reserve exhausted with peak exercise demonstrated pulmonary limitation. No significant decrease in postexercise FEV1 compared to rest --> suggest no exercise induced bronchospasm  . Umbilical hernia repair N/A 04/02/2015    Procedure: HERNIA REPAIR UMBILICAL ADULT;  Surgeon: Florene Glen, MD;  Location: [facility] ORS;  Service: General;  Laterality: N/A;  . Insertion of mesh N/A 04/02/2015    Procedure: INSERTION OF MESH;  Surgeon: Florene Glen, MD;  Location: [facility] ORS;   Service: General;  Laterality: N/A;   Family History  Problem Relation Age of Onset  . Coronary artery disease Father   . Heart disease Father   . Stroke Father   . Hypertension Father   . Hyperlipidemia Mother   . Diabetes Mother   . Hyperlipidemia Maternal Grandmother   . Diabetes Maternal Grandmother   . Hyperlipidemia Maternal Grandfather   . Diabetes Maternal Grandfather   . Emphysema Maternal Grandfather   . Heart disease Paternal Grandmother   . Heart disease Paternal Grandfather   . Cancer Maternal Aunt     breast   Social History  Substance Use Topics  . Smoking status: Current Every Day Smoker -- 1.00 packs/day for 40 years    Types: Cigarettes    Last Attempt to Quit: 06/19/2013  . Smokeless tobacco: Never Used     Comment: Quit with Bupropion '150mg'$   . Alcohol Use: 0.6 oz/week    1 Cans of beer per week     Comment: rare    Review of Systems  Constitutional: Negative for fever and diaphoresis.  Eyes: Negative for redness.  Respiratory: Negative for cough and shortness of breath.   Cardiovascular: Positive for chest pain. Negative for palpitations and leg swelling.  Gastrointestinal: Negative for nausea, vomiting and abdominal pain.  Genitourinary: Negative for dysuria.  Musculoskeletal: Negative for back pain and neck pain.  Skin: Negative for rash.  Neurological: Negative for syncope and light-headedness.  Psychiatric/Behavioral: The patient is not nervous/anxious.     Allergies  Niacin and related  Home Medications   Prior to Admission medications   Medication Sig Start Date End Date Taking? Authorizing Provider  albuterol (PROVENTIL HFA) 108 (90 Base) MCG/ACT inhaler Inhale 1 puff into the lungs every 6 (six) hours as needed. 06/24/15   Burnard Hawthorne, FNP  aspirin EC 81 MG tablet Take 162 mg by mouth at bedtime.    Historical Provider, MD  azelastine (OPTIVAR) 0.05 % ophthalmic solution Place 1 drop into both eyes daily. Reported on 03/14/2015  03/06/15   Historical Provider, MD  azithromycin (ZITHROMAX) 250 MG tablet 2 pills on day 1, then 1 pill daily for 4 days 07/04/15   Chesley Mires, MD  benzonatate (TESSALON) 100 MG capsule Take 1 capsule (100 mg total) by mouth 3 (three) times daily as needed for cough. 06/24/15   Burnard Hawthorne, FNP  buPROPion (WELLBUTRIN XL) 150 MG 24 hr tablet Take 150 mg by mouth daily.    Historical Provider, MD  clobetasol cream (TEMOVATE) 4.09 % APPLY 1 APPLICATION TOPICALLY 2 (TWO) TIMES DAILY. 12/10/14   Jearld Fenton, NP  EFFIENT 10 MG TABS tablet TAKE 1 TABLET (10 MG TOTAL) BY MOUTH DAILY. 07/14/15   Leonie Man, MD  fluticasone [facility]) 50 MCG/ACT nasal spray Place 2 sprays into both nostrils daily. 03/06/15   Historical Provider, MD  isosorbide mononitrate (IMDUR) 30 MG 24 hr tablet Take 1 tablet (30 mg total) by mouth daily. 06/04/15   Leonie Man, MD  losartan (COZAAR) 25 MG tablet TAKE 1 TABLET (25 MG TOTAL) BY MOUTH DAILY. 06/04/15   Leonie Man, MD  metoprolol succinate (TOPROL-XL) 50 MG 24 hr tablet Take 1 tablet (50 mg total) by mouth daily. Take with or immediately following a meal. 06/16/15   Leonie Man, MD  niacin (NIASPAN) 500 MG CR tablet Take 1 tablet (500 mg total) by mouth at bedtime. Takes 30 minutes after taking aspirin. 06/04/15   Leonie Man, MD  nitroGLYCERIN (NITROSTAT) 0.4 MG SL tablet Place 1 tablet (0.4 mg total) under the tongue every 5 (five) minutes as needed for chest pain. 06/04/15   Leonie Man, MD  predniSONE (DELTASONE) 10 MG tablet Take 4 tablets ( total 40 mg) by mouth for 2 days; take 3 tablets ( total 30 mg) by mouth for 2 days; take 2 tablets ( total 20 mg) by mouth for 1 day; take 1 tablet ( total 10 mg) by mouth for 1 day. 06/24/15   Burnard Hawthorne, FNP  ranolazine (RANEXA) 500 MG 12 hr tablet Take 1 tablet (500 mg total) by mouth 2 (two) times daily. 06/04/15   Leonie Man, MD  rosuvastatin (CRESTOR) 20 MG tablet Take 1 tablet (20 mg total) by  mouth daily. 07/29/15   Leonie Man, MD   BP 113/85 mmHg  Pulse 106  Temp(Src) 98.2 F (36.8 C) (Oral)  Resp 25  Ht 6' (1.829 m)  Wt 79.379 kg  BMI 23.73 kg/m2  SpO2 96%   Physical Exam  Constitutional: He appears well-developed and well-nourished.  HENT:  Head: Normocephalic and atraumatic.  Mouth/Throat: Mucous membranes are normal. Mucous membranes are not dry.  Eyes: Conjunctivae are normal. Right eye exhibits no discharge. Left eye exhibits no discharge.  Neck: Trachea normal and normal range of motion. Neck supple. Normal carotid pulses and no JVD present. No muscular tenderness present. Carotid bruit is not present. No tracheal deviation present.  Cardiovascular: Normal rate, regular rhythm, S1 normal, S2 normal, normal heart sounds and intact distal pulses.  Exam reveals no distant heart sounds and no decreased pulses.   No murmur heard. Pulmonary/Chest: Effort normal and breath sounds normal. No respiratory distress. He has no wheezes. He exhibits no tenderness.  Abdominal: Soft. Normal aorta and bowel sounds are normal. There is no tenderness. There is no rebound and no guarding.  Musculoskeletal: He exhibits no edema.  Neurological: He is alert.  Skin: Skin is warm and dry. He is not diaphoretic. No cyanosis. No pallor.  Psychiatric: He has a normal mood and affect.  Nursing note and vitals reviewed.   ED Course  Procedures (including critical care time) Labs Review Labs Reviewed  BASIC METABOLIC PANEL - Abnormal; Notable for the following:    Glucose, Bld 119 (*)    Calcium 8.7 (*)    All other components within normal limits  CBC - Abnormal; Notable for the following:    WBC 13.5 (*)    Hemoglobin  17.3 (*)    All other components within normal limits  PROTIME-INR  TROPONIN I  Randolm Idol, ED    Imaging Review Dg Chest 2 View  08/29/2015  CLINICAL DATA:  Chest pain, nausea tonight. History of heart attack, COPD. EXAM: CHEST  2 VIEW COMPARISON:   Chest radiograph March 14, 2015 FINDINGS: Cardiomediastinal silhouette is unremarkable unchanged. Coronary artery calcification versus stent. Mild chronic bronchitic changes increased lung volumes compatible with COPD with bibasilar strandy densities. No pleural effusion or focal consolidation. Mild biapical pleural thickening. No pneumothorax. Mild degenerative change of the thoracic spine. IMPRESSION: COPD, no superimposed acute cardiopulmonary process. Electronically Signed   By: Elon Alas M.D.   On: 08/29/2015 04:07   I have personally reviewed and evaluated these images and lab results as part of my medical decision-making.  4:04 AM Patient seen and examined. EKG reviewed. Work-up initiated. Discussed with Dr. Christy Gentles.   Vital signs reviewed and are as follows: BP 113/85 mmHg  Pulse 106  Temp(Src) 98.2 F (36.8 C) (Oral)  Resp 25  Ht 6' (1.829 m)  Wt 79.379 kg  BMI 23.73 kg/m2  SpO2 96%   Spoke with cardiology fellow who will see and admit. First trop negative.   MDM   Final diagnoses:  Chest pain, unspecified chest pain type   Admit for CP obs.    Carlisle Cater, PA-C 08/29/15 0600  Ripley Fraise, MD 08/29/15 [phone]

## 2015-08-29 NOTE — Progress Notes (Signed)
TELEMETRY: Reviewed telemetry pt in NSR: Filed Vitals:   08/29/15 0745 08/29/15 0830 08/29/15 0900 08/29/15 0915  BP: 110/83 111/83 109/86 122/73  Pulse: 81 77 83 96  Temp:      TempSrc:      Resp: 20 17 18 21   Height:      Weight:      SpO2: 96% 94% 94% 96%   No intake or output data in the 24 hours ending 08/29/15 0939 Filed Weights   08/29/15 0329  Weight: 175 lb (79.379 kg)    Subjective Currently without complaints. Admitted last night. Was sitting outside when he broke out in cold sweat with nausea. Left arm numbness and hand tingling. Stuttering speech. No difficulty with word finding or slurring. Just some hesitation. Then had chest tightness.   Marland Kitchen aspirin EC  162 mg Oral QHS  . isosorbide mononitrate  30 mg Oral Daily  . ketotifen  1 drop Both Eyes BID  . losartan  25 mg Oral Daily  . metoprolol succinate  50 mg Oral Daily  . prasugrel  10 mg Oral Daily  . ranolazine  500 mg Oral BID  . rosuvastatin  20 mg Oral Daily      LABS: Basic Metabolic Panel:  Recent Labs  08/29/15 0345  NA 138  K 3.7  CL 106  CO2 24  GLUCOSE 119*  BUN 11  CREATININE 1.18  CALCIUM 8.7*   Liver Function Tests: No results for input(s): AST, ALT, ALKPHOS, BILITOT, PROT, ALBUMIN in the last 72 hours. No results for input(s): LIPASE, AMYLASE in the last 72 hours. CBC:  Recent Labs  08/29/15 0345  WBC 13.5*  HGB 17.3*  HCT 51.9  MCV 89.9  PLT 350   Cardiac Enzymes:  Recent Labs  08/29/15 0803  TROPONINI <0.03   BNP: No results for input(s): PROBNP in the last 72 hours. D-Dimer: No results for input(s): DDIMER in the last 72 hours. Hemoglobin A1C: No results for input(s): HGBA1C in the last 72 hours. Fasting Lipid Panel: No results for input(s): CHOL, HDL, LDLCALC, TRIG, CHOLHDL, LDLDIRECT in the last 72 hours. Thyroid Function Tests: No results for input(s): TSH, T4TOTAL, T3FREE, THYROIDAB in the last 72 hours.  Invalid input(s):  FREET3   Radiology/Studies:  Dg Chest 2 View  08/29/2015  CLINICAL DATA:  Chest pain, nausea tonight. History of heart attack, COPD. EXAM: CHEST  2 VIEW COMPARISON:  Chest radiograph March 14, 2015 FINDINGS: Cardiomediastinal silhouette is unremarkable unchanged. Coronary artery calcification versus stent. Mild chronic bronchitic changes increased lung volumes compatible with COPD with bibasilar strandy densities. No pleural effusion or focal consolidation. Mild biapical pleural thickening. No pneumothorax. Mild degenerative change of the thoracic spine. IMPRESSION: COPD, no superimposed acute cardiopulmonary process. Electronically Signed   By: Elon Alas M.D.   On: 08/29/2015 04:07   Ecg NSR with no acute change.   PHYSICAL EXAM General: Well developed, well nourished, in no acute distress. Head: Normocephalic, atraumatic, sclera non-icteric, oropharynx is clear Neck: Negative for carotid bruits. JVD not elevated. No adenopathy Lungs: Clear bilaterally to auscultation without wheezes, rales, or rhonchi. Breathing is unlabored. Heart: RRR S1 S2 without murmurs, rubs, or gallops.  Abdomen: Soft, non-tender, non-distended with normoactive bowel sounds. No hepatomegaly. No rebound/guarding. No obvious abdominal masses. Msk:  Strength and tone appears normal for age. Extremities: No clubbing, cyanosis or edema.  Distal pedal pulses are 2+ and equal bilaterally. Neuro: Alert and oriented X 3. Moves all extremities spontaneously. Psych:  Responds to questions appropriately with a normal affect.  ASSESSMENT AND PLAN: 1. Acute diaphoresis with nausea, chest tightness, hesitant speech. Appears neurologically intact. Troponin negative x 2. No Ecg changes. Patient informs me that he hasn't taken any of his medications for 2 months. No real explanation- just quit taking them. Will resume cardiac meds. Monitor BP. Serial enzymes. Anticipate DC in am if he rules out. No further testing indicated  unless taking his meds.  2. CAD s/p multiple MIs and prior PCIs. Resume DAPT 3. HTN controlled 4. HLD resume statin 5. COPD  Present on Admission:  . Chest pain  Signed, Leeum Sankey [place], [place] 08/29/2015 9:39 AM

## 2015-08-29 NOTE — ED Notes (Signed)
Report attempted, RN not available at this time.

## 2015-08-29 NOTE — Progress Notes (Signed)
Initial Nutrition Assessment  DOCUMENTATION CODES:   Non-severe (moderate) malnutrition in context of chronic illness  INTERVENTION:    Ensure Enlive po BID, each supplement provides 350 kcal and 20 grams of protein  NUTRITION DIAGNOSIS:   Malnutrition related to chronic illness as evidenced by mild depletion of body fat, mild depletion of muscle mass, percent weight loss (5% weight loss in one month).  GOAL:   Patient will meet greater than or equal to 90% of their needs  MONITOR:   PO intake, Supplement acceptance, Labs, Weight trends, I & O's  REASON FOR ASSESSMENT:   Malnutrition Screening Tool    ASSESSMENT:   55 y o man with PMH of CAD s/p PCI, HFpEF? OSA, smoking. He reports new acute onset of CP today at rest.   Patient reports that he has been eating one meal per day for the past month and has lost ~10 lbs, usual weight 185 lbs. 5% weight loss within the past month. Nutrition-Focused physical exam completed. Findings are mild-moderate fat depletion, mild-moderate muscle depletion, and no edema.  He agreed to drink Ensure BID, encouraged to continue at home as well.  Diet Order:  Diet Heart Room service appropriate?: Yes; Fluid consistency:: Thin  Skin:  Reviewed, no issues  Last BM:  7/21  Height:   Ht Readings from Last 1 Encounters:  08/29/15 6' (1.829 m)    Weight:   Wt Readings from Last 1 Encounters:  08/29/15 175 lb (79.379 kg)    Ideal Body Weight:  80.9 kg  BMI:  Body mass index is 23.73 kg/(m^2).  Estimated Nutritional Needs:   Kcal:  2000-2200  Protein:  100-115 gm  Fluid:  2-2.2 L  EDUCATION NEEDS:   No education needs identified at this time  Molli Barrows, [place], [place], [place] Pager [phone] After Hours Pager [phone]

## 2015-08-29 NOTE — ED Notes (Signed)
Pt coming from home per EMS. Developed new onset chest pain/pressure/tightness with radiating pain/numbness to L arm, beginning approx 0230 this AM. Hx HTN, COPD, 8 stents (last one 11/2014). Pt took 162mg  ASA at home, given 162mg  ASA by EMS. Pain resolved with EMS en route.

## 2015-08-30 DIAGNOSIS — E785 Hyperlipidemia, unspecified: Secondary | ICD-10-CM | POA: Diagnosis not present

## 2015-08-30 DIAGNOSIS — Z9114 Patient's other noncompliance with medication regimen: Secondary | ICD-10-CM

## 2015-08-30 DIAGNOSIS — R079 Chest pain, unspecified: Secondary | ICD-10-CM | POA: Diagnosis not present

## 2015-08-30 DIAGNOSIS — I25118 Atherosclerotic heart disease of native coronary artery with other forms of angina pectoris: Secondary | ICD-10-CM

## 2015-08-30 DIAGNOSIS — Z955 Presence of coronary angioplasty implant and graft: Secondary | ICD-10-CM

## 2015-08-30 DIAGNOSIS — R0789 Other chest pain: Secondary | ICD-10-CM | POA: Diagnosis not present

## 2015-08-30 DIAGNOSIS — I2510 Atherosclerotic heart disease of native coronary artery without angina pectoris: Secondary | ICD-10-CM | POA: Diagnosis not present

## 2015-08-30 DIAGNOSIS — I10 Essential (primary) hypertension: Secondary | ICD-10-CM

## 2015-08-30 MED ORDER — METOPROLOL SUCCINATE ER 50 MG PO TB24: 50.0000 mg | ORAL_TABLET | Freq: Every day | ORAL | Status: DC

## 2015-08-30 MED ORDER — ASPIRIN 81 MG PO TBEC: 162.0000 mg | DELAYED_RELEASE_TABLET | Freq: Every day | ORAL | Status: DC

## 2015-08-30 MED ORDER — LOSARTAN POTASSIUM 25 MG PO TABS: 25.0000 mg | ORAL_TABLET | Freq: Every day | ORAL | Status: DC

## 2015-08-30 MED ORDER — PRASUGREL HCL 10 MG PO TABS: 10.0000 mg | ORAL_TABLET | Freq: Every day | ORAL | Status: DC

## 2015-08-30 MED ORDER — RANOLAZINE ER 500 MG PO TB12: 500.0000 mg | ORAL_TABLET | Freq: Two times a day (BID) | ORAL | Status: DC

## 2015-08-30 MED ORDER — ISOSORBIDE MONONITRATE ER 30 MG PO TB24: 30.0000 mg | ORAL_TABLET | Freq: Every day | ORAL | Status: DC

## 2015-08-30 MED ORDER — ROSUVASTATIN CALCIUM 20 MG PO TABS: 20.0000 mg | ORAL_TABLET | Freq: Every day | ORAL | Status: DC

## 2015-08-30 NOTE — Discharge Summary (Signed)
Discharge Summary    Patient ID: Troy Parrish,  MRN: [id number], DOB/AGE: 03-04-1960 55 y.o.  Admit date: 08/29/2015 Discharge date: 08/30/2015  Primary Care Provider: Webb Silversmith Primary Cardiologist: Dr. Ellyn Hack  Discharge Diagnoses    Active Problems:   Chest pain   Allergies Allergies  Allergen Reactions  . Niacin And Related Hives and Itching    Diagnostic Studies/Procedures  None  _____________   History of Present Illness   Troy Parrish is a 55 year old male with a past medical history of CAD, obstructive sleep apnea, and COPD. he had a STEMI in 2001 and in 2013. His last heart cath was in October 2015, at that time he had widely patent stents to his LAD(both sites at previously been intervened upon in June 2015 for in-stent restenosis), patent stent to proximal RCA, and widely patent circumflex system. He received a drug-eluting stent to his distal RCA and angioplasty of the ostium of the RPL tube and balloon angioplasty of the mid RPDA.  He presented to the emergency room on 08/29/2015 with complaints of chest pain and pressure with radiation and numbness to his left arm. His pain was resolved with the administration of 162 mg of aspirin.  Hospital Course  He was admitted for further observation. His troponin was negative 3. EKG showed no acute ST changes. The patient says that he hasn't taken any of his medications for 2 months. He didn't have a real explanation for that he just quit taking them.  His initial episode of chest pain was associated with "hesitant speech". No difficulty word finding or slurred speech. He was neurologically intact throughout admission. No ischemic evaluation was indicated. He was chest pain-free throughout admission.  His dual antiplatelet therapy was resumed with Effient and 162 mg of aspirin. He will also continue on long-acting nitrates. Ranexa was added for added extra anginal relief. His blood pressure is well controlled with  losartan, and he is on long-acting metoprolol. His renal function is within normal limits.  We will follow-up with the patient in 2-3 weeks. _____________  Discharge Vitals Blood pressure 111/74, pulse 88, temperature 98 F (36.7 C), temperature source Oral, resp. rate 17, height 6' (1.829 m), weight 179 lb 14.4 oz (81.602 kg), SpO2 98 %.  Filed Weights   08/29/15 0329 08/30/15 0500  Weight: 175 lb (79.379 kg) 179 lb 14.4 oz (81.602 kg)    Labs & Radiologic Studies     CBC  Recent Labs  08/29/15 0345  WBC 13.5*  HGB 17.3*  HCT 51.9  MCV 89.9  PLT [id number]   Basic Metabolic Panel  Recent Labs  08/29/15 0345  NA 138  K 3.7  CL 106  CO2 24  GLUCOSE 119*  BUN 11  CREATININE 1.18  CALCIUM 8.7*   Cardiac Enzymes  Recent Labs  08/29/15 0803 08/29/15 1109 08/29/15 1435  TROPONINI <0.03 <0.03 <0.03    Dg Chest 2 View  08/29/2015  CLINICAL DATA:  Chest pain, nausea tonight. History of heart attack, COPD. EXAM: CHEST  2 VIEW COMPARISON:  Chest radiograph March 14, 2015 FINDINGS: Cardiomediastinal silhouette is unremarkable unchanged. Coronary artery calcification versus stent. Mild chronic bronchitic changes increased lung volumes compatible with COPD with bibasilar strandy densities. No pleural effusion or focal consolidation. Mild biapical pleural thickening. No pneumothorax. Mild degenerative change of the thoracic spine. IMPRESSION: COPD, no superimposed acute cardiopulmonary process. Electronically Signed   By: Elon Alas M.D.   On: 08/29/2015 04:07  Disposition   Pt is being discharged home today in good condition.  Follow-up Plans & Appointments  Office will call to schedule an appointment in 2-3 weeks   Discharge Instructions    Diet - low sodium heart healthy    Complete by:  As directed      Increase activity slowly    Complete by:  As directed            Discharge Medications   Current Discharge Medication List    CONTINUE these  medications which have CHANGED   Details  aspirin EC 81 MG EC tablet Take 2 tablets (162 mg total) by mouth at bedtime.    isosorbide mononitrate (IMDUR) 30 MG 24 hr tablet Take 1 tablet (30 mg total) by mouth daily. Qty: 30 tablet, Refills: 12    losartan (COZAAR) 25 MG tablet Take 1 tablet (25 mg total) by mouth daily. Qty: 30 tablet, Refills: 12    metoprolol succinate (TOPROL-XL) 50 MG 24 hr tablet Take 1 tablet (50 mg total) by mouth daily. Take with or immediately following a meal. Qty: 30 tablet, Refills: 12    prasugrel (EFFIENT) 10 MG TABS tablet Take 1 tablet (10 mg total) by mouth daily. Qty: 30 tablet, Refills: 12    ranolazine (RANEXA) 500 MG 12 hr tablet Take 1 tablet (500 mg total) by mouth 2 (two) times daily. Qty: 60 tablet, Refills: 12    rosuvastatin (CRESTOR) 20 MG tablet Take 1 tablet (20 mg total) by mouth daily. Qty: 30 tablet, Refills: 12      CONTINUE these medications which have NOT CHANGED   Details  albuterol (PROVENTIL HFA) 108 (90 Base) MCG/ACT inhaler Inhale 1 puff into the lungs every 6 (six) hours as needed. Qty: 1 Inhaler, Refills: 2   Associated Diagnoses: Chronic obstructive pulmonary disease, unspecified COPD, unspecified chronic bronchitis type    clobetasol cream (TEMOVATE) [id number] % APPLY 1 APPLICATION TOPICALLY 2 (TWO) TIMES DAILY. Qty: 30 g, Refills: 2    nitroGLYCERIN (NITROSTAT) 0.4 MG SL tablet Place 1 tablet (0.4 mg total) under the tongue every 5 (five) minutes as needed for chest pain. Qty: 25 tablet, Refills: 3      STOP taking these medications     azelastine (OPTIVAR) 0.05 % ophthalmic solution      benzonatate (TESSALON) 100 MG capsule             Outstanding Labs/Studies  BMP, lipids  Duration of Discharge Encounter   Greater than 30 minutes including physician time.  Signed, Arbutus Leas NP 08/30/2015, 9:50 AM

## 2015-08-30 NOTE — Plan of Care (Signed)
Problem: Safety: Goal: Ability to remain free from injury will improve Outcome: Progressing Patient instructed to call for assistance and to use his call light or call RN/NT on the phone with numbers being on the white board and patient verbalized understanding. Patient has all personal items within reach, denies pain and his wife is in the room with him to provide support, will continue to monitor.

## 2015-08-30 NOTE — Plan of Care (Signed)
Problem: Pain Managment: Goal: General experience of comfort will improve Outcome: Progressing Patient is denying pain but knows how to call if he needs anything for pain, will continue to monitor.

## 2015-08-30 NOTE — Progress Notes (Signed)
SUBJECTIVE: Feeling well. Denies chest pain. Ready to go home.   ROS: Other than pertinent positives in "Subjective", all others were reviewed and found to be negative.   Intake/Output Summary (Last 24 hours) at 08/30/15 0839 Last data filed at 08/30/15 0500  Gross per 24 hour  Intake    960 ml  Output    400 ml  Net    560 ml    Current Facility-Administered Medications  Medication Dose Route Frequency Provider Last Rate Last Dose  . acetaminophen (TYLENOL) tablet 650 mg  650 mg Oral Q6H PRN Almyra Deforest, PA   650 mg at 08/29/15 1510  . albuterol (PROVENTIL) (2.5 MG/3ML) 0.083% nebulizer solution 2.5 mg  2.5 mg Nebulization Q6H PRN Peter M Martinique, MD      . aspirin EC tablet 162 mg  162 mg Oral QHS Cristina Gong, MD   162 mg at 08/29/15 2232  . benzonatate (TESSALON) capsule 100 mg  100 mg Oral TID PRN Cristina Gong, MD      . feeding supplement (ENSURE ENLIVE) (ENSURE ENLIVE) liquid 237 mL  237 mL Oral BID BM Peter M Martinique, MD      . isosorbide mononitrate (IMDUR) 24 hr tablet 30 mg  30 mg Oral Daily Marat Fudim, MD   30 mg at 08/29/15 1042  . ketotifen (ZADITOR) 0.025 % ophthalmic solution 1 drop  1 drop Both Eyes BID Cristina Gong, MD   1 drop at 08/29/15 2232  . losartan (COZAAR) tablet 25 mg  25 mg Oral Daily Marat Fudim, MD   25 mg at 08/29/15 1042  . metoprolol succinate (TOPROL-XL) 24 hr tablet 50 mg  50 mg Oral Daily Marat Fudim, MD   50 mg at 08/29/15 1042  . nitroGLYCERIN (NITROSTAT) SL tablet 0.4 mg  0.4 mg Sublingual Q5 min PRN Marat Fudim, MD      . prasugrel (EFFIENT) tablet 10 mg  10 mg Oral Daily Marat Fudim, MD   10 mg at 08/29/15 1041  . ranolazine (RANEXA) 12 hr tablet 500 mg  500 mg Oral BID Cristina Gong, MD   500 mg at 08/29/15 2232  . rosuvastatin (CRESTOR) tablet 20 mg  20 mg Oral Daily Marat Fudim, MD   20 mg at 08/29/15 1042    Filed Vitals:   08/30/15 0145 08/30/15 0500 08/30/15 0503 08/30/15 0506  BP: 87/65 95/63 112/75 111/74  Pulse: 68 64 77 88    Temp:  98 F (36.7 C)    TempSrc:      Resp: 19 17    Height:      Weight:  179 lb 14.4 oz (81.602 kg)    SpO2: 96% 98%      PHYSICAL EXAM General: NAD HEENT: Normal. Neck: No JVD, no thyromegaly.  Lungs: Clear to auscultation bilaterally with normal respiratory effort. CV: Nondisplaced PMI.  Regular rate and rhythm, normal S1/S2, no S3/S4, no murmur.  No pretibial edema.  No carotid bruit.  Normal pedal pulses.  Abdomen: Soft, nontender, no distention.  Neurologic: Alert and oriented x 3.  Psych: Normal affect. Musculoskeletal: Normal range of motion. No gross deformities. Extremities: No clubbing or cyanosis.   TELEMETRY: Reviewed telemetry pt in NSR.  LABS: Basic Metabolic Panel:  Recent Labs  08/29/15 0345  NA 138  K 3.7  CL 106  CO2 24  GLUCOSE 119*  BUN 11  CREATININE 1.18  CALCIUM 8.7*   Liver Function Tests: No results for input(s): AST,  ALT, ALKPHOS, BILITOT, PROT, ALBUMIN in the last 72 hours. No results for input(s): LIPASE, AMYLASE in the last 72 hours. CBC:  Recent Labs  08/29/15 0345  WBC 13.5*  HGB 17.3*  HCT 51.9  MCV 89.9  PLT 350   Cardiac Enzymes:  Recent Labs  08/29/15 0803 08/29/15 1109 08/29/15 1435  TROPONINI <0.03 <0.03 <0.03   BNP: Invalid input(s): POCBNP D-Dimer: No results for input(s): DDIMER in the last 72 hours. Hemoglobin A1C: No results for input(s): HGBA1C in the last 72 hours. Fasting Lipid Panel: No results for input(s): CHOL, HDL, LDLCALC, TRIG, CHOLHDL, LDLDIRECT in the last 72 hours. Thyroid Function Tests: No results for input(s): TSH, T4TOTAL, T3FREE, THYROIDAB in the last 72 hours.  Invalid input(s): FREET3 Anemia Panel: No results for input(s): VITAMINB12, FOLATE, FERRITIN, TIBC, IRON, RETICCTPCT in the last 72 hours.  RADIOLOGY: Dg Chest 2 View  08/29/2015  CLINICAL DATA:  Chest pain, nausea tonight. History of heart attack, COPD. EXAM: CHEST  2 VIEW COMPARISON:  Chest radiograph March 14, 2015 FINDINGS: Cardiomediastinal silhouette is unremarkable unchanged. Coronary artery calcification versus stent. Mild chronic bronchitic changes increased lung volumes compatible with COPD with bibasilar strandy densities. No pleural effusion or focal consolidation. Mild biapical pleural thickening. No pneumothorax. Mild degenerative change of the thoracic spine. IMPRESSION: COPD, no superimposed acute cardiopulmonary process. Electronically Signed   By: Elon Alas M.D.   On: 08/29/2015 04:07      ASSESSMENT AND PLAN: 1. Acute diaphoresis with nausea, chest tightness, hesitant speech. Appears neurologically intact. Troponins all negative. No Ecg changes. Patient informed us that he hasn't taken any of his medications for 2 months. No real explanation- just quit taking them. Continue cardiac meds. No further testing indicated unless taking his meds.   2. CAD s/p multiple MIs and prior PCIs. Continue DAPT.  3. Essential HTN: Controlled. No changes.  4. HLD: Continue statin.  Dispo: DC to home.  Kate Sable, M.D., F.A.C.C.

## 2015-08-30 NOTE — Progress Notes (Signed)
Report received via Denice Paradise RN in patient's room using SBAR format, reviewed orders, labs, VS, meds, tests and patient's general condition, assumed care of patient.

## 2015-08-30 NOTE — Progress Notes (Signed)
Discharge planning and teaching reviewed with pt. Pt and wife have no further questions at this time. Note, excusing pt and family from work, given to pt. VSS. Discharging home via family.

## 2015-09-01 ENCOUNTER — Ambulatory Visit: Payer: 59 | Admitting: Internal Medicine

## 2015-09-03 ENCOUNTER — Encounter: Payer: Self-pay | Admitting: Internal Medicine

## 2015-09-03 ENCOUNTER — Ambulatory Visit (INDEPENDENT_AMBULATORY_CARE_PROVIDER_SITE_OTHER): Payer: 59 | Admitting: Internal Medicine

## 2015-09-03 VITALS — BP 112/72 | HR 106 | Temp 98.5°F | Wt 174.0 lb

## 2015-09-03 DIAGNOSIS — I2510 Atherosclerotic heart disease of native coronary artery without angina pectoris: Secondary | ICD-10-CM

## 2015-09-03 DIAGNOSIS — Z9861 Coronary angioplasty status: Secondary | ICD-10-CM

## 2015-09-03 DIAGNOSIS — K2100 Gastro-esophageal reflux disease with esophagitis, without bleeding: Secondary | ICD-10-CM

## 2015-09-03 DIAGNOSIS — E119 Type 2 diabetes mellitus without complications: Secondary | ICD-10-CM | POA: Diagnosis not present

## 2015-09-03 DIAGNOSIS — E785 Hyperlipidemia, unspecified: Secondary | ICD-10-CM

## 2015-09-03 DIAGNOSIS — G4733 Obstructive sleep apnea (adult) (pediatric): Secondary | ICD-10-CM

## 2015-09-03 DIAGNOSIS — I5042 Chronic combined systolic (congestive) and diastolic (congestive) heart failure: Secondary | ICD-10-CM | POA: Diagnosis not present

## 2015-09-03 DIAGNOSIS — J449 Chronic obstructive pulmonary disease, unspecified: Secondary | ICD-10-CM

## 2015-09-03 DIAGNOSIS — I10 Essential (primary) hypertension: Secondary | ICD-10-CM | POA: Diagnosis not present

## 2015-09-03 MED ORDER — CLOBETASOL PROPIONATE 0.05 % EX CREA: TOPICAL_CREAM | CUTANEOUS | 2 refills | Status: DC

## 2015-09-03 MED ORDER — OMEPRAZOLE 20 MG PO CPDR: 20.0000 mg | DELAYED_RELEASE_CAPSULE | Freq: Every day | ORAL | 3 refills | Status: DC

## 2015-09-03 NOTE — Assessment & Plan Note (Signed)
Chronic SOB but not worse He is not interested in restarting ICS Continue Albuterol for now

## 2015-09-03 NOTE — Patient Instructions (Signed)

## 2015-09-03 NOTE — Assessment & Plan Note (Signed)
He refuses to wear CPAP machine Will monitor

## 2015-09-03 NOTE — Assessment & Plan Note (Signed)
?   BP running on the low side He will discuss this with his cardiologist

## 2015-09-03 NOTE — Assessment & Plan Note (Signed)
Diet controlled A1C today No microalbumin needed secondary to ACEI/ARB therapy Encouraged him to get an eye exam Foot exam today Encouraged him to consume a low fat, low carb diet Immunizations UTD

## 2015-09-03 NOTE — Assessment & Plan Note (Signed)
Deteriorated Insurance would not cover Dexilant Will give RX for Prilosec

## 2015-09-03 NOTE — Assessment & Plan Note (Signed)
Euvolemic on exam CMET today Continue current medications Will monitor

## 2015-09-03 NOTE — Assessment & Plan Note (Signed)
Recent episode of chest pain Has follow up scheduled with cardiology Continue current medications at this time

## 2015-09-03 NOTE — Progress Notes (Signed)
HPI  Pt presents to the clinic today to for 6 month follow up of chronic conditions.  HTN: BP well controlled. BP today is 112/72. He is on Cozaar and Toprol. ECG from 08/30/15 reviewed.  HLD: Last lipid profile 02/2015 reviewed. LDL was 138. He has only been taking Crestor for the last few weeks, stopped all his medications for 6 weeks due to insurance issues. Denies myalgias.   CAD with angina s/p stent placement: Followed by cardiology. Currently stable on Crestor, Losartan, Metoprolol, Ranexa, Imdur and Effient. He was recently seen in the ER for chest pain, no MI. He follows up cardiology next week.   Heart failure: Combined, but stable on Cozaar and Toprol.  Sleep apnea: No longer using CPAP because every morning when he wakes up, it is off. He reports he must be pulling it off in his sleep.  COPD: Follows with pulmonology. Uses Albuterol prn. He used to be on Advair but did not like it, so stopped. Quit smoking 06/2013.  DM2: He is not checking his sugars. Not medicated. Last A1C 02/2015 was 5.7%. He has cut back on his soda and sweets. He has not had an eye exam in 15 years. Flu and Prevnar UTD.    Past Medical History:  Diagnosis Date  . CAD S/P percutaneous coronary angioplasty 07/07/2011; 6 & 10 2015   S/P PCI to all 3 major vessels;a)  Ant STEMI 2001- BMS-LAD x2 -->(redo PCI 6/'15 -- pLAD Xience DES 2.5 x 12- 2.75 mm, mLAD 2.25 x 12 - 2.7 mm), b) '06 UA --> Cx- OM DES; c) 2013 Inf MI BMS mRCA --> d) 10/'15 PCI dRCA Promus P DES 4.0 x 16 (4.25 mm); PTCA of RPL2 (2.0 mm) &RPDA (2.25 mm) 11/2013  . Chronic bronchitis ([place]) "used to get it q yr"  . COPD (chronic obstructive pulmonary disease) ([place])   . Elevated WBC count   . Emphysema of lung ([place])   . Essential hypertension 07/07/2011  . Former heavy tobacco smoker     quit in May 2015 after multiple attempts at trying to quit before   . GERD (gastroesophageal reflux disease)   . Glucose intolerance (pre-diabetes) June 2015     hemoglobin A1c 6.6  . Headache    "Imdur related; stopped taking it; headaches went away" (11/15/2013)  . Heart murmur   . History of colon polyps   . History of stomach ulcers   . Hyperlipidemia with target LDL less than 70 07/07/2011  . Metabolic syndrome    Pre-diabetes, hypertension and truncal obesity as well as dyslipidemia  . OSA on CPAP   . Pneumonia "several times"  . Recurrent upper respiratory infection (URI)   . Seizures ([place]) "several"   "last one was ~ 2011" (11/15/2013)  . Shortness of breath dyspnea   . ST elevation myocardial infarction (STEMI) of anterior wall ([place]) 2001   History of -- 2 stents in early and distal mid LAD; prior cardiologist was Dr. Remi Haggard in [place]  . ST elevation myocardial infarction (STEMI) of inferior wall ([place]) 07/07/2011   Occluded RCA [id number] INTEGRITY; Echo 07/2013: EF 45-50%, Inferior & Posterolateral HK.   Marland Kitchen Umbilical hernia   . Unstable angina ([place])  2006; June 2015   Cx-OM - PCI 2.5 mm 13 mm Cypher DES [name], [place]) ; 07/2013: Severe ISR of both prox & Distal LAD stentS --> 2 DES stents (1 at prox edge of the proximal stent, 2nd covers the entire distal stent as well as  proximal and distal edge stenose)   . Upper respiratory infection     Current Outpatient Prescriptions  Medication Sig Dispense Refill  . albuterol (PROVENTIL HFA) 108 (90 Base) MCG/ACT inhaler Inhale 1 puff into the lungs every 6 (six) hours as needed. 1 Inhaler 2  . aspirin EC 81 MG EC tablet Take 2 tablets (162 mg total) by mouth at bedtime.    . clobetasol cream (TEMOVATE) [id number] % APPLY 1 APPLICATION TOPICALLY 2 (TWO) TIMES DAILY. (Patient taking differently: APPLY 1 APPLICATION TOPICALLY 2 (TWO) TIMES DAILY AS NEEDED ECEZEMA) 30 g 2  . isosorbide mononitrate (IMDUR) 30 MG 24 hr tablet Take 1 tablet (30 mg total) by mouth daily. 30 tablet 12  . losartan (COZAAR) 25 MG tablet Take 1 tablet (25 mg total) by mouth daily. 30 tablet 12  . metoprolol succinate  (TOPROL-XL) 50 MG 24 hr tablet Take 1 tablet (50 mg total) by mouth daily. Take with or immediately following a meal. 30 tablet 12  . nitroGLYCERIN (NITROSTAT) 0.4 MG SL tablet Place 1 tablet (0.4 mg total) under the tongue every 5 (five) minutes as needed for chest pain. 25 tablet 3  . prasugrel (EFFIENT) 10 MG TABS tablet Take 1 tablet (10 mg total) by mouth daily. 30 tablet 12  . ranolazine (RANEXA) 500 MG 12 hr tablet Take 1 tablet (500 mg total) by mouth 2 (two) times daily. 60 tablet 12  . rosuvastatin (CRESTOR) 20 MG tablet Take 1 tablet (20 mg total) by mouth daily. 30 tablet 12   No current facility-administered medications for this visit.     Allergies  Allergen Reactions  . Niacin And Related Hives and Itching    Family History  Problem Relation Age of Onset  . Coronary artery disease Father   . Heart disease Father   . Stroke Father   . Hypertension Father   . Hyperlipidemia Mother   . Diabetes Mother   . Hyperlipidemia Maternal Grandmother   . Diabetes Maternal Grandmother   . Hyperlipidemia Maternal Grandfather   . Diabetes Maternal Grandfather   . Emphysema Maternal Grandfather   . Heart disease Paternal Grandmother   . Heart disease Paternal Grandfather   . Cancer Maternal Aunt     breast    Social History   Social History  . Marital status: Married    Spouse name: N/A  . Number of children: N/A  . Years of education: N/A   Occupational History  . Not on file.   Social History Main Topics  . Smoking status: Current Every Day Smoker    Packs/day: 1.00    Years: 40.00    Types: Cigarettes    Last attempt to quit: 06/19/2013  . Smokeless tobacco: Never Used     Comment: Quit with Bupropion 150mg   . Alcohol use 0.6 oz/week    1 Cans of beer per week     Comment: rare  . Drug use: No  . Sexual activity: Yes   Other Topics Concern  . Not on file   Social History Narrative   He is a married father of 25, grandfather of 46. He does not really get  routine exercise.    He is down to 5 or 6 cigarettes a day. He is very seriously wanting to quit. This is a significant cutback for him, but he has pretty much been told he needs to stop by his wife, and so he fully intends to do so. He is willing to try the  patches or whatever. He has a social alcoholic beverage every now and then.     ROS:  Constitutional: Pt reports fatigue and "feeling faint". Denies fever, malaise, headache or abrupt weight changes.  HEENT: Denies ear pain, nasal congestion or sore throat. Respiratory: Pt reports exertional shortness of breath. Denies difficulty breathing, cough or sputum production.   Cardiovascular: Pt reports intermittent chest pain. Denies chest tightness, palpitations or swelling in the hands or feet.  Gastrointestinal: Pt reports reflux. Denies abdominal pain, bloating, constipation, diarrhea or blood in the stool.  Skin: Pt reports rash. Denies redness, lesions or ulcercations.  MSK:  Denies decrease in ROM, joint pain and swelling. Neurological: Denies dizziness, difficulty with memory, difficulty with speech or problems with balance and coordination.   No other specific complaints in a complete review of systems (except as listed in HPI above).  PE:  BP 112/72 (BP Location: Right Arm, Patient Position: Sitting, Cuff Size: Normal)   Pulse (!) 106   Temp 98.5 F (36.9 C) (Oral)   Wt 174 lb (78.9 kg)   SpO2 97%   BMI 23.60 kg/m   Wt Readings from Last 3 Encounters:  08/30/15 179 lb 14.4 oz (81.6 kg)  07/04/15 180 lb 3.2 oz (81.7 kg)  06/24/15 181 lb 6.4 oz (82.3 kg)    General: Appears his stated age, well developed, well nourished in NAD. Skin: Scaly, maculopapular rash noted on bilateral upper back. Resembles eczema. HEENT: Head: normal shape and size; Eyes: sclera white, no icterus, conjunctiva pink, PERRLA and EOMs intact; Nose: mucosa pink and moist, septum midline.  Cardiovascular: Tachycardic with normal rhythm. S1,S2 noted.  No  murmur, rubs or gallops noted.  Pulmonary/Chest: Normal effort and diminished vesicular breath sounds. No respiratory distress. No wheezes, rales or ronchi noted.  Abd: Soft, nontender, active BS. No distension or masses noted. Neurological: Alert and oriented.   BMET    Component Value Date/Time   NA 138 08/29/2015 0345   NA 142 11/07/2013 1509   NA 139 10/24/2013 1050   K 3.7 08/29/2015 0345   K 3.8 10/24/2013 1050   CL 106 08/29/2015 0345   CL 107 10/24/2013 1050   CO2 24 08/29/2015 0345   CO2 24 10/24/2013 1050   GLUCOSE 119 (H) 08/29/2015 0345   GLUCOSE 108 (H) 10/24/2013 1050   BUN 11 08/29/2015 0345   BUN 13 11/07/2013 1509   BUN 8 10/24/2013 1050   CREATININE 1.18 08/29/2015 0345   CREATININE 0.95 10/24/2013 1050   CREATININE 0.89 07/26/2013 0923   CALCIUM 8.7 (L) 08/29/2015 0345   CALCIUM 8.2 (L) 10/24/2013 1050   GFRNONAA >60 08/29/2015 0345   GFRNONAA >60 10/24/2013 1050   GFRAA >60 08/29/2015 0345   GFRAA >60 10/24/2013 1050    Lipid Panel     Component Value Date/Time   CHOL 224 (H) 03/04/2015 1500   TRIG 293.0 (H) 03/04/2015 1500   HDL 37.70 (L) 03/04/2015 1500   CHOLHDL 6 03/04/2015 1500   VLDL 58.6 (H) 03/04/2015 1500   LDLCALC 62 07/26/2013 0923    CBC    Component Value Date/Time   WBC 13.5 (H) 08/29/2015 0345   RBC 5.77 08/29/2015 0345   HGB 17.3 (H) 08/29/2015 0345   HGB 15.9 10/24/2013 1050   HCT 51.9 08/29/2015 0345   HCT 49.4 10/24/2013 1050   PLT 350 08/29/2015 0345   PLT 388 10/24/2013 1050   MCV 89.9 08/29/2015 0345   MCV 89 10/24/2013 1050   MCH 30.0  08/29/2015 0345   MCHC 33.3 08/29/2015 0345   RDW 13.8 08/29/2015 0345   RDW 14.0 11/07/2013 1509   RDW 14.5 10/24/2013 1050   LYMPHSABS 4.6 (H) 04/04/2015 1040   LYMPHSABS 2.8 11/07/2013 1509   MONOABS 0.9 04/04/2015 1040   EOSABS 0.1 04/04/2015 1040   EOSABS 0.2 11/07/2013 1509   BASOSABS 0.1 04/04/2015 1040   BASOSABS 0.1 11/07/2013 1509    Hgb A1C Lab Results   Component Value Date   HGBA1C 5.7 03/04/2015     Assessment and Plan:   Faint feeling:  ? If his BP is running on the low side He will discuss with cardiology about possibly reducing one of his BP meds In the meantime, stay well hydrated  RTC in 6 months for your annual exam

## 2015-09-03 NOTE — Assessment & Plan Note (Signed)
CMET and Lipid profile today Encouraged him to consume a low fat diet Niacin was stopped in hospital- advised him to discuss this with his cardiologist Continue Crestor for now

## 2015-09-04 ENCOUNTER — Telehealth: Payer: Self-pay | Admitting: *Deleted

## 2015-09-04 LAB — COMPREHENSIVE METABOLIC PANEL
ALT: 39 U/L (ref 0–53)
AST: 28 U/L (ref 0–37)
Albumin: 4.4 g/dL (ref 3.5–5.2)
Alkaline Phosphatase: 132 U/L — ABNORMAL HIGH (ref 39–117)
BUN: 9 mg/dL (ref 6–23)
CO2: 22 mEq/L (ref 19–32)
Calcium: 9.4 mg/dL (ref 8.4–10.5)
Chloride: 105 mEq/L (ref 96–112)
Creatinine, Ser: 0.96 mg/dL (ref 0.40–1.50)
GFR: 86.34 mL/min (ref >60.00)
Glucose, Bld: 82 mg/dL (ref 70–99)
Potassium: 4.1 mEq/L (ref 3.5–5.1)
Sodium: 136 mEq/L (ref 135–145)
Total Bilirubin: 0.3 mg/dL (ref 0.2–1.2)
Total Protein: 7.6 g/dL (ref 6.0–8.3)

## 2015-09-04 LAB — CBC
HCT: 54.6 % — ABNORMAL HIGH (ref 39.0–52.0)
Hemoglobin: 18.5 g/dL (ref 13.0–17.0)
MCHC: 33.9 g/dL (ref 30.0–36.0)
MCV: 87.8 fl (ref 78.0–100.0)
Platelets: 363 10*3/uL (ref 150.0–400.0)
RBC: 6.22 Mil/uL — ABNORMAL HIGH (ref 4.22–5.81)
RDW: 13.7 % (ref 11.5–15.5)
WBC: 13.3 10*3/uL — ABNORMAL HIGH (ref 4.0–10.5)

## 2015-09-04 LAB — LIPID PANEL
Cholesterol: 196 mg/dL (ref 0–200)
HDL: 33.8 mg/dL — ABNORMAL LOW (ref >39.00)
Total CHOL/HDL Ratio: 6
Triglycerides: 402 mg/dL — ABNORMAL HIGH (ref 0.0–149.0)

## 2015-09-04 LAB — HEMOGLOBIN A1C: Hgb A1c MFr Bld: 5.6 % (ref 4.6–6.5)

## 2015-09-04 LAB — LDL CHOLESTEROL, DIRECT: Direct LDL: 109 mg/dL

## 2015-09-04 NOTE — Telephone Encounter (Signed)
noted 

## 2015-09-04 NOTE — Telephone Encounter (Signed)
Received call with critical lab results from [place] at the [facility] lab. Hgb is 18.5, Hct 54.6. Results to be released in Epic once all tests are resulted.

## 2015-09-09 ENCOUNTER — Encounter: Payer: Self-pay | Admitting: Cardiology

## 2015-09-09 NOTE — Progress Notes (Signed)
PCP: Webb Silversmith, NP  Clinic Note: Chief Complaint  Patient presents with  . Other    F/u hospital due to chest pain, low BP, syncope, dumbness left arm and sob. Meds reviewed verbally with pt.  . Coronary Artery Disease    Stable angina  . Cardiomyopathy    HPI: Troy Parrish is a 55 y.o. male with a PMH below who presents today for Four-month follow-up of CAD-PCI with prior instillation MI. I last saw him in February 2016, but he saw Tarri Fuller since then. His most recent intervention was back in October of 2015 for persistent class III exertional dyspnea and chest pain. Perform PCI of the distal RCA with PTCA of both the posterolateral branches.  Troy Parrish was last seen in clinic by me on 06/16/2015. He was feeling well at that time. He is becoming more involved with his family. Going to see his grandkids and family. Continuing to the daily chores of cooking and cleaning. He has even been going on trips. Denied any anginal symptoms or dyspnea symptoms. We discussed his second opinion cardiology consult at [facility]. Basically the results metered what we have been showing.  Recent Hospitalizations:   Admitted to [facility] overnight from July 21 to the 22nd complaints of chest pain and pressure radiating to his left arm apparently for the last 2 months, since his last visit, he stopped taking his medications and simply could not explain why.  He stated that one episode was an acute onset of chest pain. But he had not been having day-to-day chest pain prior to that. The pain apparently appear to be different. He also noted having restarted smoking a pack a day, and was not using CPAP. He also noted some orthostatic dizziness. He was seen and evaluated by Dr. Martinique who felt that ischemic evaluation was not warranted. He ruled out for MI with negative cardiac enzymes. Recently placed the patient back on his medications including Effient, aspirin and long-acting nitrate. He also added Ranexa for  anginal relief. Continued losartan and long-acting Toprol.  Studies Reviewed: Past surgical history updated  no new studies performed.   Interval History: Troy Parrish presents today for follow-up. He still has not started his medications back from being in the hospital. He was really scared when he had the episode that took him to the hospital, was happy to hear that he ruled out for MI. He has not had any further of those episodes. He still has a little bit of dizziness and lightheadedness and was scared to restart medications. He has not been as active as he had been prior to his hospital stay since, but is starting to give back into activity is not really noticing any significant resting or exertional dyspnea or chest discomfort. He's been little bit leery of doing that much to live exertion until he started his medications back. He is not had used sublingual nitroglycerin however. He is low but concerned about the cost of Ranexa, but is hoping to be able to complete some online paperwork that will help reduce the cost.  He really can't explain why he stopped taking his medications prior to his hospital stay, is sort of change himself about it. Despite that he really was doing fine until he went to the hospital. He thinks his blood pressure just got too high. He was a little bit dyspneic a little associated with the chest discomfort and that all resolved pretty quickly.  He still denies any PND, orthopnea or  edema.  No palpitations, lightheadedness, dizziness, weakness or syncope/near syncope. No TIA/amaurosis fugax symptoms. No melena, hematochezia, hematuria, or epstaxis. No claudication.  ROS: A comprehensive was performed. Review of Systems  Constitutional: Positive for weight loss (Intentional). Negative for chills, fever and malaise/fatigue.  HENT: Negative for congestion and nosebleeds.   Respiratory: Positive for cough (Cough and little bit more since he restarted cigarettes. He is  planning on getting back on the wagon with smoking cessation.), shortness of breath (Baseline shortness of breath. ) and wheezing (Not as much. Usually exacerbated by the pollen however.).   Cardiovascular: Negative for palpitations and claudication.  Gastrointestinal: Negative for blood in stool, heartburn (Certain foods.) and melena.  Genitourinary: Negative for hematuria.  Musculoskeletal: Positive for back pain and joint pain. Negative for falls and myalgias.  Neurological: Negative for headaches.  Endo/Heme/Allergies: Bruises/bleeds easily.  Psychiatric/Behavioral: Negative for depression and memory loss. The patient has insomnia (He does have insomnia, but is notably improved.).        Overall his psychological status is much improved. He is pretty much come to grips with his condition and is making most of it. He is happier now that he has been a long time. Looking forward to doing things with his wife and children. Also grandchildren  All other systems reviewed and are negative.    Past Medical History:  Diagnosis Date  . CAD S/P percutaneous coronary angioplasty 07/07/2011; 6 & 10 2015   S/P PCI to all 3 major vessels;a)  Ant STEMI 2001- BMS-LAD x2 -->(redo PCI 6/'15 -- pLAD Xience DES 2.5 x 12- 2.75 mm, mLAD 2.25 x 12 - 2.7 mm), b) '06 UA --> Cx- OM DES; c) 2013 Inf MI BMS mRCA --> d) 10/'15 PCI dRCA Promus P DES 4.0 x 16 (4.25 mm); PTCA of RPL2 (2.0 mm) &RPDA (2.25 mm) 11/2013  . Chronic bronchitis ([place]) "used to get it q yr"  . COPD (chronic obstructive pulmonary disease) ([place])   . Elevated WBC count   . Emphysema of lung ([place])   . Essential hypertension 07/07/2011  . Former heavy tobacco smoker     quit in May 2015 after multiple attempts at trying to quit before   . GERD (gastroesophageal reflux disease)   . Glucose intolerance (pre-diabetes) June 2015    hemoglobin A1c 6.6  . Headache    "Imdur related; stopped taking it; headaches went away" (11/15/2013)  . Heart murmur   .  History of colon polyps   . History of stomach ulcers   . Hyperlipidemia with target LDL less than 70 07/07/2011  . Metabolic syndrome    Pre-diabetes, hypertension and truncal obesity as well as dyslipidemia  . OSA on CPAP   . Pneumonia "several times"  . Recurrent upper respiratory infection (URI)   . Seizures ([place]) "several"   "last one was ~ 2011" (11/15/2013)  . Shortness of breath dyspnea   . ST elevation myocardial infarction (STEMI) of anterior wall ([place]) 2001   History of -- 2 stents in early and distal mid LAD; prior cardiologist was Dr. Remi Haggard in [place]  . ST elevation myocardial infarction (STEMI) of inferior wall ([place]) 07/07/2011   Occluded RCA 9.32I71 INTEGRITY; Echo 07/2013: EF 45-50%, Inferior & Posterolateral HK.   Marland Kitchen Umbilical hernia   . Unstable angina [facility])  2006; June 2015   Cx-OM - PCI 2.5 mm 13 mm Cypher DES [name], [place]) ; 07/2013: Severe ISR of both prox & Distal LAD stentS --> 2 DES stents (  1 at prox edge of the proximal stent, 2nd covers the entire distal stent as well as proximal and distal edge stenose)   . Upper respiratory infection     Past Surgical History:  Procedure Laterality Date  . Cardiac MRI [facility]  09/2014   EF 51%. Mod HK of basal-mid Inf wall, mild HK of basal inferoseptum & basal-mid inferolateral wall with hyper enhancement (c/w subendocard RCA MI with significant viability), focal hyper enhancement of apical wall c/w dLAD subendocard MI & complete LAD viability. . No aortic stenosis. Normal RV function. No Ischemia on Adenosine Stress.  . COLONOSCOPY WITH PROPOFOL N/A 03/28/2015   Procedure: COLONOSCOPY WITH PROPOFOL;  Surgeon: Lucilla Lame, MD;  Location: [place];  Service: Endoscopy;  Laterality: N/A;  . CORONARY ANGIOPLASTY WITH STENT PLACEMENT  2001   ANTERIOR mi with a  PCI to LAD; [place], [place] - Dr. Dorris Fetch  . CORONARY ANGIOPLASTY WITH STENT PLACEMENT  2006   [place] by Dr Dorris Fetch -lesion lft circ/OM1 with 2.5 x 79m  Cypher DES  . ESOPHAGOGASTRODUODENOSCOPY (EGD) WITH PROPOFOL N/A 03/28/2015   Procedure: ESOPHAGOGASTRODUODENOSCOPY (EGD) WITH PROPOFOL;  Surgeon: DLucilla Lame MD;  [place]  Service: Endoscopy;  Laterality: N/A;  . INSERTION OF MESH N/A 04/02/2015   Procedure: INSERTION OF MESH;  Surgeon: RFlorene Glen MD;  Location: [facility];  Service: General;  Laterality: N/A;  . LEFT AND RIGHT HEART CATHETERIZATION WITH CORONARY ANGIOGRAM N/A 07/30/2013   Procedure: LEFT AND RIGHT HEART CATHETERIZATION WITH CORONARY ANGIOGRAM;  Surgeon: DLeonie Man MD;  [facility];  Service: Cardiovascular;  2 separate P & mLAD lesions -> PCI, MOd-Severe dRCA disesase with diffuse RPL/PDA disease (FFR)  . LEFT HEART CATH  08/03/2013   Procedure: LEFT HEART CATH;  Surgeon: DLeonie Man MD;  [facility];  Service: Cardiovascular;;FFR of RCA - non-flow-limiting  . LEFT HEART CATHETERIZATION WITH CORONARY ANGIOGRAM N/A 07/07/2011   Procedure: LEFT HEART CATHETERIZATION WITH CORONARY ANGIOGRAM;  Surgeon: DLeonie Man MD;  [facility];  Service: Cardiovascular;  inferolat post LV infarct ST-elevation MI  . MET/CPET  11/09/2011   submax. effort 1.05 RER peak V02 was 51% ,chronotropic incomp.hrt rate lows 80s to 100  . MET/CPET  September 2016   [facility]: Mild functional impairment due primarily to mild pulmonary and circulatory limitations. Also suggest physical deconditioning (seen with low-normal aerobic reserve). Mild VQ mismatch with exercise.  Ventilatory reserve exhausted with peak exercise demonstrated pulmonary limitation. No significant decrease in postexercise FEV1 compared to rest --> suggest no exercise induced bronchospasm  . [place]  11/30/2011   EF 45% ,exercise 10 METS, infarct\scar w mild perinfarct ischemia -basal inferolat and mid inferolat region  . PERCUTANEOUS CORONARY STENT INTERVENTION (PCI-S)  07/07/2011   Procedure: PERCUTANEOUS CORONARY  STENT INTERVENTION (PCI-S);  Surgeon: DLeonie Man MD;  [facility];  Service: Cardiovascular;;occluded RCA ,Integrity Resolute DES 2.75 x 18 mm stent - post-dilated 3.1 mm  . PERCUTANEOUS CORONARY STENT INTERVENTION (PCI-S)  07/30/2013   Procedure: PERCUTANEOUS CORONARY STENT INTERVENTION (PCI-S);  Surgeon: DLeonie Man MD;  [facility];  Service: Cardiovascular;;Distal mid LAD: Xience Alpine DES 2.25 mm x 28 mm (overlapping proximal and distal edge of previous stent) - 2.7 mm; proximal LAD 2.5 mm x 12 mm Xience Alpine DES (2.8 mm);; RCA 60-70% stenosis planned staged procedure  . PERCUTANEOUS CORONARY STENT INTERVENTION (PCI-S) N/A 11/15/2013   Procedure: PERCUTANEOUS  CORONARY STENT INTERVENTION (PCI-S);  Surgeon: Leonie Man, MD;  [facility];  Service: Cardiovascular;  Patent Cx & LAD Stents; PCI -dRCA Promus Premier DES 4.0 mm x 16 mm (4.25 mm) dRCA, 2.0 mm Cutting PTCA of RPL2 [place] & POBA of mRPDA  . POLYPECTOMY  03/28/2015   Procedure: POLYPECTOMY;  Surgeon: Lucilla Lame, MD;  Location: [place];  Service: Endoscopy;;  . RIGHT HEART CATH  June 2015   Normal pressures with severely reduced cardiac output and index. (3.25/1.55)  . TRANSTHORACIC ECHOCARDIOGRAM  07/31/2013; February 2017   a. LVEF 45-50. Inferior posterior hypokinesis with mild dilation. Apparent normal diastolic pressures;   . UMBILICAL HERNIA REPAIR N/A 04/02/2015   Procedure: HERNIA REPAIR UMBILICAL ADULT;  Surgeon: Florene Glen, MD;  Location: [facility];  Service: General;  Laterality: N/A;   Meds -- has not restarted meds Prior to Admission medications   Medication Sig Start Date End Date Taking? Authorizing Provider  albuterol (PROVENTIL HFA) 108 (90 Base) MCG/ACT inhaler Inhale 1 puff into the lungs every 6 (six) hours as needed. Patient not taking: Reported on 09/10/2015 06/24/15   Burnard Hawthorne, FNP  aspirin EC 81 MG EC tablet Take 2 tablets (162 mg total) by mouth  at bedtime. Patient not taking: Reported on 09/10/2015 08/30/15   Arbutus Leas, NP  clobetasol cream (TEMOVATE) 1.96 % APPLY 1 APPLICATION TOPICALLY 2 (TWO) TIMES DAILY. Patient not taking: Reported on 09/10/2015 09/03/15   Jearld Fenton, NP  isosorbide mononitrate (IMDUR) 30 MG 24 hr tablet Take 1 tablet (30 mg total) by mouth daily. Patient not taking: Reported on 09/10/2015 08/30/15   Arbutus Leas, NP  losartan (COZAAR) 25 MG tablet Take 1 tablet (25 mg total) by mouth daily. Patient not taking: Reported on 09/10/2015 08/30/15   Arbutus Leas, NP  metoprolol succinate (TOPROL-XL) 50 MG 24 hr tablet Take 1 tablet (50 mg total) by mouth daily. Take with or immediately following a meal. Patient not taking: Reported on 09/10/2015 08/30/15   Arbutus Leas, NP  nitroGLYCERIN (NITROSTAT) 0.4 MG SL tablet Place 1 tablet (0.4 mg total) under the tongue every 5 (five) minutes as needed for chest pain. Patient not taking: Reported on 09/10/2015 06/04/15   Leonie Man, MD  omeprazole (PRILOSEC) 20 MG capsule Take 1 capsule (20 mg total) by mouth daily. Patient not taking: Reported on 09/10/2015 09/03/15   Jearld Fenton, NP  prasugrel (EFFIENT) 10 MG TABS tablet Take 1 tablet (10 mg total) by mouth daily. Patient not taking: Reported on 09/10/2015 08/30/15   Arbutus Leas, NP  ranolazine (RANEXA) 500 MG 12 hr tablet Take 1 tablet (500 mg total) by mouth 2 (two) times daily. Patient not taking: Reported on 09/10/2015 08/30/15   Arbutus Leas, NP  rosuvastatin (CRESTOR) 20 MG tablet Take 1 tablet (20 mg total) by mouth daily. Patient not taking: Reported on 09/10/2015 08/30/15   Arbutus Leas, NP     Allergies  Allergen Reactions  . Niacin And Related Hives and Itching    Social History   Social History  . Marital status: Married    Spouse name: N/A  . Number of children: N/A  . Years of education: N/A   Social History Main Topics  . Smoking status: Current Every Day Smoker    Packs/day: 1.00    Years: 40.00     Types: Cigarettes    Last attempt to quit: 06/19/2013  .  Smokeless tobacco: Never Used     Comment: Quit with Bupropion '150mg'$  - > unfortunately, he restarted  . Alcohol use 0.6 oz/week    1 Cans of beer per week     Comment: rare  . Drug use: No  . Sexual activity: Yes   Other Topics Concern  . None   Social History Narrative   He is a married father of 23, grandfather of 23. He does not really get routine exercise.    He is down to 5 or 6 cigarettes a day. He is very seriously wanting to quit. This is a significant cutback for him, but he has pretty much been told he needs to stop by his wife, and so he fully intends to do so. He is willing to try the patches or whatever. He has a social alcoholic beverage every now and then.    Family History family history includes Cancer in his maternal aunt; Coronary artery disease in his father; Diabetes in his maternal grandfather, maternal grandmother, and mother; Emphysema in his maternal grandfather; Heart disease in his father, paternal grandfather, and paternal grandmother; Hyperlipidemia in his maternal grandfather, maternal grandmother, and mother; Hypertension in his father; Stroke in his father.   Wt Readings from Last 3 Encounters:  09/10/15 174 lb 12 oz (79.3 kg)  09/03/15 174 lb (78.9 kg)  08/30/15 179 lb 14.4 oz (81.6 kg)    PHYSICAL EXAM BP (!) 144/91 (BP Location: Right Leg, Patient Position: Sitting, Cuff Size: Normal)   Pulse (!) 104   Ht 6' (1.829 m)   Wt 174 lb 12 oz (79.3 kg)   BMI 23.70 kg/m  General appearance: alert, cooperative, appears stated age, he no longer seems depressed. He actually has more color than usual today and in somewhat better spirits. Well-nourished and well-groomed  HEENT: [place]/AT, EOMI, MMM, anicteric sclera Neck: no adenopathy, no carotid bruit, no JVD and supple, symmetrical, trachea midline  Lungs: diminished breath sounds bilaterally, wheezes bilaterally and Otherwise the most part CPAP.  Nonlabored. Increased AP diameter. Prolonged expiratory phase  Heart: RRR with ectopy, S1, S2 normal, no murmur, click, rub or gallop and normal apical impulse  Abdomen: soft, non-tender; bowel sounds normal; no masses, no organomegaly and Mildly protuberant  Extremities: extremities normal, atraumatic, no cyanosis or edema  Pulses: 2+ and symmetric  Neurologic: Mental status: Alert, oriented, thought content appropriate, affect: blunted and mood-congruent  Cranial nerves: normal will    Adult ECG Report  Rate: 104;  Rhythm: sinus tachycardia; Normal axis, intervals & durations.  Narrative Interpretation: stable, normal EKG.  sinus tachycardia has replaced sinus rhythm   Other studies Reviewed: Additional studies/ records that were reviewed today include:  Recent Labs:   Lab Results  Component Value Date   CHOL 196 09/03/2015   HDL 33.80 (L) 09/03/2015   LDLDIRECT 109.0 09/03/2015   TRIG (H) 09/03/2015    402.0 Triglyceride is over 400; calculations on Lipids are invalid.   CHOLHDL 6 09/03/2015  -- He had stopped taking niacin and noticed this change since. Also has not been taking his statin.  ASSESSMENT / PLAN: Problem List Items Addressed This Visit    Stable angina ([place]) (Chronic)    With the exception of about 1 concerning episode, he really hasn't had that much in the way of any significant symptoms since we did his PCI last. He has been stable on low-dose Imdur which I wonder restarted along with Ranexa. Were having to back off on his Toprol, which  makes his Ranexa and Imdur even more important.      Relevant Medications   rosuvastatin (CRESTOR) 20 MG tablet   ranolazine (RANEXA) 500 MG 12 hr tablet   nitroGLYCERIN (NITROSTAT) 0.4 MG SL tablet   metoprolol succinate (TOPROL-XL) 25 MG 24 hr tablet   losartan (COZAAR) 25 MG tablet   isosorbide mononitrate (IMDUR) 30 MG 24 hr tablet   aspirin 81 MG EC tablet   Other Relevant Orders   EKG 12-Lead (Completed)   ST  elevation myocardial infarction (STEMI) of anterior wall -- PCI to LAD (Chronic)   Relevant Medications   rosuvastatin (CRESTOR) 20 MG tablet   ranolazine (RANEXA) 500 MG 12 hr tablet   nitroGLYCERIN (NITROSTAT) 0.4 MG SL tablet   metoprolol succinate (TOPROL-XL) 25 MG 24 hr tablet   losartan (COZAAR) 25 MG tablet   isosorbide mononitrate (IMDUR) 30 MG 24 hr tablet   aspirin 81 MG EC tablet   ST elevation myocardial infarction (STEMI) of inferior wall (HCC) (Chronic)   Relevant Medications   rosuvastatin (CRESTOR) 20 MG tablet   ranolazine (RANEXA) 500 MG 12 hr tablet   nitroGLYCERIN (NITROSTAT) 0.4 MG SL tablet   metoprolol succinate (TOPROL-XL) 25 MG 24 hr tablet   losartan (COZAAR) 25 MG tablet   isosorbide mononitrate (IMDUR) 30 MG 24 hr tablet   aspirin 81 MG EC tablet   Hyperlipidemia with target LDL less than 70; by his report he is back on Crestor (Chronic)    Overall his lipids don't look that bad for someone is not been on his medications. His triglycerides are high which may be diet related. That means his LDL is difficult to calculate. Direct LDL is a bit high. Plan restart Crestor, and I would actually restart his Niaspan.  His PCP is following his labs which thankfully on our Epic system      Relevant Medications   rosuvastatin (CRESTOR) 20 MG tablet   ranolazine (RANEXA) 500 MG 12 hr tablet   nitroGLYCERIN (NITROSTAT) 0.4 MG SL tablet   metoprolol succinate (TOPROL-XL) 25 MG 24 hr tablet   losartan (COZAAR) 25 MG tablet   isosorbide mononitrate (IMDUR) 30 MG 24 hr tablet   aspirin 81 MG EC tablet   History of nonadherence to medical treatment (Chronic)    This seems to be a recurring issue with him. It also seems to be the every time he stopped taking medication he gets in trouble. Prior to this recent episode, the last time he stopped his medicines as when we ended up doing extensive PCI to his LAD and then RCA. He still will be concerned about this last episode of  most of follow-up closely. We had a heart-to-heart talk about his medications. It doesn't seem to be a financial issue and he is trying to figure out ways of getting his Ranexa. All the medicines he is on, this is probably one that is helping his symptoms, but is not mandatory. I think when he starts feeling better, he just decided to stop taking medications. Thankfully his wife is with him today and together we had a long talk with the patient about needing to stay on meds.      Essential hypertension (Chronic)    BPs are still running a little low side at baseline, but high today. I think we need to get back on his Cozaar and Toprol but lower dose Toprol.      Relevant Medications   rosuvastatin (CRESTOR) 20 MG tablet  ranolazine (RANEXA) 500 MG 12 hr tablet   nitroGLYCERIN (NITROSTAT) 0.4 MG SL tablet   metoprolol succinate (TOPROL-XL) 25 MG 24 hr tablet   losartan (COZAAR) 25 MG tablet   isosorbide mononitrate (IMDUR) 30 MG 24 hr tablet   aspirin 81 MG EC tablet   Other Relevant Orders   EKG 12-Lead (Completed)   Chronotropic incompetence with left ventricular dysfunction --> improved with reduction of beta blocker (Chronic)    We seem to go back and forth with this. He has tachycardia when not on a beta blocker, and I think his last episode may have been rebound hypertension tachycardia from being off his Toprol. At this point I think instead of restarting back to 50 mg were discussed the go to 25. He really has had a lot of fatigue and and dizziness associated with it. I think he would be more likely to take it is a lower dose. We'll just see how he does on 25 mg.      Relevant Medications   rosuvastatin (CRESTOR) 20 MG tablet   ranolazine (RANEXA) 500 MG 12 hr tablet   nitroGLYCERIN (NITROSTAT) 0.4 MG SL tablet   metoprolol succinate (TOPROL-XL) 25 MG 24 hr tablet   losartan (COZAAR) 25 MG tablet   isosorbide mononitrate (IMDUR) 30 MG 24 hr tablet   aspirin 81 MG EC tablet    Chronic combined systolic and diastolic congestive heart failure, NYHA class 2 (HCC) (Chronic)    Actually remains euvolemic. Not requiring any diuretic at this time. When I see him back, we may talk about potentially having a when necessary prescription for Lasix. He is on low-dose of both ARB and beta blocker, but really his tenuous blood pressures have precluded any titration. I think a lot of his exertional dyspnea is probably JUST related to deconditioning so we talked about him needing to get back and exercise and staying active.      Relevant Medications   rosuvastatin (CRESTOR) 20 MG tablet   ranolazine (RANEXA) 500 MG 12 hr tablet   nitroGLYCERIN (NITROSTAT) 0.4 MG SL tablet   metoprolol succinate (TOPROL-XL) 25 MG 24 hr tablet   losartan (COZAAR) 25 MG tablet   isosorbide mononitrate (IMDUR) 30 MG 24 hr tablet   aspirin 81 MG EC tablet   Other Relevant Orders   EKG 12-Lead (Completed)   Cardiomyopathy, ischemic; EF~45% with hypokinesis of the inferior and inferolateral myocardium; CO 3.25 (Chronic)    Relatively stable and preserved EF despite multiple MIs. Stable class II symptoms of dyspnea. On Toprol and Cozaar to the extent that we can have them on when he is taking it. Currently no diuretic requirement.      Relevant Medications   rosuvastatin (CRESTOR) 20 MG tablet   ranolazine (RANEXA) 500 MG 12 hr tablet   nitroGLYCERIN (NITROSTAT) 0.4 MG SL tablet   metoprolol succinate (TOPROL-XL) 25 MG 24 hr tablet   losartan (COZAAR) 25 MG tablet   isosorbide mononitrate (IMDUR) 30 MG 24 hr tablet   aspirin 81 MG EC tablet   Other Relevant Orders   EKG 12-Lead (Completed)   CAD S/P percutaneous coronary angioplasty - Primary (Chronic)    Several PCI's. He is on Effient because he did not do well with Plavix. He is not necessarily need to be on aspirin, I told him on several occasions he can stop aspirin. Once again another cause medications he is on beta blocker and nitrate, ARB  as well as Ranexa. He is also supposed  to be on a statin.      Relevant Medications   rosuvastatin (CRESTOR) 20 MG tablet   ranolazine (RANEXA) 500 MG 12 hr tablet   nitroGLYCERIN (NITROSTAT) 0.4 MG SL tablet   metoprolol succinate (TOPROL-XL) 25 MG 24 hr tablet   losartan (COZAAR) 25 MG tablet   isosorbide mononitrate (IMDUR) 30 MG 24 hr tablet   aspirin 81 MG EC tablet   Other Relevant Orders   EKG 12-Lead (Completed)    Other Visit Diagnoses   None.     Current medicines are reviewed at length with the patient today. (+/- concerns) - has not restarted meds The following changes have been made:   RESTART your cardiac medications. (See attached list) DECREASE metoprolol to '25mg'$  once daily (in the evening) when you take your Imdur. One week after restarting metoprolol, restart losartan once daily (take at lunch).   Your physician wants you to follow-up in October  [place].  Studies Ordered:   Orders Placed This Encounter  Procedures  . EKG 12-Lead   Mostly because of his persistent medical nonadherence, I spent close to 40 minutes in direct consultation with the patient. Over 50% of which was in direct consultation and counseling. Additional 15 minutes was spent in chart review and charting.    Glenetta Hew, M.D., M.S. Interventional Cardiologist   Pager # [phone] Phone # [phone] [address]. [place],  [address]

## 2015-09-10 ENCOUNTER — Ambulatory Visit (INDEPENDENT_AMBULATORY_CARE_PROVIDER_SITE_OTHER): Payer: 59 | Admitting: Cardiology

## 2015-09-10 ENCOUNTER — Encounter: Payer: Self-pay | Admitting: Cardiology

## 2015-09-10 VITALS — BP 144/91 | HR 104 | Ht 72.0 in | Wt 174.8 lb

## 2015-09-10 DIAGNOSIS — Z9861 Coronary angioplasty status: Secondary | ICD-10-CM

## 2015-09-10 DIAGNOSIS — I255 Ischemic cardiomyopathy: Secondary | ICD-10-CM | POA: Diagnosis not present

## 2015-09-10 DIAGNOSIS — Z9119 Patient's noncompliance with other medical treatment and regimen: Secondary | ICD-10-CM

## 2015-09-10 DIAGNOSIS — I10 Essential (primary) hypertension: Secondary | ICD-10-CM

## 2015-09-10 DIAGNOSIS — I208 Other forms of angina pectoris: Secondary | ICD-10-CM

## 2015-09-10 DIAGNOSIS — I2510 Atherosclerotic heart disease of native coronary artery without angina pectoris: Secondary | ICD-10-CM

## 2015-09-10 DIAGNOSIS — I5042 Chronic combined systolic (congestive) and diastolic (congestive) heart failure: Secondary | ICD-10-CM | POA: Diagnosis not present

## 2015-09-10 DIAGNOSIS — I2119 ST elevation (STEMI) myocardial infarction involving other coronary artery of inferior wall: Secondary | ICD-10-CM

## 2015-09-10 DIAGNOSIS — I5189 Other ill-defined heart diseases: Secondary | ICD-10-CM | POA: Diagnosis not present

## 2015-09-10 DIAGNOSIS — I2089 Other forms of angina pectoris: Secondary | ICD-10-CM

## 2015-09-10 DIAGNOSIS — Z91199 Patient's noncompliance with other medical treatment and regimen due to unspecified reason: Secondary | ICD-10-CM

## 2015-09-10 DIAGNOSIS — I2109 ST elevation (STEMI) myocardial infarction involving other coronary artery of anterior wall: Secondary | ICD-10-CM

## 2015-09-10 DIAGNOSIS — E785 Hyperlipidemia, unspecified: Secondary | ICD-10-CM

## 2015-09-10 MED ORDER — LOSARTAN POTASSIUM 25 MG PO TABS: 25.0000 mg | ORAL_TABLET | Freq: Every day | ORAL | 3 refills | Status: DC

## 2015-09-10 MED ORDER — ROSUVASTATIN CALCIUM 20 MG PO TABS: 20.0000 mg | ORAL_TABLET | Freq: Every day | ORAL | 3 refills | Status: DC

## 2015-09-10 MED ORDER — METOPROLOL SUCCINATE ER 25 MG PO TB24: 25.0000 mg | ORAL_TABLET | Freq: Every day | ORAL | 3 refills | Status: DC

## 2015-09-10 MED ORDER — RANOLAZINE ER 500 MG PO TB12: 500.0000 mg | ORAL_TABLET | Freq: Two times a day (BID) | ORAL | 3 refills | Status: DC

## 2015-09-10 MED ORDER — ASPIRIN 81 MG PO TBEC: 162.0000 mg | DELAYED_RELEASE_TABLET | Freq: Every day | ORAL | Status: DC

## 2015-09-10 MED ORDER — PRASUGREL HCL 10 MG PO TABS: 10.0000 mg | ORAL_TABLET | Freq: Every day | ORAL | 3 refills | Status: DC

## 2015-09-10 MED ORDER — ISOSORBIDE MONONITRATE ER 30 MG PO TB24: 30.0000 mg | ORAL_TABLET | Freq: Every day | ORAL | 3 refills | Status: DC

## 2015-09-10 MED ORDER — NITROGLYCERIN 0.4 MG SL SUBL: 0.4000 mg | SUBLINGUAL_TABLET | SUBLINGUAL | 3 refills | Status: DC | PRN

## 2015-09-10 NOTE — Patient Instructions (Addendum)
Medication Instructions:  Your physician has recommended you make the following change in your medication:  RESTART your cardiac medications. (See attached list) DECREASE metoprolol to 25mg  once daily (in the evening) when you take your Imdur. One week after restarting metoprolol, restart losartan once daily (take at lunch).    Labwork: none  Testing/Procedures: none  Follow-Up: Your physician recommends that you schedule a follow-up appointment in: October with Dr. Ellyn Hack.    Any Other Special Instructions Will Be Listed Below (If Applicable).     If you need a refill on your cardiac medications before your next appointment, please call your pharmacy.

## 2015-09-12 ENCOUNTER — Other Ambulatory Visit: Payer: Self-pay | Admitting: Family

## 2015-09-12 ENCOUNTER — Encounter: Payer: Self-pay | Admitting: Cardiology

## 2015-09-12 DIAGNOSIS — Z91199 Patient's noncompliance with other medical treatment and regimen due to unspecified reason: Secondary | ICD-10-CM | POA: Insufficient documentation

## 2015-09-12 DIAGNOSIS — Z9119 Patient's noncompliance with other medical treatment and regimen: Secondary | ICD-10-CM | POA: Insufficient documentation

## 2015-09-12 DIAGNOSIS — J449 Chronic obstructive pulmonary disease, unspecified: Secondary | ICD-10-CM

## 2015-09-12 NOTE — Assessment & Plan Note (Signed)
BPs are still running a little low side at baseline, but high today. I think we need to get back on his Cozaar and Toprol but lower dose Toprol.

## 2015-09-12 NOTE — Assessment & Plan Note (Signed)
This seems to be a recurring issue with him. It also seems to be the every time he stopped taking medication he gets in trouble. Prior to this recent episode, the last time he stopped his medicines as when we ended up doing extensive PCI to his LAD and then RCA. He still will be concerned about this last episode of most of follow-up closely. We had a heart-to-heart talk about his medications. It doesn't seem to be a financial issue and he is trying to figure out ways of getting his Ranexa. All the medicines he is on, this is probably one that is helping his symptoms, but is not mandatory. I think when he starts feeling better, he just decided to stop taking medications. Thankfully his wife is with him today and together we had a long talk with the patient about needing to stay on meds.

## 2015-09-12 NOTE — Assessment & Plan Note (Signed)
We seem to go back and forth with this. He has tachycardia when not on a beta blocker, and I think his last episode may have been rebound hypertension tachycardia from being off his Toprol. At this point I think instead of restarting back to 50 mg were discussed the go to 25. He really has had a lot of fatigue and and dizziness associated with it. I think he would be more likely to take it is a lower dose. We'll just see how he does on 25 mg.

## 2015-09-12 NOTE — Assessment & Plan Note (Signed)
With the exception of about 1 concerning episode, he really hasn't had that much in the way of any significant symptoms since we did his PCI last. He has been stable on low-dose Imdur which I wonder restarted along with Ranexa. Were having to back off on his Toprol, which makes his Ranexa and Imdur even more important.

## 2015-09-12 NOTE — Assessment & Plan Note (Signed)
Actually remains euvolemic. Not requiring any diuretic at this time. When I see him back, we may talk about potentially having a when necessary prescription for Lasix. He is on low-dose of both ARB and beta blocker, but really his tenuous blood pressures have precluded any titration. I think a lot of his exertional dyspnea is probably JUST related to deconditioning so we talked about him needing to get back and exercise and staying active.

## 2015-09-12 NOTE — Assessment & Plan Note (Signed)
Several PCI's. He is on Effient because he did not do well with Plavix. He is not necessarily need to be on aspirin, I told him on several occasions he can stop aspirin. Once again another cause medications he is on beta blocker and nitrate, ARB as well as Ranexa. He is also supposed to be on a statin.

## 2015-09-12 NOTE — Assessment & Plan Note (Signed)
Overall his lipids don't look that bad for someone is not been on his medications. His triglycerides are high which may be diet related. That means his LDL is difficult to calculate. Direct LDL is a bit high. Plan restart Crestor, and I would actually restart his Niaspan.  His PCP is following his labs which thankfully on our Epic system

## 2015-09-12 NOTE — Assessment & Plan Note (Signed)
Relatively stable and preserved EF despite multiple MIs. Stable class II symptoms of dyspnea. On Toprol and Cozaar to the extent that we can have them on when he is taking it. Currently no diuretic requirement.

## 2015-09-22 ENCOUNTER — Other Ambulatory Visit: Payer: Self-pay

## 2015-09-22 MED ORDER — PRASUGREL HCL 10 MG PO TABS
10.0000 mg | ORAL_TABLET | Freq: Every day | ORAL | 3 refills | Status: DC
Start: 2015-09-22 — End: 2015-12-18

## 2015-10-04 ENCOUNTER — Other Ambulatory Visit: Payer: Self-pay | Admitting: Cardiology

## 2015-10-23 ENCOUNTER — Other Ambulatory Visit: Payer: Self-pay

## 2015-10-23 MED ORDER — METOPROLOL SUCCINATE ER 25 MG PO TB24
25.0000 mg | ORAL_TABLET | Freq: Every day | ORAL | 3 refills | Status: DC
Start: 2015-10-23 — End: 2016-04-28

## 2015-10-23 MED ORDER — LOSARTAN POTASSIUM 25 MG PO TABS: 25.0000 mg | ORAL_TABLET | Freq: Every day | ORAL | 3 refills | Status: DC

## 2015-10-23 NOTE — Telephone Encounter (Signed)
Rx(s) sent to pharmacy electronically.  

## 2015-10-29 ENCOUNTER — Other Ambulatory Visit: Payer: Self-pay

## 2015-10-29 MED ORDER — OMEPRAZOLE 20 MG PO CPDR: 20.0000 mg | DELAYED_RELEASE_CAPSULE | Freq: Every day | ORAL | 1 refills | Status: DC

## 2015-11-11 ENCOUNTER — Ambulatory Visit (INDEPENDENT_AMBULATORY_CARE_PROVIDER_SITE_OTHER): Payer: 59

## 2015-11-11 DIAGNOSIS — Z23 Encounter for immunization: Secondary | ICD-10-CM

## 2015-11-14 ENCOUNTER — Ambulatory Visit (INDEPENDENT_AMBULATORY_CARE_PROVIDER_SITE_OTHER): Payer: 59 | Admitting: Cardiology

## 2015-11-14 ENCOUNTER — Encounter: Payer: Self-pay | Admitting: Cardiology

## 2015-11-14 VITALS — BP 113/84 | HR 87 | Ht 72.0 in | Wt 176.6 lb

## 2015-11-14 DIAGNOSIS — E785 Hyperlipidemia, unspecified: Secondary | ICD-10-CM

## 2015-11-14 DIAGNOSIS — I2109 ST elevation (STEMI) myocardial infarction involving other coronary artery of anterior wall: Secondary | ICD-10-CM

## 2015-11-14 DIAGNOSIS — I2510 Atherosclerotic heart disease of native coronary artery without angina pectoris: Secondary | ICD-10-CM | POA: Diagnosis not present

## 2015-11-14 DIAGNOSIS — I2119 ST elevation (STEMI) myocardial infarction involving other coronary artery of inferior wall: Secondary | ICD-10-CM | POA: Diagnosis not present

## 2015-11-14 DIAGNOSIS — I255 Ischemic cardiomyopathy: Secondary | ICD-10-CM

## 2015-11-14 DIAGNOSIS — Z9861 Coronary angioplasty status: Secondary | ICD-10-CM

## 2015-11-14 DIAGNOSIS — I10 Essential (primary) hypertension: Secondary | ICD-10-CM

## 2015-11-14 DIAGNOSIS — I208 Other forms of angina pectoris: Secondary | ICD-10-CM

## 2015-11-14 DIAGNOSIS — I5189 Other ill-defined heart diseases: Secondary | ICD-10-CM

## 2015-11-14 MED ORDER — RANOLAZINE ER 1000 MG PO TB12: 1000.0000 mg | ORAL_TABLET | Freq: Two times a day (BID) | ORAL | 11 refills | Status: DC

## 2015-11-14 MED ORDER — ISOSORBIDE MONONITRATE ER 30 MG PO TB24: 30.0000 mg | ORAL_TABLET | ORAL | 3 refills | Status: DC | PRN

## 2015-11-14 MED ORDER — ASPIRIN 81 MG PO TBEC: 81.0000 mg | DELAYED_RELEASE_TABLET | Freq: Every day | ORAL | Status: DC

## 2015-11-14 NOTE — Progress Notes (Signed)
PCP: Webb Silversmith, NP  Clinic Note: Chief Complaint  Patient presents with  . Follow-up    sometimes feels light headed and dizzy   . Chest Pain    pt states some discomfort feels like a tightness but the pain goes away without taking anything, also states the discomfort is not on a level were he feels the need to take anything   . Shortness of Breath    some SOB   . Edema    no edema     HPI: Troy Parrish is a 55 y.o. male with a PMH below who presents today for Four-month follow-up of CAD-PCI with prior instillation MI. I last saw him in February 2016, but he saw Tarri Fuller since then. His most recent intervention was back in October of 2015 for persistent class III exertional dyspnea and chest pain. Perform PCI of the distal RCA with PTCA of both the posterolateral branches.  Troy Parrish was last seen in clinic by me on 06/16/2015. He was feeling well at that time. -- he stopped taking his medicaUnfortunately last saw him back in August, he had stopped taking his medications and was feeling quite poor Recent Hospitalizations:  Admitted to [facility] overnight from July 21 to the 22nd complaints of chest pain and pressure radiating to his left arm apparently for the last 2 months,  -- at that time and also stopped his medicines.  Studies Reviewed: Past surgical history updated  no new studies performed.   Interval History: Troy Parrish presents today for follow-up. He is doing much better now because he is back on his medications. He is much less stressed out. No exertional dyspnea unless he really overdoes it. He says his anginal symptoms are pretty well controlled usually only occur if he exerts himself beyond a certain point, but is able to do more than that. He is hoping that he doesn't have to take Imdur because he gets a headache. Right now is little short of breath as he has an allergy symptoms, but has been taking some over-the-counter treatment for that and has been doing  better.  Now he is back on his medicines, he is back doing things with his family visiting grandkids etc. Much happier and much better mood. He still denies any PND, orthopnea or edema.  No palpitations, but does occasionally feel some lightheadedness & dizziness,  No localized weakness syncope/near syncope, or TIA/amaurosis fugax symptoms. No melena, hematochezia, hematuria, or epstaxis. No claudication.  ROS: A comprehensive was performed. Review of Systems  Constitutional: Positive for weight loss (Intentional). Negative for chills, fever and malaise/fatigue.  HENT: Negative for congestion and nosebleeds.   Respiratory: Positive for cough (Still has some morning cough, having a hard time taking the smoking habit. Currently worse because of allergies), shortness of breath (Baseline shortness of breath. - Not any worse) and wheezing (Not as much. Usually exacerbated by the pollen however.).   Cardiovascular: Negative for palpitations and claudication.  Gastrointestinal: Negative for blood in stool, heartburn (Certain foods.) and melena.  Genitourinary: Negative for hematuria.  Musculoskeletal: Positive for back pain and joint pain. Negative for falls and myalgias.  Neurological: Negative for headaches.  Endo/Heme/Allergies: Bruises/bleeds easily.  Psychiatric/Behavioral: Negative for depression and memory loss. The patient has insomnia (He does have insomnia, but is notably improved.).        Overall his psychological status is much improved. He is pretty much come to grips with his condition and is making most of it. He  is happier now that he has been a long time. Looking forward to doing things with his wife and children. Also grandchildren  All other systems reviewed and are negative.    Past Medical History:  Diagnosis Date  . CAD S/P percutaneous coronary angioplasty 07/07/2011; 6 & 10 2015   S/P PCI to all 3 major vessels;a)  Ant STEMI 2001- BMS-LAD x2 -->(redo PCI 6/'15 -- pLAD  Xience DES 2.5 x 12- 2.75 mm, mLAD 2.25 x 12 - 2.7 mm), b) '06 UA --> Cx- OM DES; c) 2013 Inf MI BMS mRCA --> d) 10/'15 PCI dRCA Promus P DES 4.0 x 16 (4.25 mm); PTCA of RPL2 (2.0 mm) &RPDA (2.25 mm) 11/2013  . Chronic bronchitis (HCC) "used to get it q yr"  . COPD (chronic obstructive pulmonary disease) (HCC)   . Elevated WBC count   . Emphysema of lung (HCC)   . Essential hypertension 07/07/2011  . Former heavy tobacco smoker     quit in May 2015 after multiple attempts at trying to quit before   . GERD (gastroesophageal reflux disease)   . Glucose intolerance (pre-diabetes) June 2015    hemoglobin A1c 6.6  . Headache    "Imdur related; stopped taking it; headaches went away" (11/15/2013)  . Heart murmur   . History of colon polyps   . History of stomach ulcers   . Hyperlipidemia with target LDL less than 70 07/07/2011  . Metabolic syndrome    Pre-diabetes, hypertension and truncal obesity as well as dyslipidemia  . OSA on CPAP   . Pneumonia "several times"  . Recurrent upper respiratory infection (URI)   . Seizures (HCC) "several"   "last one was ~ 2011" (11/15/2013)  . Shortness of breath dyspnea   . ST elevation myocardial infarction (STEMI) of anterior wall (HCC) 2001   History of -- 2 stents in early and distal mid LAD; prior cardiologist was Dr. Derwood Kaplan in [place]  . ST elevation myocardial infarction (STEMI) of inferior wall (HCC) 07/07/2011   Occluded RCA 2.75X18 INTEGRITY; Echo 07/2013: EF 45-50%, Inferior & Posterolateral HK.   Marland Kitchen Umbilical hernia   . Unstable angina (HCC)  2006; June 2015   Cx-OM - PCI 2.5 mm 13 mm Cypher DES Jinny Blossom, [place]) ; 07/2013: Severe ISR of both prox & Distal LAD stentS --> 2 DES stents (1 at prox edge of the proximal stent, 2nd covers the entire distal stent as well as proximal and distal edge stenose)   . Upper respiratory infection     Past Surgical History:  Procedure Laterality Date  . Cardiac MRI [facility]  09/2014   EF 51%. Mod HK of  basal-mid Inf wall, mild HK of basal inferoseptum & basal-mid inferolateral wall with hyper enhancement (c/w subendocard RCA MI with significant viability), focal hyper enhancement of apical wall c/w dLAD subendocard MI & complete LAD viability. . No aortic stenosis. Normal RV function. No Ischemia on Adenosine Stress.  . COLONOSCOPY WITH PROPOFOL N/A 03/28/2015   Procedure: COLONOSCOPY WITH PROPOFOL;  Surgeon: Midge Minium, MD;  Location: [facility] SURGERY CNTR;  Service: Endoscopy;  Laterality: N/A;  . CORONARY ANGIOPLASTY WITH STENT PLACEMENT  2001   ANTERIOR mi with a  PCI to LAD; [place], [place] - Dr. Elson Clan  . CORONARY ANGIOPLASTY WITH STENT PLACEMENT  2006   [place] by Dr Elson Clan -lesion lft circ/OM1 with 2.5 x 51mm Cypher DES  . ESOPHAGOGASTRODUODENOSCOPY (EGD) WITH PROPOFOL N/A 03/28/2015   Procedure: ESOPHAGOGASTRODUODENOSCOPY (EGD) WITH PROPOFOL;  Surgeon:  Midge Minium, MD;  Location: [facility] SURGERY CNTR;  Service: Endoscopy;  Laterality: N/A;  . INSERTION OF MESH N/A 04/02/2015   Procedure: INSERTION OF MESH;  Surgeon: Lattie Haw, MD;  Location: [facility] ORS;  Service: General;  Laterality: N/A;  . LEFT AND RIGHT HEART CATHETERIZATION WITH CORONARY ANGIOGRAM N/A 07/30/2013   Procedure: LEFT AND RIGHT HEART CATHETERIZATION WITH CORONARY ANGIOGRAM;  Surgeon: Marykay Lex, MD;  Location: [facility] CATH LAB;  Service: Cardiovascular;  2 separate P & mLAD lesions -> PCI, MOd-Severe dRCA disesase with diffuse RPL/PDA disease (FFR)  . LEFT HEART CATH  08/03/2013   Procedure: LEFT HEART CATH;  Surgeon: Marykay Lex, MD;  Location: [facility] CATH LAB;  Service: Cardiovascular;;FFR of RCA - non-flow-limiting  . LEFT HEART CATHETERIZATION WITH CORONARY ANGIOGRAM N/A 07/07/2011   Procedure: LEFT HEART CATHETERIZATION WITH CORONARY ANGIOGRAM;  Surgeon: Marykay Lex, MD;  Location: [facility] C[facility] LAB;  Service: Cardiovascular;  inferolat post LV infarct ST-elevation MI  . MET/CPET  11/09/2011   submax. effort 1.05 RER  peak V02 was 51% ,chronotropic incomp.hrt rate lows 80s to 100  . MET/CPET  September 2016   [facility]: Mild functional impairment due primarily to mild pulmonary and circulatory limitations. Also suggest physical deconditioning (seen with low-normal aerobic reserve). Mild VQ mismatch with exercise.  Ventilatory reserve exhausted with peak exercise demonstrated pulmonary limitation. No significant decrease in postexercise FEV1 compared to rest --> suggest no exercise induced bronchospasm  . NM MYOVIEW ([facility] HX)  11/30/2011   EF 45% ,exercise 10 METS, infarct\scar w mild perinfarct ischemia -basal inferolat and mid inferolat region  . PERCUTANEOUS CORONARY STENT INTERVENTION (PCI-S)  07/07/2011   Procedure: PERCUTANEOUS CORONARY STENT INTERVENTION (PCI-S);  Surgeon: Marykay Lex, MD;  Location: [facility] CATH LAB;  Service: Cardiovascular;;occluded RCA ,Integrity Resolute DES 2.75 x 18 mm stent - post-dilated 3.1 mm  . PERCUTANEOUS CORONARY STENT INTERVENTION (PCI-S)  07/30/2013   Procedure: PERCUTANEOUS CORONARY STENT INTERVENTION (PCI-S);  Surgeon: Marykay Lex, MD;  Location: [facility] CATH LAB;  Service: Cardiovascular;;Distal mid LAD: Xience Alpine DES 2.25 mm x 28 mm (overlapping proximal and distal edge of previous stent) - 2.7 mm; proximal LAD 2.5 mm x 12 mm Xience Alpine DES (2.8 mm);; RCA 60-70% stenosis planned staged procedure  . PERCUTANEOUS CORONARY STENT INTERVENTION (PCI-S) N/A 11/15/2013   Procedure: PERCUTANEOUS CORONARY STENT INTERVENTION (PCI-S);  Surgeon: Marykay Lex, MD;  Location: [facility] CATH LAB;  Service: Cardiovascular;  Patent Cx & LAD Stents; PCI -dRCA Promus Premier DES 4.0 mm x 16 mm (4.25 mm) dRCA, 2.0 mm Cutting PTCA of RPL2 [place] & POBA of mRPDA  . POLYPECTOMY  03/28/2015   Procedure: POLYPECTOMY;  Surgeon: Midge Minium, MD;  Location: [facility] SURGERY CNTR;  Service: Endoscopy;;  . RIGHT HEART CATH  June 2015   Normal pressures with severely reduced cardiac output and index. (3.25/1.55)    . TRANSTHORACIC ECHOCARDIOGRAM  07/31/2013; February 2017   a. LVEF 45-50. Inferior posterior hypokinesis with mild dilation. Apparent normal diastolic pressures;   . UMBILICAL HERNIA REPAIR N/A 04/02/2015   Procedure: HERNIA REPAIR UMBILICAL ADULT;  Surgeon: Lattie Haw, MD;  Location: [facility] ORS;  Service: General;  Laterality: N/A;   Meds -- has not restarted meds Prior to Admission medications   Medication Sig Start Date End Date Taking? Authorizing Provider  albuterol (PROVENTIL HFA) 108 (90 Base) MCG/ACT inhaler Inhale 1 puff into the lungs every 6 (six) hours as needed. 06/24/15  Yes Allegra Grana, FNP  aspirin 81 MG EC tablet Take 2 tablets (162 mg total) by mouth at bedtime. 09/10/15  Yes Leonie Man, MD  clobetasol cream (TEMOVATE) 2.53 % APPLY 1 APPLICATION TOPICALLY 2 (TWO) TIMES DAILY. 09/03/15  Yes Jearld Fenton, NP  isosorbide mononitrate (IMDUR) 30 MG 24 hr tablet Take 1 tablet (30 mg total) by mouth daily. 09/10/15  Yes Leonie Man, MD  losartan (COZAAR) 25 MG tablet Take 1 tablet (25 mg total) by mouth daily. 10/23/15  Yes Leonie Man, MD  metoprolol succinate (TOPROL-XL) 25 MG 24 hr tablet Take 1 tablet (25 mg total) by mouth daily. Take with or immediately following a meal. 10/23/15  Yes Leonie Man, MD  nitroGLYCERIN (NITROSTAT) 0.4 MG SL tablet Place 1 tablet (0.4 mg total) under the tongue every 5 (five) minutes as needed for chest pain. 09/10/15  Yes Leonie Man, MD  omeprazole (PRILOSEC) 20 MG capsule Take 1 capsule (20 mg total) by mouth daily. 10/29/15  Yes Jearld Fenton, NP  prasugrel (EFFIENT) 10 MG TABS tablet Take 1 tablet (10 mg total) by mouth daily. 09/22/15  Yes Leonie Man, MD  ranolazine (RANEXA) 500 MG 12 hr tablet Take 1 tablet (500 mg total) by mouth 2 (two) times daily. 09/10/15  Yes Leonie Man, MD  rosuvastatin (CRESTOR) 20 MG tablet Take 1 tablet (20 mg total) by mouth daily. 09/10/15  Yes Leonie Man, MD      Allergies   Allergen Reactions  . Niacin And Related Hives and Itching    Social History   Social History  . Marital status: Married    Spouse name: N/A  . Number of children: N/A  . Years of education: N/A   Social History Main Topics  . Smoking status: Current Every Day Smoker    Packs/day: 1.00    Years: 40.00    Types: Cigarettes    Last attempt to quit: 06/19/2013  . Smokeless tobacco: Never Used     Comment: Quit with Bupropion '150mg'$  - > unfortunately, he restarted  . Alcohol use 0.6 oz/week    1 Cans of beer per week     Comment: rare  . Drug use: No  . Sexual activity: Yes   Other Topics Concern  . None   Social History Narrative   He is a married father of 37, grandfather of 23. He does not really get routine exercise.    He is down to 5 or 6 cigarettes a day. He is very seriously wanting to quit. This is a significant cutback for him, but he has pretty much been told he needs to stop by his wife, and so he fully intends to do so. He is willing to try the patches or whatever. He has a social alcoholic beverage every now and then.    Family History family history includes Cancer in his maternal aunt; Coronary artery disease in his father; Diabetes in his maternal grandfather, maternal grandmother, and mother; Emphysema in his maternal grandfather; Heart disease in his father, paternal grandfather, and paternal grandmother; Hyperlipidemia in his maternal grandfather, maternal grandmother, and mother; Hypertension in his father; Stroke in his father.   Wt Readings from Last 3 Encounters:  11/14/15 80.1 kg (176 lb 9.6 oz)  09/10/15 79.3 kg (174 lb 12 oz)  09/03/15 78.9 kg (174 lb)    PHYSICAL EXAM BP 113/84   Pulse 87   Ht 6' (1.829 m)   Wt 80.1 kg (176 lb  9.6 oz)   SpO2 97%   BMI 23.95 kg/m  General appearance: alert, cooperative, appears stated age, he no longer seems depressed. He actually has more color than usual today and in somewhat better spirits. Well-nourished and  well-groomed  HEENT: [place]/AT, EOMI, MMM, anicteric sclera Neck: no adenopathy, no carotid bruit, no JVD and supple, symmetrical, trachea midline  Lungs: diminished breath sounds bilaterally, wheezes bilaterally and Otherwise the most part CPAP. Nonlabored. Increased AP diameter. Prolonged expiratory phase  Heart: RRR with ectopy, S1, S2 normal, no murmur, click, rub or gallop and normal apical impulse  Abdomen: soft, non-tender; bowel sounds normal; no masses, no organomegaly and Mildly protuberant  Extremities: extremities normal, atraumatic, no cyanosis or edema  Pulses: 2+ and symmetric  Neurologic: Mental status: Alert, oriented, thought content appropriate, affect: blunted and mood-congruent     Adult ECG Report Not checked  Other studies Reviewed: Additional studies/ records that were reviewed today include:  Recent Labs:   Lab Results  Component Value Date   CHOL 196 09/03/2015   HDL 33.80 (L) 09/03/2015   LDLDIRECT 109.0 09/03/2015   TRIG (H) 09/03/2015    402.0 Triglyceride is over 400; calculations on Lipids are invalid.   CHOLHDL 6 09/03/2015  -- He had stopped taking niacin and noticed this change since. Also has not been taking his statin.  ASSESSMENT / PLAN: Problem List Items Addressed This Visit    Stable angina (HCC) (Chronic)    Pretty well-controlled now he is back on his medicines. He doesn't really like taking the Imdur because of bad headaches. Next line plan will be to DC Imdur and increase Ranexa to 1000 twice a day. He is on low-dose beta blocker that were not titrating up because of fatigue.  If he really needs a sublingual nitroglycerin, he can and take Imdur for couple days after.      Relevant Medications   ranolazine (RANEXA) 1000 MG SR tablet   isosorbide mononitrate (IMDUR) 30 MG 24 hr tablet   aspirin 81 MG EC tablet   ST elevation myocardial infarction (STEMI) of anterior wall -- PCI to LAD - Primary (Chronic)   Relevant Medications    ranolazine (RANEXA) 1000 MG SR tablet   isosorbide mononitrate (IMDUR) 30 MG 24 hr tablet   aspirin 81 MG EC tablet   ST elevation myocardial infarction (STEMI) of inferior wall (HCC) (Chronic)   Relevant Medications   ranolazine (RANEXA) 1000 MG SR tablet   isosorbide mononitrate (IMDUR) 30 MG 24 hr tablet   aspirin 81 MG EC tablet   Hyperlipidemia with target LDL less than 70; by his report he is back on Crestor (Chronic)    Due for another recheck soon by his PCP. The last labs showed HDL lower than expected, LDL greater than 100 and elevated triglycerides.  He is currently on simvastatin 20 mg daily. We could consider increasing the dose, but I'm scared of him having worsening symptoms. Cost is a major issue for him as far as adding any new medications such as Zetia. With elevated triglycerides, however we could consider fenofibrate pending Labs.      Relevant Medications   ranolazine (RANEXA) 1000 MG SR tablet   isosorbide mononitrate (IMDUR) 30 MG 24 hr tablet   aspirin 81 MG EC tablet   Essential hypertension (Chronic)    Well-controlled today. Continue current management.      Relevant Medications   ranolazine (RANEXA) 1000 MG SR tablet   isosorbide mononitrate (IMDUR) 30 MG 24  hr tablet   aspirin 81 MG EC tablet   Chronotropic incompetence with left ventricular dysfunction --> improved with reduction of beta blocker (Chronic)    Energy is better now. His heart rates are also better control. Only on low-dose beta blocker. Had a lot of fatigue while being on higher dose of beta blocker.      Relevant Medications   ranolazine (RANEXA) 1000 MG SR tablet   isosorbide mononitrate (IMDUR) 30 MG 24 hr tablet   aspirin 81 MG EC tablet   Cardiomyopathy, ischemic; EF~45% with hypokinesis of the inferior and inferolateral myocardium; CO 3.25 (Chronic)    Stable with mildly reduced EF. Class II exertional dyspnea symptoms are pretty controlled.  Remains on losartan and metoprolol  succinate. No diuretic requirement.      Relevant Medications   ranolazine (RANEXA) 1000 MG SR tablet   isosorbide mononitrate (IMDUR) 30 MG 24 hr tablet   aspirin 81 MG EC tablet   CAD S/P percutaneous coronary angioplasty (Chronic)    Multiple PCIS. Continues to be on Effient as he did not do well with Brilinta or Plavix in the past.  He would like to continue taking aspirin, but I think we can reduce 81 mg. Allergies not taking niacin or Imdur, he probably will need as much.      Relevant Medications   ranolazine (RANEXA) 1000 MG SR tablet   isosorbide mononitrate (IMDUR) 30 MG 24 hr tablet   aspirin 81 MG EC tablet    Other Visit Diagnoses   None.     Current medicines are reviewed at length with the patient today. (+/- concerns) - has not restarted meds The following changes have been made:  INCREASE TO RANEXA 1000 MG ONE TABLET TWICE A DAY  MAY USE IMDUR FOR 2 3 DAYS ,IF CHEST DISCOMFORT RETURNS, OTHER WISE USE AS NEEDED.   MAY USE 81 MG ASPIRIN  FOLLOW-UP: 6 MONTH WITH DR HARDING    Studies Ordered:   No orders of the defined types were placed in this encounter.     Glenetta Hew, M.D., M.S. Interventional Cardiologist   Pager # [phone] Phone # [phone] [address]. [place], [place] [address]

## 2015-11-14 NOTE — Patient Instructions (Signed)
INCREASE TO RANEXA 1000 MG ONE TABLET TWICE A DAY  MAY USE IMDUR FOR 2 3 DAYS ,IF CHEST DISCOMFORT RETURNS, OTHER WISE USE AS NEEDED.   MAY USE 81 MG ASPIRIN  Your physician wants you to follow-up in: [address] [place] - 30 MINYou will receive a reminder letter in the mail two months in advance. If you don't receive a letter, please call our office to schedule the follow-up appointment.  If you need a refill on your cardiac medications before your next appointment, please call your pharmacy.

## 2015-11-16 ENCOUNTER — Encounter: Payer: Self-pay | Admitting: Cardiology

## 2015-11-16 NOTE — Assessment & Plan Note (Signed)
Energy is better now. His heart rates are also better control. Only on low-dose beta blocker. Had a lot of fatigue while being on higher dose of beta blocker.

## 2015-11-16 NOTE — Assessment & Plan Note (Signed)
Due for another recheck soon by his PCP. The last labs showed HDL lower than expected, LDL greater than 100 and elevated triglycerides.  He is currently on simvastatin 20 mg daily. We could consider increasing the dose, but I'm scared of him having worsening symptoms. Cost is a major issue for him as far as adding any new medications such as Zetia. With elevated triglycerides, however we could consider fenofibrate pending Labs.

## 2015-11-16 NOTE — Assessment & Plan Note (Signed)
Well-controlled today.  Continue current management. 

## 2015-11-16 NOTE — Assessment & Plan Note (Signed)
Stable with mildly reduced EF. Class II exertional dyspnea symptoms are pretty controlled.  Remains on losartan and metoprolol succinate. No diuretic requirement.

## 2015-11-16 NOTE — Assessment & Plan Note (Signed)
Multiple PCIS. Continues to be on Effient as he did not do well with Brilinta or Plavix in the past.  He would like to continue taking aspirin, but I think we can reduce 81 mg. Allergies not taking niacin or Imdur, he probably will need as much.

## 2015-11-16 NOTE — Assessment & Plan Note (Addendum)
Pretty well-controlled now he is back on his medicines. He doesn't really like taking the Imdur because of bad headaches. Next line plan will be to DC Imdur and increase Ranexa to 1000 twice a day. He is on low-dose beta blocker that were not titrating up because of fatigue.  If he really needs a sublingual nitroglycerin, he can and take Imdur for couple days after.

## 2015-12-03 ENCOUNTER — Telehealth: Payer: Self-pay | Admitting: Internal Medicine

## 2015-12-03 ENCOUNTER — Other Ambulatory Visit: Payer: Self-pay | Admitting: Internal Medicine

## 2015-12-03 MED ORDER — ALBUTEROL SULFATE HFA 108 (90 BASE) MCG/ACT IN AERS: 1.0000 | INHALATION_SPRAY | Freq: Four times a day (QID) | RESPIRATORY_TRACT | 2 refills | Status: DC | PRN

## 2015-12-03 NOTE — Telephone Encounter (Signed)
Refilled

## 2015-12-03 NOTE — Telephone Encounter (Signed)
Pt called [employer] stating that he has been trying to call over to get a refill on a medication and has not gotten a call back, and he has left several messages. Pt needs a refill on albuterol (PROVENTIL HFA) 108 (90 Base) MCG/ACT inhaler, he is completely of it.   Pharmacy - [id number] - [place], [place]  Call pt @ [phone]

## 2015-12-04 ENCOUNTER — Ambulatory Visit (INDEPENDENT_AMBULATORY_CARE_PROVIDER_SITE_OTHER): Payer: 59 | Admitting: Internal Medicine

## 2015-12-04 ENCOUNTER — Encounter: Payer: Self-pay | Admitting: Internal Medicine

## 2015-12-04 VITALS — BP 116/84 | HR 74 | Temp 97.6°F | Wt 179.0 lb

## 2015-12-04 DIAGNOSIS — J069 Acute upper respiratory infection, unspecified: Secondary | ICD-10-CM | POA: Diagnosis not present

## 2015-12-04 DIAGNOSIS — B9789 Other viral agents as the cause of diseases classified elsewhere: Secondary | ICD-10-CM

## 2015-12-04 MED ORDER — AZITHROMYCIN 250 MG PO TABS: ORAL_TABLET | ORAL | 0 refills | Status: DC

## 2015-12-04 NOTE — Patient Instructions (Signed)
Upper Respiratory Infection, Adult Most upper respiratory infections (URIs) are a viral infection of the air passages leading to the lungs. A URI affects the nose, throat, and upper air passages. The most common type of URI is nasopharyngitis and is typically referred to as "the common cold." URIs run their course and usually go away on their own. Most of the time, a URI does not require medical attention, but sometimes a bacterial infection in the upper airways can follow a viral infection. This is called a secondary infection. Sinus and middle ear infections are common types of secondary upper respiratory infections. Bacterial pneumonia can also complicate a URI. A URI can worsen asthma and chronic obstructive pulmonary disease (COPD). Sometimes, these complications can require emergency medical care and may be life threatening.  CAUSES Almost all URIs are caused by viruses. A virus is a type of germ and can spread from one person to another.  RISKS FACTORS You may be at risk for a URI if:   You smoke.   You have chronic heart or lung disease.  You have a weakened defense (immune) system.   You are very young or very old.   You have nasal allergies or asthma.  You work in crowded or poorly ventilated areas.  You work in health care facilities or schools. SIGNS AND SYMPTOMS  Symptoms typically develop 2-3 days after you come in contact with a cold virus. Most viral URIs last 7-10 days. However, viral URIs from the influenza virus (flu virus) can last 14-18 days and are typically more severe. Symptoms may include:   Runny or stuffy (congested) nose.   Sneezing.   Cough.   Sore throat.   Headache.   Fatigue.   Fever.   Loss of appetite.   Pain in your forehead, behind your eyes, and over your cheekbones (sinus pain).  Muscle aches.  DIAGNOSIS  Your health care provider may diagnose a URI by:  Physical exam.  Tests to check that your symptoms are not due to  another condition such as:  Strep throat.  Sinusitis.  Pneumonia.  Asthma. TREATMENT  A URI goes away on its own with time. It cannot be cured with medicines, but medicines may be prescribed or recommended to relieve symptoms. Medicines may help:  Reduce your fever.  Reduce your cough.  Relieve nasal congestion. HOME CARE INSTRUCTIONS   Take medicines only as directed by your health care provider.   Gargle warm saltwater or take cough drops to comfort your throat as directed by your health care provider.  Use a warm mist humidifier or inhale steam from a shower to increase air moisture. This may make it easier to breathe.  Drink enough fluid to keep your urine clear or pale yellow.   Eat soups and other clear broths and maintain good nutrition.   Rest as needed.   Return to work when your temperature has returned to normal or as your health care provider advises. You may need to stay home longer to avoid infecting others. You can also use a face mask and careful hand washing to prevent spread of the virus.  Increase the usage of your inhaler if you have asthma.   Do not use any tobacco products, including cigarettes, chewing tobacco, or electronic cigarettes. If you need help quitting, ask your health care provider. PREVENTION  The best way to protect yourself from getting a cold is to practice good hygiene.   Avoid oral or hand contact with people with cold   symptoms.   Wash your hands often if contact occurs.  There is no clear evidence that vitamin C, vitamin E, echinacea, or exercise reduces the chance of developing a cold. However, it is always recommended to get plenty of rest, exercise, and practice good nutrition.  SEEK MEDICAL CARE IF:   You are getting worse rather than better.   Your symptoms are not controlled by medicine.   You have chills.  You have worsening shortness of breath.  You have brown or red mucus.  You have yellow or brown nasal  discharge.  You have pain in your face, especially when you bend forward.  You have a fever.  You have swollen neck glands.  You have pain while swallowing.  You have white areas in the back of your throat. SEEK IMMEDIATE MEDICAL CARE IF:   You have severe or persistent:  Headache.  Ear pain.  Sinus pain.  Chest pain.  You have chronic lung disease and any of the following:  Wheezing.  Prolonged cough.  Coughing up blood.  A change in your usual mucus.  You have a stiff neck.  You have changes in your:  Vision.  Hearing.  Thinking.  Mood. MAKE SURE YOU:   Understand these instructions.  Will watch your condition.  Will get help right away if you are not doing well or get worse.   This information is not intended to replace advice given to you by your health care provider. Make sure you discuss any questions you have with your health care provider.   Document Released: 07/21/2000 Document Revised: 06/11/2014 Document Reviewed: 05/02/2013 Elsevier Interactive Patient Education 2016 Elsevier Inc.  

## 2015-12-04 NOTE — Progress Notes (Signed)
HPI  Pt presents to the clinic today with c/o runny nose and sore throat. He reports this started yesterday. He is blowing yellow mucous out of his nose. He denies difficulty swallowing. He denies fever, chills or body aches. He has tried Dayquil and Nyquil with minimal relief. He has a history of COPD and DM 2. He has had sick contacts. He is leaving tomorrow to travel to [place].  Review of Systems      Past Medical History:  Diagnosis Date  . CAD S/P percutaneous coronary angioplasty 07/07/2011; 6 & 10 2015   S/P PCI to all 3 major vessels;a)  Ant STEMI 2001- BMS-LAD x2 -->(redo PCI 6/'15 -- pLAD Xience DES 2.5 x 12- 2.75 mm, mLAD 2.25 x 12 - 2.7 mm), b) '06 UA --> Cx- OM DES; c) 2013 Inf MI BMS mRCA --> d) 10/'15 PCI dRCA Promus P DES 4.0 x 16 (4.25 mm); PTCA of RPL2 (2.0 mm) &RPDA (2.25 mm) 11/2013  . Chronic bronchitis ([place]) "used to get it q yr"  . COPD (chronic obstructive pulmonary disease) ([place])   . Elevated WBC count   . Emphysema of lung ([place])   . Essential hypertension 07/07/2011  . Former heavy tobacco smoker     quit in May 2015 after multiple attempts at trying to quit before   . GERD (gastroesophageal reflux disease)   . Glucose intolerance (pre-diabetes) June 2015    hemoglobin A1c 6.6  . Headache    "Imdur related; stopped taking it; headaches went away" (11/15/2013)  . Heart murmur   . History of colon polyps   . History of stomach ulcers   . Hyperlipidemia with target LDL less than 70 07/07/2011  . Metabolic syndrome    Pre-diabetes, hypertension and truncal obesity as well as dyslipidemia  . OSA on CPAP   . Pneumonia "several times"  . Recurrent upper respiratory infection (URI)   . Seizures ([place]) "several"   "last one was ~ 2011" (11/15/2013)  . Shortness of breath dyspnea   . ST elevation myocardial infarction (STEMI) of anterior wall ([place]) 2001   History of -- 2 stents in early and distal mid LAD; prior cardiologist was Dr. Remi Haggard in [place]  . ST  elevation myocardial infarction (STEMI) of inferior wall ([place]) 07/07/2011   Occluded RCA [id number] INTEGRITY; Echo 07/2013: EF 45-50%, Inferior & Posterolateral HK.   Marland Kitchen Umbilical hernia   . Unstable angina ([place])  2006; June 2015   Cx-OM - PCI 2.5 mm 13 mm Cypher DES [name], [place]) ; 07/2013: Severe ISR of both prox & Distal LAD stentS --> 2 DES stents (1 at prox edge of the proximal stent, 2nd covers the entire distal stent as well as proximal and distal edge stenose)   . Upper respiratory infection     Family History  Problem Relation Age of Onset  . Coronary artery disease Father   . Heart disease Father   . Stroke Father   . Hypertension Father   . Hyperlipidemia Mother   . Diabetes Mother   . Hyperlipidemia Maternal Grandmother   . Diabetes Maternal Grandmother   . Hyperlipidemia Maternal Grandfather   . Diabetes Maternal Grandfather   . Emphysema Maternal Grandfather   . Heart disease Paternal Grandmother   . Heart disease Paternal Grandfather   . Cancer Maternal Aunt     breast    Social History   Social History  . Marital status: Married    Spouse name: N/A  . Number  of children: N/A  . Years of education: N/A   Occupational History  . Not on file.   Social History Main Topics  . Smoking status: Current Every Day Smoker    Packs/day: 1.00    Years: 40.00    Types: Cigarettes    Last attempt to quit: 06/19/2013  . Smokeless tobacco: Never Used     Comment: Quit with Bupropion 150mg  - > unfortunately, he restarted  . Alcohol use 0.6 oz/week    1 Cans of beer per week     Comment: rare  . Drug use: No  . Sexual activity: Yes   Other Topics Concern  . Not on file   Social History Narrative   He is a married father of 66, grandfather of 3. He does not really get routine exercise.    He is down to 5 or 6 cigarettes a day. He is very seriously wanting to quit. This is a significant cutback for him, but he has pretty much been told he needs to stop by his wife, and  so he fully intends to do so. He is willing to try the patches or whatever. He has a social alcoholic beverage every now and then.     Allergies  Allergen Reactions  . Niacin And Related Hives and Itching     Constitutional: Denies headache, fatigue, fever or abrupt weight changes.  HEENT:  Positive runny nose, sore throat. Denies eye redness, eye pain, pressure behind the eyes, facial pain, nasal congestion, ear pain, ringing in the ears, wax buildup, or bloody nose. Respiratory: Denies cough, difficulty breathing or shortness of breath.  Cardiovascular: Denies chest pain, chest tightness, palpitations or swelling in the hands or feet.   No other specific complaints in a complete review of systems (except as listed in HPI above).  Objective:   BP 116/84   Pulse 74   Temp 97.6 F (36.4 C) (Oral)   Wt 179 lb (81.2 kg)   SpO2 95%   BMI 24.28 kg/m  Wt Readings from Last 3 Encounters:  12/04/15 179 lb (81.2 kg)  11/14/15 176 lb 9.6 oz (80.1 kg)  09/10/15 174 lb 12 oz (79.3 kg)     General: Appears his stated age, well developed, well nourished in NAD. HEENT: Head: normal shape and size, no sinus tenderness noted; Eyes: sclera white, no icterus, conjunctiva pink; Ears: Tm's gray and intact, normal light reflex; Nose: mucosa pink and moist, septum midline; Throat/Mouth: + PND. Teeth present, mucosa erythematous and moist, no exudate noted, no lesions or ulcerations noted.  Neck: No cervical lymphadenopathy.  Cardiovascular: Normal rate and rhythm. S1,S2 noted.  Pulmonary/Chest: Normal effort and positive vesicular breath sounds. No respiratory distress. No wheezes, rales or ronchi noted.      Assessment & Plan:   Viral Upper Respiratory Infection:  Get some rest and drink plenty of water Do salt water gargles for the sore throat Printed Rx for Azithromax x 5 days to start taking if feeling worse while in [place] as needed for cough  RTC as needed or if symptoms  persist.   Webb Silversmith, NP

## 2015-12-17 ENCOUNTER — Other Ambulatory Visit: Payer: Self-pay

## 2015-12-18 ENCOUNTER — Encounter: Payer: Self-pay | Admitting: Surgery

## 2015-12-18 ENCOUNTER — Ambulatory Visit (INDEPENDENT_AMBULATORY_CARE_PROVIDER_SITE_OTHER): Payer: 59 | Admitting: Surgery

## 2015-12-18 VITALS — BP 132/97 | HR 95 | Temp 98.7°F | Wt 180.0 lb

## 2015-12-18 DIAGNOSIS — K429 Umbilical hernia without obstruction or gangrene: Secondary | ICD-10-CM

## 2015-12-18 NOTE — Progress Notes (Signed)
Outpatient Surgical Follow Up  12/18/2015  Troy Parrish is an 55 y.o. male.   CC: Umbilical mass  HPI: This patient status post umbilical hernia repair with mesh placed back in February 2017. He returns today with complaints of a mass in the area. He thinks that this is a suture but has never noticed any drainage or redness. It causes him no pain. He saw his cardiologist to confirm that it was a suture.  Past Medical History:  Diagnosis Date  . CAD S/P percutaneous coronary angioplasty 07/07/2011; 6 & 10 2015   S/P PCI to all 3 major vessels;a)  Ant STEMI 2001- BMS-LAD x2 -->(redo PCI 6/'15 -- pLAD Xience DES 2.5 x 12- 2.75 mm, mLAD 2.25 x 12 - 2.7 mm), b) '06 UA --> Cx- OM DES; c) 2013 Inf MI BMS mRCA --> d) 10/'15 PCI dRCA Promus P DES 4.0 x 16 (4.25 mm); PTCA of RPL2 (2.0 mm) &RPDA (2.25 mm) 11/2013  . Chronic bronchitis ([place]) "used to get it q yr"  . COPD (chronic obstructive pulmonary disease) ([place])   . Elevated WBC count   . Emphysema of lung ([place])   . Essential hypertension 07/07/2011  . Former heavy tobacco smoker     quit in May 2015 after multiple attempts at trying to quit before   . GERD (gastroesophageal reflux disease)   . Glucose intolerance (pre-diabetes) June 2015    hemoglobin A1c 6.6  . Headache    "Imdur related; stopped taking it; headaches went away" (11/15/2013)  . Heart murmur   . History of colon polyps   . History of stomach ulcers   . Hyperlipidemia with target LDL less than 70 07/07/2011  . Metabolic syndrome    Pre-diabetes, hypertension and truncal obesity as well as dyslipidemia  . OSA on CPAP   . Pneumonia "several times"  . Recurrent upper respiratory infection (URI)   . Seizures ([place]) "several"   "last one was ~ 2011" (11/15/2013)  . Shortness of breath dyspnea   . ST elevation myocardial infarction (STEMI) of anterior wall ([place]) 2001   History of -- 2 stents in early and distal mid LAD; prior cardiologist was Dr. Remi Haggard in [place]  . ST  elevation myocardial infarction (STEMI) of inferior wall ([place]) 07/07/2011   Occluded RCA 8.14G81 INTEGRITY; Echo 07/2013: EF 45-50%, Inferior & Posterolateral HK.   Marland Kitchen Umbilical hernia   . Unstable angina ([place])  2006; June 2015   Cx-OM - PCI 2.5 mm 13 mm Cypher DES [name], [place]) ; 07/2013: Severe ISR of both prox & Distal LAD stentS --> 2 DES stents (1 at prox edge of the proximal stent, 2nd covers the entire distal stent as well as proximal and distal edge stenose)   . Upper respiratory infection     Past Surgical History:  Procedure Laterality Date  . Cardiac MRI [facility]  09/2014   EF 51%. Mod HK of basal-mid Inf wall, mild HK of basal inferoseptum & basal-mid inferolateral wall with hyper enhancement (c/w subendocard RCA MI with significant viability), focal hyper enhancement of apical wall c/w dLAD subendocard MI & complete LAD viability. . No aortic stenosis. Normal RV function. No Ischemia on Adenosine Stress.  . COLONOSCOPY WITH PROPOFOL N/A 03/28/2015   Procedure: COLONOSCOPY WITH PROPOFOL;  Surgeon: Lucilla Lame, MD;  Location: [facility];  Service: Endoscopy;  Laterality: N/A;  . CORONARY ANGIOPLASTY WITH STENT PLACEMENT  2001   ANTERIOR mi with a  PCI to LAD; [place], [place] -  Dr. Dorris Fetch  . CORONARY ANGIOPLASTY WITH STENT PLACEMENT  2006   [place] by Dr Dorris Fetch -lesion lft circ/OM1 with 2.5 x 63m Cypher DES  . ESOPHAGOGASTRODUODENOSCOPY (EGD) WITH PROPOFOL N/A 03/28/2015   Procedure: ESOPHAGOGASTRODUODENOSCOPY (EGD) WITH PROPOFOL;  Surgeon: DLucilla Lame MD;  [facility]  Service: Endoscopy;  Laterality: N/A;  . INSERTION OF MESH N/A 04/02/2015   Procedure: INSERTION OF MESH;  Surgeon: RFlorene Glen MD;  Location: [facility];  Service: General;  Laterality: N/A;  . LEFT AND RIGHT HEART CATHETERIZATION WITH CORONARY ANGIOGRAM N/A 07/30/2013   Procedure: LEFT AND RIGHT HEART CATHETERIZATION WITH CORONARY ANGIOGRAM;  Surgeon: DLeonie Man MD;  [facility];  Service: Cardiovascular;  2 separate P & mLAD lesions -> PCI, MOd-Severe dRCA disesase with diffuse RPL/PDA disease (FFR)  . LEFT HEART CATH  08/03/2013   Procedure: LEFT HEART CATH;  Surgeon: DLeonie Man MD;  [facility];  Service: Cardiovascular;;FFR of RCA - non-flow-limiting  . LEFT HEART CATHETERIZATION WITH CORONARY ANGIOGRAM N/A 07/07/2011   Procedure: LEFT HEART CATHETERIZATION WITH CORONARY ANGIOGRAM;  Surgeon: DLeonie Man MD;  [facility];  Service: Cardiovascular;  inferolat post LV infarct ST-elevation MI  . MET/CPET  11/09/2011   submax. effort 1.05 RER peak V02 was 51% ,chronotropic incomp.hrt rate lows 80s to 100  . MET/CPET  September 2016   [facility]: Mild functional impairment due primarily to mild pulmonary and circulatory limitations. Also suggest physical deconditioning (seen with low-normal aerobic reserve). Mild VQ mismatch with exercise.  Ventilatory reserve exhausted with peak exercise demonstrated pulmonary limitation. No significant decrease in postexercise FEV1 compared to rest --> suggest no exercise induced bronchospasm  . NM MYOVIEW ([place])  11/30/2011   EF 45% ,exercise 10 METS, infarct\scar w mild perinfarct ischemia -basal inferolat and mid inferolat region  . PERCUTANEOUS CORONARY STENT INTERVENTION (PCI-S)  07/07/2011   Procedure: PERCUTANEOUS CORONARY STENT INTERVENTION (PCI-S);  Surgeon: DLeonie Man MD;  [facility];  Service: Cardiovascular;;occluded RCA ,Integrity Resolute DES 2.75 x 18 mm stent - post-dilated 3.1 mm  . PERCUTANEOUS CORONARY STENT INTERVENTION (PCI-S)  07/30/2013   Procedure: PERCUTANEOUS CORONARY STENT INTERVENTION (PCI-S);  Surgeon: DLeonie Man MD;  [facility];  Service: Cardiovascular;;Distal mid LAD: Xience Alpine DES 2.25 mm x 28 mm (overlapping proximal and distal edge of previous stent) - 2.7 mm; proximal LAD 2.5 mm x 12 mm Xience Alpine DES (2.8 mm);; RCA 60-70% stenosis planned  staged procedure  . PERCUTANEOUS CORONARY STENT INTERVENTION (PCI-S) N/A 11/15/2013   Procedure: PERCUTANEOUS CORONARY STENT INTERVENTION (PCI-S);  Surgeon: DLeonie Man MD;  [facility];  Service: Cardiovascular;  Patent Cx & LAD Stents; PCI -dRCA Promus Premier DES 4.0 mm x 16 mm (4.25 mm) dRCA, 2.0 mm Cutting PTCA of RPL2 [place] & POBA of mRPDA  . POLYPECTOMY  03/28/2015   Procedure: POLYPECTOMY;  Surgeon: DLucilla Lame MD;  [place] [facility]  Service: Endoscopy;;  . RIGHT HEART CATH  June 2015   Normal pressures with severely reduced cardiac output and index. (3.25/1.55)  . TRANSTHORACIC ECHOCARDIOGRAM  07/31/2013; February 2017   a. LVEF 45-50. Inferior posterior hypokinesis with mild dilation. Apparent normal diastolic pressures;   . UMBILICAL HERNIA REPAIR N/A 04/02/2015   Procedure: HERNIA REPAIR UMBILICAL ADULT;  Surgeon: RFlorene Glen MD;  Location: [facility];  Service: General;  Laterality: N/A;    Family History  Problem Relation  Age of Onset  . Coronary artery disease Father   . Heart disease Father   . Stroke Father   . Hypertension Father   . Hyperlipidemia Mother   . Diabetes Mother   . Hyperlipidemia Maternal Grandmother   . Diabetes Maternal Grandmother   . Hyperlipidemia Maternal Grandfather   . Diabetes Maternal Grandfather   . Emphysema Maternal Grandfather   . Heart disease Paternal Grandmother   . Heart disease Paternal Grandfather   . Cancer Maternal Aunt     breast    Social History:  reports that he has been smoking Cigarettes.  He has a 40.00 pack-year smoking history. He has never used smokeless tobacco. He reports that he drinks about 0.6 oz of alcohol per week . He reports that he does not use drugs.  Allergies:  Allergies  Allergen Reactions  . Niacin And Related Hives and Itching    Medications reviewed.   Review of Systems:   Review of Systems  Constitutional: Negative for chills and fever.  Gastrointestinal:  Negative for abdominal pain.     Physical Exam:  There were no vitals taken for this visit.  Physical Exam  Abdominal: Soft. He exhibits mass. He exhibits no distension. There is no tenderness. There is no rebound.  2 mm raised mass in the umbilicus away from the scar.  The area was prepped and then inspected closely a small segment of Prolene suture was identified elevated and clipped. A dressing was placed.  Skin: Skin is warm and dry. No rash noted. No erythema.      No results found for this or any previous visit (from the past 48 hour(s)). No results found.  Assessment/Plan:  Extruded suture through skin. The tip of the suture was removed and the remainder retracted.  I asked the patient to return should this happen again and then local anesthetic could be utilized to remove the remaining suture material if this were to happen again. We'll follow up on an as-needed basis  Florene Glen, MD, FACS

## 2016-01-12 LAB — HM DIABETES EYE EXAM

## 2016-03-15 ENCOUNTER — Telehealth: Payer: Self-pay | Admitting: Internal Medicine

## 2016-03-15 NOTE — Telephone Encounter (Signed)
Patient Name: Troy Parrish DOB: December 24, 1960 Initial Comment Caller says, has vertigo Sx feels dizzy and hard to focus visually, He also has flu questions as well. Nurse Assessment Nurse: Zorita Pang, RN, Deborah Date/Time (Eastern Time): 03/15/2016 1:53:44 PM Confirm and document reason for call. If symptomatic, describe symptoms. ---The caller states that he has dizzy spells and he has vertigo but it is constant now. He states that he is sleeping a lot today. The caller states that he is not running a fever. He states that he has some nausea when he is up and about. Does the patient have any new or worsening symptoms? ---Yes Will a triage be completed? ---Yes Related visit to physician within the last 2 weeks? ---No Does the PT have any chronic conditions? (i.e. diabetes, asthma, etc.) ---Yes List chronic conditions. ---The caller states that he has CAD with 8 stents. Is this a behavioral health or substance abuse call? ---No Guidelines Guideline Title Affirmed Question Affirmed Notes Dizziness - Lightheadedness [1] MODERATE dizziness (e.g., interferes with normal activities) AND [2] has NOT been evaluated by physician for this (Exception: dizziness caused by heat exposure, sudden standing, or poor fluid intake) Final Disposition User See Physician within 24 Hours Womble, RN, Neoma Laming Comments The caller asked about getting a repeat flu vaccine because he is going to visit relatives in a high flu zone. This nurse told him that he should ask his PCP this question or if he doesn't have to travel that he may want to wait until the flu dissipates some an he verbalizes understanding. Referrals REFERRED TO PCP OFFICE Disagree/Comply: Comply

## 2016-03-15 NOTE — Telephone Encounter (Signed)
Pt has appt with R Baity NP on 03/16/16 at 1:30.

## 2016-03-16 ENCOUNTER — Ambulatory Visit (INDEPENDENT_AMBULATORY_CARE_PROVIDER_SITE_OTHER): Payer: PPO | Admitting: Internal Medicine

## 2016-03-16 ENCOUNTER — Encounter: Payer: Self-pay | Admitting: Internal Medicine

## 2016-03-16 VITALS — BP 116/68 | HR 94 | Temp 98.0°F | Wt 180.0 lb

## 2016-03-16 DIAGNOSIS — R42 Dizziness and giddiness: Secondary | ICD-10-CM

## 2016-03-16 DIAGNOSIS — R002 Palpitations: Secondary | ICD-10-CM

## 2016-03-16 DIAGNOSIS — Z20828 Contact with and (suspected) exposure to other viral communicable diseases: Secondary | ICD-10-CM

## 2016-03-16 MED ORDER — MECLIZINE HCL 25 MG PO TABS: 25.0000 mg | ORAL_TABLET | Freq: Three times a day (TID) | ORAL | 0 refills | Status: DC | PRN

## 2016-03-16 MED ORDER — OSELTAMIVIR PHOSPHATE 75 MG PO CAPS: 75.0000 mg | ORAL_CAPSULE | Freq: Every day | ORAL | 0 refills | Status: DC

## 2016-03-16 NOTE — Patient Instructions (Signed)
Dizziness Dizziness is a common problem. It makes you feel unsteady or lightheaded. You may feel like you are about to pass out (faint). Dizziness can lead to injury if you stumble or fall. Anyone can get dizzy, but dizziness is more common in older adults. This condition can be caused by a number of things, including:  Medicines.  Dehydration.  Illness. Follow these instructions at home: Following these instructions may help with your condition: Eating and drinking  Drink enough fluid to keep your pee (urine) clear or pale yellow. This helps to keep you from getting dehydrated. Try to drink more clear fluids, such as water.  Do not drink alcohol.  Limit how much caffeine you drink or eat if told by your doctor.  Limit how much salt you drink or eat if told by your doctor. Activity  Avoid making quick movements.  When you stand up from sitting in a chair, steady yourself until you feel okay.  In the morning, first sit up on the side of the bed. When you feel okay, stand slowly while you hold onto something. Do this until you know that your balance is fine.  Move your legs often if you need to stand in one place for a long time. Tighten and relax your muscles in your legs while you are standing.  Do not drive or use heavy machinery if you feel dizzy.  Avoid bending down if you feel dizzy. Place items in your home so that they are easy for you to reach without leaning over. Lifestyle  Do not use any tobacco products, including cigarettes, chewing tobacco, or electronic cigarettes. If you need help quitting, ask your doctor.  Try to lower your stress level, such as with yoga or meditation. Talk with your doctor if you need help. General instructions  Watch your dizziness for any changes.  Take medicines only as told by your doctor. Talk with your doctor if you think that your dizziness is caused by a medicine that you are taking.  Tell a friend or a family member that you are  feeling dizzy. If he or she notices any changes in your behavior, have this person call your doctor.  Keep all follow-up visits as told by your doctor. This is important. Contact a doctor if:  Your dizziness does not go away.  Your dizziness or light-headedness gets worse.  You feel sick to your stomach (nauseous).  You have trouble hearing.  You have new symptoms.  You are unsteady on your feet or you feel like the room is spinning. Get help right away if:  You throw up (vomit) or have diarrhea and are unable to eat or drink anything.  You have trouble:  Talking.  Walking.  Swallowing.  Using your arms, hands, or legs.  You feel generally weak.  You are not thinking clearly or you have trouble forming sentences. It may take a friend or family member to notice this.  You have:  Chest pain.  Pain in your belly (abdomen).  Shortness of breath.  Sweating.  Your vision changes.  You are bleeding.  You have a headache.  You have neck pain or a stiff neck.  You have a fever. This information is not intended to replace advice given to you by your health care provider. Make sure you discuss any questions you have with your health care provider. Document Released: 01/14/2011 Document Revised: 07/03/2015 Document Reviewed: 01/21/2014 Elsevier Interactive Patient Education  2017 [employer] Inc[employer]  

## 2016-03-16 NOTE — Progress Notes (Signed)
Subjective:    Patient ID: Troy Parrish, male    DOB: 1960/08/07, 56 y.o.   MRN: [id number]  HPI  Pt presents to the clinic today with c/o dizziness. This has been an intermittent issue for him for years, but seemed more constant over the last 2-3 days. He describes it as a sensation that the room is spinning. He denies visual changes, chest pain, shortness of breath. He reports last week, he did have a "flutter" in his chest that lasted for about 30 minutes and then subsided. He has also felt fatigued, which is not new for him. He has not taken anything OTC for this. He has a history of vertigo, CABG with stents x 8. He follows closely with cardiology. He reports he has not had any symptoms of dizziness today. He has not taken anything OTC for this.  He also reports that he is travelling to [place] next week, and they are currently having a flu outbreak. He did get his flu shot but he was wondering if there was something he could take to prevent him from getting the flu.  Review of Systems  Past Medical History:  Diagnosis Date  . CAD S/P percutaneous coronary angioplasty 07/07/2011; 6 & 10 2015   S/P PCI to all 3 major vessels;a)  Ant STEMI 2001- BMS-LAD x2 -->(redo PCI 6/'15 -- pLAD Xience DES 2.5 x 12- 2.75 mm, mLAD 2.25 x 12 - 2.7 mm), b) '06 UA --> Cx- OM DES; c) 2013 Inf MI BMS mRCA --> d) 10/'15 PCI dRCA Promus P DES 4.0 x 16 (4.25 mm); PTCA of RPL2 (2.0 mm) &RPDA (2.25 mm) 11/2013  . Chronic bronchitis ([place]) "used to get it q yr"  . COPD (chronic obstructive pulmonary disease) ([place])   . Elevated WBC count   . Emphysema of lung ([place])   . Essential hypertension 07/07/2011  . Former heavy tobacco smoker     quit in May 2015 after multiple attempts at trying to quit before   . GERD (gastroesophageal reflux disease)   . Glucose intolerance (pre-diabetes) June 2015    hemoglobin A1c 6.6  . Headache    "Imdur related; stopped taking it; headaches went away" (11/15/2013)  . Heart murmur   .  History of colon polyps   . History of stomach ulcers   . Hyperlipidemia with target LDL less than 70 07/07/2011  . Metabolic syndrome    Pre-diabetes, hypertension and truncal obesity as well as dyslipidemia  . OSA on CPAP   . Pneumonia "several times"  . Recurrent upper respiratory infection (URI)   . Seizures ([place]) "several"   "last one was ~ 2011" (11/15/2013)  . Shortness of breath dyspnea   . ST elevation myocardial infarction (STEMI) of anterior wall ([place]) 2001   History of -- 2 stents in early and distal mid LAD; prior cardiologist was Dr. Remi Haggard in [place]  . ST elevation myocardial infarction (STEMI) of inferior wall ([place]) 07/07/2011   Occluded RCA [id number] INTEGRITY; Echo 07/2013: EF 45-50%, Inferior & Posterolateral HK.   Marland Kitchen Umbilical hernia   . Unstable angina ([place])  2006; June 2015   Cx-OM - PCI 2.5 mm 13 mm Cypher DES [name], [place]) ; 07/2013: Severe ISR of both prox & Distal LAD stentS --> 2 DES stents (1 at prox edge of the proximal stent, 2nd covers the entire distal stent as well as proximal and distal edge stenose)   . Upper respiratory infection     Current Outpatient  Prescriptions  Medication Sig Dispense Refill  . albuterol (PROVENTIL HFA) 108 (90 Base) MCG/ACT inhaler Inhale 1 puff into the lungs every 6 (six) hours as needed. 1 Inhaler 2  . aspirin 81 MG EC tablet Take 1 tablet (81 mg total) by mouth at bedtime. 30 tablet   . losartan (COZAAR) 25 MG tablet Take 1 tablet (25 mg total) by mouth daily. 90 tablet 3  . metoprolol succinate (TOPROL-XL) 25 MG 24 hr tablet Take 1 tablet (25 mg total) by mouth daily. Take with or immediately following a meal. 90 tablet 3  . nitroGLYCERIN (NITROSTAT) 0.4 MG SL tablet Place 1 tablet (0.4 mg total) under the tongue every 5 (five) minutes as needed for chest pain. 25 tablet 3  . omeprazole (PRILOSEC) 20 MG capsule Take 1 capsule (20 mg total) by mouth daily. 90 capsule 1  . rosuvastatin (CRESTOR) 20 MG tablet Take 1  tablet (20 mg total) by mouth daily. 30 tablet 3  . EFFIENT 10 MG TABS tablet TAKE 1 TABLET (10 MG TOTAL) BY MOUTH DAILY.  6   No current facility-administered medications for this visit.     Allergies  Allergen Reactions  . Niacin And Related Hives and Itching    Family History  Problem Relation Age of Onset  . Coronary artery disease Father   . Heart disease Father   . Stroke Father   . Hypertension Father   . Hyperlipidemia Mother   . Diabetes Mother   . Hyperlipidemia Maternal Grandmother   . Diabetes Maternal Grandmother   . Hyperlipidemia Maternal Grandfather   . Diabetes Maternal Grandfather   . Emphysema Maternal Grandfather   . Heart disease Paternal Grandmother   . Heart disease Paternal Grandfather   . Cancer Maternal Aunt     breast    Social History   Social History  . Marital status: Married    Spouse name: N/A  . Number of children: N/A  . Years of education: N/A   Occupational History  . Not on file.   Social History Main Topics  . Smoking status: Current Every Day Smoker    Packs/day: 1.00    Years: 40.00    Types: Cigarettes    Last attempt to quit: 06/19/2013  . Smokeless tobacco: Never Used     Comment: Quit with Bupropion 150mg  - > unfortunately, he restarted  . Alcohol use 0.6 oz/week    1 Cans of beer per week     Comment: rare  . Drug use: No  . Sexual activity: Yes   Other Topics Concern  . Not on file   Social History Narrative   He is a married father of 36, grandfather of 12. He does not really get routine exercise.    He is down to 5 or 6 cigarettes a day. He is very seriously wanting to quit. This is a significant cutback for him, but he has pretty much been told he needs to stop by his wife, and so he fully intends to do so. He is willing to try the patches or whatever. He has a social alcoholic beverage every now and then.      Constitutional: Denies fever, malaise, fatigue, headache or abrupt weight changes.  HEENT: Denies  eye pain, eye redness, ear pain, ringing in the ears, wax buildup, runny nose, nasal congestion, bloody nose, or sore throat. Respiratory: Denies difficulty breathing, shortness of breath, cough or sputum production.   Cardiovascular: Pt reports palpitations. Denies chest pain, chest  tightness, or swelling in the hands or feet.  Neurological: Pt reports dizziness. Denies difficulty with memory, difficulty with speech or problems with balance and coordination.  No other specific complaints in a complete review of systems (except as listed in HPI above).     Objective:   Physical Exam    BP 116/68   Pulse 94   Temp 98 F (36.7 C) (Oral)   Wt 180 lb (81.6 kg)   SpO2 96%   BMI 24.41 kg/m  Wt Readings from Last 3 Encounters:  03/16/16 180 lb (81.6 kg)  12/18/15 180 lb (81.6 kg)  12/04/15 179 lb (81.2 kg)    General: Appears his stated age, in NAD. HEENT: Head: normal shape and size; Eyes: sclera white, no icterus, conjunctiva pink, PERRLA and EOMs intact; Ears: Tm's gray and intact, normal light reflex;  Cardiovascular: Normal rate and rhythm.  Pulmonary/Chest: Normal effort and positive vesicular breath sounds. No respiratory distress. No wheezes, rales or ronchi noted.  Neurological: Alert and oriented.  Coordination normal.    BMET    Component Value Date/Time   NA 136 09/03/2015 1622   NA 142 11/07/2013 1509   NA 139 10/24/2013 1050   K 4.1 09/03/2015 1622   K 3.8 10/24/2013 1050   CL 105 09/03/2015 1622   CL 107 10/24/2013 1050   CO2 22 09/03/2015 1622   CO2 24 10/24/2013 1050   GLUCOSE 82 09/03/2015 1622   GLUCOSE 108 (H) 10/24/2013 1050   BUN 9 09/03/2015 1622   BUN 13 11/07/2013 1509   BUN 8 10/24/2013 1050   CREATININE 0.96 09/03/2015 1622   CREATININE 0.95 10/24/2013 1050   CREATININE 0.89 07/26/2013 0923   CALCIUM 9.4 09/03/2015 1622   CALCIUM 8.2 (L) 10/24/2013 1050   GFRNONAA >60 08/29/2015 0345   GFRNONAA >60 10/24/2013 1050   GFRAA >60 08/29/2015  0345   GFRAA >60 10/24/2013 1050    Lipid Panel     Component Value Date/Time   CHOL 196 09/03/2015 1622   TRIG (H) 09/03/2015 1622    402.0 Triglyceride is over 400; calculations on Lipids are invalid.   HDL 33.80 (L) 09/03/2015 1622   CHOLHDL 6 09/03/2015 1622   VLDL 58.6 (H) 03/04/2015 1500   LDLCALC 62 07/26/2013 0923    CBC    Component Value Date/Time   WBC 13.3 (H) 09/03/2015 1622   RBC 6.22 (H) 09/03/2015 1622   HGB 18.5 Repeated and verified X2. (HH) 09/03/2015 1622   HGB 15.9 10/24/2013 1050   HCT 54.6 Repeated and verified X2. (H) 09/03/2015 1622   HCT 49.4 10/24/2013 1050   PLT 363.0 09/03/2015 1622   PLT 388 10/24/2013 1050   MCV 87.8 09/03/2015 1622   MCV 89 10/24/2013 1050   MCH 30.0 08/29/2015 0345   MCHC 33.9 09/03/2015 1622   RDW 13.7 09/03/2015 1622   RDW 14.0 11/07/2013 1509   RDW 14.5 10/24/2013 1050   LYMPHSABS 4.6 (H) 04/04/2015 1040   LYMPHSABS 2.8 11/07/2013 1509   MONOABS 0.9 04/04/2015 1040   EOSABS 0.1 04/04/2015 1040   EOSABS 0.2 11/07/2013 1509   BASOSABS 0.1 04/04/2015 1040   BASOSABS 0.1 11/07/2013 1509    Hgb A1C Lab Results  Component Value Date   HGBA1C 5.6 09/03/2015          Assessment & Plan:   Vertigo:  Seems to have subsided Advised him to make position changes slowly and drink plenty of water eRx for Meclizine 25 mg TID  prn to take if symptoms reoccur  Flu prophylaxis:  Palpitations:  Normal today Advised him to follow up with cardiology If reoccurs while out of town, may need ER eval  eRx for Tamiflu 75 mg daily x 10 days  RTC as needed or if symptoms persist or worsen Stephenia Vogan, NP

## 2016-03-30 ENCOUNTER — Ambulatory Visit (INDEPENDENT_AMBULATORY_CARE_PROVIDER_SITE_OTHER): Payer: PPO | Admitting: Internal Medicine

## 2016-03-30 ENCOUNTER — Encounter: Payer: Self-pay | Admitting: Internal Medicine

## 2016-03-30 VITALS — BP 118/70 | HR 96 | Temp 97.9°F | Ht 70.75 in | Wt 180.8 lb

## 2016-03-30 DIAGNOSIS — J449 Chronic obstructive pulmonary disease, unspecified: Secondary | ICD-10-CM

## 2016-03-30 DIAGNOSIS — G4733 Obstructive sleep apnea (adult) (pediatric): Secondary | ICD-10-CM | POA: Diagnosis not present

## 2016-03-30 DIAGNOSIS — Z0000 Encounter for general adult medical examination without abnormal findings: Secondary | ICD-10-CM

## 2016-03-30 DIAGNOSIS — E119 Type 2 diabetes mellitus without complications: Secondary | ICD-10-CM

## 2016-03-30 DIAGNOSIS — E785 Hyperlipidemia, unspecified: Secondary | ICD-10-CM

## 2016-03-30 DIAGNOSIS — Z9861 Coronary angioplasty status: Secondary | ICD-10-CM | POA: Diagnosis not present

## 2016-03-30 DIAGNOSIS — I2510 Atherosclerotic heart disease of native coronary artery without angina pectoris: Secondary | ICD-10-CM

## 2016-03-30 DIAGNOSIS — Z125 Encounter for screening for malignant neoplasm of prostate: Secondary | ICD-10-CM | POA: Diagnosis not present

## 2016-03-30 DIAGNOSIS — I5042 Chronic combined systolic (congestive) and diastolic (congestive) heart failure: Secondary | ICD-10-CM

## 2016-03-30 DIAGNOSIS — K2100 Gastro-esophageal reflux disease with esophagitis, without bleeding: Secondary | ICD-10-CM

## 2016-03-30 DIAGNOSIS — Z1159 Encounter for screening for other viral diseases: Secondary | ICD-10-CM

## 2016-03-30 DIAGNOSIS — I10 Essential (primary) hypertension: Secondary | ICD-10-CM

## 2016-03-30 DIAGNOSIS — Z114 Encounter for screening for human immunodeficiency virus [HIV]: Secondary | ICD-10-CM | POA: Diagnosis not present

## 2016-03-30 LAB — COMPREHENSIVE METABOLIC PANEL
ALT: 26 U/L (ref 0–53)
AST: 20 U/L (ref 0–37)
Albumin: 4.2 g/dL (ref 3.5–5.2)
Alkaline Phosphatase: 125 U/L — ABNORMAL HIGH (ref 39–117)
BUN: 11 mg/dL (ref 6–23)
CO2: 25 mEq/L (ref 19–32)
Calcium: 9.3 mg/dL (ref 8.4–10.5)
Chloride: 109 mEq/L (ref 96–112)
Creatinine, Ser: 0.77 mg/dL (ref 0.40–1.50)
GFR: 111.13 mL/min (ref >60.00)
Glucose, Bld: 91 mg/dL (ref 70–99)
Potassium: 4.3 mEq/L (ref 3.5–5.1)
Sodium: 138 mEq/L (ref 135–145)
Total Bilirubin: 0.5 mg/dL (ref 0.2–1.2)
Total Protein: 6.8 g/dL (ref 6.0–8.3)

## 2016-03-30 LAB — HEMOGLOBIN A1C: Hgb A1c MFr Bld: 6 % (ref 4.6–6.5)

## 2016-03-30 LAB — LIPID PANEL
Cholesterol: 188 mg/dL (ref 0–200)
HDL: 38.4 mg/dL — ABNORMAL LOW (ref >39.00)
LDL Cholesterol: 114 mg/dL — ABNORMAL HIGH (ref 0–99)
NonHDL: 149.36
Total CHOL/HDL Ratio: 5
Triglycerides: 177 mg/dL — ABNORMAL HIGH (ref 0.0–149.0)
VLDL: 35.4 mg/dL (ref 0.0–40.0)

## 2016-03-30 LAB — PSA, MEDICARE: PSA: 0.41 ng/ml (ref 0.10–4.00)

## 2016-03-30 NOTE — Progress Notes (Signed)
HPI:  Pt presents to the clinic today for his Welcome to Medicare exam. He is also due for follow up of chronic conditions.  HTN: His BP today is 118/70. He is taking Losartan and Metoprolol as prescribed. ECG from 09/2015 reviewed.  HLD/CAD with angina s/p stents: His last LDL was 109. He is taking Crestor and Effient as prescribed. He denies myalgias. He does not consume a low fat diet. He follows with Dr.Harding.   CHF: He denies swelling or increased shortness of breath. He is taking Losartan and Metoprolol as prescribed. He is not on any diuretics. Echo from 03/2015 reviewed.  OSA: He does not wear his CPAP. He averages 7-8  hours of sleep at night. He has not felt fatigued during the day recently.  COPD: He has started smoking again, 5-6 cigarettes a day. He uses an Albuterol inhaler a few times a month. He follows with Dr. Halford Chessman and plans to make an appt with him soon.  DM 2: His last A1C was 5.6%.Marland Kitchen He does not check his sugars. He is currently not taking any medication for diabetes at this time. He does not check his feet. His last eye exam was 01/2016.  GERD: Triggered by greasy foods. His symptoms are controlled on Prilosec.  Past Medical History:  Diagnosis Date  . CAD S/P percutaneous coronary angioplasty 07/07/2011; 6 & 10 2015   S/P PCI to all 3 major vessels;a)  Ant STEMI 2001- BMS-LAD x2 -->(redo PCI 6/'15 -- pLAD Xience DES 2.5 x 12- 2.75 mm, mLAD 2.25 x 12 - 2.7 mm), b) '06 UA --> Cx- OM DES; c) 2013 Inf MI BMS mRCA --> d) 10/'15 PCI dRCA Promus P DES 4.0 x 16 (4.25 mm); PTCA of RPL2 (2.0 mm) &RPDA (2.25 mm) 11/2013  . Chronic bronchitis ([place]) "used to get it q yr"  . COPD (chronic obstructive pulmonary disease) ([place])   . Elevated WBC count   . Emphysema of lung ([place])   . Essential hypertension 07/07/2011  . Former heavy tobacco smoker     quit in May 2015 after multiple attempts at trying to quit before   . GERD (gastroesophageal reflux disease)   . Glucose intolerance  (pre-diabetes) June 2015    hemoglobin A1c 6.6  . Headache    "Imdur related; stopped taking it; headaches went away" (11/15/2013)  . Heart murmur   . History of colon polyps   . History of stomach ulcers   . Hyperlipidemia with target LDL less than 70 07/07/2011  . Metabolic syndrome    Pre-diabetes, hypertension and truncal obesity as well as dyslipidemia  . OSA on CPAP   . Pneumonia "several times"  . Recurrent upper respiratory infection (URI)   . Seizures ([place]) "several"   "last one was ~ 2011" (11/15/2013)  . Shortness of breath dyspnea   . ST elevation myocardial infarction (STEMI) of anterior wall ([place]) 2001   History of -- 2 stents in early and distal mid LAD; prior cardiologist was Dr. Remi Haggard in [place]  . ST elevation myocardial infarction (STEMI) of inferior wall ([place]) 07/07/2011   Occluded RCA [id number] [facility]; Echo 07/2013: EF 45-50%, Inferior & Posterolateral HK.   Marland Kitchen Umbilical hernia   . Unstable angina ([place])  2006; June 2015   Cx-OM - PCI 2.5 mm 13 mm Cypher DES [name], [place]) ; 07/2013: Severe ISR of both prox & Distal LAD stentS --> 2 DES stents (1 at prox edge of the proximal stent, 2nd covers the entire  distal stent as well as proximal and distal edge stenose)   . Upper respiratory infection     Current Outpatient Prescriptions  Medication Sig Dispense Refill  . albuterol (PROVENTIL HFA) 108 (90 Base) MCG/ACT inhaler Inhale 1 puff into the lungs every 6 (six) hours as needed. 1 Inhaler 2  . aspirin 81 MG EC tablet Take 1 tablet (81 mg total) by mouth at bedtime. 30 tablet   . EFFIENT 10 MG TABS tablet TAKE 1 TABLET (10 MG TOTAL) BY MOUTH DAILY.  6  . losartan (COZAAR) 25 MG tablet Take 1 tablet (25 mg total) by mouth daily. 90 tablet 3  . meclizine (ANTIVERT) 25 MG tablet Take 1 tablet (25 mg total) by mouth 3 (three) times daily as needed for dizziness. 30 tablet 0  . metoprolol succinate (TOPROL-XL) 25 MG 24 hr tablet Take 1 tablet (25 mg total) by mouth  daily. Take with or immediately following a meal. 90 tablet 3  . nitroGLYCERIN (NITROSTAT) 0.4 MG SL tablet Place 1 tablet (0.4 mg total) under the tongue every 5 (five) minutes as needed for chest pain. 25 tablet 3  . omeprazole (PRILOSEC) 20 MG capsule Take 1 capsule (20 mg total) by mouth daily. 90 capsule 1  . oseltamivir (TAMIFLU) 75 MG capsule Take 1 capsule (75 mg total) by mouth daily. 10 capsule 0  . rosuvastatin (CRESTOR) 20 MG tablet Take 1 tablet (20 mg total) by mouth daily. 30 tablet 3   No current facility-administered medications for this visit.     Allergies  Allergen Reactions  . Niacin And Related Hives and Itching    Family History  Problem Relation Age of Onset  . Coronary artery disease Father   . Heart disease Father   . Stroke Father   . Hypertension Father   . Hyperlipidemia Mother   . Diabetes Mother   . Hyperlipidemia Maternal Grandmother   . Diabetes Maternal Grandmother   . Hyperlipidemia Maternal Grandfather   . Diabetes Maternal Grandfather   . Emphysema Maternal Grandfather   . Heart disease Paternal Grandmother   . Heart disease Paternal Grandfather   . Cancer Maternal Aunt     breast    Social History   Social History  . Marital status: Married    Spouse name: N/A  . Number of children: N/A  . Years of education: N/A   Occupational History  . Not on file.   Social History Main Topics  . Smoking status: Current Every Day Smoker    Packs/day: 1.00    Years: 40.00    Types: Cigarettes    Last attempt to quit: 06/19/2013  . Smokeless tobacco: Never Used     Comment: Quit with Bupropion 150mg  - > unfortunately, he restarted  . Alcohol use 0.6 oz/week    1 Cans of beer per week     Comment: rare  . Drug use: No  . Sexual activity: Yes   Other Topics Concern  . Not on file   Social History Narrative   He is a married father of 16, grandfather of 36. He does not really get routine exercise.    He is down to 5 or 6 cigarettes a day.  He is very seriously wanting to quit. This is a significant cutback for him, but he has pretty much been told he needs to stop by his wife, and so he fully intends to do so. He is willing to try the patches or whatever. He has a  social alcoholic beverage every now and then.     Hospitiliaztions: None  Health Maintenance:    Flu: 11/2015  Tetanus: 02/2015  Pneumovax: 07/2011  Prevnar: 11/2014  PSA: 02/2015  Colon Screening: 2017  Eye Doctor: 01/2016  Dental Exam: as needed   Providers:   PCP: Webb Silversmith, NP-C  Cardiologist: Dr. Ellyn Hack  Pulmonologist: Dr. Halford Chessman   I have personally reviewed and have noted:  1. The patient's medical and social history 2. Their use of alcohol, tobacco or illicit drugs 3. Their current medications and supplements 4. The patient's functional ability including ADL's, fall risks, home safety risks and hearing or visual impairment. 5. Diet and physical activities 6. Evidence for depression or mood disorder  Subjective:   Review of Systems:   Constitutional: Denies fever, malaise, fatigue, headache or abrupt weight changes.  HEENT: Denies eye pain, eye redness, ear pain, ringing in the ears, wax buildup, runny nose, nasal congestion, bloody nose, or sore throat. Respiratory: Pt reports intermittent shortness of breath. Denies difficulty breathing, cough or sputum production.   Cardiovascular: Pt reports intermittent palpitations. Denies chest pain, chest tightness, or swelling in the hands or feet.  Gastrointestinal: Denies abdominal pain, bloating, constipation, diarrhea or blood in the stool.  GU: Denies urgency, frequency, pain with urination, burning sensation, blood in urine, odor or discharge. Musculoskeletal: Denies decrease in range of motion, difficulty with gait, muscle pain or joint pain and swelling.  Skin: Denies redness, rashes, lesions or ulcercations.  Neurological: Denies dizziness, difficulty with memory, difficulty with speech or  problems with balance and coordination.  Psych: Denies anxiety, depression, SI/HI.  No other specific complaints in a complete review of systems (except as listed in HPI above).  Objective:  PE:   BP 118/70   Pulse 96   Temp 97.9 F (36.6 C) (Oral)   Ht 5' 10.75" (1.797 m)   Wt 180 lb 12 oz (82 kg)   SpO2 96%   BMI 25.39 kg/m   Wt Readings from Last 3 Encounters:  03/16/16 180 lb (81.6 kg)  12/18/15 180 lb (81.6 kg)  12/04/15 179 lb (81.2 kg)    General: Appears his stated age, well developed, well nourished in NAD. Skin: Warm, dry and intact.  HEENT: Head: normal shape and size; Eyes: sclera white, no icterus, conjunctiva pink, PERRLA and EOMs intact;  Cardiovascular: Normal rate and rhythm. S1,S2 noted.  No murmur, rubs or gallops noted. No carotid bruits noted. Pulmonary/Chest: Normal effort and positive vesicular breath sounds. No respiratory distress. No wheezes, rales or ronchi noted.  Abdomen: Soft and nontender. Normal bowel sounds. Ventral hernia noted. Neurological: Alert and oriented.  Psychiatric: Mood and affect normal. Behavior is normal. Judgment and thought content normal.   BMET    Component Value Date/Time   NA 136 09/03/2015 1622   NA 142 11/07/2013 1509   NA 139 10/24/2013 1050   K 4.1 09/03/2015 1622   K 3.8 10/24/2013 1050   CL 105 09/03/2015 1622   CL 107 10/24/2013 1050   CO2 22 09/03/2015 1622   CO2 24 10/24/2013 1050   GLUCOSE 82 09/03/2015 1622   GLUCOSE 108 (H) 10/24/2013 1050   BUN 9 09/03/2015 1622   BUN 13 11/07/2013 1509   BUN 8 10/24/2013 1050   CREATININE 0.96 09/03/2015 1622   CREATININE 0.95 10/24/2013 1050   CREATININE 0.89 07/26/2013 0923   CALCIUM 9.4 09/03/2015 1622   CALCIUM 8.2 (L) 10/24/2013 1050   GFRNONAA >60 08/29/2015 0345  GFRNONAA >60 10/24/2013 1050   GFRAA >60 08/29/2015 0345   GFRAA >60 10/24/2013 1050    Lipid Panel     Component Value Date/Time   CHOL 196 09/03/2015 1622   TRIG (H) 09/03/2015 1622     402.0 Triglyceride is over 400; calculations on Lipids are invalid.   HDL 33.80 (L) 09/03/2015 1622   CHOLHDL 6 09/03/2015 1622   VLDL 58.6 (H) 03/04/2015 1500   LDLCALC 62 07/26/2013 0923    CBC    Component Value Date/Time   WBC 13.3 (H) 09/03/2015 1622   RBC 6.22 (H) 09/03/2015 1622   HGB 18.5 Repeated and verified X2. (HH) 09/03/2015 1622   HGB 15.9 10/24/2013 1050   HCT 54.6 Repeated and verified X2. (H) 09/03/2015 1622   HCT 49.4 10/24/2013 1050   PLT 363.0 09/03/2015 1622   PLT 388 10/24/2013 1050   MCV 87.8 09/03/2015 1622   MCV 89 10/24/2013 1050   MCH 30.0 08/29/2015 0345   MCHC 33.9 09/03/2015 1622   RDW 13.7 09/03/2015 1622   RDW 14.0 11/07/2013 1509   RDW 14.5 10/24/2013 1050   LYMPHSABS 4.6 (H) 04/04/2015 1040   LYMPHSABS 2.8 11/07/2013 1509   MONOABS 0.9 04/04/2015 1040   EOSABS 0.1 04/04/2015 1040   EOSABS 0.2 11/07/2013 1509   BASOSABS 0.1 04/04/2015 1040   BASOSABS 0.1 11/07/2013 1509    Hgb A1C Lab Results  Component Value Date   HGBA1C 5.6 09/03/2015      Assessment and Plan:   Medicare Annual Wellness Visit:  Diet: He does eat meat. He consumes fruits and veggies daily. He tries to avoid fried foods. He drinks mostly water. Physical activity: Sedentary Depression/mood screen: Negative Hearing: Intact to whispered voice Visual acuity: Grossly normal ADLs: Capable Fall risk: None Home safety: Good Cognitive evaluation: Intact to orientation, naming, recall and repetition EOL planning: Adv directives, full code/ I agree  Preventative Medicine: All immunizations are UTD. PSA today with labs. Colon Screening is UTD. Encouraged him to consume a balanced diet and start a light exercising program. Advised him to see an eye doctor and dentist annually. Will check CBC, CMET, Lipid, A1C, HIV and Hep C.   Next appointment: 1 year Medicare Wellness    Webb Silversmith, NP

## 2016-03-30 NOTE — Patient Instructions (Signed)

## 2016-03-31 LAB — HIV ANTIBODY (ROUTINE TESTING W REFLEX): HIV 1&2 Ab, 4th Generation: NONREACTIVE

## 2016-03-31 LAB — HEPATITIS C ANTIBODY: HCV Ab: NEGATIVE

## 2016-03-31 NOTE — Assessment & Plan Note (Signed)
A1C today Encouraged him to consume a low carb diet No microalbumin secondary to ARB therapy Foot exam today Eye exam UTD Immunizations UTD

## 2016-03-31 NOTE — Assessment & Plan Note (Signed)
Compensated Continue Losartan and Metoprolol He does not need diuretics at this time Will monitor

## 2016-03-31 NOTE — Assessment & Plan Note (Signed)
CMET and Lipid profile today Continue Crestor and Effient Advised him to consume a low fat diet

## 2016-03-31 NOTE — Assessment & Plan Note (Signed)
Encouraged her to consume a low fat diet Continue Crestor and Effient

## 2016-03-31 NOTE — Assessment & Plan Note (Signed)
He does not wear his CPAP He denies any issues at this time

## 2016-03-31 NOTE — Assessment & Plan Note (Signed)
He feels like he may need another maintenance inhaler Did not tolerate Advair He will make an appt with Dr. Halford Chessman to discuss this Continue Albuterol prn

## 2016-03-31 NOTE — Assessment & Plan Note (Signed)
Controlled on Losartan and Metoprolol °Will monitor °

## 2016-03-31 NOTE — Assessment & Plan Note (Signed)
Controlled on Prilosec Will monitor 

## 2016-04-16 ENCOUNTER — Ambulatory Visit (INDEPENDENT_AMBULATORY_CARE_PROVIDER_SITE_OTHER): Payer: PPO | Admitting: Cardiology

## 2016-04-16 ENCOUNTER — Encounter: Payer: Self-pay | Admitting: Cardiology

## 2016-04-16 VITALS — BP 127/89 | HR 103 | Ht 72.0 in | Wt 183.8 lb

## 2016-04-16 DIAGNOSIS — I255 Ischemic cardiomyopathy: Secondary | ICD-10-CM

## 2016-04-16 DIAGNOSIS — Z91199 Patient's noncompliance with other medical treatment and regimen due to unspecified reason: Secondary | ICD-10-CM

## 2016-04-16 DIAGNOSIS — I2109 ST elevation (STEMI) myocardial infarction involving other coronary artery of anterior wall: Secondary | ICD-10-CM

## 2016-04-16 DIAGNOSIS — R079 Chest pain, unspecified: Secondary | ICD-10-CM | POA: Diagnosis not present

## 2016-04-16 DIAGNOSIS — E785 Hyperlipidemia, unspecified: Secondary | ICD-10-CM | POA: Diagnosis not present

## 2016-04-16 DIAGNOSIS — I2510 Atherosclerotic heart disease of native coronary artery without angina pectoris: Secondary | ICD-10-CM | POA: Diagnosis not present

## 2016-04-16 DIAGNOSIS — I5189 Other ill-defined heart diseases: Secondary | ICD-10-CM | POA: Diagnosis not present

## 2016-04-16 DIAGNOSIS — I208 Other forms of angina pectoris: Secondary | ICD-10-CM

## 2016-04-16 DIAGNOSIS — Z9119 Patient's noncompliance with other medical treatment and regimen: Secondary | ICD-10-CM

## 2016-04-16 DIAGNOSIS — Z9861 Coronary angioplasty status: Secondary | ICD-10-CM | POA: Diagnosis not present

## 2016-04-16 DIAGNOSIS — R5383 Other fatigue: Secondary | ICD-10-CM

## 2016-04-16 DIAGNOSIS — I2119 ST elevation (STEMI) myocardial infarction involving other coronary artery of inferior wall: Secondary | ICD-10-CM

## 2016-04-16 DIAGNOSIS — I5042 Chronic combined systolic (congestive) and diastolic (congestive) heart failure: Secondary | ICD-10-CM

## 2016-04-16 NOTE — Progress Notes (Signed)
PCP: Webb Silversmith, NP  Clinic Note: Chief Complaint  Patient presents with  . Shortness of Breath  . Coronary Artery Disease    cardiomyopathy    HPI: Troy Parrish is a 56 y.o. male with a PMH below who presents today for 4 month f/u for significant MV CAD - s/p at least 3 STEMIs.  Troy Parrish was last seen on Nov 14, 2015:  At that time he was doing relatively well. He was happy, was doing activities with his family including traveling. He did note exertional dyspnea but only if he over did it. Relatively well-controlled anginal symptoms. One issue with him is been up titration of his beta blocker dose.  Recent Hospitalizations:  none  Studies Reviewed:  none  Interval History: Troy Parrish presents today a little bitless excited and he was last saw him. He indicates that he is been not able to do the same amount of intensity of exercis more short of breath withhis routine exertion. He has not had any chest  Tightness associatedwith it with exertion or twice. Take nitroglycerin. He tells me that he also has some dizziness when he is walking with an irregular heartbeats, but nothing sustained. He thinks it may be more related to balance the mid thing else because it is happening at rest and with walking. He is getting over a cold and thinks that may be part of the balance issues. He wants to join a gym with his family and do routine exercise, but is scared with is now having more exertional dyspnea and irregular heartbeats. He doesn't really think these having any arrhythmias  No real PND, orthopnea or edema. No resting dyspnea or chest pain.   No syncope/near syncope. No TIA/amaurosis fugax symptoms. No claudication.   He still indicates that hehas been unable to fully quit smoking. Every time quits goes right back to smoking them. Not much, still is smoking occasionally.  ROS: A comprehensive was performed. Review of Systems  Constitutional: Positive for malaise/fatigue (Less energy than  usual).  HENT: Negative for nosebleeds.   Respiratory: Positive for shortness of breath. Negative for cough and wheezing.   Gastrointestinal: Negative for blood in stool and melena.  Genitourinary: Negative for hematuria.  Musculoskeletal: Negative for falls.  Skin: Negative.   Neurological: Positive for dizziness (He is noting vertigo symptoms). Negative for focal weakness.  Psychiatric/Behavioral: Negative for depression (Seems like more dysthymia) and memory loss. The patient has insomnia (Not really sleeping well.). The patient is not nervous/anxious.   All other systems reviewed and are negative.   Past Medical History:  Diagnosis Date  . CAD S/P percutaneous coronary angioplasty 07/07/2011; 6 & 10 2015   S/P PCI to all 3 major vessels;a)  Ant STEMI 2001- BMS-LAD x2 -->(redo PCI 6/'15 -- pLAD Xience DES 2.5 x 12- 2.75 mm, mLAD 2.25 x 12 - 2.7 mm), b) '06 UA --> Cx- OM DES; c) 2013 Inf MI BMS mRCA --> d) 10/'15 PCI dRCA Promus P DES 4.0 x 16 (4.25 mm); PTCA of RPL2 (2.0 mm) &RPDA (2.25 mm) 11/2013  . Chronic bronchitis ([place]) "used to get it q yr"  . COPD (chronic obstructive pulmonary disease) ([place])   . Elevated WBC count   . Emphysema of lung ([place])   . Essential hypertension 07/07/2011  . Former heavy tobacco smoker     quit in May 2015 after multiple attempts at trying to quit before   . GERD (gastroesophageal reflux disease)   . Glucose intolerance (pre-diabetes)  June 2015    hemoglobin A1c 6.6  . Headache    "Imdur related; stopped taking it; headaches went away" (11/15/2013)  . Heart murmur   . History of colon polyps   . History of stomach ulcers   . Hyperlipidemia with target LDL less than 70 07/07/2011  . Metabolic syndrome    Pre-diabetes, hypertension and truncal obesity as well as dyslipidemia  . OSA on CPAP   . Pneumonia "several times"  . Recurrent upper respiratory infection (URI)   . Seizures ([place]) "several"   "last one was ~ 2011" (11/15/2013)  . Shortness of  breath dyspnea   . ST elevation myocardial infarction (STEMI) of anterior wall ([place]) 2001   History of -- 2 stents in early and distal mid LAD; prior cardiologist was Dr. Remi Haggard in [place]  . ST elevation myocardial infarction (STEMI) of inferior wall ([place]) 07/07/2011   Occluded RCA 2.83T51 INTEGRITY; Echo 07/2013: EF 45-50%, Inferior & Posterolateral HK.   Marland Kitchen Umbilical hernia   . Unstable angina ([place])  2006; June 2015   Cx-OM - PCI 2.5 mm 13 mm Cypher DES [name], [place]) ; 07/2013: Severe ISR of both prox & Distal LAD stentS --> 2 DES stents (1 at prox edge of the proximal stent, 2nd covers the entire distal stent as well as proximal and distal edge stenose)   . Upper respiratory infection     Past Surgical History:  Procedure Laterality Date  . Cardiac MRI [facility]  09/2014   EF 51%. Mod HK of basal-mid Inf wall, mild HK of basal inferoseptum & basal-mid inferolateral wall with hyper enhancement (c/w subendocard RCA MI with significant viability), focal hyper enhancement of apical wall c/w dLAD subendocard MI & complete LAD viability. . No aortic stenosis. Normal RV function. No Ischemia on Adenosine Stress.  . COLONOSCOPY WITH PROPOFOL N/A 03/28/2015   Procedure: COLONOSCOPY WITH PROPOFOL;  Surgeon: Lucilla Lame, MD;  Location: [place];  Service: Endoscopy;  Laterality: N/A;  . CORONARY ANGIOPLASTY WITH STENT PLACEMENT  2001   ANTERIOR mi with a  PCI to LAD; [place], [place] - Dr. Dorris Fetch  . CORONARY ANGIOPLASTY WITH STENT PLACEMENT  2006   [place] by Dr Dorris Fetch -lesion lft circ/OM1 with 2.5 x 37m Cypher DES  . ESOPHAGOGASTRODUODENOSCOPY (EGD) WITH PROPOFOL N/A 03/28/2015   Procedure: ESOPHAGOGASTRODUODENOSCOPY (EGD) WITH PROPOFOL;  Surgeon: DLucilla Lame MD;  [place]  Service: Endoscopy;  Laterality: N/A;  . INSERTION OF MESH N/A 04/02/2015   Procedure: INSERTION OF MESH;  Surgeon: RFlorene Glen MD;  Location: [facility];  Service: General;  Laterality:  N/A;  . LEFT AND RIGHT HEART CATHETERIZATION WITH CORONARY ANGIOGRAM N/A 07/30/2013   Procedure: LEFT AND RIGHT HEART CATHETERIZATION WITH CORONARY ANGIOGRAM;  Surgeon: DLeonie Man MD;  [facility];  Service: Cardiovascular;  2 separate P & mLAD lesions -> PCI, MOd-Severe dRCA disesase with diffuse RPL/PDA disease (FFR)  . LEFT HEART CATH  08/03/2013   Procedure: LEFT HEART CATH;  Surgeon: DLeonie Man MD;  [facility];  Service: Cardiovascular;;FFR of RCA - non-flow-limiting  . LEFT HEART CATHETERIZATION WITH CORONARY ANGIOGRAM N/A 07/07/2011   Procedure: LEFT HEART CATHETERIZATION WITH CORONARY ANGIOGRAM;  Surgeon: DLeonie Man MD;  [facility];  Service: Cardiovascular;  inferolat post LV infarct ST-elevation MI  . MET/CPET  11/09/2011   submax. effort 1.05 RER peak V02 was 51% ,chronotropic incomp.hrt rate lows 80s to 100  . MET/CPET  September 2016   [facility]: Mild functional impairment due primarily to mild pulmonary and circulatory limitations. Also suggest physical deconditioning (seen with low-normal aerobic reserve). Mild VQ mismatch with exercise.  Ventilatory reserve exhausted with peak exercise demonstrated pulmonary limitation. No significant decrease in postexercise FEV1 compared to rest --> suggest no exercise induced bronchospasm  . NM MYOVIEW ([place] HX)  11/30/2011   EF 45% ,exercise 10 METS, infarct\scar w mild perinfarct ischemia -basal inferolat and mid inferolat region  . PERCUTANEOUS CORONARY STENT INTERVENTION (PCI-S)  07/07/2011   Procedure: PERCUTANEOUS CORONARY STENT INTERVENTION (PCI-S);  Surgeon: Leonie Man, MD;  [facility] C[facility] LAB;  Service: Cardiovascular;;occluded RCA ,Integrity Resolute DES 2.75 x 18 mm stent - post-dilated 3.1 mm  . PERCUTANEOUS CORONARY STENT INTERVENTION (PCI-S)  07/30/2013   Procedure: PERCUTANEOUS CORONARY STENT INTERVENTION (PCI-S);  Surgeon: Leonie Man, MD;  [facility];  Service:  Cardiovascular;;Distal mid LAD: Xience Alpine DES 2.25 mm x 28 mm (overlapping proximal and distal edge of previous stent) - 2.7 mm; proximal LAD 2.5 mm x 12 mm Xience Alpine DES (2.8 mm);; RCA 60-70% stenosis planned staged procedure  . PERCUTANEOUS CORONARY STENT INTERVENTION (PCI-S) N/A 11/15/2013   Procedure: PERCUTANEOUS CORONARY STENT INTERVENTION (PCI-S);  Surgeon: Leonie Man, MD;  Location: [facility] CATH LAB;  Service: Cardiovascular;  Patent Cx & LAD Stents; PCI -dRCA Promus Premier DES 4.0 mm x 16 mm (4.25 mm) dRCA, 2.0 mm Cutting PTCA of RPL2 [place] & POBA of mRPDA  . POLYPECTOMY  03/28/2015   Procedure: POLYPECTOMY;  Surgeon: Lucilla Lame, MD;  Location: [place];  Service: Endoscopy;;  . RIGHT HEART CATH  June 2015   Normal pressures with severely reduced cardiac output and index. (3.25/1.55)  . TRANSTHORACIC ECHOCARDIOGRAM  07/31/2013; February 2017   a. LVEF 45-50. Inferior posterior hypokinesis with mild dilation. Apparent normal diastolic pressures;   . UMBILICAL HERNIA REPAIR N/A 04/02/2015   Procedure: HERNIA REPAIR UMBILICAL ADULT;  Surgeon: Florene Glen, MD;  Location: [facility];  Service: General;  Laterality: N/A;    Current Meds  Medication Sig  . albuterol (PROVENTIL HFA) 108 (90 Base) MCG/ACT inhaler Inhale 1 puff into the lungs every 6 (six) hours as needed.  Marland Kitchen aspirin 81 MG EC tablet Take 1 tablet (81 mg total) by mouth at bedtime.  Marland Kitchen EFFIENT 10 MG TABS tablet TAKE 1 TABLET (10 MG TOTAL) BY MOUTH DAILY.  Marland Kitchen losartan (COZAAR) 25 MG tablet Take 1 tablet (25 mg total) by mouth daily.  . meclizine (ANTIVERT) 25 MG tablet Take 1 tablet (25 mg total) by mouth 3 (three) times daily as needed for dizziness.  . metoprolol succinate (TOPROL-XL) 25 MG 24 hr tablet Take 1 tablet (25 mg total) by mouth daily. Take with or immediately following a meal.  . nitroGLYCERIN (NITROSTAT) 0.4 MG SL tablet Place 1 tablet (0.4 mg total) under the tongue every 5 (five) minutes as  needed for chest pain.  Marland Kitchen omeprazole (PRILOSEC) 20 MG capsule Take 1 capsule (20 mg total) by mouth daily.  . rosuvastatin (CRESTOR) 20 MG tablet Take 1 tablet (20 mg total) by mouth daily.    Allergies  Allergen Reactions  . Niacin And Related Hives and Itching    Social History   Social History  . Marital status: Married    Spouse name: N/A  . Number of children: N/A  . Years of education: N/A   Social History Main Topics  . Smoking status: Current Every Day  Smoker    Packs/day: 0.50    Years: 40.00    Types: Cigarettes    Last attempt to quit: 06/19/2013  . Smokeless tobacco: Never Used     Comment: Quit with Bupropion '150mg'$  - > unfortunately, he restarted  . Alcohol use 0.6 oz/week    1 Cans of beer per week     Comment: rare  . Drug use: No  . Sexual activity: Yes   Other Topics Concern  . None   Social History Narrative   He is a married father of 110, grandfather of 23. He does not really get routine exercise.    He is down to 5 or 6 cigarettes a day. He is very seriously wanting to quit. This is a significant cutback for him, but he has pretty much been told he needs to stop by his wife, and so he fully intends to do so. He is willing to try the patches or whatever. He has a social alcoholic beverage every now and then.     family history includes Cancer in his maternal aunt; Coronary artery disease in his father; Diabetes in his maternal grandfather, maternal grandmother, and mother; Emphysema in his maternal grandfather; Heart disease in his father, paternal grandfather, and paternal grandmother; Hyperlipidemia in his maternal grandfather, maternal grandmother, and mother; Hypertension in his father; Stroke in his father.  Wt Readings from Last 3 Encounters:  04/16/16 83.4 kg (183 lb 12.8 oz)  03/30/16 82 kg (180 lb 12 oz)  03/16/16 81.6 kg (180 lb)    PHYSICAL EXAM BP 127/89 (BP Location: Right Arm)   Pulse (!) 103   Ht 6' (1.829 m)   Wt 83.4 kg (183 lb 12.8  oz)   BMI 24.93 kg/m  General appearance: alert, cooperative, appears stated age, he no longer seems depressed. He actually has more color than usual today and in somewhat better spirits. Well-nourished and well-groomed  HEENT: [place]/AT, EOMI, MMM, anicteric sclera Neck: no adenopathy, no carotid bruit, no JVD and supple, symmetrical, trachea midline  Lungs: diminished breath sounds bilaterally, wheezes bilaterally and Otherwise the most part CPAP. Nonlabored. Increased AP diameter. Prolonged expiratory phase  Heart: RRR with ectopy, S1, S2 normal, no murmur, click, rub or gallop and normal apical impulse  Abdomen: soft, non-tender; bowel sounds normal; no masses, no organomegaly and Mildly protuberant  Extremities: extremities normal, atraumatic, no cyanosis or edema  Pulses: 2+ and symmetric  Neurologic: Mental status: Alert, oriented, thought content appropriate, affect: blunted and mood-congruent    Adult ECG Report n/a   Other studies Reviewed: Additional studies/ records that were reviewed today include:  Recent Labs:   Lab Results  Component Value Date   CHOL 188 03/30/2016   HDL 38.40 (L) 03/30/2016   LDLCALC 114 (H) 03/30/2016   LDLDIRECT 109.0 09/03/2015   TRIG 177.0 (H) 03/30/2016   CHOLHDL 5 03/30/2016   Lab Results  Component Value Date   CREATININE 0.77 03/30/2016   BUN 11 03/30/2016   NA 138 03/30/2016   K 4.3 03/30/2016   CL 109 03/30/2016   CO2 25 03/30/2016    ASSESSMENT / PLAN: Problem List Items Addressed This Visit    CAD S/P percutaneous coronary angioplasty (Chronic)     Reevaluating for ischemia with Myoview stress test. For now continue current dose of ARB, calcium channel blocker as well as aspirin/Effient and Crestor.      Cardiomyopathy, ischemic; EF~45% with hypokinesis of the inferior and inferolateral myocardium; CO 3.25 (Chronic)  Relevant Orders   Myocardial Perfusion Imaging   Chest pain with high risk for cardiac etiology -  Primary     With his history of known CAD, having had to take nitroglycerin twice for chest pain and having exertional dyspnea now, I am concerned he may have some progression of disease. We are not able to keep his lipids as well controlled as desired, and he still is smoking. His last stress test showedsignificant inferior infarct, which we would expect to see again. However I do think with his desire to join a gym we need to exclude any worsening ischemic disease.   Plan: Relook Myoview      Relevant Orders   Myocardial Perfusion Imaging   Chronic combined systolic and diastolic congestive heart failure, NYHA class 2 (HCC) (Chronic)     Seems to be relatively well compensated. Not requiring diuretic as he is euvolemic. He is on a good dose of ARB and Toprol.      Chronotropic incompetence with left ventricular dysfunction --> improved with reduction of beta blocker (Chronic)     Interestingly, he now has a heart rate of 103 bpm on low-dose Toprol.With his fatigue, I am reluctant to titrated up, but we may need to simply to help his resting heart rate.      History of nonadherence to medical treatment (Chronic)     Doing better with taking his meds now. I wonder if that's why his lipids are not was well controlled as he been away from his statin for a while.      Hyperlipidemia with target LDL less than 70; by his report he is back on Crestor (Chronic)     Lipid profile shows that his LDL is not at goal. He is on Crestor 20 mg daily. We may need to consider Zetia or increased dose of Crestor 40 mg. Can discuss follow-up.      Other fatigue (Chronic)     I need to exclude ischemic etiology for his decreased exercise tolerance, in the past that has been somewhat psychological in nature.   I certainly would not want to reduce his beta blocker, because his heart rate was 103 bpm      Relevant Orders   Myocardial Perfusion Imaging   ST elevation myocardial infarction (STEMI) of  anterior wall -- PCI to LAD (Chronic)     He is actually had both inferior and anterior STEMI's. The anterior wall seems to not be as significantly scarred. He has had extensive PCI to the LAD. With recurrent exertional dyspnea  As well as chest pain, we are checking a Myoview.      Relevant Orders   Myocardial Perfusion Imaging   ST elevation myocardial infarction (STEMI) of inferior wall (HCC) (Chronic)     I suspect that this was the more significant of his  MIs as there is a large scar noted on previous Myoview as well as an echocardiogram.      Stable angina (HCC) (Chronic)     Had been pretty well controlled, but now having to use nitroglycerin and noticing worsening exertional dyspnea.   No longer on Imdur. -- Evaluate for recurrent ischemia with Myoview stress          again I talked to him very briefly about smoking. He just is unable to make that last final stop. I think he is using cigarettes as a crutch.  Current medicines are reviewed at length with the patient today. (+/- concerns)  None The  following changes have been made:  None for now. May need to add Imdur  Patient Instructions  No change with current medications  Schedule at [address] Your physician has requested that you have en exercise stress myoview. For further information please visit [email or web address]. Please follow instruction sheet, as given.    Your physician recommends that you schedule a follow-up appointment in  After test is completed.   If you need a refill on your cardiac medications before your next appointment, please call your pharmacy.    Studies Ordered:   Orders Placed This Encounter  Procedures  . Myocardial Perfusion Imaging      Glenetta Hew, M.D., M.S. Interventional Cardiologist   Pager # [phone] Phone # [phone] [address] [place],  [address]

## 2016-04-16 NOTE — Patient Instructions (Signed)
No change with current medications  Schedule at [address] Your physician has requested that you have en exercise stress myoview. For further information please visit [email or web address]. Please follow instruction sheet, as given.    Your physician recommends that you schedule a follow-up appointment in  After test is completed.   If you need a refill on your cardiac medications before your next appointment, please call your pharmacy.

## 2016-04-18 ENCOUNTER — Encounter: Payer: Self-pay | Admitting: Cardiology

## 2016-04-18 NOTE — Assessment & Plan Note (Signed)
With his history of known CAD, having had to take nitroglycerin twice for chest pain and having exertional dyspnea now, I am concerned he may have some progression of disease. We are not able to keep his lipids as well controlled as desired, and he still is smoking. His last stress test showedsignificant inferior infarct, which we would expect to see again. However I do think with his desire to join a gym we need to exclude any worsening ischemic disease.   Plan: Relook Myoview

## 2016-04-18 NOTE — Assessment & Plan Note (Signed)
Had been pretty well controlled, but now having to use nitroglycerin and noticing worsening exertional dyspnea.   No longer on Imdur. -- Evaluate for recurrent ischemia with Myoview stress

## 2016-04-18 NOTE — Assessment & Plan Note (Signed)
He is actually had both inferior and anterior STEMI's. The anterior wall seems to not be as significantly scarred. He has had extensive PCI to the LAD. With recurrent exertional dyspnea  As well as chest pain, we are checking a Myoview.

## 2016-04-18 NOTE — Assessment & Plan Note (Signed)
I need to exclude ischemic etiology for his decreased exercise tolerance, in the past that has been somewhat psychological in nature.   I certainly would not want to reduce his beta blocker, because his heart rate was 103 bpm

## 2016-04-18 NOTE — Assessment & Plan Note (Signed)
Interestingly, he now has a heart rate of 103 bpm on low-dose Toprol.With his fatigue, I am reluctant to titrated up, but we may need to simply to help his resting heart rate.

## 2016-04-18 NOTE — Assessment & Plan Note (Signed)
Seems to be relatively well compensated. Not requiring diuretic as he is euvolemic. He is on a good dose of ARB and Toprol.

## 2016-04-18 NOTE — Assessment & Plan Note (Addendum)
Lipid profile shows that his LDL is not at goal. He is on Crestor 20 mg daily. We may need to consider Zetia or increased dose of Crestor 40 mg. Can discuss follow-up.

## 2016-04-18 NOTE — Assessment & Plan Note (Signed)
Reevaluating for ischemia with Myoview stress test. For now continue current dose of ARB, calcium channel blocker as well as aspirin/Effient and Crestor.

## 2016-04-18 NOTE — Assessment & Plan Note (Signed)
I suspect that this was the more significant of his  MIs as there is a large scar noted on previous Myoview as well as an echocardiogram.

## 2016-04-18 NOTE — Assessment & Plan Note (Signed)
Doing better with taking his meds now. I wonder if that's why his lipids are not was well controlled as he been away from his statin for a while.

## 2016-04-22 ENCOUNTER — Telehealth (HOSPITAL_COMMUNITY): Payer: Self-pay

## 2016-04-22 NOTE — Telephone Encounter (Signed)
Encounter complete. 

## 2016-04-25 HISTORY — PX: NM MYOVIEW LTD: HXRAD82

## 2016-04-27 ENCOUNTER — Ambulatory Visit (HOSPITAL_COMMUNITY)
Admission: RE | Admit: 2016-04-27 | Discharge: 2016-04-27 | Disposition: A | Discharge status: Home / Self Care | Payer: PPO | Source: Ambulatory Visit | Attending: Cardiovascular Disease | Admitting: Cardiovascular Disease

## 2016-04-27 DIAGNOSIS — I2109 ST elevation (STEMI) myocardial infarction involving other coronary artery of anterior wall: Secondary | ICD-10-CM | POA: Insufficient documentation

## 2016-04-27 DIAGNOSIS — R079 Chest pain, unspecified: Secondary | ICD-10-CM | POA: Insufficient documentation

## 2016-04-27 DIAGNOSIS — R5383 Other fatigue: Secondary | ICD-10-CM | POA: Insufficient documentation

## 2016-04-27 DIAGNOSIS — I255 Ischemic cardiomyopathy: Secondary | ICD-10-CM | POA: Diagnosis not present

## 2016-04-27 LAB — MYOCARDIAL PERFUSION IMAGING
Estimated workload: 9.4 METS
Exercise duration (min): 9 min
Exercise duration (sec): 0 s
LV dias vol: 69 mL (ref 62–150)
LV sys vol: 30 mL
MPHR: 165 {beats}/min
Peak HR: 150 {beats}/min
Percent HR: 90 %
RPE: 17
Rest HR: 88 {beats}/min
SDS: 6
SRS: 1
SSS: 6
TID: 0.92

## 2016-04-27 MED ORDER — TECHNETIUM TC 99M TETROFOSMIN IV KIT
29.5000 | PACK | Freq: Once | INTRAVENOUS | Status: AC | PRN
  Administered 2016-04-27: 29.5 via INTRAVENOUS
  Filled 2016-04-27: qty 30

## 2016-04-27 MED ORDER — TECHNETIUM TC 99M TETROFOSMIN IV KIT
9.9000 | PACK | Freq: Once | INTRAVENOUS | Status: AC | PRN
  Administered 2016-04-27: 9.9 via INTRAVENOUS
  Filled 2016-04-27: qty 10

## 2016-04-28 ENCOUNTER — Telehealth: Payer: Self-pay

## 2016-04-28 MED ORDER — ROSUVASTATIN CALCIUM 20 MG PO TABS: 20.0000 mg | ORAL_TABLET | Freq: Every day | ORAL | 6 refills | Status: DC

## 2016-04-28 MED ORDER — EFFIENT 10 MG PO TABS: ORAL_TABLET | ORAL | 6 refills | Status: DC

## 2016-04-28 MED ORDER — OMEPRAZOLE 20 MG PO CPDR: 20.0000 mg | DELAYED_RELEASE_CAPSULE | Freq: Every day | ORAL | 6 refills | Status: DC

## 2016-04-28 MED ORDER — LOSARTAN POTASSIUM 25 MG PO TABS: 25.0000 mg | ORAL_TABLET | Freq: Every day | ORAL | 6 refills | Status: DC

## 2016-04-28 MED ORDER — METOPROLOL SUCCINATE ER 25 MG PO TB24: 25.0000 mg | ORAL_TABLET | Freq: Every day | ORAL | 6 refills | Status: DC

## 2016-04-28 NOTE — Telephone Encounter (Signed)
Patient walked in 04/27/16 requesting medication refills.Refills sent to pharmacy.

## 2016-04-30 ENCOUNTER — Telehealth: Payer: Self-pay | Admitting: Cardiology

## 2016-04-30 NOTE — Telephone Encounter (Signed)
Pt wanted to know if it is alright if he can workout in the gym before his appt on 05-13-16 ?

## 2016-05-02 ENCOUNTER — Other Ambulatory Visit: Payer: Self-pay | Admitting: Internal Medicine

## 2016-05-03 NOTE — Telephone Encounter (Signed)
[name].  INFORMED HIM WILL DEFER TO DR HARDING.  PATIENT STATES HE WILL JUST WAIT UNTIL HE SEE DR [facility] AT 05/13/16 APPOINTMENT.  RN IN FORMED PATIENT CONTINUE TO MONITOR SYMPTOMS IF NTG DOES NOT WORK X3   CALL 911 AND GO TO ER . PATIENT VOICED UNDERSTANDING.

## 2016-05-03 NOTE — Telephone Encounter (Signed)
Yes - as tolerated.  Will help determine next step.  Glenetta Hew, MD

## 2016-05-04 NOTE — Telephone Encounter (Signed)
Left detailed messge(DPR) as per [facility] message

## 2016-05-05 ENCOUNTER — Ambulatory Visit: Payer: Self-pay | Admitting: Pulmonary Disease

## 2016-05-13 ENCOUNTER — Encounter: Payer: Self-pay | Admitting: Cardiology

## 2016-05-13 ENCOUNTER — Ambulatory Visit (INDEPENDENT_AMBULATORY_CARE_PROVIDER_SITE_OTHER): Payer: PPO | Admitting: Cardiology

## 2016-05-13 VITALS — BP 100/80 | HR 80 | Ht 72.0 in | Wt 186.2 lb

## 2016-05-13 DIAGNOSIS — I255 Ischemic cardiomyopathy: Secondary | ICD-10-CM

## 2016-05-13 DIAGNOSIS — E785 Hyperlipidemia, unspecified: Secondary | ICD-10-CM

## 2016-05-13 DIAGNOSIS — D689 Coagulation defect, unspecified: Secondary | ICD-10-CM | POA: Diagnosis not present

## 2016-05-13 DIAGNOSIS — Z9861 Coronary angioplasty status: Secondary | ICD-10-CM | POA: Diagnosis not present

## 2016-05-13 DIAGNOSIS — I5042 Chronic combined systolic (congestive) and diastolic (congestive) heart failure: Secondary | ICD-10-CM

## 2016-05-13 DIAGNOSIS — I952 Hypotension due to drugs: Secondary | ICD-10-CM | POA: Diagnosis not present

## 2016-05-13 DIAGNOSIS — Z01810 Encounter for preprocedural cardiovascular examination: Secondary | ICD-10-CM

## 2016-05-13 DIAGNOSIS — I209 Angina pectoris, unspecified: Secondary | ICD-10-CM

## 2016-05-13 DIAGNOSIS — R9439 Abnormal result of other cardiovascular function study: Secondary | ICD-10-CM

## 2016-05-13 DIAGNOSIS — I2510 Atherosclerotic heart disease of native coronary artery without angina pectoris: Secondary | ICD-10-CM | POA: Diagnosis not present

## 2016-05-13 LAB — CBC
HCT: 49.7 % (ref 38.5–50.0)
Hemoglobin: 16.9 g/dL (ref 13.2–17.1)
MCH: 30.4 pg (ref 27.0–33.0)
MCHC: 34 g/dL (ref 32.0–36.0)
MCV: 89.4 fL (ref 80.0–100.0)
MPV: 9.3 fL (ref 7.5–12.5)
Platelets: 389 10*3/uL (ref 140–400)
RBC: 5.56 MIL/uL (ref 4.20–5.80)
RDW: 14.2 % (ref 11.0–15.0)
WBC: 12 10*3/uL — ABNORMAL HIGH (ref 3.8–10.8)

## 2016-05-13 LAB — BASIC METABOLIC PANEL
BUN: 17 mg/dL (ref 7–25)
CO2: 25 mmol/L (ref 20–31)
Calcium: 9.4 mg/dL (ref 8.6–10.3)
Chloride: 106 mmol/L (ref 98–110)
Creat: 1.01 mg/dL (ref 0.70–1.33)
Glucose, Bld: 104 mg/dL — ABNORMAL HIGH (ref 65–99)
Potassium: 5.3 mmol/L (ref 3.5–5.3)
Sodium: 139 mmol/L (ref 135–146)

## 2016-05-13 MED ORDER — AMLODIPINE BESYLATE 2.5 MG PO TABS: 2.5000 mg | ORAL_TABLET | Freq: Every day | ORAL | 3 refills | Status: DC

## 2016-05-13 NOTE — Assessment & Plan Note (Signed)
He is status post multiple different PCI's including all 3 major vascular beds. Several different stents in the LAD and then most recently the RCA distally with PTCA of both the posterior lateral system and the PDA. With recurrent anginal symptoms, plan is to proceed to cardiac catheterization. He remains on Effient plus aspirin, beta blocker and losartan. He is on Crestor. He has been intolerant of Imdur and Ranexa in the past.

## 2016-05-13 NOTE — Progress Notes (Signed)
 PCP: BAITY, REGINA, NP  Clinic Note: Chief Complaint  Patient presents with  . Follow-up    f/u NUC results , pt c/o dizziness  . Coronary Artery Disease    Exertional chest tightness and shortness of breath    HPI: Troy Parrish is a 55 y.o. male with a PMH below who presents today for 1 month month f/u for significant MV CAD - s/p at least 3 STEMIs. -- He is here to discuss results of his Myoview stress test  Ein Nicotra was last seen on 04/16/2016: Although remaining happy, he was started to note some exercise intolerance with worsening dyspnea. May be some chest tightness associated with exertion once or twice. He has taken nitroglycerin. -> Was evaluated with Myoview stress test: Prior to that in October 2017, he was doing relatively well. He was happy, was doing activities with his family including traveling. He did note exertional dyspnea but only if he over did it. Relatively well-controlled anginal symptoms. One issue with him is been up titration of his beta blocker dose.  Recent Hospitalizations:  none  Studies Reviewed:    Myoview 04/25/2016: Exercised for 9 minutes. Reached 90% max. Heart rate. 9.4 METS. Read as a low risk study with normal EF 55-65%. -> On my review, there does appear to be a small area of concern in the inferior wall. Based on that I wanted to reassess his symptoms. If symptoms persist, we would probably want to consider relook catheterization.  Interval History: Troy Parrish presents today Stating that he feels a little better and he did last time I saw him, but is still noticing intermittent episodes of chest tightness along with his shortness of breath with moderate amount of activity walking around the house carrying out the trash. He is more cognizant of it now because it is been going on for a while. He states that these are the symptoms that he started having leading up to the last time we did a cath with multiple vessel stenting. He is having the shortness of  breath and the tightness across his chest more frequently and it lasts a little bit longer. He is using some nitroglycerin. He's been reluctant to try Imdur again because of bad headache. He also did not do well with Ranexa in the past. He is still having some dizziness spells and is noticing it today as well. Less associated with a little bit of fatigue. He is not having any PND or orthopnea. No edema. No rapid irregular heartbeats or palpitations. Although dizzy, no sig. Near syncope. No TIA or amaurosis fugax symptoms.   He wants to join a gym with his family and do routine exercise, but is scared with is now having more exertional dyspnea and chest tightness. No claudication.   He still indicates that hehas been unable to fully quit smoking. Every time quits goes right back to smoking them. Not much, still is smoking occasionally.  ROS: A comprehensive was performed. Review of Systems  Constitutional: Positive for malaise/fatigue (Less energy than usual).  HENT: Negative for nosebleeds.   Respiratory: Positive for shortness of breath. Negative for cough and wheezing.   Gastrointestinal: Negative for blood in stool and melena.  Genitourinary: Negative for hematuria.  Musculoskeletal: Negative for falls.  Skin: Negative.   Neurological: Positive for dizziness (He is noting vertigo symptoms). Negative for focal weakness.  Psychiatric/Behavioral: Negative for depression (Seems like more dysthymia) and memory loss. The patient has insomnia (Not really sleeping well.). The patient is not   nervous/anxious.   All other systems reviewed and are negative.   Past Medical History:  Diagnosis Date  . CAD S/P percutaneous coronary angioplasty 07/07/2011; 6 & 10 2015   S/P PCI to all 3 major vessels;a)  Ant STEMI 2001- BMS-LAD x2 -->(redo PCI 6/'15 -- pLAD Xience DES 2.5 x 12- 2.75 mm, mLAD 2.25 x 12 - 2.7 mm), b) '06 UA --> Cx- OM DES; c) 2013 Inf MI BMS mRCA --> d) 10/'15 PCI dRCA Promus P DES 4.0 x  16 (4.25 mm); PTCA of RPL2 (2.0 mm) &RPDA (2.25 mm) 11/2013  . Chronic bronchitis (HCC) "used to get it q yr"  . COPD (chronic obstructive pulmonary disease) (HCC)   . Elevated WBC count   . Emphysema of lung (HCC)   . Essential hypertension 07/07/2011  . Former heavy tobacco smoker     quit in May 2015 after multiple attempts at trying to quit before   . GERD (gastroesophageal reflux disease)   . Glucose intolerance (pre-diabetes) June 2015    hemoglobin A1c 6.6  . Headache    "Imdur related; stopped taking it; headaches went away" (11/15/2013)  . Heart murmur   . History of colon polyps   . History of stomach ulcers   . Hyperlipidemia with target LDL less than 70 07/07/2011  . Metabolic syndrome    Pre-diabetes, hypertension and truncal obesity as well as dyslipidemia  . OSA on CPAP   . Pneumonia "several times"  . Recurrent upper respiratory infection (URI)   . Seizures (HCC) "several"   "last one was ~ 2011" (11/15/2013)  . Shortness of breath dyspnea   . ST elevation myocardial infarction (STEMI) of anterior wall (HCC) 2001   History of -- 2 stents in early and distal mid LAD; prior cardiologist was Dr. Jose Jacobs in [place]  . ST elevation myocardial infarction (STEMI) of inferior wall (HCC) 07/07/2011   Occluded RCA 2.75X18 INTEGRITY; Echo 07/2013: EF 45-50%, Inferior & Posterolateral HK.   . Umbilical hernia   . Unstable angina (HCC)  2006; June 2015   Cx-OM - PCI 2.5 mm 13 mm Cypher DES ([place]) ; 07/2013: Severe ISR of both prox & Distal LAD stentS --> 2 DES stents (1 at prox edge of the proximal stent, 2nd covers the entire distal stent as well as proximal and distal edge stenose)   . Upper respiratory infection     Past Surgical History:  Procedure Laterality Date  . Cardiac MRI [facility]  09/2014   EF 51%. Mod HK of basal-mid Inf wall, mild HK of basal inferoseptum & basal-mid inferolateral wall with hyper enhancement (c/w subendocard RCA MI with significant  viability), focal hyper enhancement of apical wall c/w dLAD subendocard MI & complete LAD viability. . No aortic stenosis. Normal RV function. No Ischemia on Adenosine Stress.  . COLONOSCOPY WITH PROPOFOL N/A 03/28/2015   Procedure: COLONOSCOPY WITH PROPOFOL;  Surgeon: Darren Wohl, MD;  Location: [facility];  Service: Endoscopy;  Laterality: N/A;  . CORONARY ANGIOPLASTY WITH STENT PLACEMENT  2001   ANTERIOR mi with a  PCI to LAD; [place], [place] - Dr. Saha  . CORONARY ANGIOPLASTY WITH STENT PLACEMENT  2006   [place] by Dr Saha -lesion lft circ/OM1 with 2.5 x 13mm Cypher DES  . ESOPHAGOGASTRODUODENOSCOPY (EGD) WITH PROPOFOL N/A 03/28/2015   Procedure: ESOPHAGOGASTRODUODENOSCOPY (EGD) WITH PROPOFOL;  Surgeon: Darren Wohl, MD;  Location: [facility];  Service: Endoscopy;  Laterality: N/A;  . INSERTION OF MESH N/A 04/02/2015     Procedure: INSERTION OF MESH;  Surgeon: Richard E Cooper, MD;  Location: [facility];  Service: General;  Laterality: N/A;  . LEFT AND RIGHT HEART CATHETERIZATION WITH CORONARY ANGIOGRAM N/A 07/30/2013   Procedure: LEFT AND RIGHT HEART CATHETERIZATION WITH CORONARY ANGIOGRAM;  Surgeon: David W Harding, MD;  [facility];  Service: Cardiovascular;  2 separate P & mLAD lesions -> PCI, MOd-Severe dRCA disesase with diffuse RPL/PDA disease (FFR)  . LEFT HEART CATH  08/03/2013   Procedure: LEFT HEART CATH;  Surgeon: David W Harding, MD;  [facility];  Service: Cardiovascular;;FFR of RCA - non-flow-limiting  . LEFT HEART CATHETERIZATION WITH CORONARY ANGIOGRAM N/A 07/07/2011   Procedure: LEFT HEART CATHETERIZATION WITH CORONARY ANGIOGRAM;  Surgeon: David W Harding, MD;  [facility];  Service: Cardiovascular;  inferolat post LV infarct ST-elevation MI  . MET/CPET  11/09/2011   submax. effort 1.05 RER peak V02 was 51% ,chronotropic incomp.hrt rate lows 80s to 100  . MET/CPET  September 2016   [facility]: Mild functional impairment due primarily to  mild pulmonary and circulatory limitations. Also suggest physical deconditioning (seen with low-normal aerobic reserve). Mild VQ mismatch with exercise.  Ventilatory reserve exhausted with peak exercise demonstrated pulmonary limitation. No significant decrease in postexercise FEV1 compared to rest --> suggest no exercise induced bronchospasm  . NM MYOVIEW ([facility] HX)  11/30/2011   EF 45% ,exercise 10 METS, infarct\scar w mild perinfarct ischemia -basal inferolat and mid inferolat region  . NM MYOVIEW LTD  04/25/2016   Exercised for 9 minutes. Reached 90% max. Heart rate. 9.4 METS. Read as a low risk study with normal EF 55-65%. -> on Review -  there does appear to be a small to moderate sized, medium severtiy reversible perfusion defect in the anterior wall - read by computer, but not noted by reader.  . PERCUTANEOUS CORONARY STENT INTERVENTION (PCI-S)  07/07/2011   Procedure: PERCUTANEOUS CORONARY STENT INTERVENTION (PCI-S);  Surgeon: David W Harding, MD;  [facility];  Service: Cardiovascular;;occluded RCA ,Integrity Resolute DES 2.75 x 18 mm stent - post-dilated 3.1 mm  . PERCUTANEOUS CORONARY STENT INTERVENTION (PCI-S)  07/30/2013   Procedure: PERCUTANEOUS CORONARY STENT INTERVENTION (PCI-S);  Surgeon: David W Harding, MD;  [facility];  Service: Cardiovascular;;Distal mid LAD: Xience Alpine DES 2.25 mm x 28 mm (overlapping proximal and distal edge of previous stent) - 2.7 mm; proximal LAD 2.5 mm x 12 mm Xience Alpine DES (2.8 mm);; RCA 60-70% stenosis planned staged procedure  . PERCUTANEOUS CORONARY STENT INTERVENTION (PCI-S) N/A 11/15/2013   Procedure: PERCUTANEOUS CORONARY STENT INTERVENTION (PCI-S);  Surgeon: David W Harding, MD;  [facility];  Service: Cardiovascular;  Patent Cx & LAD Stents; PCI -dRCA Promus Premier DES 4.0 mm x 16 mm (4.25 mm) dRCA, 2.0 mm Cutting PTCA of RPL2 [place] & POBA of mRPDA  . POLYPECTOMY  03/28/2015   Procedure: POLYPECTOMY;  Surgeon: Darren  Wohl, MD;  Location: [facility];  Service: Endoscopy;;  . RIGHT HEART CATH  June 2015   Normal pressures with severely reduced cardiac output and index. (3.25/1.55)  . TRANSTHORACIC ECHOCARDIOGRAM  07/31/2013; February 2017   a. LVEF 45-50. Inferior posterior hypokinesis with mild dilation. Apparent normal diastolic pressures;   . UMBILICAL HERNIA REPAIR N/A 04/02/2015   Procedure: HERNIA REPAIR UMBILICAL ADULT;  Surgeon: Richard E Cooper, MD;  Location: [facility];  Service: General;  Laterality: N/A;    Current Meds  Medication Sig  . albuterol (PROVENTIL   HFA) 108 (90 Base) MCG/ACT inhaler Inhale 1 puff into the lungs every 6 (six) hours as needed. (Patient taking differently: Inhale 1 puff into the lungs every 6 (six) hours as needed for wheezing or shortness of breath. )  . aspirin 81 MG EC tablet Take 1 tablet (81 mg total) by mouth at bedtime.  . EFFIENT 10 MG TABS tablet TAKE 1 TABLET (10 MG TOTAL) BY MOUTH DAILY. (Patient taking differently: Take 10 mg by mouth every evening. )  . metoprolol succinate (TOPROL-XL) 25 MG 24 hr tablet Take 1 tablet (25 mg total) by mouth daily. Take with or immediately following a meal. (Patient taking differently: Take 25 mg by mouth every evening. Take with or immediately following a meal.)  . nitroGLYCERIN (NITROSTAT) 0.4 MG SL tablet Place 1 tablet (0.4 mg total) under the tongue every 5 (five) minutes as needed for chest pain.  . omeprazole (PRILOSEC) 20 MG capsule Take 1 capsule (20 mg total) by mouth daily. (Patient taking differently: Take 20 mg by mouth every evening. )  . rosuvastatin (CRESTOR) 20 MG tablet Take 1 tablet (20 mg total) by mouth daily. (Patient taking differently: Take 20 mg by mouth every evening. )  . [DISCONTINUED] losartan (COZAAR) 25 MG tablet Take 1 tablet (25 mg total) by mouth daily.    Allergies  Allergen Reactions  . Niacin And Related Hives and Itching    Social History   Social History  . Marital status:  Married    Spouse name: N/A  . Number of children: N/A  . Years of education: N/A   Social History Main Topics  . Smoking status: Current Every Day Smoker    Packs/day: 0.50    Years: 40.00    Types: Cigarettes    Last attempt to quit: 06/19/2013  . Smokeless tobacco: Never Used     Comment: Quit with Bupropion 150mg - > unfortunately, he restarted  . Alcohol use 0.6 oz/week    1 Cans of beer per week     Comment: rare  . Drug use: No  . Sexual activity: Yes   Other Topics Concern  . None   Social History Narrative   He is a married father of 8, grandfather of 8. He does not really get routine exercise.    He is down to 5 or 6 cigarettes a day. He is very seriously wanting to quit. This is a significant cutback for him, but he has pretty much been told he needs to stop by his wife, and so he fully intends to do so. He is willing to try the patches or whatever. He has a social alcoholic beverage every now and then.     family history includes Cancer in his maternal aunt; Coronary artery disease in his father; Diabetes in his maternal grandfather, maternal grandmother, and mother; Emphysema in his maternal grandfather; Heart disease in his father, paternal grandfather, and paternal grandmother; Hyperlipidemia in his maternal grandfather, maternal grandmother, and mother; Hypertension in his father; Stroke in his father.  Wt Readings from Last 3 Encounters:  05/13/16 186 lb 3.2 oz (84.5 kg)  04/27/16 183 lb (83 kg)  04/16/16 183 lb 12.8 oz (83.4 kg)    PHYSICAL EXAM BP 100/80   Pulse 80   Ht 6' (1.829 m)   Wt 186 lb 3.2 oz (84.5 kg)   BMI 25.25 kg/m  General appearance: alert, cooperative, appears stated age, he no longer seems depressed. He actually has more color than usual today   and in somewhat better spirits. Well-nourished and well-groomed  HEENT: [place]/AT, EOMI, MMM, anicteric sclera Neck: no adenopathy, no carotid bruit, no JVD and supple, symmetrical, trachea midline   Lungs: diminished breath sounds bilaterally, wheezes bilaterally and Otherwise the most part CPAP. Nonlabored. Increased AP diameter. Prolonged expiratory phase  Heart: RRR with ectopy, S1, S2 normal, no murmur, click, rub or gallop and normal apical impulse  Abdomen: soft, non-tender; bowel sounds normal; no masses, no organomegaly and Mildly protuberant  Extremities: extremities normal, atraumatic, no cyanosis or edema  Pulses: 2+ and symmetric  Neurologic: Mental status: Alert, oriented, thought content appropriate, affect: blunted and mood-congruent    Adult ECG Report n/a   Other studies Reviewed: Additional studies/ records that were reviewed today include:  Recent Labs:   Lab Results  Component Value Date   CHOL 188 03/30/2016   HDL 38.40 (L) 03/30/2016   LDLCALC 114 (H) 03/30/2016   LDLDIRECT 109.0 09/03/2015   TRIG 177.0 (H) 03/30/2016   CHOLHDL 5 03/30/2016   Lab Results  Component Value Date   CREATININE 0.77 03/30/2016   BUN 11 03/30/2016   NA 138 03/30/2016   K 4.3 03/30/2016   CL 109 03/30/2016   CO2 25 03/30/2016    ASSESSMENT / PLAN: Problem List Items Addressed This Visit    Abnormal nuclear stress test    Stress test was read as low risk, however on my review I considered to be at least intermediate risk in a patient with symptoms. With his recurrent persistent symptoms that are very similar to his prior angina, I have to consider that this is a possible ischemic territory. Plan: Evaluate with cardiac catheterization.      Relevant Orders   Basic Metabolic Panel (BMET)   PTT   INR/PT   CBC   LEFT HEART CATHETERIZATION WITH CORONARY ANGIOGRAM   Angina, class II (HCC) - Primary    Up until the last couple months, the symptoms been very well controlled. Now he is having more concerning symptoms of exertional chest tightness and dyspnea. He is now reluctant to do things that he was doing. As a result he is starting to become more depressed and  anxious. The stress test was read as low risk, however I clearly see on the images that there is about possible reversible inferior defect and knowing that I did angioplasty on both the PL system and the PDA system as well as 2 places in the RCA, I think we are warranted to proceed with cardiac catheterization. He is desperate to find out was going on.  Plan after much discussion, we decided to proceed with cardiac catheterization, plus/minus PCI.  He is well aware of the risks, benefits alternatives and indications of the procedure.   What I would like to do for his anginal symptoms is to stop losartan, given the couple days to let us dizziness resolved and then*I starting low-dose amlodipine 2.5 mg daily at bedtime.We reviewed them in detail and he agrees to proceed.  We will schedule him for next Wednesday via right radial approach.      Relevant Medications   amLODipine (NORVASC) 2.5 MG tablet   Other Relevant Orders   Basic Metabolic Panel (BMET)   PTT   INR/PT   CBC   LEFT HEART CATHETERIZATION WITH CORONARY ANGIOGRAM   CAD S/P percutaneous coronary angioplasty (Chronic)    He is status post multiple different PCI's including all 3 major vascular beds. Several different stents in the LAD and then   most recently the RCA distally with PTCA of both the posterior lateral system and the PDA. With recurrent anginal symptoms, plan is to proceed to cardiac catheterization. He remains on Effient plus aspirin, beta blocker and losartan. He is on Crestor. He has been intolerant of Imdur and Ranexa in the past.       Relevant Medications   amLODipine (NORVASC) 2.5 MG tablet   Other Relevant Orders   LEFT HEART CATHETERIZATION WITH CORONARY ANGIOGRAM   Cardiomyopathy, ischemic; EF~45% with hypokinesis of the inferior and inferolateral myocardium; CO 3.25 (Chronic)    No real heart failure symptoms. Stable EF. He is having some class II symptoms of dyspnea and angina which were downgoing  evaluate with cardiac catheterization. Because of his dizziness I am stopping losartan in order to allow using amlodipine for antianginal effect. Otherwise continue current dose of Toprol. Euvolemic with no diuretic requirement      Relevant Medications   amLODipine (NORVASC) 2.5 MG tablet   Other Relevant Orders   LEFT HEART CATHETERIZATION WITH CORONARY ANGIOGRAM   Chronic combined systolic and diastolic congestive heart failure, NYHA class 2 (HCC) (Chronic)    Euvolemic. No diuretic requirement. He is on stable dose of Toprol. Replacing ARB with calcium channel blocker for antianginal effect.      Relevant Medications   amLODipine (NORVASC) 2.5 MG tablet   Hyperlipidemia with target LDL less than 70; by his report he is back on Crestor (Chronic)    Back on Crestor now. Depending what we see in the Cath Lab, we may need to try to increase to 40 mg and potentially add Zetia. Otherwise, would consider referral for considering PCSK9 inhibitor.      Relevant Medications   amLODipine (NORVASC) 2.5 MG tablet   Hypotension due to drugs    Know if his dizziness and lightheadedness, I am concerned with his hypotension as opposed to hypertension. We have gradually pulled off medications. This time the plan will be to cut losartan altogether. I would like to add antianginal amlodipine until we can do his cardiac catheterization. DC losartan, add amlodipine 2.5 mg daily.      Relevant Medications   amLODipine (NORVASC) 2.5 MG tablet    Other Visit Diagnoses    Preprocedural cardiovascular examination       Relevant Orders   Basic Metabolic Panel (BMET)   PTT   INR/PT   CBC   Clotting disorder (HCC)       Relevant Orders   PTT   INR/PT      Current medicines are reviewed at length with the patient today. (+/- concerns)  None The following changes have been made:  None for now. May need to add Imdur  Patient Instructions      Medication Instructions: STOP losartan today. In 3  days, start taking amlodipine 2.5mg daily in the evening.   Labwork: 1 week prior to cath   Testing/Procedures:  Your physician has requested that you have a cardiac catheterization. Cardiac catheterization is used to diagnose and/or treat various heart conditions. Doctors may recommend this procedure for a number of different reasons. The most common reason is to evaluate chest pain. Chest pain can be a symptom of coronary artery disease (CAD), and cardiac catheterization can show whether plaque is narrowing or blocking your heart's arteries. This procedure is also used to evaluate the valves, as well as measure the blood flow and oxygen levels in different parts of your heart. For further information please visit www.cardiosmart.org. Please   follow instruction sheet, as given.  RIGHT RADIAL - SCHEDULE WITH DR. HARDING   Follow-Up: 2 weeks after cath    If you need a refill on your cardiac medications before your next appointment, please call your pharmacy.    [facility] [address] [place] [address] Dept: [phone] Loc: [phone]  Culley Duva  05/13/2016  You are scheduled for a Cardiac Catheterization         1. Please arrive at the [facility] Tower (Main Entrance A) at [facility]: [address] [place], [place] [address] two hours before your procedure to ensure your preparation. Free valet parking service is available.   Special note: Every effort is made to have your procedure done on time. Please understand that emergencies sometimes delay scheduled procedures.  2. Diet: Do not eat or drink anything after midnight prior to your procedure except sips of water to take medications.  3. Labs: You will need to have preprocedure labwork done at [facility] no more than 2 weeks prior to your test. This does not need to be fasting. There is a lab on the 1st floor of our  building.  4. Medication instructions in preparation for your procedure:  On the morning of your procedure, take your Effient/Prasugrel and any morning medicines.  You may use sips of water.  5. Plan for one night stay--bring personal belongings. 6. Bring a current list of your medications and current insurance cards. 7. You MUST have a responsible person to drive you home. 8. Someone MUST be with you the first 24 hours after you arrive home or your discharge will be delayed. 9. Please wear clothes that are easy to get on and off and wear slip-on shoes.  Thank you for allowing us to care for you!   -- [facility]    Studies Ordered:   Orders Placed This Encounter  Procedures  . Basic Metabolic Panel (BMET)  . PTT  . INR/PT  . CBC  . LEFT HEART CATHETERIZATION WITH CORONARY ANGIOGRAM      David Harding, M.D., M.S. Interventional Cardiologist   Pager # [phone] Phone # [phone] [address] [place], [place] [address] 

## 2016-05-13 NOTE — Assessment & Plan Note (Addendum)
Up until the last couple months, the symptoms been very well controlled. Now he is having more concerning symptoms of exertional chest tightness and dyspnea. He is now reluctant to do things that he was doing. As a result he is starting to become more depressed and anxious. The stress test was read as low risk, however I clearly see on the images that there is about possible reversible inferior defect and knowing that I did angioplasty on both the PL system and the PDA system as well as 2 places in the RCA, I think we are warranted to proceed with cardiac catheterization. He is desperate to find out was going on.  Plan after much discussion, we decided to proceed with cardiac catheterization, plus/minus PCI.  He is well aware of the risks, benefits alternatives and indications of the procedure.   What I would like to do for his anginal symptoms is to stop losartan, given the couple days to let us dizziness resolved and then*I starting low-dose amlodipine 2.5 mg daily at bedtime.We reviewed them in detail and he agrees to proceed.  We will schedule him for next Wednesday via right radial approach.

## 2016-05-13 NOTE — Assessment & Plan Note (Signed)
No real heart failure symptoms. Stable EF. He is having some class II symptoms of dyspnea and angina which were downgoing evaluate with cardiac catheterization. Because of his dizziness I am stopping losartan in order to allow using amlodipine for antianginal effect. Otherwise continue current dose of Toprol. Euvolemic with no diuretic requirement

## 2016-05-13 NOTE — Assessment & Plan Note (Signed)
Back on Crestor now. Depending what we see in the Cath Lab, we may need to try to increase to 40 mg and potentially add Zetia. Otherwise, would consider referral for considering PCSK9 inhibitor.

## 2016-05-13 NOTE — Assessment & Plan Note (Signed)
Euvolemic. No diuretic requirement. He is on stable dose of Toprol. Replacing ARB with calcium channel blocker for antianginal effect.

## 2016-05-13 NOTE — Patient Instructions (Addendum)
    Medication Instructions: STOP losartan today. In 3 days, start taking amlodipine 2.5mg  daily in the evening.   Labwork: 1 week prior to cath   Testing/Procedures:  Your physician has requested that you have a cardiac catheterization. Cardiac catheterization is used to diagnose and/or treat various heart conditions. Doctors may recommend this procedure for a number of different reasons. The most common reason is to evaluate chest pain. Chest pain can be a symptom of coronary artery disease (CAD), and cardiac catheterization can show whether plaque is narrowing or blocking your heart's arteries. This procedure is also used to evaluate the valves, as well as measure the blood flow and oxygen levels in different parts of your heart. For further information please visit [email or web address]. Please follow instruction sheet, as given.  RIGHT RADIAL - SCHEDULE WITH DR. HARDING   Follow-Up: 2 weeks after cath    If you need a refill on your cardiac medications before your next appointment, please call your pharmacy.    [facility] [address] [place] [address] Dept: [phone] Loc: [place]  05/13/2016  You are scheduled for a Cardiac Catheterization         1. Please arrive at the [facility] (Main Entrance A) at [facility]: [address] [place], [place] [address] two hours before your procedure to ensure your preparation. Free valet parking service is available.   Special note: Every effort is made to have your procedure done on time. Please understand that emergencies sometimes delay scheduled procedures.  2. Diet: Do not eat or drink anything after midnight prior to your procedure except sips of water to take medications.  3. Labs: You will need to have preprocedure labwork done at [facility] no more than 2 weeks prior to your test. This does not  need to be fasting. There is a lab on the 1st floor of our building.  4. Medication instructions in preparation for your procedure:  On the morning of your procedure, take your Effient/Prasugrel and any morning medicines.  You may use sips of water.  5. Plan for one night stay--bring personal belongings. 6. Bring a current list of your medications and current insurance cards. 7. You MUST have a responsible person to drive you home. 8. Someone MUST be with you the first 24 hours after you arrive home or your discharge will be delayed. 9. Please wear clothes that are easy to get on and off and wear slip-on shoes.  Thank you for allowing [place] to care for you!   -- [facility]

## 2016-05-13 NOTE — Assessment & Plan Note (Signed)
Know if his dizziness and lightheadedness, I am concerned with his hypotension as opposed to hypertension. We have gradually pulled off medications. This time the plan will be to cut losartan altogether. I would like to add antianginal amlodipine until we can do his cardiac catheterization. DC losartan, add amlodipine 2.5 mg daily.

## 2016-05-13 NOTE — Assessment & Plan Note (Signed)
Stress test was read as low risk, however on my review I considered to be at least intermediate risk in a patient with symptoms. With his recurrent persistent symptoms that are very similar to his prior angina, I have to consider that this is a possible ischemic territory. Plan: Evaluate with cardiac catheterization.

## 2016-05-14 ENCOUNTER — Telehealth: Payer: Self-pay | Admitting: Cardiology

## 2016-05-14 LAB — PROTIME-INR
INR: 0.9
Prothrombin Time: 10 s (ref 9.0–11.5)

## 2016-05-14 LAB — APTT: aPTT: 30 s (ref 22–34)

## 2016-05-14 NOTE — Telephone Encounter (Signed)
Spoke with Dr. Ellyn Hack. Patient should take aspirin 81mg  every day. Patient notified.

## 2016-05-14 NOTE — Telephone Encounter (Signed)
Patient scheduled for cath next week.  Hospital called him to review  his medication. Patient has not been taking aspirin.[facility] advised he call here to see if he needs to resume daily aspirin.

## 2016-05-19 ENCOUNTER — Ambulatory Visit (HOSPITAL_COMMUNITY)
Admission: RE | Admit: 2016-05-19 | Discharge: 2016-05-19 | Disposition: A | Discharge status: Home / Self Care | Payer: PPO | Source: Ambulatory Visit | Attending: Cardiology | Admitting: Cardiology

## 2016-05-19 ENCOUNTER — Encounter (HOSPITAL_COMMUNITY): Payer: Self-pay | Admitting: Cardiology

## 2016-05-19 ENCOUNTER — Encounter (HOSPITAL_COMMUNITY)
Admission: RE | Disposition: A | Discharge status: Home / Self Care | Payer: Self-pay | Source: Ambulatory Visit | Attending: Cardiology

## 2016-05-19 DIAGNOSIS — I2510 Atherosclerotic heart disease of native coronary artery without angina pectoris: Secondary | ICD-10-CM | POA: Diagnosis not present

## 2016-05-19 DIAGNOSIS — I25110 Atherosclerotic heart disease of native coronary artery with unstable angina pectoris: Secondary | ICD-10-CM | POA: Insufficient documentation

## 2016-05-19 DIAGNOSIS — F17210 Nicotine dependence, cigarettes, uncomplicated: Secondary | ICD-10-CM | POA: Diagnosis not present

## 2016-05-19 DIAGNOSIS — J449 Chronic obstructive pulmonary disease, unspecified: Secondary | ICD-10-CM | POA: Insufficient documentation

## 2016-05-19 DIAGNOSIS — I252 Old myocardial infarction: Secondary | ICD-10-CM | POA: Insufficient documentation

## 2016-05-19 DIAGNOSIS — Z955 Presence of coronary angioplasty implant and graft: Secondary | ICD-10-CM | POA: Insufficient documentation

## 2016-05-19 DIAGNOSIS — E785 Hyperlipidemia, unspecified: Secondary | ICD-10-CM | POA: Insufficient documentation

## 2016-05-19 DIAGNOSIS — I255 Ischemic cardiomyopathy: Secondary | ICD-10-CM | POA: Insufficient documentation

## 2016-05-19 DIAGNOSIS — I25119 Atherosclerotic heart disease of native coronary artery with unspecified angina pectoris: Secondary | ICD-10-CM

## 2016-05-19 DIAGNOSIS — I208 Other forms of angina pectoris: Secondary | ICD-10-CM | POA: Diagnosis present

## 2016-05-19 DIAGNOSIS — I952 Hypotension due to drugs: Secondary | ICD-10-CM | POA: Insufficient documentation

## 2016-05-19 DIAGNOSIS — I5032 Chronic diastolic (congestive) heart failure: Secondary | ICD-10-CM | POA: Diagnosis present

## 2016-05-19 DIAGNOSIS — I110 Hypertensive heart disease with heart failure: Secondary | ICD-10-CM | POA: Insufficient documentation

## 2016-05-19 DIAGNOSIS — K219 Gastro-esophageal reflux disease without esophagitis: Secondary | ICD-10-CM | POA: Diagnosis not present

## 2016-05-19 DIAGNOSIS — Z8249 Family history of ischemic heart disease and other diseases of the circulatory system: Secondary | ICD-10-CM | POA: Diagnosis not present

## 2016-05-19 DIAGNOSIS — I209 Angina pectoris, unspecified: Secondary | ICD-10-CM

## 2016-05-19 DIAGNOSIS — G4733 Obstructive sleep apnea (adult) (pediatric): Secondary | ICD-10-CM | POA: Diagnosis not present

## 2016-05-19 DIAGNOSIS — Z7982 Long term (current) use of aspirin: Secondary | ICD-10-CM | POA: Insufficient documentation

## 2016-05-19 DIAGNOSIS — I5042 Chronic combined systolic (congestive) and diastolic (congestive) heart failure: Secondary | ICD-10-CM | POA: Diagnosis not present

## 2016-05-19 DIAGNOSIS — Z01810 Encounter for preprocedural cardiovascular examination: Secondary | ICD-10-CM

## 2016-05-19 DIAGNOSIS — R7303 Prediabetes: Secondary | ICD-10-CM | POA: Insufficient documentation

## 2016-05-19 DIAGNOSIS — R9439 Abnormal result of other cardiovascular function study: Secondary | ICD-10-CM | POA: Diagnosis present

## 2016-05-19 HISTORY — PX: CORONARY STENT INTERVENTION: CATH118234

## 2016-05-19 HISTORY — PX: LEFT HEART CATH AND CORONARY ANGIOGRAPHY: CATH118249

## 2016-05-19 LAB — POCT ACTIVATED CLOTTING TIME: Activated Clotting Time: 323 seconds

## 2016-05-19 SURGERY — LEFT HEART CATH AND CORONARY ANGIOGRAPHY
Anesthesia: LOCAL

## 2016-05-19 MED ORDER — VERAPAMIL HCL 2.5 MG/ML IV SOLN
INTRAVENOUS | Status: DC | PRN
  Administered 2016-05-19: 10 mL via INTRA_ARTERIAL

## 2016-05-19 MED ORDER — SODIUM CHLORIDE 0.9% FLUSH: 3.0000 mL | INTRAVENOUS | Status: DC | PRN

## 2016-05-19 MED ORDER — HEPARIN (PORCINE) IN NACL 2-0.9 UNIT/ML-% IJ SOLN
INTRAMUSCULAR | Status: DC | PRN
  Administered 2016-05-19: 1000 mL

## 2016-05-19 MED ORDER — MIDAZOLAM HCL 2 MG/2ML IJ SOLN
INTRAMUSCULAR | Status: DC | PRN
  Administered 2016-05-19: 2 mg via INTRAVENOUS

## 2016-05-19 MED ORDER — LIDOCAINE HCL (PF) 1 % IJ SOLN
INTRAMUSCULAR | Status: AC
  Filled 2016-05-19: qty 30

## 2016-05-19 MED ORDER — HEPARIN SODIUM (PORCINE) 1000 UNIT/ML IJ SOLN
INTRAMUSCULAR | Status: DC | PRN
  Administered 2016-05-19 (×2): 5000 [IU] via INTRAVENOUS

## 2016-05-19 MED ORDER — IOPAMIDOL (ISOVUE-370) INJECTION 76%
INTRAVENOUS | Status: DC | PRN
  Administered 2016-05-19: 165 mL via INTRA_ARTERIAL

## 2016-05-19 MED ORDER — HYDRALAZINE HCL 20 MG/ML IJ SOLN: 5.0000 mg | INTRAMUSCULAR | Status: AC | PRN

## 2016-05-19 MED ORDER — ACETAMINOPHEN 325 MG PO TABS
650.0000 mg | ORAL_TABLET | ORAL | Status: DC | PRN
  Administered 2016-05-19: 650 mg via ORAL

## 2016-05-19 MED ORDER — VERAPAMIL HCL 2.5 MG/ML IV SOLN
INTRAVENOUS | Status: AC
  Filled 2016-05-19: qty 2

## 2016-05-19 MED ORDER — PRASUGREL HCL 10 MG PO TABS
ORAL_TABLET | ORAL | Status: DC | PRN
  Administered 2016-05-19: 10 mg via ORAL

## 2016-05-19 MED ORDER — SODIUM CHLORIDE 0.9 % IV SOLN
INTRAVENOUS | Status: DC
  Administered 2016-05-19: 07:00:00 via INTRAVENOUS

## 2016-05-19 MED ORDER — HEPARIN (PORCINE) IN NACL 2-0.9 UNIT/ML-% IJ SOLN
INTRAMUSCULAR | Status: AC
  Filled 2016-05-19: qty 1000

## 2016-05-19 MED ORDER — ASPIRIN 81 MG PO CHEW: 81.0000 mg | CHEWABLE_TABLET | Freq: Once | ORAL | Status: DC

## 2016-05-19 MED ORDER — PRASUGREL HCL 10 MG PO TABS
ORAL_TABLET | ORAL | Status: AC
  Filled 2016-05-19: qty 1

## 2016-05-19 MED ORDER — LIDOCAINE HCL (PF) 1 % IJ SOLN
INTRAMUSCULAR | Status: DC | PRN
  Administered 2016-05-19: 2 mL via INTRADERMAL

## 2016-05-19 MED ORDER — SODIUM CHLORIDE 0.9% FLUSH: 3.0000 mL | Freq: Two times a day (BID) | INTRAVENOUS | Status: DC

## 2016-05-19 MED ORDER — SODIUM CHLORIDE 0.9 % IV SOLN: 250.0000 mL | INTRAVENOUS | Status: DC | PRN

## 2016-05-19 MED ORDER — HEPARIN SODIUM (PORCINE) 1000 UNIT/ML IJ SOLN
INTRAMUSCULAR | Status: AC
  Filled 2016-05-19: qty 1

## 2016-05-19 MED ORDER — FENTANYL CITRATE (PF) 100 MCG/2ML IJ SOLN
INTRAMUSCULAR | Status: DC | PRN
  Administered 2016-05-19: 25 ug via INTRAVENOUS

## 2016-05-19 MED ORDER — MIDAZOLAM HCL 2 MG/2ML IJ SOLN
INTRAMUSCULAR | Status: AC
  Filled 2016-05-19: qty 2

## 2016-05-19 MED ORDER — ASPIRIN 81 MG PO CHEW
CHEWABLE_TABLET | ORAL | Status: AC
  Administered 2016-05-19: 81 mg
  Filled 2016-05-19: qty 1

## 2016-05-19 MED ORDER — ONDANSETRON HCL 4 MG/2ML IJ SOLN: 4.0000 mg | Freq: Four times a day (QID) | INTRAMUSCULAR | Status: DC | PRN

## 2016-05-19 MED ORDER — IOPAMIDOL (ISOVUE-370) INJECTION 76%
INTRAVENOUS | Status: AC
Start: 2016-05-19 — End: 2016-05-19
  Filled 2016-05-19: qty 100

## 2016-05-19 MED ORDER — FENTANYL CITRATE (PF) 100 MCG/2ML IJ SOLN
INTRAMUSCULAR | Status: AC
  Filled 2016-05-19: qty 2

## 2016-05-19 MED ORDER — SODIUM CHLORIDE 0.9 % IV SOLN: INTRAVENOUS | Status: AC

## 2016-05-19 MED ORDER — ACETAMINOPHEN 325 MG PO TABS
ORAL_TABLET | ORAL | Status: AC
  Administered 2016-05-19: 650 mg via ORAL
  Filled 2016-05-19: qty 2

## 2016-05-19 MED ORDER — ANGIOPLASTY BOOK
Freq: Once | Status: DC
  Filled 2016-05-19 (×2): qty 1

## 2016-05-19 SURGICAL SUPPLY — 19 items
BALLN EUPHORA RX 2.0X12 (BALLOONS) ×2
BALLN ~~LOC~~ EUPHORA RX 2.5X15 (BALLOONS) ×2
BALLOON EUPHORA RX 2.0X12 (BALLOONS) ×1 IMPLANT
BALLOON ~~LOC~~ EUPHORA RX 2.5X15 (BALLOONS) ×1 IMPLANT
CATH INFINITI 5FR ANG PIGTAIL (CATHETERS) ×2 IMPLANT
CATH LAUNCHER 6FR 3DRIGHT (CATHETERS) ×1 IMPLANT
CATH OPTITORQUE TIG 4.0 5F (CATHETERS) ×2 IMPLANT
CATHETER LAUNCHER 6FR 3DRIGHT (CATHETERS) ×2
DEVICE RAD COMP TR BAND LRG (VASCULAR PRODUCTS) ×2 IMPLANT
GLIDESHEATH SLEND SS 6F .021 (SHEATH) ×2 IMPLANT
GUIDEWIRE INQWIRE 1.5J.035X260 (WIRE) ×1 IMPLANT
INQWIRE 1.5J .035X260CM (WIRE) ×2
KIT ENCORE 26 ADVANTAGE (KITS) ×2 IMPLANT
KIT HEART LEFT (KITS) ×2 IMPLANT
PACK CARDIAC CATHETERIZATION (CUSTOM PROCEDURE TRAY) ×2 IMPLANT
STENT SYNERGY DES 2.5X16 (Permanent Stent) ×2 IMPLANT
TRANSDUCER W/STOPCOCK (MISCELLANEOUS) ×2 IMPLANT
TUBING CIL FLEX 10 FLL-RA (TUBING) ×2 IMPLANT
WIRE MARVEL STR TIP 190CM (WIRE) ×2 IMPLANT

## 2016-05-19 NOTE — Progress Notes (Addendum)
Discussed stent, Effient, restrictions, diet, ex, smoking cessation, NTG, and CRPII. Voiced understanding and requested his referral be sent to [facility]. Will send. Pt is committed to quitting smoking and we discussed this in depth. Gave fake cigarette and resources. [place], ACSM 2:32 PM 05/19/2016

## 2016-05-19 NOTE — H&P (View-Only) (Signed)
PCP: Webb Silversmith, NP  Clinic Note: Chief Complaint  Patient presents with  . Follow-up    f/u NUC results , pt c/o dizziness  . Coronary Artery Disease    Exertional chest tightness and shortness of breath    HPI: Troy Parrish is a 56 y.o. male with a PMH below who presents today for 1 month month f/u for significant MV CAD - s/p at least 3 STEMIs. -- He is here to discuss results of his Myoview stress test  Troy Parrish was last seen on 04/16/2016: Although remaining happy, he was started to note some exercise intolerance with worsening dyspnea. May be some chest tightness associated with exertion once or twice. He has taken nitroglycerin. -> Was evaluated with Myoview stress test: Prior to that in October 2017, he was doing relatively well. He was happy, was doing activities with his family including traveling. He did note exertional dyspnea but only if he over did it. Relatively well-controlled anginal symptoms. One issue with him is been up titration of his beta blocker dose.  Recent Hospitalizations:  none  Studies Reviewed:    Myoview 04/25/2016: Exercised for 9 minutes. Reached 90% max. Heart rate. 9.4 METS. Read as a low risk study with normal EF 55-65%. -> On my review, there does appear to be a small area of concern in the inferior wall. Based on that I wanted to reassess his symptoms. If symptoms persist, we would probably want to consider relook catheterization.  Interval History: Troy Parrish presents today Stating that he feels a little better and he did last time I saw him, but is still noticing intermittent episodes of chest tightness along with his shortness of breath with moderate amount of activity walking around the house carrying out the trash. He is more cognizant of it now because it is been going on for a while. He states that these are the symptoms that he started having leading up to the last time we did a cath with multiple vessel stenting. He is having the shortness of  breath and the tightness across his chest more frequently and it lasts a little bit longer. He is using some nitroglycerin. He's been reluctant to try Imdur again because of bad headache. He also did not do well with Ranexa in the past. He is still having some dizziness spells and is noticing it today as well. Less associated with a little bit of fatigue. He is not having any PND or orthopnea. No edema. No rapid irregular heartbeats or palpitations. Although dizzy, no sig. Near syncope. No TIA or amaurosis fugax symptoms.   He wants to join a gym with his family and do routine exercise, but is scared with is now having more exertional dyspnea and chest tightness. No claudication.   He still indicates that hehas been unable to fully quit smoking. Every time quits goes right back to smoking them. Not much, still is smoking occasionally.  ROS: A comprehensive was performed. Review of Systems  Constitutional: Positive for malaise/fatigue (Less energy than usual).  HENT: Negative for nosebleeds.   Respiratory: Positive for shortness of breath. Negative for cough and wheezing.   Gastrointestinal: Negative for blood in stool and melena.  Genitourinary: Negative for hematuria.  Musculoskeletal: Negative for falls.  Skin: Negative.   Neurological: Positive for dizziness (He is noting vertigo symptoms). Negative for focal weakness.  Psychiatric/Behavioral: Negative for depression (Seems like more dysthymia) and memory loss. The patient has insomnia (Not really sleeping well.). The patient is not  nervous/anxious.   All other systems reviewed and are negative.   Past Medical History:  Diagnosis Date  . CAD S/P percutaneous coronary angioplasty 07/07/2011; 6 & 10 2015   S/P PCI to all 3 major vessels;a)  Ant STEMI 2001- BMS-LAD x2 -->(redo PCI 6/'15 -- pLAD Xience DES 2.5 x 12- 2.75 mm, mLAD 2.25 x 12 - 2.7 mm), b) '06 UA --> Cx- OM DES; c) 2013 Inf MI BMS mRCA --> d) 10/'15 PCI dRCA Promus P DES 4.0 x  16 (4.25 mm); PTCA of RPL2 (2.0 mm) &RPDA (2.25 mm) 11/2013  . Chronic bronchitis ([place]) "used to get it q yr"  . COPD (chronic obstructive pulmonary disease) ([place])   . Elevated WBC count   . Emphysema of lung ([place])   . Essential hypertension 07/07/2011  . Former heavy tobacco smoker     quit in May 2015 after multiple attempts at trying to quit before   . GERD (gastroesophageal reflux disease)   . Glucose intolerance (pre-diabetes) June 2015    hemoglobin A1c 6.6  . Headache    "Imdur related; stopped taking it; headaches went away" (11/15/2013)  . Heart murmur   . History of colon polyps   . History of stomach ulcers   . Hyperlipidemia with target LDL less than 70 07/07/2011  . Metabolic syndrome    Pre-diabetes, hypertension and truncal obesity as well as dyslipidemia  . OSA on CPAP   . Pneumonia "several times"  . Recurrent upper respiratory infection (URI)   . Seizures ([place]) "several"   "last one was ~ 2011" (11/15/2013)  . Shortness of breath dyspnea   . ST elevation myocardial infarction (STEMI) of anterior wall ([place]) 2001   History of -- 2 stents in early and distal mid LAD; prior cardiologist was Dr. Remi Haggard in [place]  . ST elevation myocardial infarction (STEMI) of inferior wall ([place]) 07/07/2011   Occluded RCA 5.53Z48 INTEGRITY; Echo 07/2013: EF 45-50%, Inferior & Posterolateral HK.   Marland Kitchen Umbilical hernia   . Unstable angina ([place])  2006; June 2015   Cx-OM - PCI 2.5 mm 13 mm Cypher DES [name], [place]) ; 07/2013: Severe ISR of both prox & Distal LAD stentS --> 2 DES stents (1 at prox edge of the proximal stent, 2nd covers the entire distal stent as well as proximal and distal edge stenose)   . Upper respiratory infection     Past Surgical History:  Procedure Laterality Date  . Cardiac MRI [facility]  09/2014   EF 51%. Mod HK of basal-mid Inf wall, mild HK of basal inferoseptum & basal-mid inferolateral wall with hyper enhancement (c/w subendocard RCA MI with significant  viability), focal hyper enhancement of apical wall c/w dLAD subendocard MI & complete LAD viability. . No aortic stenosis. Normal RV function. No Ischemia on Adenosine Stress.  . COLONOSCOPY WITH PROPOFOL N/A 03/28/2015   Procedure: COLONOSCOPY WITH PROPOFOL;  Surgeon: Lucilla Lame, MD;  Location: [facility] [place];  Service: Endoscopy;  Laterality: N/A;  . CORONARY ANGIOPLASTY WITH STENT PLACEMENT  2001   ANTERIOR mi with a  PCI to LAD; [place], [place] - Dr. Dorris Fetch  . CORONARY ANGIOPLASTY WITH STENT PLACEMENT  2006   [place] by Dr Dorris Fetch -lesion lft circ/OM1 with 2.5 x 70m Cypher DES  . ESOPHAGOGASTRODUODENOSCOPY (EGD) WITH PROPOFOL N/A 03/28/2015   Procedure: ESOPHAGOGASTRODUODENOSCOPY (EGD) WITH PROPOFOL;  Surgeon: DLucilla Lame MD;  [place]  Service: Endoscopy;  Laterality: N/A;  . INSERTION OF MESH N/A 04/02/2015  Procedure: INSERTION OF MESH;  Surgeon: Florene Glen, MD;  Location: [facility];  Service: General;  Laterality: N/A;  . LEFT AND RIGHT HEART CATHETERIZATION WITH CORONARY ANGIOGRAM N/A 07/30/2013   Procedure: LEFT AND RIGHT HEART CATHETERIZATION WITH CORONARY ANGIOGRAM;  Surgeon: Leonie Man, MD;  Location: [facility] CATH LAB;  Service: Cardiovascular;  2 separate P & mLAD lesions -> PCI, MOd-Severe dRCA disesase with diffuse RPL/PDA disease (FFR)  . LEFT HEART CATH  08/03/2013   Procedure: LEFT HEART CATH;  Surgeon: Leonie Man, MD;  Location: [facility] CATH LAB;  Service: Cardiovascular;;FFR of RCA - non-flow-limiting  . LEFT HEART CATHETERIZATION WITH CORONARY ANGIOGRAM N/A 07/07/2011   Procedure: LEFT HEART CATHETERIZATION WITH CORONARY ANGIOGRAM;  Surgeon: Leonie Man, MD;  Location: [facility] CATH LAB;  Service: Cardiovascular;  inferolat post LV infarct ST-elevation MI  . MET/CPET  11/09/2011   submax. effort 1.05 RER peak V02 was 51% ,chronotropic incomp.hrt rate lows 80s to 100  . MET/CPET  September 2016   [facility]: Mild functional impairment due primarily to  mild pulmonary and circulatory limitations. Also suggest physical deconditioning (seen with low-normal aerobic reserve). Mild VQ mismatch with exercise.  Ventilatory reserve exhausted with peak exercise demonstrated pulmonary limitation. No significant decrease in postexercise FEV1 compared to rest --> suggest no exercise induced bronchospasm  . NM MYOVIEW ([place] [facility])  11/30/2011   EF 45% ,exercise 10 METS, infarct\scar w mild perinfarct ischemia -basal inferolat and mid inferolat region  . NM MYOVIEW LTD  04/25/2016   Exercised for 9 minutes. Reached 90% max. Heart rate. 9.4 METS. Read as a low risk study with normal EF 55-65%. -> on Review -  there does appear to be a small to moderate sized, medium severtiy reversible perfusion defect in the anterior wall - read by computer, but not noted by reader.  Marland Kitchen PERCUTANEOUS CORONARY STENT INTERVENTION (PCI-S)  07/07/2011   Procedure: PERCUTANEOUS CORONARY STENT INTERVENTION (PCI-S);  Surgeon: Leonie Man, MD;  Location: [facility] CATH LAB;  Service: Cardiovascular;;occluded RCA ,Integrity Resolute DES 2.75 x 18 mm stent - post-dilated 3.1 mm  . PERCUTANEOUS CORONARY STENT INTERVENTION (PCI-S)  07/30/2013   Procedure: PERCUTANEOUS CORONARY STENT INTERVENTION (PCI-S);  Surgeon: Leonie Man, MD;  Location: [facility] CATH LAB;  Service: Cardiovascular;;Distal mid LAD: Xience Alpine DES 2.25 mm x 28 mm (overlapping proximal and distal edge of previous stent) - 2.7 mm; proximal LAD 2.5 mm x 12 mm Xience Alpine DES (2.8 mm);; RCA 60-70% stenosis planned staged procedure  . PERCUTANEOUS CORONARY STENT INTERVENTION (PCI-S) N/A 11/15/2013   Procedure: PERCUTANEOUS CORONARY STENT INTERVENTION (PCI-S);  Surgeon: Leonie Man, MD;  Location: [facility] CATH LAB;  Service: Cardiovascular;  Patent Cx & LAD Stents; PCI -dRCA Promus Premier DES 4.0 mm x 16 mm (4.25 mm) dRCA, 2.0 mm Cutting PTCA of RPL2 [place] & POBA of mRPDA  . POLYPECTOMY  03/28/2015   Procedure: POLYPECTOMY;  Surgeon: Lucilla Lame, MD;  Location: [place];  Service: Endoscopy;;  . RIGHT HEART CATH  June 2015   Normal pressures with severely reduced cardiac output and index. (3.25/1.55)  . TRANSTHORACIC ECHOCARDIOGRAM  07/31/2013; February 2017   a. LVEF 45-50. Inferior posterior hypokinesis with mild dilation. Apparent normal diastolic pressures;   . UMBILICAL HERNIA REPAIR N/A 04/02/2015   Procedure: HERNIA REPAIR UMBILICAL ADULT;  Surgeon: Florene Glen, MD;  Location: [facility] ORS;  Service: General;  Laterality: N/A;    Current Meds  Medication Sig  . albuterol (PROVENTIL  HFA) 108 (90 Base) MCG/ACT inhaler Inhale 1 puff into the lungs every 6 (six) hours as needed. (Patient taking differently: Inhale 1 puff into the lungs every 6 (six) hours as needed for wheezing or shortness of breath. )  . aspirin 81 MG EC tablet Take 1 tablet (81 mg total) by mouth at bedtime.  Marland Kitchen EFFIENT 10 MG TABS tablet TAKE 1 TABLET (10 MG TOTAL) BY MOUTH DAILY. (Patient taking differently: Take 10 mg by mouth every evening. )  . metoprolol succinate (TOPROL-XL) 25 MG 24 hr tablet Take 1 tablet (25 mg total) by mouth daily. Take with or immediately following a meal. (Patient taking differently: Take 25 mg by mouth every evening. Take with or immediately following a meal.)  . nitroGLYCERIN (NITROSTAT) 0.4 MG SL tablet Place 1 tablet (0.4 mg total) under the tongue every 5 (five) minutes as needed for chest pain.  Marland Kitchen omeprazole (PRILOSEC) 20 MG capsule Take 1 capsule (20 mg total) by mouth daily. (Patient taking differently: Take 20 mg by mouth every evening. )  . rosuvastatin (CRESTOR) 20 MG tablet Take 1 tablet (20 mg total) by mouth daily. (Patient taking differently: Take 20 mg by mouth every evening. )  . [DISCONTINUED] losartan (COZAAR) 25 MG tablet Take 1 tablet (25 mg total) by mouth daily.    Allergies  Allergen Reactions  . Niacin And Related Hives and Itching    Social History   Social History  . Marital status:  Married    Spouse name: N/A  . Number of children: N/A  . Years of education: N/A   Social History Main Topics  . Smoking status: Current Every Day Smoker    Packs/day: 0.50    Years: 40.00    Types: Cigarettes    Last attempt to quit: 06/19/2013  . Smokeless tobacco: Never Used     Comment: Quit with Bupropion 176m - > unfortunately, he restarted  . Alcohol use 0.6 oz/week    1 Cans of beer per week     Comment: rare  . Drug use: No  . Sexual activity: Yes   Other Topics Concern  . None   Social History Narrative   He is a married father of 859 grandfather of 858 He does not really get routine exercise.    He is down to 5 or 6 cigarettes a day. He is very seriously wanting to quit. This is a significant cutback for him, but he has pretty much been told he needs to stop by his wife, and so he fully intends to do so. He is willing to try the patches or whatever. He has a social alcoholic beverage every now and then.     family history includes Cancer in his maternal aunt; Coronary artery disease in his father; Diabetes in his maternal grandfather, maternal grandmother, and mother; Emphysema in his maternal grandfather; Heart disease in his father, paternal grandfather, and paternal grandmother; Hyperlipidemia in his maternal grandfather, maternal grandmother, and mother; Hypertension in his father; Stroke in his father.  Wt Readings from Last 3 Encounters:  05/13/16 186 lb 3.2 oz (84.5 kg)  04/27/16 183 lb (83 kg)  04/16/16 183 lb 12.8 oz (83.4 kg)    PHYSICAL EXAM BP 100/80   Pulse 80   Ht 6' (1.829 m)   Wt 186 lb 3.2 oz (84.5 kg)   BMI 25.25 kg/m  General appearance: alert, cooperative, appears stated age, he no longer seems depressed. He actually has more color than usual today  and in somewhat better spirits. Well-nourished and well-groomed  HEENT: [place]/AT, EOMI, MMM, anicteric sclera Neck: no adenopathy, no carotid bruit, no JVD and supple, symmetrical, trachea midline   Lungs: diminished breath sounds bilaterally, wheezes bilaterally and Otherwise the most part CPAP. Nonlabored. Increased AP diameter. Prolonged expiratory phase  Heart: RRR with ectopy, S1, S2 normal, no murmur, click, rub or gallop and normal apical impulse  Abdomen: soft, non-tender; bowel sounds normal; no masses, no organomegaly and Mildly protuberant  Extremities: extremities normal, atraumatic, no cyanosis or edema  Pulses: 2+ and symmetric  Neurologic: Mental status: Alert, oriented, thought content appropriate, affect: blunted and mood-congruent    Adult ECG Report n/a   Other studies Reviewed: Additional studies/ records that were reviewed today include:  Recent Labs:   Lab Results  Component Value Date   CHOL 188 03/30/2016   HDL 38.40 (L) 03/30/2016   LDLCALC 114 (H) 03/30/2016   LDLDIRECT 109.0 09/03/2015   TRIG 177.0 (H) 03/30/2016   CHOLHDL 5 03/30/2016   Lab Results  Component Value Date   CREATININE 0.77 03/30/2016   BUN 11 03/30/2016   NA 138 03/30/2016   K 4.3 03/30/2016   CL 109 03/30/2016   CO2 25 03/30/2016    ASSESSMENT / PLAN: Problem List Items Addressed This Visit    Abnormal nuclear stress test    Stress test was read as low risk, however on my review I considered to be at least intermediate risk in a patient with symptoms. With his recurrent persistent symptoms that are very similar to his prior angina, I have to consider that this is a possible ischemic territory. Plan: Evaluate with cardiac catheterization.      Relevant Orders   Basic Metabolic Panel (BMET)   PTT   INR/PT   CBC   LEFT HEART CATHETERIZATION WITH CORONARY ANGIOGRAM   Angina, class II ([place]) - Primary    Up until the last couple months, the symptoms been very well controlled. Now he is having more concerning symptoms of exertional chest tightness and dyspnea. He is now reluctant to do things that he was doing. As a result he is starting to become more depressed and  anxious. The stress test was read as low risk, however I clearly see on the images that there is about possible reversible inferior defect and knowing that I did angioplasty on both the PL system and the PDA system as well as 2 places in the RCA, I think we are warranted to proceed with cardiac catheterization. He is desperate to find out was going on.  Plan after much discussion, we decided to proceed with cardiac catheterization, plus/minus PCI.  He is well aware of the risks, benefits alternatives and indications of the procedure.   What I would like to do for his anginal symptoms is to stop losartan, given the couple days to let us dizziness resolved and then*I starting low-dose amlodipine 2.5 mg daily at bedtime.We reviewed them in detail and he agrees to proceed.  We will schedule him for next Wednesday via right radial approach.      Relevant Medications   amLODipine (NORVASC) 2.5 MG tablet   Other Relevant Orders   Basic Metabolic Panel (BMET)   PTT   INR/PT   CBC   LEFT HEART CATHETERIZATION WITH CORONARY ANGIOGRAM   CAD S/P percutaneous coronary angioplasty (Chronic)    He is status post multiple different PCI's including all 3 major vascular beds. Several different stents in the LAD and then  most recently the RCA distally with PTCA of both the posterior lateral system and the PDA. With recurrent anginal symptoms, plan is to proceed to cardiac catheterization. He remains on Effient plus aspirin, beta blocker and losartan. He is on Crestor. He has been intolerant of Imdur and Ranexa in the past.       Relevant Medications   amLODipine (NORVASC) 2.5 MG tablet   Other Relevant Orders   LEFT HEART CATHETERIZATION WITH CORONARY ANGIOGRAM   Cardiomyopathy, ischemic; EF~45% with hypokinesis of the inferior and inferolateral myocardium; CO 3.25 (Chronic)    No real heart failure symptoms. Stable EF. He is having some class II symptoms of dyspnea and angina which were downgoing  evaluate with cardiac catheterization. Because of his dizziness I am stopping losartan in order to allow using amlodipine for antianginal effect. Otherwise continue current dose of Toprol. Euvolemic with no diuretic requirement      Relevant Medications   amLODipine (NORVASC) 2.5 MG tablet   Other Relevant Orders   LEFT HEART CATHETERIZATION WITH CORONARY ANGIOGRAM   Chronic combined systolic and diastolic congestive heart failure, NYHA class 2 (HCC) (Chronic)    Euvolemic. No diuretic requirement. He is on stable dose of Toprol. Replacing ARB with calcium channel blocker for antianginal effect.      Relevant Medications   amLODipine (NORVASC) 2.5 MG tablet   Hyperlipidemia with target LDL less than 70; by his report he is back on Crestor (Chronic)    Back on Crestor now. Depending what we see in the Cath Lab, we may need to try to increase to 40 mg and potentially add Zetia. Otherwise, would consider referral for considering PCSK9 inhibitor.      Relevant Medications   amLODipine (NORVASC) 2.5 MG tablet   Hypotension due to drugs    Know if his dizziness and lightheadedness, I am concerned with his hypotension as opposed to hypertension. We have gradually pulled off medications. This time the plan will be to cut losartan altogether. I would like to add antianginal amlodipine until we can do his cardiac catheterization. DC losartan, add amlodipine 2.5 mg daily.      Relevant Medications   amLODipine (NORVASC) 2.5 MG tablet    Other Visit Diagnoses    Preprocedural cardiovascular examination       Relevant Orders   Basic Metabolic Panel (BMET)   PTT   INR/PT   CBC   Clotting disorder (HCC)       Relevant Orders   PTT   INR/PT      Current medicines are reviewed at length with the patient today. (+/- concerns)  None The following changes have been made:  None for now. May need to add Imdur  Patient Instructions      Medication Instructions: STOP losartan today. In 3  days, start taking amlodipine 2.78m daily in the evening.   Labwork: 1 week prior to cath   Testing/Procedures:  Your physician has requested that you have a cardiac catheterization. Cardiac catheterization is used to diagnose and/or treat various heart conditions. Doctors may recommend this procedure for a number of different reasons. The most common reason is to evaluate chest pain. Chest pain can be a symptom of coronary artery disease (CAD), and cardiac catheterization can show whether plaque is narrowing or blocking your heart's arteries. This procedure is also used to evaluate the valves, as well as measure the blood flow and oxygen levels in different parts of your heart. For further information please visit [email or web address] Please  follow instruction sheet, as given.  RIGHT RADIAL - SCHEDULE WITH DR. Tharun Cappella   Follow-Up: 2 weeks after cath    If you need a refill on your cardiac medications before your next appointment, please call your pharmacy.    [facility] [address] [place] [address] Dept: [phone] Loc: [place]  05/13/2016  You are scheduled for a Cardiac Catheterization         1. Please arrive at the [facility] (Main Entrance A) at [facility]: [address] [place], [place] [address] two hours before your procedure to ensure your preparation. Free valet parking service is available.   Special note: Every effort is made to have your procedure done on time. Please understand that emergencies sometimes delay scheduled procedures.  2. Diet: Do not eat or drink anything after midnight prior to your procedure except sips of water to take medications.  3. Labs: You will need to have preprocedure labwork done at [facility] no more than 2 weeks prior to your test. This does not need to be fasting. There is a lab on the 1st floor of our  building.  4. Medication instructions in preparation for your procedure:  On the morning of your procedure, take your Effient/Prasugrel and any morning medicines.  You may use sips of water.  5. Plan for one night stay--bring personal belongings. 6. Bring a current list of your medications and current insurance cards. 7. You MUST have a responsible person to drive you home. 8. Someone MUST be with you the first 24 hours after you arrive home or your discharge will be delayed. 9. Please wear clothes that are easy to get on and off and wear slip-on shoes.  Thank you for allowing [place] to care for you!   -- [facility]    Studies Ordered:   Orders Placed This Encounter  Procedures  . Basic Metabolic Panel (BMET)  . PTT  . INR/PT  . CBC  . LEFT HEART CATHETERIZATION WITH CORONARY Illene Silver, M.D., M.S. Interventional Cardiologist   Pager # [phone] Phone # [phone] [address] [place], [place] [phone]

## 2016-05-19 NOTE — Discharge Instructions (Signed)
Radial Site Care °Refer to this sheet in the next few weeks. These instructions provide you with information about caring for yourself after your procedure. Your health care provider may also give you more specific instructions. Your treatment has been planned according to current medical practices, but problems sometimes occur. Call your health care provider if you have any problems or questions after your procedure. °What can I expect after the procedure? °After your procedure, it is typical to have the following: °· Bruising at the radial site that usually fades within 1-2 weeks. °· Blood collecting in the tissue (hematoma) that may be painful to the touch. It should usually decrease in size and tenderness within 1-2 weeks. °Follow these instructions at home: °· Take medicines only as directed by your health care provider. °· You may shower 24-48 hours after the procedure or as directed by your health care provider. Remove the bandage (dressing) and gently wash the site with plain soap and water. Pat the area dry with a clean towel. Do not rub the site, because this may cause bleeding. °· Do not take baths, swim, or use a hot tub until your health care provider approves. °· Check your insertion site every day for redness, swelling, or drainage. °· Do not apply powder or lotion to the site. °· Do not flex or bend the affected arm for 24 hours or as directed by your health care provider. °· Do not push or pull heavy objects with the affected arm for 24 hours or as directed by your health care provider. °· Do not lift over 10 lb (4.5 kg) for 5 days after your procedure or as directed by your health care provider. °· Ask your health care provider when it is okay to: °¨ Return to work or school. °¨ Resume usual physical activities or sports. °¨ Resume sexual activity. °· Do not drive home if you are discharged the same day as the procedure. Have someone else drive you. °· You may drive 24 hours after the procedure  unless otherwise instructed by your health care provider. °· Do not operate machinery or power tools for 24 hours after the procedure. °· If your procedure was done as an outpatient procedure, which means that you went home the same day as your procedure, a responsible adult should be with you for the first 24 hours after you arrive home. °· Keep all follow-up visits as directed by your health care provider. This is important. °Contact a health care provider if: °· You have a fever. °· You have chills. °· You have increased bleeding from the radial site. Hold pressure on the site. °Get help right away if: °· You have unusual pain at the radial site. °· You have redness, warmth, or swelling at the radial site. °· You have drainage (other than a small amount of blood on the dressing) from the radial site. °· The radial site is bleeding, and the bleeding does not stop after 30 minutes of holding steady pressure on the site. °· Your arm or hand becomes pale, cool, tingly, or numb. °This information is not intended to replace advice given to you by your health care provider. Make sure you discuss any questions you have with your health care provider. °Document Released: 02/27/2010 Document Revised: 07/03/2015 Document Reviewed: 08/13/2013 °Elsevier Interactive Patient Education © 2017 [employer] ° °

## 2016-05-19 NOTE — Interval H&P Note (Signed)
History and Physical Interval Note:  05/19/2016 8:22 AM  Troy Parrish  has presented today for surgery, with the diagnosis of abnormal stress test  The various methods of treatment have been discussed with the patient and family. After consideration of risks, benefits and other options for treatment, the patient has consented to  Procedure(s): Left Heart Cath and Coronary Angiography (N/A) with possible percutaneous coronary intervention as a surgical intervention .  The patient's history has been reviewed, patient examined, no change in status, stable for surgery.  I have reviewed the patient's chart and labs.  Questions were answered to the patient's satisfaction.    Cath Lab Visit (complete for each Cath Lab visit)  Clinical Evaluation Leading to the Procedure:   ACS: No.  Non-ACS:    Anginal Classification: CCS II  Anti-ischemic medical therapy: Maximal Therapy (2 or more classes of medications)  Non-Invasive Test Results: Equivocal test results - read as Low Risk - but on relook, appears at least Intermediate Risk with continued Sx.  Prior CABG: No previous CABG   Glenetta Hew

## 2016-05-19 NOTE — Discharge Summary (Signed)
Discharge Summary    Patient ID: Troy Parrish,  MRN: [id number], DOB/AGE: [birth year] 56 y.o.  Admit date: 05/19/2016 Discharge date: 05/19/2016  Primary Care Provider: Webb Silversmith Primary Cardiologist: Ellyn Hack  Discharge Diagnoses    Principal Problem:   Angina, class II ([place]) Active Problems:   Cardiomyopathy, ischemic; EF~45% with hypokinesis of the inferior and inferolateral myocardium; CO 3.25   CAD S/P percutaneous coronary angioplasty   Chronic combined systolic and diastolic congestive heart failure, NYHA class 2 ([facility])   Abnormal nuclear stress test   Allergies Allergies  Allergen Reactions  . Niacin And Related Hives and Itching    Diagnostic Studies/Procedures    LHC: 05/19/16  Conclusion     1st Mrg-2 lesion, 85 %stenosed. Just proximal to previous stent (Cypher DES 2.5 mm x 13 mm)  A STENT SYNERGY DES 2.5X16 drug eluting stent was successfully placed, and overlaps previously placed stent.  Post intervention, there is a 0% residual stenosis.  ____Remaining Stents & PTCA Sites Patent______  Prox RCA Resolute DES 3.75 mm x 18 mm (4.0 mm), 0 %stenosed.  Dist RCA Promus DES 4.0 mm x 16 mm (4.25 mm), 0 %stenosed.  2nd RPLB lesion, 70 %stenosed. 2 small for PTCA  RPDA lesion, 30 %stenosed. Previous PTCA site  Post Atrio lesion, 10 %stenosed. Previous PTCA site  Prox LAD to Mid LAD BMS overlapped with Xience DES 2.73mm x 41mm (2.61mm), 0 %stenosed.  Dist LAD BMS covered with a Xience DES 2.25 mm x 28 mm (2.7 mm), 0 %stenosed.  Mid LAD to Dist LAD lesion, 45 %stenosed - in the intervening segment between the stented segments  There is mild left ventricular systolic dysfunction.  LV end diastolic pressure is normal.  The left ventricular ejection fraction is 45-50% by visual estimate.   Culprit lesion goes along with the inferolateral ischemia last saw on the stress test. 85% pre-stent stenosis in the OM1 treated with overlapping DES  stent.  Other stents and PTCA sites widely patent. Stable mildly reduced LVEF with normal LVEDP  Plan: Standard post radial band removal  Stable PCI. Candidate for same-day discharge  Continue home dose of aspirin and Effient  Continue home dose of Toprol, and if blood pressure will tolerate, can restart amlodipine 2.5 gram.   He will follow-up in clinic with either myself or APP next week.    _____________   History of Present Illness     56 yo male with PMH of CAD s/p multiple stents, COPD, hypertension, hyperlipidemia, OSA on CPAP who presented to the office on 05/14/26 seen with Dr. Ellyn Hack. At this visit he reported intermittent episodes of chest tightness with shortness of breath noted on exertion. He reported using intermittent nitroglycerin. Also reported dizzy spells, with fatigue. Reported continuing to smoke, but decreased overall use. Underwent a exercise Myoview on 04/25/16 that showed small area of concern of the anterior wall. Was given reports and recent Myoview plan was made to send for cardiac catheterization.  Hospital Course     Consultants: none  He presented on 05/19/16 and underwent cardiac catheterization with Dr. Ellyn Hack showing culprit lesion of 85% in-stent restenosis in the OM1 treated with overlapping drug-eluting stent, with previous stents and PTCA sites widely patent. Stable but mildly reduce LVEF with normal LVEDP. He was continued on his home dose of aspirin and Effient, along with Toprol. He was given a prescription prior to cath for amlodipine 2.5 mg and instructed to monitor blood pressure with starting this medication. Seen  by cardiac rehabilitation over and short stay and provided with education. Right radial cath site was stable without hematoma/bruisin prior to discharge. Questions were answered, and follow-up was arranged. Provided a work note prior to discharge.  _____________  Discharge Vitals Blood pressure (!) 157/98, pulse 83, temperature  97.6 F (36.4 C), temperature source Oral, resp. rate 18, height 6' (1.829 m), weight 184 lb (83.5 kg), SpO2 97 %.  Filed Weights   05/19/16 0652  Weight: 184 lb (83.5 kg)    Labs & Radiologic Studies    CBC No results for input(s): WBC, NEUTROABS, HGB, HCT, MCV, PLT in the last 72 hours. Basic Metabolic Panel No results for input(s): NA, K, CL, CO2, GLUCOSE, BUN, CREATININE, CALCIUM, MG, PHOS in the last 72 hours. Liver Function Tests No results for input(s): AST, ALT, ALKPHOS, BILITOT, PROT, ALBUMIN in the last 72 hours. No results for input(s): LIPASE, AMYLASE in the last 72 hours. Cardiac Enzymes No results for input(s): CKTOTAL, CKMB, CKMBINDEX, TROPONINI in the last 72 hours. BNP Invalid input(s): POCBNP D-Dimer No results for input(s): DDIMER in the last 72 hours. Hemoglobin A1C No results for input(s): HGBA1C in the last 72 hours. Fasting Lipid Panel No results for input(s): CHOL, HDL, LDLCALC, TRIG, CHOLHDL, LDLDIRECT in the last 72 hours. Thyroid Function Tests No results for input(s): TSH, T4TOTAL, T3FREE, THYROIDAB in the last 72 hours.  Invalid input(s): FREET3 _____________  No results found. Disposition   Pt is being discharged home today in good condition.  Follow-up Plans & Appointments    Follow-up Information    [facility] Def[facility], [place] Follow up on 05/26/2016.   Specialties:  Cardiology, Radiology Why:  at 8:30am for your follow up appt. Contact information: [address] [place] [address] [phone]          Discharge Instructions    Amb Referral to Cardiac Rehabilitation    Complete by:  As directed    Diagnosis:   Coronary Stents PTCA     Call MD for:  redness, tenderness, or signs of infection (pain, swelling, redness, odor or green/yellow discharge around incision site)    Complete by:  As directed    Diet - low sodium heart healthy    Complete by:  As directed    Discharge instructions    Complete by:  As directed     Radial Site Care Refer to this sheet in the next few weeks. These instructions provide you with information on caring for yourself after your procedure. Your caregiver may also give you more specific instructions. Your treatment has been planned according to current medical practices, but problems sometimes occur. Call your caregiver if you have any problems or questions after your procedure. HOME CARE INSTRUCTIONS You may shower the day after the procedure.Remove the bandage (dressing) and gently wash the site with plain soap and water.Gently pat the site dry.  Do not apply powder or lotion to the site.  Do not submerge the affected site in water for 3 to 5 days.  Inspect the site at least twice daily.  Do not flex or bend the affected arm for 24 hours.  No lifting over 5 pounds (2.3 kg) for 5 days after your procedure.  Do not drive home if you are discharged the same day of the procedure. Have someone else drive you.  You may drive 24 hours after the procedure unless otherwise instructed by your caregiver.  What to expect: Any bruising will usually fade within 1 to  2 weeks.  Blood that collects in the tissue (hematoma) may be painful to the touch. It should usually decrease in size and tenderness within 1 to 2 weeks.  SEEK IMMEDIATE MEDICAL CARE IF: You have unusual pain at the radial site.  You have redness, warmth, swelling, or pain at the radial site.  You have drainage (other than a small amount of blood on the dressing).  You have chills.  You have a fever or persistent symptoms for more than 72 hours.  You have a fever and your symptoms suddenly get worse.  Your arm becomes pale, cool, tingly, or numb.  You have heavy bleeding from the site. Hold pressure on the site.   Increase activity slowly    Complete by:  As directed       Discharge Medications   Current Discharge Medication List    CONTINUE these medications which have NOT CHANGED   Details  albuterol (PROVENTIL  HFA) 108 (90 Base) MCG/ACT inhaler Inhale 1 puff into the lungs every 6 (six) hours as needed. Qty: 1 Inhaler, Refills: 2    amLODipine (NORVASC) 2.5 MG tablet Take 1 tablet (2.5 mg total) by mouth daily. Qty: 90 tablet, Refills: 3    aspirin 81 MG EC tablet Take 1 tablet (81 mg total) by mouth at bedtime. Qty: 30 tablet    clobetasol cream (TEMOVATE) 8.75 % Apply 1 application topically daily as needed (eczema).    EFFIENT 10 MG TABS tablet TAKE 1 TABLET (10 MG TOTAL) BY MOUTH DAILY. Qty: 30 tablet, Refills: 6    metoprolol succinate (TOPROL-XL) 25 MG 24 hr tablet Take 1 tablet (25 mg total) by mouth daily. Take with or immediately following a meal. Qty: 30 tablet, Refills: 6    nitroGLYCERIN (NITROSTAT) 0.4 MG SL tablet Place 1 tablet (0.4 mg total) under the tongue every 5 (five) minutes as needed for chest pain. Qty: 25 tablet, Refills: 3    omeprazole (PRILOSEC) 20 MG capsule Take 1 capsule (20 mg total) by mouth daily. Qty: 30 capsule, Refills: 6    rosuvastatin (CRESTOR) 20 MG tablet Take 1 tablet (20 mg total) by mouth daily. Qty: 30 tablet, Refills: 6          Outstanding Labs/Studies   None  Duration of Discharge Encounter   Greater than 30 minutes including physician time.  Signed, Reino Bellis NP-C 05/19/2016, 4:04 PM

## 2016-05-20 ENCOUNTER — Encounter (HOSPITAL_COMMUNITY): Payer: Self-pay | Admitting: Cardiology

## 2016-05-26 ENCOUNTER — Encounter: Payer: Self-pay | Admitting: Physician Assistant

## 2016-05-26 ENCOUNTER — Ambulatory Visit (INDEPENDENT_AMBULATORY_CARE_PROVIDER_SITE_OTHER): Payer: PPO | Admitting: Physician Assistant

## 2016-05-26 VITALS — BP 96/68 | HR 80 | Ht 72.0 in | Wt 186.8 lb

## 2016-05-26 DIAGNOSIS — Z79899 Other long term (current) drug therapy: Secondary | ICD-10-CM | POA: Diagnosis not present

## 2016-05-26 DIAGNOSIS — Z9861 Coronary angioplasty status: Secondary | ICD-10-CM

## 2016-05-26 DIAGNOSIS — E785 Hyperlipidemia, unspecified: Secondary | ICD-10-CM | POA: Diagnosis not present

## 2016-05-26 DIAGNOSIS — I10 Essential (primary) hypertension: Secondary | ICD-10-CM | POA: Diagnosis not present

## 2016-05-26 DIAGNOSIS — I2510 Atherosclerotic heart disease of native coronary artery without angina pectoris: Secondary | ICD-10-CM

## 2016-05-26 DIAGNOSIS — Z720 Tobacco use: Secondary | ICD-10-CM | POA: Diagnosis not present

## 2016-05-26 MED ORDER — ROSUVASTATIN CALCIUM 40 MG PO TABS: 40.0000 mg | ORAL_TABLET | Freq: Every day | ORAL | 3 refills | Status: DC

## 2016-05-26 NOTE — Patient Instructions (Addendum)
Medication Instructions:  INCREASE Crestor to 40mg  one time daily.   Labwork: Please return for FASTING blood work in 6 weeks (Lipid panel, Hepatic function)  Testing/Procedures: NONE  Follow-Up: Your physician recommends that you schedule a follow-up appointment in: 2-3 MONTHS with Dr. Ellyn Hack.   Any Other Special Instructions Will Be Listed Below (If Applicable).  Your physician has requested that you regularly monitor and record your blood pressure readings at home. Please use the same machine at the same time of day to check your readings and record them to bring to your follow-up visit.  If blood pressure consistently runs less than 95 systolic (top number) please call us to let us know as medication changes may need to be made.     If you need a refill on your cardiac medications before your next appointment, please call your pharmacy.

## 2016-05-26 NOTE — Progress Notes (Signed)
Cardiology Office Note    Date:  05/27/2016   ID:  Troy Parrish, DOB 1960-07-07, MRN [id number]  PCP:  Webb Silversmith, NP  Cardiologist:  Dr. Ellyn Hack  Chief Complaint  Patient presents with  . Hospitalization Follow-up    seen for Dr. Ellyn Hack, s/p PCI    History of Present Illness:  Troy Parrish is a 56 y.o. male with PMH of HTN, HLD, seizure and CAD s/p multiple PCIs. He had an anterior STEMI in 2001 treated with BMS to LAD 2. He had a redo PCI to proximal LAD in June 2015. He had DES to OM and left circumflex in 2006. He had BMS to RCA in 2013. He underwent recent stress test on 04/27/2016, though no report was read as low risk with EF of 57%, however on review by Dr. Ellyn Hack, he was concerned there is mild ischemia in the inferior wall. He underwent cardiac catheterization on 05/19/2016, this showed 85% OM 2 lesion proximal to the previously placed stent, this was treated with a 2.5 x 16 mm Synergy DES. Previous stent in the proximal RCA, distal RCA, proximal to mid LAD were patent, EF 45-50%. He is intolerant to Imdur and Ranexa. His losartan was stopped due to dizziness. Amlodipine was added for antianginal purpose.   He is feeling very well after his surgery, he is no longer having any chest discomfort or shortness of breath. He is getting a gym membership in order to get back to activity. We went over his cath report and also emphasize on the compliance with dual antiplatelet therapy. He is currently on both Effient and aspirin. His LDL and triglyceride was uncontrolled during the recent hospitalization, I will increase his Crestor to 40 mg daily and repeat a fasting lipid test and LFT in 6 weeks. Otherwise he is doing very well from cardiology perspective.    Past Medical History:  Diagnosis Date  . CAD S/P percutaneous coronary angioplasty 07/07/2011; 6 & 10 2015   S/P PCI to all 3 major vessels;a)  Ant STEMI 2001- BMS-LAD x2 -->(redo PCI 6/'15 -- pLAD Xience DES 2.5 x 12- 2.75 mm, mLAD  2.25 x 12 - 2.7 mm), b) '06 UA --> Cx- OM DES; c) 2013 Inf MI BMS mRCA --> d) 10/'15 PCI dRCA Promus P DES 4.0 x 16 (4.25 mm); PTCA of RPL2 (2.0 mm) &RPDA (2.25 mm) 11/2013 4/18 PCI DES--> OM1  . Chronic bronchitis ([place]) "used to get it q yr"  . COPD (chronic obstructive pulmonary disease) ([place])   . Elevated WBC count   . Emphysema of lung ([place])   . Essential hypertension 07/07/2011  . Former heavy tobacco smoker     quit in May 2015 after multiple attempts at trying to quit before   . GERD (gastroesophageal reflux disease)   . Glucose intolerance (pre-diabetes) June 2015    hemoglobin A1c 6.6  . Headache    "Imdur related; stopped taking it; headaches went away" (11/15/2013)  . Heart murmur   . History of colon polyps   . History of stomach ulcers   . Hyperlipidemia with target LDL less than 70 07/07/2011  . Metabolic syndrome    Pre-diabetes, hypertension and truncal obesity as well as dyslipidemia  . OSA on CPAP   . Pneumonia "several times"  . Recurrent upper respiratory infection (URI)   . Seizures ([place]) "several"   "last one was ~ 2011" (11/15/2013)  . Shortness of breath dyspnea   . ST elevation myocardial infarction (STEMI) of  anterior wall (HCC) 2001   History of -- 2 stents in early and distal mid LAD; prior cardiologist was Dr. Derwood Kaplan in [place]  . ST elevation myocardial infarction (STEMI) of inferior wall (HCC) 07/07/2011   Occluded RCA 2.75X18 INTEGRITY; Echo 07/2013: EF 45-50%, Inferior & Posterolateral HK.   Marland Kitchen Umbilical hernia   . Unstable angina (HCC)  2006; June 2015   Cx-OM - PCI 2.5 mm 13 mm Cypher DES Jinny Blossom, [place]) ; 07/2013: Severe ISR of both prox & Distal LAD stentS --> 2 DES stents (1 at prox edge of the proximal stent, 2nd covers the entire distal stent as well as proximal and distal edge stenose)   . Upper respiratory infection     Past Surgical History:  Procedure Laterality Date  . Cardiac MRI [facility]  09/2014   EF 51%. Mod HK of basal-mid Inf  wall, mild HK of basal inferoseptum & basal-mid inferolateral wall with hyper enhancement (c/w subendocard RCA MI with significant viability), focal hyper enhancement of apical wall c/w dLAD subendocard MI & complete LAD viability. . No aortic stenosis. Normal RV function. No Ischemia on Adenosine Stress.  . COLONOSCOPY WITH PROPOFOL N/A 03/28/2015   Procedure: COLONOSCOPY WITH PROPOFOL;  Surgeon: Midge Minium, MD;  Location: [facility] SURGERY CNTR;  Service: Endoscopy;  Laterality: N/A;  . CORONARY ANGIOPLASTY WITH STENT PLACEMENT  2001   ANTERIOR mi with a  PCI to LAD; [place], [place] - Dr. Elson Clan  . CORONARY ANGIOPLASTY WITH STENT PLACEMENT  2006   [place] by Dr Elson Clan -lesion lft circ/OM1 with 2.5 x 52mm Cypher DES  . CORONARY STENT INTERVENTION N/A 05/19/2016   Procedure: Coronary Stent Intervention;  Surgeon: Marykay Lex, MD;  Location: [facility] INVASIVE CV LAB;  Service: Cardiovascular;  Laterality: N/A;  . ESOPHAGOGASTRODUODENOSCOPY (EGD) WITH PROPOFOL N/A 03/28/2015   Procedure: ESOPHAGOGASTRODUODENOSCOPY (EGD) WITH PROPOFOL;  Surgeon: Midge Minium, MD;  Location: [facility] SURGERY CNTR;  Service: Endoscopy;  Laterality: N/A;  . INSERTION OF MESH N/A 04/02/2015   Procedure: INSERTION OF MESH;  Surgeon: Lattie Haw, MD;  Location: [facility] ORS;  Service: General;  Laterality: N/A;  . LEFT AND RIGHT HEART CATHETERIZATION WITH CORONARY ANGIOGRAM N/A 07/30/2013   Procedure: LEFT AND RIGHT HEART CATHETERIZATION WITH CORONARY ANGIOGRAM;  Surgeon: Marykay Lex, MD;  Location: [facility] CATH LAB;  Service: Cardiovascular;  2 separate P & mLAD lesions -> PCI, MOd-Severe dRCA disesase with diffuse RPL/PDA disease (FFR)  . LEFT HEART CATH  08/03/2013   Procedure: LEFT HEART CATH;  Surgeon: Marykay Lex, MD;  Location: [facility] CATH LAB;  Service: Cardiovascular;;FFR of RCA - non-flow-limiting  . LEFT HEART CATH AND CORONARY ANGIOGRAPHY N/A 05/19/2016   Procedure: Left Heart Cath and Coronary Angiography;  Surgeon: Marykay Lex, MD;  Location: [facility] INVASIVE CV LAB;  Service: Cardiovascular;  Laterality: N/A;  . LEFT HEART CATHETERIZATION WITH CORONARY ANGIOGRAM N/A 07/07/2011   Procedure: LEFT HEART CATHETERIZATION WITH CORONARY ANGIOGRAM;  Surgeon: Marykay Lex, MD;  Location: [facility] CATH LAB;  Service: Cardiovascular;  inferolat post LV infarct ST-elevation MI  . MET/CPET  11/09/2011   submax. effort 1.05 RER peak V02 was 51% ,chronotropic incomp.hrt rate lows 80s to 100  . MET/CPET  September 2016   [facility]: Mild functional impairment due primarily to mild pulmonary and circulatory limitations. Also suggest physical deconditioning (seen with low-normal aerobic reserve). Mild VQ mismatch with exercise.  Ventilatory reserve exhausted with peak exercise demonstrated pulmonary limitation. No significant decrease in postexercise FEV1  compared to rest --> suggest no exercise induced bronchospasm  . NM MYOVIEW ([facility] HX)  11/30/2011   EF 45% ,exercise 10 METS, infarct\scar w mild perinfarct ischemia -basal inferolat and mid inferolat region  . NM MYOVIEW LTD  04/25/2016   Exercised for 9 minutes. Reached 90% max. Heart rate. 9.4 METS. Read as a low risk study with normal EF 55-65%. -> on Review -  there does appear to be a small to moderate sized, medium severtiy reversible perfusion defect in the anterior wall - read by computer, but not noted by reader.  Marland Kitchen PERCUTANEOUS CORONARY STENT INTERVENTION (PCI-S)  07/07/2011   Procedure: PERCUTANEOUS CORONARY STENT INTERVENTION (PCI-S);  Surgeon: Marykay Lex, MD;  Location: [facility] CATH LAB;  Service: Cardiovascular;;occluded RCA ,Integrity Resolute DES 2.75 x 18 mm stent - post-dilated 3.1 mm  . PERCUTANEOUS CORONARY STENT INTERVENTION (PCI-S)  07/30/2013   Procedure: PERCUTANEOUS CORONARY STENT INTERVENTION (PCI-S);  Surgeon: Marykay Lex, MD;  Location: [facility] CATH LAB;  Service: Cardiovascular;;Distal mid LAD: Xience Alpine DES 2.25 mm x 28 mm (overlapping proximal and distal edge of  previous stent) - 2.7 mm; proximal LAD 2.5 mm x 12 mm Xience Alpine DES (2.8 mm);; RCA 60-70% stenosis planned staged procedure  . PERCUTANEOUS CORONARY STENT INTERVENTION (PCI-S) N/A 11/15/2013   Procedure: PERCUTANEOUS CORONARY STENT INTERVENTION (PCI-S);  Surgeon: Marykay Lex, MD;  Location: [facility] CATH LAB;  Service: Cardiovascular;  Patent Cx & LAD Stents; PCI -dRCA Promus Premier DES 4.0 mm x 16 mm (4.25 mm) dRCA, 2.0 mm Cutting PTCA of RPL2 [place] & POBA of mRPDA  . POLYPECTOMY  03/28/2015   Procedure: POLYPECTOMY;  Surgeon: Midge Minium, MD;  Location: [facility] SURGERY CNTR;  Service: Endoscopy;;  . RIGHT HEART CATH  June 2015   Normal pressures with severely reduced cardiac output and index. (3.25/1.55)  . TRANSTHORACIC ECHOCARDIOGRAM  07/31/2013; February 2017   a. LVEF 45-50. Inferior posterior hypokinesis with mild dilation. Apparent normal diastolic pressures;   . UMBILICAL HERNIA REPAIR N/A 04/02/2015   Procedure: HERNIA REPAIR UMBILICAL ADULT;  Surgeon: Lattie Haw, MD;  Location: [facility] ORS;  Service: General;  Laterality: N/A;    Current Medications: Outpatient Medications Prior to Visit  Medication Sig Dispense Refill  . albuterol (PROVENTIL HFA) 108 (90 Base) MCG/ACT inhaler Inhale 1 puff into the lungs every 6 (six) hours as needed. (Patient taking differently: Inhale 1 puff into the lungs every 6 (six) hours as needed for wheezing or shortness of breath. ) 1 Inhaler 2  . amLODipine (NORVASC) 2.5 MG tablet Take 1 tablet (2.5 mg total) by mouth daily. 90 tablet 3  . aspirin 81 MG EC tablet Take 1 tablet (81 mg total) by mouth at bedtime. 30 tablet   . clobetasol cream (TEMOVATE) 0.05 % Apply 1 application topically daily as needed (eczema).    . EFFIENT 10 MG TABS tablet TAKE 1 TABLET (10 MG TOTAL) BY MOUTH DAILY. (Patient taking differently: Take 10 mg by mouth every evening. ) 30 tablet 6  . metoprolol succinate (TOPROL-XL) 25 MG 24 hr tablet Take 1 tablet (25 mg total) by  mouth daily. Take with or immediately following a meal. (Patient taking differently: Take 25 mg by mouth every evening. Take with or immediately following a meal.) 30 tablet 6  . nitroGLYCERIN (NITROSTAT) 0.4 MG SL tablet Place 1 tablet (0.4 mg total) under the tongue every 5 (five) minutes as needed for chest pain. 25 tablet 3  . omeprazole (PRILOSEC) 20 MG  capsule Take 1 capsule (20 mg total) by mouth daily. (Patient taking differently: Take 20 mg by mouth every evening. ) 30 capsule 6  . rosuvastatin (CRESTOR) 20 MG tablet Take 1 tablet (20 mg total) by mouth daily. (Patient taking differently: Take 20 mg by mouth every evening. ) 30 tablet 6   No facility-administered medications prior to visit.      Allergies:   Niacin and related   Social History   Social History  . Marital status: Married    Spouse name: N/A  . Number of children: N/A  . Years of education: N/A   Social History Main Topics  . Smoking status: Current Every Day Smoker    Packs/day: 0.50    Years: 40.00    Types: Cigarettes    Last attempt to quit: 06/19/2013  . Smokeless tobacco: Never Used     Comment: Quit with Bupropion '150mg'$  - > unfortunately, he restarted  . Alcohol use 0.6 oz/week    1 Cans of beer per week     Comment: rare  . Drug use: No  . Sexual activity: Yes   Other Topics Concern  . None   Social History Narrative   He is a married father of 40, grandfather of 25. He does not really get routine exercise.    He is down to 5 or 6 cigarettes a day. He is very seriously wanting to quit. This is a significant cutback for him, but he has pretty much been told he needs to stop by his wife, and so he fully intends to do so. He is willing to try the patches or whatever. He has a social alcoholic beverage every now and then.      Family History:  The patient's family history includes Cancer in his maternal aunt; Coronary artery disease in his father; Diabetes in his maternal grandfather, maternal  grandmother, and mother; Emphysema in his maternal grandfather; Heart disease in his father, paternal grandfather, and paternal grandmother; Hyperlipidemia in his maternal grandfather, maternal grandmother, and mother; Hypertension in his father; Stroke in his father.   ROS:   Please see the history of present illness.    ROS All other systems reviewed and are negative.   PHYSICAL EXAM:   VS:  BP 96/68   Pulse 80   Ht 6' (1.829 m)   Wt 186 lb 12.8 oz (84.7 kg)   BMI 25.33 kg/m    GEN: Well nourished, well developed, in no acute distress  HEENT: normal  Neck: no JVD, carotid bruits, or masses Cardiac: RRR; no murmurs, rubs, or gallops,no edema  Respiratory:  clear to auscultation bilaterally, normal work of breathing GI: soft, nontender, nondistended, + BS MS: no deformity or atrophy  Skin: warm and dry, no rash Neuro:  Alert and Oriented x 3, Strength and sensation are intact Psych: euthymic mood, full affect  Wt Readings from Last 3 Encounters:  05/26/16 186 lb 12.8 oz (84.7 kg)  05/19/16 184 lb (83.5 kg)  05/13/16 186 lb 3.2 oz (84.5 kg)      Studies/Labs Reviewed:   EKG:  EKG is ordered today.  The ekg ordered today demonstrates Normal sinus rhythm with T-wave inversion in the lateral leads  Recent Labs: 03/30/2016: ALT 26 05/13/2016: BUN 17; Creat 1.01; Hemoglobin 16.9; Platelets 389; Potassium 5.3; Sodium 139   Lipid Panel    Component Value Date/Time   CHOL 188 03/30/2016 1424   TRIG 177.0 (H) 03/30/2016 1424   HDL 38.40 (L) 03/30/2016  1424   CHOLHDL 5 03/30/2016 1424   VLDL 35.4 03/30/2016 1424   LDLCALC 114 (H) 03/30/2016 1424   LDLDIRECT 109.0 09/03/2015 1622    Additional studies/ records that were reviewed today include:   Myoview 04/27/2016 Study Highlights    Nuclear stress EF: 57%.  The left ventricular ejection fraction is normal (55-65%).  The study is normal.  This is a low risk study.    Cath 05/19/2016 Conclusion     1st Mrg-2  lesion, 85 %stenosed. Just proximal to previous stent (Cypher DES 2.5 mm x 13 mm)  A STENT SYNERGY DES 2.5X16 drug eluting stent was successfully placed, and overlaps previously placed stent.  Post intervention, there is a 0% residual stenosis.  ____Remaining Stents & PTCA Sites Patent______  Prox RCA Resolute DES 3.75 mm x 18 mm (4.0 mm), 0 %stenosed.  Dist RCA Promus DES 4.0 mm x 16 mm (4.25 mm), 0 %stenosed.  2nd RPLB lesion, 70 %stenosed. 2 small for PTCA  RPDA lesion, 30 %stenosed. Previous PTCA site  Post Atrio lesion, 10 %stenosed. Previous PTCA site  Prox LAD to Mid LAD BMS overlapped with Xience DES 2.59mm x 1mm (2.51mm), 0 %stenosed.  Dist LAD BMS covered with a Xience DES 2.25 mm x 28 mm (2.7 mm), 0 %stenosed.  Mid LAD to Dist LAD lesion, 45 %stenosed - in the intervening segment between the stented segments  There is mild left ventricular systolic dysfunction.  LV end diastolic pressure is normal.  The left ventricular ejection fraction is 45-50% by visual estimate.   Culprit lesion goes along with the inferolateral ischemia last saw on the stress test. 85% pre-stent stenosis in the OM1 treated with overlapping DES stent.  Other stents and PTCA sites widely patent. Stable mildly reduced LVEF with normal LVEDP  Plan: Standard post radial band removal  Stable PCI. Candidate for same-day discharge  Continue home dose of aspirin and Effient  Continue home dose of Toprol, and if blood pressure will tolerate, can restart amlodipine 2.5 gram.   He will follow-up in clinic with either myself or APP next week.       ASSESSMENT:    1. CAD S/P percutaneous coronary angioplasty   2. Medication management   3. Hyperlipidemia with target LDL less than 70; by his report he is back on Crestor   4. Essential hypertension   5. Tobacco abuse      PLAN:  In order of problems listed above:  1. CAD s/p multiple PCIs: He has had history of RCA, LAD, and the left  circumflex stenting in the past. Recently, he was complaining of some chest discomfort, Myoview was initially interpreted as normal, however upon further reading by Dr. Herbie Baltimore, it was felt the inferior portion likely has mild ischemia. Patient underwent cardiac catheterization and received drug-eluting stent to OM 2. He is doing well after the procedure, he is no longer complaining of any chest discomfort or shortness of breath. Emphasis has been placed on compliance with aspirin and Effient.   2. HTN: Blood pressure borderline 96/68. Will continue on current dose of amlodipine and metoprolol, he will need to monitor his blood pressure on daily basis. He will contact us if his systolic blood pressure consistently goes to the low 90s. If so, we may have to stop the amlodipine.  3. HLD: He will need better control, last lipid panel 03/30/2016, LDL 114, HDL 38, triglyceride 177, I will increase his Crestor to 40 mg daily. Repeat LFT and fasting  lipid panel in 6 weeks.  4. Tobacco abuse: counseled on tobacco cessation    Medication Adjustments/Labs and Tests Ordered: Current medicines are reviewed at length with the patient today.  Concerns regarding medicines are outlined above.  Medication changes, Labs and Tests ordered today are listed in the Patient Instructions below. Patient Instructions  Medication Instructions:  INCREASE Crestor to '40mg'$  one time daily.   Labwork: Please return for FASTING blood work in 6 weeks (Lipid panel, Hepatic function)  Testing/Procedures: NONE  Follow-Up: Your physician recommends that you schedule a follow-up appointment in: 2-3 MONTHS with Dr. Ellyn Hack.   Any Other Special Instructions Will Be Listed Below (If Applicable).  Your physician has requested that you regularly monitor and record your blood pressure readings at home. Please use the same machine at the same time of day to check your readings and record them to bring to your follow-up visit.  If  blood pressure consistently runs less than 95 systolic (top number) please call us to let us know as medication changes may need to be made.     If you need a refill on your cardiac medications before your next appointment, please call your pharmacy.      Hilbert Corrigan, [place]  05/27/2016 6:17 AM    [address], [place], [place]  [address] Phone: [phone]; Fax: [phone]

## 2016-05-27 ENCOUNTER — Encounter: Payer: Self-pay | Admitting: Physician Assistant

## 2016-05-28 ENCOUNTER — Telehealth (HOSPITAL_COMMUNITY): Payer: Self-pay | Admitting: Internal Medicine

## 2016-05-28 NOTE — Telephone Encounter (Signed)
S/w LaShandra with [employer] verifying insurance benefits through [employer]. No coinsurance or Deductible, Copay $15.00, Out of Pocket, $3400.00, pt has met $220.00... Reference #[id number]... KJ

## 2016-06-02 ENCOUNTER — Telehealth (HOSPITAL_COMMUNITY): Payer: Self-pay | Admitting: Internal Medicine

## 2016-06-03 ENCOUNTER — Ambulatory Visit: Payer: PPO | Admitting: Cardiology

## 2016-06-10 ENCOUNTER — Telehealth (HOSPITAL_COMMUNITY): Payer: Self-pay | Admitting: Internal Medicine

## 2016-06-18 ENCOUNTER — Encounter (HOSPITAL_COMMUNITY): Payer: Self-pay | Admitting: Internal Medicine

## 2016-06-18 NOTE — Progress Notes (Signed)
Lft msg for pt to return call and mailed letter w/CRP... KJ

## 2016-07-20 ENCOUNTER — Ambulatory Visit (INDEPENDENT_AMBULATORY_CARE_PROVIDER_SITE_OTHER): Payer: PPO | Admitting: Cardiology

## 2016-07-20 VITALS — BP 102/78 | HR 89 | Ht 72.0 in | Wt 185.2 lb

## 2016-07-20 DIAGNOSIS — I209 Angina pectoris, unspecified: Secondary | ICD-10-CM

## 2016-07-20 DIAGNOSIS — Z9861 Coronary angioplasty status: Secondary | ICD-10-CM

## 2016-07-20 DIAGNOSIS — Z9119 Patient's noncompliance with other medical treatment and regimen: Secondary | ICD-10-CM | POA: Diagnosis not present

## 2016-07-20 DIAGNOSIS — I255 Ischemic cardiomyopathy: Secondary | ICD-10-CM | POA: Diagnosis not present

## 2016-07-20 DIAGNOSIS — I5042 Chronic combined systolic (congestive) and diastolic (congestive) heart failure: Secondary | ICD-10-CM | POA: Diagnosis not present

## 2016-07-20 DIAGNOSIS — I2510 Atherosclerotic heart disease of native coronary artery without angina pectoris: Secondary | ICD-10-CM

## 2016-07-20 DIAGNOSIS — E785 Hyperlipidemia, unspecified: Secondary | ICD-10-CM

## 2016-07-20 DIAGNOSIS — Z91199 Patient's noncompliance with other medical treatment and regimen due to unspecified reason: Secondary | ICD-10-CM

## 2016-07-20 NOTE — Progress Notes (Signed)
PCP: Jearld Fenton, NP  Clinic Note: Chief Complaint  Patient presents with  . Follow-up    pt denied chest pain and SOB  . Coronary Artery Disease   HPI: Troy Parrish is a 56 y.o. male with a PMH below who presents today for post PCI follow-up #2. He has a very significant multivessel coronary disease history with at least 3 STEMI's and at least 3 if not 4 episodes of progressive angina if not unstable angina leading to additional PCI. He also has very difficult/poorly controlled lipids. He intermittently goes back to smoking, but is now in the process of trying to complete his quitting.  He was seen by Almyra Deforest after his most recent PCI on April 18th. He had PCI on April 11th.He was doing very well with no further chest pain. Crestor was increased to 40 mg daily.  Recent Hospitalizations: He had a same-day discharge after his last PCI.  Studies Personally Reviewed - (if available, images/films reviewed: From Epic Chart or Care Everywhere) Procedures  Coronary Stent Intervention  Left Heart Cath and Coronary Angiography  Conclusion  1st Mrg-2 lesion, 85 %stenosed. Just proximal to previous stent (Cypher DES 2.5 mm x 13 mm)  A STENT SYNERGY DES 2.5X16 drug eluting stent was successfully placed, and overlaps previously placed stent. Post intervention, there is a 0% residual stenosis.  ____Remaining Stents & PTCA Sites Patent______  Prox RCA Resolute DES 3.75 mm x 18 mm (4.0 mm), 0 %stenosed. Dist RCA Promus DES 4.0 mm x 16 mm (4.25 mm), 0 %stenosed.  2nd RPLB lesion, 70 %stenosed. 2 small for PTCA; RPDA lesion, 30 %stenosed. Previous PTCA site ; Post Atrio lesion, 10 %stenosed. Previous PTCA site  Prox LAD to Mid LAD BMS overlapped with Xience DES 2.338m x 1538m(2.38m77m 0 %stenosed. Dist LAD BMS covered with a Xience DES 2.25 mm x 28 mm (2.7 mm), 0 %stenosed.  Mid LAD to Dist LAD lesion, 45 %stenosed - in the intervening segment between the stented segments  There is mild left  ventricular systolic dysfunction. The left ventricular ejection fraction is 45-50% by visual estimate.  LV end diastolic pressure is normal.  Culprit lesion goes along with the inferolateral ischemia last saw on the stress test. 85% pre-stent stenosis in the OM1 treated with overlapping DES stent.  Other stents and PTCA sites widely patent.  Stable mildly reduced LVEF with normal LVEDP  Diagnostic Diagram         Post-Intervention Diagram          OM1 PCI: SYNERGY DES 2.5X16 -overlaps OLD stent.       Interval History: RauChristophesents today in amazing spirits. He says that he feels great. He said as soon as he got home from his PCI wrap the stairs and was so surprised at how well he felt that he has been happy ever since. He has been propelling and zip lining with his family. He has been traveling. His in great spirits. He is back doing exercise. He is again trying to focus on stopping cigarettes, but does acknowledge that he has had a few. He is not had any myalgias or arthralgias. His energy level is great. No chest pain or pressure with rest or exertion. No PND, orthopnea or edema. No rapid irregular heartbeats palpitations. No syncope/near-syncope, or TIA/amaurosis fugax  No claudication. No bleeding issues with epistaxis, hematochezia, melena, dysuria. Bruising.  ROS: A comprehensive was performed. Review of Systems  Constitutional: Negative for malaise/fatigue.  HENT: Negative  for congestion and nosebleeds.   Respiratory: Negative for cough and shortness of breath.   Gastrointestinal: Negative for blood in stool, constipation and heartburn.  Genitourinary: Negative for dysuria and frequency.  Musculoskeletal: Negative for back pain, falls, joint pain and myalgias.  Skin: Negative.   Neurological: Negative for dizziness.  Endo/Heme/Allergies: Negative for environmental allergies. Bruises/bleeds easily (But not overly worrisome).  Psychiatric/Behavioral: Negative for depression and memory  loss. The patient is not nervous/anxious.   All other systems reviewed and are negative.  I have reviewed and (if needed) personally updated the patient's problem list, medications, allergies, past medical and surgical history, social and family history.   Past Medical History:  Diagnosis Date  . CAD S/P percutaneous coronary angioplasty 07/07/2011; 6 & 10 2015   S/P PCI to all 3 major vessels;a)  Ant STEMI 2001- BMS-LAD x2 -->(redo PCI 6/'15 -- pLAD Xience DES 2.5 x 12- 2.75 mm, mLAD 2.25 x 12 - 2.7 mm), b) '06 UA --> Cx- OM DES; c) 2013 Inf MI BMS mRCA --> d) 10/'15 PCI dRCA Promus P DES 4.0 x 16 (4.25 mm); PTCA of RPL2 (2.0 mm) &RPDA (2.25 mm) 11/2013; 4/18 PCI OM1 Synergy DES  2.5 x 16  . Chronic bronchitis ([place]) "used to get it q yr"  . COPD (chronic obstructive pulmonary disease) ([place])   . Elevated WBC count   . Emphysema of lung ([place])   . Essential hypertension 07/07/2011  . Former heavy tobacco smoker     quit in May 2015 after multiple attempts at trying to quit before   . GERD (gastroesophageal reflux disease)   . Glucose intolerance (pre-diabetes) June 2015    hemoglobin A1c 6.6  . Headache    "Imdur related; stopped taking it; headaches went away" (11/15/2013)  . Heart murmur   . History of colon polyps   . History of stomach ulcers   . Hyperlipidemia with target LDL less than 70 07/07/2011  . Metabolic syndrome    Pre-diabetes, hypertension and truncal obesity as well as dyslipidemia  . OSA on CPAP   . Pneumonia "several times"  . Recurrent upper respiratory infection (URI)   . Seizures ([place]) "several"   "last one was ~ 2011" (11/15/2013)  . Shortness of breath dyspnea   . ST elevation myocardial infarction (STEMI) of anterior wall ([place]) 2001   History of -- 2 stents in early and distal mid LAD; prior cardiologist was Dr. Remi Haggard in [place]  . ST elevation myocardial infarction (STEMI) of inferior wall ([place]) 07/07/2011   Occluded RCA 0.93G18 INTEGRITY; Echo  07/2013: EF 45-50%, Inferior & Posterolateral HK.   Marland Kitchen Umbilical hernia   . Unstable angina ([place])  2006; June 2015   Cx-OM - PCI 2.5 mm 13 mm Cypher DES [name], [place]) ; 07/2013: Severe ISR of both prox & Distal LAD stentS --> 2 DES stents (1 at prox edge of the proximal stent, 2nd covers the entire distal stent as well as proximal and distal edge stenose)   . Upper respiratory infection     Past Surgical History:  Procedure Laterality Date  . Cardiac MRI [facility]  09/2014   EF 51%. Mod HK of basal-mid Inf wall, mild HK of basal inferoseptum & basal-mid inferolateral wall with hyper enhancement (c/w subendocard RCA MI with significant viability), focal hyper enhancement of apical wall c/w dLAD subendocard MI & complete LAD viability. . No aortic stenosis. Normal RV function. No Ischemia on Adenosine Stress.  . COLONOSCOPY WITH PROPOFOL N/A 03/28/2015  Procedure: COLONOSCOPY WITH PROPOFOL;  Surgeon: Lucilla Lame, MD;  Location: [place];  Service: Endoscopy;  Laterality: N/A;  . CORONARY ANGIOPLASTY WITH STENT PLACEMENT  2001   ANTERIOR mi with a  PCI to LAD; [place], [place] - Dr. Dorris Fetch  . CORONARY ANGIOPLASTY WITH STENT PLACEMENT  2006   [place] by Dr Dorris Fetch -lesion lft circ/OM1 with 2.5 x 27m Cypher DES  . CORONARY STENT INTERVENTION N/A 05/19/2016   Procedure: Coronary Stent Intervention;  Surgeon: DLeonie Man MD;  [facility];  Service: Cardiovascular: PCI pOM1 - Synergy DES 2.5 x 16 (overlaps old stent)  . ESOPHAGOGASTRODUODENOSCOPY (EGD) WITH PROPOFOL N/A 03/28/2015   Procedure: ESOPHAGOGASTRODUODENOSCOPY (EGD) WITH PROPOFOL;  Surgeon: DLucilla Lame MD;  [place] [facility]  Service: Endoscopy;  Laterality: N/A;  . INSERTION OF MESH N/A 04/02/2015   Procedure: INSERTION OF MESH;  Surgeon: RFlorene Glen MD;  Location: [facility];  Service: General;  Laterality: N/A;  . LEFT AND RIGHT HEART CATHETERIZATION WITH CORONARY ANGIOGRAM N/A 07/30/2013    Procedure: LEFT AND RIGHT HEART CATHETERIZATION WITH CORONARY ANGIOGRAM;  Surgeon: DLeonie Man MD;  [facility];  Service: Cardiovascular;  2 separate P & mLAD lesions -> PCI, MOd-Severe dRCA disesase with diffuse RPL/PDA disease (FFR)  . LEFT HEART CATH  08/03/2013   Procedure: LEFT HEART CATH;  Surgeon: DLeonie Man MD;  [facility];  Service: Cardiovascular;;FFR of RCA - non-flow-limiting  . LEFT HEART CATH AND CORONARY ANGIOGRAPHY N/A 05/19/2016   Procedure: Left Heart Cath and Coronary Angiography;  Surgeon: DLeonie Man MD;  [facility];  Service: Cardiovascular: CULPRIT - 85% pOM1 pre-stent. Stents in both proximal and distal RCA widely patent. PTCA sites in RPL & PDA widely patent with stable 70% small RPL 2. Extensive stenting in the LAD widely patent stents with mild to mod Dz btw prox & mid-distal stents.   .Marland KitchenLEFT HEART CATHETERIZATION WITH CORONARY ANGIOGRAM N/A 07/07/2011   Procedure: LEFT HEART CATHETERIZATION WITH CORONARY ANGIOGRAM;  Surgeon: DLeonie Man MD;  [facility];  Service: Cardiovascular;  inferolat post LV infarct ST-elevation MI  . MET/CPET  11/09/2011   submax. effort 1.05 RER peak V02 was 51% ,chronotropic incomp.hrt rate lows 80s to 100  . MET/CPET  September 2016   [facility]: Mild functional impairment due primarily to mild pulmonary and circulatory limitations. Also suggest physical deconditioning (seen with low-normal aerobic reserve). Mild VQ mismatch with exercise.  Ventilatory reserve exhausted with peak exercise demonstrated pulmonary limitation. No significant decrease in postexercise FEV1 compared to rest --> suggest no exercise induced bronchospasm  . [place]  11/30/2011   EF 45% ,exercise 10 METS, infarct\scar w mild perinfarct ischemia -basal inferolat and mid inferolat region  . NM MYOVIEW LTD  04/25/2016   Exercised for 9 minutes. Reached 90% max. Heart rate. 9.4 METS. Read as a low risk  study with normal EF 55-65%. -> on Review -  there does appear to be a small to moderate sized, medium severtiy reversible perfusion defect in the anterior wall - read by computer, but not noted by reader.  .Marland KitchenPERCUTANEOUS CORONARY STENT INTERVENTION (PCI-S)  07/07/2011   Procedure: PERCUTANEOUS CORONARY STENT INTERVENTION (PCI-S);  Surgeon: DLeonie Man MD;  [facility];  Service: Cardiovascular;;occluded RCA ,Integrity Resolute DES 2.75 x 18 mm stent - post-dilated 3.1 mm  . PERCUTANEOUS CORONARY STENT INTERVENTION (PCI-S)  07/30/2013  Procedure: PERCUTANEOUS CORONARY STENT INTERVENTION (PCI-S);  Surgeon: Leonie Man, MD;  Location[facility];  Service: Cardiovascular;;Distal mid LAD: Xience Alpine DES 2.25 mm x 28 mm (overlapping proximal and distal edge of previous stent) - 2.7 mm; proximal LAD 2.5 mm x 12 mm Xience Alpine DES (2.8 mm);; RCA 60-70% stenosis planned staged procedure  . PERCUTANEOUS CORONARY STENT INTERVENTION (PCI-S) N/A 11/15/2013   Procedure: PERCUTANEOUS CORONARY STENT INTERVENTION (PCI-S);  Surgeon: Leonie Man, MD;  [facility];  Service: Cardiovascular;  Patent Cx & LAD Stents; PCI -dRCA Promus Premier DES 4.0 mm x 16 mm (4.25 mm) dRCA, 2.0 mm Cutting PTCA of RPL2 [place] & POBA of mRPDA  . POLYPECTOMY  03/28/2015   Procedure: POLYPECTOMY;  Surgeon: Lucilla Lame, MD;  Location: [place];  Service: Endoscopy;;  . RIGHT HEART CATH  June 2015   Normal pressures with severely reduced cardiac output and index. (3.25/1.55)  . TRANSTHORACIC ECHOCARDIOGRAM  07/31/2013; February 2017   a. LVEF 45-50. Inferior posterior hypokinesis with mild dilation. Apparent normal diastolic pressures;   . UMBILICAL HERNIA REPAIR N/A 04/02/2015   Procedure: HERNIA REPAIR UMBILICAL ADULT;  Surgeon: Florene Glen, MD;  Location: [facility];  Service: General;  Laterality: N/A;    Current Meds  Medication Sig  . albuterol (PROVENTIL HFA) 108 (90 Base) MCG/ACT  inhaler Inhale 1 puff into the lungs every 6 (six) hours as needed. (Patient taking differently: Inhale 1 puff into the lungs every 6 (six) hours as needed for wheezing or shortness of breath. )  . amLODipine (NORVASC) 2.5 MG tablet Take 1 tablet (2.5 mg total) by mouth daily.  Marland Kitchen aspirin 81 MG EC tablet Take 1 tablet (81 mg total) by mouth at bedtime.  . clobetasol cream (TEMOVATE) 9.50 % Apply 1 application topically daily as needed (eczema).  . EFFIENT 10 MG TABS tablet TAKE 1 TABLET (10 MG TOTAL) BY MOUTH DAILY. (Patient taking differently: Take 10 mg by mouth every evening. )  . metoprolol succinate (TOPROL-XL) 25 MG 24 hr tablet Take 1 tablet (25 mg total) by mouth daily. Take with or immediately following a meal. (Patient taking differently: Take 25 mg by mouth every evening. Take with or immediately following a meal.)  . nitroGLYCERIN (NITROSTAT) 0.4 MG SL tablet Place 1 tablet (0.4 mg total) under the tongue every 5 (five) minutes as needed for chest pain.  Marland Kitchen omeprazole (PRILOSEC) 20 MG capsule Take 1 capsule (20 mg total) by mouth daily. (Patient taking differently: Take 20 mg by mouth every evening. )  . rosuvastatin (CRESTOR) 40 MG tablet Take 1 tablet (40 mg total) by mouth daily.    Allergies  Allergen Reactions  . Niacin And Related Hives and Itching   Social History   Social History  . Marital status: Married    Spouse name: N/A  . Number of children: N/A  . Years of education: N/A   Social History Main Topics  . Smoking status: Current Every Day Smoker    Packs/day: 0.50    Years: 40.00    Types: Cigarettes    Last attempt to quit: 06/19/2013  . Smokeless tobacco: Never Used     Comment: Quit with Bupropion '150mg'$  - > unfortunately, he restarted  . Alcohol use 0.6 oz/week    1 Cans of beer per week     Comment: rare  . Drug use: No  . Sexual activity: Yes   Other Topics Concern  . None  Social History Narrative   He is a married father of 28, grandfather of 35.  He does not really get routine exercise.    He is down to 5 or 6 cigarettes a day. He is very seriously wanting to quit. This is a significant cutback for him, but he has pretty much been told he needs to stop by his wife, and so he fully intends to do so. He is willing to try the patches or whatever. He has a social alcoholic beverage every now and then.    family history includes Cancer in his maternal aunt; Coronary artery disease in his father; Diabetes in his maternal grandfather, maternal grandmother, and mother; Emphysema in his maternal grandfather; Heart disease in his father, paternal grandfather, and paternal grandmother; Hyperlipidemia in his maternal grandfather, maternal grandmother, and mother; Hypertension in his father; Stroke in his father.  Wt Readings from Last 3 Encounters:  07/20/16 185 lb 3.2 oz (84 kg)  05/26/16 186 lb 12.8 oz (84.7 kg)  05/19/16 184 lb (83.5 kg)   PHYSICAL EXAM BP 102/78   Pulse 89   Ht 6' (1.829 m)   Wt 185 lb 3.2 oz (84 kg)   SpO2 95%   BMI 25.12 kg/m  General appearance: alert, cooperative, appears stated age, no distress. Well-nourished, well-groomed. Very happy appearing HEENT: [place]/AT, EOMI, MMM, anicteric sclera Neck: no adenopathy, no carotid bruit and no JVD Lungs: clear to auscultation bilaterally, normal percussion bilaterally and non-labored - notably improved from last visit Heart: regular rate and rhythm, S1 &S2 normal, no murmur, click, rub or gallop; nondisplaced PMI Abdomen: soft, non-tender; bowel sounds normal; no masses,  no organomegaly; no HJR Extremities: extremities normal, atraumatic, no cyanosis, or edema  Pulses: 2+ and symmetric;  Skin: mobility and turgor normal, no evidence of bleeding or bruising, no lesions noted and temperature normal Neurologic: Mental status: Alert & oriented x 3, thought content appropriate; non-focal exam.  Pleasant mood & affect.    Adult ECG Report none  Other studies Reviewed: Additional  studies/ records that were reviewed today include:  Recent Labs:   PCP checks Lipids (had stopped statin before b/c fatigue)  Also - "borderline diabetes" Lab Results  Component Value Date   CHOL 188 03/30/2016   HDL 38.40 (L) 03/30/2016   LDLCALC 114 (H) 03/30/2016   LDLDIRECT 109.0 09/03/2015   TRIG 177.0 (H) 03/30/2016   CHOLHDL 5 03/30/2016    ASSESSMENT / PLAN: Problem List Items Addressed This Visit    Angina, class II ([place])    Completely resolved last PCI. Continue to monitor. He is on beta blocker and Norvasc      CAD S/P percutaneous coronary angioplasty (Chronic)    His culprit lesion during the last evaluation exactly matched his stress test. He feels great after the last PCI. Hopefully this will tide him over for a while. It seems like every single time he has a de novo lesion as opposed to in-stent restenosis. I do want him to stay on present 0 as opposed to Plavix for at least a year. He has extensive stent with history of in-stent restenosis in the past. I think he deserves to be on the best potential option. Brilinta caused dyspnea, therefore think we need to stay with Effient. He remains on low-dose amlodipine which was helpful for anginal effect. This is in addition to low-dose Toprol. This is the most we can do in the past with issues of fatigue with higher doses. He is on Crestor.  If he has bruising, after the next month to we can probably stop aspirin.      Relevant Orders   Lipid panel   Cardiomyopathy, ischemic; EF~45% with hypokinesis of the inferior and inferolateral myocardium; CO 3.25 - Primary (Chronic)    I think as long as he is not having any anginal symptoms, he doesn't have any heart failure symptoms. He probably has some more diastolic dysfunction related exertional dyspnea, but for now he is been doing fine without any major complaints. He is gradually picking up his exercising doing well. He has not required any diuretic. He is taking Toprol, and in  the past. Switched him from ACE inhibitor/ARB to the calcium channel blocker to allow for antianginal effect. As long as not showing any signs of heart failure which is significantly continue current medicine. He does appear to be euvolemic.      Chronic combined systolic and diastolic congestive heart failure, NYHA class 2 (HCC) (Chronic)    Euvolemic. No diuretic requirement. On stable dose of Toprol.      History of nonadherence to medical treatment (Chronic)    This is been an ongoing thing for him that he does very well for several months, and then becomes significant goes into a depression mode and starts to come off medications. Almost every single one of his exacerbations has been associated with that. I reiterated the importance of him staying on his medications so that we don't have any more breakthroughs.      Hyperlipidemia with target LDL less than 70; by his report he is back on Crestor (Chronic)    Unfortunately, he just is not doing well from lipid standpoint. I don't think they will get him down to goal with Crestor. I suspect he will probably need PCSK9 inhibitor. Amanda recheck lipids now and then refer him to our pharmacy team in our Cardiovascular Risk Reduction clinic (CV RR)      Relevant Orders   Lipid panel     Current medicines are reviewed at length with the patient today. (+/- concerns) Insurance asking about converting from Effient to Clopidogrel The following changes have been made: Crestor had been increased to '40mg'$  by Mr. Eulas Post  Patient Instructions  LABS -- [facility]  LIPID - DO NOT EAT OR DRINK THE MORNING OF THE TEST.   Your physician recommends that you schedule a follow-up appointment in CVRR- CHOLESTEROL     No other changes      Your physician wants you to follow-up in 6 months with DR HARDING.You will receive a reminder letter in the mail two months in advance. If you don't receive a letter, please call our office to schedule the follow-up  appointment.    If you need a refill on your cardiac medications before your next appointment, please call your pharmacy.     Studies Ordered:   Orders Placed This Encounter  Procedures  . Lipid panel      Glenetta Hew, M.D., M.S. Interventional Cardiologist   Pager # [phone] Phone # [phone] [address] [place], [place] [address]

## 2016-07-20 NOTE — Patient Instructions (Addendum)
LABS -- [facility]  LIPID - DO NOT EAT OR DRINK THE MORNING OF THE TEST.   Your physician recommends that you schedule a follow-up appointment in CVRR- CHOLESTEROL     No other changes      Your physician wants you to follow-up in 6 months with DR HARDING.You will receive a reminder letter in the mail two months in advance. If you don't receive a letter, please call our office to schedule the follow-up appointment.    If you need a refill on your cardiac medications before your next appointment, please call your pharmacy.

## 2016-07-23 ENCOUNTER — Encounter: Payer: Self-pay | Admitting: Cardiology

## 2016-07-23 NOTE — Assessment & Plan Note (Signed)
This is been an ongoing thing for him that he does very well for several months, and then becomes significant goes into a depression mode and starts to come off medications. Almost every single one of his exacerbations has been associated with that. I reiterated the importance of him staying on his medications so that we don't have any more breakthroughs.

## 2016-07-23 NOTE — Assessment & Plan Note (Signed)
I think as long as he is not having any anginal symptoms, he doesn't have any heart failure symptoms. He probably has some more diastolic dysfunction related exertional dyspnea, but for now he is been doing fine without any major complaints. He is gradually picking up his exercising doing well. He has not required any diuretic. He is taking Toprol, and in the past. Switched him from ACE inhibitor/ARB to the calcium channel blocker to allow for antianginal effect. As long as not showing any signs of heart failure which is significantly continue current medicine. He does appear to be euvolemic.

## 2016-07-23 NOTE — Assessment & Plan Note (Signed)
Unfortunately, he just is not doing well from lipid standpoint. I don't think they will get him down to goal with Crestor. I suspect he will probably need PCSK9 inhibitor. Amanda recheck lipids now and then refer him to our pharmacy team in our Cardiovascular Risk Reduction clinic (CV RR)

## 2016-07-23 NOTE — Assessment & Plan Note (Signed)
Completely resolved last PCI. Continue to monitor. He is on beta blocker and Norvasc

## 2016-07-23 NOTE — Assessment & Plan Note (Signed)
Euvolemic. No diuretic requirement. On stable dose of Toprol.

## 2016-07-23 NOTE — Assessment & Plan Note (Signed)
His culprit lesion during the last evaluation exactly matched his stress test. He feels great after the last PCI. Hopefully this will tide him over for a while. It seems like every single time he has a de novo lesion as opposed to in-stent restenosis. I do want him to stay on present 0 as opposed to Plavix for at least a year. He has extensive stent with history of in-stent restenosis in the past. I think he deserves to be on the best potential option. Brilinta caused dyspnea, therefore think we need to stay with Effient. He remains on low-dose amlodipine which was helpful for anginal effect. This is in addition to low-dose Toprol. This is the most we can do in the past with issues of fatigue with higher doses. He is on Crestor. If he has bruising, after the next month to we can probably stop aspirin.

## 2016-07-27 ENCOUNTER — Telehealth: Payer: Self-pay | Admitting: Cardiology

## 2016-07-27 NOTE — Telephone Encounter (Signed)
New message    Lorena from [employer] is calling to find out if prior authorization was received they faxed over. She also asked if it is currently being worked on.

## 2016-07-27 NOTE — Telephone Encounter (Signed)
Left message to secure voice mail - prior authorization was faxed today. Any question may call back

## 2016-08-03 ENCOUNTER — Telehealth: Payer: Self-pay | Admitting: *Deleted

## 2016-08-03 NOTE — Telephone Encounter (Addendum)
Left message to patient --medication was approved    ( effient  10 mg  from 07/29/16 until 02/07/2017)  any question may call back

## 2016-09-04 LAB — LIPID PANEL
Chol/HDL Ratio: 4.4 ratio (ref 0.0–5.0)
Cholesterol, Total: 149 mg/dL (ref 100–199)
HDL: 34 mg/dL — ABNORMAL LOW (ref >39)
LDL Calculated: 66 mg/dL (ref 0–99)
Triglycerides: 243 mg/dL — ABNORMAL HIGH (ref 0–149)
VLDL Cholesterol Cal: 49 mg/dL — ABNORMAL HIGH (ref 5–40)

## 2016-09-06 ENCOUNTER — Other Ambulatory Visit: Payer: Self-pay | Admitting: Family Medicine

## 2016-09-09 ENCOUNTER — Telehealth: Payer: Self-pay | Admitting: Pharmacist Clinician (PhC)/ Clinical Pharmacy Specialist

## 2016-09-09 NOTE — Telephone Encounter (Signed)
Patient returned call.  Reviewed lipid labs with him.  LDL much improved at 66, now tolerating rosuvastatin 40 mg daily without problem.  Triglycerides elevated at 240, advised that he watch "white" foods, and increase vegetables and fruits.  He should also increase his exercise/activities.    Will cancel lipid appointment for today - he should repeat labs in about 6 months.

## 2016-10-06 ENCOUNTER — Telehealth: Payer: Self-pay | Admitting: Student

## 2016-10-06 NOTE — Telephone Encounter (Signed)
  Patient called the after hours line reporting BP elevation during a verbal argument, at 162/112. Reported dizziness and a headache at that time. After removing himself from the argument, he felt significantly better. He walked outside and upon returning to his bedroom, he laid down and says he felt like he lost consciousness for a few seconds. Did not hit his head. No associated chest pain or palpitations. Reports staying well hydrated. He denies any recent anginal symptoms. Hiked a mile this past weekend without any exertional symptoms. Reports good compliance with his medication regimen.   I asked him to recheck his BP and it is at 121/86 currently. He denies any recurrent symptoms and reports overall feeling well. Episode likely of vasovagal etiology in the setting of increased situational stress. Advised him if he has recurrent symptoms, then we could consider event monitor placement in the future.   He voiced understanding of this and was appreciate of the call.    Signed, Erma Heritage, PA-C 10/06/2016, 6:39 PM Pager: [phone]

## 2016-10-07 ENCOUNTER — Telehealth: Payer: Self-pay | Admitting: Cardiology

## 2016-10-07 NOTE — Telephone Encounter (Signed)
Returned call to patient.He stated he blacked out yesterday.Stated he called Dr on call last night and was advised he needs monitor.Stated he would like to be seen first.He feels fine today.Stated he wants to make sure he is ok to go to daughter's wedding in [place] 11/16/16.Appointment scheduled with Rosaria Ferries PA 10/12/16 at 8:30 am.Advised to go to ED if he blacks out again.

## 2016-10-07 NOTE — Telephone Encounter (Signed)
New message      Pt had a syncope spell yesterday.  Talk to the nurse about getting a monitor he and Dr Ellyn Hack had talked about.  Please call

## 2016-10-09 NOTE — Telephone Encounter (Signed)
I do agree that he should be seen. -- need to know the circumstances.  Probably will get a monitor.  ? May need to adjust BP meds.  [place]

## 2016-10-12 ENCOUNTER — Encounter: Payer: Self-pay | Admitting: Physician Assistant

## 2016-10-12 ENCOUNTER — Ambulatory Visit (INDEPENDENT_AMBULATORY_CARE_PROVIDER_SITE_OTHER): Payer: PPO | Admitting: Physician Assistant

## 2016-10-12 ENCOUNTER — Ambulatory Visit: Payer: Self-pay | Admitting: Physician Assistant

## 2016-10-12 VITALS — BP 122/87 | HR 83 | Ht 72.0 in | Wt 182.0 lb

## 2016-10-12 DIAGNOSIS — E785 Hyperlipidemia, unspecified: Secondary | ICD-10-CM | POA: Diagnosis not present

## 2016-10-12 DIAGNOSIS — R55 Syncope and collapse: Secondary | ICD-10-CM | POA: Diagnosis not present

## 2016-10-12 DIAGNOSIS — I255 Ischemic cardiomyopathy: Secondary | ICD-10-CM | POA: Diagnosis not present

## 2016-10-12 DIAGNOSIS — I2510 Atherosclerotic heart disease of native coronary artery without angina pectoris: Secondary | ICD-10-CM

## 2016-10-12 DIAGNOSIS — I10 Essential (primary) hypertension: Secondary | ICD-10-CM | POA: Diagnosis not present

## 2016-10-12 DIAGNOSIS — Z9861 Coronary angioplasty status: Secondary | ICD-10-CM

## 2016-10-12 LAB — CBC
Hematocrit: 50.1 % (ref 37.5–51.0)
Hemoglobin: 17.5 g/dL (ref 13.0–17.7)
MCH: 30.9 pg (ref 26.6–33.0)
MCHC: 34.9 g/dL (ref 31.5–35.7)
MCV: 89 fL (ref 79–97)
Platelets: 376 10*3/uL (ref 150–379)
RBC: 5.66 x10E6/uL (ref 4.14–5.80)
RDW: 13.8 % (ref 12.3–15.4)
WBC: 12.1 10*3/uL — ABNORMAL HIGH (ref 3.4–10.8)

## 2016-10-12 LAB — BASIC METABOLIC PANEL
BUN/Creatinine Ratio: 16 (ref 9–20)
BUN: 14 mg/dL (ref 6–24)
CO2: 17 mmol/L — ABNORMAL LOW (ref 20–29)
Calcium: 9.6 mg/dL (ref 8.7–10.2)
Chloride: 103 mmol/L (ref 96–106)
Creatinine, Ser: 0.9 mg/dL (ref 0.76–1.27)
GFR calc Af Amer: 110 mL/min/{1.73_m2} (ref >59)
GFR calc non Af Amer: 95 mL/min/{1.73_m2} (ref >59)
Glucose: 89 mg/dL (ref 65–99)
Potassium: 4.7 mmol/L (ref 3.5–5.2)
Sodium: 142 mmol/L (ref 134–144)

## 2016-10-12 LAB — TSH: TSH: 2.33 u[IU]/mL (ref 0.450–4.500)

## 2016-10-12 NOTE — Patient Instructions (Signed)
Medication Instructions:  NO CHANGES Your physician recommends that you continue on your current medications as directed. Please refer to the Current Medication list given to you today. If you need a refill on your cardiac medications before your next appointment, please call your pharmacy.  Labwork: BMP, CBC AND TSH TODAY HERE IN OUR OFFICE AT [facility]  Testing/Procedures: Your physician has recommended that you wear an event monitor. Event monitors are medical devices that record the heart's electrical activity. Doctors most often [place] these monitors to diagnose arrhythmias. Arrhythmias are problems with the speed or rhythm of the heartbeat. The monitor is a small, portable device. You can wear one while you do your normal daily activities. This is usually used to diagnose what is causing palpitations/syncope (passing out).  Follow-Up: Your physician wants you to follow-up in: AFTER MONITOR WITH DR HARDING.   Thank you for choosing [facility] at [facility]!!

## 2016-10-12 NOTE — Progress Notes (Signed)
Cardiology Office Note   Date:  10/12/2016   ID:  Troy Parrish, DOB [birth year], MRN [id number]  PCP:  Jearld Fenton, NP  Cardiologist:  Dr. Ellyn Hack, 07/20/2016  Rosaria Ferries, PA-C   Chief Complaint  Patient presents with  . Loss of Consciousness    History of Present Illness: Troy Parrish is a 56 y.o. male with a history of PCI to LAD, CFX, RCA, RPL2, PDA, OM1 (last time was 05/2016 DES OM1), COPD, HTN, remote tob use but restarted recently, HLD, SZ, borderline DM.  07/20/2016 office visit, PCSK9 referral planned, euvolemic at 185 lbs, no CP, shortness of breath with Brilinta so on Effient 10/06/2016 phone note describing brief syncopal episode in the setting of emotional stress, appointment made, Dr. Ellyn Hack feels he may need a monitor.  Troy Parrish presents for cardiology follow up and evaluation.  He recently went hiking, unusual for him. He got out of breath, when he felt his exertion level was 10/10 (at one point), but did not feel the Shortness of breath was out of line with his exertion. He does not exercise, but plans to start going with his son.   He has been having dizzy spells recently. He has had presyncope with coughing. He will get dizzy when he turns his head quickly or changes position. That resolves in just a few seconds.   He was in an argument, started feeling a little sweaty and nauseated. He felt a pressure HA. Checked his blood pressure and it was 167/117. He removed himself from the situation, relaxed and laid down. He took his meds and was standing up talking to his friend. He woke up face down on the bed. He had no warning that he was going to pass out. When he woke up, he knew immediately who he was and where he was. There was no post ictal state. There is was no incontinence of bowel or bladder.  He checks his CBG on his (wife's) home machine, it is always normal. He no longer drinks soda and is watching what he eats more carefully. He feels that he drinks  plenty of water, gets well over 2 L a day. He does not limit salt at all. He has no problems with lower extremity edema, orthopnea or PND. There is no new dyspnea on exertion.   Past Medical History:  Diagnosis Date  . Bronchitis    Gets bronchitis almost every year  . CAD S/P percutaneous coronary angioplasty 06/2011; 6/ & 11/2013   S/P PCI to all 3 major vessels;a)  Ant STEMI 2001- BMS-LAD x2 -->(redo PCI 6/'15 -- pLAD Xience DES 2.5 x 12- 2.75 mm, mLAD 2.25 x 12 - 2.7 mm), b) '06 UA --> Cx- OM DES; c) 2013 Inf MI BMS mRCA --> d) 10/'15 PCI dRCA Promus P DES 4.0 x 16 (4.25 mm); PTCA of RPL2 (2.0 mm) &RPDA (2.25 mm) 11/2013; 4/18 PCI OM1 Synergy DES  2.5 x 16  . COPD (chronic obstructive pulmonary disease) ([place])   . Elevated WBC count   . Emphysema of lung ([place])   . Essential hypertension 07/07/2011  . Former heavy tobacco smoker     quit in May 2015 after multiple attempts at trying to quit before   . GERD (gastroesophageal reflux disease)   . Glucose intolerance (pre-diabetes) June 2015    hemoglobin A1c 6.6  . Headache    "Imdur related; stopped taking it; headaches went away" (11/15/2013)  . Heart murmur   . History  of colon polyps   . History of stomach ulcers   . Hyperlipidemia with target LDL less than 70 07/07/2011  . Metabolic syndrome    Pre-diabetes, hypertension and truncal obesity as well as dyslipidemia  . OSA on CPAP   . Pneumonia "several times"  . Recurrent upper respiratory infection (URI)   . Seizures ([place]) "several"   "last one was ~ 2011" (11/15/2013)  . Shortness of breath dyspnea   . ST elevation myocardial infarction (STEMI) of anterior wall ([place]) 2001   History of -- 2 stents in early and distal mid LAD; prior cardiologist was Dr. Remi Haggard in [place]  . ST elevation myocardial infarction (STEMI) of inferior wall ([place]) 07/07/2011   Occluded RCA 8.33A25 INTEGRITY; Echo 07/2013: EF 45-50%, Inferior & Posterolateral HK.   Marland Kitchen Umbilical hernia   . Unstable  angina ([place]) 2006; 07/2013   Cx-OM - PCI 2.5 mm 13 mm Cypher DES [name], [place]) ; 07/2013: Severe ISR of both prox & Distal LAD stentS --> 2 DES stents (1 at prox edge of the proximal stent, 2nd covers the entire distal stent as well as proximal and distal edge stenose)   . Upper respiratory infection     Past Surgical History:  Procedure Laterality Date  . Cardiac MRI [facility]  09/2014   EF 51%. Mod HK of basal-mid Inf wall, mild HK of basal inferoseptum & basal-mid inferolateral wall with hyper enhancement (c/w subendocard RCA MI with significant viability), focal hyper enhancement of apical wall c/w dLAD subendocard MI & complete LAD viability. . No aortic stenosis. Normal RV function. No Ischemia on Adenosine Stress.  . COLONOSCOPY WITH PROPOFOL N/A 03/28/2015   Procedure: COLONOSCOPY WITH PROPOFOL;  Surgeon: Lucilla Lame, MD;  Location: [place];  Service: Endoscopy;  Laterality: N/A;  . CORONARY ANGIOPLASTY WITH STENT PLACEMENT  2001   ANTERIOR mi with a  PCI to LAD; [place], [place] - Dr. Dorris Fetch  . CORONARY ANGIOPLASTY WITH STENT PLACEMENT  2006   [place] by Dr Dorris Fetch -lesion lft circ/OM1 with 2.5 x 42m Cypher DES  . CORONARY STENT INTERVENTION N/A 05/19/2016   Procedure: Coronary Stent Intervention;  Surgeon: DLeonie Man MD;  [facility];  Service: Cardiovascular: PCI pOM1 - Synergy DES 2.5 x 16 (overlaps old stent)  . ESOPHAGOGASTRODUODENOSCOPY (EGD) WITH PROPOFOL N/A 03/28/2015   Procedure: ESOPHAGOGASTRODUODENOSCOPY (EGD) WITH PROPOFOL;  Surgeon: DLucilla Lame MD;  [facility]  Service: Endoscopy;  Laterality: N/A;  . INSERTION OF MESH N/A 04/02/2015   Procedure: INSERTION OF MESH;  Surgeon: RFlorene Glen MD;  Location: [facility];  Service: General;  Laterality: N/A;  . LEFT AND RIGHT HEART CATHETERIZATION WITH CORONARY ANGIOGRAM N/A 07/30/2013   Procedure: LEFT AND RIGHT HEART CATHETERIZATION WITH CORONARY ANGIOGRAM;  Surgeon: DLeonie Man MD;  [facility];  Service: Cardiovascular;  2 separate P & mLAD lesions -> PCI, MOd-Severe dRCA disesase with diffuse RPL/PDA disease (FFR)  . LEFT HEART CATH  08/03/2013   Procedure: LEFT HEART CATH;  Surgeon: DLeonie Man MD;  [facility];  Service: Cardiovascular;;FFR of RCA - non-flow-limiting  . LEFT HEART CATH AND CORONARY ANGIOGRAPHY N/A 05/19/2016   Procedure: Left Heart Cath and Coronary Angiography;  Surgeon: DLeonie Man MD;  [facility];  Service: Cardiovascular: CULPRIT - 85% pOM1 pre-stent. Stents in both proximal and distal RCA widely patent. PTCA sites in RPL & PDA widely patent with stable 70%  small RPL 2. Extensive stenting in the LAD widely patent stents with mild to mod Dz btw prox & mid-distal stents.   Marland Kitchen LEFT HEART CATHETERIZATION WITH CORONARY ANGIOGRAM N/A 07/07/2011   Procedure: LEFT HEART CATHETERIZATION WITH CORONARY ANGIOGRAM;  Surgeon: Leonie Man, MD;  Location: [facility] LAB;  Service: Cardiovascular;  inferolat post LV infarct ST-elevation MI  . MET/CPET  11/09/2011   submax. effort 1.05 RER peak V02 was 51% ,chronotropic incomp.hrt rate lows 80s to 100  . MET/CPET  September 2016   [facility]: Mild functional impairment due primarily to mild pulmonary and circulatory limitations. Also suggest physical deconditioning (seen with low-normal aerobic reserve). Mild VQ mismatch with exercise.  Ventilatory reserve exhausted with peak exercise demonstrated pulmonary limitation. No significant decrease in postexercise FEV1 compared to rest --> suggest no exercise induced bronchospasm  . NM MYOVIEW ([place] HX)  11/30/2011   EF 45% ,exercise 10 METS, infarct\scar w mild perinfarct ischemia -basal inferolat and mid inferolat region  . NM MYOVIEW LTD  04/25/2016   Exercised for 9 minutes. Reached 90% max. Heart rate. 9.4 METS. Read as a low risk study with normal EF 55-65%. -> on Review -  there does appear to be a small to moderate  sized, medium severtiy reversible perfusion defect in the anterior wall - read by computer, but not noted by reader.  Marland Kitchen PERCUTANEOUS CORONARY STENT INTERVENTION (PCI-S)  07/07/2011   Procedure: PERCUTANEOUS CORONARY STENT INTERVENTION (PCI-S);  Surgeon: Leonie Man, MD;  Location: [facility] CATH LAB;  Service: Cardiovascular;;occluded RCA ,Integrity Resolute DES 2.75 x 18 mm stent - post-dilated 3.1 mm  . PERCUTANEOUS CORONARY STENT INTERVENTION (PCI-S)  07/30/2013   Procedure: PERCUTANEOUS CORONARY STENT INTERVENTION (PCI-S);  Surgeon: Leonie Man, MD;  Location: [facility] CATH LAB;  Service: Cardiovascular;;Distal mid LAD: Xience Alpine DES 2.25 mm x 28 mm (overlapping proximal and distal edge of previous stent) - 2.7 mm; proximal LAD 2.5 mm x 12 mm Xience Alpine DES (2.8 mm);; RCA 60-70% stenosis planned staged procedure  . PERCUTANEOUS CORONARY STENT INTERVENTION (PCI-S) N/A 11/15/2013   Procedure: PERCUTANEOUS CORONARY STENT INTERVENTION (PCI-S);  Surgeon: Leonie Man, MD;  Location: [facility] CATH LAB;  Service: Cardiovascular;  Patent Cx & LAD Stents; PCI -dRCA Promus Premier DES 4.0 mm x 16 mm (4.25 mm) dRCA, 2.0 mm Cutting PTCA of RPL2 Ostium & POBA of mRPDA  . POLYPECTOMY  03/28/2015   Procedure: POLYPECTOMY;  Surgeon: Lucilla Lame, MD;  Location: [place];  Service: Endoscopy;;  . RIGHT HEART CATH  June 2015   Normal pressures with severely reduced cardiac output and index. (3.25/1.55)  . TRANSTHORACIC ECHOCARDIOGRAM  07/31/2013; February 2017   a. LVEF 45-50. Inferior posterior hypokinesis with mild dilation. Apparent normal diastolic pressures;   . UMBILICAL HERNIA REPAIR N/A 04/02/2015   Procedure: HERNIA REPAIR UMBILICAL ADULT;  Surgeon: Florene Glen, MD;  Location: [facility];  Service: General;  Laterality: N/A;    Current Outpatient Prescriptions  Medication Sig Dispense Refill  . amLODipine (NORVASC) 2.5 MG tablet Take 1 tablet by mouth every evening.    Marland Kitchen aspirin 81 MG EC tablet  Take 1 tablet (81 mg total) by mouth at bedtime. 30 tablet   . clobetasol cream (TEMOVATE) 4.74 % Apply 1 application topically daily as needed (eczema).    . metoprolol succinate (TOPROL-XL) 25 MG 24 hr tablet Take 25 mg by mouth every evening.    . nitroGLYCERIN (NITROSTAT) 0.4 MG SL tablet Place 1 tablet (0.4  mg total) under the tongue every 5 (five) minutes as needed for chest pain. 25 tablet 3  . omeprazole (PRILOSEC) 20 MG capsule Take 20 mg by mouth every evening.    . prasugrel (EFFIENT) 10 MG TABS tablet Take 10 mg by mouth every evening.    Marland Kitchen PROAIR HFA 108 (90 Base) MCG/ACT inhaler INHALE 1 PUFF INTO THE LUNGS EVERY 6 (SIX) HOURS AS NEEDED. 8.5 each 4  . rosuvastatin (CRESTOR) 40 MG tablet Take 1 tablet (40 mg total) by mouth daily. 90 tablet 3   No current facility-administered medications for this visit.     Allergies:   Niacin and related    Social History:  The patient  reports that he has been smoking Cigarettes.  He has a 20.00 pack-year smoking history. He has never used smokeless tobacco. He reports that he drinks about 0.6 oz of alcohol per week . He reports that he does not use drugs.   Family History:  The patient's family history includes Cancer in his maternal aunt; Coronary artery disease in his father; Diabetes in his maternal grandfather, maternal grandmother, and mother; Emphysema in his maternal grandfather; Heart disease in his father, paternal grandfather, and paternal grandmother; Hyperlipidemia in his maternal grandfather, maternal grandmother, and mother; Hypertension in his father; Stroke in his father.    ROS:  Please see the history of present illness. All other systems are reviewed and negative.   Orthostatic Vital Signs                               Blood Pressure                 HeartRate Lying  125/85  77   Sitting  109/76  79   Standing  107/76  82   Standing 3 minutes  101/71  83     PHYSICAL EXAM: VS:  BP 122/87 (Cuff Size: Normal)   Pulse 83    Ht 6' (1.829 m)   Wt 182 lb (82.6 kg)   BMI 24.68 kg/m  , BMI Body mass index is 24.68 kg/m. GEN: Well nourished, well developed, male in no acute distress  HEENT: normal for age  Neck: no JVD, no carotid bruit, no masses Cardiac: RRR; soft murmur, no rubs, or gallops Respiratory:  clear to auscultation bilaterally, normal work of breathing GI: soft, nontender, nondistended, + BS MS: no deformity or atrophy; no edema; distal pulses are 2+ in all 4 extremities   Skin: warm and dry, no rash Neuro:  Strength and sensation are intact Psych: euthymic mood, full affect   EKG:  EKG is not ordered today.  CARDIAC cath: 05/19/2016  1st Mrg-2 lesion, 85 %stenosed. Just proximal to previous stent (Cypher DES 2.5 mm x 13 mm)  A STENT SYNERGY DES 2.5X16 drug eluting stent was successfully placed, and overlaps previously placed stent.  Post intervention, there is a 0% residual stenosis.  ____Remaining Stents & PTCA Sites Patent______  Prox RCA Resolute DES 3.75 mm x 18 mm (4.0 mm), 0 %stenosed.  Dist RCA Promus DES 4.0 mm x 16 mm (4.25 mm), 0 %stenosed.  2nd RPLB lesion, 70 %stenosed. 2 small for PTCA  RPDA lesion, 30 %stenosed. Previous PTCA site  Post Atrio lesion, 10 %stenosed. Previous PTCA site  Prox LAD to Mid LAD BMS overlapped with Xience DES 2.59m x 162m(2.37m41m 0 %stenosed.  Dist LAD BMS covered with a Xience DES 2.25  mm x 28 mm (2.7 mm), 0 %stenosed.  Mid LAD to Dist LAD lesion, 45 %stenosed - in the intervening segment between the stented segments  There is mild left ventricular systolic dysfunction.  LV end diastolic pressure is normal.  The left ventricular ejection fraction is 45-50% by visual estimate.  Culprit lesion goes along with the inferolateral ischemia last saw on the stress test. 85% pre-stent stenosis in the OM1 treated with overlapping DES stent. Other stents and PTCA sites widely patent. Stable mildly reduced LVEF with normal LVEDP Plan:  Standard post radial band removal  Stable PCI. Candidate for same-day discharge  Continue home dose of aspirin and Effient  Continue home dose of Toprol, and if blood pressure will tolerate, can restart amlodipine 2.5 gram.  He will follow-up in clinic with either myself or APP next week.   ECHO: 03/26/2015 - Left ventricle: posterior lateral hypokinesis The cavity size was   mildly dilated. Systolic function was normal. The estimated   ejection fraction was in the range of 50% to 55%. Wall motion was   normal; there were no regional wall motion abnormalities. Doppler   parameters are consistent with abnormal left ventricular   relaxation (grade 1 diastolic dysfunction). - Atrial septum: No defect or patent foramen ovale was identified.   Recent Labs: 03/30/2016: ALT 26 05/13/2016: BUN 17; Creat 1.01; Hemoglobin 16.9; Platelets 389; Potassium 5.3; Sodium 139    Lipid Panel    Component Value Date/Time   CHOL 149 09/03/2016 0950   TRIG 243 (H) 09/03/2016 0950   HDL 34 (L) 09/03/2016 0950   CHOLHDL 4.4 09/03/2016 0950   CHOLHDL 5 03/30/2016 1424   VLDL 35.4 03/30/2016 1424   LDLCALC 66 09/03/2016 0950   LDLDIRECT 109.0 09/03/2015 1622     Wt Readings from Last 3 Encounters:  10/12/16 182 lb (82.6 kg)  07/20/16 185 lb 3.2 oz (84 kg)  05/26/16 186 lb 12.8 oz (84.7 kg)     Other studies Reviewed: Additional studies/ records that were reviewed today include: Office notes, hospital records and testing.  ASSESSMENT AND PLAN:  1.  Syncope: Orthostatic vital signs were positive, but he has good PO intake, both with liquids and sodium. He had no palpitations, no feeling that anything was going to happen.The symptoms are concerning for cardiac origin.   He will wear an event monitor for 30 days. He will pick this up after he gets back from his daughter's wedding. He will stop the amlodipine to allow his blood pressure to float a little higher. We will check a CBC, a BMET, and a  TSH today. If he has additional symptoms, he is to call us. Otherwise, follow-up with Dr. Ellyn Hack.  2. Hypertension: His blood pressure is normal today, but to keep it from dropping too low at times, it may need to be a little higher.  3. CAD: He is not having any ischemic symptoms. Continue aspirin, beta blocker, sublingual nitroglycerin when necessary, Effient and statin. It is okay to exert himself but he is encouraged not to overexert again.  4. Ischemic cardiomyopathy: His EF was 45-50 percent at cath in April 2018. He is not currently on ACE or ARB, but his potassium level was 5.3 prior to the cath. We'll not start one at this time but if he needs additional blood pressure control, ACE/ARB might be a Consideration.  5. Hyperlipidemia: He had lipids checked 09/03/2016. That time, his total cholesterol was 149 and LDL 66. His HDL was still slightly  low at 34, he is encouraged to start regular exercise. Continue Crestor, PCS K9 not indicated at this time.  Current medicines are reviewed at length with the patient today.  The patient does not have concerns regarding medicines.  The following changes have been made:  Discontinue amlodipine, patient was contacted by phone after he left and advised to do this.  Labs/ tests ordered today include:   Orders Placed This Encounter  Procedures  . Basic metabolic panel  . CBC  . TSH  . Cardiac event monitor     Disposition:   FU with Dr. Ellyn Hack  Signed, Saniah Schroeter, Suanne Marker, PA-C  10/12/2016 1:14 PM    [place] Phone: [phone]; Fax: [phone]  This note was written with the assistance of speech recognition software. Please excuse any transcriptional errors.

## 2016-10-22 ENCOUNTER — Telehealth: Payer: Self-pay | Admitting: Physician Assistant

## 2016-10-22 NOTE — Telephone Encounter (Signed)
New message ° ° °Pt is calling to get test results °

## 2016-10-22 NOTE — Telephone Encounter (Signed)
Lab Result given to patient. Patient question whether to continue to hold amlodipine dose as instructed by Suanne Marker.  RN informed patient instruction not given about any medication changes. Will defer to [facility] and contact him back. Informed patient to continue with current instruction and hold amlodipine. Verbalized understanding.

## 2016-10-22 NOTE — Telephone Encounter (Signed)
Information given. patient voiced understanding.  blood pressure should be less than 140/90.

## 2016-10-22 NOTE — Telephone Encounter (Signed)
If his blood pressure is okay, continue to hold the amlodipine  Thanks

## 2016-11-10 ENCOUNTER — Ambulatory Visit (INDEPENDENT_AMBULATORY_CARE_PROVIDER_SITE_OTHER): Payer: PPO

## 2016-11-10 DIAGNOSIS — Z23 Encounter for immunization: Secondary | ICD-10-CM | POA: Diagnosis not present

## 2016-11-25 ENCOUNTER — Ambulatory Visit (INDEPENDENT_AMBULATORY_CARE_PROVIDER_SITE_OTHER): Payer: PPO

## 2016-11-25 DIAGNOSIS — R55 Syncope and collapse: Secondary | ICD-10-CM

## 2016-12-10 IMAGING — DX DG CHEST 2V
2 series · 2 of 2 positions shown · non-contrast
Comparison: 07/17/2014

CLINICAL DATA: Hypertension.  COPD.  Coronaries disease.

EXAM:
CHEST  2 VIEW

[chest pa]
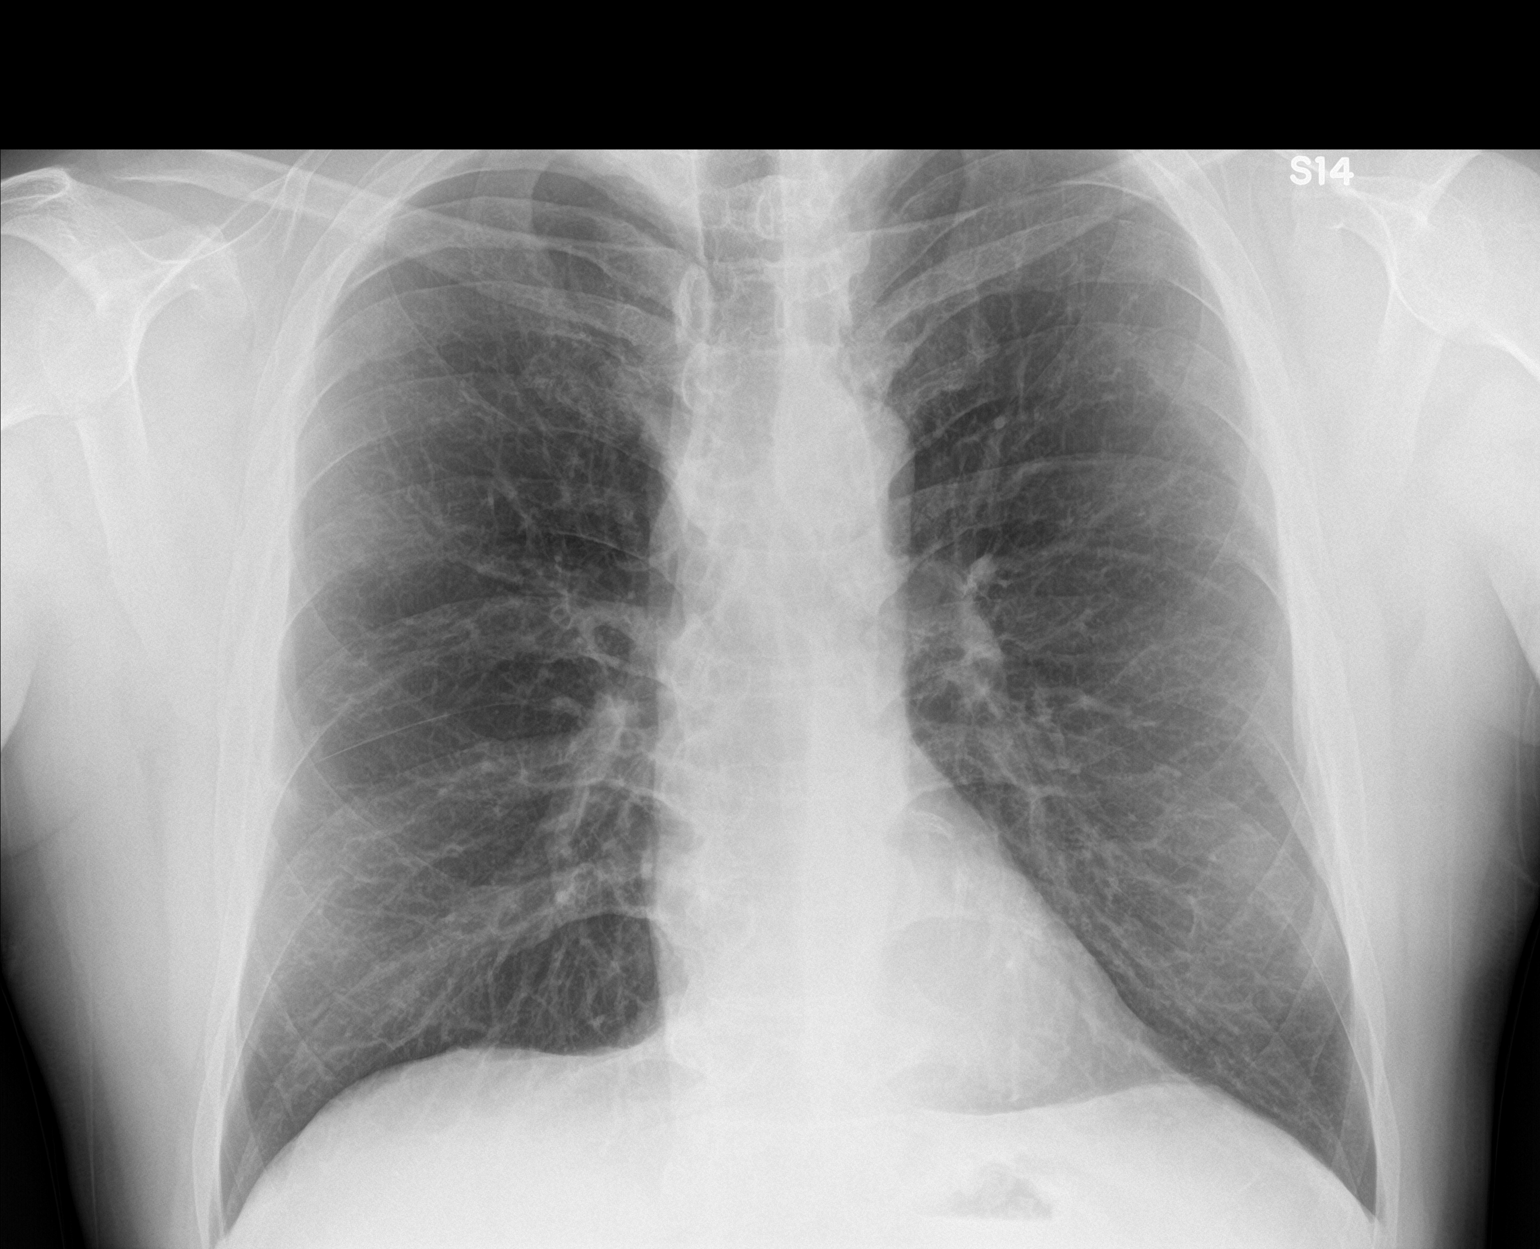

[chest lat]
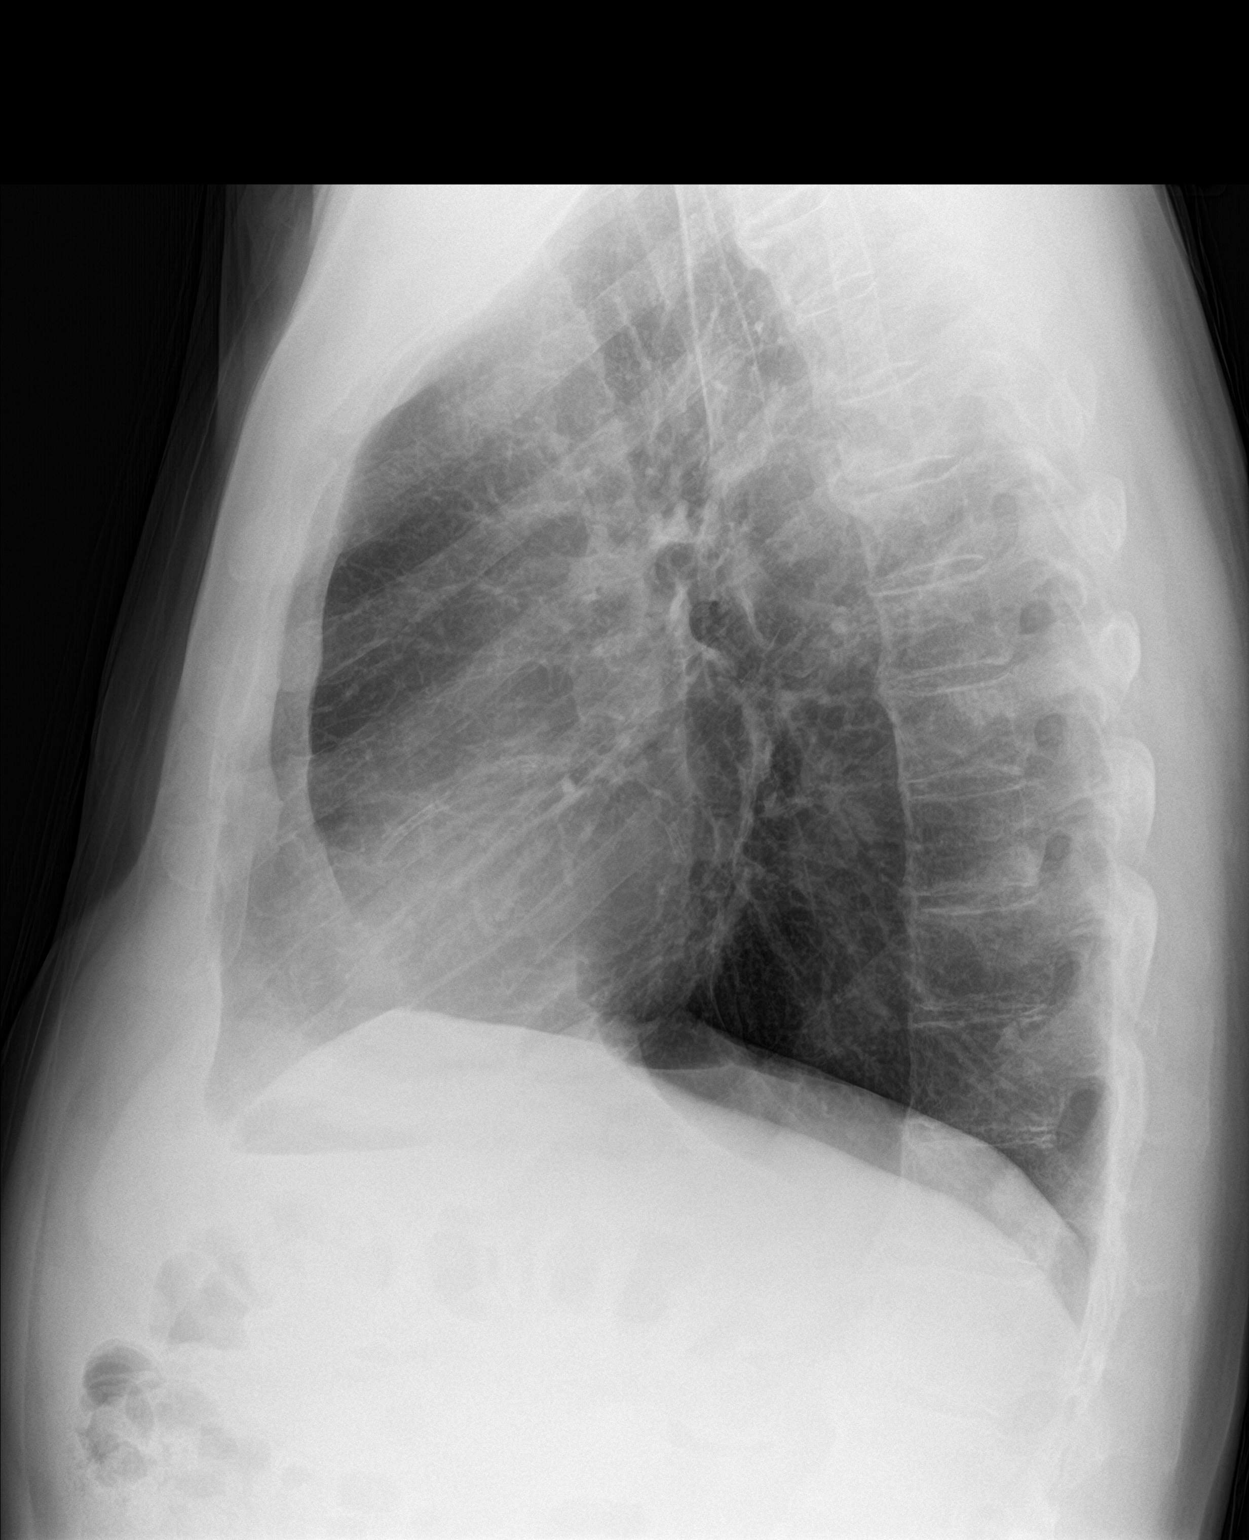

[2 of 2 positions shown; findings below may reference images not displayed]

FINDINGS: Tapering of the peripheral pulmonary vasculature favors emphysema.
Coronary artery stents noted.

Heart size within normal limits. Mediastinal contours normal.
Similar appearance of thoracic spondylosis to prior exams.
IMPRESSION: 1. Emphysema.  No acute findings.
2. Thoracic spondylosis.

## 2016-12-21 DIAGNOSIS — M9902 Segmental and somatic dysfunction of thoracic region: Secondary | ICD-10-CM | POA: Diagnosis not present

## 2016-12-21 DIAGNOSIS — M62830 Muscle spasm of back: Secondary | ICD-10-CM | POA: Diagnosis not present

## 2016-12-21 DIAGNOSIS — M5134 Other intervertebral disc degeneration, thoracic region: Secondary | ICD-10-CM | POA: Diagnosis not present

## 2016-12-21 DIAGNOSIS — M9901 Segmental and somatic dysfunction of cervical region: Secondary | ICD-10-CM | POA: Diagnosis not present

## 2016-12-22 DIAGNOSIS — M9901 Segmental and somatic dysfunction of cervical region: Secondary | ICD-10-CM | POA: Diagnosis not present

## 2016-12-22 DIAGNOSIS — M62830 Muscle spasm of back: Secondary | ICD-10-CM | POA: Diagnosis not present

## 2016-12-22 DIAGNOSIS — M9902 Segmental and somatic dysfunction of thoracic region: Secondary | ICD-10-CM | POA: Diagnosis not present

## 2016-12-22 DIAGNOSIS — M5134 Other intervertebral disc degeneration, thoracic region: Secondary | ICD-10-CM | POA: Diagnosis not present

## 2016-12-23 DIAGNOSIS — M9902 Segmental and somatic dysfunction of thoracic region: Secondary | ICD-10-CM | POA: Diagnosis not present

## 2016-12-23 DIAGNOSIS — M9901 Segmental and somatic dysfunction of cervical region: Secondary | ICD-10-CM | POA: Diagnosis not present

## 2016-12-23 DIAGNOSIS — M62830 Muscle spasm of back: Secondary | ICD-10-CM | POA: Diagnosis not present

## 2016-12-23 DIAGNOSIS — M5134 Other intervertebral disc degeneration, thoracic region: Secondary | ICD-10-CM | POA: Diagnosis not present

## 2016-12-27 DIAGNOSIS — M5134 Other intervertebral disc degeneration, thoracic region: Secondary | ICD-10-CM | POA: Diagnosis not present

## 2016-12-27 DIAGNOSIS — M9901 Segmental and somatic dysfunction of cervical region: Secondary | ICD-10-CM | POA: Diagnosis not present

## 2016-12-27 DIAGNOSIS — M9902 Segmental and somatic dysfunction of thoracic region: Secondary | ICD-10-CM | POA: Diagnosis not present

## 2016-12-27 DIAGNOSIS — M62830 Muscle spasm of back: Secondary | ICD-10-CM | POA: Diagnosis not present

## 2017-01-03 DIAGNOSIS — M9902 Segmental and somatic dysfunction of thoracic region: Secondary | ICD-10-CM | POA: Diagnosis not present

## 2017-01-03 DIAGNOSIS — M62830 Muscle spasm of back: Secondary | ICD-10-CM | POA: Diagnosis not present

## 2017-01-03 DIAGNOSIS — M9901 Segmental and somatic dysfunction of cervical region: Secondary | ICD-10-CM | POA: Diagnosis not present

## 2017-01-03 DIAGNOSIS — M5134 Other intervertebral disc degeneration, thoracic region: Secondary | ICD-10-CM | POA: Diagnosis not present

## 2017-01-05 ENCOUNTER — Ambulatory Visit: Payer: PPO | Admitting: Cardiology

## 2017-01-05 ENCOUNTER — Encounter: Payer: Self-pay | Admitting: Cardiology

## 2017-01-05 VITALS — BP 126/90 | HR 80 | Ht 72.0 in | Wt 188.0 lb

## 2017-01-05 DIAGNOSIS — I5189 Other ill-defined heart diseases: Secondary | ICD-10-CM | POA: Diagnosis not present

## 2017-01-05 DIAGNOSIS — Z9861 Coronary angioplasty status: Secondary | ICD-10-CM

## 2017-01-05 DIAGNOSIS — I10 Essential (primary) hypertension: Secondary | ICD-10-CM | POA: Diagnosis not present

## 2017-01-05 DIAGNOSIS — R42 Dizziness and giddiness: Secondary | ICD-10-CM

## 2017-01-05 DIAGNOSIS — M62830 Muscle spasm of back: Secondary | ICD-10-CM | POA: Diagnosis not present

## 2017-01-05 DIAGNOSIS — I255 Ischemic cardiomyopathy: Secondary | ICD-10-CM

## 2017-01-05 DIAGNOSIS — E785 Hyperlipidemia, unspecified: Secondary | ICD-10-CM

## 2017-01-05 DIAGNOSIS — J439 Emphysema, unspecified: Secondary | ICD-10-CM | POA: Diagnosis not present

## 2017-01-05 DIAGNOSIS — M9902 Segmental and somatic dysfunction of thoracic region: Secondary | ICD-10-CM | POA: Diagnosis not present

## 2017-01-05 DIAGNOSIS — I209 Angina pectoris, unspecified: Secondary | ICD-10-CM

## 2017-01-05 DIAGNOSIS — M9901 Segmental and somatic dysfunction of cervical region: Secondary | ICD-10-CM | POA: Diagnosis not present

## 2017-01-05 DIAGNOSIS — M5134 Other intervertebral disc degeneration, thoracic region: Secondary | ICD-10-CM | POA: Diagnosis not present

## 2017-01-05 DIAGNOSIS — I2510 Atherosclerotic heart disease of native coronary artery without angina pectoris: Secondary | ICD-10-CM

## 2017-01-05 MED ORDER — MECLIZINE HCL 12.5 MG PO TABS: 12.5000 mg | ORAL_TABLET | Freq: Every day | ORAL | Status: DC

## 2017-01-05 NOTE — Patient Instructions (Addendum)
Medication Instructions:  Your physician has recommended you make the following change in your medication:  1) STOP Amlodipine 2) START Meclizine 12.5mg  tablet DAILY by mouth with Lunch; may take additional 12.5mg  tablet as needed for dizziness   Labwork: none  Testing/Procedures: Your physician has requested that you have a carotid duplex. This test is an ultrasound of the carotid arteries in your neck. It looks at blood flow through these arteries that supply the brain with blood. Allow one hour for this exam. There are no restrictions or special instructions.   Follow-Up: Your physician recommends that you schedule a follow-up appointment in: 4 months with Dr. Ellyn Hack.   Any Other Special Instructions Will Be Listed Below (If Applicable).  Dr. Ellyn Hack recommends that you speak with your pulmonologist about starting a long acting inhaler to decrease your need for rescue inhaler.   If you need a refill on your cardiac medications before your next appointment, please call your pharmacy.

## 2017-01-05 NOTE — Progress Notes (Signed)
PCP: Jearld Fenton, NP  Clinic Note: Chief Complaint  Patient presents with  . Follow-up    2  months -dizziness, shortness of breath and headache.  Near syncope  . Coronary Artery Disease  . Hyperlipidemia   HPI: Troy Parrish is a 56 y.o. male with a PMH below who presents today for 2-84-monthfollow-up after being seen for an episode of syncope.  Completed wearing his monitor.  He has a very significant multivessel coronary disease history with at least 3 STEMI's and at least 3 if not 4 episodes of progressive angina if not unstable angina leading to additional PCI. He also has very difficult/poorly controlled lipids. He intermittently goes back to smoking, but is now in the process of trying to complete his quitting.  He was seen by RRosaria Ferries PA-C on October 12, 2016 after an episode of syncope during episode of emotional stress.  He was noticing some dizzy spells associated with near syncope.  Dizzy with changing head position.  Episode of syncope occurred while in an argument.  His blood pressure was significantly elevated.  Since then shortly after passed out while standing up.  He had orthostatic positive vital signs at that time.  Recent Hospitalizations: He had a same-day discharge after his last PCI.  Studies Personally Reviewed - (if available, images/films reviewed: From Epic Chart or Care Everywhere)  30 d Event Monitor Oct: Mostly sinus rhythm with sinus tachycardia.  Rates range from 60-144 bpm.  Sinus arrhythmia noted.  Artifact but no arrhythmia noted.  Symptoms noted with sinus tachycardia that usually occurs in the afternoon/evenings  Interval History: RJohathonpresents today really feeling better.  He has not had any further syncope episodes or near syncope.  No significant rapid irregular heartbeats.  He did not notice anything during his monitor and was not surprised to find that there was not any abnormalities.  What he was noticing is that he has these dizzy  spells associated with coughing, but the prolonged episodes are no longer is significant.  He also notes some positional dizziness.  But usually these are related to his cough.  Only rarely does note some orthopnea or PND symptoms but usually is more Sitting up to catch his breath but not necessarily true PND. The episode that occurred was after a prolonged time of stress being near his family at his daughter's wedding.  He had just had an argument and was emotionally exhausted and thinks that that may have been part of the problem. He tells me though that he has been having these coughing fits and having a hard time with his breathing.  Has been try to use his inhaler some but whenever he uses his inhalers when he feels his heart rate going fast.  He feels dizzy and lightheaded after using the inhaler.  He says that a lot of this is been triggered by seasonal allergies He has been off and on smoker, is now down to half pack a day and really wants to quit.  He is opened up to finally make the final jump to quitting. Thankfully, with the exception of the dizziness, he is not having any further chest tightness or pressure at rest or exertion.  No anginal equivalent symptoms.  No CHF symptoms of edema or orthopnea.  He is able to walk now without E.  He just does note that he gets a little more fatigued than he had in the distant past, but is doing overall much better. Overall his  energy level does seem to be somewhat better.  He denies any arthralgias or myalgias from statin. No TIA or amaurosis fugax symptoms, syncope or near syncope.  No claudication or bleeding issues.  ROS: A comprehensive was performed. Review of Systems  Constitutional: Negative for malaise/fatigue (Energy is better).  HENT: Positive for congestion. Negative for nosebleeds.   Respiratory: Positive for cough and wheezing. Negative for shortness of breath.   Gastrointestinal: Negative for blood in stool, constipation and heartburn.   Genitourinary: Negative for dysuria and frequency.  Musculoskeletal: Negative for back pain, falls, joint pain and myalgias.  Skin: Negative.   Neurological: Positive for dizziness.  Endo/Heme/Allergies: Positive for environmental allergies. Does not bruise/bleed easily (But not overly worrisome).  Psychiatric/Behavioral: Negative for depression and memory loss. The patient is not nervous/anxious.        Better now, but was extremely stressed around the time of his daughter's wedding.  Being around his family is very stressful.  All other systems reviewed and are negative.  I have reviewed and (if needed) personally updated the patient's problem list, medications, allergies, past medical and surgical history, social and family history.   Past Medical History:  Diagnosis Date  . Bronchitis    Gets bronchitis almost every year  . CAD S/P percutaneous coronary angioplasty 06/2011; 6/ & 11/2013   S/P PCI to all 3 major vessels;a)  Ant STEMI 2001- BMS-LAD x2 -->(redo PCI 6/'15 -- pLAD Xience DES 2.5 x 12- 2.75 mm, mLAD 2.25 x 12 - 2.7 mm), b) '06 UA --> Cx- OM DES; c) 2013 Inf MI BMS mRCA --> d) 10/'15 PCI dRCA Promus P DES 4.0 x 16 (4.25 mm); PTCA of RPL2 (2.0 mm) &RPDA (2.25 mm) 11/2013; 4/18 PCI OM1 Synergy DES  2.5 x 16  . COPD (chronic obstructive pulmonary disease) ([place])   . Elevated WBC count   . Emphysema of lung ([place])   . Essential hypertension 07/07/2011  . Former heavy tobacco smoker     quit in May 2015 after multiple attempts at trying to quit before   . GERD (gastroesophageal reflux disease)   . Glucose intolerance (pre-diabetes) June 2015    hemoglobin A1c 6.6  . Headache    "Imdur related; stopped taking it; headaches went away" (11/15/2013)  . Heart murmur   . History of colon polyps   . History of stomach ulcers   . Hyperlipidemia with target LDL less than 70 07/07/2011  . Metabolic syndrome    Pre-diabetes, hypertension and truncal obesity as well as dyslipidemia  . OSA  on CPAP   . Pneumonia "several times"  . Recurrent upper respiratory infection (URI)   . Seizures ([place]) "several"   "last one was ~ 2011" (11/15/2013)  . Shortness of breath dyspnea   . ST elevation myocardial infarction (STEMI) of anterior wall ([place]) 2001   History of -- 2 stents in early and distal mid LAD; prior cardiologist was Dr. Remi Haggard in [place]  . ST elevation myocardial infarction (STEMI) of inferior wall ([place]) 07/07/2011   Occluded RCA 3.54S56 INTEGRITY; Echo 07/2013: EF 45-50%, Inferior & Posterolateral HK.   Marland Kitchen Umbilical hernia   . Unstable angina ([place]) 2006; 07/2013   Cx-OM - PCI 2.5 mm 13 mm Cypher DES [name], [place]) ; 07/2013: Severe ISR of both prox & Distal LAD stentS --> 2 DES stents (1 at prox edge of the proximal stent, 2nd covers the entire distal stent as well as proximal and distal edge stenose)   .  Upper respiratory infection     Past Surgical History:  Procedure Laterality Date  . Cardiac MRI [facility]  09/2014   EF 51%. Mod HK of basal-mid Inf wall, mild HK of basal inferoseptum & basal-mid inferolateral wall with hyper enhancement (c/w subendocard RCA MI with significant viability), focal hyper enhancement of apical wall c/w dLAD subendocard MI & complete LAD viability. . No aortic stenosis. Normal RV function. No Ischemia on Adenosine Stress.  . COLONOSCOPY WITH PROPOFOL N/A 03/28/2015   Procedure: COLONOSCOPY WITH PROPOFOL;  Surgeon: Lucilla Lame, MD;  Location: [facility];  Service: Endoscopy;  Laterality: N/A;  . CORONARY ANGIOPLASTY WITH STENT PLACEMENT  2001   ANTERIOR mi with a  PCI to LAD; [place], [place] - Dr. Dorris Fetch  . CORONARY ANGIOPLASTY WITH STENT PLACEMENT  2006   [place] by Dr Dorris Fetch -lesion lft circ/OM1 with 2.5 x 10m Cypher DES  . CORONARY STENT INTERVENTION N/A 05/19/2016   Procedure: Coronary Stent Intervention;  Surgeon: DLeonie Man MD;  [facility];  Service: Cardiovascular: PCI pOM1 - Synergy DES 2.5 x 16  (overlaps old stent)  . ESOPHAGOGASTRODUODENOSCOPY (EGD) WITH PROPOFOL N/A 03/28/2015   Procedure: ESOPHAGOGASTRODUODENOSCOPY (EGD) WITH PROPOFOL;  Surgeon: DLucilla Lame MD;  [place] [facility] [place]  Service: Endoscopy;  Laterality: N/A;  . INSERTION OF MESH N/A 04/02/2015   Procedure: INSERTION OF MESH;  Surgeon: RFlorene Glen MD;  Location: [facility];  Service: General;  Laterality: N/A;  . LEFT AND RIGHT HEART CATHETERIZATION WITH CORONARY ANGIOGRAM N/A 07/30/2013   Procedure: LEFT AND RIGHT HEART CATHETERIZATION WITH CORONARY ANGIOGRAM;  Surgeon: DLeonie Man MD;  [facility];  Service: Cardiovascular;  2 separate P & mLAD lesions -> PCI, MOd-Severe dRCA disesase with diffuse RPL/PDA disease (FFR)  . LEFT HEART CATH  08/03/2013   Procedure: LEFT HEART CATH;  Surgeon: DLeonie Man MD;  [facility];  Service: Cardiovascular;;FFR of RCA - non-flow-limiting  . LEFT HEART CATH AND CORONARY ANGIOGRAPHY N/A 05/19/2016   Procedure: Left Heart Cath and Coronary Angiography;  Surgeon: DLeonie Man MD;  [facility];  Service: Cardiovascular: CULPRIT - 85% pOM1 pre-stent. Stents in both proximal and distal RCA widely patent. PTCA sites in RPL & PDA widely patent with stable 70% small RPL 2. Extensive stenting in the LAD widely patent stents with mild to mod Dz btw prox & mid-distal stents.   .Marland KitchenLEFT HEART CATHETERIZATION WITH CORONARY ANGIOGRAM N/A 07/07/2011   Procedure: LEFT HEART CATHETERIZATION WITH CORONARY ANGIOGRAM;  Surgeon: DLeonie Man MD;  [facility];  Service: Cardiovascular;  inferolat post LV infarct ST-elevation MI  . MET/CPET  11/09/2011   submax. effort 1.05 RER peak V02 was 51% ,chronotropic incomp.hrt rate lows 80s to 100  . MET/CPET  September 2016   [facility]: Mild functional impairment due primarily to mild pulmonary and circulatory limitations. Also suggest physical deconditioning (seen with low-normal aerobic reserve). Mild  VQ mismatch with exercise.  Ventilatory reserve exhausted with peak exercise demonstrated pulmonary limitation. No significant decrease in postexercise FEV1 compared to rest --> suggest no exercise induced bronchospasm  . [place]  11/30/2011   EF 45% ,exercise 10 METS, infarct\scar w mild perinfarct ischemia -basal inferolat and mid inferolat region  . NM MYOVIEW LTD  04/25/2016   Exercised for 9 minutes. Reached 90% max. Heart rate. 9.4 METS. Read as a low risk study with normal EF 55-65%. -> on  Review -  there does appear to be a small to moderate sized, medium severtiy reversible perfusion defect in the anterior wall - read by computer, but not noted by reader.  Marland Kitchen PERCUTANEOUS CORONARY STENT INTERVENTION (PCI-S)  07/07/2011   Procedure: PERCUTANEOUS CORONARY STENT INTERVENTION (PCI-S);  Surgeon: Leonie Man, MD;  [facility];  Service: Cardiovascular;;occluded RCA ,Integrity Resolute DES 2.75 x 18 mm stent - post-dilated 3.1 mm  . PERCUTANEOUS CORONARY STENT INTERVENTION (PCI-S)  07/30/2013   Procedure: PERCUTANEOUS CORONARY STENT INTERVENTION (PCI-S);  Surgeon: Leonie Man, MD;  Location: [facility] CATH LAB;  Service: Cardiovascular;;Distal mid LAD: Xience Alpine DES 2.25 mm x 28 mm (overlapping proximal and distal edge of previous stent) - 2.7 mm; proximal LAD 2.5 mm x 12 mm Xience Alpine DES (2.8 mm);; RCA 60-70% stenosis planned staged procedure  . PERCUTANEOUS CORONARY STENT INTERVENTION (PCI-S) N/A 11/15/2013   Procedure: PERCUTANEOUS CORONARY STENT INTERVENTION (PCI-S);  Surgeon: Leonie Man, MD;  Location: [facility] CATH LAB;  Service: Cardiovascular;  Patent Cx & LAD Stents; PCI -dRCA Promus Premier DES 4.0 mm x 16 mm (4.25 mm) dRCA, 2.0 mm Cutting PTCA of RPL2 [place] & POBA of mRPDA  . POLYPECTOMY  03/28/2015   Procedure: POLYPECTOMY;  Surgeon: Lucilla Lame, MD;  Location: [facility];  Service: Endoscopy;;  . RIGHT HEART CATH  June 2015   Normal pressures with  severely reduced cardiac output and index. (3.25/1.55)  . TRANSTHORACIC ECHOCARDIOGRAM  07/31/2013; February 2017   a. LVEF 45-50. Inferior posterior hypokinesis with mild dilation. Apparent normal diastolic pressures;   . UMBILICAL HERNIA REPAIR N/A 04/02/2015   Procedure: HERNIA REPAIR UMBILICAL ADULT;  Surgeon: Florene Glen, MD;  Location: [facility];  Service: General;  Laterality: N/A;   Diagnostic Diagram  05/2016       Post-Intervention Diagram          OM1 PCI: SYNERGY DES 2.5X16 -overlaps OLD stent.         Current Meds  Medication Sig  . aspirin 81 MG EC tablet Take 1 tablet (81 mg total) by mouth at bedtime.  . clobetasol cream (TEMOVATE) 0.24 % Apply 1 application topically daily as needed (eczema).  . metoprolol succinate (TOPROL-XL) 25 MG 24 hr tablet Take 25 mg by mouth every evening.  . nitroGLYCERIN (NITROSTAT) 0.4 MG SL tablet Place 1 tablet (0.4 mg total) under the tongue every 5 (five) minutes as needed for chest pain.  Marland Kitchen omeprazole (PRILOSEC) 20 MG capsule Take 20 mg by mouth every evening.  . prasugrel (EFFIENT) 10 MG TABS tablet Take 10 mg by mouth every evening.  Marland Kitchen PROAIR HFA 108 (90 Base) MCG/ACT inhaler INHALE 1 PUFF INTO THE LUNGS EVERY 6 (SIX) HOURS AS NEEDED.  Marland Kitchen rosuvastatin (CRESTOR) 40 MG tablet Take 1 tablet (40 mg total) by mouth daily.  . [DISCONTINUED] amLODipine (NORVASC) 2.5 MG tablet Take 1 tablet by mouth every evening.   Current Facility-Administered Medications for the 01/05/17 encounter (Office Visit) with Leonie Man, MD  Medication  . meclizine (ANTIVERT) tablet 12.5 mg  -- not taking Amlodipine.    Allergies  Allergen Reactions  . Niacin And Related Hives and Itching   Social History   Socioeconomic History  . Marital status: Married    Spouse name: None  . Number of children: None  . Years of education: None  . Highest education level: None  Social Needs  . Financial resource strain: None  . Food insecurity - worry:  None    . Food insecurity - inability: None  . Transportation needs - medical: None  . Transportation needs - non-medical: None  Occupational History  . None  Tobacco Use  . Smoking status: Current Every Day Smoker    Packs/day: 0.50    Years: 40.00    Pack years: 20.00    Types: Cigarettes    Last attempt to quit: 06/19/2013    Years since quitting: 3.5  . Smokeless tobacco: Never Used  . Tobacco comment: Quit with Bupropion '150mg'$  - > unfortunately, he restarted  Substance and Sexual Activity  . Alcohol use: Yes    Alcohol/week: 0.6 oz    Types: 1 Cans of beer per week    Comment: rare  . Drug use: No  . Sexual activity: Yes  Other Topics Concern  . None  Social History Narrative   He is a married father of 98, grandfather of 55. He does not really get routine exercise.    He is down to 5 or 6 cigarettes a day. He is very seriously wanting to quit. This is a significant cutback for him, but he has pretty much been told he needs to stop by his wife, and so he fully intends to do so. He is willing to try the patches or whatever. He has a social alcoholic beverage every now and then.    family history includes Cancer in his maternal aunt; Coronary artery disease in his father; Diabetes in his maternal grandfather, maternal grandmother, and mother; Emphysema in his maternal grandfather; Heart disease in his father, paternal grandfather, and paternal grandmother; Hyperlipidemia in his maternal grandfather, maternal grandmother, and mother; Hypertension in his father; Stroke in his father.  Wt Readings from Last 3 Encounters:  01/05/17 188 lb (85.3 kg)  10/12/16 182 lb (82.6 kg)  07/20/16 185 lb 3.2 oz (84 kg)   PHYSICAL EXAM BP 126/90   Pulse 80   Ht 6' (1.829 m)   Wt 188 lb (85.3 kg)   BMI 25.50 kg/m   Physical Exam  Constitutional: He is oriented to person, place, and time. He appears well-developed and well-nourished. No distress.  Healthy-appearing.  Well-groomed.  In good spirits  today.  HENT:  Head: Normocephalic and atraumatic.  Mouth/Throat: No oropharyngeal exudate.  Eyes: EOM are normal. No scleral icterus.  Neck: Normal range of motion. Neck supple. No hepatojugular reflux and no JVD present. Carotid bruit is present (Cannot exclude soft bilateral bruits.  May be because of breathing).  Cardiovascular: Normal rate, regular rhythm, normal heart sounds and intact distal pulses. Exam reveals no gallop and no friction rub.  No murmur heard. Nondisplaced PMI  Pulmonary/Chest: Effort normal. No respiratory distress. He has no wheezes. He has no rales.  Mild diffuse interstitial sounds.  But no rales or rhonchi  Abdominal: Soft. Bowel sounds are normal. He exhibits no distension. There is no tenderness.  Musculoskeletal: Normal range of motion. He exhibits no edema.  Neurological: He is alert and oriented to person, place, and time. No cranial nerve deficit (Grossly intact).  Skin: Skin is warm and dry. No rash noted. No erythema.  Psychiatric: He has a normal mood and affect. His behavior is normal. Thought content normal.  Nursing note and vitals reviewed.   Adult ECG Report none  Other studies Reviewed: Additional studies/ records that were reviewed today include:  Recent Labs:    Lab Results  Component Value Date   CHOL 149 09/03/2016   HDL 34 (L) 09/03/2016  LDLCALC 66 09/03/2016   LDLDIRECT 109.0 09/03/2015   TRIG 243 (H) 09/03/2016   CHOLHDL 4.4 09/03/2016    ASSESSMENT / PLAN: Problem List Items Addressed This Visit    Angina, class II ([place]) (Chronic)    Stable with no further angina after his last PCI.  We had placed him on amlodipine at the time of his last PCI, but now I think with his labile blood pressures we will simply discontinued to allow a little more permissive hypertension and avoid dizziness.  Continue current dose of beta-blocker however along with aspirin and Effient for now.      CAD S/P percutaneous coronary angioplasty  (Chronic)    Doing quite well after his last PCI.  As I noted before, his culprit lesion during the last cath exactly match the stress test, indicating that his stress test is a reasonable option for him.  He feels much better since the PCI. Plan for now will be to continue with aspirin and Effient until at least next April.  At that time we can discuss potentially switching to Plavix versus twice daily Xarelto 2.5 mg based on most recent study trials. He is on Crestor and beta-blocker. I plan to stop amlodipine to help his dizziness.      Cardiomyopathy, ischemic; EF~45% with hypokinesis of the inferior and inferolateral myocardium; CO 3.25 (Chronic)    Thankfully, no active CHF symptoms.  Not unexpectedly he has hypokinesis in the area where he has had a couple infarcts.  Relatively euvolemic.  With his borderline blood pressures he is only on low-dose Toprol now with stopping amlodipine. No PND, orthopnea or edema.  No requirement for diuretic.      Chronotropic incompetence with left ventricular dysfunction --> improved with reduction of beta blocker (Chronic)    Interestingly, he had significant cardiac incompetence on beta-blocker, however with lower dose of beta-blocker his heart rate tends to race.  He had significant sinus tachycardia noted on his event monitor.  I question if potentially some of his sinus tachycardia is related to using his inhaler.  I have relatively reluctant to increase his beta-blocker dose because of this reason however.      COPD (chronic obstructive pulmonary disease) ([facility]) (Chronic)    He is having lots of issues with his inhaler knee.  I think he probably needs some long-acting bronchodilator or beta agonist such as Spiriva.  I asked that he discuss this with his pulmonologist.      Dizziness - Primary    A lot of the dizziness he is describing almost sounds vestibular in nature.  It could be vertigo but it is not quite as debilitating as vertigo would be.  It  is often times sitting, not necessarily with standing. Plan:   Discontinue amlodipine to allow for some mild permissive hypertension  Check carotid Dopplers to exclude vertebrobasilar disease.  Add standing dose of meclizine at lunch with as needed dosing       Relevant Medications   meclizine (ANTIVERT) tablet 12.5 mg   Other Relevant Orders   VAS US CAROTID   Essential hypertension (Chronic)    Well-controlled now he is no longer on an ACE inhibitor or ARB.  He is only on Toprol with [place] stopping his amlodipine to avoid dizziness.      Hyperlipidemia with target LDL less than 70; by his report he is back on Crestor (Chronic)    Back on Crestor.  Last labs show his LDL to be in the normal  range.  Continue current dose as he is tolerating it well.        Current medicines are reviewed at length with the patient today. (+/- concerns) Insurance asking about converting from Effient to Clopidogrel -> The following changes have been made: will convert either to Plavix or twice daily low-dose Xarelto at next follow-up  Patient Instructions  Medication Instructions:  Your physician has recommended you make the following change in your medication:  1) STOP Amlodipine 2) START Meclizine 12.'5mg'$  tablet DAILY by mouth with Lunch; may take additional 12.'5mg'$  tablet as needed for dizziness   Labwork: none  Testing/Procedures: Your physician has requested that you have a carotid duplex. This test is an ultrasound of the carotid arteries in your neck. It looks at blood flow through these arteries that supply the brain with blood. Allow one hour for this exam. There are no restrictions or special instructions.   Follow-Up: Your physician recommends that you schedule a follow-up appointment in: 4 months with Dr. Ellyn Hack.   Any Other Special Instructions Will Be Listed Below (If Applicable).  Dr. Ellyn Hack recommends that you speak with your pulmonologist about starting a long acting  inhaler to decrease your need for rescue inhaler.   If you need a refill on your cardiac medications before your next appointment, please call your pharmacy.     Studies Ordered:   No orders of the defined types were placed in this encounter.     Glenetta Hew, M.D., M.S. Interventional Cardiologist   Pager # [phone] Phone # [phone] [address]. [place], [place] [address]

## 2017-01-07 ENCOUNTER — Encounter: Payer: Self-pay | Admitting: Cardiology

## 2017-01-07 NOTE — Assessment & Plan Note (Signed)
Thankfully, no active CHF symptoms.  Not unexpectedly he has hypokinesis in the area where he has had a couple infarcts.  Relatively euvolemic.  With his borderline blood pressures he is only on low-dose Toprol now with stopping amlodipine. No PND, orthopnea or edema.  No requirement for diuretic.

## 2017-01-07 NOTE — Assessment & Plan Note (Addendum)
A lot of the dizziness he is describing almost sounds vestibular in nature.  It could be vertigo but it is not quite as debilitating as vertigo would be.  It is often times sitting, not necessarily with standing. Plan:   Discontinue amlodipine to allow for some mild permissive hypertension  Check carotid Dopplers to exclude vertebrobasilar disease.  Add standing dose of meclizine at lunch with as needed dosing

## 2017-01-07 NOTE — Assessment & Plan Note (Signed)
Stable with no further angina after his last PCI.  We had placed him on amlodipine at the time of his last PCI, but now I think with his labile blood pressures we will simply discontinued to allow a little more permissive hypertension and avoid dizziness.  Continue current dose of beta-blocker however along with aspirin and Effient for now.

## 2017-01-07 NOTE — Assessment & Plan Note (Signed)
Interestingly, he had significant cardiac incompetence on beta-blocker, however with lower dose of beta-blocker his heart rate tends to race.  He had significant sinus tachycardia noted on his event monitor.  I question if potentially some of his sinus tachycardia is related to using his inhaler.  I have relatively reluctant to increase his beta-blocker dose because of this reason however.

## 2017-01-07 NOTE — Assessment & Plan Note (Signed)
Well-controlled now he is no longer on an ACE inhibitor or ARB.  He is only on Toprol with [place] stopping his amlodipine to avoid dizziness.

## 2017-01-07 NOTE — Assessment & Plan Note (Signed)
Back on Crestor.  Last labs show his LDL to be in the normal range.  Continue current dose as he is tolerating it well.

## 2017-01-07 NOTE — Assessment & Plan Note (Signed)
Doing quite well after his last PCI.  As I noted before, his culprit lesion during the last cath exactly match the stress test, indicating that his stress test is a reasonable option for him.  He feels much better since the PCI. Plan for now will be to continue with aspirin and Effient until at least next April.  At that time we can discuss potentially switching to Plavix versus twice daily Xarelto 2.5 mg based on most recent study trials. He is on Crestor and beta-blocker. I plan to stop amlodipine to help his dizziness.

## 2017-01-07 NOTE — Assessment & Plan Note (Signed)
He is having lots of issues with his inhaler knee.  I think he probably needs some long-acting bronchodilator or beta agonist such as Spiriva.  I asked that he discuss this with his pulmonologist.

## 2017-01-11 DIAGNOSIS — M9902 Segmental and somatic dysfunction of thoracic region: Secondary | ICD-10-CM | POA: Diagnosis not present

## 2017-01-11 DIAGNOSIS — M62830 Muscle spasm of back: Secondary | ICD-10-CM | POA: Diagnosis not present

## 2017-01-11 DIAGNOSIS — M5134 Other intervertebral disc degeneration, thoracic region: Secondary | ICD-10-CM | POA: Diagnosis not present

## 2017-01-11 DIAGNOSIS — M9901 Segmental and somatic dysfunction of cervical region: Secondary | ICD-10-CM | POA: Diagnosis not present

## 2017-01-13 DIAGNOSIS — M62830 Muscle spasm of back: Secondary | ICD-10-CM | POA: Diagnosis not present

## 2017-01-13 DIAGNOSIS — M9902 Segmental and somatic dysfunction of thoracic region: Secondary | ICD-10-CM | POA: Diagnosis not present

## 2017-01-13 DIAGNOSIS — M5134 Other intervertebral disc degeneration, thoracic region: Secondary | ICD-10-CM | POA: Diagnosis not present

## 2017-01-13 DIAGNOSIS — M9901 Segmental and somatic dysfunction of cervical region: Secondary | ICD-10-CM | POA: Diagnosis not present

## 2017-01-14 DIAGNOSIS — M5134 Other intervertebral disc degeneration, thoracic region: Secondary | ICD-10-CM | POA: Diagnosis not present

## 2017-01-14 DIAGNOSIS — M62830 Muscle spasm of back: Secondary | ICD-10-CM | POA: Diagnosis not present

## 2017-01-14 DIAGNOSIS — M9901 Segmental and somatic dysfunction of cervical region: Secondary | ICD-10-CM | POA: Diagnosis not present

## 2017-01-14 DIAGNOSIS — M9902 Segmental and somatic dysfunction of thoracic region: Secondary | ICD-10-CM | POA: Diagnosis not present

## 2017-01-20 ENCOUNTER — Telehealth: Payer: Self-pay | Admitting: Cardiology

## 2017-01-20 MED ORDER — MECLIZINE HCL 12.5 MG PO TABS: 12.5000 mg | ORAL_TABLET | Freq: Every day | ORAL | 0 refills | Status: DC

## 2017-01-20 NOTE — Telephone Encounter (Signed)
Patient called in requesting a prescription for  Meclizine 12.5 mg daily be sent into [facility]. This was ordered at his last visit with Dr. Ellyn Hack on 11/28. The prescription has been sent.

## 2017-01-20 NOTE — Telephone Encounter (Signed)
New message   Pt verbalized that he is calling because he wants   To confirm that he was suppose to start a new medication since his last appt 01/05/2017

## 2017-01-21 ENCOUNTER — Ambulatory Visit (HOSPITAL_COMMUNITY)
Admission: RE | Admit: 2017-01-21 | Payer: PPO | Source: Ambulatory Visit | Attending: Cardiology | Admitting: Cardiology

## 2017-01-25 ENCOUNTER — Ambulatory Visit (HOSPITAL_COMMUNITY)
Admission: RE | Admit: 2017-01-25 | Discharge: 2017-01-25 | Disposition: A | Discharge status: Home / Self Care | Payer: PPO | Source: Ambulatory Visit | Attending: Cardiovascular Disease | Admitting: Cardiovascular Disease

## 2017-01-25 DIAGNOSIS — Z794 Long term (current) use of insulin: Secondary | ICD-10-CM | POA: Diagnosis not present

## 2017-01-25 DIAGNOSIS — I2510 Atherosclerotic heart disease of native coronary artery without angina pectoris: Secondary | ICD-10-CM | POA: Insufficient documentation

## 2017-01-25 DIAGNOSIS — I10 Essential (primary) hypertension: Secondary | ICD-10-CM | POA: Insufficient documentation

## 2017-01-25 DIAGNOSIS — R42 Dizziness and giddiness: Secondary | ICD-10-CM | POA: Diagnosis not present

## 2017-01-25 DIAGNOSIS — E119 Type 2 diabetes mellitus without complications: Secondary | ICD-10-CM | POA: Diagnosis not present

## 2017-01-25 DIAGNOSIS — F17200 Nicotine dependence, unspecified, uncomplicated: Secondary | ICD-10-CM | POA: Insufficient documentation

## 2017-01-25 DIAGNOSIS — I6523 Occlusion and stenosis of bilateral carotid arteries: Secondary | ICD-10-CM | POA: Diagnosis not present

## 2017-01-25 DIAGNOSIS — E785 Hyperlipidemia, unspecified: Secondary | ICD-10-CM | POA: Insufficient documentation

## 2017-02-10 ENCOUNTER — Other Ambulatory Visit: Payer: Self-pay | Admitting: Internal Medicine

## 2017-02-11 NOTE — Telephone Encounter (Signed)
Copied from [place] [id number]. Topic: Quick Communication - Rx Refill/Question >> Feb 11, 2017  9:41 AM Carolyn Stare wrote: Has the patient contacted their pharmacy    yes   RX PROAIR HFA 108 (90 Base) MCG/ACT inhaler    pt said he only has 4 puffs left    Preferred Pharmacy (with phone number or street name  [facility] [place]   Agent: Please be advised that RX refills may take up to 3 business days. We ask that you follow-up with your pharmacy.

## 2017-02-11 NOTE — Telephone Encounter (Signed)
Copied from [place] [id number]. Topic: Quick Communication - Rx Refill/Question >> Feb 11, 2017  9:41 AM Carolyn Stare wrote: Has the patient contacted their pharmacy    yes   RX PROAIR HFA 108 (90 Base) MCG/ACT inhaler    pt said he only has 4 puffs left    Preferred Pharmacy (with phone number or street name  [facility]   Agent: Please be advised that RX refills may take up to 3 business days. We ask that you follow-up with your pharmacy.

## 2017-03-12 ENCOUNTER — Other Ambulatory Visit: Payer: Self-pay | Admitting: Internal Medicine

## 2017-03-14 NOTE — Telephone Encounter (Signed)
Electronic refill request Last office visit 03/30/16 Last refill 02/11/17 8.5 each/1 refill

## 2017-04-24 ENCOUNTER — Other Ambulatory Visit: Payer: Self-pay | Admitting: Internal Medicine

## 2017-04-25 ENCOUNTER — Telehealth: Payer: Self-pay

## 2017-04-25 NOTE — Telephone Encounter (Signed)
Copied from [place]. Topic: Quick Communication - See Telephone Encounter >> Apr 25, 2017  4:11 PM Oneta Rack wrote: Osvaldo Human name: Troy Parrish  Relation to pt: Troy Parrish Call back number: [phone]  Pharmacy: [place], [place] [phone] (Phone) [phone] (Fax)   Reason for call:  Patient just received a 30 day supply 04/11/17 therefore unable to refill PROAIR HFA 108 (90 Base) MCG/ACT inhaler  that was prescribed today mainly because its to early. Patient needs to be advised on how to use inhaler as per pharmacist. Pharmacist would like to discuss please advise >> Apr 25, 2017  4:15 PM Oneta Rack wrote: Osvaldo Human name: Troy Parrish  Relation to pt: Troy Parrish Call back number: [phone]  Pharmacy: [place], [place] [phone] (Phone) [phone] (Fax)   Reason for call:  Patient just received a 30 day supply 04/11/17 therefore unable to refill PROAIR HFA 108 (90 Base) MCG/ACT inhaler  that was prescribed today mainly because its to early. Patient needs to be advised on how to use inhaler as per pharmacist. Pharmacist would like to discuss please advise

## 2017-04-25 NOTE — Telephone Encounter (Signed)
Left detailed msg per DPR... We need to know how much and how often pt is using the inhaler as the last Rx was filled 04/11/17 and pt is requesting Refill

## 2017-04-28 NOTE — Telephone Encounter (Signed)
Left detailed msg on VM per DPR.  ?

## 2017-05-03 ENCOUNTER — Ambulatory Visit: Payer: PPO | Admitting: Cardiology

## 2017-05-09 ENCOUNTER — Other Ambulatory Visit: Payer: Self-pay | Admitting: Cardiology

## 2017-05-09 ENCOUNTER — Other Ambulatory Visit: Payer: Self-pay | Admitting: Internal Medicine

## 2017-05-09 MED ORDER — ALBUTEROL SULFATE HFA 108 (90 BASE) MCG/ACT IN AERS: INHALATION_SPRAY | RESPIRATORY_TRACT | 0 refills | Status: DC

## 2017-05-09 NOTE — Telephone Encounter (Signed)
Copied from [place] [id number]. Topic: Quick Communication - See Telephone Encounter >> May 09, 2017 11:42 AM Conception Chancy, NT wrote: CRM for notification. See Telephone encounter for: 05/09/17.  Patient is going out of town today 05/09/17 due to his father was admitted into hospice. Patient is needing a refill on PROAIR HFA 108 (90 Base) MCG/ACT inhaler. Patient has a physical scheduled for 07/01/17. Please advise.   [place], [place] [address] Phone: [phone] Fax: [phone]

## 2017-05-09 NOTE — Telephone Encounter (Signed)
Per DPR left v/m refilling pro air x 1; pt to keep appt on 07/01/17 for CPX.[employer].

## 2017-05-10 NOTE — Telephone Encounter (Signed)
Threasa Beards CMA came to me and per 04/25/17 phone note, Threasa Beards wanted to speak with pt about albuterol inhaler; per med list hx and current pt had last refilled inhaler Qty 8.5 each with one refill on 02/11/17. I refilled in separate refill request due to pt going out of town due to family need and pt has CPX scheduled on 07/01/17. I spoke with Herbie Baltimore pharmacist at [facility]. Herbie Baltimore said pt last refilled albuterol inhaler on 03/12/17. The 8.5 size inhaler if used as instructed should last 30 days. So by refilling on 05/10/17 pt is not refilling to often. Threasa Beards voiced understanding but my info does not match the info in 04/25/17 phone note.

## 2017-05-15 ENCOUNTER — Emergency Department
Admission: EM | Admit: 2017-05-15 | Discharge: 2017-05-15 | Disposition: A | Discharge status: Home / Self Care | Payer: PPO | Attending: Emergency Medicine | Admitting: Emergency Medicine

## 2017-05-15 ENCOUNTER — Emergency Department: Payer: PPO

## 2017-05-15 ENCOUNTER — Encounter: Payer: Self-pay | Admitting: Emergency Medicine

## 2017-05-15 DIAGNOSIS — I5042 Chronic combined systolic (congestive) and diastolic (congestive) heart failure: Secondary | ICD-10-CM | POA: Insufficient documentation

## 2017-05-15 DIAGNOSIS — Z79899 Other long term (current) drug therapy: Secondary | ICD-10-CM | POA: Diagnosis not present

## 2017-05-15 DIAGNOSIS — Z7982 Long term (current) use of aspirin: Secondary | ICD-10-CM | POA: Insufficient documentation

## 2017-05-15 DIAGNOSIS — J069 Acute upper respiratory infection, unspecified: Secondary | ICD-10-CM | POA: Diagnosis not present

## 2017-05-15 DIAGNOSIS — J449 Chronic obstructive pulmonary disease, unspecified: Secondary | ICD-10-CM | POA: Insufficient documentation

## 2017-05-15 DIAGNOSIS — I25110 Atherosclerotic heart disease of native coronary artery with unstable angina pectoris: Secondary | ICD-10-CM | POA: Insufficient documentation

## 2017-05-15 DIAGNOSIS — I110 Hypertensive heart disease with heart failure: Secondary | ICD-10-CM | POA: Insufficient documentation

## 2017-05-15 DIAGNOSIS — R05 Cough: Secondary | ICD-10-CM | POA: Diagnosis not present

## 2017-05-15 DIAGNOSIS — F17210 Nicotine dependence, cigarettes, uncomplicated: Secondary | ICD-10-CM | POA: Diagnosis not present

## 2017-05-15 DIAGNOSIS — B9789 Other viral agents as the cause of diseases classified elsewhere: Secondary | ICD-10-CM | POA: Insufficient documentation

## 2017-05-15 DIAGNOSIS — Z8709 Personal history of other diseases of the respiratory system: Secondary | ICD-10-CM

## 2017-05-15 DIAGNOSIS — Z951 Presence of aortocoronary bypass graft: Secondary | ICD-10-CM | POA: Insufficient documentation

## 2017-05-15 DIAGNOSIS — E119 Type 2 diabetes mellitus without complications: Secondary | ICD-10-CM | POA: Diagnosis not present

## 2017-05-15 DIAGNOSIS — R0981 Nasal congestion: Secondary | ICD-10-CM | POA: Diagnosis not present

## 2017-05-15 MED ORDER — FLUTICASONE PROPIONATE 50 MCG/ACT NA SUSP: 2.0000 | Freq: Every day | NASAL | 0 refills | Status: AC

## 2017-05-15 MED ORDER — BENZONATATE 100 MG PO CAPS: 200.0000 mg | ORAL_CAPSULE | Freq: Three times a day (TID) | ORAL | 0 refills | Status: DC | PRN

## 2017-05-15 NOTE — ED Provider Notes (Signed)
[facility] Emergency Department Provider Note  ____________________________________________   First MD Initiated Contact with Patient 05/15/17 [phone]     (approximate)  I have reviewed the triage vital signs and the nursing notes.   HISTORY  Chief Complaint Cough and Nasal Congestion   HPI Troy Parrish is a 57 y.o. male complaint of cough and congestion started yesterday.  Patient denies any fever chills.  Patient has taken Coricidin HBP once for his nasal congestion.  Wife states that they had nothing at home for cough.  Should not continues to smoke over one pack cigarettes per day.   Past Medical History:  Diagnosis Date  . Bronchitis    Gets bronchitis almost every year  . CAD S/P percutaneous coronary angioplasty 06/2011; 6/ & 11/2013   S/P PCI to all 3 major vessels;a)  Ant STEMI 2001- BMS-LAD x2 -->(redo PCI 6/'15 -- pLAD Xience DES 2.5 x 12- 2.75 mm, mLAD 2.25 x 12 - 2.7 mm), b) '06 UA --> Cx- OM DES; c) 2013 Inf MI BMS mRCA --> d) 10/'15 PCI dRCA Promus P DES 4.0 x 16 (4.25 mm); PTCA of RPL2 (2.0 mm) &RPDA (2.25 mm) 11/2013; 4/18 PCI OM1 Synergy DES  2.5 x 16  . COPD (chronic obstructive pulmonary disease) ([place])   . Elevated WBC count   . Emphysema of lung ([place])   . Essential hypertension 07/07/2011  . Former heavy tobacco smoker     quit in May 2015 after multiple attempts at trying to quit before   . GERD (gastroesophageal reflux disease)   . Glucose intolerance (pre-diabetes) June 2015    hemoglobin A1c 6.6  . Headache    "Imdur related; stopped taking it; headaches went away" (11/15/2013)  . Heart murmur   . History of colon polyps   . History of stomach ulcers   . Hyperlipidemia with target LDL less than 70 07/07/2011  . Metabolic syndrome    Pre-diabetes, hypertension and truncal obesity as well as dyslipidemia  . OSA on CPAP   . Pneumonia "several times"  . Recurrent upper respiratory infection (URI)   . Seizures ([place]) "several"   "last one was ~ 2011" (11/15/2013)  . Shortness of breath dyspnea   . ST elevation myocardial infarction (STEMI) of anterior wall ([place]) 2001   History of -- 2 stents in early and distal mid LAD; prior cardiologist was Dr. Remi Haggard in [place]  . ST elevation myocardial infarction (STEMI) of inferior wall ([place]) 07/07/2011   Occluded RCA 8.83G54 INTEGRITY; Echo 07/2013: EF 45-50%, Inferior & Posterolateral HK.   Marland Kitchen Umbilical hernia   . Unstable angina ([place]) 2006; 07/2013   Cx-OM - PCI 2.5 mm 13 mm Cypher DES [name], [place]) ; 07/2013: Severe ISR of both prox & Distal LAD stentS --> 2 DES stents (1 at prox edge of the proximal stent, 2nd covers the entire distal stent as well as proximal and distal edge stenose)   . Upper respiratory infection     Patient Active Problem List   Diagnosis Date Noted  . Dizziness 01/05/2017  . History of nonadherence to medical treatment 09/12/2015  . Personal history of colonic polyps   . Reflux esophagitis   . Presence of drug coated stent in right coronary artery 11/15/2013  . Chronic combined systolic and diastolic congestive heart failure, NYHA class 2 ([place]) 11/07/2013  . Angina, class II ([place]) 11/07/2013  . DM2 (diabetes mellitus, type 2) ([place]) 08/07/2013  . OSA (obstructive sleep apnea) 12/17/2012  .  COPD (chronic obstructive pulmonary disease) ([place]) 12/17/2012  . Chronotropic incompetence with left ventricular dysfunction --> improved with reduction of beta blocker 11/28/2012  . Other fatigue 12/18/2011  . Cardiomyopathy, ischemic; EF~45% with hypokinesis of the inferior and inferolateral myocardium; CO 3.25 07/08/2011  . STEMI, acute inf. wall with 100% RCA stenosis, DES 07/07/11 (prior Anterior STEMI in 2001) 07/07/2011  . Essential hypertension 07/07/2011  . Hyperlipidemia with target LDL less than 70; by his report he is back on Crestor 07/07/2011  . CAD S/P percutaneous coronary angioplasty 07/07/2011  . ST elevation myocardial infarction  (STEMI) of inferior wall ([place]) 07/07/2011  . ST elevation myocardial infarction (STEMI) of anterior wall -- PCI to LAD 11/29/1999    Past Surgical History:  Procedure Laterality Date  . Cardiac MRI [facility]  09/2014   EF 51%. Mod HK of basal-mid Inf wall, mild HK of basal inferoseptum & basal-mid inferolateral wall with hyper enhancement (c/w subendocard RCA MI with significant viability), focal hyper enhancement of apical wall c/w dLAD subendocard MI & complete LAD viability. . No aortic stenosis. Normal RV function. No Ischemia on Adenosine Stress.  . COLONOSCOPY WITH PROPOFOL N/A 03/28/2015   Procedure: COLONOSCOPY WITH PROPOFOL;  Surgeon: Lucilla Lame, MD;  Location: [facility];  Service: Endoscopy;  Laterality: N/A;  . CORONARY ANGIOPLASTY WITH STENT PLACEMENT  2001   ANTERIOR mi with a  PCI to LAD; [place], [place] - Dr. Dorris Fetch  . CORONARY ANGIOPLASTY WITH STENT PLACEMENT  2006   [place] by Dr Dorris Fetch -lesion lft circ/OM1 with 2.5 x 61m Cypher DES  . CORONARY STENT INTERVENTION N/A 05/19/2016   Procedure: Coronary Stent Intervention;  Surgeon: DLeonie Man MD;  [facility];  Service: Cardiovascular: PCI pOM1 - Synergy DES 2.5 x 16 (overlaps old stent)  . ESOPHAGOGASTRODUODENOSCOPY (EGD) WITH PROPOFOL N/A 03/28/2015   Procedure: ESOPHAGOGASTRODUODENOSCOPY (EGD) WITH PROPOFOL;  Surgeon: DLucilla Lame MD;  [place] [facility]  Service: Endoscopy;  Laterality: N/A;  . INSERTION OF MESH N/A 04/02/2015   Procedure: INSERTION OF MESH;  Surgeon: RFlorene Glen MD;  Location: [facility];  Service: General;  Laterality: N/A;  . LEFT AND RIGHT HEART CATHETERIZATION WITH CORONARY ANGIOGRAM N/A 07/30/2013   Procedure: LEFT AND RIGHT HEART CATHETERIZATION WITH CORONARY ANGIOGRAM;  Surgeon: DLeonie Man MD;  [facility];  Service: Cardiovascular;  2 separate P & mLAD lesions -> PCI, MOd-Severe dRCA disesase with diffuse RPL/PDA disease (FFR)  . LEFT HEART  CATH  08/03/2013   Procedure: LEFT HEART CATH;  Surgeon: DLeonie Man MD;  [facility];  Service: Cardiovascular;;FFR of RCA - non-flow-limiting  . LEFT HEART CATH AND CORONARY ANGIOGRAPHY N/A 05/19/2016   Procedure: Left Heart Cath and Coronary Angiography;  Surgeon: DLeonie Man MD;  [facility];  Service: Cardiovascular: CULPRIT - 85% pOM1 pre-stent. Stents in both proximal and distal RCA widely patent. PTCA sites in RPL & PDA widely patent with stable 70% small RPL 2. Extensive stenting in the LAD widely patent stents with mild to mod Dz btw prox & mid-distal stents.   .Marland KitchenLEFT HEART CATHETERIZATION WITH CORONARY ANGIOGRAM N/A 07/07/2011   Procedure: LEFT HEART CATHETERIZATION WITH CORONARY ANGIOGRAM;  Surgeon: DLeonie Man MD;  [facility];  Service: Cardiovascular;  inferolat post LV infarct ST-elevation MI  . MET/CPET  11/09/2011   submax. effort 1.05 RER peak V02 was 51% ,chronotropic incomp.hrt rate lows 80s to 100  .  MET/CPET  September 2016   [facility]: Mild functional impairment due primarily to mild pulmonary and circulatory limitations. Also suggest physical deconditioning (seen with low-normal aerobic reserve). Mild VQ mismatch with exercise.  Ventilatory reserve exhausted with peak exercise demonstrated pulmonary limitation. No significant decrease in postexercise FEV1 compared to rest --> suggest no exercise induced bronchospasm  . NM MYOVIEW ([place] HX)  11/30/2011   EF 45% ,exercise 10 METS, infarct\scar w mild perinfarct ischemia -basal inferolat and mid inferolat region  . NM MYOVIEW LTD  04/25/2016   Exercised for 9 minutes. Reached 90% max. Heart rate. 9.4 METS. Read as a low risk study with normal EF 55-65%. -> on Review -  there does appear to be a small to moderate sized, medium severtiy reversible perfusion defect in the anterior wall - read by computer, but not noted by reader.  Marland Kitchen PERCUTANEOUS CORONARY STENT INTERVENTION (PCI-S)  07/07/2011    Procedure: PERCUTANEOUS CORONARY STENT INTERVENTION (PCI-S);  Surgeon: Leonie Man, MD;  Location: [facility] LAB;  Service: Cardiovascular;;occluded RCA ,Integrity Resolute DES 2.75 x 18 mm stent - post-dilated 3.1 mm  . PERCUTANEOUS CORONARY STENT INTERVENTION (PCI-S)  07/30/2013   Procedure: PERCUTANEOUS CORONARY STENT INTERVENTION (PCI-S);  Surgeon: Leonie Man, MD;  [facility];  Service: Cardiovascular;;Distal mid LAD: Xience Alpine DES 2.25 mm x 28 mm (overlapping proximal and distal edge of previous stent) - 2.7 mm; proximal LAD 2.5 mm x 12 mm Xience Alpine DES (2.8 mm);; RCA 60-70% stenosis planned staged procedure  . PERCUTANEOUS CORONARY STENT INTERVENTION (PCI-S) N/A 11/15/2013   Procedure: PERCUTANEOUS CORONARY STENT INTERVENTION (PCI-S);  Surgeon: Leonie Man, MD;  Location: [facility] CATH LAB;  Service: Cardiovascular;  Patent Cx & LAD Stents; PCI -dRCA Promus Premier DES 4.0 mm x 16 mm (4.25 mm) dRCA, 2.0 mm Cutting PTCA of RPL2 [place] & POBA of mRPDA  . POLYPECTOMY  03/28/2015   Procedure: POLYPECTOMY;  Surgeon: Lucilla Lame, MD;  Location: [facility];  Service: Endoscopy;;  . RIGHT HEART CATH  June 2015   Normal pressures with severely reduced cardiac output and index. (3.25/1.55)  . TRANSTHORACIC ECHOCARDIOGRAM  07/31/2013; February 2017   a. LVEF 45-50. Inferior posterior hypokinesis with mild dilation. Apparent normal diastolic pressures;   . UMBILICAL HERNIA REPAIR N/A 04/02/2015   Procedure: HERNIA REPAIR UMBILICAL ADULT;  Surgeon: Florene Glen, MD;  Location: [facility];  Service: General;  Laterality: N/A;    Prior to Admission medications   Medication Sig Start Date End Date Taking? Authorizing Provider  albuterol (PROAIR HFA) 108 (90 Base) MCG/ACT inhaler INHALE TWO PUFFS BY MOUTH EVERY 6 HOURS AS NEEDED FOR FOR SHORTNESS OF BREATH 05/09/17   Jearld Fenton, NP  aspirin 81 MG EC tablet Take 1 tablet (81 mg total) by mouth at bedtime. 11/14/15   Leonie Man, MD  benzonatate (TESSALON PERLES) 100 MG capsule Take 2 capsules (200 mg total) by mouth 3 (three) times daily as needed. 05/15/17 05/15/18  Johnn Hai, PA-C  clobetasol cream (TEMOVATE) 9.79 % Apply 1 application topically daily as needed (eczema).    [provider]  fluticasone (FLONASE) 50 MCG/ACT nasal spray Place 2 sprays into both nostrils daily. 05/15/17 05/15/18  Johnn Hai, PA-C  meclizine (ANTIVERT) 12.5 MG tablet Take 1 tablet (12.5 mg total) by mouth daily. May take an additional 12.5 mg as needed for dizziness. 01/20/17   Leonie Man, MD  metoprolol succinate (TOPROL-XL) 25 MG 24 hr  tablet Take 25 mg by mouth every evening.    [provider]  nitroGLYCERIN (NITROSTAT) 0.4 MG SL tablet Place 1 tablet (0.4 mg total) under the tongue every 5 (five) minutes as needed for chest pain. 09/10/15   Leonie Man, MD  omeprazole (PRILOSEC) 20 MG capsule TAKE ONE CAPSULE BY MOUTH DAILY 05/09/17   Leonie Man, MD  prasugrel (EFFIENT) 10 MG TABS tablet Take 10 mg by mouth every evening.    [provider]  rosuvastatin (CRESTOR) 40 MG tablet Take 1 tablet (40 mg total) by mouth daily. 05/26/16   Almyra Deforest, PA    Allergies Niacin and related  Family History  Problem Relation Age of Onset  . Coronary artery disease Father   . Heart disease Father   . Stroke Father   . Hypertension Father   . Hyperlipidemia Mother   . Diabetes Mother   . Hyperlipidemia Maternal Grandmother   . Diabetes Maternal Grandmother   . Hyperlipidemia Maternal Grandfather   . Diabetes Maternal Grandfather   . Emphysema Maternal Grandfather   . Heart disease Paternal Grandmother   . Heart disease Paternal Grandfather   . Cancer Maternal Aunt        breast    Social History Social History   Tobacco Use  . Smoking status: Current Every Day Smoker    Packs/day: 0.50    Years: 40.00    Pack years: 20.00    Types: Cigarettes    Last attempt to quit:  06/19/2013    Years since quitting: 3.9  . Smokeless tobacco: Never Used  . Tobacco comment: Quit with Bupropion 15m - > unfortunately, he restarted  Substance Use Topics  . Alcohol use: Yes    Comment: rare  . Drug use: No    Review of Systems Constitutional: No fever/chills Eyes: No visual changes. ENT: No sore throat.  Positive nasal congestion. Cardiovascular: Denies chest pain. Respiratory: Denies shortness of breath.  Positive for cough. Gastrointestinal: No abdominal pain.  No nausea, no vomiting.  No diarrhea.   Musculoskeletal: Negative for muscle aches. Skin: Negative for rash. Neurological: Negative for headaches, focal weakness or numbness. ____________________________________________   PHYSICAL EXAM:  VITAL SIGNS: ED Triage Vitals  Enc Vitals Group     BP 05/15/17 0612 127/82     Pulse Rate 05/15/17 0612 (!) 104     Resp 05/15/17 0612 18     Temp 05/15/17 0612 97.8 F (36.6 C)     Temp Source 05/15/17 0612 Oral     SpO2 05/15/17 0612 97 %     Weight 05/15/17 0609 185 lb (83.9 kg)     Height 05/15/17 0609 6' (1.829 m)     Head Circumference --      Peak Flow --      Pain Score 05/15/17 0609 0     Pain Loc --      Pain Edu? --      Excl. in [place] --    Constitutional: Alert and oriented. Well appearing and in no acute distress. Eyes: Conjunctivae are normal.  Head: Atraumatic. Nose: Moderate congestion/no rhinnorhea. Mouth/Throat: Mucous membranes are moist.  Oropharynx non-erythematous.  Moderate posterior drainage. Neck: No stridor.   Hematological/Lymphatic/Immunilogical: No cervical lymphadenopathy. Cardiovascular: Normal rate, regular rhythm. Grossly normal heart sounds.  Good peripheral circulation. Respiratory: Normal respiratory effort.  No retractions. Lungs CTAB.  Occasional cough is noted. Musculoskeletal: No lower extremity tenderness nor edema.  No joint effusions. Neurologic:  Normal speech and  language. No gross focal neurologic deficits  are appreciated. Skin:  Skin is warm, dry and intact. Psychiatric: Mood and affect are normal. Speech and behavior are normal.  ____________________________________________   LABS (all labs ordered are listed, but only abnormal results are displayed)  Labs Reviewed - No data to display  RADIOLOGY  ED MD interpretation:  Chest x-ray is negative for acute changes.  Official radiology report(s): Dg Chest 2 View  Result Date: 05/15/2017 CLINICAL DATA:  Cough and congestion EXAM: CHEST - 2 VIEW COMPARISON:  08/29/2015 FINDINGS: Heart size within normal limits. Coronary stent noted. Negative for heart failure. COPD with hyperinflation of the lungs. Negative for infiltrate effusion or mass. Thoracic degenerative spurring. IMPRESSION: COPD without acute abnormality. Electronically Signed   By: Franchot Gallo M.D.   On: 05/15/2017 07:13    ____________________________________________   PROCEDURES  Procedure(s) performed: None  Procedures  Critical Care performed: No  ____________________________________________   INITIAL IMPRESSION / ASSESSMENT AND PLAN / ED COURSE  Patient was reassured from chest x-ray that there is no pneumonia noted.  Symptoms are consistent with a viral upper respiratory with his underlying COPD.  Patient is encouraged to discontinue smoking and increase fluids.  He is to continue taking Coricidin HBP which he only started yesterday.  He was given a prescription for Tessalon Perles and Flonase nasal spray.  Patient will follow up with his PCP if any continued problems. ____________________________________________   FINAL CLINICAL IMPRESSION(S) / ED DIAGNOSES  Final diagnoses:  Viral URI with cough  Cigarette smoker  History of COPD     ED Discharge Orders        Ordered    benzonatate (TESSALON PERLES) 100 MG capsule  3 times daily PRN     05/15/17 0735    fluticasone (FLONASE) 50 MCG/ACT nasal spray  Daily     05/15/17 0735       Note:  This  document was prepared using Dragon voice recognition software and may include unintentional dictation errors.    Johnn Hai, PA-C 05/15/17 6754    Delman Kitten, MD 05/15/17 [phone]

## 2017-05-15 NOTE — ED Triage Notes (Signed)
Patient with complaint of cough and congestion that started yesterday. Patient denies fever.

## 2017-05-15 NOTE — Discharge Instructions (Signed)
Follow-up with your primary care doctor if any continued problems.  Begin using Tessalon Perles as directed for cough.  Continue Coricidin HBP for congestion.  You may also use saline nose spray to loosen mucus in your nose if needed.  Also Flonase nasal sprays 2 sprays to each nostril once a day as needed for nasal congestion and seasonal allergies.  Discontinue smoking and increase fluids.

## 2017-05-15 NOTE — ED Notes (Signed)
See triage note   States he developed nasal congestion and cough last pm  Denies any fever but did have some"sweating"spells  Afebrile on arrival

## 2017-06-24 ENCOUNTER — Other Ambulatory Visit: Payer: PPO

## 2017-07-01 ENCOUNTER — Ambulatory Visit (INDEPENDENT_AMBULATORY_CARE_PROVIDER_SITE_OTHER): Payer: PPO | Admitting: Internal Medicine

## 2017-07-01 ENCOUNTER — Ambulatory Visit (INDEPENDENT_AMBULATORY_CARE_PROVIDER_SITE_OTHER)
Admission: RE | Admit: 2017-07-01 | Discharge: 2017-07-01 | Disposition: A | Discharge status: Home / Self Care | Payer: PPO | Source: Ambulatory Visit | Attending: Internal Medicine | Admitting: Internal Medicine

## 2017-07-01 VITALS — BP 124/82 | HR 84 | Temp 97.9°F | Wt 186.0 lb

## 2017-07-01 DIAGNOSIS — Z0000 Encounter for general adult medical examination without abnormal findings: Secondary | ICD-10-CM | POA: Diagnosis not present

## 2017-07-01 DIAGNOSIS — R29898 Other symptoms and signs involving the musculoskeletal system: Secondary | ICD-10-CM | POA: Diagnosis not present

## 2017-07-01 DIAGNOSIS — Z125 Encounter for screening for malignant neoplasm of prostate: Secondary | ICD-10-CM | POA: Diagnosis not present

## 2017-07-01 DIAGNOSIS — R7303 Prediabetes: Secondary | ICD-10-CM | POA: Diagnosis not present

## 2017-07-01 DIAGNOSIS — J449 Chronic obstructive pulmonary disease, unspecified: Secondary | ICD-10-CM

## 2017-07-01 DIAGNOSIS — I2510 Atherosclerotic heart disease of native coronary artery without angina pectoris: Secondary | ICD-10-CM | POA: Diagnosis not present

## 2017-07-01 DIAGNOSIS — Z9861 Coronary angioplasty status: Secondary | ICD-10-CM

## 2017-07-01 DIAGNOSIS — K2100 Gastro-esophageal reflux disease with esophagitis, without bleeding: Secondary | ICD-10-CM

## 2017-07-01 DIAGNOSIS — I10 Essential (primary) hypertension: Secondary | ICD-10-CM | POA: Diagnosis not present

## 2017-07-01 DIAGNOSIS — I5042 Chronic combined systolic (congestive) and diastolic (congestive) heart failure: Secondary | ICD-10-CM | POA: Diagnosis not present

## 2017-07-01 DIAGNOSIS — M47816 Spondylosis without myelopathy or radiculopathy, lumbar region: Secondary | ICD-10-CM | POA: Diagnosis not present

## 2017-07-01 DIAGNOSIS — E782 Mixed hyperlipidemia: Secondary | ICD-10-CM | POA: Diagnosis not present

## 2017-07-01 LAB — CBC
HCT: 50.2 % (ref 39.0–52.0)
Hemoglobin: 17.2 g/dL — ABNORMAL HIGH (ref 13.0–17.0)
MCHC: 34.2 g/dL (ref 30.0–36.0)
MCV: 88 fl (ref 78.0–100.0)
Platelets: 346 10*3/uL (ref 150.0–400.0)
RBC: 5.7 Mil/uL (ref 4.22–5.81)
RDW: 14.3 % (ref 11.5–15.5)
WBC: 15.6 10*3/uL — ABNORMAL HIGH (ref 4.0–10.5)

## 2017-07-01 LAB — COMPREHENSIVE METABOLIC PANEL
ALT: 31 U/L (ref 0–53)
AST: 23 U/L (ref 0–37)
Albumin: 4.3 g/dL (ref 3.5–5.2)
Alkaline Phosphatase: 149 U/L — ABNORMAL HIGH (ref 39–117)
BUN: 12 mg/dL (ref 6–23)
CO2: 24 mEq/L (ref 19–32)
Calcium: 9.6 mg/dL (ref 8.4–10.5)
Chloride: 107 mEq/L (ref 96–112)
Creatinine, Ser: 0.89 mg/dL (ref 0.40–1.50)
GFR: 93.6 mL/min (ref >60.00)
Glucose, Bld: 94 mg/dL (ref 70–99)
Potassium: 4.2 mEq/L (ref 3.5–5.1)
Sodium: 140 mEq/L (ref 135–145)
Total Bilirubin: 0.4 mg/dL (ref 0.2–1.2)
Total Protein: 7 g/dL (ref 6.0–8.3)

## 2017-07-01 LAB — LIPID PANEL
Cholesterol: 143 mg/dL (ref 0–200)
HDL: 35.6 mg/dL — ABNORMAL LOW (ref >39.00)
NonHDL: 107.11
Total CHOL/HDL Ratio: 4
Triglycerides: 237 mg/dL — ABNORMAL HIGH (ref 0.0–149.0)
VLDL: 47.4 mg/dL — ABNORMAL HIGH (ref 0.0–40.0)

## 2017-07-01 LAB — LDL CHOLESTEROL, DIRECT: Direct LDL: 78 mg/dL

## 2017-07-01 LAB — PSA, MEDICARE: PSA: 0.58 ng/ml (ref 0.10–4.00)

## 2017-07-01 LAB — HEMOGLOBIN A1C: Hgb A1c MFr Bld: 6 % (ref 4.6–6.5)

## 2017-07-01 MED ORDER — FLUTICASONE-SALMETEROL 100-50 MCG/DOSE IN AEPB
1.0000 | INHALATION_SPRAY | Freq: Two times a day (BID) | RESPIRATORY_TRACT | 3 refills | Status: DC
Start: 2017-07-01 — End: 2017-11-06

## 2017-07-01 NOTE — Progress Notes (Signed)
HPI:  Pt presents to the clinic today for his Medicare Wellness Exam.  CAD: s/p MI with stents.  He denies chest pain.  He is taking rosuvastatin, Effient and Metoprolol as prescribed.  He follows with Dr.Harding  COPD: Chronic cough and shortness of breath. This is triggered by heat and pollen. He is using the Albuterol inhaler every day. He follows with Dr. Halford Chessman.  HTN: His BP today is 124/82.  He is taking Metoprolol as prescribed.  EKG from 05/2016 reviewed.  GERD: Triggered by spicy and tomato-based foods.  He reports this is controlled on Omeprazole.  He only has breakthrough when he forgets to take his Omeprazole. Upper endoscopy from 03/2015 reviewed.  HLD: His last LDL was 66, triglycerides 243, 08/2016.  He is taken Rosuvastatin as prescribed.  He reports he tries to avoid fried foods.  Prediabetes: His last A1c was 6%, 03/2016.  He is not taking any diabetic medication at this time.  He does not check his sugars.  He does check his feet routinely.  He gets yearly eye exams.  CHF, Diastolic: Mild, compensated. Continue Metoprolol. Echo from 2017 reviewed.  Past Medical History:  Diagnosis Date  . Bronchitis    Gets bronchitis almost every year  . CAD S/P percutaneous coronary angioplasty 06/2011; 6/ & 11/2013   S/P PCI to all 3 major vessels;a)  Ant STEMI 2001- BMS-LAD x2 -->(redo PCI 6/'15 -- pLAD Xience DES 2.5 x 12- 2.75 mm, mLAD 2.25 x 12 - 2.7 mm), b) '06 UA --> Cx- OM DES; c) 2013 Inf MI BMS mRCA --> d) 10/'15 PCI dRCA Promus P DES 4.0 x 16 (4.25 mm); PTCA of RPL2 (2.0 mm) &RPDA (2.25 mm) 11/2013; 4/18 PCI OM1 Synergy DES  2.5 x 16  . COPD (chronic obstructive pulmonary disease) ([place])   . Elevated WBC count   . Emphysema of lung ([place])   . Essential hypertension 07/07/2011  . Former heavy tobacco smoker     quit in May 2015 after multiple attempts at trying to quit before   . GERD (gastroesophageal reflux disease)   . Glucose intolerance (pre-diabetes) June 2015    hemoglobin  A1c 6.6  . Headache    "Imdur related; stopped taking it; headaches went away" (11/15/2013)  . Heart murmur   . History of colon polyps   . History of stomach ulcers   . Hyperlipidemia with target LDL less than 70 07/07/2011  . Metabolic syndrome    Pre-diabetes, hypertension and truncal obesity as well as dyslipidemia  . OSA on CPAP   . Pneumonia "several times"  . Recurrent upper respiratory infection (URI)   . Seizures ([place]) "several"   "last one was ~ 2011" (11/15/2013)  . Shortness of breath dyspnea   . ST elevation myocardial infarction (STEMI) of anterior wall ([place]) 2001   History of -- 2 stents in early and distal mid LAD; prior cardiologist was Dr. Remi Haggard in [place]  . ST elevation myocardial infarction (STEMI) of inferior wall ([place]) 07/07/2011   Occluded RCA 4.82L07 INTEGRITY; Echo 07/2013: EF 45-50%, Inferior & Posterolateral HK.   Marland Kitchen Umbilical hernia   . Unstable angina ([place]) 2006; 07/2013   Cx-OM - PCI 2.5 mm 13 mm Cypher DES [name], [place]) ; 07/2013: Severe ISR of both prox & Distal LAD stentS --> 2 DES stents (1 at prox edge of the proximal stent, 2nd covers the entire distal stent as well as proximal and distal edge stenose)   . Upper respiratory infection  Current Outpatient Medications  Medication Sig Dispense Refill  . albuterol (PROAIR HFA) 108 (90 Base) MCG/ACT inhaler INHALE TWO PUFFS BY MOUTH EVERY 6 HOURS AS NEEDED FOR FOR SHORTNESS OF BREATH 8.5 each 0  . aspirin 81 MG EC tablet Take 1 tablet (81 mg total) by mouth at bedtime. 30 tablet   . benzonatate (TESSALON PERLES) 100 MG capsule Take 2 capsules (200 mg total) by mouth 3 (three) times daily as needed. 30 capsule 0  . clobetasol cream (TEMOVATE) 2.11 % Apply 1 application topically daily as needed (eczema).    . fluticasone (FLONASE) 50 MCG/ACT nasal spray Place 2 sprays into both nostrils daily. 16 g 0  . meclizine (ANTIVERT) 12.5 MG tablet Take 1 tablet (12.5 mg total) by mouth daily. May take  an additional 12.5 mg as needed for dizziness. 30 tablet 0  . metoprolol succinate (TOPROL-XL) 25 MG 24 hr tablet Take 25 mg by mouth every evening.    . nitroGLYCERIN (NITROSTAT) 0.4 MG SL tablet Place 1 tablet (0.4 mg total) under the tongue every 5 (five) minutes as needed for chest pain. 25 tablet 3  . omeprazole (PRILOSEC) 20 MG capsule TAKE ONE CAPSULE BY MOUTH DAILY 30 capsule 5  . prasugrel (EFFIENT) 10 MG TABS tablet Take 10 mg by mouth every evening.    . rosuvastatin (CRESTOR) 40 MG tablet Take 1 tablet (40 mg total) by mouth daily. 90 tablet 3   Current Facility-Administered Medications  Medication Dose Route Frequency Provider Last Rate Last Dose  . meclizine (ANTIVERT) tablet 12.5 mg  12.5 mg Oral Daily Leonie Man, MD        Allergies  Allergen Reactions  . Niacin And Related Hives and Itching    Family History  Problem Relation Age of Onset  . Coronary artery disease Father   . Heart disease Father   . Stroke Father   . Hypertension Father   . Hyperlipidemia Mother   . Diabetes Mother   . Hyperlipidemia Maternal Grandmother   . Diabetes Maternal Grandmother   . Hyperlipidemia Maternal Grandfather   . Diabetes Maternal Grandfather   . Emphysema Maternal Grandfather   . Heart disease Paternal Grandmother   . Heart disease Paternal Grandfather   . Cancer Maternal Aunt        breast    Social History   Socioeconomic History  . Marital status: Married    Spouse name: Not on file  . Number of children: Not on file  . Years of education: Not on file  . Highest education level: Not on file  Occupational History  . Not on file  Social Needs  . Financial resource strain: Not on file  . Food insecurity:    Worry: Not on file    Inability: Not on file  . Transportation needs:    Medical: Not on file    Non-medical: Not on file  Tobacco Use  . Smoking status: Current Every Day Smoker    Packs/day: 0.50    Years: 40.00    Pack years: 20.00    Types:  Cigarettes    Last attempt to quit: 06/19/2013    Years since quitting: 4.0  . Smokeless tobacco: Never Used  . Tobacco comment: Quit with Bupropion '150mg'$  - > unfortunately, he restarted  Substance and Sexual Activity  . Alcohol use: Yes    Comment: rare  . Drug use: No  . Sexual activity: Not on file  Lifestyle  . Physical activity:  Days per week: Not on file    Minutes per session: Not on file  . Stress: Not on file  Relationships  . Social connections:    Talks on phone: Not on file    Gets together: Not on file    Attends religious service: Not on file    Active member of club or organization: Not on file    Attends meetings of clubs or organizations: Not on file    Relationship status: Not on file  . Intimate partner violence:    Fear of current or ex partner: Not on file    Emotionally abused: Not on file    Physically abused: Not on file    Forced sexual activity: Not on file  Other Topics Concern  . Not on file  Social History Narrative   He is a married father of 82, grandfather of 6. He does not really get routine exercise.    He is down to 5 or 6 cigarettes a day. He is very seriously wanting to quit. This is a significant cutback for him, but he has pretty much been told he needs to stop by his wife, and so he fully intends to do so. He is willing to try the patches or whatever. He has a social alcoholic beverage every now and then.     Hospitiliaztions: None  Health Maintenance:    Flu: 11/2016  Tetanus: 02/2015  Pneumovax: 11/2016  Prevnar: 11/2014  PSA: 03/2016  Colon Screening: 2017  Eye Doctor: annually  Dental Exam: as needed   Providers:   PCP: Webb Silversmith, NP-C  Cardiologist: Dr. Ellyn Hack  Pulmonologist: Dr. Halford Chessman   I have personally reviewed and have noted:  1. The patient's medical and social history 2. Their use of alcohol, tobacco or illicit drugs 3. Their current medications and supplements 4. The patient's functional ability  including ADL's, fall risks, home safety risks and hearing or visual impairment. 5. Diet and physical activities 6. Evidence for depression or mood disorder  Subjective:   Review of Systems:   Constitutional: Denies fever, malaise, fatigue, headache or abrupt weight changes.  HEENT: Denies eye pain, eye redness, ear pain, ringing in the ears, wax buildup, runny nose, nasal congestion, bloody nose, or sore throat. Respiratory: Patient reports chronic cough and shortness of breath.  Denies difficulty breathing, or sputum production.   Cardiovascular: Denies chest pain, chest tightness, palpitations or swelling in the hands or feet.  Gastrointestinal: Denies abdominal pain, bloating, constipation, diarrhea or blood in the stool.  GU: Denies urgency, frequency, pain with urination, burning sensation, blood in urine, odor or discharge. Musculoskeletal: Patient reports lower extremity weakness.  Decrease in range of motion, difficulty with gait, muscle pain or joint pain and swelling.  Skin: Denies redness, rashes, lesions or ulcercations.  Neurological: Denies dizziness, difficulty with memory, difficulty with speech or problems with balance and coordination.  Psych: Denies anxiety, depression, SI/HI.  No other specific complaints in a complete review of systems (except as listed in HPI above).  Objective:  PE:   BP 124/82   Pulse 84   Temp 97.9 F (36.6 C) (Oral)   Wt 186 lb (84.4 kg)   SpO2 96%   BMI 25.23 kg/m    Wt Readings from Last 3 Encounters:  05/15/17 185 lb (83.9 kg)  01/05/17 188 lb (85.3 kg)  10/12/16 182 lb (82.6 kg)    General: Appears their stated age, well developed, well nourished in NAD. Skin: Warm, dry and intact.  No ulcerations noted. HEENT: Head: normal shape and size; Eyes: sclera white, no icterus, conjunctiva pink, PERRLA and EOMs intact; Ears: Tm's gray and intact, normal light reflex; Throat/Mouth: Teeth present, mucosa pink and moist, no exudate,  lesions or ulcerations noted.  Neck: Neck supple, trachea midline. No masses, lumps or thyromegaly present.  Cardiovascular: Normal rate and rhythm. S1,S2 noted.  No murmur, rubs or gallops noted. No JVD or BLE edema. No carotid bruits noted. Pulmonary/Chest: Normal effort and coarse breath sounds with diminished bases. No respiratory distress. No wheezes, rales or ronchi noted.  Abdomen: Soft and nontender. Normal bowel sounds. No distention or masses noted. Liver, spleen and kidneys non palpable. Musculoskeletal: Strength 5/5 BUE/BLE. No signs of joint swelling.  Neurological: Alert and oriented. Cranial nerves II-XII grossly intact. Coordination normal.  Psychiatric: Mood and affect normal. Behavior is normal. Judgment and thought content normal.     BMET    Component Value Date/Time   NA 142 10/12/2016 1026   NA 139 10/24/2013 1050   K 4.7 10/12/2016 1026   K 3.8 10/24/2013 1050   CL 103 10/12/2016 1026   CL 107 10/24/2013 1050   CO2 17 (L) 10/12/2016 1026   CO2 24 10/24/2013 1050   GLUCOSE 89 10/12/2016 1026   GLUCOSE 104 (H) 05/13/2016 1044   GLUCOSE 108 (H) 10/24/2013 1050   BUN 14 10/12/2016 1026   BUN 8 10/24/2013 1050   CREATININE 0.90 10/12/2016 1026   CREATININE 1.01 05/13/2016 1044   CALCIUM 9.6 10/12/2016 1026   CALCIUM 8.2 (L) 10/24/2013 1050   GFRNONAA 95 10/12/2016 1026   GFRNONAA >60 10/24/2013 1050   GFRAA 110 10/12/2016 1026   GFRAA >60 10/24/2013 1050    Lipid Panel     Component Value Date/Time   CHOL 149 09/03/2016 0950   TRIG 243 (H) 09/03/2016 0950   HDL 34 (L) 09/03/2016 0950   CHOLHDL 4.4 09/03/2016 0950   CHOLHDL 5 03/30/2016 1424   VLDL 35.4 03/30/2016 1424   LDLCALC 66 09/03/2016 0950    CBC    Component Value Date/Time   WBC 12.1 (H) 10/12/2016 1026   WBC 12.0 (H) 05/13/2016 1044   RBC 5.66 10/12/2016 1026   RBC 5.56 05/13/2016 1044   HGB 17.5 10/12/2016 1026   HCT 50.1 10/12/2016 1026   PLT 376 10/12/2016 1026   MCV 89  10/12/2016 1026   MCV 89 10/24/2013 1050   MCH 30.9 10/12/2016 1026   MCH 30.4 05/13/2016 1044   MCHC 34.9 10/12/2016 1026   MCHC 34.0 05/13/2016 1044   RDW 13.8 10/12/2016 1026   RDW 14.5 10/24/2013 1050   LYMPHSABS 4.6 (H) 04/04/2015 1040   LYMPHSABS 2.8 11/07/2013 1509   MONOABS 0.9 04/04/2015 1040   EOSABS 0.1 04/04/2015 1040   EOSABS 0.2 11/07/2013 1509   BASOSABS 0.1 04/04/2015 1040   BASOSABS 0.1 11/07/2013 1509    Hgb A1C Lab Results  Component Value Date   HGBA1C 6.0 03/30/2016      Assessment and Plan:   Medicare Annual Wellness Visit:  Diet: He does eat lean meats. He consumes more veggies than fruits. He tries to avoid fried foods. He drinks mostly water. Physical activity: Sedentary Depression/mood screen: Negative Hearing: Intact to whispered voice Visual acuity: Grossly normal, performs annual eye exam  ADLs: Capable Fall risk: None Home safety: Good Cognitive evaluation: Intact to orientation, naming, recall and repetition EOL planning: Adv directives, full code/ I agree  Preventative Medicine: Encouraged him to get a  flu shot in the fall.  Tetanus, Pneumovax, Prevnar up-to-date.  PSA will be done today with labs.  Colon screening up-to-date.  Encouraged him to consume a balanced diet and exercise regimen.  Advised him to see an eye doctor and dentist annually.  Will check CBC, C met, lipid, A1c, and PSA today.  Lower extremity weakness:  X-ray lumbar spine today to rule out bulging disc with nerve compression Consider physical therapy versus MRI lumbar spine versus referral to neurology for EMG testing  Next appointment:   Webb Silversmith, NP

## 2017-07-01 NOTE — Patient Instructions (Signed)

## 2017-07-03 ENCOUNTER — Encounter: Payer: Self-pay | Admitting: Internal Medicine

## 2017-07-03 DIAGNOSIS — R7303 Prediabetes: Secondary | ICD-10-CM | POA: Insufficient documentation

## 2017-07-03 DIAGNOSIS — D689 Coagulation defect, unspecified: Secondary | ICD-10-CM | POA: Insufficient documentation

## 2017-07-03 NOTE — Assessment & Plan Note (Signed)
CBC and C met today Controlled on Omeprazole Will monitor

## 2017-07-03 NOTE — Assessment & Plan Note (Signed)
C met lipid profile today Encouraged him to consume a low-fat diet Continue Rosuvastatin for now

## 2017-07-03 NOTE — Assessment & Plan Note (Signed)
A1c today Encouraged him to consume a low carb diet We will hold off on medications at this time unless A1c is greater than 6.5% Foot exam today Encouraged yearly eye exams Immunizations up-to-date

## 2017-07-03 NOTE — Assessment & Plan Note (Signed)
Compensated C met today Continue Metoprolol for now He will continue to follow with cardiology

## 2017-07-03 NOTE — Assessment & Plan Note (Signed)
Controlled on Metoprolol Reinforced DASH diet CBC and C met today

## 2017-07-03 NOTE — Assessment & Plan Note (Signed)
C met and lipid profile today He will continue Rosuvastatin, Effient and Metoprolol He will continue to follow with cardiology

## 2017-07-03 NOTE — Assessment & Plan Note (Signed)
Deteriorated Will add Advair, Rx sent to pharmacy Advised him I only want him to use Albuterol Encourage smoking cessation

## 2017-07-05 ENCOUNTER — Other Ambulatory Visit: Payer: Self-pay | Admitting: Cardiology

## 2017-07-05 ENCOUNTER — Telehealth: Payer: Self-pay | Admitting: Internal Medicine

## 2017-07-05 ENCOUNTER — Telehealth: Payer: Self-pay | Admitting: Cardiology

## 2017-07-05 ENCOUNTER — Other Ambulatory Visit: Payer: Self-pay | Admitting: Internal Medicine

## 2017-07-05 MED ORDER — METOPROLOL SUCCINATE ER 25 MG PO TB24: 25.0000 mg | ORAL_TABLET | Freq: Every evening | ORAL | 6 refills | Status: DC

## 2017-07-05 MED ORDER — NITROGLYCERIN 0.4 MG SL SUBL: 0.4000 mg | SUBLINGUAL_TABLET | SUBLINGUAL | 3 refills | Status: DC | PRN

## 2017-07-05 MED ORDER — ROSUVASTATIN CALCIUM 40 MG PO TABS: 40.0000 mg | ORAL_TABLET | Freq: Every day | ORAL | 3 refills | Status: DC

## 2017-07-05 NOTE — Telephone Encounter (Unsigned)
Copied from [place] [id number]. Topic: Quick Communication - Rx Refill/Question >> Jul 05, 2017  4:06 PM Neva Seat wrote:  clobetasol cream (TEMOVATE) 0.05 %   Needing to be filled.  [facility], [address] [place] [address] Phone: [phone] Fax: [phone]

## 2017-07-05 NOTE — Telephone Encounter (Signed)
New Message    *STAT* If patient is at the pharmacy, call can be transferred to refill team.   1. Which medications need to be refilled? (please list name of each medication and dose if known) prasugrel (EFFIENT) 10 MG TABS tablet, rosuvastatin (CRESTOR) 40 MG tablet, metoprolol succinate (TOPROL-XL) 25 MG 24 hr tablet, and nitroGLYCERIN (NITROSTAT) 0.4 MG SL tablet  2. Which pharmacy/location (including street and city if local pharmacy) is medication to be sent to? [place], [place]  3. Do they need a 30 day or 90 day supply? [place]

## 2017-07-05 NOTE — Telephone Encounter (Signed)
Copied from [place] [id number]. Topic: Quick Communication - Rx Refill/Question >> Jul 05, 2017  1:51 PM Bea Graff, NT wrote: Medication: albuterol (PROAIR HFA) 108 (90 Base) MCG/ACT inhaler, prasugrel (EFFIENT) 10 MG TABS tablet, metoprolol succinate (TOPROL-XL) 25 MG 24 hr tablet, rosuvastatin (CRESTOR) 40 MG tablet, clobetasol cream (TEMOVATE) 0.05 % and nitroGLYCERIN (NITROSTAT) 0.4 MG SL tablet  Has the patient contacted their pharmacy? Yes.   (Agent: If no, request that the patient contact the pharmacy for the refill.) (Agent: If yes, when and what did the pharmacy advise?)  Preferred Pharmacy (with phone number or street name): [facility], [place] [phone] (Phone) [phone] (Fax)      Agent: Please be advised that RX refills may take up to 3 business days. We ask that you follow-up with your pharmacy.

## 2017-07-06 MED ORDER — CLOBETASOL PROPIONATE 0.05 % EX CREA: 1.0000 "application " | TOPICAL_CREAM | Freq: Every day | CUTANEOUS | 5 refills | Status: DC | PRN

## 2017-07-06 NOTE — Telephone Encounter (Signed)
Medications refilled.... Others were refilled by Cardiology

## 2017-07-06 NOTE — Telephone Encounter (Signed)
Patient is requesting refill of medications that have been filled by other providers: Proair HFA 108 MCG only Rx prescribed by PCP on list. ( Already pended for provider review)  For review: Effient 10 mg                    Toprol XL 25 mg                    Crestor 40 mg                    Temovate 0.05%                     Nitrostat  0.4 mg  LOV: 07/01/17  PCP: [place]: verified

## 2017-07-16 DIAGNOSIS — H00016 Hordeolum externum left eye, unspecified eyelid: Secondary | ICD-10-CM | POA: Diagnosis not present

## 2017-11-06 ENCOUNTER — Other Ambulatory Visit: Payer: Self-pay | Admitting: Internal Medicine

## 2017-11-06 DIAGNOSIS — I5042 Chronic combined systolic (congestive) and diastolic (congestive) heart failure: Secondary | ICD-10-CM

## 2017-11-29 ENCOUNTER — Telehealth: Payer: Self-pay | Admitting: Cardiology

## 2017-11-29 NOTE — Telephone Encounter (Signed)
Left voicemail instructing patient that he did not have his blood type on file as this has never collected nor documented.

## 2017-11-29 NOTE — Telephone Encounter (Signed)
  Patient would like to know if we have his blood type on file. He checked with his PCP who told him they didn't know it.

## 2017-12-01 ENCOUNTER — Ambulatory Visit (INDEPENDENT_AMBULATORY_CARE_PROVIDER_SITE_OTHER): Payer: PPO

## 2017-12-01 DIAGNOSIS — Z23 Encounter for immunization: Secondary | ICD-10-CM | POA: Diagnosis not present

## 2017-12-09 ENCOUNTER — Other Ambulatory Visit: Payer: Self-pay | Admitting: Cardiology

## 2017-12-29 ENCOUNTER — Encounter: Payer: Self-pay | Admitting: Internal Medicine

## 2017-12-29 ENCOUNTER — Ambulatory Visit (INDEPENDENT_AMBULATORY_CARE_PROVIDER_SITE_OTHER): Payer: PPO | Admitting: Internal Medicine

## 2017-12-29 VITALS — BP 124/78 | HR 90 | Temp 98.2°F | Wt 185.0 lb

## 2017-12-29 DIAGNOSIS — H6123 Impacted cerumen, bilateral: Secondary | ICD-10-CM | POA: Diagnosis not present

## 2017-12-29 DIAGNOSIS — H9012 Conductive hearing loss, unilateral, left ear, with unrestricted hearing on the contralateral side: Secondary | ICD-10-CM | POA: Diagnosis not present

## 2017-12-29 NOTE — Progress Notes (Signed)
Subjective:    Patient ID: Troy Parrish, male    DOB: Jun 04, 1960, 57 y.o.   MRN: [id number]  HPI  Pt presents to the clinic today with c/o not being able to hear out of his left ear. He reports this started 2 days ago. He denies ear pain, drainage or decreased hearing. He denies runny nose, nasal congestion or sore throat. He has tried ear wax removal drops with minimal relief.   Review of Systems      Past Medical History:  Diagnosis Date  . Bronchitis    Gets bronchitis almost every year  . CAD S/P percutaneous coronary angioplasty 06/2011; 6/ & 11/2013   S/P PCI to all 3 major vessels;a)  Ant STEMI 2001- BMS-LAD x2 -->(redo PCI 6/'15 -- pLAD Xience DES 2.5 x 12- 2.75 mm, mLAD 2.25 x 12 - 2.7 mm), b) '06 UA --> Cx- OM DES; c) 2013 Inf MI BMS mRCA --> d) 10/'15 PCI dRCA Promus P DES 4.0 x 16 (4.25 mm); PTCA of RPL2 (2.0 mm) &RPDA (2.25 mm) 11/2013; 4/18 PCI OM1 Synergy DES  2.5 x 16  . COPD (chronic obstructive pulmonary disease) ([place])   . Elevated WBC count   . Emphysema of lung ([place])   . Essential hypertension 07/07/2011  . Former heavy tobacco smoker     quit in May 2015 after multiple attempts at trying to quit before   . GERD (gastroesophageal reflux disease)   . Glucose intolerance (pre-diabetes) June 2015    hemoglobin A1c 6.6  . Headache    "Imdur related; stopped taking it; headaches went away" (11/15/2013)  . Heart murmur   . History of colon polyps   . History of stomach ulcers   . Hyperlipidemia with target LDL less than 70 07/07/2011  . Metabolic syndrome    Pre-diabetes, hypertension and truncal obesity as well as dyslipidemia  . OSA on CPAP   . Pneumonia "several times"  . Recurrent upper respiratory infection (URI)   . Seizures ([place]) "several"   "last one was ~ 2011" (11/15/2013)  . Shortness of breath dyspnea   . ST elevation myocardial infarction (STEMI) of anterior wall ([place]) 2001   History of -- 2 stents in early and distal mid LAD; prior cardiologist was Dr.  Remi Haggard in [place]  . ST elevation myocardial infarction (STEMI) of inferior wall ([place]) 07/07/2011   Occluded RCA 1.63W46 INTEGRITY; Echo 07/2013: EF 45-50%, Inferior & Posterolateral HK.   Marland Kitchen Umbilical hernia   . Unstable angina ([place]) 2006; 07/2013   Cx-OM - PCI 2.5 mm 13 mm Cypher DES [name], [place]) ; 07/2013: Severe ISR of both prox & Distal LAD stentS --> 2 DES stents (1 at prox edge of the proximal stent, 2nd covers the entire distal stent as well as proximal and distal edge stenose)   . Upper respiratory infection     Current Outpatient Medications  Medication Sig Dispense Refill  . ADVAIR DISKUS 100-50 MCG/DOSE AEPB INHALE ONE PUFF BY MOUTH TWICE A DAY 60 each 2  . albuterol (PROAIR HFA) 108 (90 Base) MCG/ACT inhaler INHALE TWO PUFFS BY MOUTH EVERY 6 HOURS AS NEEDED FOR FOR SHORTNESS OF BREATH 8.5 each 11  . aspirin 81 MG EC tablet Take 1 tablet (81 mg total) by mouth at bedtime. 30 tablet   . clobetasol cream (TEMOVATE) 6.59 % Apply 1 application topically daily as needed (eczema). 30 g 5  . EFFIENT 10 MG TABS tablet TAKE ONE TABLET BY MOUTH DAILY 30  tablet 5  . fluticasone (FLONASE) 50 MCG/ACT nasal spray Place 2 sprays into both nostrils daily. 16 g 0  . meclizine (ANTIVERT) 12.5 MG tablet Take 1 tablet (12.5 mg total) by mouth daily. May take an additional 12.5 mg as needed for dizziness. 30 tablet 0  . metoprolol succinate (TOPROL-XL) 25 MG 24 hr tablet Take 1 tablet (25 mg total) by mouth every evening. 30 tablet 6  . nitroGLYCERIN (NITROSTAT) 0.4 MG SL tablet Place 1 tablet (0.4 mg total) under the tongue every 5 (five) minutes as needed for chest pain. 25 tablet 3  . omeprazole (PRILOSEC) 20 MG capsule TAKE ONE CAPSULE BY MOUTH DAILY 30 capsule 4  . rosuvastatin (CRESTOR) 40 MG tablet Take 1 tablet (40 mg total) by mouth daily. 90 tablet 3   No current facility-administered medications for this visit.     Allergies  Allergen Reactions  . Niacin And Related Hives and  Itching    Family History  Problem Relation Age of Onset  . Coronary artery disease Father   . Heart disease Father   . Stroke Father   . Hypertension Father   . Hyperlipidemia Mother   . Diabetes Mother   . Hyperlipidemia Maternal Grandmother   . Diabetes Maternal Grandmother   . Hyperlipidemia Maternal Grandfather   . Diabetes Maternal Grandfather   . Emphysema Maternal Grandfather   . Heart disease Paternal Grandmother   . Heart disease Paternal Grandfather   . Cancer Maternal Aunt        breast    Social History   Socioeconomic History  . Marital status: Married    Spouse name: Not on file  . Number of children: Not on file  . Years of education: Not on file  . Highest education level: Not on file  Occupational History  . Not on file  Social Needs  . Financial resource strain: Not on file  . Food insecurity:    Worry: Not on file    Inability: Not on file  . Transportation needs:    Medical: Not on file    Non-medical: Not on file  Tobacco Use  . Smoking status: Current Every Day Smoker    Packs/day: 0.50    Years: 40.00    Pack years: 20.00    Types: Cigarettes    Last attempt to quit: 06/19/2013    Years since quitting: 4.5  . Smokeless tobacco: Never Used  . Tobacco comment: Quit with Bupropion 150mg  - > unfortunately, he restarted  Substance and Sexual Activity  . Alcohol use: Yes    Comment: rare  . Drug use: No  . Sexual activity: Not on file  Lifestyle  . Physical activity:    Days per week: Not on file    Minutes per session: Not on file  . Stress: Not on file  Relationships  . Social connections:    Talks on phone: Not on file    Gets together: Not on file    Attends religious service: Not on file    Active member of club or organization: Not on file    Attends meetings of clubs or organizations: Not on file    Relationship status: Not on file  . Intimate partner violence:    Fear of current or ex partner: Not on file    Emotionally  abused: Not on file    Physically abused: Not on file    Forced sexual activity: Not on file  Other Topics Concern  .  Not on file  Social History Narrative   He is a married father of 65, grandfather of 29. He does not really get routine exercise.    He is down to 5 or 6 cigarettes a day. He is very seriously wanting to quit. This is a significant cutback for him, but he has pretty much been told he needs to stop by his wife, and so he fully intends to do so. He is willing to try the patches or whatever. He has a social alcoholic beverage every now and then.      Constitutional: Denies fever, malaise, fatigue, headache or abrupt weight changes.  HEENT: Pt reports decreased hearing in left ear, ear fullness. Denies eye pain, eye redness, ear pain, ringing in the ears, wax buildup, runny nose, nasal congestion, bloody nose, or sore throat. Respiratory: Denies difficulty breathing, shortness of breath, cough or sputum production.    No other specific complaints in a complete review of systems (except as listed in HPI above).  Objective:   Physical Exam    BP 124/78   Pulse 90   Temp 98.2 F (36.8 C) (Oral)   Wt 185 lb (83.9 kg)   SpO2 97%   BMI 25.09 kg/m  Wt Readings from Last 3 Encounters:  12/29/17 185 lb (83.9 kg)  07/01/17 186 lb (84.4 kg)  05/15/17 185 lb (83.9 kg)    General: Appears his stated age, well developed, well nourished in NAD. HEENT: Head: normal shape and size; Ears: bilateral cerumen impaction;   BMET    Component Value Date/Time   NA 140 07/01/2017 1518   NA 142 10/12/2016 1026   NA 139 10/24/2013 1050   K 4.2 07/01/2017 1518   K 3.8 10/24/2013 1050   CL 107 07/01/2017 1518   CL 107 10/24/2013 1050   CO2 24 07/01/2017 1518   CO2 24 10/24/2013 1050   GLUCOSE 94 07/01/2017 1518   GLUCOSE 108 (H) 10/24/2013 1050   BUN 12 07/01/2017 1518   BUN 14 10/12/2016 1026   BUN 8 10/24/2013 1050   CREATININE 0.89 07/01/2017 1518   CREATININE 1.01 05/13/2016  1044   CALCIUM 9.6 07/01/2017 1518   CALCIUM 8.2 (L) 10/24/2013 1050   GFRNONAA 95 10/12/2016 1026   GFRNONAA >60 10/24/2013 1050   GFRAA 110 10/12/2016 1026   GFRAA >60 10/24/2013 1050    Lipid Panel     Component Value Date/Time   CHOL 143 07/01/2017 1518   CHOL 149 09/03/2016 0950   TRIG 237.0 (H) 07/01/2017 1518   HDL 35.60 (L) 07/01/2017 1518   HDL 34 (L) 09/03/2016 0950   CHOLHDL 4 07/01/2017 1518   VLDL 47.4 (H) 07/01/2017 1518   LDLCALC 66 09/03/2016 0950    CBC    Component Value Date/Time   WBC 15.6 (H) 07/01/2017 1518   RBC 5.70 07/01/2017 1518   HGB 17.2 (H) 07/01/2017 1518   HGB 17.5 10/12/2016 1026   HCT 50.2 07/01/2017 1518   HCT 50.1 10/12/2016 1026   PLT 346.0 07/01/2017 1518   PLT 376 10/12/2016 1026   MCV 88.0 07/01/2017 1518   MCV 89 10/12/2016 1026   MCV 89 10/24/2013 1050   MCH 30.9 10/12/2016 1026   MCH 30.4 05/13/2016 1044   MCHC 34.2 07/01/2017 1518   RDW 14.3 07/01/2017 1518   RDW 13.8 10/12/2016 1026   RDW 14.5 10/24/2013 1050   LYMPHSABS 4.6 (H) 04/04/2015 1040   LYMPHSABS 2.8 11/07/2013 1509   MONOABS 0.9 04/04/2015  1040   EOSABS 0.1 04/04/2015 1040   EOSABS 0.2 11/07/2013 1509   BASOSABS 0.1 04/04/2015 1040   BASOSABS 0.1 11/07/2013 1509    Hgb A1C Lab Results  Component Value Date   HGBA1C 6.0 07/01/2017          Assessment & Plan:   Hearing Loss, Left Ear, Bilateral Cerumen Impaction:  Manual lavage by CMA Advised him to try Debrox 2 x week to prevent wax buildup  Return precautions discussed Webb Silversmith, NP

## 2017-12-29 NOTE — Patient Instructions (Signed)
Earwax Buildup, Adult The ears produce a substance called earwax that helps keep bacteria out of the ear and protects the skin in the ear canal. Occasionally, earwax can build up in the ear and cause discomfort or hearing loss. What increases the risk? This condition is more likely to develop in people who:  Are male.  Are elderly.  Naturally produce more earwax.  Clean their ears often with cotton swabs.  Use earplugs often.  Use in-ear headphones often.  Wear hearing aids.  Have narrow ear canals.  Have earwax that is overly thick or sticky.  Have eczema.  Are dehydrated.  Have excess hair in the ear canal.  What are the signs or symptoms? Symptoms of this condition include:  Reduced or muffled hearing.  A feeling of fullness in the ear or feeling that the ear is plugged.  Fluid coming from the ear.  Ear pain.  Ear itch.  Ringing in the ear.  Coughing.  An obvious piece of earwax that can be seen inside the ear canal.  How is this diagnosed? This condition may be diagnosed based on:  Your symptoms.  Your medical history.  An ear exam. During the exam, your health care provider will look into your ear with an instrument called an otoscope.  You may have tests, including a hearing test. How is this treated? This condition may be treated by:  Using ear drops to soften the earwax.  Having the earwax removed by a health care provider. The health care provider may: ? Flush the ear with water. ? Use an instrument that has a loop on the end (curette). ? Use a suction device.  Surgery to remove the wax buildup. This may be done in severe cases.  Follow these instructions at home:  Take over-the-counter and prescription medicines only as told by your health care provider.  Do not put any objects, including cotton swabs, into your ear. You can clean the opening of your ear canal with a washcloth or facial tissue.  Follow instructions from your health  care provider about cleaning your ears. Do not over-clean your ears.  Drink enough fluid to keep your urine clear or pale yellow. This will help to thin the earwax.  Keep all follow-up visits as told by your health care provider. If earwax builds up in your ears often or if you use hearing aids, consider seeing your health care provider for routine, preventive ear cleanings. Ask your health care provider how often you should schedule your cleanings.  If you have hearing aids, clean them according to instructions from the manufacturer and your health care provider. Contact a health care provider if:  You have ear pain.  You develop a fever.  You have blood, pus, or other fluid coming from your ear.  You have hearing loss.  You have ringing in your ears that does not go away.  Your symptoms do not improve with treatment.  You feel like the room is spinning (vertigo). Summary  Earwax can build up in the ear and cause discomfort or hearing loss.  The most common symptoms of this condition include reduced or muffled hearing and a feeling of fullness in the ear or feeling that the ear is plugged.  This condition may be diagnosed based on your symptoms, your medical history, and an ear exam.  This condition may be treated by using ear drops to soften the earwax or by having the earwax removed by a health care provider.  Do   not put any objects, including cotton swabs, into your ear. You can clean the opening of your ear canal with a washcloth or facial tissue. This information is not intended to replace advice given to you by your health care provider. Make sure you discuss any questions you have with your health care provider. Document Released: 03/04/2004 Document Revised: 04/07/2016 Document Reviewed: 04/07/2016 Elsevier Interactive Patient Education  2018 [employer]  

## 2018-01-02 ENCOUNTER — Telehealth: Payer: Self-pay | Admitting: Internal Medicine

## 2018-01-02 NOTE — Telephone Encounter (Signed)
Flonase 1 spray BID x 1 week. Can alternate with nasal saline if too drying.

## 2018-01-02 NOTE — Telephone Encounter (Signed)
Pt stated he still can't hear out of left ear and want to know what does he need to do next.

## 2018-01-02 NOTE — Telephone Encounter (Signed)
Pt is aware as instructed and expressed understanding 

## 2018-01-08 ENCOUNTER — Other Ambulatory Visit: Payer: Self-pay | Admitting: Cardiovascular Disease

## 2018-01-08 ENCOUNTER — Other Ambulatory Visit: Payer: Self-pay | Admitting: Internal Medicine

## 2018-01-10 ENCOUNTER — Other Ambulatory Visit: Payer: Self-pay

## 2018-01-10 MED ORDER — EFFIENT 10 MG PO TABS: ORAL_TABLET | ORAL | 0 refills | Status: DC

## 2018-01-11 ENCOUNTER — Encounter

## 2018-01-11 ENCOUNTER — Encounter: Payer: Self-pay | Admitting: Cardiology

## 2018-01-11 ENCOUNTER — Ambulatory Visit: Payer: PPO | Admitting: Cardiology

## 2018-01-11 VITALS — BP 114/80 | HR 101 | Ht 71.0 in | Wt 186.0 lb

## 2018-01-11 DIAGNOSIS — E785 Hyperlipidemia, unspecified: Secondary | ICD-10-CM | POA: Diagnosis not present

## 2018-01-11 DIAGNOSIS — I25119 Atherosclerotic heart disease of native coronary artery with unspecified angina pectoris: Secondary | ICD-10-CM

## 2018-01-11 DIAGNOSIS — I200 Unstable angina: Secondary | ICD-10-CM

## 2018-01-11 DIAGNOSIS — I5189 Other ill-defined heart diseases: Secondary | ICD-10-CM | POA: Diagnosis not present

## 2018-01-11 DIAGNOSIS — I255 Ischemic cardiomyopathy: Secondary | ICD-10-CM | POA: Diagnosis not present

## 2018-01-11 DIAGNOSIS — I209 Angina pectoris, unspecified: Secondary | ICD-10-CM | POA: Diagnosis not present

## 2018-01-11 DIAGNOSIS — I5042 Chronic combined systolic (congestive) and diastolic (congestive) heart failure: Secondary | ICD-10-CM

## 2018-01-11 DIAGNOSIS — Z01818 Encounter for other preprocedural examination: Secondary | ICD-10-CM | POA: Diagnosis not present

## 2018-01-11 MED ORDER — ISOSORBIDE MONONITRATE ER 30 MG PO TB24: 30.0000 mg | ORAL_TABLET | Freq: Every day | ORAL | 6 refills | Status: DC

## 2018-01-11 MED ORDER — METOPROLOL SUCCINATE ER 25 MG PO TB24: 37.5000 mg | ORAL_TABLET | Freq: Every day | ORAL | 6 refills | Status: DC

## 2018-01-11 NOTE — Progress Notes (Signed)
PCP: Jearld Fenton, NP  Clinic Note: Chief Complaint  Patient presents with  . Follow-up    annual  . Coronary Artery Disease  . Shortness of Breath  . Chest Pain   HPI: Troy Parrish is a 57 y.o. male with a PMH notable for MV CAD (multiple STEMIs & PCIs) with ICM who presents today for 12 month.  He has a very significant multivessel coronary disease history with at least 3 STEMI's and at least 3 if not 4 episodes of progressive /unstable angina leading to additional PCI (now all 3 major arteries s/p PCI). He also has very difficult/poorly controlled lipids. He intermittently goes back to smoking, but is now in the process of trying to complete his quitting.  Recent Hospitalizations:   None.  Studies Personally Reviewed - (if available, images/films reviewed: From Epic Chart or Care Everywhere)  None  He was last seen in late Nov 2018 - f/u for ~syncopal episode. No further Sx.  NO palpitations.   Interval History: Miklos presents today stating that really up until about 6 weeks ago he been doing great after his last PCI.  He had been hiking and exercising.  He had [place] the [place] and hiking in the [place].  He was doing outstanding, but over the last 6+ weeks he is been starting to notice worsening exertional shortness of breath and chest tightness pressure.  He also notes his heart rate going up easier than usual.  He therefore has been less active over the last 6 weeks to the point that he is actually really only able to stay around the house.  Even going out to the store is a chore for him.  His symptoms are always exertional and not at rest, but are now occurring with more frequency, less exertion and more intensity.  This is very similar to his previous anginal symptoms.  He has a longstanding history and is quite in tune with his symptoms. His blood pressures have been fine, but he is noted his heart rate going up not so much irregular just faster than  usual with exertion. No heart failure symptoms of PND, orthopnea or edema.  No syncope/near syncope or TIA/amaurosis fugax.  He remains on aspirin and Effient and has not had no melena, hematochezia, hematuria or epistaxis.  He also denies any claudication.  He is happy to announce that he has now been successful with smoking cessation for almost a year.  ROS: A comprehensive was performed. Review of Systems  Constitutional: Negative for malaise/fatigue (Energy is better).  HENT: Positive for congestion. Negative for nosebleeds.   Respiratory: Positive for cough and wheezing. Negative for shortness of breath.   Gastrointestinal: Negative for blood in stool, constipation and heartburn.  Genitourinary: Negative for dysuria and frequency.  Musculoskeletal: Negative for back pain, falls, joint pain and myalgias.  Skin: Negative.   Neurological: Positive for dizziness.  Endo/Heme/Allergies: Positive for environmental allergies. Does not bruise/bleed easily (But not overly worrisome).  Psychiatric/Behavioral: Negative for depression and memory loss. The patient is not nervous/anxious.        Better now, but was extremely stressed around the time of his daughter's wedding.  Being around his family is very stressful.  All other systems reviewed and are negative.  I have reviewed and (if needed) personally updated the patient's problem list, medications, allergies, past medical and surgical history, social and family history.   Past Medical History:  Diagnosis Date  . Bronchitis  Gets bronchitis almost every year  . CAD S/P percutaneous coronary angioplasty 06/2011; 6/ & 11/2013   S/P PCI to all 3 major vessels;a)  Ant STEMI 2001- BMS-LAD x2 -->(redo PCI 6/'15 -- pLAD Xience DES 2.5 x 12- 2.75 mm, mLAD 2.25 x 12 - 2.7 mm), b) '06 UA --> Cx- OM DES; c) 2013 Inf MI BMS mRCA --> d) 10/'15 PCI dRCA Promus P DES 4.0 x 16 (4.25 mm); PTCA of RPL2 (2.0 mm) &RPDA (2.25 mm) 11/2013; 4/18 PCI OM1 Synergy DES   2.5 x 16  . COPD (chronic obstructive pulmonary disease) ([place])   . Elevated WBC count   . Emphysema of lung ([place])   . Essential hypertension 07/07/2011  . Former heavy tobacco smoker     quit in May 2015 after multiple attempts at trying to quit before   . GERD (gastroesophageal reflux disease)   . Glucose intolerance (pre-diabetes) June 2015    hemoglobin A1c 6.6  . Headache    "Imdur related; stopped taking it; headaches went away" (11/15/2013)  . Heart murmur   . History of colon polyps   . History of stomach ulcers   . Hyperlipidemia with target LDL less than 70 07/07/2011  . Metabolic syndrome    Pre-diabetes, hypertension and truncal obesity as well as dyslipidemia  . OSA on CPAP   . Pneumonia "several times"  . Recurrent upper respiratory infection (URI)   . Seizures ([place]) "several"   "last one was ~ 2011" (11/15/2013)  . Shortness of breath dyspnea   . ST elevation myocardial infarction (STEMI) of anterior wall ([place]) 2001   History of -- 2 stents in early and distal mid LAD; prior cardiologist was Dr. Remi Haggard in [place]  . ST elevation myocardial infarction (STEMI) of inferior wall ([place]) 07/07/2011   Occluded RCA 6.81E75 INTEGRITY; Echo 07/2013: EF 45-50%, Inferior & Posterolateral HK.   Marland Kitchen Umbilical hernia   . Unstable angina ([place]) 2006; 07/2013   Cx-OM - PCI 2.5 mm 13 mm Cypher DES [name], [place]) ; 07/2013: Severe ISR of both prox & Distal LAD stentS --> 2 DES stents (1 at prox edge of the proximal stent, 2nd covers the entire distal stent as well as proximal and distal edge stenose)   . Upper respiratory infection     Past Surgical History:  Procedure Laterality Date  . Cardiac MRI [facility]  09/2014   EF 51%. Mod HK of basal-mid Inf wall, mild HK of basal inferoseptum & basal-mid inferolateral wall with hyper enhancement (c/w subendocard RCA MI with significant viability), focal hyper enhancement of apical wall c/w dLAD subendocard MI & complete LAD viability. . No  aortic stenosis. Normal RV function. No Ischemia on Adenosine Stress.  . COLONOSCOPY WITH PROPOFOL N/A 03/28/2015   Procedure: COLONOSCOPY WITH PROPOFOL;  Surgeon: Lucilla Lame, MD;  Location: [place];  Service: Endoscopy;  Laterality: N/A;  . CORONARY ANGIOPLASTY WITH STENT PLACEMENT  2001   ANTERIOR mi with a  PCI to LAD; [place], [place] - Dr. Dorris Fetch  . CORONARY ANGIOPLASTY WITH STENT PLACEMENT  2006   [place] by Dr Dorris Fetch -lesion lft circ/OM1 with 2.5 x 76m Cypher DES  . CORONARY STENT INTERVENTION N/A 05/19/2016   Procedure: Coronary Stent Intervention;  Surgeon: DLeonie Man MD;  [facility];  Service: Cardiovascular: PCI pOM1 - Synergy DES 2.5 x 16 (overlaps old stent)  . ESOPHAGOGASTRODUODENOSCOPY (EGD) WITH PROPOFOL N/A 03/28/2015   Procedure: ESOPHAGOGASTRODUODENOSCOPY (EGD) WITH PROPOFOL;  Surgeon:  [place]illa Lame, MD;  Location: [place];  Service: Endoscopy;  Laterality: N/A;  . INSERTION OF MESH N/A 04/02/2015   Procedure: INSERTION OF MESH;  Surgeon: Florene Glen, MD;  Location: [facility];  Service: General;  Laterality: N/A;  . LEFT AND RIGHT HEART CATHETERIZATION WITH CORONARY ANGIOGRAM N/A 07/30/2013   Procedure: LEFT AND RIGHT HEART CATHETERIZATION WITH CORONARY ANGIOGRAM;  Surgeon: Leonie Man, MD;  Location: [facility] CATH LAB;  Service: Cardiovascular;  2 separate P & mLAD lesions -> PCI, MOd-Severe dRCA disesase with diffuse RPL/PDA disease (FFR)  . LEFT HEART CATH  08/03/2013   Procedure: LEFT HEART CATH;  Surgeon: Leonie Man, MD;  Location: [facility] C[facility] LAB;  Service: Cardiovascular;;FFR of RCA - non-flow-limiting  . LEFT HEART CATH AND CORONARY ANGIOGRAPHY N/A 05/19/2016   Procedure: Left Heart Cath and Coronary Angiography;  Surgeon: Leonie Man, MD;  Location: [place] [facility];  Service: Cardiovascular: CULPRIT - 85% pOM1 pre-stent. Stents in both proximal and distal RCA widely patent. PTCA sites in RPL & PDA widely patent with  stable 70% small RPL 2. Extensive stenting in the LAD widely patent stents with mild to mod Dz btw prox & mid-distal stents.   Marland Kitchen LEFT HEART CATHETERIZATION WITH CORONARY ANGIOGRAM N/A 07/07/2011   Procedure: LEFT HEART CATHETERIZATION WITH CORONARY ANGIOGRAM;  Surgeon: Leonie Man, MD;  Location: [facility] CATH LAB;  Service: Cardiovascular;  inferolat post LV infarct ST-elevation MI  . MET/CPET  11/09/2011   submax. effort 1.05 RER peak V02 was 51% ,chronotropic incomp.hrt rate lows 80s to 100  . MET/CPET  September 2016   [facility]: Mild functional impairment due primarily to mild pulmonary and circulatory limitations. Also suggest physical deconditioning (seen with low-normal aerobic reserve). Mild VQ mismatch with exercise.  Ventilatory reserve exhausted with peak exercise demonstrated pulmonary limitation. No significant decrease in postexercise FEV1 compared to rest --> suggest no exercise induced bronchospasm  . NM MYOVIEW ([place] HX)  11/30/2011   EF 45% ,exercise 10 METS, infarct\scar w mild perinfarct ischemia -basal inferolat and mid inferolat region  . NM MYOVIEW LTD  04/25/2016   Exercised for 9 minutes. Reached 90% max. Heart rate. 9.4 METS. Read as a low risk study with normal EF 55-65%. -> on Review -  there does appear to be a small to moderate sized, medium severtiy reversible perfusion defect in the anterior wall - read by computer, but not noted by reader.  Marland Kitchen PERCUTANEOUS CORONARY STENT INTERVENTION (PCI-S)  07/07/2011   Procedure: PERCUTANEOUS CORONARY STENT INTERVENTION (PCI-S);  Surgeon: Leonie Man, MD;  Location: [facility] CATH LAB;  Service: Cardiovascular;;occluded RCA ,Integrity Resolute DES 2.75 x 18 mm stent - post-dilated 3.1 mm  . PERCUTANEOUS CORONARY STENT INTERVENTION (PCI-S)  07/30/2013   Procedure: PERCUTANEOUS CORONARY STENT INTERVENTION (PCI-S);  Surgeon: Leonie Man, MD;  Location: [facility] CATH LAB;  Service: Cardiovascular;;Distal mid LAD: Xience Alpine DES 2.25 mm x 28 mm  (overlapping proximal and distal edge of previous stent) - 2.7 mm; proximal LAD 2.5 mm x 12 mm Xience Alpine DES (2.8 mm);; RCA 60-70% stenosis planned staged procedure  . PERCUTANEOUS CORONARY STENT INTERVENTION (PCI-S) N/A 11/15/2013   Procedure: PERCUTANEOUS CORONARY STENT INTERVENTION (PCI-S);  Surgeon: Leonie Man, MD;  Location: [facility] CATH LAB;  Service: Cardiovascular;  Patent Cx & LAD Stents; PCI -dRCA Promus Premier DES 4.0 mm x 16 mm (4.25 mm) dRCA, 2.0 mm Cutting PTCA of RPL2 [place] & POBA of mRPDA  . POLYPECTOMY  03/28/2015   Procedure: POLYPECTOMY;  Surgeon: [place] Lame, MD;  Location: [place];  Service: Endoscopy;;  . RIGHT HEART CATH  June 2015   Normal pressures with severely reduced cardiac output and index. (3.25/1.55)  . TRANSTHORACIC ECHOCARDIOGRAM  07/31/2013; February 2017   a. LVEF 45-50. Inferior posterior hypokinesis with mild dilation. Apparent normal diastolic pressures;   . UMBILICAL HERNIA REPAIR N/A 04/02/2015   Procedure: HERNIA REPAIR UMBILICAL ADULT;  Surgeon: Florene Glen, MD;  Location: [facility];  Service: General;  Laterality: N/A;   Pre-Post Diagrams - 05/2016; OM1 PCI: SYNERGY DES 2.5X16 -overlaps OLD stent.        Current Meds  Medication Sig  . ADVAIR DISKUS 100-50 MCG/DOSE AEPB INHALE ONE PUFF BY MOUTH TWICE A DAY  . albuterol (PROAIR HFA) 108 (90 Base) MCG/ACT inhaler INHALE TWO PUFFS BY MOUTH EVERY 6 HOURS AS NEEDED FOR FOR SHORTNESS OF BREATH  . aspirin 81 MG EC tablet Take 1 tablet (81 mg total) by mouth at bedtime.  . clobetasol cream (TEMOVATE) 0.05 % APPLY TOPICALLY TO AFFECTED AREA(S) DAILY AS NEEDED FOR ECZEMA  . EFFIENT 10 MG TABS tablet TAKE ONE TABLET BY MOUTH DAILY  . EFFIENT 10 MG TABS tablet TAKE ONE TABLET BY MOUTH DAILY  . fluticasone (FLONASE) 50 MCG/ACT nasal spray Place 2 sprays into both nostrils daily.  . meclizine (ANTIVERT) 12.5 MG tablet Take 1 tablet (12.5 mg total) by mouth daily. May take an additional 12.5 mg  as needed for dizziness.  . nitroGLYCERIN (NITROSTAT) 0.4 MG SL tablet Place 1 tablet (0.4 mg total) under the tongue every 5 (five) minutes as needed for chest pain.  Marland Kitchen omeprazole (PRILOSEC) 20 MG capsule TAKE ONE CAPSULE BY MOUTH DAILY  . rosuvastatin (CRESTOR) 40 MG tablet Take 1 tablet (40 mg total) by mouth daily.  . [DISCONTINUED] metoprolol succinate (TOPROL-XL) 25 MG 24 hr tablet Take 1 tablet (25 mg total) by mouth every evening.  -- not taking Amlodipine.    Allergies  Allergen Reactions  . Niacin And Related Hives and Itching   Social History   Tobacco Use  . Smoking status: Former Smoker    Packs/day: 0.50    Years: 40.00    Pack years: 20.00    Types: Cigarettes    Last attempt to quit: 01/19/2017    Years since quitting: 0.9  . Smokeless tobacco: Never Used  . Tobacco comment: Quit with Bupropion '150mg'$  - > unfortunately, he restarted  Substance Use Topics  . Alcohol use: Yes    Comment: rare  . Drug use: No   Social History   Social History Narrative   He is a married father of 60, grandfather of 17. He does not really get routine exercise.    He is down to 5 or 6 cigarettes a day. He is very seriously wanting to quit. This is a significant cutback for him, but he has pretty much been told he needs to stop by his wife, and so he fully intends to do so. He is willing to try the patches or whatever. He has a social alcoholic beverage every now and then.     family history includes Cancer in his maternal aunt; Coronary artery disease in his father; Diabetes in his maternal grandfather, maternal grandmother, and mother; Emphysema in his maternal grandfather; Heart disease in his father, paternal grandfather, and paternal grandmother; Hyperlipidemia in his maternal grandfather, maternal grandmother, and mother; Hypertension in his father; Stroke in his father.  Wt Readings from Last 3 Encounters:  01/11/18 186 lb (84.4 kg)  12/29/17 185 lb (83.9 kg)  07/01/17 186 lb  (84.4 kg)   PHYSICAL EXAM BP 114/80 (BP Location: Left Arm, Patient Position: Sitting, Cuff Size: Normal)   Pulse (!) 101   Ht '5\' 11"'$  (1.803 m)   Wt 186 lb (84.4 kg)   BMI 25.94 kg/m   Physical Exam  Constitutional: He is oriented to person, place, and time. He appears well-developed and well-nourished. No distress.  Healthy-appearing.  Well-groomed.  In good spirits today.  HENT:  Head: Normocephalic and atraumatic.  Mouth/Throat: No oropharyngeal exudate.  Eyes: EOM are normal. No scleral icterus.  Neck: Normal range of motion. Neck supple. No hepatojugular reflux and no JVD present. Carotid bruit is present (Cannot exclude soft bilateral bruits.  May be because of breathing).  Cardiovascular: Normal rate, regular rhythm, normal heart sounds and intact distal pulses. Exam reveals no gallop and no friction rub.  No murmur heard. Nondisplaced PMI  Pulmonary/Chest: Effort normal. No respiratory distress. He has no wheezes. He has no rales.  Mild diffuse interstitial sounds.  But no rales or rhonchi  Abdominal: Soft. Bowel sounds are normal. He exhibits no distension. There is no tenderness.  Musculoskeletal: Normal range of motion. He exhibits no edema.  Neurological: He is alert and oriented to person, place, and time. No cranial nerve deficit (Grossly intact).  Skin: Skin is warm and dry. No rash noted. No erythema.  Psychiatric: He has a normal mood and affect. His behavior is normal. Thought content normal.  Nursing note and vitals reviewed.   Adult ECG Report none  Other studies Reviewed: Additional studies/ records that were reviewed today include:  Recent Labs:    Lab Results  Component Value Date   CHOL WILL FOLLOW 01/12/2018   HDL WILL FOLLOW 01/12/2018   [place] WILL FOLLOW 01/12/2018   LDLDIRECT 78.0 07/01/2017   TRIG WILL FOLLOW 01/12/2018   CHOLHDL WILL FOLLOW 01/12/2018    ASSESSMENT / PLAN: Problem List Items Addressed This Visit    Angina, class III  ([place])    Has been doing very very well since his last PCI up until about 6 weeks ago.  He is now starting to notice increasing amounts of exertional dyspnea and chest tightness consistent with his prior angina.  Over the last couple weeks the discomfort may be class II sounding to class III.  This is consistent with every single time we have had to do some type of intervention. Plan: Relook cardiac catheterization with possible PCI. Will restart Imdur 30 mg daily and increase Toprol to 37.5 mg daily.      Relevant Medications   isosorbide mononitrate (IMDUR) 30 MG 24 hr tablet   metoprolol succinate (TOPROL XL) 25 MG 24 hr tablet   Other Relevant Orders   EKG 12-Lead (Completed)   LEFT HEART CATHETERIZATION WITH CORONARY ANGIOGRAM   CBC (Completed)   Basic metabolic panel (Completed)   Cardiomyopathy, ischemic; EF~45% with hypokinesis of the inferior and inferolateral myocardium; CO 3.25 (Chronic)    Need to exclude CHF as reason for symptoms, but I suspect this is probably more ischemic in nature.  Euvolemic on exam.  Borderline blood pressures 1 allow [place] to titrate up any further blood pressure medication besides slight increase in Toprol.      Relevant Medications   isosorbide mononitrate (IMDUR) 30 MG 24 hr tablet   metoprolol succinate (TOPROL XL) 25 MG 24 hr tablet   Chronic  combined systolic and diastolic congestive heart failure, NYHA class 2 (HCC) - Primary (Chronic)   Relevant Medications   isosorbide mononitrate (IMDUR) 30 MG 24 hr tablet   metoprolol succinate (TOPROL XL) 25 MG 24 hr tablet   Other Relevant Orders   EKG 12-Lead (Completed)   Chronotropic incompetence with left ventricular dysfunction --> improved with reduction of beta blocker (Chronic)    Seems to be doing relatively well on 25 mg Toprol.  With his worsening anginal symptoms on increased to 37.5 mg until cath.  If tolerating, may actually continue that dose.      Relevant Medications   isosorbide  mononitrate (IMDUR) 30 MG 24 hr tablet   metoprolol succinate (TOPROL XL) 25 MG 24 hr tablet   Coronary artery disease involving native coronary artery of native heart with angina pectoris (HCC) (Chronic)    Yet again having anginal symptoms.  He made it over a year since his last PCI.  Unfortunately we have had to wean off a lot of his antianginal medications. Plan: Reinstitute low-dose Imdur and increased Toprol dose. He is already on Effient without aspirin.  We will restart aspirin 81 mg. He is on Crestor and tolerating relatively well.  Concerned about possible in-stent restenosis, but each time of cath him it is really been a de novo lesion. Plan cardiac catheterization with possible PCI, I will see him back in follow-up.       Relevant Medications   isosorbide mononitrate (IMDUR) 30 MG 24 hr tablet   metoprolol succinate (TOPROL XL) 25 MG 24 hr tablet   Hyperlipidemia with target LDL less than 70; by his report he is back on Crestor (Chronic)    Due for follow-up lipids.  Last lipids in May showed LDL 78.  He had just gotten back on Crestor at that time.  I would like to see what it looks like now.  Can check on the morning of cath.      Relevant Medications   isosorbide mononitrate (IMDUR) 30 MG 24 hr tablet   metoprolol succinate (TOPROL XL) 25 MG 24 hr tablet   Other Relevant Orders   Lipid panel (Completed)   Progressive angina (HCC)    Progressive nature of angina increased from class I all the way up to her class III over the last 6 weeks.  Is the point now where he really cannot do much of anything besides walking around the house. Any type of exertion leads to dyspnea and chest pain which is new for him since his last PCI but very familiar comparison to his previous episodes.  Plan: Cardiac cath with possible PCI, titrate up medications.      Relevant Medications   isosorbide mononitrate (IMDUR) 30 MG 24 hr tablet   metoprolol succinate (TOPROL XL) 25 MG 24 hr tablet     Other Relevant Orders   EKG 12-Lead (Completed)   LEFT HEART CATHETERIZATION WITH CORONARY ANGIOGRAM   CBC (Completed)   Basic metabolic panel (Completed)    Other Visit Diagnoses    Pre-op testing       Relevant Orders   LEFT HEART CATHETERIZATION WITH CORONARY ANGIOGRAM   CBC (Completed)     Current medicines are reviewed at length with the patient today. (+/- concerns) none The following changes have been made:Restart aspirin 81 mg, increase beta-blocker and start Imdur.  Patient Instructions  Medication Instructions:   INCREASE  METOPROLOL SUCC /TOPROL XL 37.5 MG   (1 AND 1/2 ) TABLET DAILY  START ISOSORBIDE  MN 30 MG DAILY  If you need a refill on your cardiac medications before your next appointment, please call your pharmacy.   Lab work:  BMP  CBC LIPID DO NOT EAT OR DRINK THE DAY OF TEST  If you have labs (blood work) drawn today and your tests are completely normal, you will receive your results only by: Marland Kitchen MyChart Message (if you have MyChart) OR . A paper copy in the mail If you have any lab test that is abnormal or we need to change your treatment, we will call you to review the results.  Testing/Procedures: SCHEDULE FOR DEC 17 , 2019 -  Your physician has requested that you have a cardiac catheterization.   Follow-Up: At [facility], you and your health needs are our priority.  As part of our continuing mission to provide you with exceptional heart care, we have created designated Provider Care Teams.  These Care Teams include your primary Cardiologist (physician) and Advanced Practice Providers (APPs -  Physician Assistants and Nurse Practitioners) who all work together to provide you with the care you need, when you need it. . Your physician recommends that you schedule a follow-up appointment in: JAN 10 ,2020 AT 2:20 PM   Studies Ordered:   Orders Placed This Encounter  Procedures  . CBC  . Basic metabolic panel  . Lipid panel  . EKG 12-Lead   . LEFT HEART CATHETERIZATION WITH CORONARY Illene Silver, M.D., M.S. Interventional Cardiologist   Pager # [phone] Phone # [phone] [address] [place], [place] [address]

## 2018-01-11 NOTE — Patient Instructions (Addendum)
Medication Instructions:   INCREASE  METOPROLOL SUCC /TOPROL XL 37.5 MG   (1 AND 1/2 ) TABLET DAILY  START ISOSORBIDE  MN 30 MG DAILY   If you need a refill on your cardiac medications before your next appointment, please call your pharmacy.   Lab work:  BMP  CBC LIPID DO NOT EAT OR DRINK THE DAY OF TEST  If you have labs (blood work) drawn today and your tests are completely normal, you will receive your results only by: Marland Kitchen MyChart Message (if you have MyChart) OR . A paper copy in the mail If you have any lab test that is abnormal or we need to change your treatment, we will call you to review the results.  Testing/Procedures: SCHEDULE FOR DEC 17 , 2019 -  Your physician has requested that you have a cardiac catheterization. Cardiac catheterization is used to diagnose and/or treat various heart conditions. Doctors may recommend this procedure for a number of different reasons. The most common reason is to evaluate chest pain. Chest pain can be a symptom of coronary artery disease (CAD), and cardiac catheterization can show whether plaque is narrowing or blocking your heart's arteries. This procedure is also used to evaluate the valves, as well as measure the blood flow and oxygen levels in different parts of your heart. For further information please visit [email or web address]. Please follow instruction sheet, as given.    Follow-Up: At [facility], you and your health needs are our priority.  As part of our continuing mission to provide you with exceptional heart care, we have created designated Provider Care Teams.  These Care Teams include your primary Cardiologist (physician) and Advanced Practice Providers (APPs -  Physician Assistants and Nurse Practitioners) who all work together to provide you with the care you need, when you need it. . Your physician recommends that you schedule a follow-up appointment in: JAN 10 ,2020 AT 2:20 PM .   Any Other Special Instructions Will Be  Listed Below (If Applicable).        [place] [address] Dept: [phone] Loc: [phone]  Troy Parrish  01/11/2018  You are scheduled for a Cardiac Catheterization on Tuesday, December 17 with Dr. Glenetta Hew.  1. Please arrive at the [facility] (Main Entrance A) at [facility]: [address] [place], [place] [address] at 7:00 AM (This time is two hours before your procedure to ensure your preparation). Free valet parking service is available.   Special note: Every effort is made to have your procedure done on time. Please understand that emergencies sometimes delay scheduled procedures.  2. Diet: Do not eat solid foods after midnight.  The patient may have clear liquids until 5am upon the day of the procedure.  3. Labs   PLEASE DO TOMORROW AT [facility]  4. Medication instructions in preparation for your procedure:   On the morning of your procedure, take your Aspirin  325 MG and Effient/Prasugrel and any morning medicines NOT listed above.  You may use sips of water.  5. Plan for one night stay--bring personal belongings. 6. Bring a current list of your medications and current insurance cards. 7. You MUST have a responsible person to drive you home. 8. Someone MUST be with you the first 24 hours after you arrive home or your discharge will be delayed. 9. Please wear clothes that are easy to get on and off and wear slip-on  shoes.  Thank you for allowing [place] to care for you!   -- [facility]

## 2018-01-12 ENCOUNTER — Encounter: Payer: Self-pay | Admitting: Cardiology

## 2018-01-12 DIAGNOSIS — I209 Angina pectoris, unspecified: Secondary | ICD-10-CM | POA: Diagnosis not present

## 2018-01-12 DIAGNOSIS — E785 Hyperlipidemia, unspecified: Secondary | ICD-10-CM | POA: Diagnosis not present

## 2018-01-12 DIAGNOSIS — Z01818 Encounter for other preprocedural examination: Secondary | ICD-10-CM | POA: Diagnosis not present

## 2018-01-12 DIAGNOSIS — I200 Unstable angina: Secondary | ICD-10-CM | POA: Diagnosis not present

## 2018-01-12 NOTE — Assessment & Plan Note (Signed)
Need to exclude CHF as reason for symptoms, but I suspect this is probably more ischemic in nature.  Euvolemic on exam.  Borderline blood pressures 1 allow [place] to titrate up any further blood pressure medication besides slight increase in Toprol.

## 2018-01-12 NOTE — Assessment & Plan Note (Signed)
Due for follow-up lipids.  Last lipids in May showed LDL 78.  He had just gotten back on Crestor at that time.  I would like to see what it looks like now.  Can check on the morning of cath.

## 2018-01-12 NOTE — Assessment & Plan Note (Signed)
Has been doing very very well since his last PCI up until about 6 weeks ago.  He is now starting to notice increasing amounts of exertional dyspnea and chest tightness consistent with his prior angina.  Over the last couple weeks the discomfort may be class II sounding to class III.  This is consistent with every single time we have had to do some type of intervention. Plan: Relook cardiac catheterization with possible PCI. Will restart Imdur 30 mg daily and increase Toprol to 37.5 mg daily.

## 2018-01-12 NOTE — Assessment & Plan Note (Signed)
Seems to be doing relatively well on 25 mg Toprol.  With his worsening anginal symptoms on increased to 37.5 mg until cath.  If tolerating, may actually continue that dose.

## 2018-01-12 NOTE — Assessment & Plan Note (Signed)
Progressive nature of angina increased from class I all the way up to her class III over the last 6 weeks.  Is the point now where he really cannot do much of anything besides walking around the house. Any type of exertion leads to dyspnea and chest pain which is new for him since his last PCI but very familiar comparison to his previous episodes.  Plan: Cardiac cath with possible PCI, titrate up medications.

## 2018-01-12 NOTE — Assessment & Plan Note (Signed)
Yet again having anginal symptoms.  He made it over a year since his last PCI.  Unfortunately we have had to wean off a lot of his antianginal medications. Plan: Reinstitute low-dose Imdur and increased Toprol dose. He is already on Effient without aspirin.  We will restart aspirin 81 mg. He is on Crestor and tolerating relatively well.  Concerned about possible in-stent restenosis, but each time of cath him it is really been a de novo lesion. Plan cardiac catheterization with possible PCI, I will see him back in follow-up.

## 2018-01-13 LAB — BASIC METABOLIC PANEL
BUN/Creatinine Ratio: 11 (ref 9–20)
BUN: 10 mg/dL (ref 6–24)
CO2: 18 mmol/L — ABNORMAL LOW (ref 20–29)
Calcium: 9.1 mg/dL (ref 8.7–10.2)
Chloride: 107 mmol/L — ABNORMAL HIGH (ref 96–106)
Creatinine, Ser: 0.9 mg/dL (ref 0.76–1.27)
GFR calc Af Amer: 109 mL/min/{1.73_m2} (ref >59)
GFR calc non Af Amer: 94 mL/min/{1.73_m2} (ref >59)
Glucose: 131 mg/dL — ABNORMAL HIGH (ref 65–99)
Potassium: 4.6 mmol/L (ref 3.5–5.2)
Sodium: 139 mmol/L (ref 134–144)

## 2018-01-13 LAB — CBC
Hematocrit: 52.1 % — ABNORMAL HIGH (ref 37.5–51.0)
Hemoglobin: 17.3 g/dL (ref 13.0–17.7)
MCH: 29.5 pg (ref 26.6–33.0)
MCHC: 33.2 g/dL (ref 31.5–35.7)
MCV: 89 fL (ref 79–97)
Platelets: 420 10*3/uL (ref 150–450)
RBC: 5.87 x10E6/uL — ABNORMAL HIGH (ref 4.14–5.80)
RDW: 13.2 % (ref 12.3–15.4)
WBC: 11.1 10*3/uL — ABNORMAL HIGH (ref 3.4–10.8)

## 2018-01-13 LAB — LIPID PANEL
Chol/HDL Ratio: 3.7 ratio (ref 0.0–5.0)
Cholesterol, Total: 118 mg/dL (ref 100–199)
HDL: 32 mg/dL — ABNORMAL LOW (ref >39)
LDL Calculated: 43 mg/dL (ref 0–99)
Triglycerides: 213 mg/dL — ABNORMAL HIGH (ref 0–149)
VLDL Cholesterol Cal: 43 mg/dL — ABNORMAL HIGH (ref 5–40)

## 2018-01-18 ENCOUNTER — Telehealth: Payer: Self-pay | Admitting: Cardiology

## 2018-01-18 MED ORDER — ISOSORBIDE MONONITRATE ER 30 MG PO TB24: 30.0000 mg | ORAL_TABLET | Freq: Every day | ORAL | 6 refills | Status: DC

## 2018-01-18 MED ORDER — NITROGLYCERIN 0.4 MG SL SUBL: 0.4000 mg | SUBLINGUAL_TABLET | SUBLINGUAL | 3 refills | Status: DC | PRN

## 2018-01-18 NOTE — Telephone Encounter (Signed)
New message     *STAT* If patient is at the pharmacy, call can be transferred to refill team.   1. Which medications need to be refilled? (please list name of each medication and dose if known) isosorbide 30 mg nitroglycerin 0.4 mg   2. Which pharmacy/location (including street and city if local pharmacy) is medication to be sent to? [facility] in [place]   3. Do they need a 30 day or 90 day supply? 30 day   Pt states that his pharmacy was having difficulty receiving things on the day these were prescribed and they did not get them so they were not filled.

## 2018-01-18 NOTE — Telephone Encounter (Signed)
rx resent to pharmacy

## 2018-01-19 ENCOUNTER — Other Ambulatory Visit: Payer: Self-pay | Admitting: *Deleted

## 2018-01-19 DIAGNOSIS — Z01818 Encounter for other preprocedural examination: Secondary | ICD-10-CM

## 2018-01-19 DIAGNOSIS — I200 Unstable angina: Secondary | ICD-10-CM

## 2018-01-19 DIAGNOSIS — I25119 Atherosclerotic heart disease of native coronary artery with unspecified angina pectoris: Secondary | ICD-10-CM

## 2018-01-19 NOTE — Progress Notes (Signed)
ORDER PLACED FOR CARDIAC CATH 12/171/19

## 2018-01-23 ENCOUNTER — Telehealth: Payer: Self-pay | Admitting: *Deleted

## 2018-01-23 ENCOUNTER — Other Ambulatory Visit: Payer: Self-pay

## 2018-01-23 ENCOUNTER — Emergency Department: Payer: PPO

## 2018-01-23 ENCOUNTER — Emergency Department
Admission: EM | Admit: 2018-01-23 | Discharge: 2018-01-23 | Disposition: A | Discharge status: Home / Self Care | Payer: PPO | Attending: Emergency Medicine | Admitting: Emergency Medicine

## 2018-01-23 DIAGNOSIS — J069 Acute upper respiratory infection, unspecified: Secondary | ICD-10-CM

## 2018-01-23 DIAGNOSIS — J40 Bronchitis, not specified as acute or chronic: Secondary | ICD-10-CM | POA: Diagnosis not present

## 2018-01-23 DIAGNOSIS — I5042 Chronic combined systolic (congestive) and diastolic (congestive) heart failure: Secondary | ICD-10-CM | POA: Insufficient documentation

## 2018-01-23 DIAGNOSIS — I2510 Atherosclerotic heart disease of native coronary artery without angina pectoris: Secondary | ICD-10-CM | POA: Diagnosis not present

## 2018-01-23 DIAGNOSIS — I110 Hypertensive heart disease with heart failure: Secondary | ICD-10-CM | POA: Diagnosis not present

## 2018-01-23 DIAGNOSIS — J209 Acute bronchitis, unspecified: Secondary | ICD-10-CM | POA: Diagnosis not present

## 2018-01-23 DIAGNOSIS — Z7982 Long term (current) use of aspirin: Secondary | ICD-10-CM | POA: Insufficient documentation

## 2018-01-23 DIAGNOSIS — I252 Old myocardial infarction: Secondary | ICD-10-CM | POA: Insufficient documentation

## 2018-01-23 DIAGNOSIS — Z955 Presence of coronary angioplasty implant and graft: Secondary | ICD-10-CM | POA: Insufficient documentation

## 2018-01-23 DIAGNOSIS — Z87891 Personal history of nicotine dependence: Secondary | ICD-10-CM | POA: Insufficient documentation

## 2018-01-23 DIAGNOSIS — Z79899 Other long term (current) drug therapy: Secondary | ICD-10-CM | POA: Insufficient documentation

## 2018-01-23 DIAGNOSIS — R05 Cough: Secondary | ICD-10-CM | POA: Diagnosis not present

## 2018-01-23 DIAGNOSIS — J449 Chronic obstructive pulmonary disease, unspecified: Secondary | ICD-10-CM | POA: Diagnosis not present

## 2018-01-23 MED ORDER — AZITHROMYCIN 250 MG PO TABS: 250.0000 mg | ORAL_TABLET | Freq: Every day | ORAL | 0 refills | Status: DC

## 2018-01-23 MED ORDER — AZITHROMYCIN 500 MG PO TABS
500.0000 mg | ORAL_TABLET | Freq: Once | ORAL | Status: AC
  Administered 2018-01-23: 500 mg via ORAL
  Filled 2018-01-23: qty 1

## 2018-01-23 MED ORDER — GUAIFENESIN-CODEINE 100-10 MG/5ML PO SOLN: 5.0000 mL | Freq: Four times a day (QID) | ORAL | 0 refills | Status: DC | PRN

## 2018-01-23 MED ORDER — HYDROCOD POLST-CPM POLST ER 10-8 MG/5ML PO SUER
5.0000 mL | Freq: Once | ORAL | Status: AC
  Administered 2018-01-23: 5 mL via ORAL
  Filled 2018-01-23: qty 5

## 2018-01-23 NOTE — ED Notes (Signed)
Patient transported to X-ray 

## 2018-01-23 NOTE — Telephone Encounter (Signed)
Pt contacted pre-catheterization scheduled at [facility] for: Tuesday January 24, 2018 9 AM Verified arrival time and place: [place] Entrance A at: 7 AM  No solid food after midnight prior to cath, clear liquids until 5 AM day of procedure. Contrast allergy: no Verified no diabetes medications.  AM meds can be  taken pre-cath with sip of water including: ASA 81 mg Effient 10 mg  Confirmed patient has responsible person to drive home post procedure and for 24 hours after you arrive home: yes  Note: Patient states since yesterday he has had non productive cough, mild sore throat, head congestion, denies fever.  I offered to reschedule cath to 01/26/18, pt declined, states arrangements already made for tomorrow. Pt is aware that he will need to lie flat (he has had cath before), states cough suppressant controls symptoms and he generally feels good.  Pt advised to call our office if he feels worse during the day today or he develops fever. I have also given him phone number to [facility] to contact them in the morning if his symptoms are worse or he has fever.  Pt verbalized understanding.  I will forward to Dr Ellyn Hack for his review.

## 2018-01-23 NOTE — ED Triage Notes (Signed)
Pt arrives to ED via POV from home with c/o URI-like s/x's x3 days. Pt reports fever, non-productive cough, nasal congestion and headache. Pt reports fever at home of 100.5, Ibuprofen taken 5hrs PTA. Pt states he has a cardiac cath procedure scheduled for tomorrow AM in [place] and wants to make sure he's "ok for the surgery".

## 2018-01-23 NOTE — Discharge Instructions (Addendum)
As we discussed please take your medications as prescribed, call your cardiologist first thing in the morning to let them decide whether to proceed with your planned cardiac catheterization tomorrow.  Return to the emergency department for any shortness of breath, chest pain, or any other symptom personally concerning to yourself.

## 2018-01-23 NOTE — ED Provider Notes (Signed)
[facility] Emergency Department Provider Note  Time seen: 9:52 PM  I have reviewed the triage vital signs and the nursing notes.   HISTORY  Chief Complaint URI    HPI Troy Parrish is a 57 y.o. male with a past medical history of COPD, bronchitis, gastric reflux, hypertension, hyperlipidemia, presents to the emergency department for cough and low-grade fever.  According to the patient for the past 2 to 3 days he has been coughing, states he was exposed to his daughter who has an upper respiratory infection for the past 1 week.  States today had a low-grade temperature 100.5.  States he has been coughing quite a bit, states with his history of COPD he often times will get bronchitis or pneumonia.  Is supposed to have a cardiac catheterization scheduled for tomorrow morning and wanted to see if he is still able to go to that.  Denies any chest pain.  Denies any shortness of breath.  States his breathing feels good currently but he is coughing which is his main concern.   Past Medical History:  Diagnosis Date  . Bronchitis    Gets bronchitis almost every year  . CAD S/P percutaneous coronary angioplasty 06/2011; 6/ & 11/2013   S/P PCI to all 3 major vessels;a)  Ant STEMI 2001- BMS-LAD x2 -->(redo PCI 6/'15 -- pLAD Xience DES 2.5 x 12- 2.75 mm, mLAD 2.25 x 12 - 2.7 mm), b) '06 UA --> Cx- OM DES; c) 2013 Inf MI BMS mRCA --> d) 10/'15 PCI dRCA Promus P DES 4.0 x 16 (4.25 mm); PTCA of RPL2 (2.0 mm) &RPDA (2.25 mm) 11/2013; 4/18 PCI OM1 Synergy DES  2.5 x 16  . COPD (chronic obstructive pulmonary disease) ([place])   . Elevated WBC count   . Emphysema of lung ([place])   . Essential hypertension 07/07/2011  . Former heavy tobacco smoker     quit in May 2015 after multiple attempts at trying to quit before   . GERD (gastroesophageal reflux disease)   . Glucose intolerance (pre-diabetes) June 2015    hemoglobin A1c 6.6  . Headache    "Imdur related; stopped taking it; headaches  went away" (11/15/2013)  . Heart murmur   . History of colon polyps   . History of stomach ulcers   . Hyperlipidemia with target LDL less than 70 07/07/2011  . Metabolic syndrome    Pre-diabetes, hypertension and truncal obesity as well as dyslipidemia  . OSA on CPAP   . Pneumonia "several times"  . Recurrent upper respiratory infection (URI)   . Seizures ([place]) "several"   "last one was ~ 2011" (11/15/2013)  . Shortness of breath dyspnea   . ST elevation myocardial infarction (STEMI) of anterior wall ([place]) 2001   History of -- 2 stents in early and distal mid LAD; prior cardiologist was Dr. Remi Haggard in [place]  . ST elevation myocardial infarction (STEMI) of inferior wall ([place]) 07/07/2011   Occluded RCA 9.41D40 INTEGRITY; Echo 07/2013: EF 45-50%, Inferior & Posterolateral HK.   Marland Kitchen Umbilical hernia   . Unstable angina ([place]) 2006; 07/2013   Cx-OM - PCI 2.5 mm 13 mm Cypher DES [name], [place]) ; 07/2013: Severe ISR of both prox & Distal LAD stentS --> 2 DES stents (1 at prox edge of the proximal stent, 2nd covers the entire distal stent as well as proximal and distal edge stenose)   . Upper respiratory infection     Patient Active Problem List   Diagnosis Date  Noted  . Progressive angina ([place]) 01/11/2018  . Prediabetes 07/03/2017  . Reflux esophagitis   . Presence of drug coated stent in right coronary artery 11/15/2013  . Chronic combined systolic and diastolic congestive heart failure, NYHA class 2 ([place]) 11/07/2013  . Angina, class III ([place]) 11/07/2013  . OSA (obstructive sleep apnea) 12/17/2012  . COPD (chronic obstructive pulmonary disease) ([place]) 12/17/2012  . Chronotropic incompetence with left ventricular dysfunction --> improved with reduction of beta blocker 11/28/2012  . Cardiomyopathy, ischemic; EF~45% with hypokinesis of the inferior and inferolateral myocardium; CO 3.25 07/08/2011  . STEMI, acute inf. wall with 100% RCA stenosis, DES 07/07/11 (prior Anterior STEMI in  2001) 07/07/2011  . Essential hypertension 07/07/2011  . Hyperlipidemia with target LDL less than 70; by his report he is back on Crestor 07/07/2011  . Coronary artery disease involving native coronary artery of native heart with angina pectoris ([place]) 07/07/2011  . ST elevation myocardial infarction (STEMI) of inferior wall ([place]) 07/07/2011  . ST elevation myocardial infarction (STEMI) of anterior wall -- PCI to LAD 11/29/1999    Past Surgical History:  Procedure Laterality Date  . Cardiac MRI [facility]  09/2014   EF 51%. Mod HK of basal-mid Inf wall, mild HK of basal inferoseptum & basal-mid inferolateral wall with hyper enhancement (c/w subendocard RCA MI with significant viability), focal hyper enhancement of apical wall c/w dLAD subendocard MI & complete LAD viability. . No aortic stenosis. Normal RV function. No Ischemia on Adenosine Stress.  . COLONOSCOPY WITH PROPOFOL N/A 03/28/2015   Procedure: COLONOSCOPY WITH PROPOFOL;  Surgeon: Lucilla Lame, MD;  Location: [place];  Service: Endoscopy;  Laterality: N/A;  . CORONARY ANGIOPLASTY WITH STENT PLACEMENT  2001   ANTERIOR mi with a  PCI to LAD; [place], [place] - Dr. Dorris Fetch  . CORONARY ANGIOPLASTY WITH STENT PLACEMENT  2006   [place] by Dr Dorris Fetch -lesion lft circ/OM1 with 2.5 x 31m Cypher DES  . CORONARY STENT INTERVENTION N/A 05/19/2016   Procedure: Coronary Stent Intervention;  Surgeon: DLeonie Man MD;  [facility];  Service: Cardiovascular: PCI pOM1 - Synergy DES 2.5 x 16 (overlaps old stent)  . ESOPHAGOGASTRODUODENOSCOPY (EGD) WITH PROPOFOL N/A 03/28/2015   Procedure: ESOPHAGOGASTRODUODENOSCOPY (EGD) WITH PROPOFOL;  Surgeon: DLucilla Lame MD;  [facility]  Service: Endoscopy;  Laterality: N/A;  . INSERTION OF MESH N/A 04/02/2015   Procedure: INSERTION OF MESH;  Surgeon: RFlorene Glen MD;  Location: [facility];  Service: General;  Laterality: N/A;  . LEFT AND RIGHT HEART CATHETERIZATION WITH  CORONARY ANGIOGRAM N/A 07/30/2013   Procedure: LEFT AND RIGHT HEART CATHETERIZATION WITH CORONARY ANGIOGRAM;  Surgeon: DLeonie Man MD;  [facility];  Service: Cardiovascular;  2 separate P & mLAD lesions -> PCI, MOd-Severe dRCA disesase with diffuse RPL/PDA disease (FFR)  . LEFT HEART CATH  08/03/2013   Procedure: LEFT HEART CATH;  Surgeon: DLeonie Man MD;  [facility];  Service: Cardiovascular;;FFR of RCA - non-flow-limiting  . LEFT HEART CATH AND CORONARY ANGIOGRAPHY N/A 05/19/2016   Procedure: Left Heart Cath and Coronary Angiography;  Surgeon: DLeonie Man MD;  [facility];  Service: Cardiovascular: CULPRIT - 85% pOM1 pre-stent. Stents in both proximal and distal RCA widely patent. PTCA sites in RPL & PDA widely patent with stable 70% small RPL 2. Extensive stenting in the LAD widely patent stents with mild to mod Dz btw prox & mid-distal stents.   .Marland Kitchen  LEFT HEART CATHETERIZATION WITH CORONARY ANGIOGRAM N/A 07/07/2011   Procedure: LEFT HEART CATHETERIZATION WITH CORONARY ANGIOGRAM;  Surgeon: Leonie Man, MD;  Location: [facility] C[facility] LAB;  Service: Cardiovascular;  inferolat post LV infarct ST-elevation MI  . MET/CPET  11/09/2011   submax. effort 1.05 RER peak V02 was 51% ,chronotropic incomp.hrt rate lows 80s to 100  . MET/CPET  September 2016   [facility]: Mild functional impairment due primarily to mild pulmonary and circulatory limitations. Also suggest physical deconditioning (seen with low-normal aerobic reserve). Mild VQ mismatch with exercise.  Ventilatory reserve exhausted with peak exercise demonstrated pulmonary limitation. No significant decrease in postexercise FEV1 compared to rest --> suggest no exercise induced bronchospasm  . NM MYOVIEW ([place] HX)  11/30/2011   EF 45% ,exercise 10 METS, infarct\scar w mild perinfarct ischemia -basal inferolat and mid inferolat region  . NM MYOVIEW LTD  04/25/2016   Exercised for 9 minutes. Reached 90% max. Heart  rate. 9.4 METS. Read as a low risk study with normal EF 55-65%. -> on Review -  there does appear to be a small to moderate sized, medium severtiy reversible perfusion defect in the anterior wall - read by computer, but not noted by reader.  Marland Kitchen PERCUTANEOUS CORONARY STENT INTERVENTION (PCI-S)  07/07/2011   Procedure: PERCUTANEOUS CORONARY STENT INTERVENTION (PCI-S);  Surgeon: Leonie Man, MD;  Location: [facility] CATH LAB;  Service: Cardiovascular;;occluded RCA ,Integrity Resolute DES 2.75 x 18 mm stent - post-dilated 3.1 mm  . PERCUTANEOUS CORONARY STENT INTERVENTION (PCI-S)  07/30/2013   Procedure: PERCUTANEOUS CORONARY STENT INTERVENTION (PCI-S);  Surgeon: Leonie Man, MD;  Location: [facility] CATH LAB;  Service: Cardiovascular;;Distal mid LAD: Xience Alpine DES 2.25 mm x 28 mm (overlapping proximal and distal edge of previous stent) - 2.7 mm; proximal LAD 2.5 mm x 12 mm Xience Alpine DES (2.8 mm);; RCA 60-70% stenosis planned staged procedure  . PERCUTANEOUS CORONARY STENT INTERVENTION (PCI-S) N/A 11/15/2013   Procedure: PERCUTANEOUS CORONARY STENT INTERVENTION (PCI-S);  Surgeon: Leonie Man, MD;  [facility] LAB;  Service: Cardiovascular;  Patent Cx & LAD Stents; PCI -dRCA Promus Premier DES 4.0 mm x 16 mm (4.25 mm) dRCA, 2.0 mm Cutting PTCA of RPL2 [place] & POBA of mRPDA  . POLYPECTOMY  03/28/2015   Procedure: POLYPECTOMY;  Surgeon: Lucilla Lame, MD;  Location: [place];  Service: Endoscopy;;  . RIGHT HEART CATH  June 2015   Normal pressures with severely reduced cardiac output and index. (3.25/1.55)  . TRANSTHORACIC ECHOCARDIOGRAM  07/31/2013; February 2017   a. LVEF 45-50. Inferior posterior hypokinesis with mild dilation. Apparent normal diastolic pressures;   . UMBILICAL HERNIA REPAIR N/A 04/02/2015   Procedure: HERNIA REPAIR UMBILICAL ADULT;  Surgeon: Florene Glen, MD;  Location: [facility];  Service: General;  Laterality: N/A;    Prior to Admission medications   Medication Sig  Start Date End Date Taking? Authorizing Provider  ADVAIR DISKUS 100-50 MCG/DOSE AEPB INHALE ONE PUFF BY MOUTH TWICE A DAY Patient taking differently: Inhale 1 puff into the lungs 2 (two) times daily.  11/08/17   Jearld Fenton, NP  albuterol (PROAIR HFA) 108 (90 Base) MCG/ACT inhaler INHALE TWO PUFFS BY MOUTH EVERY 6 HOURS AS NEEDED FOR FOR SHORTNESS OF BREATH 07/06/17   Jearld Fenton, NP  aspirin 81 MG EC tablet Take 1 tablet (81 mg total) by mouth at bedtime. 11/14/15   Leonie Man, MD  clobetasol cream (TEMOVATE) 0.05 % APPLY TOPICALLY TO AFFECTED AREA(S) DAILY  AS NEEDED FOR ECZEMA Patient taking differently: Apply 1 application topically daily as needed (eczema).  01/10/18   Jearld Fenton, NP  EFFIENT 10 MG TABS tablet TAKE ONE TABLET BY MOUTH DAILY Patient taking differently: Take 10 mg by mouth every evening.  01/10/18   Leonie Man, MD  fluticasone [facility]) 50 MCG/ACT nasal spray Place 2 sprays into both nostrils daily. Patient taking differently: Place 2 sprays into both nostrils daily as needed (congestion).  05/15/17 05/15/18  Johnn Hai, PA-C  isosorbide mononitrate (IMDUR) 30 MG 24 hr tablet Take 1 tablet (30 mg total) by mouth daily. 01/18/18 04/18/18  Leonie Man, MD  Melatonin 1 MG CAPS Take 1 mg by mouth at bedtime as needed (sleep).    [provider]  metoprolol succinate (TOPROL XL) 25 MG 24 hr tablet Take 1.5 tablets (37.5 mg total) by mouth daily. Patient taking differently: Take 37.5 mg by mouth every evening.  01/11/18   Leonie Man, MD  nitroGLYCERIN (NITROSTAT) 0.4 MG SL tablet Place 1 tablet (0.4 mg total) under the tongue every 5 (five) minutes as needed for chest pain. 01/18/18   Leonie Man, MD  omeprazole (PRILOSEC) 20 MG capsule TAKE ONE CAPSULE BY MOUTH DAILY Patient taking differently: Take 20 mg by mouth every evening.  12/09/17   Leonie Man, MD  rosuvastatin (CRESTOR) 40 MG tablet Take 1 tablet (40 mg total) by mouth  daily. Patient taking differently: Take 40 mg by mouth every evening.  07/05/17   Lorretta Harp, MD    Allergies  Allergen Reactions  . Niacin And Related Hives and Itching    Family History  Problem Relation Age of Onset  . Coronary artery disease Father   . Heart disease Father   . Stroke Father   . Hypertension Father   . Hyperlipidemia Mother   . Diabetes Mother   . Hyperlipidemia Maternal Grandmother   . Diabetes Maternal Grandmother   . Hyperlipidemia Maternal Grandfather   . Diabetes Maternal Grandfather   . Emphysema Maternal Grandfather   . Heart disease Paternal Grandmother   . Heart disease Paternal Grandfather   . Cancer Maternal Aunt        breast    Social History Social History   Tobacco Use  . Smoking status: Former Smoker    Packs/day: 0.50    Years: 40.00    Pack years: 20.00    Types: Cigarettes    Last attempt to quit: 01/19/2017    Years since quitting: 1.0  . Smokeless tobacco: Never Used  . Tobacco comment: Quit with Bupropion 164m - > unfortunately, he restarted  Substance Use Topics  . Alcohol use: Yes    Comment: rare  . Drug use: No    Review of Systems Constitutional: Low-grade fever 100.5 ENT: Positive for congestion Cardiovascular: Negative for chest pain. Respiratory: Negative for shortness of breath.  Frequent cough.  No sputum production. Gastrointestinal: Negative for abdominal pain Genitourinary: Negative for urinary compaints Musculoskeletal: Negative for leg pain or swelling Skin: Negative for skin complaints  Neurological: Negative for headache All other ROS negative  ____________________________________________   PHYSICAL EXAM:  VITAL SIGNS: ED Triage Vitals  Enc Vitals Group     BP 01/23/18 2112 118/76     Pulse Rate 01/23/18 2112 (!) 108     Resp 01/23/18 2112 17     Temp 01/23/18 2112 98.2 F (36.8 C)     Temp Source 01/23/18 2112 Oral  SpO2 01/23/18 2112 96 %     Weight 01/23/18 2113 185 lb  (83.9 kg)     Height 01/23/18 2113 _0  (1.803 m)     Head Circumference --      Peak Flow --      Pain Score 01/23/18 2113 0     Pain Loc --      Pain Edu? --      Excl. in [place]? --    Constitutional: Alert and oriented. Well appearing and in no distress. Eyes: Normal exam ENT   Head: Normocephalic and atraumatic.   Mouth/Throat: Mucous membranes are moist. Cardiovascular: Normal rate, regular rhythm. No murmur Respiratory: Normal respiratory effort without tachypnea nor retractions. Breath sounds are clear, however with a frequent dry cough. Gastrointestinal: Soft and nontender. No distention.  Musculoskeletal: Nontender with normal range of motion in all extremities. No lower extremity tenderness  Neurologic:  Normal speech and language. No gross focal neurologic deficits  Skin:  Skin is warm, dry and intact.  Psychiatric: Mood and affect are normal  ____________________________________________    RADIOLOGY  Chest x-ray is clear  ____________________________________________   INITIAL IMPRESSION / ASSESSMENT AND PLAN / ED COURSE  Pertinent labs & imaging results that were available during my care of the patient were reviewed by me and considered in my medical decision making (see chart for details).  Patient presents to the emergency department for cough and congestion over the past 3 days.  Low-grade fever to 100.5 today.  Patient has a history of COPD and is worried about bronchitis versus pneumonia.  Differential would include COPD exacerbation, bronchitis, pneumonia, upper respiratory infection, viral infection.  Overall the patient appears well does have a frequent dry cough.  Given his reported low-grade fever this morning and history of significant COPD we will cover with Zithromax as a precaution.  No pneumonia is seen on x-ray, but symptoms do suggest acute bronchitis.  I discussed with the patient the need to follow-up with his cardiologist first thing in the  morning to let them decide if he is able to proceed with the planned cardiac catheterization.  Patient agreeable to plan of care.  We will discharge with Zithromax as well as a short course of cough medication for the patient.  ____________________________________________   FINAL CLINICAL IMPRESSION(S) / ED DIAGNOSES  Acute bronchitis Upper respiratory infection    Harvest Dark, MD 01/23/18 2156

## 2018-01-23 NOTE — ED Notes (Signed)
Pt ambulatory to POV without difficulty. VSS. NAD. Discharge instructions, RX and follow up reviewed. All questions and concerns addressed.  

## 2018-01-23 NOTE — Telephone Encounter (Signed)
Sounds OK - as long as no Fever.  Glenetta Hew, MD

## 2018-01-24 ENCOUNTER — Ambulatory Visit (HOSPITAL_COMMUNITY): Admission: RE | Admit: 2018-01-24 | Payer: PPO | Source: Home / Self Care | Admitting: Cardiology

## 2018-01-24 ENCOUNTER — Encounter (HOSPITAL_COMMUNITY): Admission: RE | Payer: Self-pay | Source: Home / Self Care

## 2018-01-24 SURGERY — LEFT HEART CATH AND CORONARY ANGIOGRAPHY
Anesthesia: LOCAL

## 2018-02-06 ENCOUNTER — Telehealth: Payer: Self-pay | Admitting: *Deleted

## 2018-02-06 DIAGNOSIS — I255 Ischemic cardiomyopathy: Secondary | ICD-10-CM

## 2018-02-06 DIAGNOSIS — I200 Unstable angina: Secondary | ICD-10-CM

## 2018-02-06 DIAGNOSIS — I25119 Atherosclerotic heart disease of native coronary artery with unspecified angina pectoris: Secondary | ICD-10-CM

## 2018-02-06 DIAGNOSIS — Z01818 Encounter for other preprocedural examination: Secondary | ICD-10-CM

## 2018-02-06 NOTE — Telephone Encounter (Signed)
[name]- NEED LABS WORK  [facility] FOR  UPCOMING  LEFT HEART CATH ON 02/13/17.  PATIENT AWARE WILL DO BEFORE Friday

## 2018-02-06 NOTE — Telephone Encounter (Signed)
Open error 

## 2018-02-07 ENCOUNTER — Other Ambulatory Visit: Payer: Self-pay | Admitting: *Deleted

## 2018-02-07 ENCOUNTER — Other Ambulatory Visit: Payer: Self-pay | Admitting: Internal Medicine

## 2018-02-07 DIAGNOSIS — Z01818 Encounter for other preprocedural examination: Secondary | ICD-10-CM

## 2018-02-07 DIAGNOSIS — I200 Unstable angina: Secondary | ICD-10-CM

## 2018-02-07 DIAGNOSIS — I255 Ischemic cardiomyopathy: Secondary | ICD-10-CM

## 2018-02-07 DIAGNOSIS — I5042 Chronic combined systolic (congestive) and diastolic (congestive) heart failure: Secondary | ICD-10-CM

## 2018-02-07 NOTE — Progress Notes (Signed)
ORDER PLACED FOR  LEFT HEART CATH - 02/13/18 PATIENT AWARE TO HAVE LABS DONE PRIOR TO 02/10/17 AND TAKE ASPIRIN 81 MG  THE MORNING OF PROCEDURE.

## 2018-02-09 DIAGNOSIS — I25119 Atherosclerotic heart disease of native coronary artery with unspecified angina pectoris: Secondary | ICD-10-CM | POA: Diagnosis not present

## 2018-02-09 DIAGNOSIS — I255 Ischemic cardiomyopathy: Secondary | ICD-10-CM | POA: Diagnosis not present

## 2018-02-09 DIAGNOSIS — I200 Unstable angina: Secondary | ICD-10-CM | POA: Diagnosis not present

## 2018-02-09 DIAGNOSIS — Z01818 Encounter for other preprocedural examination: Secondary | ICD-10-CM | POA: Diagnosis not present

## 2018-02-10 ENCOUNTER — Telehealth: Payer: Self-pay

## 2018-02-10 LAB — BASIC METABOLIC PANEL
BUN/Creatinine Ratio: 13 (ref 9–20)
BUN: 11 mg/dL (ref 6–24)
CO2: 18 mmol/L — ABNORMAL LOW (ref 20–29)
Calcium: 9 mg/dL (ref 8.7–10.2)
Chloride: 107 mmol/L — ABNORMAL HIGH (ref 96–106)
Creatinine, Ser: 0.84 mg/dL (ref 0.76–1.27)
GFR calc Af Amer: 112 mL/min/{1.73_m2} (ref >59)
GFR calc non Af Amer: 97 mL/min/{1.73_m2} (ref >59)
Glucose: 162 mg/dL — ABNORMAL HIGH (ref 65–99)
Potassium: 4.5 mmol/L (ref 3.5–5.2)
Sodium: 140 mmol/L (ref 134–144)

## 2018-02-10 LAB — CBC
Hematocrit: 48.5 % (ref 37.5–51.0)
Hemoglobin: 16.1 g/dL (ref 13.0–17.7)
MCH: 29.1 pg (ref 26.6–33.0)
MCHC: 33.2 g/dL (ref 31.5–35.7)
MCV: 88 fL (ref 79–97)
Platelets: 461 10*3/uL — ABNORMAL HIGH (ref 150–450)
RBC: 5.53 x10E6/uL (ref 4.14–5.80)
RDW: 13.3 % (ref 12.3–15.4)
WBC: 11.7 10*3/uL — ABNORMAL HIGH (ref 3.4–10.8)

## 2018-02-10 NOTE — Telephone Encounter (Signed)
Patient contacted pre-catheterization at [facility] scheduled for:  02/13/2018 at 7:30 am Verified arrival time and place:  Admitting at 5:30 am Confirmed AM meds to be taken pre-cath with sip of water: Take ASA/ Effient Confirmed patient has responsible person to drive home post procedure and observe patient for 24 hours:  yes Addl concerns:  none

## 2018-02-12 ENCOUNTER — Other Ambulatory Visit: Payer: Self-pay | Admitting: Cardiovascular Disease

## 2018-02-13 ENCOUNTER — Encounter (HOSPITAL_COMMUNITY): Payer: Self-pay | Admitting: Cardiology

## 2018-02-13 ENCOUNTER — Ambulatory Visit (HOSPITAL_COMMUNITY)
Admission: RE | Admit: 2018-02-13 | Discharge: 2018-02-13 | Disposition: A | Discharge status: Home / Self Care | Payer: PPO | Attending: Cardiology | Admitting: Cardiology

## 2018-02-13 ENCOUNTER — Encounter (HOSPITAL_COMMUNITY)
Admission: RE | Disposition: A | Discharge status: Home / Self Care | Payer: Self-pay | Source: Home / Self Care | Attending: Cardiology

## 2018-02-13 ENCOUNTER — Other Ambulatory Visit: Payer: Self-pay

## 2018-02-13 DIAGNOSIS — Z79899 Other long term (current) drug therapy: Secondary | ICD-10-CM | POA: Diagnosis not present

## 2018-02-13 DIAGNOSIS — Z7982 Long term (current) use of aspirin: Secondary | ICD-10-CM | POA: Diagnosis not present

## 2018-02-13 DIAGNOSIS — Z823 Family history of stroke: Secondary | ICD-10-CM | POA: Diagnosis not present

## 2018-02-13 DIAGNOSIS — I10 Essential (primary) hypertension: Secondary | ICD-10-CM | POA: Insufficient documentation

## 2018-02-13 DIAGNOSIS — E785 Hyperlipidemia, unspecified: Secondary | ICD-10-CM | POA: Diagnosis not present

## 2018-02-13 DIAGNOSIS — Z888 Allergy status to other drugs, medicaments and biological substances status: Secondary | ICD-10-CM | POA: Diagnosis not present

## 2018-02-13 DIAGNOSIS — Z833 Family history of diabetes mellitus: Secondary | ICD-10-CM | POA: Diagnosis not present

## 2018-02-13 DIAGNOSIS — G4733 Obstructive sleep apnea (adult) (pediatric): Secondary | ICD-10-CM | POA: Insufficient documentation

## 2018-02-13 DIAGNOSIS — Z7951 Long term (current) use of inhaled steroids: Secondary | ICD-10-CM | POA: Diagnosis not present

## 2018-02-13 DIAGNOSIS — R7303 Prediabetes: Secondary | ICD-10-CM | POA: Diagnosis not present

## 2018-02-13 DIAGNOSIS — Z8249 Family history of ischemic heart disease and other diseases of the circulatory system: Secondary | ICD-10-CM | POA: Insufficient documentation

## 2018-02-13 DIAGNOSIS — J449 Chronic obstructive pulmonary disease, unspecified: Secondary | ICD-10-CM | POA: Diagnosis not present

## 2018-02-13 DIAGNOSIS — I255 Ischemic cardiomyopathy: Secondary | ICD-10-CM

## 2018-02-13 DIAGNOSIS — I25119 Atherosclerotic heart disease of native coronary artery with unspecified angina pectoris: Secondary | ICD-10-CM | POA: Diagnosis not present

## 2018-02-13 DIAGNOSIS — I200 Unstable angina: Secondary | ICD-10-CM

## 2018-02-13 DIAGNOSIS — K219 Gastro-esophageal reflux disease without esophagitis: Secondary | ICD-10-CM | POA: Insufficient documentation

## 2018-02-13 DIAGNOSIS — Z955 Presence of coronary angioplasty implant and graft: Secondary | ICD-10-CM | POA: Insufficient documentation

## 2018-02-13 DIAGNOSIS — Z01818 Encounter for other preprocedural examination: Secondary | ICD-10-CM

## 2018-02-13 HISTORY — PX: LEFT HEART CATH AND CORONARY ANGIOGRAPHY: CATH118249

## 2018-02-13 SURGERY — LEFT HEART CATH AND CORONARY ANGIOGRAPHY
Anesthesia: LOCAL

## 2018-02-13 MED ORDER — VERAPAMIL HCL 2.5 MG/ML IV SOLN
INTRAVENOUS | Status: AC
  Filled 2018-02-13: qty 2

## 2018-02-13 MED ORDER — VERAPAMIL HCL 2.5 MG/ML IV SOLN
INTRAVENOUS | Status: DC | PRN
  Administered 2018-02-13: 10 mL via INTRA_ARTERIAL

## 2018-02-13 MED ORDER — SODIUM CHLORIDE 0.9% FLUSH: 3.0000 mL | INTRAVENOUS | Status: DC | PRN

## 2018-02-13 MED ORDER — HEPARIN (PORCINE) IN NACL 1000-0.9 UT/500ML-% IV SOLN
INTRAVENOUS | Status: AC
  Filled 2018-02-13: qty 500

## 2018-02-13 MED ORDER — HEPARIN (PORCINE) IN NACL 1000-0.9 UT/500ML-% IV SOLN
INTRAVENOUS | Status: DC | PRN
  Administered 2018-02-13 (×2): 500 mL

## 2018-02-13 MED ORDER — MIDAZOLAM HCL 2 MG/2ML IJ SOLN
INTRAMUSCULAR | Status: AC
  Filled 2018-02-13: qty 2

## 2018-02-13 MED ORDER — ACETAMINOPHEN 325 MG PO TABS: 650.0000 mg | ORAL_TABLET | ORAL | Status: DC | PRN

## 2018-02-13 MED ORDER — MIDAZOLAM HCL 2 MG/2ML IJ SOLN
INTRAMUSCULAR | Status: DC | PRN
  Administered 2018-02-13: 1 mg via INTRAVENOUS

## 2018-02-13 MED ORDER — LIDOCAINE HCL (PF) 1 % IJ SOLN
INTRAMUSCULAR | Status: DC | PRN
  Administered 2018-02-13: 2 mL via INTRADERMAL

## 2018-02-13 MED ORDER — SODIUM CHLORIDE 0.9 % IV SOLN
INTRAVENOUS | Status: DC
  Administered 2018-02-13: 06:00:00 via INTRAVENOUS

## 2018-02-13 MED ORDER — SODIUM CHLORIDE 0.9 % IV SOLN: INTRAVENOUS | Status: DC

## 2018-02-13 MED ORDER — SODIUM CHLORIDE 0.9 % IV SOLN: 250.0000 mL | INTRAVENOUS | Status: DC | PRN

## 2018-02-13 MED ORDER — SODIUM CHLORIDE 0.9% FLUSH: 3.0000 mL | Freq: Two times a day (BID) | INTRAVENOUS | Status: DC

## 2018-02-13 MED ORDER — ONDANSETRON HCL 4 MG/2ML IJ SOLN: 4.0000 mg | Freq: Four times a day (QID) | INTRAMUSCULAR | Status: DC | PRN

## 2018-02-13 MED ORDER — IOHEXOL 350 MG/ML SOLN
INTRAVENOUS | Status: DC | PRN
  Administered 2018-02-13: 70 mL via INTRACARDIAC

## 2018-02-13 MED ORDER — LIDOCAINE HCL (PF) 1 % IJ SOLN
INTRAMUSCULAR | Status: AC
  Filled 2018-02-13: qty 30

## 2018-02-13 MED ORDER — HEPARIN SODIUM (PORCINE) 1000 UNIT/ML IJ SOLN
INTRAMUSCULAR | Status: DC | PRN
  Administered 2018-02-13: 4000 [IU] via INTRAVENOUS

## 2018-02-13 SURGICAL SUPPLY — 11 items
CATH OPTITORQUE TIG 4.0 5F (CATHETERS) ×1 IMPLANT
DEVICE RAD COMP TR BAND LRG (VASCULAR PRODUCTS) ×1 IMPLANT
GLIDESHEATH SLEND A-KIT 6F 22G (SHEATH) ×1 IMPLANT
GUIDEWIRE INQWIRE 1.5J.035X260 (WIRE) IMPLANT
INQWIRE 1.5J .035X260CM (WIRE) ×2
KIT HEART LEFT (KITS) ×2 IMPLANT
PACK CARDIAC CATHETERIZATION (CUSTOM PROCEDURE TRAY) ×2 IMPLANT
SHEATH PROBE COVER 6X72 (BAG) ×1 IMPLANT
SYR MEDRAD MARK 7 150ML (SYRINGE) ×2 IMPLANT
TRANSDUCER W/STOPCOCK (MISCELLANEOUS) ×2 IMPLANT
TUBING CIL FLEX 10 FLL-RA (TUBING) ×2 IMPLANT

## 2018-02-13 NOTE — Progress Notes (Signed)
Dr. Ellyn Hack in to speak with pt. And family.

## 2018-02-13 NOTE — Discharge Instructions (Signed)
Radial Site Care ° °This sheet gives you information about how to care for yourself after your procedure. Your health care provider may also give you more specific instructions. If you have problems or questions, contact your health care provider. °What can I expect after the procedure? °After the procedure, it is common to have: °· Bruising and tenderness at the catheter insertion area. °Follow these instructions at home: °Medicines °· Take over-the-counter and prescription medicines only as told by your health care provider. °Insertion site care °· Follow instructions from your health care provider about how to take care of your insertion site. Make sure you: °? Wash your hands with soap and water before you change your bandage (dressing). If soap and water are not available, use hand sanitizer. °? Change your dressing as told by your health care provider. °? Leave stitches (sutures), skin glue, or adhesive strips in place. These skin closures may need to stay in place for 2 weeks or longer. If adhesive strip edges start to loosen and curl up, you may trim the loose edges. Do not remove adhesive strips completely unless your health care provider tells you to do that. °· Check your insertion site every day for signs of infection. Check for: °? Redness, swelling, or pain. °? Fluid or blood. °? Pus or a bad smell. °? Warmth. °· Do not take baths, swim, or use a hot tub until your health care provider approves. °· You may shower 24-48 hours after the procedure, or as directed by your health care provider. °? Remove the dressing and gently wash the site with plain soap and water. °? Pat the area dry with a clean towel. °? Do not rub the site. That could cause bleeding. °· Do not apply powder or lotion to the site. °Activity ° °· For 24 hours after the procedure, or as directed by your health care provider: °? Do not flex or bend the affected arm. °? Do not push or pull heavy objects with the affected arm. °? Do not  drive yourself home from the hospital or clinic. You may drive 24 hours after the procedure unless your health care provider tells you not to. °? Do not operate machinery or power tools. °· Do not lift anything that is heavier than 10 lb (4.5 kg), or the limit that you are told, until your health care provider says that it is safe. °· Ask your health care provider when it is okay to: °? Return to work or school. °? Resume usual physical activities or sports. °? Resume sexual activity. °General instructions °· If the catheter site starts to bleed, raise your arm and put firm pressure on the site. If the bleeding does not stop, get help right away. This is a medical emergency. °· If you went home on the same day as your procedure, a responsible adult should be with you for the first 24 hours after you arrive home. °· Keep all follow-up visits as told by your health care provider. This is important. °Contact a health care provider if: °· You have a fever. °· You have redness, swelling, or yellow drainage around your insertion site. °Get help right away if: °· You have unusual pain at the radial site. °· The catheter insertion area swells very fast. °· The insertion area is bleeding, and the bleeding does not stop when you hold steady pressure on the area. °· Your arm or hand becomes pale, cool, tingly, or numb. °These symptoms may represent a serious problem   that is an emergency. Do not wait to see if the symptoms will go away. Get medical help right away. Call your local emergency services (911 in the [place] Do not drive yourself to the hospital. °Summary °· After the procedure, it is common to have bruising and tenderness at the site. °· Follow instructions from your health care provider about how to take care of your radial site wound. Check the wound every day for signs of infection. °· Do not lift anything that is heavier than 10 lb (4.5 kg), or the limit that you are told, until your health care provider says  that it is safe. °This information is not intended to replace advice given to you by your health care provider. Make sure you discuss any questions you have with your health care provider. °Document Released: 02/27/2010 Document Revised: 03/02/2017 Document Reviewed: 03/02/2017 °Elsevier Interactive Patient Education © 2019 [employer] ° °

## 2018-02-13 NOTE — H&P (Signed)
Brief History and Physical Note:  NAME:  Troy Parrish.   MRN: [id number] DOB:  1960/05/18   ADMIT DATE: 02/13/2018   02/13/2018 7:08 AM  Troy Parrish. is a 58 y.o. male with past medical history notable for multivessel CAD (multiple STEMI's and PCI's with recurrent PCI's and held ischemic cardiomyopathy (ICM) who was recently seen for yearly follow-up on January 12, 2018 with complaints of now progressively worsening angina.   HPI: I saw him on the fifth he noted that for about the last 6 weeks or so leading up to his visit he been doing fine with no further anginal symptoms.  Was able to complete hikes on the [place].  However of late he was noticing worsening exertional dyspnea and chest pressure/tightness consistent with his prior angina. He was also started to develop signs symptoms of URI that got worse.  He was actually scheduled for cardiac catheterization prior to Christmas, however due to his fevers chills with likely bronchitis/COPD exacerbation, his procedure was canceled and postponed until today.  He has been treated with antibiotics and steroids and is feeling much better.  Basely his entire house got sick.  His coughing is improved.  He also notes to having started back on Imdur his chest pain and pressure with exertion has gotten somewhat better but still present.  Past Medical History:  Diagnosis Date  . Bronchitis    Gets bronchitis almost every year  . CAD S/P percutaneous coronary angioplasty 06/2011; 6/ & 11/2013   S/P PCI to all 3 major vessels;a)  Ant STEMI 2001- BMS-LAD x2 -->(redo PCI 6/'15 -- pLAD Xience DES 2.5 x 12- 2.75 mm, mLAD 2.25 x 12 - 2.7 mm), b) '06 UA --> Cx- OM DES; c) 2013 Inf MI BMS mRCA --> d) 10/'15 PCI dRCA Promus P DES 4.0 x 16 (4.25 mm); PTCA of RPL2 (2.0 mm) &RPDA (2.25 mm) 11/2013; 4/18 PCI OM1 Synergy DES  2.5 x 16  . COPD (chronic obstructive pulmonary disease) ([place])   . Elevated WBC count   . Emphysema of  lung ([place])   . Essential hypertension 07/07/2011  . Former heavy tobacco smoker     quit in May 2015 after multiple attempts at trying to quit before   . GERD (gastroesophageal reflux disease)   . Glucose intolerance (pre-diabetes) June 2015    hemoglobin A1c 6.6  . Headache    "Imdur related; stopped taking it; headaches went away" (11/15/2013)  . Heart murmur   . History of colon polyps   . History of stomach ulcers   . Hyperlipidemia with target LDL less than 70 07/07/2011  . Metabolic syndrome    Pre-diabetes, hypertension and truncal obesity as well as dyslipidemia  . OSA on CPAP   . Pneumonia "several times"  . Recurrent upper respiratory infection (URI)   . Seizures ([place]) "several"   "last one was ~ 2011" (11/15/2013)  . Shortness of breath dyspnea   . ST elevation myocardial infarction (STEMI) of anterior wall ([place]) 2001   History of -- 2 stents in early and distal mid LAD; prior cardiologist was Dr. Remi Haggard in [place]  . ST elevation myocardial infarction (STEMI) of inferior wall ([place]) 07/07/2011   Occluded RCA 4.09W11 INTEGRITY; Echo 07/2013: EF 45-50%, Inferior & Posterolateral HK.   Marland Kitchen Umbilical hernia   . Unstable angina ([place]) 2006; 07/2013   Cx-OM - PCI 2.5 mm 13 mm Cypher DES [facility] [place]) ; 07/2013: Severe ISR  of both prox & Distal LAD stentS --> 2 DES stents (1 at prox edge of the proximal stent, 2nd covers the entire distal stent as well as proximal and distal edge stenose)   . Upper respiratory infection    Past Surgical History:  Procedure Laterality Date  . Cardiac MRI [facility]  09/2014   EF 51%. Mod HK of basal-mid Inf wall, mild HK of basal inferoseptum & basal-mid inferolateral wall with hyper enhancement (c/w subendocard RCA MI with significant viability), focal hyper enhancement of apical wall c/w dLAD subendocard MI & complete LAD viability. . No aortic stenosis. Normal RV function. No Ischemia on Adenosine Stress.  . COLONOSCOPY WITH PROPOFOL N/A  03/28/2015   Procedure: COLONOSCOPY WITH PROPOFOL;  Surgeon: Lucilla Lame, MD;  Location: [place];  Service: Endoscopy;  Laterality: N/A;  . CORONARY ANGIOPLASTY WITH STENT PLACEMENT  2001   ANTERIOR mi with a  PCI to LAD; [place], [place] - Dr. Dorris Fetch  . CORONARY ANGIOPLASTY WITH STENT PLACEMENT  2006   [place] by Dr Dorris Fetch -lesion lft circ/OM1 with 2.5 x 86m Cypher DES  . CORONARY STENT INTERVENTION N/A 05/19/2016   Procedure: Coronary Stent Intervention;  Surgeon: DLeonie Man MD;  [facility];  Service: Cardiovascular: PCI pOM1 - Synergy DES 2.5 x 16 (overlaps old stent)  . ESOPHAGOGASTRODUODENOSCOPY (EGD) WITH PROPOFOL N/A 03/28/2015   Procedure: ESOPHAGOGASTRODUODENOSCOPY (EGD) WITH PROPOFOL;  Surgeon: DLucilla Lame MD;  [facility]  Service: Endoscopy;  Laterality: N/A;  . INSERTION OF MESH N/A 04/02/2015   Procedure: INSERTION OF MESH;  Surgeon: RFlorene Glen MD;  Location: [facility];  Service: General;  Laterality: N/A;  . LEFT AND RIGHT HEART CATHETERIZATION WITH CORONARY ANGIOGRAM N/A 07/30/2013   Procedure: LEFT AND RIGHT HEART CATHETERIZATION WITH CORONARY ANGIOGRAM;  Surgeon: DLeonie Man MD;  [facility];  Service: Cardiovascular;  2 separate P & mLAD lesions -> PCI, MOd-Severe dRCA disesase with diffuse RPL/PDA disease (FFR)  . LEFT HEART CATH  08/03/2013   Procedure: LEFT HEART CATH;  Surgeon: DLeonie Man MD;  [facility];  Service: Cardiovascular;;FFR of RCA - non-flow-limiting  . LEFT HEART CATH AND CORONARY ANGIOGRAPHY N/A 05/19/2016   Procedure: Left Heart Cath and Coronary Angiography;  Surgeon: DLeonie Man MD;  [facility];  Service: Cardiovascular: CULPRIT - 85% pOM1 pre-stent. Stents in both proximal and distal RCA widely patent. PTCA sites in RPL & PDA widely patent with stable 70% small RPL 2. Extensive stenting in the LAD widely patent stents with mild to mod Dz btw prox &  mid-distal stents.   .Marland KitchenLEFT HEART CATHETERIZATION WITH CORONARY ANGIOGRAM N/A 07/07/2011   Procedure: LEFT HEART CATHETERIZATION WITH CORONARY ANGIOGRAM;  Surgeon: DLeonie Man MD;  [facility];  Service: Cardiovascular;  inferolat post LV infarct ST-elevation MI  . MET/CPET  11/09/2011   submax. effort 1.05 RER peak V02 was 51% ,chronotropic incomp.hrt rate lows 80s to 100  . MET/CPET  September 2016   [facility]: Mild functional impairment due primarily to mild pulmonary and circulatory limitations. Also suggest physical deconditioning (seen with low-normal aerobic reserve). Mild VQ mismatch with exercise.  Ventilatory reserve exhausted with peak exercise demonstrated pulmonary limitation. No significant decrease in postexercise FEV1 compared to rest --> suggest no exercise induced bronchospasm  . NM MYOVIEW ([facility])  11/30/2011   EF 45% ,exercise 10 METS, infarct\scar w mild perinfarct ischemia -basal inferolat and  mid inferolat region  . NM MYOVIEW LTD  04/25/2016   Exercised for 9 minutes. Reached 90% max. Heart rate. 9.4 METS. Read as a low risk study with normal EF 55-65%. -> on Review -  there does appear to be a small to moderate sized, medium severtiy reversible perfusion defect in the anterior wall - read by computer, but not noted by reader.  Marland Kitchen PERCUTANEOUS CORONARY STENT INTERVENTION (PCI-S)  07/07/2011   Procedure: PERCUTANEOUS CORONARY STENT INTERVENTION (PCI-S);  Surgeon: Leonie Man, MD;  Location: [facility] LAB;  Service: Cardiovascular;;occluded RCA ,Integrity Resolute DES 2.75 x 18 mm stent - post-dilated 3.1 mm  . PERCUTANEOUS CORONARY STENT INTERVENTION (PCI-S)  07/30/2013   Procedure: PERCUTANEOUS CORONARY STENT INTERVENTION (PCI-S);  Surgeon: Leonie Man, MD;  [facility]: [facility] CATH LAB;  Service: Cardiovascular;;Distal mid LAD: Xience Alpine DES 2.25 mm x 28 mm (overlapping proximal and distal edge of previous stent) - 2.7 mm; proximal LAD 2.5 mm x 12 mm Xience Alpine DES  (2.8 mm);; RCA 60-70% stenosis planned staged procedure  . PERCUTANEOUS CORONARY STENT INTERVENTION (PCI-S) N/A 11/15/2013   Procedure: PERCUTANEOUS CORONARY STENT INTERVENTION (PCI-S);  Surgeon: Leonie Man, MD;  Location: [facility] CATH LAB;  Service: Cardiovascular;  Patent Cx & LAD Stents; PCI -dRCA Promus Premier DES 4.0 mm x 16 mm (4.25 mm) dRCA, 2.0 mm Cutting PTCA of RPL2 [place] & POBA of mRPDA  . POLYPECTOMY  03/28/2015   Procedure: POLYPECTOMY;  Surgeon: Lucilla Lame, MD;  Location: [place];  Service: Endoscopy;;  . RIGHT HEART CATH  June 2015   Normal pressures with severely reduced cardiac output and index. (3.25/1.55)  . TRANSTHORACIC ECHOCARDIOGRAM  07/31/2013; February 2017   a. LVEF 45-50. Inferior posterior hypokinesis with mild dilation. Apparent normal diastolic pressures;   . UMBILICAL HERNIA REPAIR N/A 04/02/2015   Procedure: HERNIA REPAIR UMBILICAL ADULT;  Surgeon: Florene Glen, MD;  Location: [facility];  Service: General;  Laterality: N/A;   Pre-Post Diagrams - 05/2016; OM1 PCI: SYNERGY DES 2.5X16 -overlaps OLD stent.               FAMHx: Family History  Problem Relation Age of Onset  . Coronary artery disease Father   . Heart disease Father   . Stroke Father   . Hypertension Father   . Hyperlipidemia Mother   . Diabetes Mother   . Hyperlipidemia Maternal Grandmother   . Diabetes Maternal Grandmother   . Hyperlipidemia Maternal Grandfather   . Diabetes Maternal Grandfather   . Emphysema Maternal Grandfather   . Heart disease Paternal Grandmother   . Heart disease Paternal Grandfather   . Cancer Maternal Aunt        breast    SOCHx:  reports that he quit smoking about 12 months ago. His smoking use included cigarettes. He has a 20.00 pack-year smoking history. He has never used smokeless tobacco. He reports current alcohol use. He reports that he does not use drugs.  ALLERGIES: Allergies  Allergen Reactions  . Niacin And Related Hives and Itching      HOME MEDICATIONS: Medications Prior to Admission  Medication Sig Dispense Refill Last Dose  . ADVAIR DISKUS 100-50 MCG/DOSE AEPB INHALE ONE PUFF BY MOUTH TWICE A DAY 60 each 1 02/13/2018 at Unknown time  . albuterol (PROAIR HFA) 108 (90 Base) MCG/ACT inhaler INHALE TWO PUFFS BY MOUTH EVERY 6 HOURS AS NEEDED FOR FOR SHORTNESS OF BREATH 8.5 each 11 02/13/2018 at Unknown time  . aspirin 81  MG EC tablet Take 1 tablet (81 mg total) by mouth at bedtime. 30 tablet  02/13/2018 at 0440  . clobetasol cream (TEMOVATE) 0.05 % APPLY TOPICALLY TO AFFECTED AREA(S) DAILY AS NEEDED FOR ECZEMA (Patient taking differently: Apply 1 application topically daily as needed (eczema). ) 30 g 4 Past Week at Unknown time  . EFFIENT 10 MG TABS tablet TAKE ONE TABLET BY MOUTH DAILY (Patient taking differently: Take 10 mg by mouth every evening. ) 30 tablet 4 02/13/2018 at 0440  . fluticasone (FLONASE) 50 MCG/ACT nasal spray Place 2 sprays into both nostrils daily. (Patient taking differently: Place 2 sprays into both nostrils daily as needed (congestion). ) 16 g 0 Past Week at Unknown time  . guaiFENesin-codeine 100-10 MG/5ML syrup Take 5 mLs by mouth every 6 (six) hours as needed for cough. 120 mL 0 Past Week at Unknown time  . isosorbide mononitrate (IMDUR) 30 MG 24 hr tablet Take 1 tablet (30 mg total) by mouth daily. 30 tablet 6 02/12/2018 at Unknown time  . Melatonin 1 MG CAPS Take 1 mg by mouth at bedtime as needed (sleep).   Past Month at Unknown time  . metoprolol succinate (TOPROL XL) 25 MG 24 hr tablet Take 1.5 tablets (37.5 mg total) by mouth daily. (Patient taking differently: Take 37.5 mg by mouth every evening. ) 45 tablet 6 02/12/2018 at Unknown time  . omeprazole (PRILOSEC) 20 MG capsule TAKE ONE CAPSULE BY MOUTH DAILY (Patient taking differently: Take 20 mg by mouth every evening. ) 30 capsule 4 02/12/2018 at Unknown time  . rosuvastatin (CRESTOR) 40 MG tablet Take 1 tablet (40 mg total) by mouth daily. (Patient taking  differently: Take 40 mg by mouth every evening. ) 90 tablet 3 02/12/2018 at Unknown time  . azithromycin (ZITHROMAX) 250 MG tablet Take 1 tablet (250 mg total) by mouth daily. 4 each 0 More than a month at Unknown time  . nitroGLYCERIN (NITROSTAT) 0.4 MG SL tablet Place 1 tablet (0.4 mg total) under the tongue every 5 (five) minutes as needed for chest pain. 25 tablet 3     Review of Systems  Constitutional: Positive for malaise/fatigue (Gradually feeling better after recent URI/COPD exacerbation). Negative for fever (None in the last week or so).  HENT: Negative for hearing loss and nosebleeds.   Respiratory: Positive for cough (Getting better), shortness of breath (Baseline) and wheezing (Getting better). Negative for sputum production.   Cardiovascular: Positive for chest pain (Per HPI.  Better since starting Imdur and recovering from URI). Negative for claudication.  Gastrointestinal: Negative for abdominal pain, blood in stool and heartburn.  Genitourinary: Negative for dysuria and hematuria.  Musculoskeletal: Negative for falls and joint pain.  Neurological: Negative.   Endo/Heme/Allergies: Positive for environmental allergies. Negative for polydipsia.  Psychiatric/Behavioral: Negative.   All other systems reviewed and are negative.    PHYSICAL EXAM:Blood pressure 104/71, pulse 84, temperature (!) 97.4 F (36.3 C), temperature source Oral, height _0  (1.803 m), weight 82.6 kg, SpO2 96 %. General appearance: alert, cooperative, appears stated age and no distress Neck: no adenopathy, no carotid bruit, no JVD, supple, symmetrical, trachea midline and thyroid not enlarged, symmetric, no tenderness/mass/nodules Lungs: clear to auscultation bilaterally and normal percussion bilaterally Heart: regular rate and rhythm, S1, S2 normal, no murmur, click, rub or gallop and normal apical impulse Abdomen: soft, non-tender; bowel sounds normal; no masses,  no organomegaly Extremities: extremities  normal, atraumatic, no cyanosis or edema Pulses: 2+ and symmetric Skin: Skin color, texture,  turgor normal. No rashes or lesions Neurologic: Alert and oriented X 3, normal strength and tone. Normal symmetric reflexes. Normal coordination and gait    Adult ECG Report  Rate: 78 ;  Rhythm: normal sinus rhythm; normal axis, intervals and durations.  Narrative Interpretation: Normal EKG  IMPRESSION & PLAN The patients' history has been reviewed, patient examined, no change in status from most recent note, stable for surgery. I have reviewed the patients' chart and labs. Questions were answered to the patient's satisfaction.    [name] has presented today for Cardiac Catheterization, with the diagnosis of Progressive Angina. The various methods of treatment have been discussed with the patient and family.   Risks / Complications include, but not limited to: Death, MI, CVA/TIA, VF/VT (with defibrillation), Bradycardia (need for temporary pacer placement), contrast induced nephropathy, bleeding / bruising / hematoma / pseudoaneurysm, vascular or coronary injury (with possible emergent CT or Vascular Surgery), adverse medication reactions, infection.     After consideration of risks, benefits and other options for treatment, the patient has consented to Procedure(s):  LEFT HEART CATHETERIZATION AND CORONARY ANGIOGRAPHY +/- AD Cameron   as a surgical intervention.   We will proceed with the planned procedure.   Kandace Blitz, M.D., M.S. Interventional Cardiologist   Pager # [phone] Phone # [phone] [address] [place], [place] [address]  02/13/2018 7:08 AM

## 2018-02-14 ENCOUNTER — Other Ambulatory Visit: Payer: Self-pay

## 2018-02-14 MED ORDER — EFFIENT 10 MG PO TABS: 10.0000 mg | ORAL_TABLET | Freq: Every evening | ORAL | 3 refills | Status: DC

## 2018-02-17 ENCOUNTER — Encounter: Payer: Self-pay | Admitting: Cardiology

## 2018-02-17 ENCOUNTER — Ambulatory Visit: Payer: PPO | Admitting: Cardiology

## 2018-02-17 VITALS — BP 124/82 | HR 100 | Ht 72.0 in | Wt 182.2 lb

## 2018-02-17 DIAGNOSIS — I25119 Atherosclerotic heart disease of native coronary artery with unspecified angina pectoris: Secondary | ICD-10-CM

## 2018-02-17 DIAGNOSIS — I209 Angina pectoris, unspecified: Secondary | ICD-10-CM | POA: Diagnosis not present

## 2018-02-17 DIAGNOSIS — E785 Hyperlipidemia, unspecified: Secondary | ICD-10-CM | POA: Diagnosis not present

## 2018-02-17 DIAGNOSIS — I5042 Chronic combined systolic (congestive) and diastolic (congestive) heart failure: Secondary | ICD-10-CM

## 2018-02-17 DIAGNOSIS — I10 Essential (primary) hypertension: Secondary | ICD-10-CM | POA: Diagnosis not present

## 2018-02-17 DIAGNOSIS — I255 Ischemic cardiomyopathy: Secondary | ICD-10-CM

## 2018-02-17 MED ORDER — DILTIAZEM HCL ER COATED BEADS 120 MG PO CP24: 120.0000 mg | ORAL_CAPSULE | ORAL | 6 refills | Status: DC

## 2018-02-17 MED ORDER — ISOSORBIDE MONONITRATE ER 60 MG PO TB24: 60.0000 mg | ORAL_TABLET | Freq: Every day | ORAL | 6 refills | Status: DC

## 2018-02-17 NOTE — Patient Instructions (Signed)
Medication Instructions:   --INCREASE ISOSORBIDE MONO TO 60 MG DAILY - TAKE 30 MINUTE AFTER  ASPIRIN , IF SYMPTOMS ARE PRESENT THEN TAKE 30 MINUTES AFTER TAKING EXTRA TYLENOL  --- START TAKING DILTIAZEM CD 120 MG TAKE IN THE MORNING If you need a refill on your cardiac medications before your next appointment, please call your pharmacy.   Lab work:  NOT NEEDED  If you have labs (blood work) drawn today and your tests are completely normal, you will receive your results only by: Marland Kitchen MyChart Message (if you have MyChart) OR . A paper copy in the mail If you have any lab test that is abnormal or we need to change your treatment, we will call you to review the results.  Testing/Procedures: NOT NEEDED  Follow-Up: At [facility], you and your health needs are our priority.  As part of our continuing mission to provide you with exceptional heart care, we have created designated Provider Care Teams.  These Care Teams include your primary Cardiologist (physician) and Advanced Practice Providers (APPs -  Physician Assistants and Nurse Practitioners) who all work together to provide you with the care you need, when you need it. . Your physician recommends that you schedule a follow-up appointment in 2 [place] .   Any Other Special Instructions Will Be Listed Below (If Applicable). INCREASE EXERCISING DAILY

## 2018-02-17 NOTE — Progress Notes (Signed)
PCP: Jearld Fenton, NP  Clinic Note: Chief Complaint  Patient presents with  . Hospitalization Follow-up    Post-cath follow-up  . Chest Pain    Adjusting medications  . Coronary Artery Disease    Cath results and images reviewed with patient   HPI: Troy Parrish. is a 58 y.o. male with a PMH notable for MV CAD (multiple STEMIs & PCIs) with ICM who presents today for 12 month.  He has a very significant multivessel coronary disease history with at least 3 STEMI's and at least 3 if not 4 episodes of progressive /unstable angina leading to additional PCI (now all 3 major arteries s/p PCI). He also has very difficult/poorly controlled lipids. He intermittently goes back to smoking, but is now in the process of trying to complete his quitting.  He was seen back on December 4 and noted symptoms of progression of angina similar to prior to her previous PCI.  Was scheduled for catheterization that was delayed until January 6.  Recent Hospitalizations:   For cardiac cath  Studies Personally Reviewed - (if available, images/films reviewed: From Epic Chart or Care Everywhere)  Cath February 13, 2017- similar findings when compared to prior cath. Dominance: Right       Interval History: [place] presents today for post-cath follow-up stating that he really has not done much since his cath.  But he still notes that his low.  He has not really done any exertion to note any chest comfort, but prior to his cath and prior to adjusting medications he had chest discomfort simply walking back into the exam room which she did not have today.  He notes that his heart rate still goes up a little bit, but not to the point that he had when he was having chest pain.  He is concerned that if his heart rate goes up further, it will get worse.  He is due to go on a trip with his family where he will do a lot of walking around.  He will give a good test to his new antianginal regimen. No heart failure symptoms  of PND, orthopnea or edema.  Although he feels his heart rate going up, he denies any palpitations or irregular heartbeats.  No syncope/near syncope or TIA/amaurosis fugax.  His cath was delayed due to a COPD exacerbation which seems to have cleared up.  It may be that some of his exertional dyspnea and chest discomfort was related to that episode in addition to angina.  Remains on Effient with no bleeding issues.  He is happy to announce that he has now been successful with smoking cessation for almost a year.  ROS: A comprehensive was performed. Review of Systems  Constitutional: Negative for malaise/fatigue (Energy is better).  HENT: Negative for congestion and nosebleeds.   Respiratory: Negative for cough, shortness of breath and wheezing.        Still has a little bit of cough, but for the most part his COPD exacerbation is improved to treatment with antibiotics and steroids.  Gastrointestinal: Negative for blood in stool, constipation and heartburn.  Genitourinary: Negative for dysuria and frequency.  Musculoskeletal: Negative for back pain, falls, joint pain and myalgias.  Skin: Negative.   Neurological: Negative for dizziness and headaches.  Endo/Heme/Allergies: Positive for environmental allergies. Does not bruise/bleed easily (But not overly worrisome).  Psychiatric/Behavioral: Negative for depression and memory loss. The patient is not nervous/anxious.        Better now, but  was extremely stressed around the time of his daughter's wedding.  Being around his family is very stressful.  All other systems reviewed and are negative.  I have reviewed and (if needed) personally updated the patient's problem list, medications, allergies, past medical and surgical history, social and family history.   Past Medical History:  Diagnosis Date  . Bronchitis    Gets bronchitis almost every year  . CAD S/P percutaneous coronary angioplasty 06/2011; 6/ & 11/2013   S/P PCI to all 3 major  vessels;a)  Ant STEMI 2001- BMS-LAD x2 -->(redo PCI 6/'15 -- pLAD Xience DES 2.5 x 12- 2.75 mm, mLAD 2.25 x 12 - 2.7 mm), b) '06 UA --> Cx- OM DES; c) 2013 Inf MI BMS mRCA --> d) 10/'15 PCI dRCA Promus P DES 4.0 x 16 (4.25 mm); PTCA of RPL2 (2.0 mm) &RPDA (2.25 mm) 11/2013; 4/18 PCI OM1 Synergy DES  2.5 x 16  . COPD (chronic obstructive pulmonary disease) ([place])   . Elevated WBC count   . Emphysema of lung ([place])   . Essential hypertension 07/07/2011  . Former heavy tobacco smoker     quit in May 2015 after multiple attempts at trying to quit before   . GERD (gastroesophageal reflux disease)   . Glucose intolerance (pre-diabetes) June 2015    hemoglobin A1c 6.6  . Headache    "Imdur related; stopped taking it; headaches went away" (11/15/2013)  . Heart murmur   . History of colon polyps   . History of stomach ulcers   . Hyperlipidemia with target LDL less than 70 07/07/2011  . Metabolic syndrome    Pre-diabetes, hypertension and truncal obesity as well as dyslipidemia  . OSA on CPAP   . Pneumonia "several times"  . Recurrent upper respiratory infection (URI)   . Seizures ([place]) "several"   "last one was ~ 2011" (11/15/2013)  . Shortness of breath dyspnea   . ST elevation myocardial infarction (STEMI) of anterior wall ([place]) 2001   History of -- 2 stents in early and distal mid LAD; prior cardiologist was Dr. Remi Haggard in [place]  . ST elevation myocardial infarction (STEMI) of inferior wall ([place]) 07/07/2011   Occluded RCA 8.34H96 INTEGRITY; Echo 07/2013: EF 45-50%, Inferior & Posterolateral HK.   Marland Kitchen Umbilical hernia   . Unstable angina ([place]) 2006; 07/2013   Cx-OM - PCI 2.5 mm 13 mm Cypher DES [name], [place]) ; 07/2013: Severe ISR of both prox & Distal LAD stentS --> 2 DES stents (1 at prox edge of the proximal stent, 2nd covers the entire distal stent as well as proximal and distal edge stenose)   . Upper respiratory infection     Past Surgical History:  Procedure Laterality Date  .  Cardiac MRI [facility]  09/2014   EF 51%. Mod HK of basal-mid Inf wall, mild HK of basal inferoseptum & basal-mid inferolateral wall with hyper enhancement (c/w subendocard RCA MI with significant viability), focal hyper enhancement of apical wall c/w dLAD subendocard MI & complete LAD viability. . No aortic stenosis. Normal RV function. No Ischemia on Adenosine Stress.  . COLONOSCOPY WITH PROPOFOL N/A 03/28/2015   Procedure: COLONOSCOPY WITH PROPOFOL;  Surgeon: Lucilla Lame, MD;  Location: [place];  Service: Endoscopy;  Laterality: N/A;  . CORONARY ANGIOPLASTY WITH STENT PLACEMENT  2001   ANTERIOR mi with a  PCI to LAD; [place], [place] - Dr. Dorris Fetch  . CORONARY ANGIOPLASTY WITH STENT PLACEMENT  2006   [place] by Dr Dorris Fetch -lesion  lft circ/OM1 with 2.5 x 43m Cypher DES  . CORONARY STENT INTERVENTION N/A 05/19/2016   Procedure: Coronary Stent Intervention;  Surgeon: DLeonie Man MD;  [facility];  Service: Cardiovascular: PCI pOM1 - Synergy DES 2.5 x 16 (overlaps old stent)  . ESOPHAGOGASTRODUODENOSCOPY (EGD) WITH PROPOFOL N/A 03/28/2015   Procedure: ESOPHAGOGASTRODUODENOSCOPY (EGD) WITH PROPOFOL;  Surgeon: DLucilla Lame MD;  [place] [facility]  Service: Endoscopy;  Laterality: N/A;  . INSERTION OF MESH N/A 04/02/2015   Procedure: INSERTION OF MESH;  Surgeon: RFlorene Glen MD;  Location: [facility];  Service: General;  Laterality: N/A;  . LEFT AND RIGHT HEART CATHETERIZATION WITH CORONARY ANGIOGRAM N/A 07/30/2013   Procedure: LEFT AND RIGHT HEART CATHETERIZATION WITH CORONARY ANGIOGRAM;  Surgeon: DLeonie Man MD;  [facility];  Service: Cardiovascular;  2 separate P & mLAD lesions -> PCI, MOd-Severe dRCA disesase with diffuse RPL/PDA disease (FFR)  . LEFT HEART CATH  08/03/2013   Procedure: LEFT HEART CATH;  Surgeon: DLeonie Man MD;  [facility];  Service: Cardiovascular;;FFR of RCA - non-flow-limiting  . LEFT HEART CATH AND CORONARY  ANGIOGRAPHY N/A 05/19/2016   Procedure: Left Heart Cath and Coronary Angiography;  Surgeon: DLeonie Man MD;  [place];  Service: Cardiovascular: CULPRIT - 85% pOM1 pre-stent. Stents in both proximal and distal RCA widely patent. PTCA sites in RPL & PDA widely patent with stable 70% small RPL 2. Extensive stenting in the LAD widely patent stents with mild to mod Dz btw prox & mid-distal stents.   .Marland KitchenLEFT HEART CATH AND CORONARY ANGIOGRAPHY N/A 02/13/2018   Procedure: LEFT HEART CATH AND CORONARY ANGIOGRAPHY;  Surgeon: HLeonie Man MD;  [facility];  Service: Cardiovascular: Similar findings to last cath post PCI.  Stents are patent.  Roughly 50% proximal LAD lesion noted, may be progression from prior cath but otherwise stable disease.  .Marland KitchenLEFT HEART CATHETERIZATION WITH CORONARY ANGIOGRAM N/A 07/07/2011   Procedure: LEFT HEART CATHETERIZATION WITH CORONARY ANGIOGRAM;  Surgeon: DLeonie Man MD;  [facility];  Service: Cardiovascular;  inferolat post LV infarct ST-elevation MI  . MET/CPET  11/09/2011   submax. effort 1.05 RER peak V02 was 51% ,chronotropic incomp.hrt rate lows 80s to 100  . MET/CPET  September 2016   [facility]: Mild functional impairment due primarily to mild pulmonary and circulatory limitations. Also suggest physical deconditioning (seen with low-normal aerobic reserve). Mild VQ mismatch with exercise.  Ventilatory reserve exhausted with peak exercise demonstrated pulmonary limitation. No significant decrease in postexercise FEV1 compared to rest --> suggest no exercise induced bronchospasm  . NM MYOVIEW ([facility])  11/30/2011   EF 45% ,exercise 10 METS, infarct\scar w mild perinfarct ischemia -basal inferolat and mid inferolat region  . NM MYOVIEW LTD  04/25/2016   Exercised for 9 minutes. Reached 90% max. Heart rate. 9.4 METS. Read as a low risk study with normal EF 55-65%. -> on Review -  there does appear to be a small to moderate  sized, medium severtiy reversible perfusion defect in the anterior wall - read by computer, but not noted by reader.  .Marland KitchenPERCUTANEOUS CORONARY STENT INTERVENTION (PCI-S)  07/07/2011   Procedure: PERCUTANEOUS CORONARY STENT INTERVENTION (PCI-S);  Surgeon: DLeonie Man MD;  [facility];  Service: Cardiovascular;;occluded RCA ,Integrity Resolute DES 2.75 x 18 mm stent - post-dilated 3.1 mm  . PERCUTANEOUS CORONARY STENT INTERVENTION (PCI-S)  07/30/2013  Procedure: PERCUTANEOUS CORONARY STENT INTERVENTION (PCI-S);  Surgeon: Leonie Man, MD;  [facility];  Service: Cardiovascular;;Distal mid LAD: Xience Alpine DES 2.25 mm x 28 mm (overlapping proximal and distal edge of previous stent) - 2.7 mm; proximal LAD 2.5 mm x 12 mm Xience Alpine DES (2.8 mm);; RCA 60-70% stenosis planned staged procedure  . PERCUTANEOUS CORONARY STENT INTERVENTION (PCI-S) N/A 11/15/2013   Procedure: PERCUTANEOUS CORONARY STENT INTERVENTION (PCI-S);  Surgeon: Leonie Man, MD;  Location: [facility];  Service: Cardiovascular;  Patent Cx & LAD Stents; PCI -dRCA Promus Premier DES 4.0 mm x 16 mm (4.25 mm) dRCA, 2.0 mm Cutting PTCA of RPL2 [place] & POBA of mRPDA  . POLYPECTOMY  03/28/2015   Procedure: POLYPECTOMY;  Surgeon: Lucilla Lame, MD;  Location: [facility];  Service: Endoscopy;;  . RIGHT HEART CATH  June 2015   Normal pressures with severely reduced cardiac output and index. (3.25/1.55)  . TRANSTHORACIC ECHOCARDIOGRAM  07/31/2013; February 2017   a. LVEF 45-50. Inferior posterior hypokinesis with mild dilation. Apparent normal diastolic pressures;   . UMBILICAL HERNIA REPAIR N/A 04/02/2015   Procedure: HERNIA REPAIR UMBILICAL ADULT;  Surgeon: Florene Glen, MD;  Location: [facility];  Service: General;  Laterality: N/A;   Pre-Post Diagrams - 05/2016; OM1 PCI: SYNERGY DES 2.5X16 -overlaps OLD stent.        Current Meds  Medication Sig  . ADVAIR DISKUS 100-50 MCG/DOSE AEPB INHALE ONE PUFF  BY MOUTH TWICE A DAY  . albuterol (PROAIR HFA) 108 (90 Base) MCG/ACT inhaler INHALE TWO PUFFS BY MOUTH EVERY 6 HOURS AS NEEDED FOR FOR SHORTNESS OF BREATH  . aspirin 81 MG EC tablet Take 1 tablet (81 mg total) by mouth at bedtime.  Marland Kitchen azithromycin (ZITHROMAX) 250 MG tablet Take 1 tablet (250 mg total) by mouth daily.  . clobetasol cream (TEMOVATE) 0.05 % APPLY TOPICALLY TO AFFECTED AREA(S) DAILY AS NEEDED FOR ECZEMA (Patient taking differently: Apply 1 application topically daily as needed (eczema). )  . EFFIENT 10 MG TABS tablet Take 1 tablet (10 mg total) by mouth every evening.  . fluticasone (FLONASE) 50 MCG/ACT nasal spray Place 2 sprays into both nostrils daily. (Patient taking differently: Place 2 sprays into both nostrils daily as needed (congestion). )  . guaiFENesin-codeine 100-10 MG/5ML syrup Take 5 mLs by mouth every 6 (six) hours as needed for cough.  . Melatonin 1 MG CAPS Take 1 mg by mouth at bedtime as needed (sleep).  . metoprolol succinate (TOPROL XL) 25 MG 24 hr tablet Take 1.5 tablets (37.5 mg total) by mouth daily. (Patient taking differently: Take 37.5 mg by mouth every evening. )  . nitroGLYCERIN (NITROSTAT) 0.4 MG SL tablet Place 1 tablet (0.4 mg total) under the tongue every 5 (five) minutes as needed for chest pain.  Marland Kitchen omeprazole (PRILOSEC) 20 MG capsule TAKE ONE CAPSULE BY MOUTH DAILY (Patient taking differently: Take 20 mg by mouth every evening. )  . rosuvastatin (CRESTOR) 40 MG tablet Take 1 tablet (40 mg total) by mouth daily. (Patient taking differently: Take 40 mg by mouth every evening. )  . [DISCONTINUED] isosorbide mononitrate (IMDUR) 30 MG 24 hr tablet Take 1 tablet (30 mg total) by mouth daily.  -- not taking Amlodipine.    Allergies  Allergen Reactions  . Niacin And Related Hives and Itching   Social History   Tobacco Use  . Smoking status: Former Smoker    Packs/day: 0.50    Years: 40.00  Pack years: 20.00    Types: Cigarettes    Last attempt to  quit: 01/19/2017    Years since quitting: 1.0  . Smokeless tobacco: Never Used  . Tobacco comment: Quit with Bupropion '150mg'$  - > unfortunately, he restarted  Substance Use Topics  . Alcohol use: Yes    Comment: rare  . Drug use: No   Social History   Social History Narrative   He is a married father of 2, grandfather of 22. He does not really get routine exercise.    He is down to 5 or 6 cigarettes a day. He is very seriously wanting to quit. This is a significant cutback for him, but he has pretty much been told he needs to stop by his wife, and so he fully intends to do so. He is willing to try the patches or whatever. He has a social alcoholic beverage every now and then.     family history includes Cancer in his maternal aunt; Coronary artery disease in his father; Diabetes in his maternal grandfather, maternal grandmother, and mother; Emphysema in his maternal grandfather; Heart disease in his father, paternal grandfather, and paternal grandmother; Hyperlipidemia in his maternal grandfather, maternal grandmother, and mother; Hypertension in his father; Stroke in his father.  Wt Readings from Last 3 Encounters:  02/17/18 182 lb 3.2 oz (82.6 kg)  02/13/18 182 lb (82.6 kg)  01/23/18 185 lb (83.9 kg)   PHYSICAL EXAM BP 124/82   Pulse 100   Ht 6' (1.829 m)   Wt 182 lb 3.2 oz (82.6 kg)   SpO2 94%   BMI 24.71 kg/m   Physical Exam  Constitutional: He is oriented to person, place, and time. He appears well-developed and well-nourished. No distress.  Healthy-appearing.  Well-groomed.  In good spirits today.  HENT:  Head: Normocephalic and atraumatic.  Mouth/Throat: No oropharyngeal exudate.  Eyes: EOM are normal. No scleral icterus.  Neck: Normal range of motion. Neck supple. No hepatojugular reflux and no JVD present. Carotid bruit is present (Cannot exclude soft bilateral bruits.  May be because of breathing).  Cardiovascular: Normal rate, regular rhythm, normal heart sounds and  intact distal pulses. Exam reveals no gallop and no friction rub.  No murmur heard. Nondisplaced PMI  Pulmonary/Chest: Effort normal and breath sounds normal. No respiratory distress. He has no wheezes. He has no rales.  Mild diffuse interstitial sounds.  But no rales or rhonchi  Abdominal: Soft. Bowel sounds are normal. He exhibits no distension. There is no abdominal tenderness.  Musculoskeletal: Normal range of motion.        General: No edema.  Neurological: He is alert and oriented to person, place, and time. Cranial nerve deficit: Grossly intact.  Psychiatric: He has a normal mood and affect. His behavior is normal. Thought content normal.  Nursing note and vitals reviewed.   Adult ECG Report none  Other studies Reviewed: Additional studies/ records that were reviewed today include:  Recent Labs:    Lab Results  Component Value Date   CHOL 118 01/12/2018   HDL 32 (L) 01/12/2018   LDLCALC 43 01/12/2018   LDLDIRECT 78.0 07/01/2017   TRIG 213 (H) 01/12/2018   CHOLHDL 3.7 01/12/2018    ASSESSMENT / PLAN: Problem List Items Addressed This Visit    Angina, class II ([place]) - Primary (Chronic)    Unfortunately, he still has angina.  I really did not find anything significant on cath besides the upstream LAD.  It did not appear to be  angiographically significant, however will need to pay attention to this, and if symptoms worsen, will probably proceed with FFR and possible PCI.  The last time he had a similar situation, we ended up fixing it.  For now I will increase his Imdur to 60 mg and add diltiazem 120 mg daily. Continue current dose of Toprol although would have a low threshold to increase it to 50 mg.      Relevant Medications   diltiazem (CARDIZEM CD) 120 MG 24 hr capsule   isosorbide mononitrate (IMDUR) 60 MG 24 hr tablet   Cardiomyopathy, ischemic; EF~45% with hypokinesis of the inferior and inferolateral myocardium; CO 3.25 (Chronic)    No heart failure symptoms.   Seems euvolemic on exam. LVEDP was normal on cath.  No diuretic requirement. Is on Toprol, but because of prior hypotension is no longer on ARB.  Will add low-dose diltiazem.      Relevant Medications   diltiazem (CARDIZEM CD) 120 MG 24 hr capsule   isosorbide mononitrate (IMDUR) 60 MG 24 hr tablet   Chronic combined systolic and diastolic congestive heart failure, NYHA class 2 (HCC) (Chronic)    Compensated.  No diuretic requirement.  Normal LVEDP by cath.      Relevant Medications   diltiazem (CARDIZEM CD) 120 MG 24 hr capsule   isosorbide mononitrate (IMDUR) 60 MG 24 hr tablet   Coronary artery disease involving native coronary artery of native heart with angina pectoris (HCC) (Chronic)    Currently remains on aspirin and Effient now.  Will refill the Effient.  Is on beta-blocker and statin.  Now adding diltiazem and increasing Imdur for additional antianginal benefit.  Low threshold to consider relook catheterization with FFR guided PCI of LAD.  Otherwise with Dr. consider microvascular disease is the only culprit.      Relevant Medications   diltiazem (CARDIZEM CD) 120 MG 24 hr capsule   isosorbide mononitrate (IMDUR) 60 MG 24 hr tablet   Essential hypertension (Chronic)    Stable blood pressure.  Should be able tolerate diltiazem.      Relevant Medications   diltiazem (CARDIZEM CD) 120 MG 24 hr capsule   isosorbide mononitrate (IMDUR) 60 MG 24 hr tablet   Hyperlipidemia with target LDL less than 70; by his report he is back on Crestor (Chronic)    Lipid levels look great on current dose of statin.  No change.      Relevant Medications   diltiazem (CARDIZEM CD) 120 MG 24 hr capsule   isosorbide mononitrate (IMDUR) 60 MG 24 hr tablet     Current medicines are reviewed at length with the patient today. (+/- concerns) -had a headache with Imdur  The following changes have been made: Take Imdur 30 minutes after aspirin if that does not work use Tylenol.  Increasing dose  to 60 mg and adding diltiazem 120 mg daily.    Patient Instructions  Medication Instructions:   --INCREASE ISOSORBIDE MONO TO 60 MG DAILY - TAKE 30 MINUTE AFTER  ASPIRIN , IF SYMPTOMS ARE PRESENT THEN TAKE 30 MINUTES AFTER TAKING EXTRA TYLENOL  --- START TAKING DILTIAZEM CD 120 MG TAKE IN THE MORNING If you need a refill on your cardiac medications before your next appointment, please call your pharmacy.   Lab work:  NOT NEEDED  If you have labs (blood work) drawn today and your tests are completely normal, you will receive your results only by: Marland Kitchen MyChart Message (if you have MyChart) OR . A paper copy  in the mail If you have any lab test that is abnormal or we need to change your treatment, we will call you to review the results.  Testing/Procedures: NOT NEEDED  Follow-Up: At [facility], you and your health needs are our priority.  As part of our continuing mission to provide you with exceptional heart care, we have created designated Provider Care Teams.  These Care Teams include your primary Cardiologist (physician) and Advanced Practice Providers (APPs -  Physician Assistants and Nurse Practitioners) who all work together to provide you with the care you need, when you need it. . Your physician recommends that you schedule a follow-up appointment in 2 [place] .   Any Other Special Instructions Will Be Listed Below (If Applicable). INCREASE EXERCISING DAILY    Glenetta Hew, M.D., M.S. Interventional Cardiologist   Pager # [phone] Phone # [phone] [address] [place], [place] [address]

## 2018-02-20 ENCOUNTER — Encounter: Payer: Self-pay | Admitting: Cardiology

## 2018-02-20 NOTE — Assessment & Plan Note (Signed)
Lipid levels look great on current dose of statin.  No change.

## 2018-02-20 NOTE — Assessment & Plan Note (Signed)
Currently remains on aspirin and Effient now.  Will refill the Effient.  Is on beta-blocker and statin.  Now adding diltiazem and increasing Imdur for additional antianginal benefit.  Low threshold to consider relook catheterization with FFR guided PCI of LAD.  Otherwise with Dr. consider microvascular disease is the only culprit.

## 2018-02-20 NOTE — Assessment & Plan Note (Signed)
Unfortunately, he still has angina.  I really did not find anything significant on cath besides the upstream LAD.  It did not appear to be angiographically significant, however will need to pay attention to this, and if symptoms worsen, will probably proceed with FFR and possible PCI.  The last time he had a similar situation, we ended up fixing it.  For now I will increase his Imdur to 60 mg and add diltiazem 120 mg daily. Continue current dose of Toprol although would have a low threshold to increase it to 50 mg.

## 2018-02-20 NOTE — Assessment & Plan Note (Signed)
No heart failure symptoms.  Seems euvolemic on exam. LVEDP was normal on cath.  No diuretic requirement. Is on Toprol, but because of prior hypotension is no longer on ARB.  Will add low-dose diltiazem.

## 2018-02-20 NOTE — Assessment & Plan Note (Signed)
Stable blood pressure.  Should be able tolerate diltiazem.

## 2018-02-20 NOTE — Assessment & Plan Note (Signed)
Compensated.  No diuretic requirement.  Normal LVEDP by cath.

## 2018-03-23 ENCOUNTER — Ambulatory Visit (INDEPENDENT_AMBULATORY_CARE_PROVIDER_SITE_OTHER): Payer: PPO | Admitting: Internal Medicine

## 2018-03-23 VITALS — BP 110/72 | HR 93 | Temp 97.9°F | Wt 184.0 lb

## 2018-03-23 DIAGNOSIS — J410 Simple chronic bronchitis: Secondary | ICD-10-CM

## 2018-03-23 DIAGNOSIS — J00 Acute nasopharyngitis [common cold]: Secondary | ICD-10-CM

## 2018-03-23 MED ORDER — GUAIFENESIN-CODEINE 100-10 MG/5ML PO SOLN: 5.0000 mL | Freq: Four times a day (QID) | ORAL | 0 refills | Status: DC | PRN

## 2018-03-23 MED ORDER — AMOXICILLIN 500 MG PO CAPS: 500.0000 mg | ORAL_CAPSULE | Freq: Three times a day (TID) | ORAL | 0 refills | Status: DC

## 2018-03-28 ENCOUNTER — Encounter: Payer: Self-pay | Admitting: Internal Medicine

## 2018-03-28 NOTE — Progress Notes (Signed)
HPI  Pt presents to the clinic today with c/o runny nose, nasal congestion, sore throat and cough. He reports this started 5 days ago. He is blowing clear/yellow mucous out of his nose. He denies difficulty swallowing. The cough is productive of clear mucous. He denies fever, chills or body aches but has felt fatigued. He has tried leftover cough syrup and some of his wife's Amoxicillin. He has had sick contacts. He does smoke, history of COPD, taking Advair and Albuterol as prescribed.  Review of Systems      Past Medical History:  Diagnosis Date  . Bronchitis    Gets bronchitis almost every year  . CAD S/P percutaneous coronary angioplasty 06/2011; 6/ & 11/2013   S/P PCI to all 3 major vessels;a)  Ant STEMI 2001- BMS-LAD x2 -->(redo PCI 6/'15 -- pLAD Xience DES 2.5 x 12- 2.75 mm, mLAD 2.25 x 12 - 2.7 mm), b) '06 UA --> Cx- OM DES; c) 2013 Inf MI BMS mRCA --> d) 10/'15 PCI dRCA Promus P DES 4.0 x 16 (4.25 mm); PTCA of RPL2 (2.0 mm) &RPDA (2.25 mm) 11/2013; 4/18 PCI OM1 Synergy DES  2.5 x 16  . COPD (chronic obstructive pulmonary disease) ([place])   . Elevated WBC count   . Emphysema of lung ([place])   . Essential hypertension 07/07/2011  . Former heavy tobacco smoker     quit in May 2015 after multiple attempts at trying to quit before   . GERD (gastroesophageal reflux disease)   . Glucose intolerance (pre-diabetes) June 2015    hemoglobin A1c 6.6  . Headache    "Imdur related; stopped taking it; headaches went away" (11/15/2013)  . Heart murmur   . History of colon polyps   . History of stomach ulcers   . Hyperlipidemia with target LDL less than 70 07/07/2011  . Metabolic syndrome    Pre-diabetes, hypertension and truncal obesity as well as dyslipidemia  . OSA on CPAP   . Pneumonia "several times"  . Recurrent upper respiratory infection (URI)   . Seizures ([place]) "several"   "last one was ~ 2011" (11/15/2013)  . Shortness of breath dyspnea   . ST elevation myocardial infarction (STEMI) of  anterior wall ([place]) 2001   History of -- 2 stents in early and distal mid LAD; prior cardiologist was Dr. Remi Haggard in [place]  . ST elevation myocardial infarction (STEMI) of inferior wall ([place]) 07/07/2011   Occluded RCA 2.70W23 INTEGRITY; Echo 07/2013: EF 45-50%, Inferior & Posterolateral HK.   Marland Kitchen Umbilical hernia   . Unstable angina ([place]) 2006; 07/2013   Cx-OM - PCI 2.5 mm 13 mm Cypher DES [name], [place]) ; 07/2013: Severe ISR of both prox & Distal LAD stentS --> 2 DES stents (1 at prox edge of the proximal stent, 2nd covers the entire distal stent as well as proximal and distal edge stenose)   . Upper respiratory infection     Family History  Problem Relation Age of Onset  . Coronary artery disease Father   . Heart disease Father   . Stroke Father   . Hypertension Father   . Hyperlipidemia Mother   . Diabetes Mother   . Hyperlipidemia Maternal Grandmother   . Diabetes Maternal Grandmother   . Hyperlipidemia Maternal Grandfather   . Diabetes Maternal Grandfather   . Emphysema Maternal Grandfather   . Heart disease Paternal Grandmother   . Heart disease Paternal Grandfather   . Cancer Maternal Aunt        breast  Social History   Socioeconomic History  . Marital status: Married    Spouse name: Not on file  . Number of children: Not on file  . Years of education: Not on file  . Highest education level: Not on file  Occupational History  . Not on file  Social Needs  . Financial resource strain: Not on file  . Food insecurity:    Worry: Not on file    Inability: Not on file  . Transportation needs:    Medical: Not on file    Non-medical: Not on file  Tobacco Use  . Smoking status: Former Smoker    Packs/day: 0.50    Years: 40.00    Pack years: 20.00    Types: Cigarettes    Last attempt to quit: 01/19/2017    Years since quitting: 1.1  . Smokeless tobacco: Never Used  . Tobacco comment: Quit with Bupropion 150mg  - > unfortunately, he restarted  Substance  and Sexual Activity  . Alcohol use: Yes    Comment: rare  . Drug use: No  . Sexual activity: Not on file  Lifestyle  . Physical activity:    Days per week: Not on file    Minutes per session: Not on file  . Stress: Not on file  Relationships  . Social connections:    Talks on phone: Not on file    Gets together: Not on file    Attends religious service: Not on file    Active member of club or organization: Not on file    Attends meetings of clubs or organizations: Not on file    Relationship status: Not on file  . Intimate partner violence:    Fear of current or ex partner: Not on file    Emotionally abused: Not on file    Physically abused: Not on file    Forced sexual activity: Not on file  Other Topics Concern  . Not on file  Social History Narrative   He is a married father of 10, grandfather of 38. He does not really get routine exercise.    He is down to 5 or 6 cigarettes a day. He is very seriously wanting to quit. This is a significant cutback for him, but he has pretty much been told he needs to stop by his wife, and so he fully intends to do so. He is willing to try the patches or whatever. He has a social alcoholic beverage every now and then.     Allergies  Allergen Reactions  . Niacin And Related Hives and Itching     Constitutional: Positive fatigue. Denies headache, fever or abrupt weight changes.  HEENT:  Positive runny nose, nasal congestion, sore throat. Denies eye redness, eye pain, pressure behind the eyes, facial pain, ear pain, ringing in the ears, wax buildup, or bloody nose. Respiratory: Positive cough. Denies difficulty breathing or shortness of breath.  Cardiovascular: Denies chest pain, chest tightness, palpitations or swelling in the hands or feet.   No other specific complaints in a complete review of systems (except as listed in HPI above).  Objective:   BP 110/72   Pulse 93   Temp 97.9 F (36.6 C) (Oral)   Wt 184 lb (83.5 kg)   SpO2 97%    BMI 24.95 kg/m  Wt Readings from Last 3 Encounters:  03/23/18 184 lb (83.5 kg)  02/17/18 182 lb 3.2 oz (82.6 kg)  02/13/18 182 lb (82.6 kg)     General: Appears his  stated age, in NAD. HEENT: Head: normal shape and size, no sinus tenderness noted; Ears: Tm's gray and intact, normal light reflex; Nose: mucosa pink and moist, septum midline; Throat/Mouth: + PND. Teeth present, mucosa erythematous and moist, no exudate noted, no lesions or ulcerations noted.  Neck: No cervical lymphadenopathy.  Cardiovascular: Normal rate and rhythm.  Pulmonary/Chest: Normal effort and positive vesicular breath sounds. No respiratory distress. No wheezes, rales or ronchi noted.       Assessment & Plan:   Upper Respiratory Infection:  Get some rest and drink plenty of water Do salt water gargles for the sore throat Continue Advair, Albuterol eRx for Amoxil 500 mg TID x 10 days eRx for Robitussin AC cough syrup  RTC as needed or if symptoms persist.   Webb Silversmith, NP

## 2018-03-28 NOTE — Patient Instructions (Signed)

## 2018-04-10 ENCOUNTER — Other Ambulatory Visit: Payer: Self-pay | Admitting: Internal Medicine

## 2018-04-10 DIAGNOSIS — I5042 Chronic combined systolic (congestive) and diastolic (congestive) heart failure: Secondary | ICD-10-CM

## 2018-04-16 ENCOUNTER — Other Ambulatory Visit: Payer: Self-pay | Admitting: Internal Medicine

## 2018-04-16 DIAGNOSIS — I5042 Chronic combined systolic (congestive) and diastolic (congestive) heart failure: Secondary | ICD-10-CM

## 2018-05-02 ENCOUNTER — Telehealth: Payer: Self-pay | Admitting: *Deleted

## 2018-05-02 NOTE — Telephone Encounter (Signed)
   Primary Cardiologist:  Glenetta Hew, MD   Patient contacted.  History reviewed.  No symptoms to suggest any unstable cardiac conditions.  Based on discussion, with current pandemic situation, we will be postponing this appointment for Troy Parrish. with a plan for f/u in 6-12 WEEKS wks or sooner if feasible/necessary.  If symptoms change, he has been instructed to contact our office.   Routing to C19 CANCEL pool for tracking Ines Bloomer  05/02/2018 4:07 PM         .

## 2018-05-04 ENCOUNTER — Ambulatory Visit: Payer: PPO | Admitting: Cardiology

## 2018-05-08 ENCOUNTER — Other Ambulatory Visit: Payer: Self-pay | Admitting: Internal Medicine

## 2018-05-08 ENCOUNTER — Other Ambulatory Visit: Payer: Self-pay | Admitting: Cardiology

## 2018-06-19 ENCOUNTER — Telehealth: Payer: Self-pay | Admitting: Cardiology

## 2018-06-19 NOTE — Telephone Encounter (Signed)
New Message   Patient is calling to request a letter showing that he is high risk with underlying conditions so that his wife can take to her job. Please call to discus.

## 2018-06-19 NOTE — Telephone Encounter (Signed)
The me try to work on that over the next couple days when I have some time.  Will notify when letter complete. Question would be is this for him or for his wife.  Glenetta Hew, MD

## 2018-06-26 ENCOUNTER — Other Ambulatory Visit: Payer: Self-pay | Admitting: Cardiovascular Disease

## 2018-06-27 NOTE — Telephone Encounter (Signed)
LEFT MESSAGE TO CALL BACK - NEED TO KNOW THE INFORMATION NEEDED FOR LETTER.

## 2018-07-04 ENCOUNTER — Ambulatory Visit (INDEPENDENT_AMBULATORY_CARE_PROVIDER_SITE_OTHER): Payer: PPO | Admitting: Internal Medicine

## 2018-07-04 ENCOUNTER — Encounter: Payer: Self-pay | Admitting: Internal Medicine

## 2018-07-04 VITALS — BP 126/85 | Wt 187.0 lb

## 2018-07-04 DIAGNOSIS — I5042 Chronic combined systolic (congestive) and diastolic (congestive) heart failure: Secondary | ICD-10-CM

## 2018-07-04 DIAGNOSIS — I10 Essential (primary) hypertension: Secondary | ICD-10-CM | POA: Diagnosis not present

## 2018-07-04 DIAGNOSIS — I25119 Atherosclerotic heart disease of native coronary artery with unspecified angina pectoris: Secondary | ICD-10-CM | POA: Diagnosis not present

## 2018-07-04 DIAGNOSIS — E785 Hyperlipidemia, unspecified: Secondary | ICD-10-CM | POA: Diagnosis not present

## 2018-07-04 DIAGNOSIS — Z0000 Encounter for general adult medical examination without abnormal findings: Secondary | ICD-10-CM | POA: Diagnosis not present

## 2018-07-04 DIAGNOSIS — R7303 Prediabetes: Secondary | ICD-10-CM | POA: Diagnosis not present

## 2018-07-04 DIAGNOSIS — J439 Emphysema, unspecified: Secondary | ICD-10-CM | POA: Diagnosis not present

## 2018-07-04 DIAGNOSIS — G4733 Obstructive sleep apnea (adult) (pediatric): Secondary | ICD-10-CM

## 2018-07-04 DIAGNOSIS — K2100 Gastro-esophageal reflux disease with esophagitis, without bleeding: Secondary | ICD-10-CM

## 2018-07-04 DIAGNOSIS — M25562 Pain in left knee: Secondary | ICD-10-CM

## 2018-07-04 NOTE — Progress Notes (Signed)
Virtual Visit via Video Note  I connected with Troy Parrish. on 07/04/18 at  3:15 PM EDT by a video enabled telemedicine application and verified that I am speaking with the correct person using two identifiers.  Location: Patient: Home Provider: Office   I discussed the limitations of evaluation and management by telemedicine and the availability of in person appointments. The patient expressed understanding and agreed to proceed.  History of Present Illness:  Pt due for his subsequent Medicare Wellness Exam. He is also due for follow up of chronic conditions.  CHF: Compensated. He is taking Metoprolol as prescribed. Echo from 03/2015 reviewed.  HTN: His BP at home is 126/85. He is taking Diltiazem, Metoprolol as prescribed. ECG from 02/2018 reviewed.  HLD w/CAD with Angina, s/p MI: He does have stents. His last LDL was 43, triglycerides 213, 02/2017. He is taking Rosuvastatin, Metoprolol, Diltiazem, Isosorbide, Effient and ASA as prescribed. He follows with cardiology.  Seizures: Remote history. None off meds.  OSA: He is currently not wearing his CPAP.  Sleep study from 04/2013 reviewed.  GERD: He denies breakthrough on Omeprazole. Upper GI from 03/2015 reviewed.  COPD: He has had some shortness of breath. He denies cough. Symptoms controlled on Advair. He uses Albuterol occasionally. PFT from 04/2013 reviewed.  Prediabetes: His last A1C was 6%, 02/2017. He does not monitor his sugar. He is not on any diabetic medication at this time.  HPI:  Past Medical History:  Diagnosis Date  . Bronchitis    Gets bronchitis almost every year  . CAD S/P percutaneous coronary angioplasty 06/2011; 6/ & 11/2013   S/P PCI to all 3 major vessels;a)  Ant STEMI 2001- BMS-LAD x2 -->(redo PCI 6/'15 -- pLAD Xience DES 2.5 x 12- 2.75 mm, mLAD 2.25 x 12 - 2.7 mm), b) '06 UA --> Cx- OM DES; c) 2013 Inf MI BMS mRCA --> d) 10/'15 PCI dRCA Promus P DES 4.0 x 16 (4.25 mm); PTCA of RPL2 (2.0 mm) &RPDA (2.25 mm)  11/2013; 4/18 PCI OM1 Synergy DES  2.5 x 16  . COPD (chronic obstructive pulmonary disease) ([place])   . Elevated WBC count   . Emphysema of lung ([place])   . Essential hypertension 07/07/2011  . Former heavy tobacco smoker     quit in May 2015 after multiple attempts at trying to quit before   . GERD (gastroesophageal reflux disease)   . Glucose intolerance (pre-diabetes) June 2015    hemoglobin A1c 6.6  . Headache    "Imdur related; stopped taking it; headaches went away" (11/15/2013)  . Heart murmur   . History of colon polyps   . History of stomach ulcers   . Hyperlipidemia with target LDL less than 70 07/07/2011  . Metabolic syndrome    Pre-diabetes, hypertension and truncal obesity as well as dyslipidemia  . OSA on CPAP   . Pneumonia "several times"  . Recurrent upper respiratory infection (URI)   . Seizures ([place]) "several"   "last one was ~ 2011" (11/15/2013)  . Shortness of breath dyspnea   . ST elevation myocardial infarction (STEMI) of anterior wall ([place]) 2001   History of -- 2 stents in early and distal mid LAD; prior cardiologist was Dr. Remi Haggard in [place]  . ST elevation myocardial infarction (STEMI) of inferior wall ([place]) 07/07/2011   Occluded RCA 1.93X90 INTEGRITY; Echo 07/2013: EF 45-50%, Inferior & Posterolateral HK.   Marland Kitchen Umbilical hernia   . Unstable angina ([place]) 2006; 07/2013   Cx-OM -  PCI 2.5 mm 13 mm Cypher DES [name], [place]) ; 07/2013: Severe ISR of both prox & Distal LAD stentS --> 2 DES stents (1 at prox edge of the proximal stent, 2nd covers the entire distal stent as well as proximal and distal edge stenose)   . Upper respiratory infection     Current Outpatient Medications  Medication Sig Dispense Refill  . albuterol (PROAIR HFA) 108 (90 Base) MCG/ACT inhaler INHALE TWO PUFFS BY MOUTH EVERY 6 HOURS AS NEEDED FOR SHORTNESS OF BREATH 8.5 g 10  . aspirin 81 MG EC tablet Take 1 tablet (81 mg total) by mouth at bedtime. 30 tablet   . clobetasol cream  (TEMOVATE) 0.05 % APPLY TOPICALLY TO AFFECTED AREA(S) DAILY AS NEEDED FOR ECZEMA (Patient taking differently: Apply 1 application topically daily as needed (eczema). ) 30 g 4  . diltiazem (CARDIZEM CD) 120 MG 24 hr capsule Take 1 capsule (120 mg total) by mouth every morning. 30 capsule 6  . EFFIENT 10 MG TABS tablet Take 1 tablet (10 mg total) by mouth every evening. 30 tablet 3  . Fluticasone-Salmeterol (ADVAIR) 100-50 MCG/DOSE AEPB INHALE ONE PUFF BY MOUTH TWICE A DAY 60 each 0  . Melatonin 1 MG CAPS Take 1 mg by mouth at bedtime as needed (sleep).    . metoprolol succinate (TOPROL XL) 25 MG 24 hr tablet Take 1.5 tablets (37.5 mg total) by mouth daily. (Patient taking differently: Take 37.5 mg by mouth every evening. ) 45 tablet 6  . nitroGLYCERIN (NITROSTAT) 0.4 MG SL tablet Place 1 tablet (0.4 mg total) under the tongue every 5 (five) minutes as needed for chest pain. 25 tablet 3  . omeprazole (PRILOSEC) 20 MG capsule TAKE ONE CAPSULE BY MOUTH DAILY 30 capsule 3  . rosuvastatin (CRESTOR) 40 MG tablet TAKE ONE TABLET BY MOUTH DAILY 90 tablet 0  . fluticasone (FLONASE) 50 MCG/ACT nasal spray Place 2 sprays into both nostrils daily. (Patient taking differently: Place 2 sprays into both nostrils daily as needed (congestion). ) 16 g 0  . isosorbide mononitrate (IMDUR) 60 MG 24 hr tablet Take 1 tablet (60 mg total) by mouth daily. TAKE 30 MINUTES AFTER DAILY ASPIRIN 30 tablet 6   No current facility-administered medications for this visit.     Allergies  Allergen Reactions  . Niacin And Related Hives and Itching    Family History  Problem Relation Age of Onset  . Coronary artery disease Father   . Heart disease Father   . Stroke Father   . Hypertension Father   . Hyperlipidemia Mother   . Diabetes Mother   . Hyperlipidemia Maternal Grandmother   . Diabetes Maternal Grandmother   . Hyperlipidemia Maternal Grandfather   . Diabetes Maternal Grandfather   . Emphysema Maternal Grandfather    . Heart disease Paternal Grandmother   . Heart disease Paternal Grandfather   . Cancer Maternal Aunt        breast    Social History   Socioeconomic History  . Marital status: Married    Spouse name: Not on file  . Number of children: Not on file  . Years of education: Not on file  . Highest education level: Not on file  Occupational History  . Not on file  Social Needs  . Financial resource strain: Not on file  . Food insecurity:    Worry: Not on file    Inability: Not on file  . Transportation needs:    Medical: Not on file  Non-medical: Not on file  Tobacco Use  . Smoking status: Former Smoker    Packs/day: 0.50    Years: 40.00    Pack years: 20.00    Types: Cigarettes    Last attempt to quit: 01/19/2017    Years since quitting: 1.4  . Smokeless tobacco: Never Used  . Tobacco comment: Quit with Bupropion 150mg  - > unfortunately, he restarted  Substance and Sexual Activity  . Alcohol use: Yes    Comment: rare  . Drug use: No  . Sexual activity: Not on file  Lifestyle  . Physical activity:    Days per week: Not on file    Minutes per session: Not on file  . Stress: Not on file  Relationships  . Social connections:    Talks on phone: Not on file    Gets together: Not on file    Attends religious service: Not on file    Active member of club or organization: Not on file    Attends meetings of clubs or organizations: Not on file    Relationship status: Not on file  . Intimate partner violence:    Fear of current or ex partner: Not on file    Emotionally abused: Not on file    Physically abused: Not on file    Forced sexual activity: Not on file  Other Topics Concern  . Not on file  Social History Narrative   He is a married father of 36, grandfather of 42. He does not really get routine exercise.    He is down to 5 or 6 cigarettes a day. He is very seriously wanting to quit. This is a significant cutback for him, but he has pretty much been told he needs  to stop by his wife, and so he fully intends to do so. He is willing to try the patches or whatever. He has a social alcoholic beverage every now and then.   PHQ 9 score of 0  Hospitiliaztions: None  Health Maintenance:    Flu: 11/2017  Tetanus: 02/2015  Pneumovax: 11/2015  PSA: 02/2017  Colon Screening: 03/2015  Eye Doctor: annually  Dental Exam: biannually   Providers:   PCP: Webb Silversmith, NP-C  Cardiologist: Dr. Ellyn Hack  Pulmonologist: Dr. Halford Chessman   I have personally reviewed and have noted:  1. The patient's medical and social history 2. Their use of alcohol, tobacco or illicit drugs 3. Their current medications and supplements 4. The patient's functional ability including ADL's, fall risks, home safety risks and hearing or visual impairment. 5. Diet and physical activities 6. Evidence for depression or mood disorder  Subjective:   Review of Systems:   Constitutional: Denies fever, malaise, fatigue, headache or abrupt weight changes.  HEENT: Denies eye pain, eye redness, ear pain, ringing in the ears, wax buildup, runny nose, nasal congestion, bloody nose, or sore throat. Respiratory: Pt reports intermittent shortness of breath. Denies difficulty breathing, cough or sputum production.   Cardiovascular: Denies chest pain, chest tightness, palpitations or swelling in the hands or feet.  Gastrointestinal: Pt reports intermittent LUQ pain. Denies bloating, constipation, diarrhea or blood in the stool.  GU: Denies urgency, frequency, pain with urination, burning sensation, blood in urine, odor or discharge. Musculoskeletal: Pt reports left knee pain. Denies decrease in range of motion, difficulty with gait, muscle pain or joint swelling.  Skin: Denies redness, rashes, lesions or ulcercations.  Neurological: Denies dizziness, difficulty with memory, difficulty with speech or problems with balance and coordination.  Psych:  Denies anxiety, depression, SI/HI.  No other specific  complaints in a complete review of systems (except as listed in HPI above).  Objective:  PE:   BP 126/85   Wt 187 lb (84.8 kg)   BMI 25.36 kg/m  Wt Readings from Last 3 Encounters:  07/04/18 187 lb (84.8 kg)  03/23/18 184 lb (83.5 kg)  02/17/18 182 lb 3.2 oz (82.6 kg)    General: Appears his stated age, well developed, well nourished in NAD. Skin: Warm, dry and intact.  HEENT: Head: normal shape and size; Eyes: sclera white, and EOMs intact;  Pulmonary/Chest: Normal effort. No respiratory distress.  Neurological: Alert and oriented.  Psychiatric: Mood and affect normal. Behavior is normal. Judgment and thought content normal.    BMET    Component Value Date/Time   NA 140 02/09/2018 1121   NA 139 10/24/2013 1050   K 4.5 02/09/2018 1121   K 3.8 10/24/2013 1050   CL 107 (H) 02/09/2018 1121   CL 107 10/24/2013 1050   CO2 18 (L) 02/09/2018 1121   CO2 24 10/24/2013 1050   GLUCOSE 162 (H) 02/09/2018 1121   GLUCOSE 94 07/01/2017 1518   GLUCOSE 108 (H) 10/24/2013 1050   BUN 11 02/09/2018 1121   BUN 8 10/24/2013 1050   CREATININE 0.84 02/09/2018 1121   CREATININE 1.01 05/13/2016 1044   CALCIUM 9.0 02/09/2018 1121   CALCIUM 8.2 (L) 10/24/2013 1050   GFRNONAA 97 02/09/2018 1121   GFRNONAA >60 10/24/2013 1050   GFRAA 112 02/09/2018 1121   GFRAA >60 10/24/2013 1050    Lipid Panel     Component Value Date/Time   CHOL 118 01/12/2018 0915   TRIG 213 (H) 01/12/2018 0915   HDL 32 (L) 01/12/2018 0915   CHOLHDL 3.7 01/12/2018 0915   CHOLHDL 4 07/01/2017 1518   VLDL 47.4 (H) 07/01/2017 1518   LDLCALC 43 01/12/2018 0915    CBC    Component Value Date/Time   WBC 11.7 (H) 02/09/2018 1121   WBC 15.6 (H) 07/01/2017 1518   RBC 5.53 02/09/2018 1121   RBC 5.70 07/01/2017 1518   HGB 16.1 02/09/2018 1121   HCT 48.5 02/09/2018 1121   PLT 461 (H) 02/09/2018 1121   MCV 88 02/09/2018 1121   MCV 89 10/24/2013 1050   MCH 29.1 02/09/2018 1121   MCH 30.4 05/13/2016 1044   MCHC  33.2 02/09/2018 1121   MCHC 34.2 07/01/2017 1518   RDW 13.3 02/09/2018 1121   RDW 14.5 10/24/2013 1050   LYMPHSABS 4.6 (H) 04/04/2015 1040   LYMPHSABS 2.8 11/07/2013 1509   MONOABS 0.9 04/04/2015 1040   EOSABS 0.1 04/04/2015 1040   EOSABS 0.2 11/07/2013 1509   BASOSABS 0.1 04/04/2015 1040   BASOSABS 0.1 11/07/2013 1509    Hgb A1C Lab Results  Component Value Date   HGBA1C 6.0 07/01/2017      Assessment and Plan:   Medicare Annual Wellness Visit:  Diet: He does eat meat. He consumes fruits and veggies daily. He does not eat fried foods. He drinks mostly water, unsweet tea. Physical activity: Sedentary Depression/mood screen: Negative Hearing: Intact to whispered voice Visual acuity: Grossly normal, performs annual eye exam  ADLs: Capable Fall risk: None Home safety: Good Cognitive evaluation: Intact to orientation, naming, recall and repetition EOL planning: Adv directives, full code/ I agree  Preventative Medicine: Flu, tetanus, pneumovax UTD. PSA screening done with labs. Colon screening UTD. Encouraged him to consume a balanced diet and exercise regimen. Advised him ot see  an eye doctor and dentist annually. Will check CBC, CMET, Lipid, A1C, PSA today.  Left Upper Quadrant Pain:  Will check amylase and lipase  Left Knee Pain:  Will obtain xray of left knee Tylenol 1000 mg TID prn Wear a brace to help support the area   Next appointment: 6 months, follow up chronic conditions.   Follow Up Instructions:    I discussed the assessment and treatment plan with the patient. The patient was provided an opportunity to ask questions and all were answered. The patient agreed with the plan and demonstrated an understanding of the instructions.   The patient was advised to call back or seek an in-person evaluation if the symptoms worsen or if the condition fails to improve as anticipated.    Webb Silversmith, NP

## 2018-07-05 ENCOUNTER — Other Ambulatory Visit (INDEPENDENT_AMBULATORY_CARE_PROVIDER_SITE_OTHER): Payer: PPO

## 2018-07-05 ENCOUNTER — Other Ambulatory Visit: Payer: Self-pay

## 2018-07-05 ENCOUNTER — Ambulatory Visit (INDEPENDENT_AMBULATORY_CARE_PROVIDER_SITE_OTHER)
Admission: RE | Admit: 2018-07-05 | Discharge: 2018-07-05 | Disposition: A | Discharge status: Home / Self Care | Payer: PPO | Source: Ambulatory Visit | Attending: Internal Medicine | Admitting: Internal Medicine

## 2018-07-05 DIAGNOSIS — K2100 Gastro-esophageal reflux disease with esophagitis, without bleeding: Secondary | ICD-10-CM

## 2018-07-05 DIAGNOSIS — J439 Emphysema, unspecified: Secondary | ICD-10-CM | POA: Diagnosis not present

## 2018-07-05 DIAGNOSIS — R7303 Prediabetes: Secondary | ICD-10-CM

## 2018-07-05 DIAGNOSIS — I25119 Atherosclerotic heart disease of native coronary artery with unspecified angina pectoris: Secondary | ICD-10-CM

## 2018-07-05 DIAGNOSIS — G4733 Obstructive sleep apnea (adult) (pediatric): Secondary | ICD-10-CM

## 2018-07-05 DIAGNOSIS — I10 Essential (primary) hypertension: Secondary | ICD-10-CM

## 2018-07-05 DIAGNOSIS — I5042 Chronic combined systolic (congestive) and diastolic (congestive) heart failure: Secondary | ICD-10-CM | POA: Diagnosis not present

## 2018-07-05 DIAGNOSIS — E785 Hyperlipidemia, unspecified: Secondary | ICD-10-CM | POA: Diagnosis not present

## 2018-07-05 DIAGNOSIS — M25562 Pain in left knee: Secondary | ICD-10-CM

## 2018-07-05 DIAGNOSIS — Z0000 Encounter for general adult medical examination without abnormal findings: Secondary | ICD-10-CM | POA: Diagnosis not present

## 2018-07-05 DIAGNOSIS — Z125 Encounter for screening for malignant neoplasm of prostate: Secondary | ICD-10-CM

## 2018-07-05 LAB — HEMOGLOBIN A1C: Hgb A1c MFr Bld: 6.7 % — ABNORMAL HIGH (ref 4.6–6.5)

## 2018-07-05 LAB — CBC
HCT: 48.9 % (ref 39.0–52.0)
Hemoglobin: 17 g/dL (ref 13.0–17.0)
MCHC: 34.7 g/dL (ref 30.0–36.0)
MCV: 89.1 fl (ref 78.0–100.0)
Platelets: 375 10*3/uL (ref 150.0–400.0)
RBC: 5.49 Mil/uL (ref 4.22–5.81)
RDW: 15 % (ref 11.5–15.5)
WBC: 14.1 10*3/uL — ABNORMAL HIGH (ref 4.0–10.5)

## 2018-07-05 LAB — LDL CHOLESTEROL, DIRECT: Direct LDL: 53 mg/dL

## 2018-07-05 LAB — COMPREHENSIVE METABOLIC PANEL
ALT: 35 U/L (ref 0–53)
AST: 26 U/L (ref 0–37)
Albumin: 4 g/dL (ref 3.5–5.2)
Alkaline Phosphatase: 152 U/L — ABNORMAL HIGH (ref 39–117)
BUN: 13 mg/dL (ref 6–23)
CO2: 26 mEq/L (ref 19–32)
Calcium: 9.1 mg/dL (ref 8.4–10.5)
Chloride: 106 mEq/L (ref 96–112)
Creatinine, Ser: 0.86 mg/dL (ref 0.40–1.50)
GFR: 91.29 mL/min (ref >60.00)
Glucose, Bld: 138 mg/dL — ABNORMAL HIGH (ref 70–99)
Potassium: 4.5 mEq/L (ref 3.5–5.1)
Sodium: 140 mEq/L (ref 135–145)
Total Bilirubin: 0.4 mg/dL (ref 0.2–1.2)
Total Protein: 6.8 g/dL (ref 6.0–8.3)

## 2018-07-05 LAB — LIPID PANEL
Cholesterol: 129 mg/dL (ref 0–200)
HDL: 30.9 mg/dL — ABNORMAL LOW (ref >39.00)
NonHDL: 98.55
Total CHOL/HDL Ratio: 4
Triglycerides: 346 mg/dL — ABNORMAL HIGH (ref 0.0–149.0)
VLDL: 69.2 mg/dL — ABNORMAL HIGH (ref 0.0–40.0)

## 2018-07-05 LAB — LIPASE: Lipase: 42 U/L (ref 11.0–59.0)

## 2018-07-05 LAB — AMYLASE: Amylase: 41 U/L (ref 27–131)

## 2018-07-05 LAB — PSA, MEDICARE: PSA: 0.33 ng/ml (ref 0.10–4.00)

## 2018-07-08 ENCOUNTER — Encounter: Payer: Self-pay | Admitting: Internal Medicine

## 2018-07-08 NOTE — Assessment & Plan Note (Signed)
Will have him set up lab only appt for CMET and Lipid panel Encouraged him to consume a low fat diet Continue Rosuvastatin

## 2018-07-08 NOTE — Assessment & Plan Note (Signed)
Continue Diltiazem and Metoprolol Reinforced DASH diet Will have him set up lab only appt for CMET

## 2018-07-08 NOTE — Assessment & Plan Note (Signed)
Encouraged him to use his CPAP as prescribed

## 2018-07-08 NOTE — Patient Instructions (Signed)

## 2018-07-08 NOTE — Assessment & Plan Note (Signed)
Continue Isosorbide, Metoprolol, Rosuvastatin, Diltiazem, Effient and ASA He will continue to follow with cardiology

## 2018-07-08 NOTE — Assessment & Plan Note (Signed)
Compensated Continue Metoprolol and Diltiazem Reinforced DASH diet Monitor weights daily

## 2018-07-08 NOTE — Assessment & Plan Note (Signed)
Continue Omeprazole Will have him set up lav only appt for CBC and CMET

## 2018-07-08 NOTE — Assessment & Plan Note (Signed)
Continue Advair and Albuterol Will monitor 

## 2018-07-08 NOTE — Assessment & Plan Note (Signed)
Will have him schedule lab only A1c

## 2018-08-08 ENCOUNTER — Telehealth: Payer: Self-pay | Admitting: Cardiology

## 2018-08-08 NOTE — Telephone Encounter (Signed)
Spoke with pt pain is in bottom of his ribs cage and when he turns. Sounds muscular, comes out of no where. PCP did blood work and still having pain. He will cal PCP and tell them the it is continuing

## 2018-08-08 NOTE — Telephone Encounter (Signed)
New Message             Patient is calling today complaining about execration pain and the first available is 08/17/18.Patient states it hurts from the bottom of his ribs cage and when he turns that's when it hurts the worst!!! Pls call to advise.

## 2018-08-09 ENCOUNTER — Telehealth: Payer: Self-pay | Admitting: *Deleted

## 2018-08-09 NOTE — Telephone Encounter (Signed)
Patient called stating that he did a virtual visit with Webb Silversmith NP in May. Patient stated that he is still having the pain on his left side in his ribcage area that he discussed with Rollene Fare at his visit.  Patient stated that he talked with the nurse at his cardiologist office and was advised that what he is describing does not seem to be related to his heart or COPD. Patient was advised to contact his PCP. Patient stated that the pain is not constant,  but it comes and goes and at time is severe. Advised patient that Rollene Fare is out of the office this week, but we can get him in to see someone else. Patient stated that he will just wait until Webb Silversmith NP is back in the office. Patient stated that he does have a history of a hernia and sometimes the pain seems to be in that area and hurts when he makes certain movements. Appointment scheduled with Webb Silversmith NP Monday. Advised patient if his pain gets worse before his appointment to call the office or go to the ER and he verbalized understanding.

## 2018-08-10 NOTE — Telephone Encounter (Signed)
Agree with advice given

## 2018-08-14 ENCOUNTER — Ambulatory Visit: Payer: PPO | Admitting: Internal Medicine

## 2018-08-16 ENCOUNTER — Telehealth: Payer: Self-pay | Admitting: Cardiology

## 2018-08-16 NOTE — Telephone Encounter (Signed)
LVM, reminding pt of his appt with Dr Ellyn Hack on 08-17-18.

## 2018-08-17 ENCOUNTER — Ambulatory Visit: Payer: PPO | Admitting: Cardiology

## 2018-08-17 ENCOUNTER — Encounter: Payer: Self-pay | Admitting: Cardiology

## 2018-08-17 ENCOUNTER — Other Ambulatory Visit: Payer: Self-pay

## 2018-08-17 VITALS — BP 110/74 | HR 88 | Temp 97.1°F | Ht 72.0 in | Wt 187.0 lb

## 2018-08-17 DIAGNOSIS — I208 Other forms of angina pectoris: Secondary | ICD-10-CM

## 2018-08-17 DIAGNOSIS — I5189 Other ill-defined heart diseases: Secondary | ICD-10-CM

## 2018-08-17 DIAGNOSIS — I25119 Atherosclerotic heart disease of native coronary artery with unspecified angina pectoris: Secondary | ICD-10-CM

## 2018-08-17 DIAGNOSIS — E785 Hyperlipidemia, unspecified: Secondary | ICD-10-CM | POA: Diagnosis not present

## 2018-08-17 DIAGNOSIS — I5042 Chronic combined systolic (congestive) and diastolic (congestive) heart failure: Secondary | ICD-10-CM

## 2018-08-17 DIAGNOSIS — E781 Pure hyperglyceridemia: Secondary | ICD-10-CM | POA: Diagnosis not present

## 2018-08-17 DIAGNOSIS — I10 Essential (primary) hypertension: Secondary | ICD-10-CM | POA: Diagnosis not present

## 2018-08-17 DIAGNOSIS — I255 Ischemic cardiomyopathy: Secondary | ICD-10-CM | POA: Diagnosis not present

## 2018-08-17 MED ORDER — FISH OIL 1000 MG PO CPDR: 3.0000 g | DELAYED_RELEASE_CAPSULE | Freq: Every day | ORAL | 11 refills | Status: DC

## 2018-08-17 MED ORDER — FENOFIBRATE 48 MG PO TABS: 48.0000 mg | ORAL_TABLET | Freq: Every day | ORAL | 6 refills | Status: DC

## 2018-08-17 NOTE — Progress Notes (Signed)
PCP: Jearld Fenton, NP  Clinic Note: Chief Complaint  Patient presents with  . Follow-up    doing much better  . Coronary Artery Disease    stable angina   HPI: Troy Parrish. is a 58 y.o. male with a PMH notable for MV CAD (multiple STEMIs & PCIs) with ICM who presents today for 12 month.   He has a very significant multivessel coronary disease history with at least 3 STEMI's and at least 3 if not 4 episodes of progressive /unstable angina leading to additional PCI (now all 3 major arteries s/p PCI). He also has very difficult/poorly controlled lipids. He intermittently goes back to smoking, but is now in the process of trying to complete his quitting.  Has had blood pressure issues with adding ARB or amlodipine, therefore we changed to diltiazem  Unable to afford Ranexa  Last visit: He was seen for post-cath follow-up on January 13 --> cardiac cath showed essentially stable disease with no obvious culprit lesion to be treated open mild progression of disease in proximal LAD, otherwise patent stents) --> he noted that he has an excellent result.  Is been going on a family trip to lots of walking.  He indicated that he was not taking amlodipine  Plan: Increase Imdur to 60 mg and added diltiazem 120 mg to his existing Toprol.  If symptoms not controlled, would then consider FFR of LAD.  Recent Hospitalizations:   None  Studies Personally Reviewed - (if available, images/films reviewed: From Epic Chart or Care Everywhere)  No new studies   Interval History: Tahsin presents today for 50-monthfollow-up stating that really with conversion from amlodipine to diltiazem made a significant difference for him.  He says within a week of starting the diltiazem, the fast heart rate spells and chest pain associated with it totally abated.  He was able to go on his trip with his family, walking around quite a bit up and down hills etc., without any chest pain.  Has not really had any  exertional dyspnea.    He denies any heart failure symptoms of PND, orthopnea or edema.  No recent COPD exacerbation. He is working on smoking cessation, just not fully able to quit.  Every time he thinks he is quitting he goes back to work.  Remainder of Cardiovascular ROS: no chest pain or dyspnea on exertion positive for - He will have some shortness of breath if he really overdoes it, and if he continues would have some chest pain, but really does not do that very often. negative for - edema, irregular heartbeat, orthopnea, palpitations, paroxysmal nocturnal dyspnea, rapid heart rate, shortness of breath or Syncope/near syncope, TIA/amaurosis fugax, claudication  No bleeding or breathing issues on Effient.   He is happy to announce that he has now been successful with smoking cessation for almost a year.  ROS: A comprehensive was performed. Review of Systems  Constitutional: Negative for malaise/fatigue (Energy is better).  HENT: Negative for congestion and nosebleeds.   Respiratory: Negative for cough (Mild morning cough), shortness of breath and wheezing.   Gastrointestinal: Negative for blood in stool, constipation and heartburn.  Genitourinary: Negative for dysuria and frequency.  Musculoskeletal: Negative for back pain, falls, joint pain and myalgias.  Skin: Negative.   Neurological: Negative for dizziness and headaches.  Endo/Heme/Allergies: Positive for environmental allergies. Does not bruise/bleed easily (But not overly worrisome).  Psychiatric/Behavioral: Negative for depression and memory loss. The patient is not nervous/anxious.  No more stress; just still a little bit bummed about not feeling well enough to go back to work.  All other systems reviewed and are negative.  The patient does not have symptoms concerning for COVID-19 infection (fever, chills, cough, or new shortness of breath).  The patient is practicing social distancing.  His wife actually is now  working from home whenever possible as well.  He likes to go outside and do things, but whenever he is around people wears a mask.   COVID-19 Education: The signs and symptoms of COVID-19 were discussed with the patient and how to seek care for testing (follow up with PCP or arrange E-visit).   The importance of social distancing was discussed today.   I have reviewed and (if needed) personally updated the patient's problem list, medications, allergies, past medical and surgical history, social and family history.   Past Medical History:  Diagnosis Date  . Bronchitis    Gets bronchitis almost every year  . CAD S/P percutaneous coronary angioplasty 06/2011; 6/ & 11/2013   S/P PCI to all 3 major vessels;a)  Ant STEMI 2001- BMS-LAD x2 -->(redo PCI 6/'15 -- pLAD Xience DES 2.5 x 12- 2.75 mm, mLAD 2.25 x 12 - 2.7 mm), b) '06 UA --> Cx- OM DES; c) 2013 Inf MI BMS mRCA --> d) 10/'15 PCI dRCA Promus P DES 4.0 x 16 (4.25 mm); PTCA of RPL2 (2.0 mm) &RPDA (2.25 mm) 11/2013; 4/18 PCI OM1 Synergy DES  2.5 x 16  . COPD (chronic obstructive pulmonary disease) ([place])   . Elevated WBC count   . Emphysema of lung ([place])   . Essential hypertension 07/07/2011  . Former heavy tobacco smoker     quit in May 2015 after multiple attempts at trying to quit before   . GERD (gastroesophageal reflux disease)   . Glucose intolerance (pre-diabetes) June 2015    hemoglobin A1c 6.6  . Headache    "Imdur related; stopped taking it; headaches went away" (11/15/2013)  . Heart murmur   . History of colon polyps   . History of stomach ulcers   . Hyperlipidemia with target LDL less than 70 07/07/2011  . Metabolic syndrome    Pre-diabetes, hypertension and truncal obesity as well as dyslipidemia  . OSA on CPAP   . Pneumonia "several times"  . Recurrent upper respiratory infection (URI)   . Seizures ([place]) "several"   "last one was ~ 2011" (11/15/2013)  . Shortness of breath dyspnea   . ST elevation myocardial infarction  (STEMI) of anterior wall ([place]) 2001   History of -- 2 stents in early and distal mid LAD; prior cardiologist was Dr. Remi Haggard in [place]  . ST elevation myocardial infarction (STEMI) of inferior wall ([place]) 07/07/2011   Occluded RCA 1.28N86 INTEGRITY; Echo 07/2013: EF 45-50%, Inferior & Posterolateral HK.   Marland Kitchen Umbilical hernia   . Unstable angina ([place]) 2006; 07/2013   Cx-OM - PCI 2.5 mm 13 mm Cypher DES [name], [place]) ; 07/2013: Severe ISR of both prox & Distal LAD stentS --> 2 DES stents (1 at prox edge of the proximal stent, 2nd covers the entire distal stent as well as proximal and distal edge stenose)   . Upper respiratory infection     Past Surgical History:  Procedure Laterality Date  . Cardiac MRI [facility]  09/2014   EF 51%. Mod HK of basal-mid Inf wall, mild HK of basal inferoseptum & basal-mid inferolateral wall with hyper enhancement (c/w subendocard RCA MI with  significant viability), focal hyper enhancement of apical wall c/w dLAD subendocard MI & complete LAD viability. . No aortic stenosis. Normal RV function. No Ischemia on Adenosine Stress.  . COLONOSCOPY WITH PROPOFOL N/A 03/28/2015   Procedure: COLONOSCOPY WITH PROPOFOL;  Surgeon: Lucilla Lame, MD;  Location: [place];  Service: Endoscopy;  Laterality: N/A;  . CORONARY ANGIOPLASTY WITH STENT PLACEMENT  2001   ANTERIOR mi with a  PCI to LAD; [place], [place] - Dr. Dorris Fetch  . CORONARY ANGIOPLASTY WITH STENT PLACEMENT  2006   [place] by Dr Dorris Fetch -lesion lft circ/OM1 with 2.5 x 57m Cypher DES  . CORONARY STENT INTERVENTION N/A 05/19/2016   Procedure: Coronary Stent Intervention;  Surgeon: DLeonie Man MD;  [facility];  Service: Cardiovascular: PCI pOM1 - Synergy DES 2.5 x 16 (overlaps old stent)  . ESOPHAGOGASTRODUODENOSCOPY (EGD) WITH PROPOFOL N/A 03/28/2015   Procedure: ESOPHAGOGASTRODUODENOSCOPY (EGD) WITH PROPOFOL;  Surgeon: DLucilla Lame MD;  [place] [facility]  Service: Endoscopy;   Laterality: N/A;  . INSERTION OF MESH N/A 04/02/2015   Procedure: INSERTION OF MESH;  Surgeon: RFlorene Glen MD;  Location: [facility];  Service: General;  Laterality: N/A;  . LEFT AND RIGHT HEART CATHETERIZATION WITH CORONARY ANGIOGRAM N/A 07/30/2013   Procedure: LEFT AND RIGHT HEART CATHETERIZATION WITH CORONARY ANGIOGRAM;  Surgeon: DLeonie Man MD;  [facility];  Service: Cardiovascular;  2 separate P & mLAD lesions -> PCI, MOd-Severe dRCA disesase with diffuse RPL/PDA disease (FFR)  . LEFT HEART CATH  08/03/2013   Procedure: LEFT HEART CATH;  Surgeon: DLeonie Man MD;  [facility];  Service: Cardiovascular;;FFR of RCA - non-flow-limiting  . LEFT HEART CATH AND CORONARY ANGIOGRAPHY N/A 05/19/2016   Procedure: Left Heart Cath and Coronary Angiography;  Surgeon: DLeonie Man MD;  [place];  Service: Cardiovascular: CULPRIT - 85% pOM1 pre-stent. Stents in both proximal and distal RCA widely patent. PTCA sites in RPL & PDA widely patent with stable 70% small RPL 2. Extensive stenting in the LAD widely patent stents with mild to mod Dz btw prox & mid-distal stents.   .Marland KitchenLEFT HEART CATH AND CORONARY ANGIOGRAPHY N/A 02/13/2018   Procedure: LEFT HEART CATH AND CORONARY ANGIOGRAPHY;  Surgeon: HLeonie Man MD;  [facility];  Service: Cardiovascular: Similar findings to last cath post PCI.  Stents are patent.  Roughly 50% proximal LAD lesion noted, may be progression from prior cath but otherwise stable disease.  .Marland KitchenLEFT HEART CATHETERIZATION WITH CORONARY ANGIOGRAM N/A 07/07/2011   Procedure: LEFT HEART CATHETERIZATION WITH CORONARY ANGIOGRAM;  Surgeon: DLeonie Man MD;  [facility];  Service: Cardiovascular;  inferolat post LV infarct ST-elevation MI  . MET/CPET  11/09/2011   submax. effort 1.05 RER peak V02 was 51% ,chronotropic incomp.hrt rate lows 80s to 100  . MET/CPET  September 2016   [facility]: Mild functional impairment due  primarily to mild pulmonary and circulatory limitations. Also suggest physical deconditioning (seen with low-normal aerobic reserve). Mild VQ mismatch with exercise.  Ventilatory reserve exhausted with peak exercise demonstrated pulmonary limitation. No significant decrease in postexercise FEV1 compared to rest --> suggest no exercise induced bronchospasm  . NM MYOVIEW (AVianHX)  11/30/2011   EF 45% ,exercise 10 METS, infarct\scar w mild perinfarct ischemia -basal inferolat and mid inferolat region  . NM MYOVIEW LTD  04/25/2016   Exercised for 9 minutes. Reached 90% max. Heart rate.  9.4 METS. Read as a low risk study with normal EF 55-65%. -> on Review -  there does appear to be a small to moderate sized, medium severtiy reversible perfusion defect in the anterior wall - read by computer, but not noted by reader.  Marland Kitchen PERCUTANEOUS CORONARY STENT INTERVENTION (PCI-S)  07/07/2011   Procedure: PERCUTANEOUS CORONARY STENT INTERVENTION (PCI-S);  Surgeon: Leonie Man, MD;  [facility];  Service: Cardiovascular;;occluded RCA ,Integrity Resolute DES 2.75 x 18 mm stent - post-dilated 3.1 mm  . PERCUTANEOUS CORONARY STENT INTERVENTION (PCI-S)  07/30/2013   Procedure: PERCUTANEOUS CORONARY STENT INTERVENTION (PCI-S);  Surgeon: Leonie Man, MD;  Location: [facility];  Service: Cardiovascular;;Distal mid LAD: Xience Alpine DES 2.25 mm x 28 mm (overlapping proximal and distal edge of previous stent) - 2.7 mm; proximal LAD 2.5 mm x 12 mm Xience Alpine DES (2.8 mm);; RCA 60-70% stenosis planned staged procedure  . PERCUTANEOUS CORONARY STENT INTERVENTION (PCI-S) N/A 11/15/2013   Procedure: PERCUTANEOUS CORONARY STENT INTERVENTION (PCI-S);  Surgeon: Leonie Man, MD;  Location: [facility] C[facility] LAB;  Service: Cardiovascular;  Patent Cx & LAD Stents; PCI -dRCA Promus Premier DES 4.0 mm x 16 mm (4.25 mm) dRCA, 2.0 mm Cutting PTCA of RPL2 [place] & POBA of mRPDA  . POLYPECTOMY  03/28/2015   Procedure: POLYPECTOMY;   Surgeon: Lucilla Lame, MD;  Location: [place];  Service: Endoscopy;;  . RIGHT HEART CATH  June 2015   Normal pressures with severely reduced cardiac output and index. (3.25/1.55)  . TRANSTHORACIC ECHOCARDIOGRAM  07/31/2013; February 2017   a. LVEF 45-50. Inferior posterior hypokinesis with mild dilation. Apparent normal diastolic pressures;   . UMBILICAL HERNIA REPAIR N/A 04/02/2015   Procedure: HERNIA REPAIR UMBILICAL ADULT;  Surgeon: Florene Glen, MD;  Location: [facility];  Service: General;  Laterality: N/A;    05/2016; OM1 PCI: SYNERGY DES 2.5X16 -overlaps OLD stent.  Cath February 13, 2018 similar findings when compared to prior cath.      Current Meds  Medication Sig  . albuterol (PROAIR HFA) 108 (90 Base) MCG/ACT inhaler INHALE TWO PUFFS BY MOUTH EVERY 6 HOURS AS NEEDED FOR SHORTNESS OF BREATH  . clobetasol cream (TEMOVATE) 0.05 % APPLY TOPICALLY TO AFFECTED AREA(S) DAILY AS NEEDED FOR ECZEMA (Patient taking differently: Apply 1 application topically daily as needed (eczema). )  . diltiazem (CARDIZEM CD) 120 MG 24 hr capsule Take 1 capsule (120 mg total) by mouth every morning.  Marland Kitchen EFFIENT 10 MG TABS tablet Take 1 tablet (10 mg total) by mouth every evening.  . fluticasone (FLONASE) 50 MCG/ACT nasal spray Place 2 sprays into both nostrils daily. (Patient taking differently: Place 2 sprays into both nostrils daily as needed (congestion). )  . Fluticasone-Salmeterol (ADVAIR) 100-50 MCG/DOSE AEPB INHALE ONE PUFF BY MOUTH TWICE A DAY  . isosorbide mononitrate (IMDUR) 60 MG 24 hr tablet Take 1 tablet (60 mg total) by mouth daily. TAKE 30 MINUTES AFTER DAILY ASPIRIN  . Melatonin 1 MG CAPS Take 1 mg by mouth at bedtime as needed (sleep).  . metoprolol succinate (TOPROL XL) 25 MG 24 hr tablet Take 1.5 tablets (37.5 mg total) by mouth daily. (Patient taking differently: Take 37.5 mg by mouth every evening. )  . nitroGLYCERIN (NITROSTAT) 0.4 MG SL tablet Place 1 tablet (0.4 mg total)  under the tongue every 5 (five) minutes as needed for chest pain.  Marland Kitchen omeprazole (PRILOSEC) 20 MG capsule TAKE ONE CAPSULE BY MOUTH DAILY  . rosuvastatin (  CRESTOR) 40 MG tablet TAKE ONE TABLET BY MOUTH DAILY     Allergies  Allergen Reactions  . Niacin And Related Hives and Itching   Social History   Tobacco Use  . Smoking status: Current Every Day Smoker    Packs/day: 0.50    Years: 40.00    Pack years: 20.00    Types: Cigarettes    Last attempt to quit: 01/19/2017    Years since quitting: 1.5  . Smokeless tobacco: Never Used  . Tobacco comment: Quit with Bupropion '150mg'$  - > unfortunately, he restarted  Substance Use Topics  . Alcohol use: Yes    Comment: rare  . Drug use: No   Social History   Social History Narrative   He is a married father of 53, grandfather of 68. He does not really get routine exercise.    He is down to 5 or 6 cigarettes a day. He is very seriously wanting to quit. This is a significant cutback for him, but he has pretty much been told he needs to stop by his wife, and so he fully intends to do so. He is willing to try the patches or whatever. He has a social alcoholic beverage every now and then.     family history includes Cancer in his maternal aunt; Coronary artery disease in his father; Diabetes in his maternal grandfather, maternal grandmother, and mother; Emphysema in his maternal grandfather; Heart disease in his father, paternal grandfather, and paternal grandmother; Hyperlipidemia in his maternal grandfather, maternal grandmother, and mother; Hypertension in his father; Stroke in his father.  Wt Readings from Last 3 Encounters:  08/18/18 186 lb (84.4 kg)  08/17/18 187 lb (84.8 kg)  07/04/18 187 lb (84.8 kg)   PHYSICAL EXAM BP 110/74   Pulse 88   Temp (!) 97.1 F (36.2 C)   Ht 6' (1.829 m)   Wt 187 lb (84.8 kg)   SpO2 98%   BMI 25.36 kg/m   Physical Exam  Constitutional: He is oriented to person, place, and time. He appears  well-developed and well-nourished. No distress.  Healthy-appearing.  Well-groomed.  In good spirits.  HENT:  Head: Normocephalic and atraumatic.  Mouth/Throat: No oropharyngeal exudate.  Eyes: EOM are normal. No scleral icterus.  Neck: Normal range of motion. Neck supple. No hepatojugular reflux and no JVD present. Carotid bruit is present (Cannot exclude soft bilateral bruits.  May be because of breathing).  Cardiovascular: Normal rate, regular rhythm, normal heart sounds and intact distal pulses. Exam reveals no gallop and no friction rub.  No murmur heard. Nondisplaced PMI  Pulmonary/Chest: Effort normal and breath sounds normal. No respiratory distress. He has no wheezes. He has no rales.  Mild diffuse interstitial sounds.  But no rales or rhonchi  Abdominal: Soft. Bowel sounds are normal. He exhibits no distension. There is no abdominal tenderness.  Musculoskeletal: Normal range of motion.        General: No edema.  Neurological: He is alert and oriented to person, place, and time. Cranial nerve deficit: Grossly intact.  Skin: Skin is warm and dry. No rash noted. No erythema.  No venous stasis changes  Psychiatric: He has a normal mood and affect. His behavior is normal. Judgment and thought content normal.  He is actually in a very good mood today.  Nursing note and vitals reviewed.   Adult ECG Report none  Other studies Reviewed: Additional studies/ records that were reviewed today include:  Recent Labs:    Lab Results  Component Value Date   CHOL 129 07/05/2018   HDL 30.90 (L) 07/05/2018   LDLCALC 43 01/12/2018   LDLDIRECT 53.0 07/05/2018   TRIG 346.0 (H) 07/05/2018   CHOLHDL 4 07/05/2018    ASSESSMENT / PLAN: Problem List Items Addressed This Visit    Hypertriglyceridemia    His LDL was great vibrators recent labs, but his triglycerides are high. Plan: Add fish oil-try to titrate up to a total of 3 g a day.  We will also add fenofibrate low-dose.  Would recheck  labs in about 6 months.      Relevant Medications   fenofibrate (TRICOR) 48 MG tablet   Hyperlipidemia with target LDL less than 70; by his report he is back on Crestor (Chronic)    Labs ordered by PCP.  LDL is great with rosuvastatin, but triglycerides elevated.  Added fish oil and fenofibrate.      Relevant Medications   fenofibrate (TRICOR) 48 MG tablet   Essential hypertension (Chronic)    Blood pressure well controlled on current meds.  Not able to tolerate ARB or ACE inhibitor.  Tolerating low-dose diltiazem well.      Relevant Medications   fenofibrate (TRICOR) 48 MG tablet   Coronary artery disease involving native coronary artery of native heart with angina pectoris (HCC) (Chronic)    Thankfully, he is doing much better now.  Angina is well controlled with combination of increased dose of Imdur and diltiazem added to Toprol.  Continue to monitor heart rate issues. He is on rosuvastatin and we are going to get adding fish oil plus fenofibrate. He is on Effient alone without aspirin.  This could be held for procedures by 5 to 7 days preop.      Relevant Medications   fenofibrate (TRICOR) 48 MG tablet   Chronotropic incompetence with left ventricular dysfunction --> improved with reduction of beta blocker (Chronic)    Despite having chronic issues with chronotropic incompetence, he does not do well off of rate control agents.  He has tachycardia spells leading to angina. He is therefore on a stable dose of 37.5 mg Toprol along with low-dose diltiazem. His heart rate seems to be okay right now and if necessary we could potentially titrate up to maybe 180 mg or 240 mg of diltiazem, but would not go further.  Need to watch for issues with bradycardia.      Relevant Medications   fenofibrate (TRICOR) 48 MG tablet   Chronic stable angina (HCC) (Chronic)   Relevant Medications   fenofibrate (TRICOR) 48 MG tablet   Chronic combined systolic and diastolic congestive heart failure,  NYHA class 2 (HCC) (Chronic)    Compensated, class I symptoms at worst.  On Toprol and diltiazem. Avoid excess salt, but no diuretic requirement. Follow daily weights.      Relevant Medications   fenofibrate (TRICOR) 48 MG tablet   Cardiomyopathy, ischemic; EF~45% with hypokinesis of the inferior and inferolateral myocardium; CO 3.25 - Primary (Chronic)    Euvolemic on exam.  No overt heart failure symptoms.  LVEDP normal by cath. Not able to tolerate ARB because of hypotension, is on metoprolol and low-dose diltiazem. No diuretic requirement.      Relevant Medications   fenofibrate (TRICOR) 48 MG tablet     --We did discuss smoking cessation, but every time he stops he goes back to it.  Currently maybe 1 or 2 cigarettes every couple days.  Current medicines are reviewed at length with the patient today. (+/- concerns) -  no problems with headache now taking Imdur  The following changes have been made:Adding fenofibrate and fish oil  Patient Instructions  Medication Instructions:  START TAKING FENOFIBRATE ONE TABLET  DAILY   PURCHASE OVER THE COUNTER  FISH OIL CAPSULE OR TABLET  - TAKE WITH MEALS START WITH 1 GRAM  ( 1 CAPSULE /TABLET)  TIME A DAY  FOR 1 MONTH THEN INCREASE TO 2 GRAMS ( 2 CAPSULES)  A DAY WITH MEALS FOR 1 MONTH  , THEN  INCREASE TO 3 GRAMS ( 3 CAPSULES) A DAY WITH MEALS   ( BEST TO PUT IN REFRIGERATOR)  If you need a refill on your cardiac medications before your next appointment, please call your pharmacy.   Lab work:  NOT NEEDED  Testing/Procedures: NOT NEEDED  Follow-Up: At [facility], you and your health needs are our priority.  As part of our continuing mission to provide you with exceptional heart care, we have created designated Provider Care Teams.  These Care Teams include your primary Cardiologist (physician) and Advanced Practice Providers (APPs -  Physician Assistants and Nurse Practitioners) who all work together to provide you with the  care you need, when you need it. . You will need a follow up appointment in JAN-FEB 2021.  Please call our office 2 months in advance to schedule this appointment.  You may see Glenetta Hew, MD or one of the following Advanced Practice Providers on your designated Care Team:   . Rosaria Ferries, PA-C . Jory Sims, DNP, ANP  Any Other Special Instructions Will Be Listed Below.    Glenetta Hew, M.D., M.S. Interventional Cardiologist   Pager # [phone] Phone # [phone] [address] [place], [place] [address]

## 2018-08-17 NOTE — Patient Instructions (Addendum)
Medication Instructions:  START TAKING FENOFIBRATE ONE TABLET  DAILY   PURCHASE OVER THE COUNTER  FISH OIL CAPSULE OR TABLET  - TAKE WITH MEALS START WITH 1 GRAM  ( 1 CAPSULE /TABLET)  TIME A DAY  FOR 1 MONTH THEN INCREASE TO 2 GRAMS ( 2 CAPSULES)  A DAY WITH MEALS FOR 1 MONTH  , THEN  INCREASE TO 3 GRAMS ( 3 CAPSULES) A DAY WITH MEALS   ( BEST TO PUT IN REFRIGERATOR)  If you need a refill on your cardiac medications before your next appointment, please call your pharmacy.   Lab work:  NOT NEEDED  Testing/Procedures: NOT NEEDED  Follow-Up: At [facility], you and your health needs are our priority.  As part of our continuing mission to provide you with exceptional heart care, we have created designated Provider Care Teams.  These Care Teams include your primary Cardiologist (physician) and Advanced Practice Providers (APPs -  Physician Assistants and Nurse Practitioners) who all work together to provide you with the care you need, when you need it. . You will need a follow up appointment in JAN-FEB 2021.  Please call our office 2 months in advance to schedule this appointment.  You may see Glenetta Hew, MD or one of the following Advanced Practice Providers on your designated Care Team:   . Rosaria Ferries, PA-C . Jory Sims, DNP, ANP  Any Other Special Instructions Will Be Listed Below.

## 2018-08-18 ENCOUNTER — Ambulatory Visit (INDEPENDENT_AMBULATORY_CARE_PROVIDER_SITE_OTHER): Payer: PPO | Admitting: Internal Medicine

## 2018-08-18 ENCOUNTER — Encounter: Payer: Self-pay | Admitting: Internal Medicine

## 2018-08-18 DIAGNOSIS — R1012 Left upper quadrant pain: Secondary | ICD-10-CM | POA: Diagnosis not present

## 2018-08-18 NOTE — Patient Instructions (Signed)
Abdominal Pain, Adult    Many things can cause belly (abdominal) pain. Most times, belly pain is not dangerous. Many cases of belly pain can be watched and treated at home. Sometimes belly pain is serious, though. Your doctor will try to find the cause of your belly pain.  Follow these instructions at home:  · Take over-the-counter and prescription medicines only as told by your doctor. Do not take medicines that help you poop (laxatives) unless told to by your doctor.  · Drink enough fluid to keep your pee (urine) clear or pale yellow.  · Watch your belly pain for any changes.  · Keep all follow-up visits as told by your doctor. This is important.  Contact a doctor if:  · Your belly pain changes or gets worse.  · You are not hungry, or you lose weight without trying.  · You are having trouble pooping (constipated) or have watery poop (diarrhea) for more than 2-3 days.  · You have pain when you pee or poop.  · Your belly pain wakes you up at night.  · Your pain gets worse with meals, after eating, or with certain foods.  · You are throwing up and cannot keep anything down.  · You have a fever.  Get help right away if:  · Your pain does not go away as soon as your doctor says it should.  · You cannot stop throwing up.  · Your pain is only in areas of your belly, such as the right side or the left lower part of the belly.  · You have bloody or black poop, or poop that looks like tar.  · You have very bad pain, cramping, or bloating in your belly.  · You have signs of not having enough fluid or water in your body (dehydration), such as:  ? Dark pee, very little pee, or no pee.  ? Cracked lips.  ? Dry mouth.  ? Sunken eyes.  ? Sleepiness.  ? Weakness.  This information is not intended to replace advice given to you by your health care provider. Make sure you discuss any questions you have with your health care provider.  Document Released: 07/14/2007 Document Revised: 08/15/2015 Document Reviewed: 07/09/2015  Elsevier  Interactive Patient Education © 2020 [employer] Inc.

## 2018-08-18 NOTE — Progress Notes (Signed)
Subjective:    Patient ID: Troy Parrish., male    DOB: 1960/09/12, 58 y.o.   MRN: [id number]  HPI  Patient presents to the clinic today with complaint of intermittent left-sided abdominal pain.  He reports this is been going on for months.  He reports the pain is under the left side of his ribs.  He describes the pain as sharp and very intense.  The pain does not radiate.  It does seem worse with movement.  He reports it does not change in relation to eating, urinating or having a bowel.  He denies nausea, vomiting, constipation or blood in the stool.  He has not taken anything over-the-counter for his symptoms.  Review of Systems      Past Medical History:  Diagnosis Date  . Bronchitis    Gets bronchitis almost every year  . CAD S/P percutaneous coronary angioplasty 06/2011; 6/ & 11/2013   S/P PCI to all 3 major vessels;a)  Ant STEMI 2001- BMS-LAD x2 -->(redo PCI 6/'15 -- pLAD Xience DES 2.5 x 12- 2.75 mm, mLAD 2.25 x 12 - 2.7 mm), b) '06 UA --> Cx- OM DES; c) 2013 Inf MI BMS mRCA --> d) 10/'15 PCI dRCA Promus P DES 4.0 x 16 (4.25 mm); PTCA of RPL2 (2.0 mm) &RPDA (2.25 mm) 11/2013; 4/18 PCI OM1 Synergy DES  2.5 x 16  . COPD (chronic obstructive pulmonary disease) ([place])   . Elevated WBC count   . Emphysema of lung ([place])   . Essential hypertension 07/07/2011  . Former heavy tobacco smoker     quit in May 2015 after multiple attempts at trying to quit before   . GERD (gastroesophageal reflux disease)   . Glucose intolerance (pre-diabetes) June 2015    hemoglobin A1c 6.6  . Headache    "Imdur related; stopped taking it; headaches went away" (11/15/2013)  . Heart murmur   . History of colon polyps   . History of stomach ulcers   . Hyperlipidemia with target LDL less than 70 07/07/2011  . Metabolic syndrome    Pre-diabetes, hypertension and truncal obesity as well as dyslipidemia  . OSA on CPAP   . Pneumonia "several times"  . Recurrent upper respiratory infection (URI)   . Seizures  ([place]) "several"   "last one was ~ 2011" (11/15/2013)  . Shortness of breath dyspnea   . ST elevation myocardial infarction (STEMI) of anterior wall ([place]) 2001   History of -- 2 stents in early and distal mid LAD; prior cardiologist was Dr. Remi Haggard in [place]  . ST elevation myocardial infarction (STEMI) of inferior wall ([place]) 07/07/2011   Occluded RCA 0.97D53 INTEGRITY; Echo 07/2013: EF 45-50%, Inferior & Posterolateral HK.   Marland Kitchen Umbilical hernia   . Unstable angina ([place]) 2006; 07/2013   Cx-OM - PCI 2.5 mm 13 mm Cypher DES [name], [place]) ; 07/2013: Severe ISR of both prox & Distal LAD stentS --> 2 DES stents (1 at prox edge of the proximal stent, 2nd covers the entire distal stent as well as proximal and distal edge stenose)   . Upper respiratory infection     Current Outpatient Medications  Medication Sig Dispense Refill  . albuterol (PROAIR HFA) 108 (90 Base) MCG/ACT inhaler INHALE TWO PUFFS BY MOUTH EVERY 6 HOURS AS NEEDED FOR SHORTNESS OF BREATH 8.5 g 10  . clobetasol cream (TEMOVATE) 0.05 % APPLY TOPICALLY TO AFFECTED AREA(S) DAILY AS NEEDED FOR ECZEMA (Patient taking differently: Apply 1 application topically daily as  needed (eczema). ) 30 g 4  . diltiazem (CARDIZEM CD) 120 MG 24 hr capsule Take 1 capsule (120 mg total) by mouth every morning. 30 capsule 6  . EFFIENT 10 MG TABS tablet Take 1 tablet (10 mg total) by mouth every evening. 30 tablet 3  . fenofibrate (TRICOR) 48 MG tablet Take 1 tablet (48 mg total) by mouth daily. 30 tablet 6  . fluticasone (FLONASE) 50 MCG/ACT nasal spray Place 2 sprays into both nostrils daily. (Patient taking differently: Place 2 sprays into both nostrils daily as needed (congestion). ) 16 g 0  . Fluticasone-Salmeterol (ADVAIR) 100-50 MCG/DOSE AEPB INHALE ONE PUFF BY MOUTH TWICE A DAY 60 each 0  . isosorbide mononitrate (IMDUR) 60 MG 24 hr tablet Take 1 tablet (60 mg total) by mouth daily. TAKE 30 MINUTES AFTER DAILY ASPIRIN 30 tablet 6  . Melatonin  1 MG CAPS Take 1 mg by mouth at bedtime as needed (sleep).    . metoprolol succinate (TOPROL XL) 25 MG 24 hr tablet Take 1.5 tablets (37.5 mg total) by mouth daily. (Patient taking differently: Take 37.5 mg by mouth every evening. ) 45 tablet 6  . nitroGLYCERIN (NITROSTAT) 0.4 MG SL tablet Place 1 tablet (0.4 mg total) under the tongue every 5 (five) minutes as needed for chest pain. 25 tablet 3  . Omega-3 Fatty Acids (FISH OIL) 1000 MG CPDR Take 3 g by mouth daily. 30 capsule 11  . omeprazole (PRILOSEC) 20 MG capsule TAKE ONE CAPSULE BY MOUTH DAILY 30 capsule 3  . rosuvastatin (CRESTOR) 40 MG tablet TAKE ONE TABLET BY MOUTH DAILY 90 tablet 0   No current facility-administered medications for this visit.     Allergies  Allergen Reactions  . Niacin And Related Hives and Itching    Family History  Problem Relation Age of Onset  . Coronary artery disease Father   . Heart disease Father   . Stroke Father   . Hypertension Father   . Hyperlipidemia Mother   . Diabetes Mother   . Hyperlipidemia Maternal Grandmother   . Diabetes Maternal Grandmother   . Hyperlipidemia Maternal Grandfather   . Diabetes Maternal Grandfather   . Emphysema Maternal Grandfather   . Heart disease Paternal Grandmother   . Heart disease Paternal Grandfather   . Cancer Maternal Aunt        breast    Social History   Socioeconomic History  . Marital status: Married    Spouse name: Not on file  . Number of children: Not on file  . Years of education: Not on file  . Highest education level: Not on file  Occupational History  . Not on file  Social Needs  . Financial resource strain: Not on file  . Food insecurity    Worry: Not on file    Inability: Not on file  . Transportation needs    Medical: Not on file    Non-medical: Not on file  Tobacco Use  . Smoking status: Current Every Day Smoker    Packs/day: 0.50    Years: 40.00    Pack years: 20.00    Types: Cigarettes    Last attempt to quit:  01/19/2017    Years since quitting: 1.5  . Smokeless tobacco: Never Used  . Tobacco comment: Quit with Bupropion '150mg'$  - > unfortunately, he restarted  Substance and Sexual Activity  . Alcohol use: Yes    Comment: rare  . Drug use: No  . Sexual activity: Not on file  Lifestyle  . Physical activity    Days per week: Not on file    Minutes per session: Not on file  . Stress: Not on file  Relationships  . Social Herbalist on phone: Not on file    Gets together: Not on file    Attends religious service: Not on file    Active member of club or organization: Not on file    Attends meetings of clubs or organizations: Not on file    Relationship status: Not on file  . Intimate partner violence    Fear of current or ex partner: Not on file    Emotionally abused: Not on file    Physically abused: Not on file    Forced sexual activity: Not on file  Other Topics Concern  . Not on file  Social History Narrative   He is a married father of 58, grandfather of 28. He does not really get routine exercise.    He is down to 5 or 6 cigarettes a day. He is very seriously wanting to quit. This is a significant cutback for him, but he has pretty much been told he needs to stop by his wife, and so he fully intends to do so. He is willing to try the patches or whatever. He has a social alcoholic beverage every now and then.      Constitutional: Denies fever, malaise, fatigue, headache or abrupt weight changes.  Respiratory: Denies difficulty breathing, shortness of breath, cough or sputum production.   Cardiovascular: Denies chest pain, chest tightness, palpitations or swelling in the hands or feet.  Gastrointestinal: Patient reports abdominal pain. Denies bloating, constipation, diarrhea or blood in the stool.  GU: Denies urgency, frequency, pain with urination, burning sensation, blood in urine, odor or discharge. Musculoskeletal: Denies decrease in range of motion, difficulty with gait,  muscle pain or joint pain and swelling.  Skin: Denies redness, rashes, lesions or ulcercations.   No other specific complaints in a complete review of systems (except as listed in HPI above).  Objective:   Physical Exam   BP 122/82 (BP Location: Left Arm, Patient Position: Sitting, Cuff Size: Normal)   Pulse 94   Temp 98.1 F (36.7 C) (Temporal)   Ht 6' (1.829 m)   Wt 186 lb (84.4 kg)   SpO2 96%   BMI 25.23 kg/m  Wt Readings from Last 3 Encounters:  08/18/18 186 lb (84.4 kg)  08/17/18 187 lb (84.8 kg)  07/04/18 187 lb (84.8 kg)    General: Appears his stated age, well developed, well nourished in NAD. Skin: Warm, dry and intact. No rashes noted. Abdomen: Soft and mildly tender in the left upper quad.  No distention or masses noted.  Active bowel sounds.  No CVA tenderness.   BMET    Component Value Date/Time   NA 140 07/05/2018 1017   NA 140 02/09/2018 1121   NA 139 10/24/2013 1050   K 4.5 07/05/2018 1017   K 3.8 10/24/2013 1050   CL 106 07/05/2018 1017   CL 107 10/24/2013 1050   CO2 26 07/05/2018 1017   CO2 24 10/24/2013 1050   GLUCOSE 138 (H) 07/05/2018 1017   GLUCOSE 108 (H) 10/24/2013 1050   BUN 13 07/05/2018 1017   BUN 11 02/09/2018 1121   BUN 8 10/24/2013 1050   CREATININE 0.86 07/05/2018 1017   CREATININE 1.01 05/13/2016 1044   CALCIUM 9.1 07/05/2018 1017   CALCIUM 8.2 (L) 10/24/2013 1050  GFRNONAA 97 02/09/2018 1121   GFRNONAA >60 10/24/2013 1050   GFRAA 112 02/09/2018 1121   GFRAA >60 10/24/2013 1050    Lipid Panel     Component Value Date/Time   CHOL 129 07/05/2018 1017   CHOL 118 01/12/2018 0915   TRIG 346.0 (H) 07/05/2018 1017   HDL 30.90 (L) 07/05/2018 1017   HDL 32 (L) 01/12/2018 0915   CHOLHDL 4 07/05/2018 1017   VLDL 69.2 (H) 07/05/2018 1017   LDLCALC 43 01/12/2018 0915    CBC    Component Value Date/Time   WBC 14.1 (H) 07/05/2018 1017   RBC 5.49 07/05/2018 1017   HGB 17.0 07/05/2018 1017   HGB 16.1 02/09/2018 1121   HCT  48.9 07/05/2018 1017   HCT 48.5 02/09/2018 1121   PLT 375.0 07/05/2018 1017   PLT 461 (H) 02/09/2018 1121   MCV 89.1 07/05/2018 1017   MCV 88 02/09/2018 1121   MCV 89 10/24/2013 1050   MCH 29.1 02/09/2018 1121   MCH 30.4 05/13/2016 1044   MCHC 34.7 07/05/2018 1017   RDW 15.0 07/05/2018 1017   RDW 13.3 02/09/2018 1121   RDW 14.5 10/24/2013 1050   LYMPHSABS 4.6 (H) 04/04/2015 1040   LYMPHSABS 2.8 11/07/2013 1509   MONOABS 0.9 04/04/2015 1040   EOSABS 0.1 04/04/2015 1040   EOSABS 0.2 11/07/2013 1509   BASOSABS 0.1 04/04/2015 1040   BASOSABS 0.1 11/07/2013 1509    Hgb A1C Lab Results  Component Value Date   HGBA1C 6.7 (H) 07/05/2018          Assessment & Plan:   LUQ Abdominal Pain:  Recent CBC, C met, Amylase and Lipase reviewed Will obtain CT scan of abdomen with contrast time  We will follow-up after imaging results are back.  Return precautions discussed. Webb Silversmith, NP

## 2018-08-19 ENCOUNTER — Encounter: Payer: Self-pay | Admitting: Cardiology

## 2018-08-19 NOTE — Assessment & Plan Note (Signed)
Compensated, class I symptoms at worst.  On Toprol and diltiazem. Avoid excess salt, but no diuretic requirement. Follow daily weights.

## 2018-08-19 NOTE — Assessment & Plan Note (Addendum)
His LDL was great vibrators recent labs, but his triglycerides are high. Plan: Add fish oil-try to titrate up to a total of 3 g a day.  We will also add fenofibrate low-dose.  Would recheck labs in about 6 months.

## 2018-08-19 NOTE — Assessment & Plan Note (Signed)
Thankfully, he is doing much better now.  Angina is well controlled with combination of increased dose of Imdur and diltiazem added to Toprol.  Continue to monitor heart rate issues. He is on rosuvastatin and we are going to get adding fish oil plus fenofibrate. He is on Effient alone without aspirin.  This could be held for procedures by 5 to 7 days preop.

## 2018-08-19 NOTE — Assessment & Plan Note (Signed)
Euvolemic on exam.  No overt heart failure symptoms.  LVEDP normal by cath. Not able to tolerate ARB because of hypotension, is on metoprolol and low-dose diltiazem. No diuretic requirement.

## 2018-08-19 NOTE — Assessment & Plan Note (Signed)
Despite having chronic issues with chronotropic incompetence, he does not do well off of rate control agents.  He has tachycardia spells leading to angina. He is therefore on a stable dose of 37.5 mg Toprol along with low-dose diltiazem. His heart rate seems to be okay right now and if necessary we could potentially titrate up to maybe 180 mg or 240 mg of diltiazem, but would not go further.  Need to watch for issues with bradycardia.

## 2018-08-19 NOTE — Assessment & Plan Note (Signed)
Labs ordered by PCP.  LDL is great with rosuvastatin, but triglycerides elevated.  Added fish oil and fenofibrate.

## 2018-08-19 NOTE — Assessment & Plan Note (Signed)
Blood pressure well controlled on current meds.  Not able to tolerate ARB or ACE inhibitor.  Tolerating low-dose diltiazem well.

## 2018-08-21 NOTE — Addendum Note (Signed)
Addended by: Venetia Maxon on: 08/21/2018 03:17 PM   Modules accepted: Orders

## 2018-08-24 ENCOUNTER — Other Ambulatory Visit: Payer: Self-pay

## 2018-08-24 ENCOUNTER — Ambulatory Visit
Admission: RE | Admit: 2018-08-24 | Discharge: 2018-08-24 | Disposition: A | Discharge status: Home / Self Care | Payer: PPO | Source: Ambulatory Visit | Attending: Internal Medicine | Admitting: Internal Medicine

## 2018-08-24 DIAGNOSIS — K5730 Diverticulosis of large intestine without perforation or abscess without bleeding: Secondary | ICD-10-CM | POA: Diagnosis not present

## 2018-08-24 DIAGNOSIS — K760 Fatty (change of) liver, not elsewhere classified: Secondary | ICD-10-CM | POA: Diagnosis not present

## 2018-08-24 DIAGNOSIS — R1012 Left upper quadrant pain: Secondary | ICD-10-CM

## 2018-08-24 DIAGNOSIS — K7689 Other specified diseases of liver: Secondary | ICD-10-CM | POA: Diagnosis not present

## 2018-08-24 LAB — POCT I-STAT CREATININE: Creatinine, Ser: 0.8 mg/dL (ref 0.61–1.24)

## 2018-08-24 MED ORDER — IOHEXOL 300 MG/ML  SOLN
100.0000 mL | Freq: Once | INTRAMUSCULAR | Status: AC | PRN
  Administered 2018-08-24: 100 mL via INTRAVENOUS

## 2018-08-27 ENCOUNTER — Other Ambulatory Visit: Payer: Self-pay | Admitting: Internal Medicine

## 2018-08-27 DIAGNOSIS — I5042 Chronic combined systolic (congestive) and diastolic (congestive) heart failure: Secondary | ICD-10-CM

## 2018-09-05 ENCOUNTER — Other Ambulatory Visit: Payer: Self-pay | Admitting: Cardiology

## 2018-09-26 ENCOUNTER — Other Ambulatory Visit: Payer: Self-pay | Admitting: Internal Medicine

## 2018-09-26 DIAGNOSIS — I5042 Chronic combined systolic (congestive) and diastolic (congestive) heart failure: Secondary | ICD-10-CM

## 2018-09-28 ENCOUNTER — Other Ambulatory Visit: Payer: Self-pay | Admitting: Cardiovascular Disease

## 2018-10-23 ENCOUNTER — Other Ambulatory Visit: Payer: Self-pay | Admitting: Internal Medicine

## 2018-10-23 DIAGNOSIS — I5042 Chronic combined systolic (congestive) and diastolic (congestive) heart failure: Secondary | ICD-10-CM

## 2018-11-23 ENCOUNTER — Other Ambulatory Visit: Payer: Self-pay | Admitting: Internal Medicine

## 2018-11-23 DIAGNOSIS — I5042 Chronic combined systolic (congestive) and diastolic (congestive) heart failure: Secondary | ICD-10-CM

## 2018-12-07 ENCOUNTER — Ambulatory Visit (INDEPENDENT_AMBULATORY_CARE_PROVIDER_SITE_OTHER): Payer: PPO

## 2018-12-07 DIAGNOSIS — Z23 Encounter for immunization: Secondary | ICD-10-CM

## 2018-12-12 ENCOUNTER — Other Ambulatory Visit: Payer: Self-pay | Admitting: Internal Medicine

## 2018-12-12 ENCOUNTER — Other Ambulatory Visit: Payer: Self-pay | Admitting: Cardiology

## 2018-12-12 DIAGNOSIS — I5042 Chronic combined systolic (congestive) and diastolic (congestive) heart failure: Secondary | ICD-10-CM

## 2019-01-09 ENCOUNTER — Other Ambulatory Visit: Payer: Self-pay | Admitting: Cardiology

## 2019-01-28 ENCOUNTER — Other Ambulatory Visit: Payer: Self-pay | Admitting: Internal Medicine

## 2019-01-28 DIAGNOSIS — I5042 Chronic combined systolic (congestive) and diastolic (congestive) heart failure: Secondary | ICD-10-CM

## 2019-02-14 ENCOUNTER — Other Ambulatory Visit: Payer: Self-pay | Admitting: Cardiology

## 2019-03-03 ENCOUNTER — Other Ambulatory Visit: Payer: Self-pay | Admitting: Internal Medicine

## 2019-03-03 DIAGNOSIS — I5042 Chronic combined systolic (congestive) and diastolic (congestive) heart failure: Secondary | ICD-10-CM

## 2019-03-13 ENCOUNTER — Other Ambulatory Visit: Payer: Self-pay | Admitting: Internal Medicine

## 2019-03-28 ENCOUNTER — Other Ambulatory Visit: Payer: Self-pay | Admitting: Cardiology

## 2019-03-28 ENCOUNTER — Encounter: Payer: Self-pay | Admitting: Cardiology

## 2019-03-28 ENCOUNTER — Ambulatory Visit (INDEPENDENT_AMBULATORY_CARE_PROVIDER_SITE_OTHER): Payer: PPO | Admitting: Cardiology

## 2019-03-28 ENCOUNTER — Other Ambulatory Visit: Payer: Self-pay

## 2019-03-28 VITALS — BP 122/90 | HR 90 | Temp 96.8°F | Ht 72.0 in | Wt 191.0 lb

## 2019-03-28 DIAGNOSIS — I255 Ischemic cardiomyopathy: Secondary | ICD-10-CM

## 2019-03-28 DIAGNOSIS — I5042 Chronic combined systolic (congestive) and diastolic (congestive) heart failure: Secondary | ICD-10-CM | POA: Diagnosis not present

## 2019-03-28 DIAGNOSIS — E785 Hyperlipidemia, unspecified: Secondary | ICD-10-CM

## 2019-03-28 DIAGNOSIS — I208 Other forms of angina pectoris: Secondary | ICD-10-CM

## 2019-03-28 DIAGNOSIS — I10 Essential (primary) hypertension: Secondary | ICD-10-CM | POA: Diagnosis not present

## 2019-03-28 DIAGNOSIS — I2111 ST elevation (STEMI) myocardial infarction involving right coronary artery: Secondary | ICD-10-CM | POA: Diagnosis not present

## 2019-03-28 DIAGNOSIS — Z716 Tobacco abuse counseling: Secondary | ICD-10-CM

## 2019-03-28 DIAGNOSIS — I25119 Atherosclerotic heart disease of native coronary artery with unspecified angina pectoris: Secondary | ICD-10-CM

## 2019-03-28 DIAGNOSIS — F17210 Nicotine dependence, cigarettes, uncomplicated: Secondary | ICD-10-CM

## 2019-03-28 DIAGNOSIS — I2089 Other forms of angina pectoris: Secondary | ICD-10-CM

## 2019-03-28 MED ORDER — EFFIENT 10 MG PO TABS: 10.0000 mg | ORAL_TABLET | Freq: Every evening | ORAL | 3 refills | Status: DC

## 2019-03-28 MED ORDER — VARENICLINE TARTRATE 1 MG PO TABS: 1.0000 mg | ORAL_TABLET | Freq: Two times a day (BID) | ORAL | 2 refills | Status: DC

## 2019-03-28 MED ORDER — CHANTIX STARTING MONTH PAK 0.5 MG X 11 & 1 MG X 42 PO TABS: ORAL_TABLET | ORAL | 0 refills | Status: DC

## 2019-03-28 MED ORDER — DILTIAZEM HCL ER COATED BEADS 120 MG PO CP24: 120.0000 mg | ORAL_CAPSULE | ORAL | 3 refills | Status: DC

## 2019-03-28 NOTE — Progress Notes (Signed)
Primary Care Provider: Jearld Fenton, NP Cardiologist: Glenetta Hew, MD Electrophysiologist: None  Clinic Note: Chief Complaint  Patient presents with  . Follow-up    6-7 months.  . Coronary Artery Disease    No angina  . Nicotine Dependence    HPI:    Troy Parrish. is a 59 y.o. male with a detailed cardiac history (multiple ST elevation MIs and unstable angina presentations with ischemic cardiomyopathy (essentially all major arteries have PCI) described below who presents today for 44-monthfollow-up.  He has a very significant multivessel coronary disease history with at least 3 STEMI's and at least 3 if not 4 episodes of progressive /unstable angina leading to additional PCI (now all 3 major arteries s/p PCI). He also has very difficult/poorly controlled lipids. He intermittently goes back to smoking, but is now in the process of trying to complete his quitting.  Has had blood pressure issues with adding ARB or amlodipine, therefore we changed to diltiazem  Unable to afford Ranexa in the past   Most recent cardiac catheterization was in January 2020 (reviewed below) showing stable coronary disease with patent stents.  Only mild progression of disease in the very proximal LAD but not physiologically significant.  RKalyb Pemble was last seen in July 2020.  He noted that he felt much better after he went from amlodipine to diltiazem.  He had less fatigue than he had had with the higher dose beta-blocker, and the lack of hypotension and dizziness was also better.  His rapid heart rate spell was essentially cleared up and he had much less of the exertional chest pain.  He was happy about being on to go on a trip with his family and hiking around hills etc.  No heart failure symptoms.  Biggest issue is working on smoking cessation.  Recent Hospitalizations: None  Reviewed  CV studies:    The following studies were reviewed today: (if available, images/films reviewed: From  Epic Chart or Care Everywhere) . None:   Interval History:   Troy Parrish returns here today actually feeling quite well.  He is in good spirits.  Seems to be motivated to continue to feel good.  He is increasing his level activity has not had any further chest pain or dyspnea on exertion since I saw him last.  Feeling well overall.  He is very excited about his and his son having started college.  His biggest thing he wants to do now is fully quit smoking because it is the one thing that is slowing him down.  He truthfully does not like the coughing every day and the shortness of breath associated with it.  He just has a hard time making that final stop.  He is asking about using Chantix indicating that the last time he tried it it worked well, he just had some social issues that got him back in the smoking.  He is ready to make the final jump now.  Thankfully, he is totally asymptomatic from cardiac standpoint with no major issues.  CV Review of Symptoms (Summary): no chest pain or dyspnea on exertion positive for - off & on palpitations, some DOE related to smoking  negative for - edema, orthopnea, paroxysmal nocturnal dyspnea, rapid heart rate, shortness of breath or syncope/ near syncope; TIA/ amaurosis fugax; claudication  The patient does not have symptoms concerning for COVID-19 infection (fever, chills, cough, or new shortness of breath).  The patient is practicing social distancing &  Masking.  He is waiting for the COVID vaccine age requirement to include his wife's age - wants to both get the shots together.    REVIEWED OF SYSTEMS   Review of Systems  Constitutional: Negative for chills, fever and malaise/fatigue.  HENT: Positive for congestion (Off and on). Negative for nosebleeds.   Respiratory: Positive for cough (Daily cough-smoker's cough) and wheezing.   Cardiovascular: Negative for claudication.  Gastrointestinal: Positive for heartburn. Negative for blood in stool  and melena.  Genitourinary: Negative for frequency and hematuria.  Musculoskeletal: Negative for joint pain.  Neurological: Negative for dizziness and focal weakness.  Endo/Heme/Allergies: Positive for environmental allergies (Not as bad this year).  Psychiatric/Behavioral: Negative for depression (Much less symptoms now) and memory loss. The patient does not have insomnia.    I have reviewed and (if needed) personally updated the patient's problem list, medications, allergies, past medical and surgical history, social and family history.   PAST MEDICAL HISTORY   Past Medical History:  Diagnosis Date  . Bronchitis    Gets bronchitis almost every year  . CAD S/P percutaneous coronary angioplasty 06/2011; 6/ & 11/2013   S/P PCI to all 3 major vessels;a)  Ant STEMI 2001- BMS-LAD x2 -->(redo PCI 6/'15 -- pLAD Xience DES 2.5 x 12- 2.75 mm, mLAD 2.25 x 12 - 2.7 mm), b) '06 UA --> Cx- OM DES; c) 2013 Inf MI BMS mRCA --> d) 10/'15 PCI dRCA Promus P DES 4.0 x 16 (4.25 mm); PTCA of RPL2 (2.0 mm) &RPDA (2.25 mm) 11/2013; 4/18 PCI OM1 Synergy DES  2.5 x 16  . COPD (chronic obstructive pulmonary disease) ([place])   . Elevated WBC count   . Emphysema of lung ([place])   . Essential hypertension 07/07/2011  . Former heavy tobacco smoker     quit in May 2015 after multiple attempts at trying to quit before   . GERD (gastroesophageal reflux disease)   . Glucose intolerance (pre-diabetes) June 2015    hemoglobin A1c 6.6  . Headache    "Imdur related; stopped taking it; headaches went away" (11/15/2013)  . Heart murmur   . History of colon polyps   . History of stomach ulcers   . Hyperlipidemia with target LDL less than 70 07/07/2011  . Metabolic syndrome    Pre-diabetes, hypertension and truncal obesity as well as dyslipidemia  . OSA on CPAP   . Pneumonia "several times"  . Recurrent upper respiratory infection (URI)   . Seizures ([place]) "several"   "last one was ~ 2011" (11/15/2013)  . Shortness of breath  dyspnea   . ST elevation myocardial infarction (STEMI) of anterior wall ([place]) 2001   History of -- 2 stents in early and distal mid LAD; prior cardiologist was Dr. Remi Haggard in [place]  . ST elevation myocardial infarction (STEMI) of inferior wall ([place]) 07/07/2011   Occluded RCA 7.61Y07 INTEGRITY; Echo 07/2013: EF 45-50%, Inferior & Posterolateral HK.   Marland Kitchen Umbilical hernia   . Unstable angina ([place]) 2006; 07/2013   Cx-OM - PCI 2.5 mm 13 mm Cypher DES [name], R[place]) ; 07/2013: Severe ISR of both prox & Distal LAD stentS --> 2 DES stents (1 at prox edge of the proximal stent, 2nd covers the entire distal stent as well as proximal and distal edge stenose)   . Upper respiratory infection     PAST SURGICAL HISTORY   Past Surgical History:  Procedure Laterality Date  . Cardiac MRI [facility]  09/2014   EF 51%. Mod HK  of basal-mid Inf wall, mild HK of basal inferoseptum & basal-mid inferolateral wall with hyper enhancement (c/w subendocard RCA MI with significant viability), focal hyper enhancement of apical wall c/w dLAD subendocard MI & complete LAD viability. . No aortic stenosis. Normal RV function. No Ischemia on Adenosine Stress.  . COLONOSCOPY WITH PROPOFOL N/A 03/28/2015   Procedure: COLONOSCOPY WITH PROPOFOL;  Surgeon: Lucilla Lame, MD;  Location: [place];  Service: Endoscopy;  Laterality: N/A;  . CORONARY ANGIOPLASTY WITH STENT PLACEMENT  2001   ANTERIOR mi with a  PCI to LAD; [place], [place] - Dr. Dorris Fetch  . CORONARY ANGIOPLASTY WITH STENT PLACEMENT  2006   [place] by Dr Dorris Fetch -lesion lft circ/OM1 with 2.5 x 50m Cypher DES  . CORONARY STENT INTERVENTION N/A 05/19/2016   Procedure: Coronary Stent Intervention;  Surgeon: DLeonie Man MD;  [place] [facility] [place];  Service: Cardiovascular: PCI pOM1 - Synergy DES 2.5 x 16 (overlaps old stent)  . ESOPHAGOGASTRODUODENOSCOPY (EGD) WITH PROPOFOL N/A 03/28/2015   Procedure: ESOPHAGOGASTRODUODENOSCOPY (EGD) WITH PROPOFOL;   Surgeon: DLucilla Lame MD;  [place]  Service: Endoscopy;  Laterality: N/A;  . INSERTION OF MESH N/A 04/02/2015   Procedure: INSERTION OF MESH;  Surgeon: RFlorene Glen MD;  Location: [facility];  Service: General;  Laterality: N/A;  . LEFT AND RIGHT HEART CATHETERIZATION WITH CORONARY ANGIOGRAM N/A 07/30/2013   Procedure: LEFT AND RIGHT HEART CATHETERIZATION WITH CORONARY ANGIOGRAM;  Surgeon: DLeonie Man MD;  [facility];  Service: Cardiovascular;  2 separate P & mLAD lesions -> PCI, MOd-Severe dRCA disesase with diffuse RPL/PDA disease (FFR)  . LEFT HEART CATH  08/03/2013   Procedure: LEFT HEART CATH;  Surgeon: DLeonie Man MD;  [facility];  Service: Cardiovascular;;FFR of RCA - non-flow-limiting  . LEFT HEART CATH AND CORONARY ANGIOGRAPHY N/A 05/19/2016   Procedure: Left Heart Cath and Coronary Angiography;  Surgeon: DLeonie Man MD;  [place];  Service: Cardiovascular: CULPRIT - 85% pOM1 pre-stent. Stents in both proximal and distal RCA widely patent. PTCA sites in RPL & PDA widely patent with stable 70% small RPL 2. Extensive stenting in the LAD widely patent stents with mild to mod Dz btw prox & mid-distal stents.   .Marland KitchenLEFT HEART CATH AND CORONARY ANGIOGRAPHY N/A 02/13/2018   Procedure: LEFT HEART CATH AND CORONARY ANGIOGRAPHY;  Surgeon: HLeonie Man MD;  [facility];  Service: Cardiovascular: Similar findings to last cath post PCI.  Stents are patent.  Roughly 50% proximal LAD lesion noted, may be progression from prior cath but otherwise stable disease.  .Marland KitchenLEFT HEART CATHETERIZATION WITH CORONARY ANGIOGRAM N/A 07/07/2011   Procedure: LEFT HEART CATHETERIZATION WITH CORONARY ANGIOGRAM;  Surgeon: DLeonie Man MD;  [facility];  Service: Cardiovascular;  inferolat post LV infarct ST-elevation MI  . MET/CPET  11/09/2011   submax. effort 1.05 RER peak V02 was 51% ,chronotropic incomp.hrt rate lows  80s to 100  . MET/CPET  September 2016   [facility]: Mild functional impairment due primarily to mild pulmonary and circulatory limitations. Also suggest physical deconditioning (seen with low-normal aerobic reserve). Mild VQ mismatch with exercise.  Ventilatory reserve exhausted with peak exercise demonstrated pulmonary limitation. No significant decrease in postexercise FEV1 compared to rest --> suggest no exercise induced bronchospasm  . NM MYOVIEW ([place])  11/30/2011   EF 45% ,exercise 10 METS, infarct\scar w mild perinfarct ischemia -basal inferolat and  mid inferolat region  . NM MYOVIEW LTD  04/25/2016   Exercised for 9 minutes. Reached 90% max. Heart rate. 9.4 METS. Read as a low risk study with normal EF 55-65%. -> on Review -  there does appear to be a small to moderate sized, medium severtiy reversible perfusion defect in the anterior wall - read by computer, but not noted by reader.  Marland Kitchen PERCUTANEOUS CORONARY STENT INTERVENTION (PCI-S)  07/07/2011   Procedure: PERCUTANEOUS CORONARY STENT INTERVENTION (PCI-S);  Surgeon: Leonie Man, MD;  Location: [facility] LAB;  Service: Cardiovascular;;occluded RCA ,Integrity Resolute DES 2.75 x 18 mm stent - post-dilated 3.1 mm  . PERCUTANEOUS CORONARY STENT INTERVENTION (PCI-S)  07/30/2013   Procedure: PERCUTANEOUS CORONARY STENT INTERVENTION (PCI-S);  Surgeon: Leonie Man, MD;  Location: [facility] LAB;  Service: Cardiovascular;;Distal mid LAD: Xience Alpine DES 2.25 mm x 28 mm (overlapping proximal and distal edge of previous stent) - 2.7 mm; proximal LAD 2.5 mm x 12 mm Xience Alpine DES (2.8 mm);; RCA 60-70% stenosis planned staged procedure  . PERCUTANEOUS CORONARY STENT INTERVENTION (PCI-S) N/A 11/15/2013   Procedure: PERCUTANEOUS CORONARY STENT INTERVENTION (PCI-S);  Surgeon: Leonie Man, MD;  Location: [facility] CATH LAB;  Service: Cardiovascular;  Patent Cx & LAD Stents; PCI -dRCA Promus Premier DES 4.0 mm x 16 mm (4.25 mm) dRCA, 2.0 mm Cutting PTCA of RPL2  Ostium & POBA of mRPDA  . POLYPECTOMY  03/28/2015   Procedure: POLYPECTOMY;  Surgeon: Lucilla Lame, MD;  Location: [place];  Service: Endoscopy;;  . RIGHT HEART CATH  June 2015   Normal pressures with severely reduced cardiac output and index. (3.25/1.55)  . TRANSTHORACIC ECHOCARDIOGRAM  07/31/2013; February 2017   a. LVEF 45-50. Inferior posterior hypokinesis with mild dilation. Apparent normal diastolic pressures;   . UMBILICAL HERNIA REPAIR N/A 04/02/2015   Procedure: HERNIA REPAIR UMBILICAL ADULT;  Surgeon: Florene Glen, MD;  Location: [facility];  Service: General;  Laterality: N/A;    MEDICATIONS/ALLERGIES   Current Meds  Medication Sig  . albuterol (VENTOLIN HFA) 108 (90 Base) MCG/ACT inhaler INHALE TWO PUFFS INTO THE LUNGS EVERY 6 HOURS AS NEEDED FOR SHORTNESS OF BREATH  . clobetasol cream (TEMOVATE) 0.05 % APPLY TOPICALLY TO AFFECTED AREA(S) DAILY AS NEEDED FOR ECZEMA (Patient taking differently: Apply 1 application topically daily as needed (eczema). )  . diltiazem (CARDIZEM CD) 120 MG 24 hr capsule Take 1 capsule (120 mg total) by mouth every morning.  Marland Kitchen EFFIENT 10 MG TABS tablet Take 1 tablet (10 mg total) by mouth every evening.  . fenofibrate (TRICOR) 48 MG tablet TAKE ONE TABLET BY MOUTH DAILY  . fluticasone (FLONASE) 50 MCG/ACT nasal spray Place 2 sprays into both nostrils daily. (Patient taking differently: Place 2 sprays into both nostrils daily as needed (congestion). )  . Fluticasone-Salmeterol (ADVAIR) 100-50 MCG/DOSE AEPB INHALE ONE PUFF BY MOUTH TWICE A DAY  . Melatonin 1 MG CAPS Take 1 mg by mouth at bedtime as needed (sleep).  . metoprolol succinate (TOPROL XL) 25 MG 24 hr tablet Take 1.5 tablets (37.5 mg total) by mouth daily. (Patient taking differently: Take 37.5 mg by mouth every evening. )  . nitroGLYCERIN (NITROSTAT) 0.4 MG SL tablet Place 1 tablet (0.4 mg total) under the tongue every 5 (five) minutes as needed for chest pain.  . Omega-3 Fatty Acids  (FISH OIL) 1000 MG CPDR Take 3 g by mouth daily.  Marland Kitchen omeprazole (PRILOSEC) 20 MG capsule TAKE ONE CAPSULE BY MOUTH DAILY  .  rosuvastatin (CRESTOR) 40 MG tablet TAKE ONE TABLET BY MOUTH DAILY  . [DISCONTINUED] diltiazem (CARDIZEM CD) 120 MG 24 hr capsule Take 1 capsule (120 mg total) by mouth every morning.  . [DISCONTINUED] EFFIENT 10 MG TABS tablet TAKE ONE TABLET BY MOUTH EVERY EVENING    05/2016; OM1 PCI: SYNERGY DES 2.5X16 -overlaps OLD stent.  Cath February 13, 2018 similar findings when compared to prior cath.    Allergies  Allergen Reactions  . Niacin And Related Hives and Itching    SOCIAL HISTORY/FAMILY HISTORY   Reviewed in Epic:  Pertinent findings: He now has his last 2 children-sons both in college, one at [employer], another at [employer] (full scholarship - plans to do Special educational needs teacher) -- Lonzo is very excited about this.  OBJCTIVE -PE, EKG, labs   Wt Readings from Last 3 Encounters:  03/28/19 191 lb (86.6 kg)  08/18/18 186 lb (84.4 kg)  08/17/18 187 lb (84.8 kg)    Physical Exam: BP 122/90 (BP Location: Left Arm, Patient Position: Sitting, Cuff Size: Normal)   Pulse 90   Temp (!) 96.8 F (36 C)   Ht 6' (1.829 m)   Wt 191 lb (86.6 kg)   BMI 25.90 kg/m  Physical Exam  Constitutional: He is oriented to person, place, and time. He appears well-developed and well-nourished. No distress.  Relatively healthy-appearing.  Well-groomed.  HENT:  Head: Normocephalic and atraumatic.  Neck: No JVD present. Carotid bruit is present (Soft bilateral carotid bruits.).  Cardiovascular: Normal rate, regular rhythm, normal heart sounds and normal pulses.  No extrasystoles are present. PMI is not displaced. Exam reveals no gallop and no friction rub.  No murmur heard. Pulmonary/Chest: Effort normal. No respiratory distress. He has no wheezes.  Mild interstitial sounds, but no rales or rhonchi.  Abdominal: Soft. Bowel sounds are normal. He exhibits no distension. There is no  abdominal tenderness.  Musculoskeletal:        General: No edema. Normal range of motion.     Cervical back: Normal range of motion and neck supple.  Neurological: He is alert and oriented to person, place, and time.  Psychiatric: He has a normal mood and affect. His behavior is normal. Judgment and thought content normal.  Vitals reviewed.   Adult ECG Report  Rate: 90 ;  Rhythm: normal sinus rhythm and Normal axis, intervals and durations.;   Narrative Interpretation: Normal EKG  Recent Labs:    Lab Results  Component Value Date   CHOL 129 07/05/2018   HDL 30.90 (L) 07/05/2018   LDLCALC 43 01/12/2018   LDLDIRECT 53.0 07/05/2018   TRIG 346.0 (H) 07/05/2018   CHOLHDL 4 07/05/2018   Lab Results  Component Value Date   CREATININE 0.80 08/24/2018   BUN 13 07/05/2018   NA 140 07/05/2018   K 4.5 07/05/2018   CL 106 07/05/2018   CO2 26 07/05/2018   Lab Results  Component Value Date   TSH 2.330 10/12/2016    ASSESSMENT/PLAN    Problem List Items Addressed This Visit    STEMI, acute inf. wall with 100% RCA stenosis, DES 07/07/11 (prior Anterior STEMI in 2001) (Chronic)    He has had ST elevation MIs involving the RCA, LAD and LCx-OM.  Has stents in all major vessels.  Has also had multiple episodes of unstable angina and non-ST elevation MIs. Relatively asymptomatic now over the last 2 years.  Stable calf in January 2020 with last PCI in April 2018.  He has made it beyond  the 2-year point since his most recent intervention which usually brings on high-level anxiety and PTSD related chest pain issues.  Now that he is 3 years out from his last PCI, he is feeling well.      Relevant Medications   diltiazem (CARDIZEM CD) 120 MG 24 hr capsule   Cardiomyopathy, ischemic; EF~45% with hypokinesis of the inferior and inferolateral myocardium; CO 3.25 - Primary (Chronic)    Doing well.  No active heart failure symptoms.  Euvolemic on exam..  Unable to tolerate ARB because of  hypotension.  Now currently on low-dose Toprol plus diltiazem and Imdur. No diuretic requirement. On high-dose rosuvastatin with no myalgias.      Relevant Medications   diltiazem (CARDIZEM CD) 120 MG 24 hr capsule   Other Relevant Orders   EKG 12-Lead (Completed)   Coronary artery disease involving native coronary artery of native heart with angina pectoris (HCC) (Chronic)    Multivessel PCI over many years.  Most recent PCI in April 2018 with patent stents noted in January 2020.  Likely also has microvascular disease.  We are monitoring moderate proximal RCA disease which is seen relatively stable.  Aggressive secondary prevention: We will continue lifelong antiplatelet agent with Effient.  Continue low-dose beta-blocker and rosuvastatin. Is on diltiazem as opposed to amlodipine for additional rate control and antianginal benefit with likely microvascular disease.  This is in addition to moderate dose Imdur.  Continue current medications.  Okay to hold Effient 7-9 days prior to procedures or surgeries.      Relevant Medications   diltiazem (CARDIZEM CD) 120 MG 24 hr capsule   Other Relevant Orders   EKG 12-Lead (Completed)   Chronic combined systolic and diastolic congestive heart failure, NYHA class 2 (HCC) (Chronic)    Class I symptoms.  Most of his dyspnea is related to smoking. Euvolemic.  Not on diuretic. Really only on Imdur and low-dose Toprol.  Tolerating diltiazem.      Relevant Medications   diltiazem (CARDIZEM CD) 120 MG 24 hr capsule   Chronic stable angina (HCC) (Chronic)    Thankfully, no further angina after converting from amlodipine to diltiazem.  Combination of diltiazem, Imdur and Toprol has been feeling relatively comfortable.      Relevant Medications   diltiazem (CARDIZEM CD) 120 MG 24 hr capsule   Other Relevant Orders   EKG 12-Lead (Completed)   Encounter for smoking cessation counseling (Chronic)    This is really the main focus of our discussion  today.  We spent least 10 minutes talking about things that have worked in the past and what has not worked.  He is thought about it long hard, he would like to set a quit date and thinks that his best option would be to restart Chantix.  Provide prescription for Chantix -starter pack +3 refills.      Essential hypertension (Chronic)    Somewhat labile blood pressure.  Was not able tolerate ARB or amlodipine because of hypotension.  Is now on low-doses of both Toprol and diltiazem with well-controlled pressures.      Relevant Medications   diltiazem (CARDIZEM CD) 120 MG 24 hr capsule   Hyperlipidemia with target LDL less than 70; by his report he is back on Crestor (Chronic)    With exception of elevated triglycerides during last evaluation, lipids look great.  Currently on rosuvastatin 40 mg daily.Marland Kitchen  He is on OTC fish oil for triglycerides, would like to use Vascepa, however concerns for financial issues  have limited options.      Relevant Medications   diltiazem (CARDIZEM CD) 120 MG 24 hr capsule       COVID-19 Education: The signs and symptoms of COVID-19 were discussed with the patient and how to seek care for testing (follow up with PCP or arrange E-visit).   The importance of social distancing was discussed today.  I spent a total of 22 minutes with the patient. >  50% of the time was spent in direct patient consultation.  Additional time spent with chart review  / charting (studies, outside notes, etc): 10 Total Time: 74mn   Current medicines are reviewed at length with the patient today.  (+/- concerns) n/a   Patient Instructions / Medication Changes & Studies & Tests Ordered   Patient Instructions  Medication Instructions:   START  CHANTIX  STARTER THE STARTER PAK  FOR ONE MONTH UNTIL COMPLETE THEN GO TH THE MAINTENANCE DOSE  *If you need a refill on your cardiac medications before your next appointment, please call your pharmacy*  Lab Work: NOT NEEDED  Testing/Procedures: NOT NEEDED  Follow-Up: [facility] and your health needs are our priority.  As part of our continuing mission to provide you with exceptional heart care, we have created designated Provider Care Teams.  These Care Teams include your primary Cardiologist (physician) and Advanced Practice Providers (APPs -  Physician Assistants and Nurse Practitioners) who all work together to provide you with the care you need, when you need it.  Your next appointment:   6 month(s)  The format for your next appointment:   In Person  Provider:   DGlenetta Hew MD  Other Instructions N/A    Studies Ordered:   Orders Placed This Encounter  Procedures  . EKG 12-Lead     DGlenetta Hew M.D., M.S. Interventional Cardiologist   Pager # [phone] # [phone] [address] [place] [id number]  [facility]!

## 2019-03-28 NOTE — Patient Instructions (Signed)
Medication Instructions:   START  CHANTIX  STARTER THE STARTER PAK  FOR ONE MONTH UNTIL COMPLETE THEN GO TH THE MAINTENANCE DOSE  *If you need a refill on your cardiac medications before your next appointment, please call your pharmacy*  Lab Work: NOT NEEDED Testing/Procedures: NOT NEEDED  Follow-Up: At [facility], you and your health needs are our priority.  As part of our continuing mission to provide you with exceptional heart care, we have created designated Provider Care Teams.  These Care Teams include your primary Cardiologist (physician) and Advanced Practice Providers (APPs -  Physician Assistants and Nurse Practitioners) who all work together to provide you with the care you need, when you need it.  Your next appointment:   6 month(s)  The format for your next appointment:   In Person  Provider:   Glenetta Hew, MD  Other Instructions N/A

## 2019-03-31 ENCOUNTER — Encounter: Payer: Self-pay | Admitting: Cardiology

## 2019-03-31 DIAGNOSIS — Z716 Tobacco abuse counseling: Secondary | ICD-10-CM | POA: Insufficient documentation

## 2019-03-31 NOTE — Assessment & Plan Note (Signed)
Doing well.  No active heart failure symptoms.  Euvolemic on exam..  Unable to tolerate ARB because of hypotension.  Now currently on low-dose Toprol plus diltiazem and Imdur. No diuretic requirement. On high-dose rosuvastatin with no myalgias.

## 2019-03-31 NOTE — Assessment & Plan Note (Signed)
He has had ST elevation MIs involving the RCA, LAD and LCx-OM.  Has stents in all major vessels.  Has also had multiple episodes of unstable angina and non-ST elevation MIs. Relatively asymptomatic now over the last 2 years.  Stable calf in January 2020 with last PCI in April 2018.  He has made it beyond the 2-year point since his most recent intervention which usually brings on high-level anxiety and PTSD related chest pain issues.  Now that he is 3 years out from his last PCI, he is feeling well.

## 2019-03-31 NOTE — Assessment & Plan Note (Signed)
Somewhat labile blood pressure.  Was not able tolerate ARB or amlodipine because of hypotension.  Is now on low-doses of both Toprol and diltiazem with well-controlled pressures.

## 2019-03-31 NOTE — Assessment & Plan Note (Signed)
With exception of elevated triglycerides during last evaluation, lipids look great.  Currently on rosuvastatin 40 mg daily.Marland Kitchen  He is on OTC fish oil for triglycerides, would like to use Vascepa, however concerns for financial issues have limited options.

## 2019-03-31 NOTE — Assessment & Plan Note (Signed)
Class I symptoms.  Most of his dyspnea is related to smoking. Euvolemic.  Not on diuretic. Really only on Imdur and low-dose Toprol.  Tolerating diltiazem.

## 2019-03-31 NOTE — Assessment & Plan Note (Signed)
Multivessel PCI over many years.  Most recent PCI in April 2018 with patent stents noted in January 2020.  Likely also has microvascular disease.  We are monitoring moderate proximal RCA disease which is seen relatively stable.  Aggressive secondary prevention: We will continue lifelong antiplatelet agent with Effient.  Continue low-dose beta-blocker and rosuvastatin. Is on diltiazem as opposed to amlodipine for additional rate control and antianginal benefit with likely microvascular disease.  This is in addition to moderate dose Imdur.  Continue current medications.  Okay to hold Effient 7-9 days prior to procedures or surgeries.

## 2019-03-31 NOTE — Assessment & Plan Note (Signed)
Thankfully, no further angina after converting from amlodipine to diltiazem.  Combination of diltiazem, Imdur and Toprol has been feeling relatively comfortable.

## 2019-03-31 NOTE — Assessment & Plan Note (Addendum)
This is really the main focus of our discussion today.  We spent least 10 minutes talking about things that have worked in the past and what has not worked.  He is thought about it long hard, he would like to set a quit date and thinks that his best option would be to restart Chantix.  Provide prescription for Chantix -starter pack +3 refills.

## 2019-04-01 ENCOUNTER — Other Ambulatory Visit: Payer: Self-pay | Admitting: Cardiology

## 2019-04-07 ENCOUNTER — Other Ambulatory Visit: Payer: Self-pay | Admitting: Cardiology

## 2019-05-01 ENCOUNTER — Other Ambulatory Visit: Payer: Self-pay | Admitting: Internal Medicine

## 2019-05-01 DIAGNOSIS — I5042 Chronic combined systolic (congestive) and diastolic (congestive) heart failure: Secondary | ICD-10-CM

## 2019-06-03 ENCOUNTER — Other Ambulatory Visit: Payer: Self-pay | Admitting: Internal Medicine

## 2019-06-03 DIAGNOSIS — I5042 Chronic combined systolic (congestive) and diastolic (congestive) heart failure: Secondary | ICD-10-CM

## 2019-07-04 ENCOUNTER — Other Ambulatory Visit: Payer: Self-pay | Admitting: Internal Medicine

## 2019-07-04 DIAGNOSIS — I5042 Chronic combined systolic (congestive) and diastolic (congestive) heart failure: Secondary | ICD-10-CM

## 2019-07-17 ENCOUNTER — Other Ambulatory Visit: Payer: Self-pay | Admitting: Cardiology

## 2019-07-18 ENCOUNTER — Other Ambulatory Visit: Payer: Self-pay | Admitting: Cardiology

## 2019-07-18 MED ORDER — METOPROLOL SUCCINATE ER 25 MG PO TB24: 37.5000 mg | ORAL_TABLET | Freq: Every evening | ORAL | 2 refills | Status: DC

## 2019-07-18 NOTE — Telephone Encounter (Signed)
*  STAT* If patient is at the pharmacy, call can be transferred to refill team.   1. Which medications need to be refilled? (please list name of each medication and dose if known) metoprolol succinate (TOPROL XL) 25 MG 24 hr tablet  2. Which pharmacy/location (including street and city if local pharmacy) is medication to be sent to? [place], [place]  3. Do they need a 30 day or 90 day supply? 90 day  Patient has been out of the medication for 2 days

## 2019-08-06 ENCOUNTER — Other Ambulatory Visit: Payer: Self-pay | Admitting: Internal Medicine

## 2019-08-06 DIAGNOSIS — I5042 Chronic combined systolic (congestive) and diastolic (congestive) heart failure: Secondary | ICD-10-CM

## 2019-08-08 ENCOUNTER — Other Ambulatory Visit: Payer: Self-pay | Admitting: Cardiology

## 2019-08-08 MED ORDER — VARENICLINE TARTRATE 1 MG PO TABS: 1.0000 mg | ORAL_TABLET | Freq: Two times a day (BID) | ORAL | 2 refills | Status: DC

## 2019-08-08 NOTE — Telephone Encounter (Signed)
Rx(s) sent to pharmacy electronically.  

## 2019-08-08 NOTE — Telephone Encounter (Signed)
  *  STAT* If patient is at the pharmacy, call can be transferred to refill team.   1. Which medications need to be refilled? (please list name of each medication and dose if known)   varenicline (CHANTIX CONTINUING MONTH PAK) 1 MG tablet    2. Which pharmacy/location (including street and city if local pharmacy) is medication to be sent to?[place], [place]  3. Do they need a 30 day or 90 day supply? 90 days   Pt said he started taking it a month ago and it work tremendously and would like to continue taking it. Pt took the last dose today

## 2019-09-03 ENCOUNTER — Other Ambulatory Visit: Payer: Self-pay | Admitting: Internal Medicine

## 2019-09-03 DIAGNOSIS — I5042 Chronic combined systolic (congestive) and diastolic (congestive) heart failure: Secondary | ICD-10-CM

## 2019-09-14 DIAGNOSIS — Z1152 Encounter for screening for COVID-19: Secondary | ICD-10-CM | POA: Diagnosis not present

## 2019-09-14 DIAGNOSIS — Z03818 Encounter for observation for suspected exposure to other biological agents ruled out: Secondary | ICD-10-CM | POA: Diagnosis not present

## 2019-09-14 DIAGNOSIS — R05 Cough: Secondary | ICD-10-CM | POA: Diagnosis not present

## 2019-09-14 DIAGNOSIS — R519 Headache, unspecified: Secondary | ICD-10-CM | POA: Diagnosis not present

## 2019-09-17 ENCOUNTER — Telehealth: Payer: Self-pay | Admitting: *Deleted

## 2019-09-17 NOTE — Telephone Encounter (Signed)
Patient's wife left a voicemail stating that he has a large skin tag on his thigh. Patient's wife stated that it has started to bleeding and wants to know if this can be removed in the office.

## 2019-09-18 ENCOUNTER — Telehealth: Payer: Self-pay | Admitting: *Deleted

## 2019-09-18 NOTE — Telephone Encounter (Signed)
Pt has apt on 8/17 which is 30 min apt

## 2019-09-18 NOTE — Telephone Encounter (Signed)
A message was left, re: his follow up visit. 

## 2019-09-25 ENCOUNTER — Ambulatory Visit (INDEPENDENT_AMBULATORY_CARE_PROVIDER_SITE_OTHER): Payer: PPO | Admitting: Internal Medicine

## 2019-09-25 ENCOUNTER — Encounter: Payer: Self-pay | Admitting: Internal Medicine

## 2019-09-25 ENCOUNTER — Other Ambulatory Visit: Payer: Self-pay | Admitting: Internal Medicine

## 2019-09-25 ENCOUNTER — Other Ambulatory Visit: Payer: Self-pay

## 2019-09-25 ENCOUNTER — Telehealth: Payer: Self-pay | Admitting: Cardiology

## 2019-09-25 VITALS — BP 118/74 | HR 80 | Temp 97.7°F | Ht 70.75 in | Wt 186.5 lb

## 2019-09-25 DIAGNOSIS — D1723 Benign lipomatous neoplasm of skin and subcutaneous tissue of right leg: Secondary | ICD-10-CM

## 2019-09-25 DIAGNOSIS — L918 Other hypertrophic disorders of the skin: Secondary | ICD-10-CM

## 2019-09-25 NOTE — Progress Notes (Signed)
Subjective:    Patient ID: Troy Floro., male    DOB: 1960-08-23, 59 y.o.   MRN: [id number]  HPI  Patient presents the clinic today with complaint of a skin tag that is changing colors. This has been there for years but noticed the discoloration 2 weeks ago. He thinks a "piece of his hair cut part of the skin tag in his sleep". He has not noticed any pain or discharge from the area. He has not tried anything OTC for this.  Review of Systems      Past Medical History:  Diagnosis Date  . Bronchitis    Gets bronchitis almost every year  . CAD S/P percutaneous coronary angioplasty 06/2011; 6/ & 11/2013   S/P PCI to all 3 major vessels;a)  Ant STEMI 2001- BMS-LAD x2 -->(redo PCI 6/'15 -- pLAD Xience DES 2.5 x 12- 2.75 mm, mLAD 2.25 x 12 - 2.7 mm), b) '06 UA --> Cx- OM DES; c) 2013 Inf MI BMS mRCA --> d) 10/'15 PCI dRCA Promus P DES 4.0 x 16 (4.25 mm); PTCA of RPL2 (2.0 mm) &RPDA (2.25 mm) 11/2013; 4/18 PCI OM1 Synergy DES  2.5 x 16  . COPD (chronic obstructive pulmonary disease) ([place])   . Elevated WBC count   . Emphysema of lung ([place])   . Essential hypertension 07/07/2011  . Former heavy tobacco smoker     quit in May 2015 after multiple attempts at trying to quit before   . GERD (gastroesophageal reflux disease)   . Glucose intolerance (pre-diabetes) June 2015    hemoglobin A1c 6.6  . Headache    "Imdur related; stopped taking it; headaches went away" (11/15/2013)  . Heart murmur   . History of colon polyps   . History of stomach ulcers   . Hyperlipidemia with target LDL less than 70 07/07/2011  . Metabolic syndrome    Pre-diabetes, hypertension and truncal obesity as well as dyslipidemia  . OSA on CPAP   . Pneumonia "several times"  . Recurrent upper respiratory infection (URI)   . Seizures ([place]) "several"   "last one was ~ 2011" (11/15/2013)  . Shortness of breath dyspnea   . ST elevation myocardial infarction (STEMI) of anterior wall ([place]) 2001   History of -- 2 stents in  early and distal mid LAD; prior cardiologist was Dr. Remi Haggard in [place]  . ST elevation myocardial infarction (STEMI) of inferior wall ([place]) 07/07/2011   Occluded RCA 7.37T06 INTEGRITY; Echo 07/2013: EF 45-50%, Inferior & Posterolateral HK.   Marland Kitchen Umbilical hernia   . Unstable angina ([place]) 2006; 07/2013   Cx-OM - PCI 2.5 mm 13 mm Cypher DES [name], [place]) ; 07/2013: Severe ISR of both prox & Distal LAD stentS --> 2 DES stents (1 at prox edge of the proximal stent, 2nd covers the entire distal stent as well as proximal and distal edge stenose)   . Upper respiratory infection     Current Outpatient Medications  Medication Sig Dispense Refill  . albuterol (VENTOLIN HFA) 108 (90 Base) MCG/ACT inhaler INHALE TWO PUFFS INTO THE LUNGS EVERY 6 HOURS AS NEEDED FOR SHORTNESS OF BREATH 8.5 g 1  . clobetasol cream (TEMOVATE) 0.05 % APPLY TOPICALLY TO AFFECTED AREA(S) DAILY AS NEEDED FOR ECZEMA (Patient taking differently: Apply 1 application topically daily as needed (eczema). ) 30 g 4  . diltiazem (CARDIZEM CD) 120 MG 24 hr capsule Take 1 capsule (120 mg total) by mouth every morning. 90 capsule 3  . EFFIENT  10 MG TABS tablet Take 1 tablet (10 mg total) by mouth every evening. 90 tablet 3  . fenofibrate (TRICOR) 48 MG tablet TAKE ONE TABLET BY MOUTH DAILY 90 tablet 3  . fluticasone (FLONASE) 50 MCG/ACT nasal spray Place 2 sprays into both nostrils daily. (Patient taking differently: Place 2 sprays into both nostrils daily as needed (congestion). ) 16 g 0  . Fluticasone-Salmeterol (ADVAIR) 100-50 MCG/DOSE AEPB Inhale 1 puff into the lungs in the morning and at bedtime. LAST REFILL SCHEDULE PHYSICAL 60 each 0  . isosorbide mononitrate (IMDUR) 60 MG 24 hr tablet Take 1 tablet (60 mg total) by mouth daily. TAKE 30 MINUTES AFTER DAILY ASPIRIN 30 tablet 6  . Melatonin 1 MG CAPS Take 1 mg by mouth at bedtime as needed (sleep).    . metoprolol succinate (TOPROL XL) 25 MG 24 hr tablet Take 1.5 tablets (37.5 mg  total) by mouth every evening. 135 tablet 2  . nitroGLYCERIN (NITROSTAT) 0.4 MG SL tablet Place 1 tablet (0.4 mg total) under the tongue every 5 (five) minutes as needed for chest pain. 25 tablet 3  . Omega-3 Fatty Acids (FISH OIL) 1000 MG CPDR Take 3 g by mouth daily. 30 capsule 11  . omeprazole (PRILOSEC) 20 MG capsule TAKE ONE CAPSULE BY MOUTH DAILY 90 capsule 3  . rosuvastatin (CRESTOR) 40 MG tablet TAKE ONE TABLET BY MOUTH DAILY 90 tablet 3  . varenicline (CHANTIX CONTINUING MONTH PAK) 1 MG tablet Take 1 tablet (1 mg total) by mouth 2 (two) times daily. 60 tablet 2  . varenicline (CHANTIX STARTING MONTH PAK) 0.5 MG X 11 & 1 MG X 42 tablet Take one 0.5 mg tablet by mouth once daily for 3 days, then increase to one 0.5 mg tablet twice daily for 4 days, then increase to one 1 mg tablet twice daily. 53 tablet 0   No current facility-administered medications for this visit.    Allergies  Allergen Reactions  . Niacin And Related Hives and Itching    Family History  Problem Relation Age of Onset  . Coronary artery disease Father   . Heart disease Father   . Stroke Father   . Hypertension Father   . Hyperlipidemia Mother   . Diabetes Mother   . Hyperlipidemia Maternal Grandmother   . Diabetes Maternal Grandmother   . Hyperlipidemia Maternal Grandfather   . Diabetes Maternal Grandfather   . Emphysema Maternal Grandfather   . Heart disease Paternal Grandmother   . Heart disease Paternal Grandfather   . Cancer Maternal Aunt        breast    Social History   Socioeconomic History  . Marital status: Married    Spouse name: Not on file  . Number of children: Not on file  . Years of education: Not on file  . Highest education level: Not on file  Occupational History  . Not on file  Tobacco Use  . Smoking status: Current Every Day Smoker    Packs/day: 0.50    Years: 40.00    Pack years: 20.00    Types: Cigarettes    Last attempt to quit: 01/19/2017    Years since quitting:  2.6  . Smokeless tobacco: Never Used  . Tobacco comment: Quit with Bupropion 150mg  - > unfortunately, he restarted  Substance and Sexual Activity  . Alcohol use: Yes    Comment: rare  . Drug use: No  . Sexual activity: Not on file  Other Topics Concern  . Not  on file  Social History Narrative   He is a married father of 73, grandfather of 89. He does not really get routine exercise.    He is down to 5 or 6 cigarettes a day. He is very seriously wanting to quit. This is a significant cutback for him, but he has pretty much been told he needs to stop by his wife, and so he fully intends to do so. He is willing to try the patches or whatever. He has a social alcoholic beverage every now and then.    Social Determinants of Health   Financial Resource Strain:   . Difficulty of Paying Living Expenses:   Food Insecurity:   . Worried About Charity fundraiser in the Last Year:   . Arboriculturist in the Last Year:   Transportation Needs:   . Film/video editor (Medical):   Marland Kitchen Lack of Transportation (Non-Medical):   Physical Activity:   . Days of Exercise per Week:   . Minutes of Exercise per Session:   Stress:   . Feeling of Stress :   Social Connections:   . Frequency of Communication with Friends and Family:   . Frequency of Social Gatherings with Friends and Family:   . Attends Religious Services:   . Active Member of Clubs or Organizations:   . Attends Archivist Meetings:   Marland Kitchen Marital Status:   Intimate Partner Violence:   . Fear of Current or Ex-Partner:   . Emotionally Abused:   Marland Kitchen Physically Abused:   . Sexually Abused:      Constitutional: Denies fever, malaise, fatigue, headache or abrupt weight changes.  Respiratory: Denies difficulty breathing, shortness of breath, cough or sputum production.   Cardiovascular: Denies chest pain, chest tightness, palpitations or swelling in the hands or feet.  Skin: Pt reports skin tag. Denies redness, rashes or  ulcercations.    No other specific complaints in a complete review of systems (except as listed in HPI above).  Objective:   Physical Exam  BP 118/74 (BP Location: Left Arm, Patient Position: Sitting, Cuff Size: Normal)   Pulse 80   Temp 97.7 F (36.5 C) (Temporal)   Ht 5' 10.75" (1.797 m)   Wt 186 lb 8 oz (84.6 kg)   SpO2 94%   BMI 26.20 kg/m   Wt Readings from Last 3 Encounters:  03/28/19 191 lb (86.6 kg)  08/18/18 186 lb (84.4 kg)  08/17/18 187 lb (84.8 kg)    General: Appears his stated age, well developed, well nourished in NAD. Skin: Large skin tag noted of right medial thigh. Cardiovascular: Normal rate. Pulmonary/Chest: Normal effort and positive vesicular breath sounds. No respiratory distress. No wheezes, rales or ronchi noted.  Neurological: Alert and oriented.    BMET    Component Value Date/Time   NA 140 07/05/2018 1017   NA 140 02/09/2018 1121   NA 139 10/24/2013 1050   K 4.5 07/05/2018 1017   K 3.8 10/24/2013 1050   CL 106 07/05/2018 1017   CL 107 10/24/2013 1050   CO2 26 07/05/2018 1017   CO2 24 10/24/2013 1050   GLUCOSE 138 (H) 07/05/2018 1017   GLUCOSE 108 (H) 10/24/2013 1050   BUN 13 07/05/2018 1017   BUN 11 02/09/2018 1121   BUN 8 10/24/2013 1050   CREATININE 0.80 08/24/2018 1509   CREATININE 1.01 05/13/2016 1044   CALCIUM 9.1 07/05/2018 1017   CALCIUM 8.2 (L) 10/24/2013 1050   GFRNONAA 97 02/09/2018  [place] 10/24/2013 1050   GFRAA 112 02/09/2018 1121   GFRAA >60 10/24/2013 1050    Lipid Panel     Component Value Date/Time   CHOL 129 07/05/2018 1017   CHOL 118 01/12/2018 0915   TRIG 346.0 (H) 07/05/2018 1017   HDL 30.90 (L) 07/05/2018 1017   HDL 32 (L) 01/12/2018 0915   CHOLHDL 4 07/05/2018 1017   VLDL 69.2 (H) 07/05/2018 1017   LDLCALC 43 01/12/2018 0915    CBC    Component Value Date/Time   WBC 14.1 (H) 07/05/2018 1017   RBC 5.49 07/05/2018 1017   HGB 17.0 07/05/2018 1017   HGB 16.1 02/09/2018 1121   HCT  48.9 07/05/2018 1017   HCT 48.5 02/09/2018 1121   PLT 375.0 07/05/2018 1017   PLT 461 (H) 02/09/2018 1121   MCV 89.1 07/05/2018 1017   MCV 88 02/09/2018 1121   MCV 89 10/24/2013 1050   MCH 29.1 02/09/2018 1121   MCH 30.4 05/13/2016 1044   MCHC 34.7 07/05/2018 1017   RDW 15.0 07/05/2018 1017   RDW 13.3 02/09/2018 1121   RDW 14.5 10/24/2013 1050   LYMPHSABS 4.6 (H) 04/04/2015 1040   LYMPHSABS 2.8 11/07/2013 1509   MONOABS 0.9 04/04/2015 1040   EOSABS 0.1 04/04/2015 1040   EOSABS 0.2 11/07/2013 1509   BASOSABS 0.1 04/04/2015 1040   BASOSABS 0.1 11/07/2013 1509    Hgb A1C Lab Results  Component Value Date   HGBA1C 6.7 (H) 07/05/2018           Assessment & Plan:  Skin Tag, Right Thigh:  Discussed risk of pain, bleeding and infection, informed consent obtained Area cleansed with Betadine x 2 Area numbed with Lidocaine with Epi 1% approx 1 mL Skin tag removed using shave blade Area cauterized with Silver Nitrate Are covered with TAB, pressure dressing Skin tag sent for pathology Pt tolerated well, no complication  Return precautions discussed Webb Silversmith, NP This visit occurred during the SARS-CoV-2 public health emergency.  Safety protocols were in place, including screening questions prior to the visit, additional usage of staff PPE, and extensive cleaning of exam room while observing appropriate contact time as indicated for disinfecting solutions.

## 2019-09-25 NOTE — Patient Instructions (Signed)
Skin Tag, Adult  A skin tag (acrochordon) is a soft, extra growth of skin. Most skin tags are flesh-colored and rarely bigger than a pencil eraser. They commonly form near areas where there are folds in the skin, such as the armpit or groin. Skin tags are not dangerous, and they do not spread from person to person (are not contagious). You may have one skin tag or several. Skin tags do not require treatment. However, your health care provider may recommend removal of a skin tag if it:  Gets irritated from clothing.  Bleeds.  Is visible and unsightly. Your health care provider can remove skin tags with a simple surgical procedure or a procedure that involves freezing the skin tag. Follow these instructions at home:  Watch for any changes in your skin tag. A normal skin tag does not require any other special care at home.  Take over-the-counter and prescription medicines only as told by your health care provider.  Keep all follow-up visits as told by your health care provider. This is important. Contact a health care provider if:  You have a skin tag that: ? Becomes painful. ? Changes color. ? Bleeds. ? Swells.  You develop more skin tags. This information is not intended to replace advice given to you by your health care provider. Make sure you discuss any questions you have with your health care provider. Document Revised: 01/07/2017 Document Reviewed: 02/09/2015 Elsevier Patient Education  2020 Elsevier Inc.  

## 2019-09-25 NOTE — Telephone Encounter (Signed)
LVM for patient to return call to get follow up scheduled with [facility] from recall list 

## 2019-09-25 NOTE — Addendum Note (Signed)
Addended by: Lurlean Nanny on: 09/25/2019 02:44 PM   Modules accepted: Orders

## 2019-10-09 ENCOUNTER — Other Ambulatory Visit: Payer: Self-pay | Admitting: Internal Medicine

## 2019-10-09 DIAGNOSIS — I5042 Chronic combined systolic (congestive) and diastolic (congestive) heart failure: Secondary | ICD-10-CM

## 2019-11-05 ENCOUNTER — Ambulatory Visit: Payer: PPO | Admitting: Cardiology

## 2019-11-05 ENCOUNTER — Encounter: Payer: Self-pay | Admitting: Cardiology

## 2019-11-05 ENCOUNTER — Other Ambulatory Visit: Payer: Self-pay

## 2019-11-05 VITALS — BP 128/88 | HR 89 | Temp 96.2°F | Ht 72.0 in | Wt 185.0 lb

## 2019-11-05 DIAGNOSIS — I10 Essential (primary) hypertension: Secondary | ICD-10-CM

## 2019-11-05 DIAGNOSIS — Z955 Presence of coronary angioplasty implant and graft: Secondary | ICD-10-CM

## 2019-11-05 DIAGNOSIS — Z716 Tobacco abuse counseling: Secondary | ICD-10-CM | POA: Diagnosis not present

## 2019-11-05 DIAGNOSIS — I25119 Atherosclerotic heart disease of native coronary artery with unspecified angina pectoris: Secondary | ICD-10-CM | POA: Diagnosis not present

## 2019-11-05 DIAGNOSIS — I255 Ischemic cardiomyopathy: Secondary | ICD-10-CM | POA: Diagnosis not present

## 2019-11-05 DIAGNOSIS — R7303 Prediabetes: Secondary | ICD-10-CM

## 2019-11-05 DIAGNOSIS — I2111 ST elevation (STEMI) myocardial infarction involving right coronary artery: Secondary | ICD-10-CM | POA: Diagnosis not present

## 2019-11-05 DIAGNOSIS — I208 Other forms of angina pectoris: Secondary | ICD-10-CM | POA: Diagnosis not present

## 2019-11-05 DIAGNOSIS — E785 Hyperlipidemia, unspecified: Secondary | ICD-10-CM

## 2019-11-05 DIAGNOSIS — E781 Pure hyperglyceridemia: Secondary | ICD-10-CM | POA: Diagnosis not present

## 2019-11-05 MED ORDER — VARENICLINE TARTRATE 1 MG PO TABS: 1.0000 mg | ORAL_TABLET | Freq: Two times a day (BID) | ORAL | 2 refills | Status: DC

## 2019-11-05 NOTE — Patient Instructions (Addendum)
Medication Instructions:  Restart Chantix  *If you need a refill on your cardiac medications before your next appointment, please call your pharmacy*   Lab Work: CMP, Lipids, CBC If you have labs (blood work) drawn today and your tests are completely normal, you will receive your results only by: Marland Kitchen MyChart Message (if you have MyChart) OR . A paper copy in the mail If you have any lab test that is abnormal or we need to change your treatment, we will call you to review the results.   Testing/Procedures: None   Follow-Up: At [facility], you and your health needs are our priority.  As part of our continuing mission to provide you with exceptional heart care, we have created designated Provider Care Teams.  These Care Teams include your primary Cardiologist (physician) and Advanced Practice Providers (APPs -  Physician Assistants and Nurse Practitioners) who all work together to provide you with the care you need, when you need it.  We recommend signing up for the patient portal called "MyChart".  Sign up information is provided on this After Visit Summary.  MyChart is used to connect with patients for Virtual Visits (Telemedicine).  Patients are able to view lab/test results, encounter notes, upcoming appointments, etc.  Non-urgent messages can be sent to your provider as well.   To learn more about what you can do with MyChart, go to [email or web address].    Your next appointment:   6 month(s)  The format for your next appointment:   In Person  Provider:   Glenetta Hew, MD   Other Instructions None

## 2019-11-05 NOTE — Progress Notes (Signed)
Primary Care Provider: Jearld Fenton, NP Cardiologist: Glenetta Hew, MD Electrophysiologist: None  Clinic Note: Chief Complaint  Patient presents with  . Follow-up    95-month  . Coronary Artery Disease    Multivessel.  . Nicotine Dependence    Ready to quit.  Was almost there were Chantix before.    HPI:    Troy Parrish. is a 59 y.o. male with a detailed cardiac history (multiple ST elevation MIs and unstable angina presentations with ischemic cardiomyopathy (essentially all major arteries have PCI) described below who presents today for 51-month follow-up.  He has a very significant multivessel coronary disease history with at least 3 STEMI's and at least 3 if not 4 episodes of progressive /unstable angina leading to additional PCI (now all 3 major arteries s/p PCI). He also has very difficult/poorly controlled lipids. He intermittently goes back to smoking, but is now in the process of trying to complete his quitting.  Has had blood pressure issues with adding ARB or amlodipine, therefore we changed to diltiazem --> doing much better since converting from amlodipine to diltiazem.  Less fatigue being off of the high-dose beta-blocker.  Unable to afford Ranexa in the past   Most recent cardiac catheterization was in January 2020 (reviewed below) showing stable coronary disease with patent stents.  Only mild progression of disease in the very proximal LAD but not physiologically significant.   July 2020.  He noted that he felt much better after he went from amlodipine to diltiazem.  He had less fatigue than he had had with the higher dose beta-blocker, and the lack of hypotension and dizziness was also better.  His rapid heart rate spell was essentially cleared up and he had much less of the exertional chest pain.  Biggest issue is working on smoking cessation.  Troy Parrish. was last seen on on March 28, 2019.  Been doing very well.  Symptomatically notably improved.   Stable with no angina or heart failure.  Excited about his son going to college.  In great spirits.  Was working on smoking cessation and we actually prescribe Chantix.  Major symptoms he had related to seasonal allergies with congestion and cough.  Recent Hospitalizations: None  Reviewed  CV studies:    The following studies were reviewed today: (if available, images/films reviewed: From Epic Chart or Care Everywhere) . None:   Interval History:   Troy Parrish. returns here today still in great spirits.  Doing well.  Feeling well.  Blood pressures have been very stable.  He feels healthy.  The only thing that he is upset about is that he had an issue with a change in insurance and therefore has not been able to get his Chantix.  He was at the final stage of being ready to quit when Chantix was no longer covered, and he is now back to about 1/4 pack a day.  Otherwise, he clearly wants to quit.  Is very excited because this month is the 25th anniversary of his wedding.  Instead of doing one big trip he is planning multiple different trips with his wife.  They have done several weekend trips including a trip to the [facility] in August and September they have been to a major concerts.  Evaluated go off about 2weeks on another trip soon.  He is active walking no angina or heart failure.  He is eating well and healthy.  He is due for labs for  his PCP soon.  Thankfully, his allergies have cleared up.  CV Review of Symptoms (Summary): positive for - off & on palpitations, some DOE related to smoking  negative for - chest pain, edema, irregular heartbeat, orthopnea, paroxysmal nocturnal dyspnea, rapid heart rate, shortness of breath or syncope/ near syncope; TIA/ amaurosis fugax; claudication  The patient does not have symptoms concerning for COVID-19 infection (fever, chills, cough, or new shortness of breath).  The patient is practicing social distancing & Masking.  --> Him to go get his  COVID-19 vaccines once his wife was eligible.  REVIEWED OF SYSTEMS   Review of Systems  Constitutional: Negative for chills, fever and malaise/fatigue.  HENT: Positive for congestion (Associated allergies.). Negative for nosebleeds.   Respiratory: Positive for cough (Daily cough-smoker's cough). Negative for sputum production and wheezing (No active wheezing.).   Cardiovascular: Negative for claudication.  Gastrointestinal: Positive for heartburn. Negative for blood in stool and melena.  Genitourinary: Negative for frequency and hematuria.  Musculoskeletal: Negative for joint pain.  Neurological: Negative for dizziness, focal weakness and headaches.  Endo/Heme/Allergies: Positive for environmental allergies (Not as bad this year).  Psychiatric/Behavioral: Negative for depression (Much less symptoms now) and memory loss. The patient does not have insomnia.    I have reviewed and (if needed) personally updated the patient's problem list, medications, allergies, past medical and surgical history, social and family history.   PAST MEDICAL HISTORY   Past Medical History:  Diagnosis Date  . Bronchitis    Gets bronchitis almost every year  . CAD S/P percutaneous coronary angioplasty 06/2011; 6/ & 11/2013   S/P PCI to all 3 major vessels;a)  Ant STEMI 2001- BMS-LAD x2 -->(redo PCI 6/'15 -- pLAD Xience DES 2.5 x 12- 2.75 mm, mLAD 2.25 x 12 - 2.7 mm), b) '06 UA --> Cx- OM DES; c) 2013 Inf MI BMS mRCA --> d) 10/'15 PCI dRCA Promus P DES 4.0 x 16 (4.25 mm); PTCA of RPL2 (2.0 mm) &RPDA (2.25 mm) 11/2013; 4/18 PCI OM1 Synergy DES  2.5 x 16  . COPD (chronic obstructive pulmonary disease) ([place])   . Elevated WBC count   . Emphysema of lung ([place])   . Essential hypertension 07/07/2011  . Former heavy tobacco smoker     quit in May 2015 after multiple attempts at trying to quit before   . GERD (gastroesophageal reflux disease)   . Glucose intolerance (pre-diabetes) June 2015    hemoglobin A1c 6.6  .  Headache    "Imdur related; stopped taking it; headaches went away" (11/15/2013)  . Heart murmur   . History of colon polyps   . History of stomach ulcers   . Hyperlipidemia with target LDL less than 70 07/07/2011  . Metabolic syndrome    Pre-diabetes, hypertension and truncal obesity as well as dyslipidemia  . OSA on CPAP   . Pneumonia "several times"  . Recurrent upper respiratory infection (URI)   . Seizures ([place]) "several"   "last one was ~ 2011" (11/15/2013)  . Shortness of breath dyspnea   . ST elevation myocardial infarction (STEMI) of anterior wall ([place]) 2001   History of -- 2 stents in early and distal mid LAD; prior cardiologist was Dr. Remi Haggard in [place]  . ST elevation myocardial infarction (STEMI) of inferior wall ([place]) 07/07/2011   Occluded RCA 8.85O27 INTEGRITY; Echo 07/2013: EF 45-50%, Inferior & Posterolateral HK.   Marland Kitchen Umbilical hernia   . Unstable angina ([place]) 2006; 07/2013   Cx-OM - PCI 2.5 mm  13 mm Cypher DES [name], [place]) ; 07/2013: Severe ISR of both prox & Distal LAD stentS --> 2 DES stents (1 at prox edge of the proximal stent, 2nd covers the entire distal stent as well as proximal and distal edge stenose)   . Upper respiratory infection     PAST SURGICAL HISTORY   Past Surgical History:  Procedure Laterality Date  . Cardiac MRI [facility]  09/2014   EF 51%. Mod HK of basal-mid Inf wall, mild HK of basal inferoseptum & basal-mid inferolateral wall with hyper enhancement (c/w subendocard RCA MI with significant viability), focal hyper enhancement of apical wall c/w dLAD subendocard MI & complete LAD viability. . No aortic stenosis. Normal RV function. No Ischemia on Adenosine Stress.  . COLONOSCOPY WITH PROPOFOL N/A 03/28/2015   Procedure: COLONOSCOPY WITH PROPOFOL;  Surgeon: Lucilla Lame, MD;  Location: [facility];  Service: Endoscopy;  Laterality: N/A;  . CORONARY ANGIOPLASTY WITH STENT PLACEMENT  2001   ANTERIOR mi with a  PCI to LAD; [place], [place] -  Dr. Dorris Fetch  . CORONARY ANGIOPLASTY WITH STENT PLACEMENT  2006   [place] by Dr Dorris Fetch -lesion lft circ/OM1 with 2.5 x 25mm Cypher DES  . CORONARY STENT INTERVENTION N/A 05/19/2016   Procedure: Coronary Stent Intervention;  Surgeon: Leonie Man, MD;  Location: [facility] CV LAB;  Service: Cardiovascular: PCI pOM1 - Synergy DES 2.5 x 16 (overlaps old stent)  . ESOPHAGOGASTRODUODENOSCOPY (EGD) WITH PROPOFOL N/A 03/28/2015   Procedure: ESOPHAGOGASTRODUODENOSCOPY (EGD) WITH PROPOFOL;  Surgeon: Lucilla Lame, MD;  Location: [facility];  Service: Endoscopy;  Laterality: N/A;  . INSERTION OF MESH N/A 04/02/2015   Procedure: INSERTION OF MESH;  Surgeon: Florene Glen, MD;  Location: [facility];  Service: General;  Laterality: N/A;  . LEFT AND RIGHT HEART CATHETERIZATION WITH CORONARY ANGIOGRAM N/A 07/30/2013   Procedure: LEFT AND RIGHT HEART CATHETERIZATION WITH CORONARY ANGIOGRAM;  Surgeon: Leonie Man, MD;  Location: [facility] CATH LAB;  Service: Cardiovascular;  2 separate P & mLAD lesions -> PCI, MOd-Severe dRCA disesase with diffuse RPL/PDA disease (FFR)  . LEFT HEART CATH  08/03/2013   Procedure: LEFT HEART CATH;  Surgeon: Leonie Man, MD;  Location: [facility] CATH LAB;  Service: Cardiovascular;;FFR of RCA - non-flow-limiting  . LEFT HEART CATH AND CORONARY ANGIOGRAPHY N/A 05/19/2016   Procedure: Left Heart Cath and Coronary Angiography;  Surgeon: Leonie Man, MD;  Location: [place] CV LAB;  Service: Cardiovascular: CULPRIT - 85% pOM1 pre-stent. Stents in both proximal and distal RCA widely patent. PTCA sites in RPL & PDA widely patent with stable 70% small RPL 2. Extensive stenting in the LAD widely patent stents with mild to mod Dz btw prox & mid-distal stents.   Marland Kitchen LEFT HEART CATH AND CORONARY ANGIOGRAPHY N/A 02/13/2018   Procedure: LEFT HEART CATH AND CORONARY ANGIOGRAPHY;  Surgeon: Leonie Man, MD;  Location: [place] CV LAB;  Service: Cardiovascular: Similar findings to last cath  post PCI.  Stents are patent.  Roughly 50% proximal LAD lesion noted, may be progression from prior cath but otherwise stable disease.  Marland Kitchen LEFT HEART CATHETERIZATION WITH CORONARY ANGIOGRAM N/A 07/07/2011   Procedure: LEFT HEART CATHETERIZATION WITH CORONARY ANGIOGRAM;  Surgeon: Leonie Man, MD;  Location: [facility] CATH LAB;  Service: Cardiovascular;  inferolat post LV infarct ST-elevation MI  . MET/CPET  11/09/2011   submax. effort 1.05 RER peak V02 was 51% ,chronotropic incomp.hrt rate lows 80s to 100  . MET/CPET  September 2016   [facility]: Mild functional impairment due primarily to mild pulmonary and circulatory limitations. Also suggest physical deconditioning (seen with low-normal aerobic reserve). Mild VQ mismatch with exercise.  Ventilatory reserve exhausted with peak exercise demonstrated pulmonary limitation. No significant decrease in postexercise FEV1 compared to rest --> suggest no exercise induced bronchospasm  . NM MYOVIEW ([place] HX)  11/30/2011   EF 45% ,exercise 10 METS, infarct\scar w mild perinfarct ischemia -basal inferolat and mid inferolat region  . NM MYOVIEW LTD  04/25/2016   Exercised for 9 minutes. Reached 90% max. Heart rate. 9.4 METS. Read as a low risk study with normal EF 55-65%. -> on Review -  there does appear to be a small to moderate sized, medium severtiy reversible perfusion defect in the anterior wall - read by computer, but not noted by reader.  Marland Kitchen PERCUTANEOUS CORONARY STENT INTERVENTION (PCI-S)  07/07/2011   Procedure: PERCUTANEOUS CORONARY STENT INTERVENTION (PCI-S);  Surgeon: Leonie Man, MD;  Location: [facility] CATH LAB;  Service: Cardiovascular;;occluded RCA ,Integrity Resolute DES 2.75 x 18 mm stent - post-dilated 3.1 mm  . PERCUTANEOUS CORONARY STENT INTERVENTION (PCI-S)  07/30/2013   Procedure: PERCUTANEOUS CORONARY STENT INTERVENTION (PCI-S);  Surgeon: Leonie Man, MD;  Location: [facility] CATH LAB;  Service: Cardiovascular;;Distal mid LAD: Xience Alpine DES 2.25 mm x 28  mm (overlapping proximal and distal edge of previous stent) - 2.7 mm; proximal LAD 2.5 mm x 12 mm Xience Alpine DES (2.8 mm);; RCA 60-70% stenosis planned staged procedure  . PERCUTANEOUS CORONARY STENT INTERVENTION (PCI-S) N/A 11/15/2013   Procedure: PERCUTANEOUS CORONARY STENT INTERVENTION (PCI-S);  Surgeon: Leonie Man, MD;  Location: [facility] CATH LAB;  Service: Cardiovascular;  Patent Cx & LAD Stents; PCI -dRCA Promus Premier DES 4.0 mm x 16 mm (4.25 mm) dRCA, 2.0 mm Cutting PTCA of RPL2 [place] & POBA of mRPDA  . POLYPECTOMY  03/28/2015   Procedure: POLYPECTOMY;  Surgeon: Lucilla Lame, MD;  Location: [facility];  Service: Endoscopy;;  . RIGHT HEART CATH  June 2015   Normal pressures with severely reduced cardiac output and index. (3.25/1.55)  . TRANSTHORACIC ECHOCARDIOGRAM  07/31/2013; February 2017   a. LVEF 45-50. Inferior posterior hypokinesis with mild dilation. Apparent normal diastolic pressures;   . UMBILICAL HERNIA REPAIR N/A 04/02/2015   Procedure: HERNIA REPAIR UMBILICAL ADULT;  Surgeon: Florene Glen, MD;  Location: [facility] OR[facility];  Service: General;  Laterality: N/A;    MEDICATIONS/ALLERGIES   Current Meds  Medication Sig  . albuterol (VENTOLIN HFA) 108 (90 Base) MCG/ACT inhaler INHALE TWO PUFFS BY MOUTH EVERY 6 HOURS AS NEEDED FOR SHORTNESS OF BREATH  . clobetasol cream (TEMOVATE) 0.05 % APPLY TOPICALLY TO AFFECTED AREA(S) DAILY AS NEEDED FOR ECZEMA (Patient taking differently: Apply 1 application topically daily as needed (eczema). )  . diltiazem (CARDIZEM CD) 120 MG 24 hr capsule Take 1 capsule (120 mg total) by mouth every morning.  Marland Kitchen EFFIENT 10 MG TABS tablet Take 1 tablet (10 mg total) by mouth every evening.  . fenofibrate (TRICOR) 48 MG tablet TAKE ONE TABLET BY MOUTH DAILY  . Fluticasone-Salmeterol (ADVAIR) 100-50 MCG/DOSE AEPB INHALE ONE PUFF INTO THE LUNGS IN THE MORNING AND AT BEDTIME.  MUST SCHEDULE APPOINTMENT FOR MORE REFILLS  . Melatonin 1 MG CAPS Take 1 mg by  mouth at bedtime as needed (sleep).  . metoprolol succinate (TOPROL XL) 25 MG 24 hr tablet Take 1.5 tablets (37.5 mg total) by mouth every evening.  . nitroGLYCERIN (NITROSTAT) 0.4 MG  SL tablet Place 1 tablet (0.4 mg total) under the tongue every 5 (five) minutes as needed for chest pain.  . Omega-3 Fatty Acids (FISH OIL) 1000 MG CPDR Take 3 g by mouth daily.  Marland Kitchen omeprazole (PRILOSEC) 20 MG capsule TAKE ONE CAPSULE BY MOUTH DAILY  . rosuvastatin (CRESTOR) 40 MG tablet TAKE ONE TABLET BY MOUTH DAILY  . varenicline (CHANTIX CONTINUING MONTH PAK) 1 MG tablet Take 1 tablet (1 mg total) by mouth 2 (two) times daily.  . [DISCONTINUED] varenicline (CHANTIX CONTINUING MONTH PAK) 1 MG tablet Take 1 tablet (1 mg total) by mouth 2 (two) times daily.  . [DISCONTINUED] varenicline (CHANTIX STARTING MONTH PAK) 0.5 MG X 11 & 1 MG X 42 tablet Take one 0.5 mg tablet by mouth once daily for 3 days, then increase to one 0.5 mg tablet twice daily for 4 days, then increase to one 1 mg tablet twice daily.    05/2016; OM1 PCI: SYNERGY DES 2.5X16 -overlaps OLD stent.  Cath February 13, 2018 similar findings when compared to prior cath.    Allergies  Allergen Reactions  . Niacin And Related Hives and Itching    SOCIAL HISTORY/FAMILY HISTORY   Reviewed in Epic:  Pertinent findings: He now has his last 2 children-sons both in college, one at [employer], another at [employer] (full scholarship - plans to do Programme researcher, broadcasting/film/video) -- Geovonni is very excited about this.  OBJCTIVE -PE, EKG, labs   Wt Readings from Last 3 Encounters:  11/05/19 185 lb (83.9 kg)  09/25/19 186 lb 8 oz (84.6 kg)  03/28/19 191 lb (86.6 kg)    Physical Exam: BP 128/88   Pulse 89   Temp (!) 96.2 F (35.7 C)   Ht 6' (1.829 m)   Wt 185 lb (83.9 kg)   SpO2 96%   BMI 25.09 kg/m  Physical Exam Vitals reviewed.  Constitutional:      General: He is not in acute distress.    Appearance: He is well-developed.     Comments: Relatively  healthy-appearing.  Well-groomed.  HENT:     Head: Normocephalic and atraumatic.  Neck:     Vascular: Carotid bruit (Soft bilateral carotid bruits.) present. No JVD.  Cardiovascular:     Rate and Rhythm: Normal rate and regular rhythm.  No extrasystoles are present.    Chest Wall: PMI is not displaced.     Pulses: Normal pulses.     Heart sounds: Normal heart sounds. No murmur heard.  No friction rub. No gallop.   Pulmonary:     Effort: Pulmonary effort is normal. No respiratory distress.     Breath sounds: No wheezing.  Abdominal:     General: Bowel sounds are normal. There is no distension.     Palpations: Abdomen is soft.     Tenderness: There is no abdominal tenderness.  Musculoskeletal:        General: No swelling. Normal range of motion.     Cervical back: Normal range of motion and neck supple.  Neurological:     General: No focal deficit present.     Mental Status: He is alert and oriented to person, place, and time. Mental status is at baseline.  Psychiatric:        Mood and Affect: Mood normal.        Behavior: Behavior normal.        Thought Content: Thought content normal.        Judgment: Judgment normal.     Adult  ECG Report  Rate: 90 ;  Rhythm: normal sinus rhythm and Normal axis, intervals and durations.;   Narrative Interpretation: Normal EKG  Recent Labs:    Lab Results  Component Value Date   CHOL 129 07/05/2018   HDL 30.90 (L) 07/05/2018   LDLCALC 43 01/12/2018   LDLDIRECT 53.0 07/05/2018   TRIG 346.0 (H) 07/05/2018   CHOLHDL 4 07/05/2018   Lab Results  Component Value Date   CREATININE 0.80 08/24/2018   BUN 13 07/05/2018   NA 140 07/05/2018   K 4.5 07/05/2018   CL 106 07/05/2018   CO2 26 07/05/2018   Lab Results  Component Value Date   TSH 2.330 10/12/2016   Lab Results  Component Value Date   HGBA1C 6.7 (H) 07/05/2018  `  ASSESSMENT/PLAN    Problem List Items Addressed This Visit    STEMI, acute inf. wall with 100% RCA stenosis,  DES 07/07/11 (prior Anterior STEMI in 2001) (Chronic)    At least 3 STEMI's with multiple episodes of unstable angina and non-ST elevation MIs.  Thankfully, his EF is pretty well-preserved so no major heart failure symptoms.  He has been revascularized extensively with all major vessels with exception of the circumflex proper.  Plan: Continue current meds.      Cardiomyopathy, ischemic; EF~45% with hypokinesis of the inferior and inferolateral myocardium; CO 3.25 (Chronic)    Borderline reduced EF but no active heart failure symptoms. Plan: Continue with diltiazem unless EF reduces any further..  Continue Toprol and Imdur.  He actually was unable to tolerate ARB or ACE-I because of borderline hypotension.      Coronary artery disease involving native coronary artery of native heart with angina pectoris (HCC) - Primary (Chronic)    Significant CAD.  We switched him to Effient for his multiple vessel PCI.  No longer on aspirin.  On combination of calcium channel blocker and beta-blocker along with Imdur for antianginal benefit and blood pressure control. On fenofibrate plus Crestor.  No major issues.  Plan: Continue current medications  Remains on maintenance dose of Effient--okay to hold Effient 7-9 days preop for any procedures or surgeries.      Relevant Orders   EKG 12-Lead (Completed)   Comprehensive Metabolic Panel (CMET)   Lipid Profile   CBC   Chronic stable angina (HCC) (Chronic)    Thankfully, since his last PCI, is not having any further episodes on current dose of beta-blocker, Blocker and Imdur.  Continue to push treatment of factors.      Encounter for smoking cessation counseling (Chronic)    He is was almost fully could be mated to being quit by the time he came to see me, but he ran out of his Chantix maintenance.  Now we need to restart.  I will refill his Chantix and hopefully he will be able to fully quit by the time I see him back.      Essential hypertension  (Chronic)    Blood pressure seems to be up and down.  No longer on ACE inhibitor/ARB because of low blood pressure.  Is now on stable dose of Toprol and diltiazem at low doses along with Imdur.  Blood pressure is doing well.      Hyperlipidemia with target LDL less than 70; by his report he is back on Crestor (Chronic)    LDL continues to be excellent as of May 2020.  Is due for labs to be checked now. Plan: Continue fenofibrate plus rosuvastatin.  Monitor  triglycerides and LDL.  Continue exercise.      Relevant Orders   Comprehensive Metabolic Panel (CMET)   Lipid Profile   CBC   Hypertriglyceridemia (Chronic)    He is on low-dose fenofibrate.  Low threshold to titrate further if triglycerides are still high.      Presence of drug coated stent in right coronary artery    He has stents in the right coronary artery LAD and circumflex-OM1.  Remains on maintenance dose of Effient which can be held 7-9 days preop for any procedures or surgeries.  On statin/fibrate, beta-blocker and calcium channel blocker.      Prediabetes    Monitor pressures and sugars closely.  Last A1c was 6.7.  He is not currently on any medications.  Hopefully with diet, exercise and weight loss, we can avoid progression of medications.         COVID-19 Education: The signs and symptoms of COVID-19 were discussed with the patient and how to seek care for testing (follow up with PCP or arrange E-visit).   The importance of social distancing was discussed today.  I spent a total of 22 minutes with the patient. >  50% of the time was spent in direct patient consultation.  Additional time spent with chart review  / charting (studies, outside notes, etc): 10 Total Time: 68min   Current medicines are reviewed at length with the patient today.  (+/- concerns) n/a   Patient Instructions / Medication Changes & Studies & Tests Ordered   Patient Instructions  Medication Instructions:  Restart Chantix  *If you  need a refill on your cardiac medications before your next appointment, please call your pharmacy*   Lab Work: CMP, Lipids, CBC If you have labs (blood work) drawn today and your tests are completely normal, you will receive your results only by: Marland Kitchen MyChart Message (if you have MyChart) OR . A paper copy in the mail If you have any lab test that is abnormal or we need to change your treatment, we will call you to review the results.   Testing/Procedures: None   Follow-Up: At [facility], you and your health needs are our priority.  As part of our continuing mission to provide you with exceptional heart care, we have created designated Provider Care Teams.  These Care Teams include your primary Cardiologist (physician) and Advanced Practice Providers (APPs -  Physician Assistants and Nurse Practitioners) who all work together to provide you with the care you need, when you need it.  We recommend signing up for the patient portal called "MyChart".  Sign up information is provided on this After Visit Summary.  MyChart is used to connect with patients for Virtual Visits (Telemedicine).  Patients are able to view lab/test results, encounter notes, upcoming appointments, etc.  Non-urgent messages can be sent to your provider as well.   To learn more about what you can do with MyChart, go to [email or web address].    Your next appointment:   6 month(s)  The format for your next appointment:   In Person  Provider:   Glenetta Hew, MD   Other Instructions None     Studies Ordered:   Orders Placed This Encounter  Procedures  . Comprehensive Metabolic Panel (CMET)  . Lipid Profile  . CBC  . EKG 12-Lead     Glenetta Hew, M.D., M.S. Interventional Cardiologist   Pager # [phone] Phone # [phone] [address] [place], [place] [address]   Thank you for choosing  [facility] at [facility]!!

## 2019-11-08 ENCOUNTER — Other Ambulatory Visit: Payer: Self-pay | Admitting: Internal Medicine

## 2019-11-08 DIAGNOSIS — I5042 Chronic combined systolic (congestive) and diastolic (congestive) heart failure: Secondary | ICD-10-CM

## 2019-11-12 ENCOUNTER — Encounter: Payer: Self-pay | Admitting: Cardiology

## 2019-11-12 NOTE — Assessment & Plan Note (Signed)
Blood pressure seems to be up and down.  No longer on ACE inhibitor/ARB because of low blood pressure.  Is now on stable dose of Toprol and diltiazem at low doses along with Imdur.  Blood pressure is doing well.

## 2019-11-12 NOTE — Assessment & Plan Note (Signed)
At least 3 STEMI's with multiple episodes of unstable angina and non-ST elevation MIs.  Thankfully, his EF is pretty well-preserved so no major heart failure symptoms.  He has been revascularized extensively with all major vessels with exception of the circumflex proper.  Plan: Continue current meds.

## 2019-11-12 NOTE — Assessment & Plan Note (Signed)
Borderline reduced EF but no active heart failure symptoms. Plan: Continue with diltiazem unless EF reduces any further..  Continue Toprol and Imdur.  He actually was unable to tolerate ARB or ACE-I because of borderline hypotension.

## 2019-11-12 NOTE — Assessment & Plan Note (Signed)
LDL continues to be excellent as of May 2020.  Is due for labs to be checked now. Plan: Continue fenofibrate plus rosuvastatin.  Monitor triglycerides and LDL.  Continue exercise.

## 2019-11-12 NOTE — Assessment & Plan Note (Signed)
Thankfully, since his last PCI, is not having any further episodes on current dose of beta-blocker, Blocker and Imdur.  Continue to push treatment of factors.

## 2019-11-12 NOTE — Assessment & Plan Note (Signed)
He is on low-dose fenofibrate.  Low threshold to titrate further if triglycerides are still high.

## 2019-11-12 NOTE — Assessment & Plan Note (Signed)
Significant CAD.  We switched him to Effient for his multiple vessel PCI.  No longer on aspirin.  On combination of calcium channel blocker and beta-blocker along with Imdur for antianginal benefit and blood pressure control. On fenofibrate plus Crestor.  No major issues.  Plan: Continue current medications  Remains on maintenance dose of Effient--okay to hold Effient 7-9 days preop for any procedures or surgeries.

## 2019-11-13 ENCOUNTER — Encounter: Payer: Self-pay | Admitting: Cardiology

## 2019-11-13 NOTE — Assessment & Plan Note (Signed)
Monitor pressures and sugars closely.  Last A1c was 6.7.  He is not currently on any medications.  Hopefully with diet, exercise and weight loss, we can avoid progression of medications.

## 2019-11-13 NOTE — Assessment & Plan Note (Signed)
He is was almost fully could be mated to being quit by the time he came to see me, but he ran out of his Chantix maintenance.  Now we need to restart.  I will refill his Chantix and hopefully he will be able to fully quit by the time I see him back.

## 2019-11-13 NOTE — Assessment & Plan Note (Signed)
He has stents in the right coronary artery LAD and circumflex-OM1.  Remains on maintenance dose of Effient which can be held 7-9 days preop for any procedures or surgeries.  On statin/fibrate, beta-blocker and calcium channel blocker.

## 2019-11-15 DIAGNOSIS — I25119 Atherosclerotic heart disease of native coronary artery with unspecified angina pectoris: Secondary | ICD-10-CM | POA: Diagnosis not present

## 2019-11-15 DIAGNOSIS — E785 Hyperlipidemia, unspecified: Secondary | ICD-10-CM | POA: Diagnosis not present

## 2019-11-16 ENCOUNTER — Telehealth: Payer: Self-pay | Admitting: Cardiology

## 2019-11-16 LAB — LIPID PANEL

## 2019-11-16 NOTE — Telephone Encounter (Signed)
Returned call to patient, he got a notification on mychart but is unable to see results or interpretation.   Advised results still in process, only CBC resulted and preliminary results seem stable.   Advised once resulted and reviewed by MD will call with results.

## 2019-11-16 NOTE — Telephone Encounter (Signed)
Troy Parrish is calling requesting to speak with a nurse in regards to his lab results. Please advise.

## 2019-11-17 LAB — CBC
Hematocrit: 51.9 % — ABNORMAL HIGH (ref 37.5–51.0)
Hemoglobin: 17.7 g/dL (ref 13.0–17.7)
MCH: 30 pg (ref 26.6–33.0)
MCHC: 34.1 g/dL (ref 31.5–35.7)
MCV: 88 fL (ref 79–97)
Platelets: 391 10*3/uL (ref 150–450)
RBC: 5.9 x10E6/uL — ABNORMAL HIGH (ref 4.14–5.80)
RDW: 13 % (ref 11.6–15.4)
WBC: 13.1 10*3/uL — ABNORMAL HIGH (ref 3.4–10.8)

## 2019-11-17 LAB — LIPID PANEL
Chol/HDL Ratio: 4.4 ratio (ref 0.0–5.0)
Cholesterol, Total: 141 mg/dL (ref 100–199)
HDL: 32 mg/dL — ABNORMAL LOW (ref >39)
LDL Chol Calc (NIH): 73 mg/dL (ref 0–99)
Triglycerides: 214 mg/dL — ABNORMAL HIGH (ref 0–149)
VLDL Cholesterol Cal: 36 mg/dL (ref 5–40)

## 2019-11-17 LAB — COMPREHENSIVE METABOLIC PANEL
ALT: 31 IU/L (ref 0–44)
AST: 27 IU/L (ref 0–40)
Albumin/Globulin Ratio: 1.6 (ref 1.2–2.2)
Albumin: 4.4 g/dL (ref 3.8–4.9)
Alkaline Phosphatase: 145 IU/L — ABNORMAL HIGH (ref 44–121)
BUN/Creatinine Ratio: 14 (ref 9–20)
BUN: 16 mg/dL (ref 6–24)
Bilirubin Total: 0.3 mg/dL (ref 0.0–1.2)
CO2: 19 mmol/L — ABNORMAL LOW (ref 20–29)
Calcium: 9.5 mg/dL (ref 8.7–10.2)
Chloride: 103 mmol/L (ref 96–106)
Creatinine, Ser: 1.14 mg/dL (ref 0.76–1.27)
GFR calc Af Amer: 81 mL/min/{1.73_m2} (ref >59)
GFR calc non Af Amer: 70 mL/min/{1.73_m2} (ref >59)
Globulin, Total: 2.7 g/dL (ref 1.5–4.5)
Glucose: 154 mg/dL — ABNORMAL HIGH (ref 65–99)
Potassium: 4.9 mmol/L (ref 3.5–5.2)
Sodium: 139 mmol/L (ref 134–144)
Total Protein: 7.1 g/dL (ref 6.0–8.5)

## 2019-11-26 ENCOUNTER — Telehealth: Payer: Self-pay | Admitting: Internal Medicine

## 2019-11-26 NOTE — Telephone Encounter (Signed)
Called pt to schedule physical , but no answer , LFT VM

## 2019-12-26 ENCOUNTER — Other Ambulatory Visit: Payer: Self-pay

## 2019-12-26 ENCOUNTER — Ambulatory Visit (INDEPENDENT_AMBULATORY_CARE_PROVIDER_SITE_OTHER): Payer: PPO

## 2019-12-26 DIAGNOSIS — Z23 Encounter for immunization: Secondary | ICD-10-CM

## 2020-01-06 ENCOUNTER — Other Ambulatory Visit: Payer: Self-pay | Admitting: Internal Medicine

## 2020-01-06 DIAGNOSIS — I5042 Chronic combined systolic (congestive) and diastolic (congestive) heart failure: Secondary | ICD-10-CM

## 2020-02-05 ENCOUNTER — Other Ambulatory Visit: Payer: Self-pay | Admitting: Internal Medicine

## 2020-02-05 DIAGNOSIS — I5042 Chronic combined systolic (congestive) and diastolic (congestive) heart failure: Secondary | ICD-10-CM

## 2020-02-09 ENCOUNTER — Other Ambulatory Visit: Payer: Self-pay | Admitting: Cardiology

## 2020-02-18 DIAGNOSIS — Z03818 Encounter for observation for suspected exposure to other biological agents ruled out: Secondary | ICD-10-CM | POA: Diagnosis not present

## 2020-02-18 DIAGNOSIS — Z20822 Contact with and (suspected) exposure to covid-19: Secondary | ICD-10-CM | POA: Diagnosis not present

## 2020-03-04 ENCOUNTER — Other Ambulatory Visit: Payer: Self-pay | Admitting: Internal Medicine

## 2020-03-04 DIAGNOSIS — I5042 Chronic combined systolic (congestive) and diastolic (congestive) heart failure: Secondary | ICD-10-CM

## 2020-03-12 ENCOUNTER — Other Ambulatory Visit: Payer: Self-pay | Admitting: Internal Medicine

## 2020-03-18 ENCOUNTER — Ambulatory Visit: Payer: PPO | Admitting: Internal Medicine

## 2020-03-27 ENCOUNTER — Other Ambulatory Visit: Payer: Self-pay | Admitting: Cardiology

## 2020-03-31 ENCOUNTER — Other Ambulatory Visit: Payer: Self-pay | Admitting: Cardiology

## 2020-04-04 ENCOUNTER — Telehealth: Payer: Self-pay | Admitting: Cardiology

## 2020-04-04 MED ORDER — PRASUGREL HCL 10 MG PO TABS: 10.0000 mg | ORAL_TABLET | Freq: Every day | ORAL | 2 refills | Status: AC

## 2020-04-04 NOTE — Telephone Encounter (Signed)
Spoke with patient who states that his Effient was on backorder at his pharmacy and will be unable to get it until March 1st. Patient states that he wanted to make sure he could get it as he did not want to miss any doses.   Spoke with patients Pharmacy- stated they have generic Effient (Prasugrel) but not brand name.   Spoke with DOD Harrell Gave) who states that it was fine to call in Generic Prasugrel 10mg  for patient. Called patient and advised patient that prescription has been sent in to pharmacy and to call back with any issues, questions, or concerns. Patient verbalized understanding.

## 2020-04-04 NOTE — Telephone Encounter (Signed)
Patient calling the office for samples of medication:   1.  What medication and dosage are you requesting samples for? EFFIENT 10 MG TABS tablet  2.  Are you currently out of this medication?   Yes, patient's wife states the patient has been completely out for 4-5 days (see 04/04/20 patient message). Medication is on backorder at their local pharmacy. Are any Effient samples available at the office? Please return call to the patient at [phone].

## 2020-04-05 ENCOUNTER — Other Ambulatory Visit: Payer: Self-pay | Admitting: Cardiology

## 2020-04-07 NOTE — Telephone Encounter (Signed)
RN will wait until  patient call back to inform office - effient /or genric is still on backorder.

## 2020-04-07 NOTE — Telephone Encounter (Signed)
If he is not able to get thoracic well, then we should switch him to Plavix.  dh

## 2020-04-07 NOTE — Telephone Encounter (Signed)
Called left message whether patient was able to get medication  And /or contacted another pharmacy to see if medication was in stock.  Will also defer to Dr Ellyn Hack to see if there is an alterative, if medication is till on back order.

## 2020-04-07 NOTE — Telephone Encounter (Signed)
Patient called back . He states  He was able to get a 30 day supply form [facility] of generic  Prasugrel. Patient states his supply should be at his regular pharmacy by this Friday.  He will obtain a 90 day supply as soon as insurance will allow.

## 2020-04-18 ENCOUNTER — Telehealth: Payer: Self-pay | Admitting: *Deleted

## 2020-04-18 NOTE — Telephone Encounter (Signed)
Called pt no answer and VM is full 

## 2020-04-18 NOTE — Telephone Encounter (Signed)
That's a personal decision she will have to make. As far as I am aware, the CDC has not endorsed a 4th Covid booster.

## 2020-04-18 NOTE — Telephone Encounter (Signed)
Pt has a family emergency and is going to [facility] in April. He wants to know if PCP will recommend he get a 4th covid booster. He said he would "Feel more comfortable" going out of town if he got another booster but wanted to see what PCP thought

## 2020-05-01 ENCOUNTER — Other Ambulatory Visit: Payer: Self-pay

## 2020-05-01 ENCOUNTER — Encounter: Payer: Self-pay | Admitting: Cardiology

## 2020-05-01 ENCOUNTER — Ambulatory Visit: Payer: PPO | Admitting: Cardiology

## 2020-05-01 VITALS — BP 117/75 | HR 91 | Ht 66.0 in | Wt 181.0 lb

## 2020-05-01 DIAGNOSIS — Z955 Presence of coronary angioplasty implant and graft: Secondary | ICD-10-CM

## 2020-05-01 DIAGNOSIS — G4733 Obstructive sleep apnea (adult) (pediatric): Secondary | ICD-10-CM

## 2020-05-01 DIAGNOSIS — K2100 Gastro-esophageal reflux disease with esophagitis, without bleeding: Secondary | ICD-10-CM

## 2020-05-01 DIAGNOSIS — I2109 ST elevation (STEMI) myocardial infarction involving other coronary artery of anterior wall: Secondary | ICD-10-CM | POA: Diagnosis not present

## 2020-05-01 DIAGNOSIS — I10 Essential (primary) hypertension: Secondary | ICD-10-CM

## 2020-05-01 DIAGNOSIS — I5032 Chronic diastolic (congestive) heart failure: Secondary | ICD-10-CM | POA: Diagnosis not present

## 2020-05-01 DIAGNOSIS — I255 Ischemic cardiomyopathy: Secondary | ICD-10-CM

## 2020-05-01 DIAGNOSIS — I2111 ST elevation (STEMI) myocardial infarction involving right coronary artery: Secondary | ICD-10-CM | POA: Diagnosis not present

## 2020-05-01 DIAGNOSIS — E781 Pure hyperglyceridemia: Secondary | ICD-10-CM | POA: Diagnosis not present

## 2020-05-01 DIAGNOSIS — E785 Hyperlipidemia, unspecified: Secondary | ICD-10-CM

## 2020-05-01 DIAGNOSIS — I25119 Atherosclerotic heart disease of native coronary artery with unspecified angina pectoris: Secondary | ICD-10-CM | POA: Diagnosis not present

## 2020-05-01 MED ORDER — OMEPRAZOLE 40 MG PO CPDR: 40.0000 mg | DELAYED_RELEASE_CAPSULE | Freq: Every day | ORAL | 3 refills | Status: DC

## 2020-05-01 NOTE — Progress Notes (Signed)
Primary Care Provider: Jearld Fenton, NP Cardiologist: Glenetta Hew, MD Electrophysiologist: None  Clinic Note: Chief Complaint  Patient presents with  . Follow-up    6 months.  Doing well.  . Coronary Artery Disease    No angina  . Nicotine Dependence    Getting closer to quitting.   ===================================  ASSESSMENT/PLAN   Problem List Items Addressed This Visit    STEMI, acute inf. wall with 100% RCA stenosis, DES 07/07/11 (prior Anterior STEMI in 2001) - Primary (Chronic)    At least 2 STEMI's and multiple episodes of unstable angina/non-STEMI.  EF prewell-preserved indicating that most the STEMI's did not cause significant damage.  No recurrent angina or heart failure symptoms on stable regimen... He has had revascularization in all 3 major territories.      Cardiomyopathy, ischemic; EF~45% with hypokinesis of the inferior and inferolateral myocardium; CO 3.25 (Chronic)    Borderline LVEF with no active heart failure symptoms. Last time I saw him he was on diltiazem, not currently on it.  Except hold with current dose of Toprol at 37.5 and no significant fatigue.  Has not tolerated ARB/ACE inhibitor in the past for afterload reduction, and has been relatively euvolemic not requiring diuretic.  Could consider the possibility of using chlorthalidone for blood pressure and diuresis if symptoms recur.      ST elevation myocardial infarction (STEMI) of anterior wall -- PCI to LAD (Chronic)    Despite having anterior STEMI, he still has preserved EF.  We have done extensive PCI to the distal LAD as well.      Coronary artery disease involving native coronary artery of native heart with angina pectoris ([place]) (Chronic)    Doing well with no recent anginal symptoms.  In the last PCI was in 2018.  Has been doing well since then.  No longer on diltiazem-not sure how that got stopped, but for now he is doing okay so we will stay off of it.  Likely related to  fatigue.  Plan: Continue current dose of Toprol along with Effient.  No aspirin. Continue rosuvastatin and fenofibrate.  Lipids look well controlled.  Continue to work on smoking cessation.       Chronic diastolic congestive heart failure, NYHA class 1 (HCC) (Chronic)    Doing well.  No active symptoms.  Not on same dose of diuretic.  Only on beta-blocker.      Presence of drug coated stent in right coronary artery (Chronic)    With extensive PCI to just but every vessel system, he is on lifelong Thienopyridine therapy.  Did not do well with Brilinta and Plavix.  He is on standing dose of prasugrel.  Okay to hold 7 days preop for surgeries (9 days for neurologic or spinal procedures)      Essential hypertension (Chronic)    Stable BP on current meds. Has been on combination of Toprol and diltiazem, and the diltiazem has been discontinued.  Monitor closely, but I suspect that we may go back on diltiazem if angina recurs.      Hyperlipidemia with target LDL less than 70; by his report he is back on Crestor (Chronic)    Lipids recently checked look great.  Stable on rosuvastatin plus fenofibrate for hypertriglyceridemia.  Doing well.      OSA (obstructive sleep apnea) (Chronic)    Insurance issues with CPAP.  Hopefully he can get back on it.      Reflux esophagitis (Chronic)  Symptoms are little worse.  He is on omeprazole, increased dose of 40 mg daily.      Hypertriglyceridemia (Chronic)    Remains on fenofibrate plus statin.  Last triglycerides repeat well-controlled.        ===================================  HPI:    Anchor Dwan. is a 60 y.o. male with extensive CAD history reviewed below who presents today for 21-month follow-up.  He has a very significant multivessel coronary disease history with at least 3 STEMI's and at least 3 if not 4 episodes of progressive /unstable angina leading to additional PCI (now all 3 major arteries s/p PCI). He also has very  difficult/poorly controlled lipids. He intermittently goes back to smoking, but is now in the process of trying to complete his quitting.  Has had blood pressure issues with adding ARB or amlodipine, therefore we changed to diltiazem --> doing much better since converting from amlodipine to diltiazem.  Less fatigue being off of the high-dose beta-blocker. Palpitations improved.   Unable to afford Ranexa in the past   Most recent cardiac catheterization was in January 2020 (reviewed below) showing stable coronary disease with patent stents.  Only mild progression of disease in the very proximal LAD but not physiologically significant.  Biggest issue is working on smoking cessation.   Seymore Brodowski. was last seen on November 05, 2019 --> he was in great spirits.  Feeling well.  Doing well.  Blood pressures been stable.  He was feeling healthy.  Was only upset of the fact that he had to stop using Chantix because of  what he felt was an insurance issue, but was actually that Chantix was discontinued.  He was in the final stages of trying to quit smoking -> he was down about a quarter a pack a day.  He was getting ready to celebrate his 18th wedding anniversary, planning a big trip with his wife along with several other trips. They recently returned from the [facility] in August, they went to several Concerts in September.  No chest pain or pressure.  Just off-and-on palpitations.  Only some dyspnea exertion related to smoking, but much better.  Allergy cleared up.  Recent Hospitalizations: None  Reviewed  CV studies:    The following studies were reviewed today: (if available, images/films reviewed: From Epic Chart or Care Everywhere) . none:   Interval History:   Mckade Gurka. returns yet again in great spirits.  He has very happy with how well he is doing.  He is now active, he is adjusted his diet and is trying to lose weight.  He is getting closer to quitting smoking.  The only issue  right now has been some GERD problems and try to get insurance "squared away "and dealing with his sleep apnea CPAP .   He really does not complain of anything like palpitations or syncope/near syncope, no angina or heart failure symptoms.  He is very active now and hoping to go back out [place] in the next month or 2 to visit his family.  Is very sad about seeing his grandkids.  They are planning on doing a lot of walking and hiking.  He is working on getting in shape again.  Apologies little out of shape because of the winter weather.  But otherwise no complaints.   CV Review of Symptoms (Summary) Cardiovascular ROS: positive for - Baseline exertional dyspnea, but getting better.  Feeling better with weight loss.  Having an issue with his  OSA treatment due to insurance.  Not sleeping as well. negative for - chest pain, edema, irregular heartbeat, orthopnea, palpitations, paroxysmal nocturnal dyspnea, rapid heart rate, shortness of breath or Lightheadedness, dizziness or wooziness, syncope/near syncope or TIA/amaurosis fugax, claudication  The patient does not have symptoms concerning for COVID-19 infection (fever, chills, cough, or new shortness of breath).   REVIEWED OF SYSTEMS   Review of Systems  Constitutional: Positive for weight loss (A little bit.  He had gained some weight over Christmas.). Negative for malaise/fatigue.  HENT: Negative for congestion and nosebleeds.   Respiratory: Positive for cough (Morning smoker's cough) and shortness of breath (Baseline-improved). Negative for hemoptysis, sputum production and wheezing (Only with allergies).   Cardiovascular: Negative for leg swelling.  Gastrointestinal: Positive for heartburn (He has had a lot of GERD with a sour taste in his mouth.  Better with increased dose of PPI.). Negative for blood in stool, constipation and melena.  Genitourinary: Negative for hematuria.  Musculoskeletal: Positive for back pain and joint pain (Normal aches and  pains). Negative for falls.  Neurological: Negative for dizziness, weakness and headaches.  Endo/Heme/Allergies: Positive for environmental allergies (Actually okay right now.).  Psychiatric/Behavioral: Negative.        He is in really good spirits.  He only wished to get sleep better.  No longer having depression issues.    I have reviewed and (if needed) personally updated the patient's problem list, medications, allergies, past medical and surgical history, social and family history.   PAST MEDICAL HISTORY   Past Medical History:  Diagnosis Date  . Bronchitis    Gets bronchitis almost every year  . CAD S/P percutaneous coronary angioplasty 06/2011; 6/ & 11/2013   S/P PCI to all 3 major vessels;a)  Ant STEMI 2001- BMS-LAD x2 -->(redo PCI 6/'15 -- pLAD Xience DES 2.5 x 12- 2.75 mm, mLAD 2.25 x 12 - 2.7 mm), b) '06 UA --> Cx- OM DES; c) 2013 Inf MI BMS mRCA --> d) 10/'15 PCI dRCA Promus P DES 4.0 x 16 (4.25 mm); PTCA of RPL2 (2.0 mm) &RPDA (2.25 mm) 11/2013; 4/18 PCI OM1 Synergy DES  2.5 x 16  . COPD (chronic obstructive pulmonary disease) ([place])   . Elevated WBC count   . Emphysema of lung ([place])   . Essential hypertension 07/07/2011  . Former heavy tobacco smoker     quit in May 2015 after multiple attempts at trying to quit before   . GERD (gastroesophageal reflux disease)   . Glucose intolerance (pre-diabetes) June 2015    hemoglobin A1c 6.6  . Headache    "Imdur related; stopped taking it; headaches went away" (11/15/2013)  . Heart murmur   . History of colon polyps   . History of stomach ulcers   . Hyperlipidemia with target LDL less than 70 07/07/2011  . Metabolic syndrome    Pre-diabetes, hypertension and truncal obesity as well as dyslipidemia  . OSA on CPAP   . Pneumonia "several times"  . Recurrent upper respiratory infection (URI)   . Seizures ([place]) "several"   "last one was ~ 2011" (11/15/2013)  . Shortness of breath dyspnea   . ST elevation myocardial infarction (STEMI)  of anterior wall ([place]) 2001   History of -- 2 stents in early and distal mid LAD; prior cardiologist was Dr. Remi Haggard in [place]  . ST elevation myocardial infarction (STEMI) of inferior wall ([place]) 07/07/2011   Occluded RCA 5.28U13 INTEGRITY; Echo 07/2013: EF 45-50%, Inferior & Posterolateral HK.   Marland Kitchen  Umbilical hernia   . Unstable angina (HCC) 2006; 07/2013   Cx-OM - PCI 2.5 mm 13 mm Cypher DES Jinny Blossom, [place]) ; 07/2013: Severe ISR of both prox & Distal LAD stentS --> 2 DES stents (1 at prox edge of the proximal stent, 2nd covers the entire distal stent as well as proximal and distal edge stenose)   . Upper respiratory infection     PAST SURGICAL HISTORY   Past Surgical History:  Procedure Laterality Date  . Cardiac MRI [facility]  09/2014   EF 51%. Mod HK of basal-mid Inf wall, mild HK of basal inferoseptum & basal-mid inferolateral wall with hyper enhancement (c/w subendocard RCA MI with significant viability), focal hyper enhancement of apical wall c/w dLAD subendocard MI & complete LAD viability. . No aortic stenosis. Normal RV function. No Ischemia on Adenosine Stress.  . COLONOSCOPY WITH PROPOFOL N/A 03/28/2015   Procedure: COLONOSCOPY WITH PROPOFOL;  Surgeon: Midge Minium, MD;  Location: [facility];  Service: Endoscopy;  Laterality: N/A;  . CORONARY ANGIOPLASTY WITH STENT PLACEMENT  2001   ANTERIOR mi with a  PCI to LAD; [place], [place] - Dr. Elson Clan  . CORONARY ANGIOPLASTY WITH STENT PLACEMENT  2006   [place] by Dr Elson Clan -lesion lft circ/OM1 with 2.5 x 39mm Cypher DES  . CORONARY STENT INTERVENTION N/A 05/19/2016   Procedure: Coronary Stent Intervention;  Surgeon: Marykay Lex, MD;  Location: [facility] INVASIVE CV LAB;  Service: Cardiovascular: PCI pOM1 - Synergy DES 2.5 x 16 (overlaps old stent)  . ESOPHAGOGASTRODUODENOSCOPY (EGD) WITH PROPOFOL N/A 03/28/2015   Procedure: ESOPHAGOGASTRODUODENOSCOPY (EGD) WITH PROPOFOL;  Surgeon: Midge Minium, MD;  Location: [facility];   Service: Endoscopy;  Laterality: N/A;  . INSERTION OF MESH N/A 04/02/2015   Procedure: INSERTION OF MESH;  Surgeon: Lattie Haw, MD;  Location: [facility] ORS;  Service: General;  Laterality: N/A;  . LEFT AND RIGHT HEART CATHETERIZATION WITH CORONARY ANGIOGRAM N/A 07/30/2013   Procedure: LEFT AND RIGHT HEART CATHETERIZATION WITH CORONARY ANGIOGRAM;  Surgeon: Marykay Lex, MD;  Location: [facility] CATH LAB;  Service: Cardiovascular;  2 separate P & mLAD lesions -> PCI, MOd-Severe dRCA disesase with diffuse RPL/PDA disease (FFR)  . LEFT HEART CATH  08/03/2013   Procedure: LEFT HEART CATH;  Surgeon: Marykay Lex, MD;  Location: [facility] CATH LAB;  Service: Cardiovascular;;FFR of RCA - non-flow-limiting  . LEFT HEART CATH AND CORONARY ANGIOGRAPHY N/A 05/19/2016   Procedure: Left Heart Cath and Coronary Angiography;  Surgeon: Marykay Lex, MD;  Location: [facility] INVASIVE CV LAB;  Service: Cardiovascular: CULPRIT - 85% pOM1 pre-stent. Stents in both proximal and distal RCA widely patent. PTCA sites in RPL & PDA widely patent with stable 70% small RPL 2. Extensive stenting in the LAD widely patent stents with mild to mod Dz btw prox & mid-distal stents.   Marland Kitchen LEFT HEART CATH AND CORONARY ANGIOGRAPHY N/A 02/13/2018   Procedure: LEFT HEART CATH AND CORONARY ANGIOGRAPHY;  Surgeon: Marykay Lex, MD;  Location: [facility] INVASIVE CV LAB;  Service: Cardiovascular: Similar findings to last cath post PCI.  Stents are patent.  Roughly 50% proximal LAD lesion noted, may be progression from prior cath but otherwise stable disease.  Marland Kitchen LEFT HEART CATHETERIZATION WITH CORONARY ANGIOGRAM N/A 07/07/2011   Procedure: LEFT HEART CATHETERIZATION WITH CORONARY ANGIOGRAM;  Surgeon: Marykay Lex, MD;  Location: [facility] CATH LAB;  Service: Cardiovascular;  inferolat post LV infarct ST-elevation MI  . MET/CPET  11/09/2011   submax. effort 1.05  RER peak V02 was 51% ,chronotropic incomp.hrt rate lows 80s to 100  . MET/CPET  September 2016   [facility]: Mild  functional impairment due primarily to mild pulmonary and circulatory limitations. Also suggest physical deconditioning (seen with low-normal aerobic reserve). Mild VQ mismatch with exercise.  Ventilatory reserve exhausted with peak exercise demonstrated pulmonary limitation. No significant decrease in postexercise FEV1 compared to rest --> suggest no exercise induced bronchospasm  . NM MYOVIEW ([place] HX)  11/30/2011   EF 45% ,exercise 10 METS, infarct\scar w mild perinfarct ischemia -basal inferolat and mid inferolat region  . NM MYOVIEW LTD  04/25/2016   Exercised for 9 minutes. Reached 90% max. Heart rate. 9.4 METS. Read as a low risk study with normal EF 55-65%. -> on Review -  there does appear to be a small to moderate sized, medium severtiy reversible perfusion defect in the anterior wall - read by computer, but not noted by reader.  Marland Kitchen PERCUTANEOUS CORONARY STENT INTERVENTION (PCI-S)  07/07/2011   Procedure: PERCUTANEOUS CORONARY STENT INTERVENTION (PCI-S);  Surgeon: Leonie Man, MD;  Location: [facility] CATH LAB;  Service: Cardiovascular;;occluded RCA ,Integrity Resolute DES 2.75 x 18 mm stent - post-dilated 3.1 mm  . PERCUTANEOUS CORONARY STENT INTERVENTION (PCI-S)  07/30/2013   Procedure: PERCUTANEOUS CORONARY STENT INTERVENTION (PCI-S);  Surgeon: Leonie Man, MD;  Location: [facility] CATH LAB;  Service: Cardiovascular;;Distal mid LAD: Xience Alpine DES 2.25 mm x 28 mm (overlapping proximal and distal edge of previous stent) - 2.7 mm; proximal LAD 2.5 mm x 12 mm Xience Alpine DES (2.8 mm);; RCA 60-70% stenosis planned staged procedure  . PERCUTANEOUS CORONARY STENT INTERVENTION (PCI-S) N/A 11/15/2013   Procedure: PERCUTANEOUS CORONARY STENT INTERVENTION (PCI-S);  Surgeon: Leonie Man, MD;  Location: [facility] CATH LAB;  Service: Cardiovascular;  Patent Cx & LAD Stents; PCI -dRCA Promus Premier DES 4.0 mm x 16 mm (4.25 mm) dRCA, 2.0 mm Cutting PTCA of RPL2 [place] & POBA of mRPDA  . POLYPECTOMY  03/28/2015    Procedure: POLYPECTOMY;  Surgeon: Lucilla Lame, MD;  Location: [place];  Service: Endoscopy;;  . RIGHT HEART CATH  June 2015   Normal pressures with severely reduced cardiac output and index. (3.25/1.55)  . TRANSTHORACIC ECHOCARDIOGRAM  07/31/2013; February 2017   a. LVEF 45-50. Inferior posterior hypokinesis with mild dilation. Apparent normal diastolic pressures;   . UMBILICAL HERNIA REPAIR N/A 04/02/2015   Procedure: HERNIA REPAIR UMBILICAL ADULT;  Surgeon: Florene Glen, MD;  Location: [facility] ORS;  Service: General;  Laterality: N/A;    05/2016; OM1 XQJ:JHERDEY DES 2.5X16 -overlaps OLD stent. ? Cath January 6, 2020similar findings when compared to prior cath.    Immunization History  Administered Date(s) Administered  . Influenza Split 11/09/2012  . Influenza,inj,Quad PF,6+ Mos 11/16/2013, 11/29/2014, 11/11/2015, 11/10/2016, 12/01/2017, 12/07/2018, 12/26/2019  . Moderna Sars-Covid-2 Vaccination 04/25/2019, 05/14/2019, 01/10/2020  . Pneumococcal Conjugate-13 11/29/2014, 05/05/2020  . Pneumococcal Polysaccharide-23 07/11/2011, 11/10/2016, 01/02/2019  . Tdap 03/04/2015    MEDICATIONS/ALLERGIES   Current Meds  Medication Sig  . albuterol (VENTOLIN HFA) 108 (90 Base) MCG/ACT inhaler INHALE TWO PUFFS BY MOUTH EVERY 6 HOURS AS NEEDED FOR SHORTNESS OF BREATH  . clobetasol cream (TEMOVATE) 0.05 % APPLY TOPICALLY TO AFFECTED AREA(S) DAILY AS NEEDED FOR ECZEMA (Patient taking differently: Apply 1 application topically daily as needed (eczema).)  . fenofibrate (TRICOR) 48 MG tablet TAKE ONE TABLET BY MOUTH DAILY  . fluticasone (FLONASE) 50 MCG/ACT nasal spray Place 2 sprays into both nostrils daily. (Patient taking differently:  Place 2 sprays into both nostrils daily as needed (congestion).)  . Fluticasone-Salmeterol (ADVAIR) 100-50 MCG/DOSE AEPB INHALE ONE PUFF BY MOUTH TWICE A DAY - IN THE MORNING AND AT BEDTIME  . Melatonin 1 MG CAPS Take 1 mg by mouth at bedtime as needed  (sleep).  . metoprolol succinate (TOPROL XL) 25 MG 24 hr tablet Take 1.5 tablets (37.5 mg total) by mouth every evening.  . nitroGLYCERIN (NITROSTAT) 0.4 MG SL tablet Place 1 tablet (0.4 mg total) under the tongue every 5 (five) minutes as needed for chest pain.  . Omega-3 Fatty Acids (FISH OIL) 1000 MG CPDR Take 3 g by mouth daily.  Marland Kitchen omeprazole (PRILOSEC) 20 MG capsule TAKE ONE CAPSULE BY MOUTH DAILY  . prasugrel (EFFIENT) 10 MG TABS tablet Take 1 tablet (10 mg total) by mouth daily.  . rosuvastatin (CRESTOR) 40 MG tablet TAKE ONE TABLET BY MOUTH DAILY  . [DISCONTINUED] diltiazem (CARDIZEM CD) 120 MG 24 hr capsule Take 1 capsule (120 mg total) by mouth every morning.    Allergies  Allergen Reactions  . Niacin And Related Hives and Itching    SOCIAL HISTORY/FAMILY HISTORY   Reviewed in Epic:  Pertinent findings:  Social History   Tobacco Use  . Smoking status: Current Every Day Smoker    Packs/day: 0.50    Years: 40.00    Pack years: 20.00    Types: Cigarettes    Last attempt to quit: 01/19/2017    Years since quitting: 3.3  . Smokeless tobacco: Never Used  . Tobacco comment: Quit with Bupropion $RemoveBefore'150mg'tsRZWpxOpiTAT$  - > unfortunately, he restarted  Substance Use Topics  . Alcohol use: Yes    Comment: rare  . Drug use: No   Social History   Social History Narrative   He is a married father of 41, grandfather of 31. He does not really get routine exercise.    He is down to 5 or 6 cigarettes a day. He is very seriously wanting to quit. This is a significant cutback for him, but he has pretty much been told he needs to stop by his wife, and so he fully intends to do so. He is willing to try the patches or whatever. He has a social alcoholic beverage every now and then.     OBJCTIVE -PE, EKG, labs   Wt Readings from Last 3 Encounters:  05/05/20 181 lb (82.1 kg)  05/01/20 181 lb (82.1 kg)  11/05/19 185 lb (83.9 kg)    Physical Exam: BP 117/75   Pulse 91   Ht $R'5\' 6"'YY$  (1.676 m)   Wt 181  lb (82.1 kg)   SpO2 95%   BMI 29.21 kg/m  Physical Exam Constitutional:      General: He is not in acute distress.    Appearance: Normal appearance. He is normal weight. He is not ill-appearing or toxic-appearing.  HENT:     Head: Normocephalic and atraumatic.  Neck:     Vascular: Carotid bruit (Soft bilateral) present.  Cardiovascular:     Rate and Rhythm: Normal rate and regular rhythm.     Pulses: Normal pulses.     Heart sounds: No murmur heard. Friction rub present. No gallop.   Pulmonary:     Effort: Pulmonary effort is normal. No respiratory distress.     Breath sounds: Normal breath sounds. No wheezing, rhonchi or rales.  Chest:     Chest wall: No tenderness.  Musculoskeletal:        General: No swelling. Normal  range of motion.     Cervical back: Normal range of motion and neck supple.  Skin:    General: Skin is warm and dry.  Neurological:     General: No focal deficit present.     Mental Status: He is alert and oriented to person, place, and time.     Motor: No weakness.     Gait: Gait normal.  Psychiatric:        Mood and Affect: Mood normal.        Behavior: Behavior normal.        Thought Content: Thought content normal.        Judgment: Judgment normal.     Adult ECG Report n/a  Recent Labs: Reviewed Lab Results  Component Value Date   CHOL 117 05/05/2020   HDL 36.40 (L) 05/05/2020   LDLCALC 62 05/05/2020   LDLDIRECT 53.0 07/05/2018   TRIG 91.0 05/05/2020   CHOLHDL 3 05/05/2020   Lab Results  Component Value Date   CREATININE 0.85 05/05/2020   BUN 13 05/05/2020   NA 140 05/05/2020   K 4.4 05/05/2020   CL 106 05/05/2020   CO2 25 05/05/2020   CBC Latest Ref Rng & Units 05/05/2020 11/15/2019 07/05/2018  WBC 4.0 - 10.5 K/uL 12.7(H) 13.1(H) 14.1(H)  Hemoglobin 13.0 - 17.0 g/dL 16.6 17.7 17.0  Hematocrit 39.0 - 52.0 % 49.2 51.9(H) 48.9  Platelets 150.0 - 400.0 K/uL 355.0 391 375.0    Lab Results  Component Value Date   TSH 2.330 10/12/2016     ==================================================  COVID-19 Education: The signs and symptoms of COVID-19 were discussed with the patient and how to seek care for testing (follow up with PCP or arrange E-visit).   The importance of social distancing and COVID-19 vaccination was discussed today. The patient is practicing social distancing & Masking.   I spent a total of 72minutes with the patient spent in direct patient consultation.  Additional time spent with chart review  / charting (studies, outside notes, etc): 12 min Total Time: 32 min   Current medicines are reviewed at length with the patient today.  (+/- concerns) n/a  This visit occurred during the SARS-CoV-2 public health emergency.  Safety protocols were in place, including screening questions prior to the visit, additional usage of staff PPE, and extensive cleaning of exam room while observing appropriate contact time as indicated for disinfecting solutions.  Notice: This dictation was prepared with Dragon dictation along with smaller phrase technology. Any transcriptional errors that result from this process are unintentional and may not be corrected upon review.  Patient Instructions / Medication Changes & Studies & Tests Ordered   Patient Instructions  Medication Instructions:  Increase Omeprazole 40 mg daily    *If you need a refill on your cardiac medications before your next appointment, please call your pharmacy*   Lab Work: Not needed    Testing/Procedures:  Not needed  Follow-Up: At [facility], you and your health needs are our priority.  As part of our continuing mission to provide you with exceptional heart care, we have created designated Provider Care Teams.  These Care Teams include your primary Cardiologist (physician) and Advanced Practice Providers (APPs -  Physician Assistants and Nurse Practitioners) who all work together to provide you with the care you need, when you need it.      Your next appointment:   6 month(s)  The format for your next appointment:   In Person  Provider:   Glenetta Hew,  MD     Studies Ordered:   No orders of the defined types were placed in this encounter.    Glenetta Hew, M.D., M.S. Interventional Cardiologist   Pager # [phone] Phone # [phone] [address] [place], [place] [address]   Thank you for choosing Heartcare at [facility]!!

## 2020-05-01 NOTE — Patient Instructions (Addendum)
Medication Instructions:  Increase Omeprazole 40 mg daily    *If you need a refill on your cardiac medications before your next appointment, please call your pharmacy*   Lab Work: Not needed    Testing/Procedures:  Not needed  Follow-Up: At [facility], you and your health needs are our priority.  As part of our continuing mission to provide you with exceptional heart care, we have created designated Provider Care Teams.  These Care Teams include your primary Cardiologist (physician) and Advanced Practice Providers (APPs -  Physician Assistants and Nurse Practitioners) who all work together to provide you with the care you need, when you need it.     Your next appointment:   6 month(s)  The format for your next appointment:   In Person  Provider:   Glenetta Hew, MD

## 2020-05-05 ENCOUNTER — Ambulatory Visit (INDEPENDENT_AMBULATORY_CARE_PROVIDER_SITE_OTHER): Payer: PPO | Admitting: Internal Medicine

## 2020-05-05 ENCOUNTER — Other Ambulatory Visit: Payer: Self-pay

## 2020-05-05 VITALS — BP 118/78 | HR 78 | Temp 97.5°F | Ht 70.0 in | Wt 181.0 lb

## 2020-05-05 DIAGNOSIS — Z125 Encounter for screening for malignant neoplasm of prostate: Secondary | ICD-10-CM | POA: Diagnosis not present

## 2020-05-05 DIAGNOSIS — L308 Other specified dermatitis: Secondary | ICD-10-CM

## 2020-05-05 DIAGNOSIS — K2100 Gastro-esophageal reflux disease with esophagitis, without bleeding: Secondary | ICD-10-CM

## 2020-05-05 DIAGNOSIS — Z1211 Encounter for screening for malignant neoplasm of colon: Secondary | ICD-10-CM | POA: Diagnosis not present

## 2020-05-05 DIAGNOSIS — I25119 Atherosclerotic heart disease of native coronary artery with unspecified angina pectoris: Secondary | ICD-10-CM

## 2020-05-05 DIAGNOSIS — I5042 Chronic combined systolic (congestive) and diastolic (congestive) heart failure: Secondary | ICD-10-CM

## 2020-05-05 DIAGNOSIS — J439 Emphysema, unspecified: Secondary | ICD-10-CM

## 2020-05-05 DIAGNOSIS — Z23 Encounter for immunization: Secondary | ICD-10-CM

## 2020-05-05 DIAGNOSIS — I10 Essential (primary) hypertension: Secondary | ICD-10-CM

## 2020-05-05 DIAGNOSIS — I2089 Other forms of angina pectoris: Secondary | ICD-10-CM

## 2020-05-05 DIAGNOSIS — E118 Type 2 diabetes mellitus with unspecified complications: Secondary | ICD-10-CM | POA: Diagnosis not present

## 2020-05-05 DIAGNOSIS — E781 Pure hyperglyceridemia: Secondary | ICD-10-CM

## 2020-05-05 DIAGNOSIS — G4733 Obstructive sleep apnea (adult) (pediatric): Secondary | ICD-10-CM

## 2020-05-05 DIAGNOSIS — I208 Other forms of angina pectoris: Secondary | ICD-10-CM | POA: Diagnosis not present

## 2020-05-05 DIAGNOSIS — Z0000 Encounter for general adult medical examination without abnormal findings: Secondary | ICD-10-CM

## 2020-05-05 LAB — LIPID PANEL
Cholesterol: 117 mg/dL (ref 0–200)
HDL: 36.4 mg/dL — ABNORMAL LOW (ref >39.00)
LDL Cholesterol: 62 mg/dL (ref 0–99)
NonHDL: 80.5
Total CHOL/HDL Ratio: 3
Triglycerides: 91 mg/dL (ref 0.0–149.0)
VLDL: 18.2 mg/dL (ref 0.0–40.0)

## 2020-05-05 LAB — COMPREHENSIVE METABOLIC PANEL
ALT: 20 U/L (ref 0–53)
AST: 19 U/L (ref 0–37)
Albumin: 4.3 g/dL (ref 3.5–5.2)
Alkaline Phosphatase: 99 U/L (ref 39–117)
BUN: 13 mg/dL (ref 6–23)
CO2: 25 mEq/L (ref 19–32)
Calcium: 9.6 mg/dL (ref 8.4–10.5)
Chloride: 106 mEq/L (ref 96–112)
Creatinine, Ser: 0.85 mg/dL (ref 0.40–1.50)
GFR: 94.8 mL/min (ref >60.00)
Glucose, Bld: 112 mg/dL — ABNORMAL HIGH (ref 70–99)
Potassium: 4.4 mEq/L (ref 3.5–5.1)
Sodium: 140 mEq/L (ref 135–145)
Total Bilirubin: 0.5 mg/dL (ref 0.2–1.2)
Total Protein: 6.9 g/dL (ref 6.0–8.3)

## 2020-05-05 LAB — CBC
HCT: 49.2 % (ref 39.0–52.0)
Hemoglobin: 16.6 g/dL (ref 13.0–17.0)
MCHC: 33.6 g/dL (ref 30.0–36.0)
MCV: 86.2 fl (ref 78.0–100.0)
Platelets: 355 10*3/uL (ref 150.0–400.0)
RBC: 5.71 Mil/uL (ref 4.22–5.81)
RDW: 15.1 % (ref 11.5–15.5)
WBC: 12.7 10*3/uL — ABNORMAL HIGH (ref 4.0–10.5)

## 2020-05-05 LAB — PSA, MEDICARE: PSA: 0.33 ng/ml (ref 0.10–4.00)

## 2020-05-05 LAB — HEMOGLOBIN A1C: Hgb A1c MFr Bld: 6.3 % (ref 4.6–6.5)

## 2020-05-05 NOTE — Progress Notes (Unsigned)
HPI:  Patient presents the clinic today for his subsequent annual Medicare wellness exam.  He is also due to follow-up chronic conditions.  CHF: Compensated, managed on Metoprolol.  Echo from 03/2015 reviewed.  HTN: His BP today is 118/78.  He is taking Metoprolol as prescribed.  ECG from 9/20: Reviewed.  HLD with CAD with angina, status post MI.  Status post stents.  His last LDL was 73, triglycerides were 214, 9 point/10.Marland Kitchen  He is taking Rosuvastatin, Fenofibrate, Metoprolol, Effient and Aspirin as prescribed.  He follows with cardiology.  OSA: He is not wearing a CPAP.  Sleep study from 04/2013 reviewed.  GERD: He denies breakthrough on Omeprazole.  Upper GI from 03/2015 reviewed.  COPD: He denies current cough but has mild shortness of breath.  He is taking Albuterol.  PFTs from 04/2013 reviewed.  DM 2: His last A1c was 6.7 %, 06/2018.  He does not monitor blood sugars.  He is not taking an oral diabetic medication at this time.  Past Medical History:  Diagnosis Date  . Bronchitis    Gets bronchitis almost every year  . CAD S/P percutaneous coronary angioplasty 06/2011; 6/ & 11/2013   S/P PCI to all 3 major vessels;a)  Ant STEMI 2001- BMS-LAD x2 -->(redo PCI 6/'15 -- pLAD Xience DES 2.5 x 12- 2.75 mm, mLAD 2.25 x 12 - 2.7 mm), b) '06 UA --> Cx- OM DES; c) 2013 Inf MI BMS mRCA --> d) 10/'15 PCI dRCA Promus P DES 4.0 x 16 (4.25 mm); PTCA of RPL2 (2.0 mm) &RPDA (2.25 mm) 11/2013; 4/18 PCI OM1 Synergy DES  2.5 x 16  . COPD (chronic obstructive pulmonary disease) (HCC)   . Elevated WBC count   . Emphysema of lung (HCC)   . Essential hypertension 07/07/2011  . Former heavy tobacco smoker     quit in May 2015 after multiple attempts at trying to quit before   . GERD (gastroesophageal reflux disease)   . Glucose intolerance (pre-diabetes) June 2015    hemoglobin A1c 6.6  . Headache    "Imdur related; stopped taking it; headaches went away" (11/15/2013)  . Heart murmur   . History of colon  polyps   . History of stomach ulcers   . Hyperlipidemia with target LDL less than 70 07/07/2011  . Metabolic syndrome    Pre-diabetes, hypertension and truncal obesity as well as dyslipidemia  . OSA on CPAP   . Pneumonia "several times"  . Recurrent upper respiratory infection (URI)   . Seizures (HCC) "several"   "last one was ~ 2011" (11/15/2013)  . Shortness of breath dyspnea   . ST elevation myocardial infarction (STEMI) of anterior wall (HCC) 2001   History of -- 2 stents in early and distal mid LAD; prior cardiologist was Dr. Derwood Kaplan in [place]  . ST elevation myocardial infarction (STEMI) of inferior wall (HCC) 07/07/2011   Occluded RCA 2.75X18 INTEGRITY; Echo 07/2013: EF 45-50%, Inferior & Posterolateral HK.   Marland Kitchen Umbilical hernia   . Unstable angina (HCC) 2006; 07/2013   Cx-OM - PCI 2.5 mm 13 mm Cypher DES Jinny Blossom, [place]) ; 07/2013: Severe ISR of both prox & Distal LAD stentS --> 2 DES stents (1 at prox edge of the proximal stent, 2nd covers the entire distal stent as well as proximal and distal edge stenose)   . Upper respiratory infection       Allergies  Allergen Reactions  . Niacin And Related Hives and Itching    Family History  Problem Relation Age of Onset  . Coronary artery disease Father   . Heart disease Father   . Stroke Father   . Hypertension Father   . Hyperlipidemia Mother   . Diabetes Mother   . Hyperlipidemia Maternal Grandmother   . Diabetes Maternal Grandmother   . Hyperlipidemia Maternal Grandfather   . Diabetes Maternal Grandfather   . Emphysema Maternal Grandfather   . Heart disease Paternal Grandmother   . Heart disease Paternal Grandfather   . Cancer Maternal Aunt        breast    Social History   Socioeconomic History  . Marital status: Married    Spouse name: Not on file  . Number of children: Not on file  . Years of education: Not on file  . Highest education level: Not on file  Occupational History  . Not on file   Tobacco Use  . Smoking status: Current Every Day Smoker    Packs/day: 0.50    Years: 40.00    Pack years: 20.00    Types: Cigarettes    Last attempt to quit: 01/19/2017    Years since quitting: 3.2  . Smokeless tobacco: Never Used  . Tobacco comment: Quit with Bupropion 169m - > unfortunately, he restarted  Substance and Sexual Activity  . Alcohol use: Yes    Comment: rare  . Drug use: No  . Sexual activity: Not on file  Other Topics Concern  . Not on file  Social History Narrative   He is a married father of 855 grandfather of 828 He does not really get routine exercise.    He is down to 5 or 6 cigarettes a day. He is very seriously wanting to quit. This is a significant cutback for him, but he has pretty much been told he needs to stop by his wife, and so he fully intends to do so. He is willing to try the patches or whatever. He has a social alcoholic beverage every now and then.    Social Determinants of Health   Financial Resource Strain: Not on file  Food Insecurity: Not on file  Transportation Needs: Not on file  Physical Activity: Not on file  Stress: Not on file  Social Connections: Not on file  Intimate Partner Violence: Not on file    Hospitiliaztions:  Health Maintenance:    Flu: 12/2019  Tetanus: 02/2015  Pneumovax: 2021  Prevnar: 11/2014  Zostavax: never  Shingrix: never  PSA: 06/2018  Colon Screening: 2017  Eye Doctor: annually  Dental Exam: as needed   Providers:   PCP: RWebb Silversmith NP   Cardiology: Dr. HEllyn Hack  I have personally reviewed and have noted:  1. The patient's medical and social history 2. Their use of alcohol, tobacco or illicit drugs 3. Their current medications and supplements 4. The patient's functional ability including ADL's, fall risks, home safety risks and hearing or visual impairment. 5. Diet and physical activities 6. Evidence for depression or mood disorder  Subjective:   Review of Systems:   Constitutional:  Denies fever, malaise, fatigue, headache or abrupt weight changes.  HEENT: Pt reports wax buildup. Denies eye pain, eye redness, ear pain, ringing in the ears, runny nose, nasal congestion, bloody nose, or sore throat. Respiratory: Pt reports intermittent shortness of breath. Denies difficulty breathing,  cough or sputum production.   Cardiovascular: Denies chest pain, chest tightness, palpitations or swelling in the hands or feet.  Gastrointestinal: Pt reports burping. Denies abdominal pain, bloating, constipation, diarrhea  or blood in the stool.  GU: Denies urgency, frequency, pain with urination, burning sensation, blood in urine, odor or discharge. Musculoskeletal: {t reports left side pain. Denies decrease in range of motion, difficulty with gait, or joint pain and swelling.  Skin: Pt repots dry skin. Denies redness, rashes, lesions or ulcercations.  Neurological: Denies dizziness, difficulty with memory, difficulty with speech or problems with balance and coordination.  Psych: Denies anxiety, depression, SI/HI.  No other specific complaints in a complete review of systems (except as listed in HPI above).  Objective:  PE:   BP 118/78   Pulse 78   Temp (!) 97.5 F (36.4 C) (Temporal)   Ht _0  (1.778 m)   Wt 181 lb (82.1 kg)   SpO2 95%   BMI 25.97 kg/m   Wt Readings from Last 3 Encounters:  05/01/20 181 lb (82.1 kg)  11/05/19 185 lb (83.9 kg)  09/25/19 186 lb 8 oz (84.6 kg)    General: Appears his stated age, well developed, well nourished in NAD. Skin: Warm, dry and intact. No ulcerations noted. Excessive dry skin noted on forearms. HEENT: Head: normal shape and size; Eyes: sclera white, no icterus, conjunctiva pink, PERRLA and EOMs intact; Ears: bilateral cerumen impaction;  Neck: Neck supple, trachea midline. No masses, lumps or thyromegaly present.  Cardiovascular: Normal rate and rhythm. S1,S2 noted.  No murmur, rubs or gallops noted. No JVD or BLE edema. No carotid  bruits noted. Pulmonary/Chest: Normal effort and positive vesicular breath sounds. No respiratory distress. No wheezes, rales or ronchi noted.  Abdomen: Soft and nontender. Normal bowel sounds. No distention or masses noted. Liver, spleen and kidneys non palpable. Musculoskeletal: Strength 5/5 BUE/BLE. No signs of joint swelling.  Neurological: Alert and oriented. Cranial nerves II-XII grossly intact. Coordination normal.  Psychiatric: Mood and affect normal. Behavior is normal. Judgment and thought content normal.     BMET    Component Value Date/Time   NA 139 11/15/2019 1010   NA 139 10/24/2013 1050   K 4.9 11/15/2019 1010   K 3.8 10/24/2013 1050   CL 103 11/15/2019 1010   CL 107 10/24/2013 1050   CO2 19 (L) 11/15/2019 1010   CO2 24 10/24/2013 1050   GLUCOSE 154 (H) 11/15/2019 1010   GLUCOSE 138 (H) 07/05/2018 1017   GLUCOSE 108 (H) 10/24/2013 1050   BUN 16 11/15/2019 1010   BUN 8 10/24/2013 1050   CREATININE 1.14 11/15/2019 1010   CREATININE 1.01 05/13/2016 1044   CALCIUM 9.5 11/15/2019 1010   CALCIUM 8.2 (L) 10/24/2013 1050   GFRNONAA 70 11/15/2019 1010   GFRNONAA >60 10/24/2013 1050   GFRAA 81 11/15/2019 1010   GFRAA >60 10/24/2013 1050    Lipid Panel     Component Value Date/Time   CHOL 141 11/15/2019 1010   TRIG 214 (H) 11/15/2019 1010   HDL 32 (L) 11/15/2019 1010   CHOLHDL 4.4 11/15/2019 1010   CHOLHDL 4 07/05/2018 1017   VLDL 69.2 (H) 07/05/2018 1017   LDLCALC 73 11/15/2019 1010    CBC    Component Value Date/Time   WBC 13.1 (H) 11/15/2019 1010   WBC 14.1 (H) 07/05/2018 1017   RBC 5.90 (H) 11/15/2019 1010   RBC 5.49 07/05/2018 1017   HGB 17.7 11/15/2019 1010   HCT 51.9 (H) 11/15/2019 1010   PLT 391 11/15/2019 1010   MCV 88 11/15/2019 1010   MCV 89 10/24/2013 1050   MCH 30.0 11/15/2019 1010   MCH 30.4 05/13/2016 1044  MCHC 34.1 11/15/2019 1010   MCHC 34.7 07/05/2018 1017   RDW 13.0 11/15/2019 1010   RDW 14.5 10/24/2013 1050   LYMPHSABS 4.6 (H)  04/04/2015 1040   LYMPHSABS 2.8 11/07/2013 1509   MONOABS 0.9 04/04/2015 1040   EOSABS 0.1 04/04/2015 1040   EOSABS 0.2 11/07/2013 1509   BASOSABS 0.1 04/04/2015 1040   BASOSABS 0.1 11/07/2013 1509    Hgb A1C Lab Results  Component Value Date   HGBA1C 6.7 (H) 07/05/2018      Assessment and Plan:   Medicare Annual Wellness Visit:  Diet: He does eat some meat. He consumes fruits and veggies. He tries to avoid fried foods. He drinks mostly water. Physical activity: Walking Depression/mood screen: Negative, PHQ 9 score of 0 Hearing: Intact to whispered voice Visual acuity: Grossly normal, performs annual eye exam  ADLs: Capable Fall risk: None Home safety: Good Cognitive evaluation: Intact to orientation, naming, recall and repetition EOL planning: Adv directives, full code/ I agree  Preventative Medicine: Flu shot UTD.  Tetanus UTD.  Pneumovax UTD.  Prevnar today.  Encouraged him to go to the pharmacy consider shingles vaccine.  Referral to GI for screening colonoscopy.  Encouraged him to consume a balanced diet and exercise regimen.  Advised him to see an eye doctor and dentist annually.  Will check CBC, c-Met, lipid, A1c and PSA today.  Due dates for screening exam given the patient's part of his ADLs.  Dry Skin Dermatitis:  Failed steroid cream and lotions Referral to dermatology for further evaluation.  Next appointment: 6 months, follow-up chronic conditions.   Webb Silversmith, NP This visit occurred during the SARS-CoV-2 public health emergency.  Safety protocols were in place, including screening questions prior to the visit, additional usage of staff PPE, and extensive cleaning of exam room while observing appropriate contact time as indicated for disinfecting solutions.

## 2020-05-07 ENCOUNTER — Encounter: Payer: Self-pay | Admitting: Internal Medicine

## 2020-05-07 NOTE — Assessment & Plan Note (Signed)
C-Met and lipid profile today Encouraged him to consume a low-fat diet Continue rosuvastatin and fenofibrate

## 2020-05-07 NOTE — Assessment & Plan Note (Signed)
A1c today Encouraged him to consume a low carb diet Encourage routine eye exams Encourage routine foot exams Flu shot UTD Pneumovax UTD Prevnar today COVID UTD

## 2020-05-07 NOTE — Patient Instructions (Signed)

## 2020-05-07 NOTE — Assessment & Plan Note (Signed)
Encourage smoking cessation Continue albuterol as needed 

## 2020-05-07 NOTE — Assessment & Plan Note (Signed)
Controlled on metoprolol Reinforced DASH diet C-Met today

## 2020-05-07 NOTE — Assessment & Plan Note (Signed)
Currently not wearing CPAP We will monitor

## 2020-05-07 NOTE — Assessment & Plan Note (Signed)
Compensated Continue Metoprolol He will continue to follow with cardiology

## 2020-05-07 NOTE — Assessment & Plan Note (Signed)
No recent angina Has nitro as needed

## 2020-05-07 NOTE — Assessment & Plan Note (Signed)
No recent angina Continue rosuvastatin, fenofibrate, metoprolol, Effient and aspirin CBC, c-Met and lipid profile today Encouraged him to consume a low-fat diet We will continue to follow cardiology

## 2020-05-07 NOTE — Assessment & Plan Note (Signed)
Avoid foods that trigger reflux Continue omeprazole CBC and c-Met today

## 2020-05-14 ENCOUNTER — Encounter: Payer: Self-pay | Admitting: Internal Medicine

## 2020-05-20 ENCOUNTER — Encounter: Payer: PPO | Admitting: Internal Medicine

## 2020-05-22 IMAGING — CT CT ABDOMEN WITH CONTRAST
2 of 5 series · 16 of 46 positions shown, 18 images · IV contrast (omnipaque)
Comparison: None.

CLINICAL DATA: Left-sided abdominal pain for 4 months.

EXAM:
CT ABDOMEN WITH CONTRAST
TECHNIQUE: Multidetector CT imaging of the abdomen was performed using the
standard protocol following bolus administration of intravenous
contrast.
CONTRAST:  100mL OMNIPAQUE IOHEXOL 300 MG/ML  SOLN

[Series 2: abd pelvis · axial · 0.81mm/px · z∈[-1360,-1120]mm · 13 of 56 slices shown, 15 images]
[im 4/56  soft-tissue]
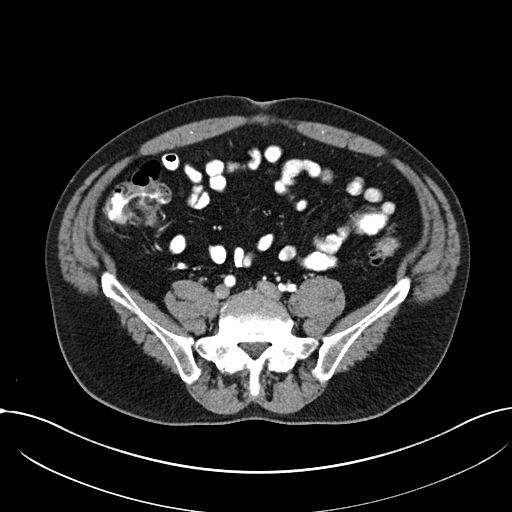
[im 4/56  bone]
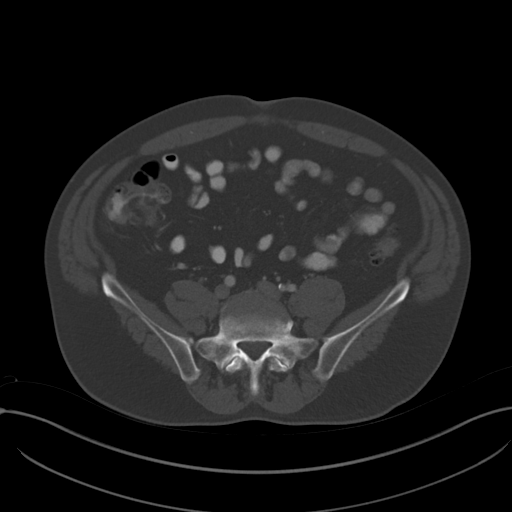
[im 7/56  soft-tissue]
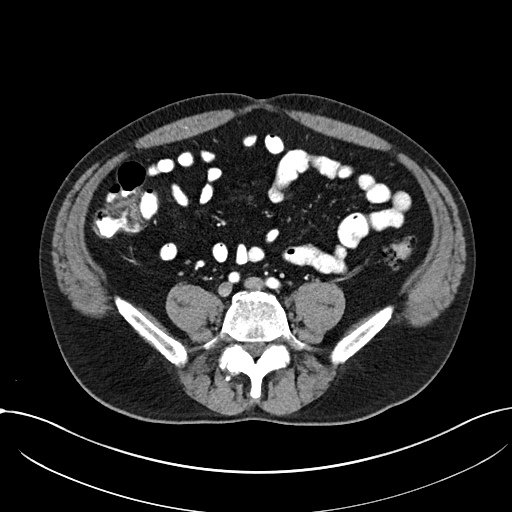
[im 11/56  soft-tissue]
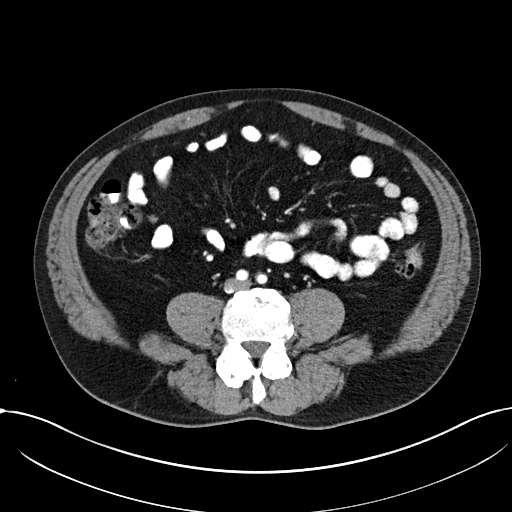
[im 18/56  soft-tissue]
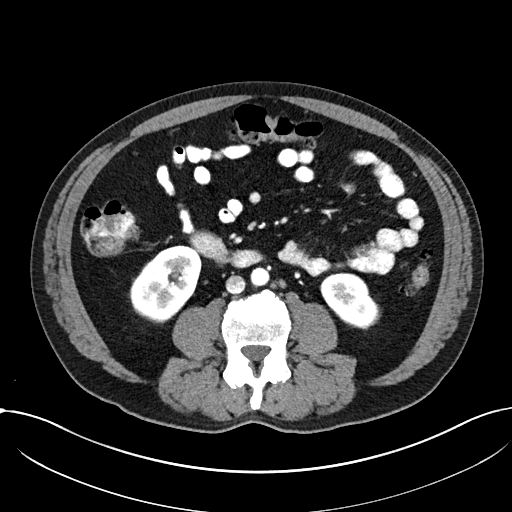
[im 21/56  soft-tissue]
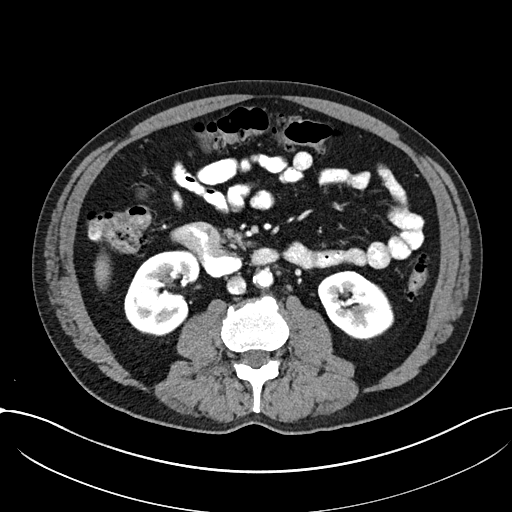
[im 25/56  soft-tissue]
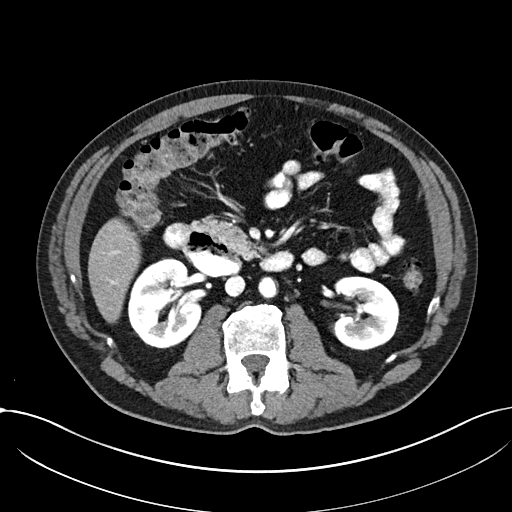
[im 28/56  soft-tissue]
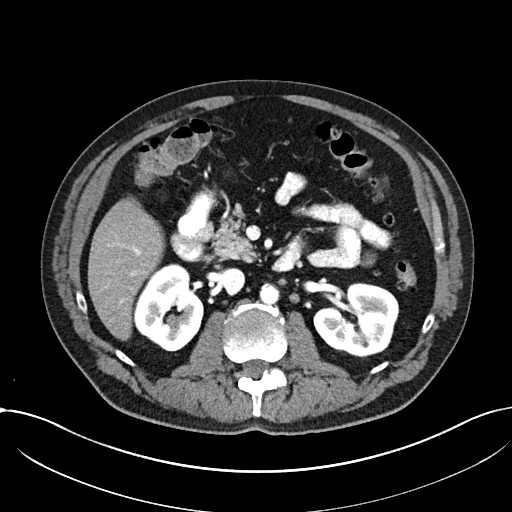
[im 31/56  soft-tissue]
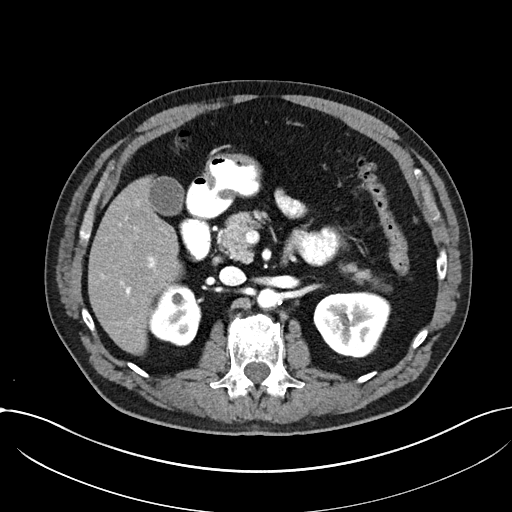
[im 35/56  soft-tissue]
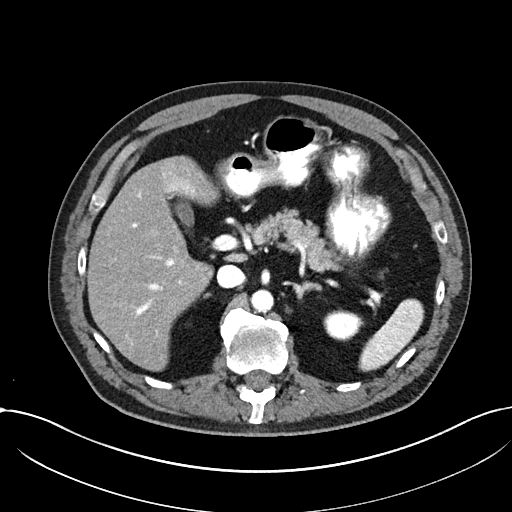
[im 35/56  bone]
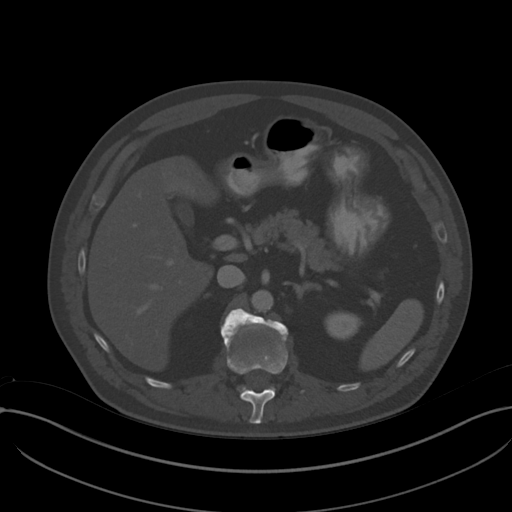
[im 38/56  soft-tissue]
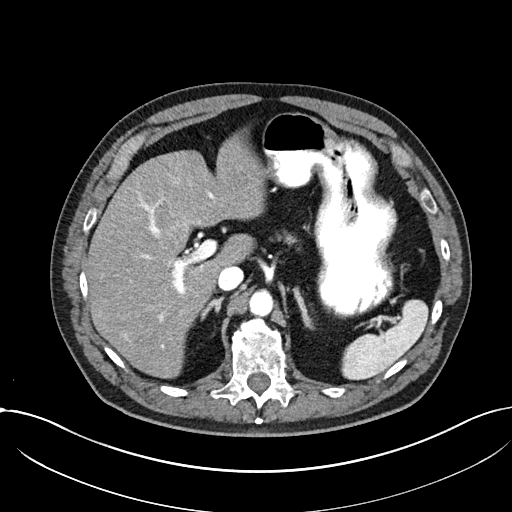
[im 45/56  soft-tissue]
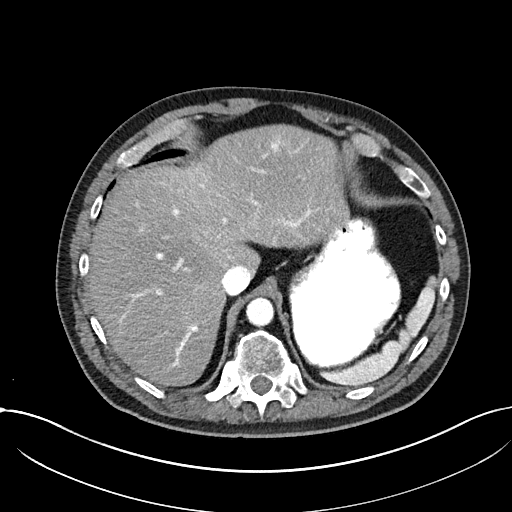
[im 49/56  soft-tissue]
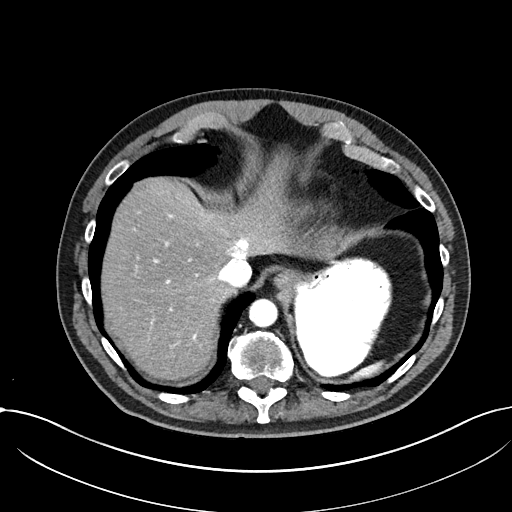
[im 52/56  soft-tissue]
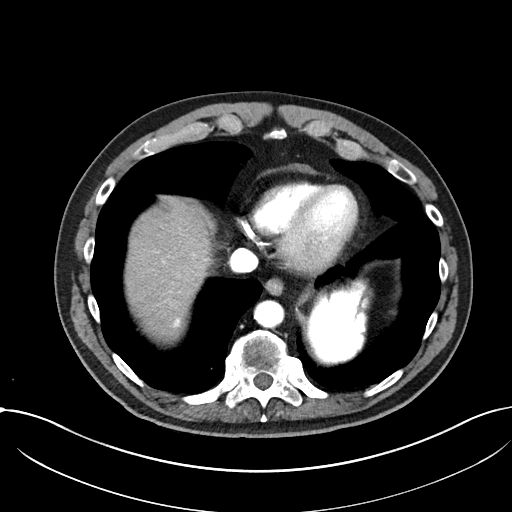

[Series 4: coronal abd pelvis · coronal · 0.56mm/px · 3 of 147 slices shown]
[im 49/147  soft-tissue]
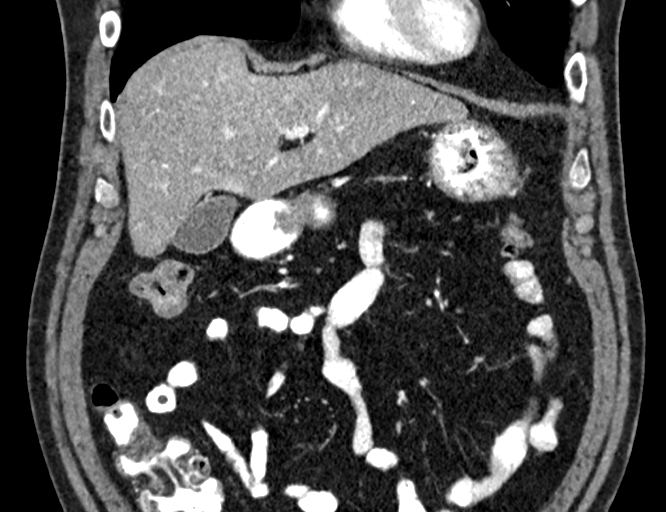
[im 65/147  soft-tissue]
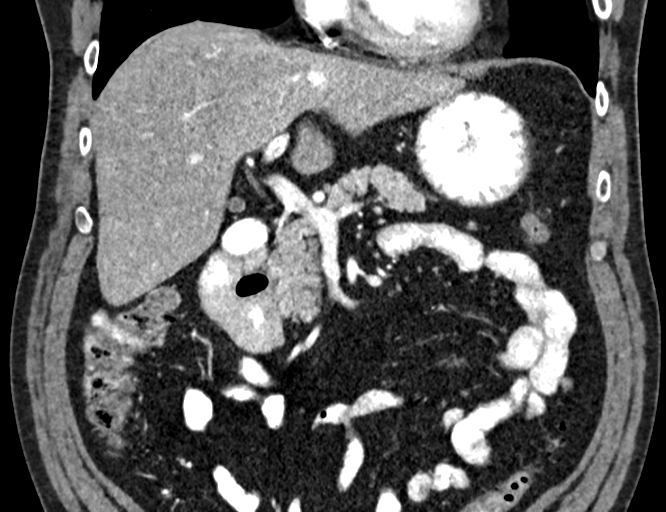
[im 82/147  soft-tissue]
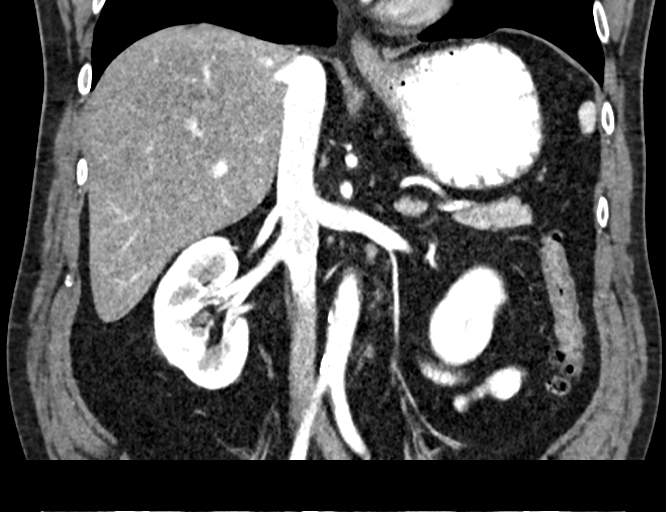

[16 of 46 positions shown; findings below may reference images not displayed]

FINDINGS: Lower chest: No acute findings.

Hepatobiliary: Moderate diffuse hepatic steatosis. A 9 mm
hypervascular lesion is seen in segment 2 of the left lobe, likely
representing a benign hemangioma or focal nodular hyperplasia. No
other liver masses are identified. Gallbladder is unremarkable. No
evidence of biliary ductal dilatation.

Pancreas:  No mass or inflammatory changes.

Spleen:  Within normal limits in size and appearance.

Adrenals/Urinary Tract: No masses identified. A few tiny renal cysts
are noted. No evidence of hydronephrosis.

Stomach/Bowel: Diverticulosis is seen involving the visualized
portion of the transverse and descending colon, however there is no
evidence of diverticulitis in these regions. Note that the pelvis
and rectosigmoid colon was not scanned as part of this exam.

Vascular/Lymphatic: No pathologically enlarged lymph nodes
identified. No abdominal aortic aneurysm. Aortic atherosclerosis.

Other:  None.

Musculoskeletal:  No suspicious bone lesions identified.
IMPRESSION: 1. Colonic diverticulosis, without radiographic evidence of
diverticulitis involving the visualized transverse or descending
colon. Note that the pelvis/rectosigmoid colon with not scanned as
part of this exam.
2. Moderate hepatic steatosis.
3. 9 mm hypervascular lesion in left lobe of liver, likely
representing a benign hemangioma or focal nodular hyperplasia.
Consider abdomen MRI without and with contrast for further
characterization.

Aortic Atherosclerosis ([id number]).

## 2020-05-23 NOTE — Progress Notes (Incomplete)
Primary Care Provider: Jearld Fenton, NP Cardiologist: Glenetta Hew, MD Electrophysiologist: None  Clinic Note: No chief complaint on file.  ===================================  ASSESSMENT/PLAN   Problem List Items Addressed This Visit   None    ===================================  HPI:    Troy Parrish. is a 60 y.o. male with extensive CAD history reviewed below who presents today for 69-monthfollow-up.  He has a very significant multivessel coronary disease history with at least 3 STEMI's and at least 3 if not 4 episodes of progressive /unstable angina leading to additional PCI (now all 3 major arteries s/p PCI). He also has very difficult/poorly controlled lipids. He intermittently goes back to smoking, but is now in the process of trying to complete his quitting.  Has had blood pressure issues with adding ARB or amlodipine, therefore we changed to diltiazem --> doing much better since converting from amlodipine to diltiazem.  Less fatigue being off of the high-dose beta-blocker. Palpitations improved.   Unable to afford Ranexa in the past   Most recent cardiac catheterization was in January 2020 (reviewed below) showing stable coronary disease with patent stents.  Only mild progression of disease in the very proximal LAD but not physiologically significant.  Biggest issue is working on smoking cessation.   RCheyenne Schumm was last seen on November 05, 2019 --> he was in great spirits.  Feeling well.  Doing well.  Blood pressures been stable.  He was feeling healthy.  Was only upset of the fact that he had to stop using Chantix because of  what he felt was an insurance issue, but was actually that Chantix was discontinued.  He was in the final stages of trying to quit smoking -> he was down about a quarter a pack a day.  He was getting ready to celebrate his 227thwedding anniversary, planning a big trip with his wife along with several other trips. [facility], they went to several Concerts in September.  No chest pain or pressure.  Just off-and-on palpitations.  Only some dyspnea exertion related to smoking, but much better.  Allergy cleared up.  Recent Hospitalizations: ***   Reviewed  CV studies:    The following studies were reviewed today: (if available, images/films reviewed: From Epic Chart or Care Everywhere) . ***:   Interval History:   RNicola Quesnell   CV Review of Symptoms (Summary) Cardiovascular ROS: {roscv:310661}  The patient {does/does not:200015} have symptoms concerning for COVID-19 infection (fever, chills, cough, or new shortness of breath).   REVIEWED OF SYSTEMS   ROS  HENT: Positive for congestion (Associated allergies.). Negative for nosebleeds.   Respiratory: Positive for cough (Daily cough-smoker's cough). Negative for sputum production and wheezing (No active wheezing.).   Cardiovascular: Negative for claudication.  Gastrointestinal: Positive for heartburn. Negative for blood in stool and melena.  Genitourinary: Negative for frequency and hematuria.  Musculoskeletal: Negative for joint pain.  Neurological: Negative for dizziness, focal weakness and headaches.  Endo/Heme/Allergies: Positive for environmental allergies (Not as bad this year).  Psychiatric/Behavioral: Negative for depression (Much less symptoms now) and memory loss. The patient does not have insomnia.    I have reviewed and (if needed) personally updated the patient's problem list, medications, allergies, past medical and surgical history, social and family history.   PAST MEDICAL HISTORY   Past Medical History:  Diagnosis Date  . Bronchitis    Gets bronchitis almost every year  .  CAD S/P percutaneous coronary angioplasty 06/2011; 6/ & 11/2013   S/P PCI to all 3 major vessels;a)  Ant STEMI 2001- BMS-LAD x2 -->(redo PCI 6/'15 -- pLAD Xience DES 2.5 x 12- 2.75 mm, mLAD 2.25 x 12 - 2.7 mm), b) '06 UA  --> Cx- OM DES; c) 2013 Inf MI BMS mRCA --> d) 10/'15 PCI dRCA Promus P DES 4.0 x 16 (4.25 mm); PTCA of RPL2 (2.0 mm) &RPDA (2.25 mm) 11/2013; 4/18 PCI OM1 Synergy DES  2.5 x 16  . COPD (chronic obstructive pulmonary disease) ([place])   . Elevated WBC count   . Emphysema of lung ([place])   . Essential hypertension 07/07/2011  . Former heavy tobacco smoker     quit in May 2015 after multiple attempts at trying to quit before   . GERD (gastroesophageal reflux disease)   . Glucose intolerance (pre-diabetes) June 2015    hemoglobin A1c 6.6  . Headache    "Imdur related; stopped taking it; headaches went away" (11/15/2013)  . Heart murmur   . History of colon polyps   . History of stomach ulcers   . Hyperlipidemia with target LDL less than 70 07/07/2011  . Metabolic syndrome    Pre-diabetes, hypertension and truncal obesity as well as dyslipidemia  . OSA on CPAP   . Pneumonia "several times"  . Recurrent upper respiratory infection (URI)   . Seizures ([place]) "several"   "last one was ~ 2011" (11/15/2013)  . Shortness of breath dyspnea   . ST elevation myocardial infarction (STEMI) of anterior wall ([place]) 2001   History of -- 2 stents in early and distal mid LAD; prior cardiologist was Dr. Remi Haggard in [place]  . ST elevation myocardial infarction (STEMI) of inferior wall ([place]) 07/07/2011   Occluded RCA 2.99M42 INTEGRITY; Echo 07/2013: EF 45-50%, Inferior & Posterolateral HK.   Marland Kitchen Umbilical hernia   . Unstable angina ([place]) 2006; 07/2013   Cx-OM - PCI 2.5 mm 13 mm Cypher DES [name], [place]) ; 07/2013: Severe ISR of both prox & Distal LAD stentS --> 2 DES stents (1 at prox edge of the proximal stent, 2nd covers the entire distal stent as well as proximal and distal edge stenose)   . Upper respiratory infection     PAST SURGICAL HISTORY   Past Surgical History:  Procedure Laterality Date  . Cardiac MRI [facility]  09/2014   EF 51%. Mod HK of basal-mid Inf wall, mild HK of basal inferoseptum &  basal-mid inferolateral wall with hyper enhancement (c/w subendocard RCA MI with significant viability), focal hyper enhancement of apical wall c/w dLAD subendocard MI & complete LAD viability. . No aortic stenosis. Normal RV function. No Ischemia on Adenosine Stress.  . COLONOSCOPY WITH PROPOFOL N/A 03/28/2015   Procedure: COLONOSCOPY WITH PROPOFOL;  Surgeon: Lucilla Lame, MD;  Location: [place];  Service: Endoscopy;  Laterality: N/A;  . CORONARY ANGIOPLASTY WITH STENT PLACEMENT  2001   ANTERIOR mi with a  PCI to LAD; [place], [place] - Dr. Dorris Fetch  . CORONARY ANGIOPLASTY WITH STENT PLACEMENT  2006   [place] by Dr Dorris Fetch -lesion lft circ/OM1 with 2.5 x 64m Cypher DES  . CORONARY STENT INTERVENTION N/A 05/19/2016   Procedure: Coronary Stent Intervention;  Surgeon: DLeonie Man MD;  [place];  Service: Cardiovascular: PCI pOM1 - Synergy DES 2.5 x 16 (overlaps old stent)  . ESOPHAGOGASTRODUODENOSCOPY (EGD) WITH PROPOFOL N/A 03/28/2015   Procedure: ESOPHAGOGASTRODUODENOSCOPY (EGD) WITH PROPOFOL;  Surgeon: DLucilla Lame  MD;  Location: [place];  Service: Endoscopy;  Laterality: N/A;  . INSERTION OF MESH N/A 04/02/2015   Procedure: INSERTION OF MESH;  Surgeon: Florene Glen, MD;  Location: [facility];  Service: General;  Laterality: N/A;  . LEFT AND RIGHT HEART CATHETERIZATION WITH CORONARY ANGIOGRAM N/A 07/30/2013   Procedure: LEFT AND RIGHT HEART CATHETERIZATION WITH CORONARY ANGIOGRAM;  Surgeon: Leonie Man, MD;  Location: [facility] CATH LAB;  Service: Cardiovascular;  2 separate P & mLAD lesions -> PCI, MOd-Severe dRCA disesase with diffuse RPL/PDA disease (FFR)  . LEFT HEART CATH  08/03/2013   Procedure: LEFT HEART CATH;  Surgeon: Leonie Man, MD;  Location: [facility] CATH LAB;  Service: Cardiovascular;;FFR of RCA - non-flow-limiting  . LEFT HEART CATH AND CORONARY ANGIOGRAPHY N/A 05/19/2016   Procedure: Left Heart Cath and Coronary Angiography;  Surgeon: Leonie Man, MD;  Location: [place] CV LAB;  Service: Cardiovascular: CULPRIT - 85% pOM1 pre-stent. Stents in both proximal and distal RCA widely patent. PTCA sites in RPL & PDA widely patent with stable 70% small RPL 2. Extensive stenting in the LAD widely patent stents with mild to mod Dz btw prox & mid-distal stents.   Marland Kitchen LEFT HEART CATH AND CORONARY ANGIOGRAPHY N/A 02/13/2018   Procedure: LEFT HEART CATH AND CORONARY ANGIOGRAPHY;  Surgeon: Leonie Man, MD;  Location: [place] CV LAB;  Service: Cardiovascular: Similar findings to last cath post PCI.  Stents are patent.  Roughly 50% proximal LAD lesion noted, may be progression from prior cath but otherwise stable disease.  Marland Kitchen LEFT HEART CATHETERIZATION WITH CORONARY ANGIOGRAM N/A 07/07/2011   Procedure: LEFT HEART CATHETERIZATION WITH CORONARY ANGIOGRAM;  Surgeon: Leonie Man, MD;  [facility] LAB;  Service: Cardiovascular;  inferolat post LV infarct ST-elevation MI  . MET/CPET  11/09/2011   submax. effort 1.05 RER peak V02 was 51% ,chronotropic incomp.hrt rate lows 80s to 100  . MET/CPET  September 2016   [facility]: Mild functional impairment due primarily to mild pulmonary and circulatory limitations. Also suggest physical deconditioning (seen with low-normal aerobic reserve). Mild VQ mismatch with exercise.  Ventilatory reserve exhausted with peak exercise demonstrated pulmonary limitation. No significant decrease in postexercise FEV1 compared to rest --> suggest no exercise induced bronchospasm  . NM MYOVIEW ([place] [facility])  11/30/2011   EF 45% ,exercise 10 METS, infarct\scar w mild perinfarct ischemia -basal inferolat and mid inferolat region  . NM MYOVIEW LTD  04/25/2016   Exercised for 9 minutes. Reached 90% max. Heart rate. 9.4 METS. Read as a low risk study with normal EF 55-65%. -> on Review -  there does appear to be a small to moderate sized, medium severtiy reversible perfusion defect in the anterior wall - read by computer, but not  noted by reader.  Marland Kitchen PERCUTANEOUS CORONARY STENT INTERVENTION (PCI-S)  07/07/2011   Procedure: PERCUTANEOUS CORONARY STENT INTERVENTION (PCI-S);  Surgeon: Leonie Man, MD;  Location: [facility] CATH LAB;  Service: Cardiovascular;;occluded RCA ,Integrity Resolute DES 2.75 x 18 mm stent - post-dilated 3.1 mm  . PERCUTANEOUS CORONARY STENT INTERVENTION (PCI-S)  07/30/2013   Procedure: PERCUTANEOUS CORONARY STENT INTERVENTION (PCI-S);  Surgeon: Leonie Man, MD;  Location: [facility] CATH LAB;  Service: Cardiovascular;;Distal mid LAD: Xience Alpine DES 2.25 mm x 28 mm (overlapping proximal and distal edge of previous stent) - 2.7 mm; proximal LAD 2.5 mm x 12 mm Xience Alpine DES (2.8 mm);; RCA 60-70% stenosis planned staged procedure  . PERCUTANEOUS CORONARY STENT  INTERVENTION (PCI-S) N/A 11/15/2013   Procedure: PERCUTANEOUS CORONARY STENT INTERVENTION (PCI-S);  Surgeon: Leonie Man, MD;  Location[facility] LAB;  Service: Cardiovascular;  Patent Cx & LAD Stents; PCI -dRCA Promus Premier DES 4.0 mm x 16 mm (4.25 mm) dRCA, 2.0 mm Cutting PTCA of RPL2 [place] & POBA of mRPDA  . POLYPECTOMY  03/28/2015   Procedure: POLYPECTOMY;  Surgeon: Lucilla Lame, MD;  Location: [place];  Service: Endoscopy;;  . RIGHT HEART CATH  June 2015   Normal pressures with severely reduced cardiac output and index. (3.25/1.55)  . TRANSTHORACIC ECHOCARDIOGRAM  07/31/2013; February 2017   a. LVEF 45-50. Inferior posterior hypokinesis with mild dilation. Apparent normal diastolic pressures;   . UMBILICAL HERNIA REPAIR N/A 04/02/2015   Procedure: HERNIA REPAIR UMBILICAL ADULT;  Surgeon: Florene Glen, MD;  Location: [facility];  Service: General;  Laterality: N/A;     05/2016; OM1 IOX:BDZHGDJ DES 2.5X16 -overlaps OLD stent. ? Cath January 6, 2020similar findings when compared to prior cath.    Immunization History  Administered Date(s) Administered  . Influenza Split 11/09/2012  . Influenza,inj,Quad PF,6+ Mos 11/16/2013,  11/29/2014, 11/11/2015, 11/10/2016, 12/01/2017, 12/07/2018, 12/26/2019  . Pneumococcal Conjugate-13 11/29/2014  . Pneumococcal Polysaccharide-23 07/11/2011, 11/10/2016, 01/02/2019  . Tdap 03/04/2015    MEDICATIONS/ALLERGIES   Current Meds  Medication Sig  . albuterol (VENTOLIN HFA) 108 (90 Base) MCG/ACT inhaler INHALE TWO PUFFS BY MOUTH EVERY 6 HOURS AS NEEDED FOR SHORTNESS OF BREATH  . clobetasol cream (TEMOVATE) 0.05 % APPLY TOPICALLY TO AFFECTED AREA(S) DAILY AS NEEDED FOR ECZEMA (Patient taking differently: Apply 1 application topically daily as needed (eczema).)  . fenofibrate (TRICOR) 48 MG tablet TAKE ONE TABLET BY MOUTH DAILY  . fluticasone (FLONASE) 50 MCG/ACT nasal spray Place 2 sprays into both nostrils daily. (Patient taking differently: Place 2 sprays into both nostrils daily as needed (congestion).)  . Fluticasone-Salmeterol (ADVAIR) 100-50 MCG/DOSE AEPB INHALE ONE PUFF BY MOUTH TWICE A DAY - IN THE MORNING AND AT BEDTIME ***MUST SCHEDULE PHYSICAL EXAM APPOINTMENT FOR MORE REFILLS***  . Melatonin 1 MG CAPS Take 1 mg by mouth at bedtime as needed (sleep).  . metoprolol succinate (TOPROL XL) 25 MG 24 hr tablet Take 1.5 tablets (37.5 mg total) by mouth every evening.  . nitroGLYCERIN (NITROSTAT) 0.4 MG SL tablet Place 1 tablet (0.4 mg total) under the tongue every 5 (five) minutes as needed for chest pain.  . Omega-3 Fatty Acids (FISH OIL) 1000 MG CPDR Take 3 g by mouth daily.  Marland Kitchen omeprazole (PRILOSEC) 20 MG capsule TAKE ONE CAPSULE BY MOUTH DAILY  . prasugrel (EFFIENT) 10 MG TABS tablet Take 1 tablet (10 mg total) by mouth daily.  . rosuvastatin (CRESTOR) 40 MG tablet TAKE ONE TABLET BY MOUTH DAILY  . [DISCONTINUED] diltiazem (CARDIZEM CD) 120 MG 24 hr capsule Take 1 capsule (120 mg total) by mouth every morning.    Allergies  Allergen Reactions  . Niacin And Related Hives and Itching    SOCIAL HISTORY/FAMILY HISTORY   Reviewed in Epic:  Pertinent findings:  Social  History   Tobacco Use  . Smoking status: Current Every Day Smoker    Packs/day: 0.50    Years: 40.00    Pack years: 20.00    Types: Cigarettes    Last attempt to quit: 01/19/2017    Years since quitting: 3.2  . Smokeless tobacco: Never Used  . Tobacco comment: Quit with Bupropion 132m - > unfortunately, he restarted  Substance Use Topics  .  Alcohol use: Yes    Comment: rare  . Drug use: No   Social History   Social History Narrative   He is a married father of 80, grandfather of 68. He does not really get routine exercise.    He is down to 5 or 6 cigarettes a day. He is very seriously wanting to quit. This is a significant cutback for him, but he has pretty much been told he needs to stop by his wife, and so he fully intends to do so. He is willing to try the patches or whatever. He has a social alcoholic beverage every now and then.     OBJCTIVE -PE, EKG, labs   Wt Readings from Last 3 Encounters:  05/01/20 181 lb (82.1 kg)  11/05/19 185 lb (83.9 kg)  09/25/19 186 lb 8 oz (84.6 kg)    Physical Exam: BP 117/75   Pulse 91   Ht _0  (1.676 m)   Wt 181 lb (82.1 kg)   SpO2 95%   BMI 29.21 kg/m  Physical Exam  Appearance: He is well-developed.     Comments: Relatively healthy-appearing.  Well-groomed.  HENT:     Head: Normocephalic and atraumatic.  Neck:     Vascular: Carotid bruit (Soft bilateral carotid bruits.) present. No JVD.    Adult ECG Report  Rate: *** ;  Rhythm: {rhythm:17366};   Narrative Interpretation: ***  Recent Labs:  ***  Lab Results  Component Value Date   CHOL 141 11/15/2019   HDL 32 (L) 11/15/2019   LDLCALC 73 11/15/2019   LDLDIRECT 53.0 07/05/2018   TRIG 214 (H) 11/15/2019   CHOLHDL 4.4 11/15/2019   Lab Results  Component Value Date   CREATININE 1.14 11/15/2019   BUN 16 11/15/2019   NA 139 11/15/2019   K 4.9 11/15/2019   CL 103 11/15/2019   CO2 19 (L) 11/15/2019   CBC Latest Ref Rng & Units 11/15/2019 07/05/2018 02/09/2018  WBC 3.4  - 10.8 x10E3/uL 13.1(H) 14.1(H) 11.7(H)  Hemoglobin 13.0 - 17.7 g/dL 17.7 17.0 16.1  Hematocrit 37.5 - 51.0 % 51.9(H) 48.9 48.5  Platelets 150 - 450 x10E3/uL 391 375.0 461(H)    Lab Results  Component Value Date   TSH 2.330 10/12/2016    ==================================================  COVID-19 Education: The signs and symptoms of COVID-19 were discussed with the patient and how to seek care for testing (follow up with PCP or arrange E-visit).   The importance of social distancing and COVID-19 vaccination was discussed today. The patient {ACTION; IS/IS [id number]} practicing social distancing & Masking.   I spent a total of ***minutes with the patient spent in direct patient consultation.  Additional time spent with chart review  / charting (studies, outside notes, etc): *** min Total Time: *** min   Current medicines are reviewed at length with the patient today.  (+/- concerns) ***  This visit occurred during the SARS-CoV-2 public health emergency.  Safety protocols were in place, including screening questions prior to the visit, additional usage of staff PPE, and extensive cleaning of exam room while observing appropriate contact time as indicated for disinfecting solutions.  Notice: This dictation was prepared with Dragon dictation along with smaller phrase technology. Any transcriptional errors that result from this process are unintentional and may not be corrected upon review.  Patient Instructions / Medication Changes & Studies & Tests Ordered   There are no Patient Instructions on file for this visit.   Studies Ordered:   No orders of the  defined types were placed in this encounter.    Glenetta Hew, M.D., M.S. Interventional Cardiologist   Pager # [phone] Phone # [phone] [address] [place], [place] [address]   Thank you for choosing Heartcare at [facility]!!

## 2020-05-24 ENCOUNTER — Encounter: Payer: Self-pay | Admitting: Cardiology

## 2020-05-24 NOTE — Assessment & Plan Note (Signed)
Stable BP on current meds. Has been on combination of Toprol and diltiazem, and the diltiazem has been discontinued.  Monitor closely, but I suspect that we may go back on diltiazem if angina recurs.

## 2020-05-24 NOTE — Assessment & Plan Note (Signed)
Borderline LVEF with no active heart failure symptoms. Last time I saw him he was on diltiazem, not currently on it.  Except hold with current dose of Toprol at 37.5 and no significant fatigue.  Has not tolerated ARB/ACE inhibitor in the past for afterload reduction, and has been relatively euvolemic not requiring diuretic.  Could consider the possibility of using chlorthalidone for blood pressure and diuresis if symptoms recur.

## 2020-05-24 NOTE — Assessment & Plan Note (Signed)
Doing well with no recent anginal symptoms.  In the last PCI was in 2018.  Has been doing well since then.  No longer on diltiazem-not sure how that got stopped, but for now he is doing okay so we will stay off of it.  Likely related to fatigue.  Plan: Continue current dose of Toprol along with Effient.  No aspirin. Continue rosuvastatin and fenofibrate.  Lipids look well controlled.  Continue to work on smoking cessation.

## 2020-05-24 NOTE — Assessment & Plan Note (Signed)
Insurance issues with CPAP.  Hopefully he can get back on it.

## 2020-05-24 NOTE — Assessment & Plan Note (Signed)
With extensive PCI to just but every vessel system, he is on lifelong Thienopyridine therapy.  Did not do well with Brilinta and Plavix.  He is on standing dose of prasugrel.  Okay to hold 7 days preop for surgeries (9 days for neurologic or spinal procedures)

## 2020-05-24 NOTE — Assessment & Plan Note (Signed)
Symptoms are little worse.  He is on omeprazole, increased dose of 40 mg daily.

## 2020-05-24 NOTE — Assessment & Plan Note (Signed)
Despite having anterior STEMI, he still has preserved EF.  We have done extensive PCI to the distal LAD as well.

## 2020-05-24 NOTE — Assessment & Plan Note (Signed)
Doing well.  No active symptoms.  Not on same dose of diuretic.  Only on beta-blocker.

## 2020-05-24 NOTE — Assessment & Plan Note (Signed)
Lipids recently checked look great.  Stable on rosuvastatin plus fenofibrate for hypertriglyceridemia.  Doing well.

## 2020-05-24 NOTE — Assessment & Plan Note (Signed)
Remains on fenofibrate plus statin.  Last triglycerides repeat well-controlled.

## 2020-05-24 NOTE — Assessment & Plan Note (Signed)
At least 2 STEMI's and multiple episodes of unstable angina/non-STEMI.  EF prewell-preserved indicating that most the STEMI's did not cause significant damage.  No recurrent angina or heart failure symptoms on stable regimen... He has had revascularization in all 3 major [place].

## 2020-05-28 ENCOUNTER — Encounter: Payer: Self-pay | Admitting: *Deleted

## 2020-06-02 ENCOUNTER — Other Ambulatory Visit: Payer: Self-pay | Admitting: Internal Medicine

## 2020-06-02 DIAGNOSIS — I5042 Chronic combined systolic (congestive) and diastolic (congestive) heart failure: Secondary | ICD-10-CM

## 2020-06-03 ENCOUNTER — Other Ambulatory Visit: Payer: Self-pay | Admitting: Internal Medicine

## 2020-06-03 DIAGNOSIS — I5042 Chronic combined systolic (congestive) and diastolic (congestive) heart failure: Secondary | ICD-10-CM

## 2020-06-03 NOTE — Telephone Encounter (Signed)
Pt called to request refill on  Albuterol Sulfate 108 (90 Base) MCG/ACT. Please send to [facility] on [address]

## 2020-06-04 ENCOUNTER — Telehealth: Payer: Self-pay

## 2020-06-06 ENCOUNTER — Other Ambulatory Visit: Payer: Self-pay | Admitting: Cardiology

## 2020-06-23 ENCOUNTER — Other Ambulatory Visit: Payer: Self-pay

## 2020-06-23 ENCOUNTER — Encounter: Payer: Self-pay | Admitting: Gastroenterology

## 2020-06-23 ENCOUNTER — Ambulatory Visit (INDEPENDENT_AMBULATORY_CARE_PROVIDER_SITE_OTHER): Payer: PPO | Admitting: Gastroenterology

## 2020-06-23 VITALS — BP 120/76 | HR 92 | Temp 97.5°F | Ht 70.0 in | Wt 180.2 lb

## 2020-06-23 DIAGNOSIS — R109 Unspecified abdominal pain: Secondary | ICD-10-CM | POA: Diagnosis not present

## 2020-06-23 DIAGNOSIS — R1013 Epigastric pain: Secondary | ICD-10-CM | POA: Diagnosis not present

## 2020-06-23 MED ORDER — SUPREP BOWEL PREP KIT 17.5-3.13-1.6 GM/177ML PO SOLN: 1.0000 | ORAL | 0 refills | Status: DC

## 2020-06-23 NOTE — Progress Notes (Signed)
Gastroenterology Consultation  Referring Provider:     Jearld Fenton, NP Primary Care Physician:  Jearld Fenton, NP Primary Gastroenterologist:  Dr. Allen Norris     Reason for Consultation:     Burping with left-sided abdominal pain        HPI:   Troy Parrish. is a 60 y.o. y/o male referred for consultation & management of Burping with left-sided abdominal pain by Dr. Garnette Gunner, Coralie Keens, NP.  This patient comes in today with a history of what he describes as a [place] smelling her that he has intermittently.  He denies any has it every day but has it often enough that he reports it bothers him and his family members.  The patient also states that he has left-sided abdominal pain.  The abdominal pain is worse when he leans over on that side and he has to stand up and walk around to make the pain go away.  He also states that if he has a violent cough that will also make the pain go away.  There is no report of any unexplained weight loss fevers chills nausea vomiting black stools or bloody stools.  He does take omeprazole for his heartburn.  He denies drinking with a straw, eating quickly, drinking carbonated drinks or chewing gum. The patient's last colonoscopy was 5 years ago and it was recommended that he have a repeat colonoscopy in 5 years.  Past Medical History:  Diagnosis Date  . Bronchitis    Gets bronchitis almost every year  . CAD S/P percutaneous coronary angioplasty 06/2011; 6/ & 11/2013   S/P PCI to all 3 major vessels;a)  Ant STEMI 2001- BMS-LAD x2 -->(redo PCI 6/'15 -- pLAD Xience DES 2.5 x 12- 2.75 mm, mLAD 2.25 x 12 - 2.7 mm), b) '06 UA --> Cx- OM DES; c) 2013 Inf MI BMS mRCA --> d) 10/'15 PCI dRCA Promus P DES 4.0 x 16 (4.25 mm); PTCA of RPL2 (2.0 mm) &RPDA (2.25 mm) 11/2013; 4/18 PCI OM1 Synergy DES  2.5 x 16  . COPD (chronic obstructive pulmonary disease) ([place])   . Elevated WBC count   . Emphysema of lung ([place])   . Essential hypertension 07/07/2011  . Former heavy tobacco  smoker     quit in May 2015 after multiple attempts at trying to quit before   . GERD (gastroesophageal reflux disease)   . Glucose intolerance (pre-diabetes) June 2015    hemoglobin A1c 6.6  . Headache    "Imdur related; stopped taking it; headaches went away" (11/15/2013)  . Heart murmur   . History of colon polyps   . History of stomach ulcers   . Hyperlipidemia with target LDL less than 70 07/07/2011  . Metabolic syndrome    Pre-diabetes, hypertension and truncal obesity as well as dyslipidemia  . OSA on CPAP   . Pneumonia "several times"  . Recurrent upper respiratory infection (URI)   . Seizures ([place]) "several"   "last one was ~ 2011" (11/15/2013)  . Shortness of breath dyspnea   . ST elevation myocardial infarction (STEMI) of anterior wall ([place]) 2001   History of -- 2 stents in early and distal mid LAD; prior cardiologist was Dr. Remi Haggard in [place]  . ST elevation myocardial infarction (STEMI) of inferior wall ([place]) 07/07/2011   Occluded RCA 4.49E01 INTEGRITY; Echo 07/2013: EF 45-50%, Inferior & Posterolateral HK.   Marland Kitchen Umbilical hernia   . Unstable angina ([place]) 2006; 07/2013   Cx-OM -  PCI 2.5 mm 13 mm Cypher DES [name], [place]) ; 07/2013: Severe ISR of both prox & Distal LAD stentS --> 2 DES stents (1 at prox edge of the proximal stent, 2nd covers the entire distal stent as well as proximal and distal edge stenose)   . Upper respiratory infection     Past Surgical History:  Procedure Laterality Date  . Cardiac MRI [facility]  09/2014   EF 51%. Mod HK of basal-mid Inf wall, mild HK of basal inferoseptum & basal-mid inferolateral wall with hyper enhancement (c/w subendocard RCA MI with significant viability), focal hyper enhancement of apical wall c/w dLAD subendocard MI & complete LAD viability. . No aortic stenosis. Normal RV function. No Ischemia on Adenosine Stress.  . COLONOSCOPY WITH PROPOFOL N/A 03/28/2015   Procedure: COLONOSCOPY WITH PROPOFOL;  Surgeon: Lucilla Lame, MD;   Location: [place];  Service: Endoscopy;  Laterality: N/A;  . CORONARY ANGIOPLASTY WITH STENT PLACEMENT  2001   ANTERIOR mi with a  PCI to LAD; [place], [place] - Dr. Dorris Fetch  . CORONARY ANGIOPLASTY WITH STENT PLACEMENT  2006   [place] by Dr Dorris Fetch -lesion lft circ/OM1 with 2.5 x 85m Cypher DES  . CORONARY STENT INTERVENTION N/A 05/19/2016   Procedure: Coronary Stent Intervention;  Surgeon: DLeonie Man MD;  [facility];  Service: Cardiovascular: PCI pOM1 - Synergy DES 2.5 x 16 (overlaps old stent)  . ESOPHAGOGASTRODUODENOSCOPY (EGD) WITH PROPOFOL N/A 03/28/2015   Procedure: ESOPHAGOGASTRODUODENOSCOPY (EGD) WITH PROPOFOL;  Surgeon: DLucilla Lame MD;  [place] [facility]  Service: Endoscopy;  Laterality: N/A;  . INSERTION OF MESH N/A 04/02/2015   Procedure: INSERTION OF MESH;  Surgeon: RFlorene Glen MD;  Location: [facility];  Service: General;  Laterality: N/A;  . LEFT AND RIGHT HEART CATHETERIZATION WITH CORONARY ANGIOGRAM N/A 07/30/2013   Procedure: LEFT AND RIGHT HEART CATHETERIZATION WITH CORONARY ANGIOGRAM;  Surgeon: DLeonie Man MD;  [facility];  Service: Cardiovascular;  2 separate P & mLAD lesions -> PCI, MOd-Severe dRCA disesase with diffuse RPL/PDA disease (FFR)  . LEFT HEART CATH  08/03/2013   Procedure: LEFT HEART CATH;  Surgeon: DLeonie Man MD;  [facility];  Service: Cardiovascular;;FFR of RCA - non-flow-limiting  . LEFT HEART CATH AND CORONARY ANGIOGRAPHY N/A 05/19/2016   Procedure: Left Heart Cath and Coronary Angiography;  Surgeon: DLeonie Man MD;  [facility];  Service: Cardiovascular: CULPRIT - 85% pOM1 pre-stent. Stents in both proximal and distal RCA widely patent. PTCA sites in RPL & PDA widely patent with stable 70% small RPL 2. Extensive stenting in the LAD widely patent stents with mild to mod Dz btw prox & mid-distal stents.   .Marland KitchenLEFT HEART CATH AND CORONARY ANGIOGRAPHY N/A 02/13/2018    Procedure: LEFT HEART CATH AND CORONARY ANGIOGRAPHY;  Surgeon: HLeonie Man MD;  [facility];  Service: Cardiovascular: Similar findings to last cath post PCI.  Stents are patent.  Roughly 50% proximal LAD lesion noted, may be progression from prior cath but otherwise stable disease.  .Marland KitchenLEFT HEART CATHETERIZATION WITH CORONARY ANGIOGRAM N/A 07/07/2011   Procedure: LEFT HEART CATHETERIZATION WITH CORONARY ANGIOGRAM;  Surgeon: DLeonie Man MD;  [facility];  Service: Cardiovascular;  inferolat post LV infarct ST-elevation MI  . MET/CPET  11/09/2011   submax. effort 1.05 RER peak V02 was 51% ,chronotropic incomp.hrt rate lows 80s to 100  . MET/CPET  September 2016  [facility]: Mild functional impairment due primarily to mild pulmonary and circulatory limitations. Also suggest physical deconditioning (seen with low-normal aerobic reserve). Mild VQ mismatch with exercise.  Ventilatory reserve exhausted with peak exercise demonstrated pulmonary limitation. No significant decrease in postexercise FEV1 compared to rest --> suggest no exercise induced bronchospasm  . NM MYOVIEW ([place] HX)  11/30/2011   EF 45% ,exercise 10 METS, infarct\scar w mild perinfarct ischemia -basal inferolat and mid inferolat region  . NM MYOVIEW LTD  04/25/2016   Exercised for 9 minutes. Reached 90% max. Heart rate. 9.4 METS. Read as a low risk study with normal EF 55-65%. -> on Review -  there does appear to be a small to moderate sized, medium severtiy reversible perfusion defect in the anterior wall - read by computer, but not noted by reader.  Marland Kitchen PERCUTANEOUS CORONARY STENT INTERVENTION (PCI-S)  07/07/2011   Procedure: PERCUTANEOUS CORONARY STENT INTERVENTION (PCI-S);  Surgeon: Leonie Man, MD;  [facility];  Service: Cardiovascular;;occluded RCA ,Integrity Resolute DES 2.75 x 18 mm stent - post-dilated 3.1 mm  . PERCUTANEOUS CORONARY STENT INTERVENTION (PCI-S)  07/30/2013   Procedure:  PERCUTANEOUS CORONARY STENT INTERVENTION (PCI-S);  Surgeon: Leonie Man, MD;  Location: [facility] C[facility];  Service: Cardiovascular;;Distal mid LAD: Xience Alpine DES 2.25 mm x 28 mm (overlapping proximal and distal edge of previous stent) - 2.7 mm; proximal LAD 2.5 mm x 12 mm Xience Alpine DES (2.8 mm);; RCA 60-70% stenosis planned staged procedure  . PERCUTANEOUS CORONARY STENT INTERVENTION (PCI-S) N/A 11/15/2013   Procedure: PERCUTANEOUS CORONARY STENT INTERVENTION (PCI-S);  Surgeon: Leonie Man, MD;  Location: [facility] CATH LAB;  Service: Cardiovascular;  Patent Cx & LAD Stents; PCI -dRCA Promus Premier DES 4.0 mm x 16 mm (4.25 mm) dRCA, 2.0 mm Cutting PTCA of RPL2 [place] & POBA of mRPDA  . POLYPECTOMY  03/28/2015   Procedure: POLYPECTOMY;  Surgeon: Lucilla Lame, MD;  Location: [place];  Service: Endoscopy;;  . RIGHT HEART CATH  June 2015   Normal pressures with severely reduced cardiac output and index. (3.25/1.55)  . TRANSTHORACIC ECHOCARDIOGRAM  07/31/2013; February 2017   a. LVEF 45-50. Inferior posterior hypokinesis with mild dilation. Apparent normal diastolic pressures;   . UMBILICAL HERNIA REPAIR N/A 04/02/2015   Procedure: HERNIA REPAIR UMBILICAL ADULT;  Surgeon: Florene Glen, MD;  Location: [facility];  Service: General;  Laterality: N/A;    Prior to Admission medications   Medication Sig Start Date End Date Taking? Authorizing Provider  albuterol (VENTOLIN HFA) 108 (90 Base) MCG/ACT inhaler INHALE TWO PUFFS BY MOUTH EVERY 6 HOURS AS NEEDED FOR SHORTNESS OF BREATH 06/04/20  Yes Baity, Coralie Keens, NP  clobetasol cream (TEMOVATE) 0.05 % APPLY TOPICALLY TO AFFECTED AREA(S) DAILY AS NEEDED FOR ECZEMA Patient taking differently: Apply 1 application topically daily as needed (eczema). 01/10/18  Yes Jearld Fenton, NP  fenofibrate (TRICOR) 48 MG tablet TAKE ONE TABLET BY MOUTH DAILY 02/11/20  Yes Leonie Man, MD  Fluticasone-Salmeterol (ADVAIR) 100-50 MCG/DOSE AEPB Inhale 1 puff into  the lungs in the morning and at bedtime. 06/04/20  Yes Jearld Fenton, NP  Melatonin 1 MG CAPS Take 1 mg by mouth at bedtime as needed (sleep).   Yes [provider]  metoprolol succinate (TOPROL-XL) 25 MG 24 hr tablet TAKE 1.5 TABLETS BY MOUTH EVERY EVENING 06/06/20  Yes Leonie Man, MD  nitroGLYCERIN (NITROSTAT) 0.4 MG SL tablet Place 1 tablet (0.4 mg total) under the tongue every  5 (five) minutes as needed for chest pain. 01/18/18  Yes Leonie Man, MD  Omega-3 Fatty Acids (FISH OIL) 1000 MG CPDR Take 3 g by mouth daily. 08/17/18  Yes Leonie Man, MD  omeprazole (PRILOSEC) 40 MG capsule Take 1 capsule (40 mg total) by mouth daily. 05/01/20  Yes Leonie Man, MD  prasugrel (EFFIENT) 10 MG TABS tablet Take 1 tablet (10 mg total) by mouth daily. 04/04/20 07/03/20 Yes Buford Dresser, MD  rosuvastatin (CRESTOR) 40 MG tablet TAKE ONE TABLET BY MOUTH DAILY 03/27/20  Yes Leonie Man, MD  fluticasone [facility]) 50 MCG/ACT nasal spray Place 2 sprays into both nostrils daily. Patient taking differently: Place 2 sprays into both nostrils daily as needed (congestion). 05/15/17 09/25/19  Letitia Neri L, PA-C  Na Sulfate-K Sulfate-Mg Sulf (SUPREP BOWEL PREP KIT) 17.5-3.13-1.6 GM/177ML SOLN Take 1 kit by mouth as directed. 06/23/20   Lucilla Lame, MD    Family History  Problem Relation Age of Onset  . Coronary artery disease Father   . Heart disease Father   . Stroke Father   . Hypertension Father   . Hyperlipidemia Mother   . Diabetes Mother   . Hyperlipidemia Maternal Grandmother   . Diabetes Maternal Grandmother   . Hyperlipidemia Maternal Grandfather   . Diabetes Maternal Grandfather   . Emphysema Maternal Grandfather   . Heart disease Paternal Grandmother   . Heart disease Paternal Grandfather   . Cancer Maternal Aunt        breast     Social History   Tobacco Use  . Smoking status: Current Every Day Smoker    Packs/day: 0.50    Years: 40.00    Pack years:  20.00    Types: Cigarettes    Last attempt to quit: 01/19/2017    Years since quitting: 3.4  . Smokeless tobacco: Never Used  . Tobacco comment: Quit with Bupropion 176m - > unfortunately, he restarted  Substance Use Topics  . Alcohol use: Yes    Comment: rare  . Drug use: No    Allergies as of 06/23/2020 - Review Complete 06/23/2020  Allergen Reaction Noted  . Niacin and related Hives and Itching 12/31/2013    Review of Systems:    All systems reviewed and negative except where noted in HPI.   Physical Exam:  BP 120/76   Pulse 92   Temp (!) 97.5 F (36.4 C) (Temporal)   Ht _0  (1.778 m)   Wt 180 lb 3.2 oz (81.7 kg)   BMI 25.86 kg/m  No LMP for male patient. General:   Alert,  Well-developed, well-nourished, pleasant and cooperative in NAD Head:  Normocephalic and atraumatic. Eyes:  Sclera clear, no icterus.   Conjunctiva pink. Ears:  Normal auditory acuity. Neck:  Supple; no masses or thyromegaly. Lungs:  Respirations even and unlabored.  Clear throughout to auscultation.   No wheezes, crackles, or rhonchi. No acute distress. Heart:  Regular rate and rhythm; no murmurs, clicks, rubs, or gallops. Abdomen:  Normal bowel sounds.  No bruits.  Soft, non-tender and non-distended without masses, hepatosplenomegaly or hernias noted.  No guarding or rebound tenderness.  Negative Carnett sign.   Rectal:  Deferred.  Pulses:  Normal pulses noted. Extremities:  No clubbing or edema.  No cyanosis. Neurologic:  Alert and oriented x3;  grossly normal neurologically. Skin:  Intact without significant lesions or rashes.  No jaundice. Lymph Nodes:  No significant cervical adenopathy. Psych:  Alert and cooperative. Normal mood and  affect.  Imaging Studies: No results found.  Assessment and Plan:   Milford Cilento. is a 60 y.o. y/o male who comes in today with a history of dyspepsia with foul-smelling burps.  The patient does report that he was diagnosed in the past with H. Pylori  and treated but he is not able to recall being checked to see if he had eradicated the bacteria.  The patient will be set up for a H. Pylori antibody test. The patient will be set up for an EGD and colonoscopy due to his dyspepsia and history of colon polyps.  The patient has been told that his left-sided abdominal pain is consistent with muscular skeletal pain from his description of the timing of the pain and its aggravating relieving characteristics. The patient has been explained the plan and agrees with it.    Lucilla Lame, MD. Marval Regal    Note: This dictation was prepared with Dragon dictation along with smaller phrase technology. Any transcriptional errors that result from this process are unintentional.

## 2020-06-24 ENCOUNTER — Encounter: Payer: Self-pay | Admitting: Gastroenterology

## 2020-06-24 ENCOUNTER — Other Ambulatory Visit: Payer: Self-pay

## 2020-06-24 DIAGNOSIS — R1013 Epigastric pain: Secondary | ICD-10-CM

## 2020-06-24 DIAGNOSIS — R109 Unspecified abdominal pain: Secondary | ICD-10-CM

## 2020-06-24 LAB — H. PYLORI ANTIBODY, IGG: H. pylori, IgG AbS: 0.79 Index Value (ref 0.00–0.79)

## 2020-06-25 ENCOUNTER — Telehealth: Payer: Self-pay

## 2020-06-25 NOTE — Telephone Encounter (Signed)
Pt notified of lab results

## 2020-06-25 NOTE — Telephone Encounter (Signed)
-----   Message from Lucilla Lame, MD sent at 06/24/2020  5:38 PM EDT ----- Left the patient know that his antibodies for H. Pylori were negative for any infection.

## 2020-06-26 ENCOUNTER — Telehealth: Payer: Self-pay

## 2020-06-26 NOTE — Discharge Instructions (Signed)

## 2020-06-26 NOTE — Telephone Encounter (Signed)
Pt.notified

## 2020-06-26 NOTE — Telephone Encounter (Signed)
-----   Message from Leonie Man, MD sent at 06/24/2020  6:19 PM EDT ----- Per my last note:  Presence of drug coated stent in right coronary artery (Chronic)     With extensive PCI to just but every vessel system, he is on lifelong Thienopyridine therapy.  Did not do well with Brilinta and Plavix.  He is on standing dose of prasugrel.  Okay to hold 7 days preop for surgeries (9 days for neurologic or spinal procedures)  Troy Hew, MD   ----- Message ----- From: [name], [name] Sent: 06/24/2020   5:10 PM EDT To: Leonie Man, MD

## 2020-06-30 ENCOUNTER — Ambulatory Visit: Payer: PPO | Admitting: Anesthesiology

## 2020-06-30 ENCOUNTER — Encounter
Admission: RE | Disposition: A | Discharge status: Home / Self Care | Payer: Self-pay | Source: Ambulatory Visit | Attending: Gastroenterology

## 2020-06-30 ENCOUNTER — Encounter: Payer: Self-pay | Admitting: Gastroenterology

## 2020-06-30 ENCOUNTER — Other Ambulatory Visit: Payer: Self-pay

## 2020-06-30 ENCOUNTER — Ambulatory Visit
Admission: RE | Admit: 2020-06-30 | Discharge: 2020-06-30 | Disposition: A | Discharge status: Home / Self Care | Payer: PPO | Source: Ambulatory Visit | Attending: Gastroenterology | Admitting: Gastroenterology

## 2020-06-30 DIAGNOSIS — Z79899 Other long term (current) drug therapy: Secondary | ICD-10-CM | POA: Diagnosis not present

## 2020-06-30 DIAGNOSIS — Z833 Family history of diabetes mellitus: Secondary | ICD-10-CM | POA: Diagnosis not present

## 2020-06-30 DIAGNOSIS — K635 Polyp of colon: Secondary | ICD-10-CM

## 2020-06-30 DIAGNOSIS — Z803 Family history of malignant neoplasm of breast: Secondary | ICD-10-CM | POA: Insufficient documentation

## 2020-06-30 DIAGNOSIS — D123 Benign neoplasm of transverse colon: Secondary | ICD-10-CM | POA: Diagnosis not present

## 2020-06-30 DIAGNOSIS — Z7689 Persons encountering health services in other specified circumstances: Secondary | ICD-10-CM | POA: Diagnosis not present

## 2020-06-30 DIAGNOSIS — Z823 Family history of stroke: Secondary | ICD-10-CM | POA: Diagnosis not present

## 2020-06-30 DIAGNOSIS — Z825 Family history of asthma and other chronic lower respiratory diseases: Secondary | ICD-10-CM | POA: Insufficient documentation

## 2020-06-30 DIAGNOSIS — J439 Emphysema, unspecified: Secondary | ICD-10-CM | POA: Insufficient documentation

## 2020-06-30 DIAGNOSIS — R1013 Epigastric pain: Secondary | ICD-10-CM

## 2020-06-30 DIAGNOSIS — K5730 Diverticulosis of large intestine without perforation or abscess without bleeding: Secondary | ICD-10-CM | POA: Diagnosis not present

## 2020-06-30 DIAGNOSIS — K31A11 Gastric intestinal metaplasia without dysplasia, involving the antrum: Secondary | ICD-10-CM | POA: Insufficient documentation

## 2020-06-30 DIAGNOSIS — K2950 Unspecified chronic gastritis without bleeding: Secondary | ICD-10-CM | POA: Insufficient documentation

## 2020-06-30 DIAGNOSIS — D124 Benign neoplasm of descending colon: Secondary | ICD-10-CM | POA: Diagnosis not present

## 2020-06-30 DIAGNOSIS — Z8249 Family history of ischemic heart disease and other diseases of the circulatory system: Secondary | ICD-10-CM | POA: Insufficient documentation

## 2020-06-30 DIAGNOSIS — F17210 Nicotine dependence, cigarettes, uncomplicated: Secondary | ICD-10-CM | POA: Diagnosis not present

## 2020-06-30 DIAGNOSIS — Z7951 Long term (current) use of inhaled steroids: Secondary | ICD-10-CM | POA: Diagnosis not present

## 2020-06-30 DIAGNOSIS — R109 Unspecified abdominal pain: Secondary | ICD-10-CM

## 2020-06-30 DIAGNOSIS — Z1211 Encounter for screening for malignant neoplasm of colon: Secondary | ICD-10-CM | POA: Diagnosis not present

## 2020-06-30 DIAGNOSIS — E119 Type 2 diabetes mellitus without complications: Secondary | ICD-10-CM | POA: Insufficient documentation

## 2020-06-30 DIAGNOSIS — K641 Second degree hemorrhoids: Secondary | ICD-10-CM | POA: Insufficient documentation

## 2020-06-30 DIAGNOSIS — K31A19 Gastric intestinal metaplasia without dysplasia, unspecified site: Secondary | ICD-10-CM | POA: Insufficient documentation

## 2020-06-30 DIAGNOSIS — Z8349 Family history of other endocrine, nutritional and metabolic diseases: Secondary | ICD-10-CM | POA: Diagnosis not present

## 2020-06-30 DIAGNOSIS — Z888 Allergy status to other drugs, medicaments and biological substances status: Secondary | ICD-10-CM | POA: Diagnosis not present

## 2020-06-30 DIAGNOSIS — D122 Benign neoplasm of ascending colon: Secondary | ICD-10-CM | POA: Insufficient documentation

## 2020-06-30 DIAGNOSIS — I110 Hypertensive heart disease with heart failure: Secondary | ICD-10-CM | POA: Insufficient documentation

## 2020-06-30 DIAGNOSIS — Z86010 Personal history of colonic polyps: Secondary | ICD-10-CM

## 2020-06-30 DIAGNOSIS — K219 Gastro-esophageal reflux disease without esophagitis: Secondary | ICD-10-CM | POA: Insufficient documentation

## 2020-06-30 DIAGNOSIS — I509 Heart failure, unspecified: Secondary | ICD-10-CM | POA: Insufficient documentation

## 2020-06-30 DIAGNOSIS — K449 Diaphragmatic hernia without obstruction or gangrene: Secondary | ICD-10-CM | POA: Insufficient documentation

## 2020-06-30 HISTORY — PX: BIOPSY: SHX5522

## 2020-06-30 HISTORY — PX: COLONOSCOPY WITH PROPOFOL: SHX5780

## 2020-06-30 HISTORY — PX: POLYPECTOMY: SHX5525

## 2020-06-30 HISTORY — PX: ESOPHAGOGASTRODUODENOSCOPY (EGD) WITH PROPOFOL: SHX5813

## 2020-06-30 SURGERY — COLONOSCOPY WITH PROPOFOL
Anesthesia: General | Site: Rectum

## 2020-06-30 MED ORDER — ACETAMINOPHEN 325 MG PO TABS: 325.0000 mg | ORAL_TABLET | ORAL | Status: DC | PRN

## 2020-06-30 MED ORDER — STERILE WATER FOR IRRIGATION IR SOLN
Status: DC | PRN
  Administered 2020-06-30: 100 mL

## 2020-06-30 MED ORDER — PROPOFOL 10 MG/ML IV BOLUS
INTRAVENOUS | Status: DC | PRN
  Administered 2020-06-30: 100 mg via INTRAVENOUS
  Administered 2020-06-30 (×9): 20 mg via INTRAVENOUS

## 2020-06-30 MED ORDER — SODIUM CHLORIDE 0.9 % IV SOLN: INTRAVENOUS | Status: DC

## 2020-06-30 MED ORDER — LIDOCAINE HCL (CARDIAC) PF 100 MG/5ML IV SOSY
PREFILLED_SYRINGE | INTRAVENOUS | Status: DC | PRN
  Administered 2020-06-30: 30 mg via INTRAVENOUS

## 2020-06-30 MED ORDER — LACTATED RINGERS IV SOLN: INTRAVENOUS | Status: DC

## 2020-06-30 MED ORDER — ACETAMINOPHEN 160 MG/5ML PO SOLN: 325.0000 mg | ORAL | Status: DC | PRN

## 2020-06-30 MED ORDER — ONDANSETRON HCL 4 MG/2ML IJ SOLN: 4.0000 mg | Freq: Once | INTRAMUSCULAR | Status: DC | PRN

## 2020-06-30 SURGICAL SUPPLY — 38 items
BALLN DILATOR 10-12 8 (BALLOONS)
BALLN DILATOR 12-15 8 (BALLOONS)
BALLN DILATOR 15-18 8 (BALLOONS)
BALLN DILATOR CRE 0-12 8 (BALLOONS)
BALLN DILATOR ESOPH 8 10 CRE (MISCELLANEOUS) IMPLANT
BALLOON DILATOR 12-15 8 (BALLOONS) IMPLANT
BALLOON DILATOR 15-18 8 (BALLOONS) IMPLANT
BALLOON DILATOR CRE 0-12 8 (BALLOONS) IMPLANT
BLOCK BITE 60FR ADLT L/F GRN (MISCELLANEOUS) ×3 IMPLANT
CLIP HMST 235XBRD CATH ROT (MISCELLANEOUS) IMPLANT
CLIP RESOLUTION 360 11X235 (MISCELLANEOUS)
ELECT REM PT RETURN 9FT ADLT (ELECTROSURGICAL)
ELECTRODE REM PT RTRN 9FT ADLT (ELECTROSURGICAL) IMPLANT
FCP ESCP3.2XJMB 240X2.8X (MISCELLANEOUS)
FORCEPS BIOP RAD 4 LRG CAP 4 (CUTTING FORCEPS) ×3 IMPLANT
FORCEPS BIOP RJ4 240 W/NDL (MISCELLANEOUS)
FORCEPS ESCP3.2XJMB 240X2.8X (MISCELLANEOUS) IMPLANT
GOWN CVR UNV OPN BCK APRN NK (MISCELLANEOUS) ×4 IMPLANT
GOWN ISOL THUMB LOOP REG UNIV (MISCELLANEOUS) ×6
INJECTOR VARIJECT VIN23 (MISCELLANEOUS) IMPLANT
KIT DEFENDO VALVE AND CONN (KITS) IMPLANT
KIT PRC NS LF DISP ENDO (KITS) ×2 IMPLANT
KIT PROCEDURE OLYMPUS (KITS) ×3
MANIFOLD NEPTUNE II (INSTRUMENTS) ×3 IMPLANT
MARKER SPOT ENDO TATTOO 5ML (MISCELLANEOUS) IMPLANT
PROBE APC STR FIRE (PROBE) IMPLANT
RETRIEVER NET PLAT FOOD (MISCELLANEOUS) IMPLANT
RETRIEVER NET ROTH 2.5X230 LF (MISCELLANEOUS) IMPLANT
SNARE COLD EXACTO (MISCELLANEOUS) ×3 IMPLANT
SNARE SHORT THROW 13M SML OVAL (MISCELLANEOUS) IMPLANT
SNARE SHORT THROW 30M LRG OVAL (MISCELLANEOUS) IMPLANT
SNARE SNG USE RND 15MM (INSTRUMENTS) IMPLANT
SPOT EX ENDOSCOPIC TATTOO (MISCELLANEOUS)
SYR INFLATION 60ML (SYRINGE) IMPLANT
TRAP ETRAP POLY (MISCELLANEOUS) ×3 IMPLANT
VARIJECT INJECTOR VIN23 (MISCELLANEOUS)
WATER STERILE IRR 250ML POUR (IV SOLUTION) ×3 IMPLANT
WIRE CRE 18-20MM 8CM F G (MISCELLANEOUS) IMPLANT

## 2020-06-30 NOTE — Anesthesia Procedure Notes (Signed)
Performed by: Vanetta Shawl, CRNA Ventilation: Oral airway inserted - appropriate to patient size

## 2020-06-30 NOTE — Anesthesia Postprocedure Evaluation (Signed)
Anesthesia Post Note  Patient: Troy Parrish.  Procedure(s) Performed: COLONOSCOPY WITH PROPOFOL (N/A Rectum) ESOPHAGOGASTRODUODENOSCOPY (EGD) WITH PROPOFOL (N/A Esophagus) BIOPSY (N/A Esophagus) POLYPECTOMY (N/A Rectum)     Patient location during evaluation: PACU Anesthesia Type: General Level of consciousness: awake Pain management: pain level controlled Vital Signs Assessment: post-procedure vital signs reviewed and stable Respiratory status: respiratory function stable Cardiovascular status: stable Postop Assessment: no signs of nausea or vomiting Anesthetic complications: no   No complications documented.  Veda Canning

## 2020-06-30 NOTE — Anesthesia Procedure Notes (Signed)
Date/Time: 06/30/2020 9:18 AM Performed by: Vanetta Shawl, CRNA

## 2020-06-30 NOTE — Anesthesia Preprocedure Evaluation (Signed)
Anesthesia Evaluation  Patient identified by MRN, date of birth, ID band Patient awake    Reviewed: Allergy & Precautions, NPO status   Airway Mallampati: II  TM Distance: >3 FB     Dental   Pulmonary sleep apnea and Continuous Positive Airway Pressure Ventilation , COPD, Current Smoker and Patient abstained from smoking.,    Pulmonary exam normal        Cardiovascular hypertension, + CAD, + Past MI, + Cardiac Stents and +CHF ( EF~45% with hypokinesis of the inferior and inferolateral myocardium)   Rhythm:Regular Rate:Normal     Neuro/Psych  Headaches, Seizures -,     GI/Hepatic GERD  ,  Endo/Other  diabetes, Type 2  Renal/GU      Musculoskeletal   Abdominal   Peds  Hematology   Anesthesia Other Findings   Reproductive/Obstetrics                             Anesthesia Physical Anesthesia Plan  ASA: III  Anesthesia Plan: General   Post-op Pain Management:    Induction: Intravenous  PONV Risk Score and Plan: Propofol infusion, TIVA and Treatment may vary due to age or medical condition  Airway Management Planned: Natural Airway and Nasal Cannula  Additional Equipment:   Intra-op Plan:   Post-operative Plan:   Informed Consent: I have reviewed the patients History and Physical, chart, labs and discussed the procedure including the risks, benefits and alternatives for the proposed anesthesia with the patient or authorized representative who has indicated his/her understanding and acceptance.       Plan Discussed with: CRNA  Anesthesia Plan Comments:         Anesthesia Quick Evaluation

## 2020-06-30 NOTE — Op Note (Signed)
[facility] Gastroenterology Patient Name: Troy Parrish Procedure Date: 06/30/2020 9:08 AM MRN: [id number] Account #: [id number] Date of Birth: 04-13-1960 Admit Type: Outpatient Age: 60 Room: [facility] OR ROOM 01 Gender: Male Note Status: Finalized Procedure:             Colonoscopy Indications:           High risk colon cancer surveillance: Personal history                         of colonic polyps Providers:             Lucilla Lame MD, MD Referring MD:          Jearld Fenton (Referring MD) Medicines:             Propofol per Anesthesia Complications:         No immediate complications. Procedure:             Pre-Anesthesia Assessment:                        - Prior to the procedure, a History and Physical was                         performed, and patient medications and allergies were                         reviewed. The patient's tolerance of previous                         anesthesia was also reviewed. The risks and benefits                         of the procedure and the sedation options and risks                         were discussed with the patient. All questions were                         answered, and informed consent was obtained. Prior                         Anticoagulants: The patient has taken no previous                         anticoagulant or antiplatelet agents. ASA Grade                         Assessment: II - A patient with mild systemic disease.                         After reviewing the risks and benefits, the patient                         was deemed in satisfactory condition to undergo the                         procedure.  After obtaining informed consent, the colonoscope was                         passed under direct vision. Throughout the procedure,                         the patient's blood pressure, pulse, and oxygen                         saturations were monitored continuously. The was                          introduced through the anus and advanced to the the                         cecum, identified by appendiceal orifice and ileocecal                         valve. The colonoscopy was performed without                         difficulty. The patient tolerated the procedure well.                         The quality of the bowel preparation was excellent. Findings:      The perianal and digital rectal examinations were normal.      A 2 mm polyp was found in the ascending colon. The polyp was sessile.       The polyp was removed with a cold snare. Resection and retrieval were       complete.      Two sessile polyps were found in the transverse colon. The polyps were 3       to 6 mm in size. These polyps were removed with a cold snare. Resection       and retrieval were complete.      A 4 mm polyp was found in the descending colon. The polyp was sessile.       The polyp was removed with a cold snare. Resection and retrieval were       complete.      Multiple small-mouthed diverticula were found in the entire colon.      Non-bleeding internal hemorrhoids were found during retroflexion. The       hemorrhoids were Grade II (internal hemorrhoids that prolapse but reduce       spontaneously). Impression:            - One 2 mm polyp in the ascending colon, removed with                         a cold snare. Resected and retrieved.                        - Two 3 to 6 mm polyps in the transverse colon,                         removed with a cold snare. Resected and retrieved.                        -  One 4 mm polyp in the descending colon, removed with                         a cold snare. Resected and retrieved.                        - Diverticulosis in the entire examined colon.                        - Non-bleeding internal hemorrhoids. Recommendation:        - Discharge patient to home.                        - Resume previous diet.                        - Continue present medications.                         - Repeat colonoscopy in 5 years for surveillance. Procedure Code(s):     --- Professional ---                        [phone], Colonoscopy, flexible; with removal of                         tumor(s), polyp(s), or other lesion(s) by snare                         technique Diagnosis Code(s):     --- Professional ---                        Z86.010, Personal history of colonic polyps                        K63.5, Polyp of colon CPT copyright 2019 American Medical Association. All rights reserved. The codes documented in this report are preliminary and upon coder review may  be revised to meet current compliance requirements. Lucilla Lame MD, MD 06/30/2020 9:44:32 AM This report has been signed electronically. Number of Addenda: 0 Note Initiated On: 06/30/2020 9:08 AM Scope Withdrawal Time: 0 hours 8 minutes 29 seconds  Total Procedure Duration: 0 hours 11 minutes 15 seconds  Estimated Blood Loss:  Estimated blood loss: none.      [facility]

## 2020-06-30 NOTE — Op Note (Signed)
[facility] Gastroenterology Patient Name: Troy Parrish Procedure Date: 06/30/2020 9:12 AM MRN: [id number] Account #: [id number] Date of Birth: [birth year] Admit Type: Outpatient Age: 60 Room: [facility] OR ROOM 01 Gender: Male Note Status: Finalized Procedure:             Upper GI endoscopy Indications:           Dyspepsia Providers:             Lucilla Lame MD, MD Referring MD:          Jearld Fenton (Referring MD) Medicines:             Propofol per Anesthesia Complications:         No immediate complications. Procedure:             Pre-Anesthesia Assessment:                        - Prior to the procedure, a History and Physical was                         performed, and patient medications and allergies were                         reviewed. The patient's tolerance of previous                         anesthesia was also reviewed. The risks and benefits                         of the procedure and the sedation options and risks                         were discussed with the patient. All questions were                         answered, and informed consent was obtained. Prior                         Anticoagulants: The patient has taken no previous                         anticoagulant or antiplatelet agents. ASA Grade                         Assessment: II - A patient with mild systemic disease.                         After reviewing the risks and benefits, the patient                         was deemed in satisfactory condition to undergo the                         procedure.                        After obtaining informed consent, the endoscope was  passed under direct vision. Throughout the procedure,                         the patient's blood pressure, pulse, and oxygen                         saturations were monitored continuously. The was                         introduced through the mouth, and advanced to the                          second part of duodenum. The upper GI endoscopy was                         accomplished without difficulty. The patient tolerated                         the procedure well. Findings:      A small hiatal hernia was present.      A few localized small erosions with no stigmata of recent bleeding were       found in the gastric antrum. Biopsies were taken with a cold forceps for       histology.      The examined duodenum was normal. Impression:            - Small hiatal hernia.                        - Erosive gastropathy with no stigmata of recent                         bleeding. Biopsied.                        - Normal examined duodenum. Recommendation:        - Discharge patient to home.                        - Resume previous diet.                        - Continue present medications.                        - Await pathology results.                        - Perform a colonoscopy today. Procedure Code(s):     --- Professional ---                        [phone], Esophagogastroduodenoscopy, flexible,                         transoral; with biopsy, single or multiple Diagnosis Code(s):     --- Professional ---                        R10.13, Epigastric pain  K31.89, Other diseases of stomach and duodenum CPT copyright 2019 American Medical Association. All rights reserved. The codes documented in this report are preliminary and upon coder review may  be revised to meet current compliance requirements. Lucilla Lame MD, MD 06/30/2020 9:30:54 AM This report has been signed electronically. Number of Addenda: 0 Note Initiated On: 06/30/2020 9:12 AM Total Procedure Duration: 0 hours 3 minutes 12 seconds  Estimated Blood Loss:  Estimated blood loss: none.      [facility]

## 2020-06-30 NOTE — H&P (Signed)
Lucilla Lame, MD [address] [place], [place] [address] Phone:[phone] Fax : [phone]  Primary Care Physician:  Jearld Fenton, NP Primary Gastroenterologist:  Dr. Allen Norris  Pre-Procedure History & Physical: HPI:  Troy Parrish. is a 60 y.o. male is here for an endoscopy and colonoscopy.   Past Medical History:  Diagnosis Date  . Bronchitis    Gets bronchitis almost every year  . CAD S/P percutaneous coronary angioplasty 06/2011; 6/ & 11/2013   S/P PCI to all 3 major vessels;a)  Ant STEMI 2001- BMS-LAD x2 -->(redo PCI 6/'15 -- pLAD Xience DES 2.5 x 12- 2.75 mm, mLAD 2.25 x 12 - 2.7 mm), b) '06 UA --> Cx- OM DES; c) 2013 Inf MI BMS mRCA --> d) 10/'15 PCI dRCA Promus P DES 4.0 x 16 (4.25 mm); PTCA of RPL2 (2.0 mm) &RPDA (2.25 mm) 11/2013; 4/18 PCI OM1 Synergy DES  2.5 x 16  . COPD (chronic obstructive pulmonary disease) ([place])   . Elevated WBC count   . Emphysema of lung ([place])   . Essential hypertension 07/07/2011  . Former heavy tobacco smoker     quit in May 2015 after multiple attempts at trying to quit before   . GERD (gastroesophageal reflux disease)   . Glucose intolerance (pre-diabetes) June 2015    hemoglobin A1c 6.6  . Headache    "Imdur related; stopped taking it; headaches went away" (11/15/2013)  . Heart murmur   . History of colon polyps   . History of stomach ulcers   . Hyperlipidemia with target LDL less than 70 07/07/2011  . Metabolic syndrome    Pre-diabetes, hypertension and truncal obesity as well as dyslipidemia  . OSA on CPAP   . Pneumonia "several times"  . Recurrent upper respiratory infection (URI)   . Seizures ([place]) "several"   "last one was ~ 2011" (11/15/2013)  . Shortness of breath dyspnea   . ST elevation myocardial infarction (STEMI) of anterior wall ([place]) 2001   History of -- 2 stents in early and distal mid LAD; prior cardiologist was Dr. Remi Haggard in [place]  . ST elevation myocardial infarction (STEMI) of inferior  wall ([place]) 07/07/2011   Occluded RCA 7.56E33 INTEGRITY; Echo 07/2013: EF 45-50%, Inferior & Posterolateral HK.   Marland Kitchen Umbilical hernia   . Unstable angina ([place]) 2006; 07/2013   Cx-OM - PCI 2.5 mm 13 mm Cypher DES [name], [place]) ; 07/2013: Severe ISR of both prox & Distal LAD stentS --> 2 DES stents (1 at prox edge of the proximal stent, 2nd covers the entire distal stent as well as proximal and distal edge stenose)   . Upper respiratory infection     Past Surgical History:  Procedure Laterality Date  . Cardiac MRI [facility]  09/2014   EF 51%. Mod HK of basal-mid Inf wall, mild HK of basal inferoseptum & basal-mid inferolateral wall with hyper enhancement (c/w subendocard RCA MI with significant viability), focal hyper enhancement of apical wall c/w dLAD subendocard MI & complete LAD viability. . No aortic stenosis. Normal RV function. No Ischemia on Adenosine Stress.  . COLONOSCOPY WITH PROPOFOL N/A 03/28/2015   Procedure: COLONOSCOPY WITH PROPOFOL;  Surgeon: Lucilla Lame, MD;  Location: [facility];  Service: Endoscopy;  Laterality: N/A;  . CORONARY ANGIOPLASTY WITH STENT PLACEMENT  2001   ANTERIOR mi with a  PCI to LAD; [place], [place] - Dr. Dorris Fetch  . CORONARY ANGIOPLASTY WITH STENT PLACEMENT  2006   [place] by  Dr Dorris Fetch -lesion lft circ/OM1 with 2.5 x 43mm Cypher DES  . CORONARY STENT INTERVENTION N/A 05/19/2016   Procedure: Coronary Stent Intervention;  Surgeon: Leonie Man, MD;  Location: [place] CV LAB;  Service: Cardiovascular: PCI pOM1 - Synergy DES 2.5 x 16 (overlaps old stent)  . ESOPHAGOGASTRODUODENOSCOPY (EGD) WITH PROPOFOL N/A 03/28/2015   Procedure: ESOPHAGOGASTRODUODENOSCOPY (EGD) WITH PROPOFOL;  Surgeon: Lucilla Lame, MD;  Location: [place];  Service: Endoscopy;  Laterality: N/A;  . INSERTION OF MESH N/A 04/02/2015   Procedure: INSERTION OF MESH;  Surgeon: Florene Glen, MD;  Location: [facility] ORS;  Service: General;  Laterality: N/A;  . LEFT AND RIGHT HEART  CATHETERIZATION WITH CORONARY ANGIOGRAM N/A 07/30/2013   Procedure: LEFT AND RIGHT HEART CATHETERIZATION WITH CORONARY ANGIOGRAM;  Surgeon: Leonie Man, MD;  Location: [facility] C[facility] LAB;  Service: Cardiovascular;  2 separate P & mLAD lesions -> PCI, MOd-Severe dRCA disesase with diffuse RPL/PDA disease (FFR)  . LEFT HEART CATH  08/03/2013   Procedure: LEFT HEART CATH;  Surgeon: Leonie Man, MD;  Location: [facility] CATH LAB;  Service: Cardiovascular;;FFR of RCA - non-flow-limiting  . LEFT HEART CATH AND CORONARY ANGIOGRAPHY N/A 05/19/2016   Procedure: Left Heart Cath and Coronary Angiography;  Surgeon: Leonie Man, MD;  Location: [place] CV LAB;  Service: Cardiovascular: CULPRIT - 85% pOM1 pre-stent. Stents in both proximal and distal RCA widely patent. PTCA sites in RPL & PDA widely patent with stable 70% small RPL 2. Extensive stenting in the LAD widely patent stents with mild to mod Dz btw prox & mid-distal stents.   Marland Kitchen LEFT HEART CATH AND CORONARY ANGIOGRAPHY N/A 02/13/2018   Procedure: LEFT HEART CATH AND CORONARY ANGIOGRAPHY;  Surgeon: Leonie Man, MD;  Location: [place] CV LAB;  Service: Cardiovascular: Similar findings to last cath post PCI.  Stents are patent.  Roughly 50% proximal LAD lesion noted, may be progression from prior cath but otherwise stable disease.  Marland Kitchen LEFT HEART CATHETERIZATION WITH CORONARY ANGIOGRAM N/A 07/07/2011   Procedure: LEFT HEART CATHETERIZATION WITH CORONARY ANGIOGRAM;  Surgeon: Leonie Man, MD;  Location: [facility] CATH LAB;  Service: Cardiovascular;  inferolat post LV infarct ST-elevation MI  . MET/CPET  11/09/2011   submax. effort 1.05 RER peak V02 was 51% ,chronotropic incomp.hrt rate lows 80s to 100  . MET/CPET  September 2016   [facility]: Mild functional impairment due primarily to mild pulmonary and circulatory limitations. Also suggest physical deconditioning (seen with low-normal aerobic reserve). Mild VQ mismatch with exercise.  Ventilatory reserve exhausted with  peak exercise demonstrated pulmonary limitation. No significant decrease in postexercise FEV1 compared to rest --> suggest no exercise induced bronchospasm  . NM MYOVIEW ([place] HX)  11/30/2011   EF 45% ,exercise 10 METS, infarct\scar w mild perinfarct ischemia -basal inferolat and mid inferolat region  . NM MYOVIEW LTD  04/25/2016   Exercised for 9 minutes. Reached 90% max. Heart rate. 9.4 METS. Read as a low risk study with normal EF 55-65%. -> on Review -  there does appear to be a small to moderate sized, medium severtiy reversible perfusion defect in the anterior wall - read by computer, but not noted by reader.  Marland Kitchen PERCUTANEOUS CORONARY STENT INTERVENTION (PCI-S)  07/07/2011   Procedure: PERCUTANEOUS CORONARY STENT INTERVENTION (PCI-S);  Surgeon: Leonie Man, MD;  Location: [facility] CATH LAB;  Service: Cardiovascular;;occluded RCA ,Integrity Resolute DES 2.75 x 18 mm stent - post-dilated 3.1 mm  . PERCUTANEOUS CORONARY STENT INTERVENTION (  PCI-S)  07/30/2013   Procedure: PERCUTANEOUS CORONARY STENT INTERVENTION (PCI-S);  Surgeon: Leonie Man, MD;  Location: [facility] CATH LAB;  Service: Cardiovascular;;Distal mid LAD: Xience Alpine DES 2.25 mm x 28 mm (overlapping proximal and distal edge of previous stent) - 2.7 mm; proximal LAD 2.5 mm x 12 mm Xience Alpine DES (2.8 mm);; RCA 60-70% stenosis planned staged procedure  . PERCUTANEOUS CORONARY STENT INTERVENTION (PCI-S) N/A 11/15/2013   Procedure: PERCUTANEOUS CORONARY STENT INTERVENTION (PCI-S);  Surgeon: Leonie Man, MD;  Location: [facility] CATH LAB;  Service: Cardiovascular;  Patent Cx & LAD Stents; PCI -dRCA Promus Premier DES 4.0 mm x 16 mm (4.25 mm) dRCA, 2.0 mm Cutting PTCA of RPL2 [place] & POBA of mRPDA  . POLYPECTOMY  03/28/2015   Procedure: POLYPECTOMY;  Surgeon: Lucilla Lame, MD;  Location: [place];  Service: Endoscopy;;  . RIGHT HEART CATH  June 2015   Normal pressures with severely reduced cardiac output and index. (3.25/1.55)  .  TRANSTHORACIC ECHOCARDIOGRAM  07/31/2013; February 2017   a. LVEF 45-50. Inferior posterior hypokinesis with mild dilation. Apparent normal diastolic pressures;   . UMBILICAL HERNIA REPAIR N/A 04/02/2015   Procedure: HERNIA REPAIR UMBILICAL ADULT;  Surgeon: Florene Glen, MD;  Location: [facility] ORS;  Service: General;  Laterality: N/A;    Prior to Admission medications   Medication Sig Start Date End Date Taking? Authorizing Provider  albuterol (VENTOLIN HFA) 108 (90 Base) MCG/ACT inhaler INHALE TWO PUFFS BY MOUTH EVERY 6 HOURS AS NEEDED FOR SHORTNESS OF BREATH 06/04/20  Yes Baity, Coralie Keens, NP  clobetasol cream (TEMOVATE) 0.05 % APPLY TOPICALLY TO AFFECTED AREA(S) DAILY AS NEEDED FOR ECZEMA Patient taking differently: Apply 1 application topically daily as needed (eczema). 01/10/18  Yes Jearld Fenton, NP  fenofibrate (TRICOR) 48 MG tablet TAKE ONE TABLET BY MOUTH DAILY 02/11/20  Yes Leonie Man, MD  Fluticasone-Salmeterol (ADVAIR) 100-50 MCG/DOSE AEPB Inhale 1 puff into the lungs in the morning and at bedtime. 06/04/20  Yes Jearld Fenton, NP  Melatonin 1 MG CAPS Take 1 mg by mouth at bedtime as needed (sleep).   Yes [provider]  metoprolol succinate (TOPROL-XL) 25 MG 24 hr tablet TAKE 1.5 TABLETS BY MOUTH EVERY EVENING 06/06/20  Yes Leonie Man, MD  Na Sulfate-K Sulfate-Mg Sulf (SUPREP BOWEL PREP KIT) 17.5-3.13-1.6 GM/177ML SOLN Take 1 kit by mouth as directed. 06/23/20  Yes Lucilla Lame, MD  nitroGLYCERIN (NITROSTAT) 0.4 MG SL tablet Place 1 tablet (0.4 mg total) under the tongue every 5 (five) minutes as needed for chest pain. 01/18/18  Yes Leonie Man, MD  Omega-3 Fatty Acids (FISH OIL) 1000 MG CPDR Take 3 g by mouth daily. 08/17/18  Yes Leonie Man, MD  omeprazole (PRILOSEC) 40 MG capsule Take 1 capsule (40 mg total) by mouth daily. 05/01/20  Yes Leonie Man, MD  prasugrel (EFFIENT) 10 MG TABS tablet Take 1 tablet (10 mg total) by mouth daily. 04/04/20 07/03/20  Yes Buford Dresser, MD  rosuvastatin (CRESTOR) 40 MG tablet TAKE ONE TABLET BY MOUTH DAILY 03/27/20  Yes Leonie Man, MD  fluticasone [facility]) 50 MCG/ACT nasal spray Place 2 sprays into both nostrils daily. Patient taking differently: Place 2 sprays into both nostrils daily as needed (congestion). 05/15/17 09/25/19  Johnn Hai, PA-C    Allergies as of 06/24/2020 - Review Complete 06/24/2020  Allergen Reaction Noted  . Niacin and related Hives and Itching 12/31/2013    Family History  Problem  Relation Age of Onset  . Coronary artery disease Father   . Heart disease Father   . Stroke Father   . Hypertension Father   . Hyperlipidemia Mother   . Diabetes Mother   . Hyperlipidemia Maternal Grandmother   . Diabetes Maternal Grandmother   . Hyperlipidemia Maternal Grandfather   . Diabetes Maternal Grandfather   . Emphysema Maternal Grandfather   . Heart disease Paternal Grandmother   . Heart disease Paternal Grandfather   . Cancer Maternal Aunt        breast    Social History   Socioeconomic History  . Marital status: Married    Spouse name: Not on file  . Number of children: Not on file  . Years of education: Not on file  . Highest education level: Not on file  Occupational History  . Not on file  Tobacco Use  . Smoking status: Current Every Day Smoker    Packs/day: 0.50    Years: 40.00    Pack years: 20.00    Types: Cigarettes    Last attempt to quit: 01/19/2017    Years since quitting: 3.4  . Smokeless tobacco: Never Used  . Tobacco comment: Quit with Bupropion $RemoveBefore'150mg'[id number]$  - > unfortunately, he restarted  Substance and Sexual Activity  . Alcohol use: Yes    Comment: rare  . Drug use: No  . Sexual activity: Not on file  Other Topics Concern  . Not on file  Social History Narrative   He is a married father of 46, grandfather of 48. He does not really get routine exercise.    He is down to 5 or 6 cigarettes a day. He is very seriously wanting to quit. This  is a significant cutback for him, but he has pretty much been told he needs to stop by his wife, and so he fully intends to do so. He is willing to try the patches or whatever. He has a social alcoholic beverage every now and then.    Social Determinants of Health   Financial Resource Strain: Not on file  Food Insecurity: Not on file  Transportation Needs: Not on file  Physical Activity: Not on file  Stress: Not on file  Social Connections: Not on file  Intimate Partner Violence: Not on file    Review of Systems: See HPI, otherwise negative ROS  Physical Exam: Ht 5' 11.5" (1.816 m)   Wt 81.6 kg   BMI 24.76 kg/m  General:   Alert,  pleasant and cooperative in NAD Head:  Normocephalic and atraumatic. Neck:  Supple; no masses or thyromegaly. Lungs:  Clear throughout to auscultation.    Heart:  Regular rate and rhythm. Abdomen:  Soft, nontender and nondistended. Normal bowel sounds, without guarding, and without rebound.   Neurologic:  Alert and  oriented x4;  grossly normal neurologically.  Impression/Plan: Troy Parrish. is here for an endoscopy and colonoscopy to be performed for dyspepsia and a history of adenomatous polyps on 03/2015   Risks, benefits, limitations, and alternatives regarding  endoscopy and colonoscopy have been reviewed with the patient.  Questions have been answered.  All parties agreeable.   Lucilla Lame, MD  06/30/2020, 9:07 AM

## 2020-06-30 NOTE — Transfer of Care (Signed)
Immediate Anesthesia Transfer of Care Note  Patient: Troy Parrish.  Procedure(s) Performed: COLONOSCOPY WITH PROPOFOL (N/A Rectum) ESOPHAGOGASTRODUODENOSCOPY (EGD) WITH PROPOFOL (N/A Esophagus) BIOPSY (N/A Esophagus) POLYPECTOMY (N/A Rectum)  Patient Location: PACU  Anesthesia Type: General  Level of Consciousness: awake, alert  and patient cooperative  Airway and Oxygen Therapy: Patient Spontanous Breathing and Patient connected to supplemental oxygen  Post-op Assessment: Post-op Vital signs reviewed, Patient's Cardiovascular Status Stable, Respiratory Function Stable, Patent Airway and No signs of Nausea or vomiting  Post-op Vital Signs: Reviewed and stable  Complications: No complications documented.

## 2020-07-01 ENCOUNTER — Encounter: Payer: Self-pay | Admitting: Gastroenterology

## 2020-07-02 DIAGNOSIS — L858 Other specified epidermal thickening: Secondary | ICD-10-CM | POA: Diagnosis not present

## 2020-07-02 DIAGNOSIS — D2261 Melanocytic nevi of right upper limb, including shoulder: Secondary | ICD-10-CM | POA: Diagnosis not present

## 2020-07-02 DIAGNOSIS — D2271 Melanocytic nevi of right lower limb, including hip: Secondary | ICD-10-CM | POA: Diagnosis not present

## 2020-07-02 DIAGNOSIS — L821 Other seborrheic keratosis: Secondary | ICD-10-CM | POA: Diagnosis not present

## 2020-07-02 DIAGNOSIS — D2262 Melanocytic nevi of left upper limb, including shoulder: Secondary | ICD-10-CM | POA: Diagnosis not present

## 2020-07-02 DIAGNOSIS — D2272 Melanocytic nevi of left lower limb, including hip: Secondary | ICD-10-CM | POA: Diagnosis not present

## 2020-07-02 DIAGNOSIS — B360 Pityriasis versicolor: Secondary | ICD-10-CM | POA: Diagnosis not present

## 2020-07-03 LAB — SURGICAL PATHOLOGY

## 2020-07-23 ENCOUNTER — Telehealth: Payer: Self-pay

## 2020-07-23 ENCOUNTER — Other Ambulatory Visit: Payer: Self-pay

## 2020-07-23 NOTE — Telephone Encounter (Signed)
Pt contacted and scheduled for a follow up EGD result appt.

## 2020-07-23 NOTE — Telephone Encounter (Signed)
-----   Message from Lucilla Lame, MD sent at 07/05/2020  6:17 AM EDT ----- Please have the patient come in for a follow up and repeat colonoscopy in 5 years

## 2020-08-13 ENCOUNTER — Ambulatory Visit: Payer: PPO | Admitting: Gastroenterology

## 2020-08-13 ENCOUNTER — Other Ambulatory Visit: Payer: Self-pay

## 2020-08-13 ENCOUNTER — Encounter: Payer: Self-pay | Admitting: Gastroenterology

## 2020-08-13 VITALS — BP 86/55 | HR 81 | Temp 97.0°F | Ht 70.0 in | Wt 176.0 lb

## 2020-08-13 DIAGNOSIS — K31A0 Gastric intestinal metaplasia, unspecified: Secondary | ICD-10-CM | POA: Diagnosis not present

## 2020-08-13 NOTE — H&P (View-Only) (Signed)
Primary Care Physician: Jearld Fenton, NP  Primary Gastroenterologist:  Dr. Lucilla Lame  Chief Complaint  Patient presents with   Follow up EGD results    HPI: Troy Parrish. is a 60 y.o. male here for follow-up after having an EGD and colonoscopy.  The patient's pathology from the EGD and colonoscopy showed:  Specimen Submitted:  A. Stomach, antrum; cbx  B. Colon polyp, ascending; cold snare  C. Colon polyp x2, transverse; cold snare  D. Colon polyp, descending; cold snare  Clinical History: HX of colon polyps Z86.010 dyspepsia R10.13.  Small  hiatal hernia, colon polyps   DIAGNOSIS:  A. STOMACH, ANTRUM; COLD BIOPSY:  - CHRONIC ACTIVE GASTRITIS WITH INTESTINAL METAPLASIA.  - NEGATIVE FOR HELICOBACTER PYLORI BY IMMUNOHISTOCHEMISTRY.  - NEGATIVE FOR DYSPLASIA AND MALIGNANCY.   B. COLON POLYP, ASCENDING; COLD SNARE:  - TUBULAR ADENOMA.  - NEGATIVE FOR HIGH-GRADE DYSPLASIA AND MALIGNANCY.   C. COLON POLYP X2, TRANSVERSE; COLD SNARE:  - COLONIC MUCOSA WITH INTRAMUCOSAL LYMPHOID AGGREGATES.  - NEGATIVE FOR DYSPLASIA AND MALIGNANCY.   D. COLON POLYP, DESCENDING; COLD SNARE:  - TUBULAR ADENOMA.  - NEGATIVE FOR HIGH-GRADE DYSPLASIA AND MALIGNANCY.   The patient was recommended to have a repeat colonoscopy in 5 years and was asked to come in to discuss the findings of gastric intestinal metaplasia.  The patient reports that he has had trouble with his stomach for sometime and also reports that he has been having problems with his hemorrhoids.  The patient reports them to be swollen and tender.  Past Medical History:  Diagnosis Date   Bronchitis    Gets bronchitis almost every year   CAD S/P percutaneous coronary angioplasty 06/2011; 6/ & 11/2013   S/P PCI to all 3 major vessels;a)  Ant STEMI 2001- BMS-LAD x2 -->(redo PCI 6/'15 -- pLAD Xience DES 2.5 x 12- 2.75 mm, mLAD 2.25 x 12 - 2.7 mm), b) '06 UA --> Cx- OM DES; c) 2013 Inf MI BMS mRCA --> d) 10/'15 PCI dRCA Promus P  DES 4.0 x 16 (4.25 mm); PTCA of RPL2 (2.0 mm) &RPDA (2.25 mm) 11/2013; 4/18 PCI OM1 Synergy DES  2.5 x 16   COPD (chronic obstructive pulmonary disease) (HCC)    Elevated WBC count    Emphysema of lung ([place])    Essential hypertension 07/07/2011   Former heavy tobacco smoker     quit in May 2015 after multiple attempts at trying to quit before    GERD (gastroesophageal reflux disease)    Glucose intolerance (pre-diabetes) June 2015    hemoglobin A1c 6.6   Headache    "Imdur related; stopped taking it; headaches went away" (11/15/2013)   Heart murmur    History of colon polyps    History of stomach ulcers    Hyperlipidemia with target LDL less than 70 05/07/5186   Metabolic syndrome    Pre-diabetes, hypertension and truncal obesity as well as dyslipidemia   OSA on CPAP    Pneumonia "several times"   Recurrent upper respiratory infection (URI)    Seizures ([place]) "several"   "last one was ~ 2011" (11/15/2013)   Shortness of breath dyspnea    ST elevation myocardial infarction (STEMI) of anterior wall ([place]) 2001   History of -- 2 stents in early and distal mid LAD; prior cardiologist was Dr. Remi Haggard in [place] elevation myocardial infarction (STEMI) of inferior wall ([place]) 07/07/2011   Occluded RCA 2.75X18 INTEGRITY; Echo 07/2013: EF 45-50%,  Inferior & Posterolateral HK.    Umbilical hernia    Unstable angina ([place]) 2006; 07/2013   Cx-OM - PCI 2.5 mm 13 mm Cypher DES [name]) ; 07/2013: Severe ISR of both prox & Distal LAD stentS --> 2 DES stents (1 at prox edge of the proximal stent, 2nd covers the entire distal stent as well as proximal and distal edge stenose)    Upper respiratory infection     Current Outpatient Medications  Medication Sig Dispense Refill   albuterol (VENTOLIN HFA) 108 (90 Base) MCG/ACT inhaler INHALE TWO PUFFS BY MOUTH EVERY 6 HOURS AS NEEDED FOR SHORTNESS OF BREATH 24 g 1   clobetasol cream (TEMOVATE) 0.05 % APPLY TOPICALLY TO AFFECTED AREA(S) DAILY  AS NEEDED FOR ECZEMA (Patient taking differently: Apply 1 application topically daily as needed (eczema).) 30 g 4   EFFIENT 10 MG TABS tablet Take 10 mg by mouth daily.     fenofibrate (TRICOR) 48 MG tablet TAKE ONE TABLET BY MOUTH DAILY 90 tablet 3   Fluticasone-Salmeterol (ADVAIR) 100-50 MCG/DOSE AEPB Inhale 1 puff into the lungs in the morning and at bedtime. 180 each 1   ketoconazole (NIZORAL) 2 % shampoo Apply 1 application topically 2 (two) times a week.     Melatonin 1 MG CAPS Take 1 mg by mouth at bedtime as needed (sleep).     metoprolol succinate (TOPROL-XL) 25 MG 24 hr tablet TAKE 1.5 TABLETS BY MOUTH EVERY EVENING 145 tablet 3   Na Sulfate-K Sulfate-Mg Sulf (SUPREP BOWEL PREP KIT) 17.5-3.13-1.6 GM/177ML SOLN Take 1 kit by mouth as directed. 354 mL 0   nitroGLYCERIN (NITROSTAT) 0.4 MG SL tablet Place 1 tablet (0.4 mg total) under the tongue every 5 (five) minutes as needed for chest pain. 25 tablet 3   Omega-3 Fatty Acids (FISH OIL) 1000 MG CPDR Take 3 g by mouth daily. 30 capsule 11   omeprazole (PRILOSEC) 40 MG capsule Take 1 capsule (40 mg total) by mouth daily. 90 capsule 3   rosuvastatin (CRESTOR) 40 MG tablet TAKE ONE TABLET BY MOUTH DAILY 90 tablet 3   fluticasone (FLONASE) 50 MCG/ACT nasal spray Place 2 sprays into both nostrils daily. (Patient taking differently: Place 2 sprays into both nostrils daily as needed (congestion).) 16 g 0   No current facility-administered medications for this visit.    Allergies as of 08/13/2020 - Review Complete 08/13/2020  Allergen Reaction Noted   Niacin and related Hives and Itching 12/31/2013    ROS:  General: Negative for anorexia, weight loss, fever, chills, fatigue, weakness. ENT: Negative for hoarseness, difficulty swallowing , nasal congestion. CV: Negative for chest pain, angina, palpitations, dyspnea on exertion, peripheral edema.  Respiratory: Negative for dyspnea at rest, dyspnea on exertion, cough, sputum, wheezing.  GI: See  history of present illness. GU:  Negative for dysuria, hematuria, urinary incontinence, urinary frequency, nocturnal urination.  Endo: Negative for unusual weight change.    Physical Examination:   BP (!) 86/55 (BP Location: Right Arm, Patient Position: Sitting, Cuff Size: Large)   Pulse 81   Temp (!) 97 F (36.1 C) (Temporal)   Ht _0  (1.778 m)   Wt 176 lb (79.8 kg)   BMI 25.25 kg/m   General: Well-nourished, well-developed in no acute distress.  Eyes: No icterus. Conjunctivae pink. Neuro: Alert and oriented x 3.  Grossly intact. Skin: Warm and dry, no jaundice.   Psych: Alert and cooperative, normal mood and affect.  Labs:    Imaging Studies: No results  found.  Assessment and Plan:   Troy Ursin. is a 60 y.o. y/o male who comes in today with a history of having an EGD and colonoscopy with a polyp found in the colon with a repeat colonoscopy required in 5 years.  The patient has been informed of this.  The patient has also been told about his gastric intestinal metaplasia the patient will come in for repeat EGD for gastric mapping of his stomach.  He has also been told that he'll need a repeat upper endoscopy on average every 3 years to make sure that his gastrointestinal metaplasia does not progress.  The patient will also be sent to one of my partners for hemorrhoidal banding.  The patient has been explained the plan and agrees with it.     Lucilla Lame, MD. Marval Regal    Note: This dictation was prepared with Dragon dictation along with smaller phrase technology. Any transcriptional errors that result from this process are unintentional.

## 2020-08-13 NOTE — Progress Notes (Signed)
  Primary Care Physician: Baity, Regina W, NP  Primary Gastroenterologist:  Dr. Sayler Mickiewicz  Chief Complaint  Patient presents with   Follow up EGD results    HPI: Troy H Manley Jr. is a 60 y.o. male here for follow-up after having an EGD and colonoscopy.  The patient's pathology from the EGD and colonoscopy showed:  Specimen Submitted:  A. Stomach, antrum; cbx  B. Colon polyp, ascending; cold snare  C. Colon polyp x2, transverse; cold snare  D. Colon polyp, descending; cold snare  Clinical History: HX of colon polyps Z86.010 dyspepsia R10.13.  Small  hiatal hernia, colon polyps   DIAGNOSIS:  A. STOMACH, ANTRUM; COLD BIOPSY:  - CHRONIC ACTIVE GASTRITIS WITH INTESTINAL METAPLASIA.  - NEGATIVE FOR HELICOBACTER PYLORI BY IMMUNOHISTOCHEMISTRY.  - NEGATIVE FOR DYSPLASIA AND MALIGNANCY.   B. COLON POLYP, ASCENDING; COLD SNARE:  - TUBULAR ADENOMA.  - NEGATIVE FOR HIGH-GRADE DYSPLASIA AND MALIGNANCY.   C. COLON POLYP X2, TRANSVERSE; COLD SNARE:  - COLONIC MUCOSA WITH INTRAMUCOSAL LYMPHOID AGGREGATES.  - NEGATIVE FOR DYSPLASIA AND MALIGNANCY.   D. COLON POLYP, DESCENDING; COLD SNARE:  - TUBULAR ADENOMA.  - NEGATIVE FOR HIGH-GRADE DYSPLASIA AND MALIGNANCY.   The patient was recommended to have a repeat colonoscopy in 5 years and was asked to come in to discuss the findings of gastric intestinal metaplasia.  The patient reports that he has had trouble with his stomach for sometime and also reports that he has been having problems with his hemorrhoids.  The patient reports them to be swollen and tender.  Past Medical History:  Diagnosis Date   Bronchitis    Gets bronchitis almost every year   CAD S/P percutaneous coronary angioplasty 06/2011; 6/ & 11/2013   S/P PCI to all 3 major vessels;a)  Ant STEMI 2001- BMS-LAD x2 -->(redo PCI 6/'15 -- pLAD Xience DES 2.5 x 12- 2.75 mm, mLAD 2.25 x 12 - 2.7 mm), b) '06 UA --> Cx- OM DES; c) 2013 Inf MI BMS mRCA --> d) 10/'15 PCI dRCA Promus P  DES 4.0 x 16 (4.25 mm); PTCA of RPL2 (2.0 mm) &RPDA (2.25 mm) 11/2013; 4/18 PCI OM1 Synergy DES  2.5 x 16   COPD (chronic obstructive pulmonary disease) (HCC)    Elevated WBC count    Emphysema of lung (HCC)    Essential hypertension 07/07/2011   Former heavy tobacco smoker     quit in May 2015 after multiple attempts at trying to quit before    GERD (gastroesophageal reflux disease)    Glucose intolerance (pre-diabetes) June 2015    hemoglobin A1c 6.6   Headache    "Imdur related; stopped taking it; headaches went away" (11/15/2013)   Heart murmur    History of colon polyps    History of stomach ulcers    Hyperlipidemia with target LDL less than 70 07/07/2011   Metabolic syndrome    Pre-diabetes, hypertension and truncal obesity as well as dyslipidemia   OSA on CPAP    Pneumonia "several times"   Recurrent upper respiratory infection (URI)    Seizures (HCC) "several"   "last one was ~ 2011" (11/15/2013)   Shortness of breath dyspnea    ST elevation myocardial infarction (STEMI) of anterior wall (HCC) 2001   History of -- 2 stents in early and distal mid LAD; prior cardiologist was Dr. Jose Jacobs in [place],   ST elevation myocardial infarction (STEMI) of inferior wall (HCC) 07/07/2011   Occluded RCA 2.75X18 INTEGRITY; Echo 07/2013: EF 45-50%,   Inferior & Posterolateral HK.    Umbilical hernia    Unstable angina (HCC) 2006; 07/2013   Cx-OM - PCI 2.5 mm 13 mm Cypher DES ([place], [place]) ; 07/2013: Severe ISR of both prox & Distal LAD stentS --> 2 DES stents (1 at prox edge of the proximal stent, 2nd covers the entire distal stent as well as proximal and distal edge stenose)    Upper respiratory infection     Current Outpatient Medications  Medication Sig Dispense Refill   albuterol (VENTOLIN HFA) 108 (90 Base) MCG/ACT inhaler INHALE TWO PUFFS BY MOUTH EVERY 6 HOURS AS NEEDED FOR SHORTNESS OF BREATH 24 g 1   clobetasol cream (TEMOVATE) 0.05 % APPLY TOPICALLY TO AFFECTED AREA(S) DAILY  AS NEEDED FOR ECZEMA (Patient taking differently: Apply 1 application topically daily as needed (eczema).) 30 g 4   EFFIENT 10 MG TABS tablet Take 10 mg by mouth daily.     fenofibrate (TRICOR) 48 MG tablet TAKE ONE TABLET BY MOUTH DAILY 90 tablet 3   Fluticasone-Salmeterol (ADVAIR) 100-50 MCG/DOSE AEPB Inhale 1 puff into the lungs in the morning and at bedtime. 180 each 1   ketoconazole (NIZORAL) 2 % shampoo Apply 1 application topically 2 (two) times a week.     Melatonin 1 MG CAPS Take 1 mg by mouth at bedtime as needed (sleep).     metoprolol succinate (TOPROL-XL) 25 MG 24 hr tablet TAKE 1.5 TABLETS BY MOUTH EVERY EVENING 145 tablet 3   Na Sulfate-K Sulfate-Mg Sulf (SUPREP BOWEL PREP KIT) 17.5-3.13-1.6 GM/177ML SOLN Take 1 kit by mouth as directed. 354 mL 0   nitroGLYCERIN (NITROSTAT) 0.4 MG SL tablet Place 1 tablet (0.4 mg total) under the tongue every 5 (five) minutes as needed for chest pain. 25 tablet 3   Omega-3 Fatty Acids (FISH OIL) 1000 MG CPDR Take 3 g by mouth daily. 30 capsule 11   omeprazole (PRILOSEC) 40 MG capsule Take 1 capsule (40 mg total) by mouth daily. 90 capsule 3   rosuvastatin (CRESTOR) 40 MG tablet TAKE ONE TABLET BY MOUTH DAILY 90 tablet 3   fluticasone (FLONASE) 50 MCG/ACT nasal spray Place 2 sprays into both nostrils daily. (Patient taking differently: Place 2 sprays into both nostrils daily as needed (congestion).) 16 g 0   No current facility-administered medications for this visit.    Allergies as of 08/13/2020 - Review Complete 08/13/2020  Allergen Reaction Noted   Niacin and related Hives and Itching 12/31/2013    ROS:  General: Negative for anorexia, weight loss, fever, chills, fatigue, weakness. ENT: Negative for hoarseness, difficulty swallowing , nasal congestion. CV: Negative for chest pain, angina, palpitations, dyspnea on exertion, peripheral edema.  Respiratory: Negative for dyspnea at rest, dyspnea on exertion, cough, sputum, wheezing.  GI: See  history of present illness. GU:  Negative for dysuria, hematuria, urinary incontinence, urinary frequency, nocturnal urination.  Endo: Negative for unusual weight change.    Physical Examination:   BP (!) 86/55 (BP Location: Right Arm, Patient Position: Sitting, Cuff Size: Large)   Pulse 81   Temp (!) 97 F (36.1 C) (Temporal)   Ht 5' 10" (1.778 m)   Wt 176 lb (79.8 kg)   BMI 25.25 kg/m   General: Well-nourished, well-developed in no acute distress.  Eyes: No icterus. Conjunctivae pink. Neuro: Alert and oriented x 3.  Grossly intact. Skin: Warm and dry, no jaundice.   Psych: Alert and cooperative, normal mood and affect.  Labs:    Imaging Studies: No results   found.  Assessment and Plan:   Troy H Cobaugh Jr. is a 60 y.o. y/o male who comes in today with a history of having an EGD and colonoscopy with a polyp found in the colon with a repeat colonoscopy required in 5 years.  The patient has been informed of this.  The patient has also been told about his gastric intestinal metaplasia the patient will come in for repeat EGD for gastric mapping of his stomach.  He has also been told that he'll need a repeat upper endoscopy on average every 3 years to make sure that his gastrointestinal metaplasia does not progress.  The patient will also be sent to one of my partners for hemorrhoidal banding.  The patient has been explained the plan and agrees with it.     Dynisha Due, MD. FACG    Note: This dictation was prepared with Dragon dictation along with smaller phrase technology. Any transcriptional errors that result from this process are unintentional.   

## 2020-09-02 ENCOUNTER — Encounter: Payer: Self-pay | Admitting: Gastroenterology

## 2020-09-11 ENCOUNTER — Ambulatory Visit: Payer: PPO | Admitting: Anesthesiology

## 2020-09-11 ENCOUNTER — Ambulatory Visit
Admission: RE | Admit: 2020-09-11 | Discharge: 2020-09-11 | Disposition: A | Discharge status: Home / Self Care | Payer: PPO | Attending: Gastroenterology | Admitting: Gastroenterology

## 2020-09-11 ENCOUNTER — Encounter
Admission: RE | Disposition: A | Discharge status: Home / Self Care | Payer: Self-pay | Source: Home / Self Care | Attending: Gastroenterology

## 2020-09-11 ENCOUNTER — Encounter: Payer: Self-pay | Admitting: Gastroenterology

## 2020-09-11 ENCOUNTER — Other Ambulatory Visit: Payer: Self-pay

## 2020-09-11 DIAGNOSIS — Z86010 Personal history of colonic polyps: Secondary | ICD-10-CM | POA: Insufficient documentation

## 2020-09-11 DIAGNOSIS — K2950 Unspecified chronic gastritis without bleeding: Secondary | ICD-10-CM | POA: Insufficient documentation

## 2020-09-11 DIAGNOSIS — Z7951 Long term (current) use of inhaled steroids: Secondary | ICD-10-CM | POA: Diagnosis not present

## 2020-09-11 DIAGNOSIS — K31A19 Gastric intestinal metaplasia without dysplasia, unspecified site: Secondary | ICD-10-CM | POA: Diagnosis present

## 2020-09-11 DIAGNOSIS — Z79899 Other long term (current) drug therapy: Secondary | ICD-10-CM | POA: Diagnosis not present

## 2020-09-11 DIAGNOSIS — K31A0 Gastric intestinal metaplasia, unspecified: Secondary | ICD-10-CM

## 2020-09-11 DIAGNOSIS — K3189 Other diseases of stomach and duodenum: Secondary | ICD-10-CM | POA: Diagnosis not present

## 2020-09-11 DIAGNOSIS — Z87891 Personal history of nicotine dependence: Secondary | ICD-10-CM | POA: Diagnosis not present

## 2020-09-11 DIAGNOSIS — Z955 Presence of coronary angioplasty implant and graft: Secondary | ICD-10-CM | POA: Insufficient documentation

## 2020-09-11 DIAGNOSIS — K31A11 Gastric intestinal metaplasia without dysplasia, involving the antrum: Secondary | ICD-10-CM | POA: Insufficient documentation

## 2020-09-11 DIAGNOSIS — K449 Diaphragmatic hernia without obstruction or gangrene: Secondary | ICD-10-CM | POA: Diagnosis not present

## 2020-09-11 HISTORY — PX: ESOPHAGOGASTRODUODENOSCOPY (EGD) WITH PROPOFOL: SHX5813

## 2020-09-11 SURGERY — ESOPHAGOGASTRODUODENOSCOPY (EGD) WITH PROPOFOL
Anesthesia: General | Site: Throat

## 2020-09-11 MED ORDER — LIDOCAINE HCL (CARDIAC) PF 100 MG/5ML IV SOSY
PREFILLED_SYRINGE | INTRAVENOUS | Status: DC | PRN
  Administered 2020-09-11: 50 mg via INTRAVENOUS

## 2020-09-11 MED ORDER — SODIUM CHLORIDE 0.9 % IV SOLN: INTRAVENOUS | Status: DC

## 2020-09-11 MED ORDER — ONDANSETRON HCL 4 MG/2ML IJ SOLN: 4.0000 mg | Freq: Once | INTRAMUSCULAR | Status: DC | PRN

## 2020-09-11 MED ORDER — ACETAMINOPHEN 325 MG PO TABS: 325.0000 mg | ORAL_TABLET | ORAL | Status: DC | PRN

## 2020-09-11 MED ORDER — ACETAMINOPHEN 160 MG/5ML PO SOLN: 325.0000 mg | ORAL | Status: DC | PRN

## 2020-09-11 MED ORDER — PROPOFOL 10 MG/ML IV BOLUS
INTRAVENOUS | Status: DC | PRN
  Administered 2020-09-11: 20 mg via INTRAVENOUS
  Administered 2020-09-11: 70 mg via INTRAVENOUS
  Administered 2020-09-11: 20 mg via INTRAVENOUS
  Administered 2020-09-11: 40 mg via INTRAVENOUS

## 2020-09-11 MED ORDER — GLYCOPYRROLATE 0.2 MG/ML IJ SOLN
INTRAMUSCULAR | Status: DC | PRN
  Administered 2020-09-11: .1 mg via INTRAVENOUS

## 2020-09-11 MED ORDER — LACTATED RINGERS IV SOLN: INTRAVENOUS | Status: DC

## 2020-09-11 SURGICAL SUPPLY — 8 items
BLOCK BITE 60FR ADLT L/F GRN (MISCELLANEOUS) ×2 IMPLANT
FORCEPS BIOP RAD 4 LRG CAP 4 (CUTTING FORCEPS) ×2 IMPLANT
GOWN CVR UNV OPN BCK APRN NK (MISCELLANEOUS) ×2 IMPLANT
GOWN ISOL THUMB LOOP REG UNIV (MISCELLANEOUS) ×4
KIT PRC NS LF DISP ENDO (KITS) ×1 IMPLANT
KIT PROCEDURE OLYMPUS (KITS) ×2
MANIFOLD NEPTUNE II (INSTRUMENTS) ×2 IMPLANT
WATER STERILE IRR 250ML POUR (IV SOLUTION) ×2 IMPLANT

## 2020-09-11 NOTE — Transfer of Care (Signed)
Immediate Anesthesia Transfer of Care Note  Patient: Troy Parrish.  Procedure(s) Performed: ESOPHAGOGASTRODUODENOSCOPY (EGD) WITH PROPOFOL (Throat)  Patient Location: PACU  Anesthesia Type: General  Level of Consciousness: awake, alert  and patient cooperative  Airway and Oxygen Therapy: Patient Spontanous Breathing and Patient connected to supplemental oxygen  Post-op Assessment: Post-op Vital signs reviewed, Patient's Cardiovascular Status Stable, Respiratory Function Stable, Patent Airway and No signs of Nausea or vomiting  Post-op Vital Signs: Reviewed and stable  Complications: No notable events documented.

## 2020-09-11 NOTE — Anesthesia Postprocedure Evaluation (Signed)
Anesthesia Post Note  Patient: Troy Parrish.  Procedure(s) Performed: ESOPHAGOGASTRODUODENOSCOPY (EGD) WITH PROPOFOL (Throat)     Patient location during evaluation: PACU Anesthesia Type: General Level of consciousness: awake Pain management: pain level controlled Vital Signs Assessment: post-procedure vital signs reviewed and stable Respiratory status: respiratory function stable Cardiovascular status: stable Postop Assessment: no signs of nausea or vomiting Anesthetic complications: no   No notable events documented.  Veda Canning

## 2020-09-11 NOTE — Op Note (Signed)
[facility] Patient Name: Troy Parrish Procedure Date: 09/11/2020 7:59 AM MRN: [id number] Account #: [id number] Date of Birth: 06/20/1960 Admit Type: Outpatient Age: 60 Room: [facility] OR ROOM 01 Gender: Male Note Status: Finalized Procedure:             Upper GI endoscopy Indications:           Intestinal metaplasia Providers:             Lucilla Lame MD, MD Referring MD:          Jearld Fenton (Referring MD) Medicines:             Propofol per Anesthesia Complications:         No immediate complications. Procedure:             Pre-Anesthesia Assessment:                        - Prior to the procedure, a History and Physical was                         performed, and patient medications and allergies were                         reviewed. The patient's tolerance of previous                         anesthesia was also reviewed. The risks and benefits                         of the procedure and the sedation options and risks                         were discussed with the patient. All questions were                         answered, and informed consent was obtained. Prior                         Anticoagulants: The patient has taken no previous                         anticoagulant or antiplatelet agents. ASA Grade                         Assessment: II - A patient with mild systemic disease.                         After reviewing the risks and benefits, the patient                         was deemed in satisfactory condition to undergo the                         procedure.                        After obtaining informed consent, the endoscope was  passed under direct vision. Throughout the procedure,                         the patient's blood pressure, pulse, and oxygen                         saturations were monitored continuously. The Endoscope                         was introduced through the mouth, and advanced to the                          second part of duodenum. The upper GI endoscopy was                         accomplished without difficulty. The patient tolerated                         the procedure well. Findings:      A small hiatal hernia was present.      A large amount of food (residue) was found in the gastric body.      Biopsies were taken with a cold forceps on the greater curvature of the       stomach, on the lesser curvature of the stomach, at the incisura and in       the gastric antrum for histology.      The examined duodenum was normal. Impression:            - Small hiatal hernia.                        - A large amount of food (residue) in the stomach.                        - Normal examined duodenum.                        - Biopsies were taken with a cold forceps for                         histology on the greater curvature of the stomach, on                         the lesser curvature of the stomach, at the incisura                         and in the gastric antrum. Recommendation:        - Discharge patient to home.                        - Resume previous diet.                        - Continue present medications.                        - Await pathology results.                        -  Repeat upper endoscopy in 3 years for surveillance. Procedure Code(s):     --- Professional ---                        [phone], Esophagogastroduodenoscopy, flexible,                         transoral; with biopsy, single or multiple Diagnosis Code(s):     --- Professional ---                        K31.89, Other diseases of stomach and duodenum CPT copyright 2019 [employer]. All rights reserved. The codes documented in this report are preliminary and upon coder review may  be revised to meet current compliance requirements. Lucilla Lame MD, MD 09/11/2020 8:21:29 AM This report has been signed electronically. Number of Addenda: 0 Note Initiated On: 09/11/2020 7:59  AM Total Procedure Duration: 0 hours 4 minutes 12 seconds  Estimated Blood Loss:  Estimated blood loss: none.      [facility]

## 2020-09-11 NOTE — Anesthesia Procedure Notes (Signed)
Date/Time: 09/11/2020 8:11 AM Performed by: Mayme Genta, CRNA Pre-anesthesia Checklist: Patient identified, Emergency Drugs available, Suction available, Timeout performed and Patient being monitored Patient Re-evaluated:Patient Re-evaluated prior to induction Oxygen Delivery Method: Nasal cannula Placement Confirmation: positive ETCO2

## 2020-09-11 NOTE — Interval H&P Note (Signed)
Midge Minium, MD [facility] [address] [place], [place] [address] Phone:[phone] Fax : [phone]  Primary Care Physician:  Lorre Munroe, NP Primary Gastroenterologist:  Dr. Servando Snare  Pre-Procedure History & Physical: HPI:  Troy Parrish. is a 60 y.o. male is here for an endoscopy.   Past Medical History:  Diagnosis Date   Bronchitis    Gets bronchitis almost every year   CAD S/P percutaneous coronary angioplasty 06/2011; 6/ & 11/2013   S/P PCI to all 3 major vessels;a)  Ant STEMI 2001- BMS-LAD x2 -->(redo PCI 6/'15 -- pLAD Xience DES 2.5 x 12- 2.75 mm, mLAD 2.25 x 12 - 2.7 mm), b) '06 UA --> Cx- OM DES; c) 2013 Inf MI BMS mRCA --> d) 10/'15 PCI dRCA Promus P DES 4.0 x 16 (4.25 mm); PTCA of RPL2 (2.0 mm) &RPDA (2.25 mm) 11/2013; 4/18 PCI OM1 Synergy DES  2.5 x 16   COPD (chronic obstructive pulmonary disease) (HCC)    Elevated WBC count    Emphysema of lung (HCC)    Essential hypertension 07/07/2011   Former heavy tobacco smoker     quit in May 2015 after multiple attempts at trying to quit before    GERD (gastroesophageal reflux disease)    Glucose intolerance (pre-diabetes) June 2015    hemoglobin A1c 6.6   Headache    "Imdur related; stopped taking it; headaches went away" (11/15/2013)   Heart murmur    History of colon polyps    History of stomach ulcers    Hyperlipidemia with target LDL less than 70 07/07/2011   Metabolic syndrome    Pre-diabetes, hypertension and truncal obesity as well as dyslipidemia   OSA on CPAP    Pneumonia "several times"   Recurrent upper respiratory infection (URI)    Seizures (HCC) "several"   "last one was ~ 2011" (11/15/2013)   Shortness of breath dyspnea    ST elevation myocardial infarction (STEMI) of anterior wall (HCC) 2001   History of -- 2 stents in early and distal mid LAD; prior cardiologist was Dr. Derwood Kaplan in [place]   ST elevation myocardial infarction (STEMI) of inferior wall (HCC) 07/07/2011   Occluded RCA  2.75X18 INTEGRITY; Echo 07/2013: EF 45-50%, Inferior & Posterolateral HK.    Umbilical hernia    Unstable angina (HCC) 2006; 07/2013   Cx-OM - PCI 2.5 mm 13 mm Cypher DES Jinny Blossom, [place]) ; 07/2013: Severe ISR of both prox & Distal LAD stentS --> 2 DES stents (1 at prox edge of the proximal stent, 2nd covers the entire distal stent as well as proximal and distal edge stenose)    Upper respiratory infection     Past Surgical History:  Procedure Laterality Date   BIOPSY N/A 06/30/2020   Procedure: BIOPSY;  Surgeon: Midge Minium, MD;  Location: [place] [facility];  Service: Endoscopy;  Laterality: N/A;   Cardiac MRI [facility]  09/2014   EF 51%. Mod HK of basal-mid Inf wall, mild HK of basal inferoseptum & basal-mid inferolateral wall with hyper enhancement (c/w subendocard RCA MI with significant viability), focal hyper enhancement of apical wall c/w dLAD subendocard MI & complete LAD viability. . No aortic stenosis. Normal RV function. No Ischemia on Adenosine Stress.   COLONOSCOPY WITH PROPOFOL N/A 03/28/2015   Procedure: COLONOSCOPY WITH PROPOFOL;  Surgeon: Midge Minium, MD;  Location: [facility];  Service: Endoscopy;  Laterality: N/A;   COLONOSCOPY WITH PROPOFOL N/A 06/30/2020   Procedure: COLONOSCOPY WITH PROPOFOL;  Surgeon:  Lucilla Lame, MD;  Location: [place];  Service: Endoscopy;  Laterality: N/A;   CORONARY ANGIOPLASTY WITH STENT PLACEMENT  2001   ANTERIOR mi with a  PCI to LAD; [place], [place] - Dr. Dorris Fetch   CORONARY ANGIOPLASTY WITH STENT PLACEMENT  2006   [place] by Dr Dorris Fetch -lesion lft circ/OM1 with 2.5 x 34mm Cypher DES   CORONARY STENT INTERVENTION N/A 05/19/2016   Procedure: Coronary Stent Intervention;  Surgeon: Leonie Man, MD;  Location: [place] CV LAB;  Service: Cardiovascular: PCI pOM1 - Synergy DES 2.5 x 16 (overlaps old stent)   ESOPHAGOGASTRODUODENOSCOPY (EGD) WITH PROPOFOL N/A 03/28/2015   Procedure: ESOPHAGOGASTRODUODENOSCOPY (EGD) WITH PROPOFOL;   Surgeon: Lucilla Lame, MD;  Location: [place];  Service: Endoscopy;  Laterality: N/A;   ESOPHAGOGASTRODUODENOSCOPY (EGD) WITH PROPOFOL N/A 06/30/2020   Procedure: ESOPHAGOGASTRODUODENOSCOPY (EGD) WITH PROPOFOL;  Surgeon: Lucilla Lame, MD;  Location: [place];  Service: Endoscopy;  Laterality: N/A;   INSERTION OF MESH N/A 04/02/2015   Procedure: INSERTION OF MESH;  Surgeon: Florene Glen, MD;  Location: [facility] ORS;  Service: General;  Laterality: N/A;   LEFT AND RIGHT HEART CATHETERIZATION WITH CORONARY ANGIOGRAM N/A 07/30/2013   Procedure: LEFT AND RIGHT HEART CATHETERIZATION WITH CORONARY ANGIOGRAM;  Surgeon: Leonie Man, MD;  Location: [facility] CATH LAB;  Service: Cardiovascular;  2 separate P & mLAD lesions -> PCI, MOd-Severe dRCA disesase with diffuse RPL/PDA disease (FFR)   LEFT HEART CATH  08/03/2013   Procedure: LEFT HEART CATH;  Surgeon: Leonie Man, MD;  Location: [facility] CATH LAB;  Service: Cardiovascular;;FFR of RCA - non-flow-limiting   LEFT HEART CATH AND CORONARY ANGIOGRAPHY N/A 05/19/2016   Procedure: Left Heart Cath and Coronary Angiography;  Surgeon: Leonie Man, MD;  Location: [place] CV LAB;  Service: Cardiovascular: CULPRIT - 85% pOM1 pre-stent. Stents in both proximal and distal RCA widely patent. PTCA sites in RPL & PDA widely patent with stable 70% small RPL 2. Extensive stenting in the LAD widely patent stents with mild to mod Dz btw prox & mid-distal stents.    LEFT HEART CATH AND CORONARY ANGIOGRAPHY N/A 02/13/2018   Procedure: LEFT HEART CATH AND CORONARY ANGIOGRAPHY;  Surgeon: Leonie Man, MD;  Location: [place] CV LAB;  Service: Cardiovascular: Similar findings to last cath post PCI.  Stents are patent.  Roughly 50% proximal LAD lesion noted, may be progression from prior cath but otherwise stable disease.   LEFT HEART CATHETERIZATION WITH CORONARY ANGIOGRAM N/A 07/07/2011   Procedure: LEFT HEART CATHETERIZATION WITH CORONARY ANGIOGRAM;   Surgeon: Leonie Man, MD;  Location: [facility] CATH LAB;  Service: Cardiovascular;  inferolat post LV infarct ST-elevation MI   MET/CPET  11/09/2011   submax. effort 1.05 RER peak V02 was 51% ,chronotropic incomp.hrt rate lows 80s to 100   MET/CPET  September 2016   [facility]: Mild functional impairment due primarily to mild pulmonary and circulatory limitations. Also suggest physical deconditioning (seen with low-normal aerobic reserve). Mild VQ mismatch with exercise.  Ventilatory reserve exhausted with peak exercise demonstrated pulmonary limitation. No significant decrease in postexercise FEV1 compared to rest --> suggest no exercise induced bronchospasm   NM MYOVIEW ([facility] HX)  11/30/2011   EF 45% ,exercise 10 METS, infarct\scar w mild perinfarct ischemia -basal inferolat and mid inferolat region   NM MYOVIEW LTD  04/25/2016   Exercised for 9 minutes. Reached 90% max. Heart rate. 9.4 METS. Read as a low risk study with normal EF 55-65%. ->  on Review -  there does appear to be a small to moderate sized, medium severtiy reversible perfusion defect in the anterior wall - read by computer, but not noted by reader.   PERCUTANEOUS CORONARY STENT INTERVENTION (PCI-S)  07/07/2011   Procedure: PERCUTANEOUS CORONARY STENT INTERVENTION (PCI-S);  Surgeon: Leonie Man, MD;  Location: [facility] CATH LAB;  Service: Cardiovascular;;occluded RCA ,Integrity Resolute DES 2.75 x 18 mm stent - post-dilated 3.1 mm   PERCUTANEOUS CORONARY STENT INTERVENTION (PCI-S)  07/30/2013   Procedure: PERCUTANEOUS CORONARY STENT INTERVENTION (PCI-S);  Surgeon: Leonie Man, MD;  Location: [facility] CATH LAB;  Service: Cardiovascular;;Distal mid LAD: Xience Alpine DES 2.25 mm x 28 mm (overlapping proximal and distal edge of previous stent) - 2.7 mm; proximal LAD 2.5 mm x 12 mm Xience Alpine DES (2.8 mm);; RCA 60-70% stenosis planned staged procedure   PERCUTANEOUS CORONARY STENT INTERVENTION (PCI-S) N/A 11/15/2013   Procedure: PERCUTANEOUS CORONARY  STENT INTERVENTION (PCI-S);  Surgeon: Leonie Man, MD;  Location: [facility] CATH LAB;  Service: Cardiovascular;  Patent Cx & LAD Stents; PCI -dRCA Promus Premier DES 4.0 mm x 16 mm (4.25 mm) dRCA, 2.0 mm Cutting PTCA of RPL2 [place] & POBA of mRPDA   POLYPECTOMY  03/28/2015   Procedure: POLYPECTOMY;  Surgeon: Lucilla Lame, MD;  Location: [place];  Service: Endoscopy;;   POLYPECTOMY N/A 06/30/2020   Procedure: POLYPECTOMY;  Surgeon: Lucilla Lame, MD;  Location: [place];  Service: Endoscopy;  Laterality: N/A;   RIGHT HEART CATH  June 2015   Normal pressures with severely reduced cardiac output and index. (3.25/1.55)   TRANSTHORACIC ECHOCARDIOGRAM  07/31/2013; February 2017   a. LVEF 45-50. Inferior posterior hypokinesis with mild dilation. Apparent normal diastolic pressures;    UMBILICAL HERNIA REPAIR N/A 04/02/2015   Procedure: HERNIA REPAIR UMBILICAL ADULT;  Surgeon: Florene Glen, MD;  Location: [facility] ORS;  Service: General;  Laterality: N/A;    Prior to Admission medications   Medication Sig Start Date End Date Taking? Authorizing Provider  albuterol (VENTOLIN HFA) 108 (90 Base) MCG/ACT inhaler INHALE TWO PUFFS BY MOUTH EVERY 6 HOURS AS NEEDED FOR SHORTNESS OF BREATH 06/04/20  Yes Baity, Coralie Keens, NP  EFFIENT 10 MG TABS tablet Take 10 mg by mouth daily. 07/28/20  Yes [provider]  fenofibrate (TRICOR) 48 MG tablet TAKE ONE TABLET BY MOUTH DAILY 02/11/20  Yes Leonie Man, MD  Fluticasone-Salmeterol (ADVAIR) 100-50 MCG/DOSE AEPB Inhale 1 puff into the lungs in the morning and at bedtime. 06/04/20  Yes Baity, Coralie Keens, NP  ketoconazole (NIZORAL) 2 % shampoo Apply 1 application topically 2 (two) times a week. 07/02/20  Yes [provider]  metoprolol succinate (TOPROL-XL) 25 MG 24 hr tablet TAKE 1.5 TABLETS BY MOUTH EVERY EVENING 06/06/20  Yes Leonie Man, MD  Omega-3 Fatty Acids (FISH OIL) 1000 MG CPDR Take 3 g by mouth daily. 08/17/18  Yes Leonie Man, MD  omeprazole (PRILOSEC) 40 MG capsule Take 1 capsule (40 mg total) by mouth daily. 05/01/20  Yes Leonie Man, MD  rosuvastatin (CRESTOR) 40 MG tablet TAKE ONE TABLET BY MOUTH DAILY 03/27/20  Yes Leonie Man, MD  clobetasol cream (TEMOVATE) 0.05 % APPLY TOPICALLY TO AFFECTED AREA(S) DAILY AS NEEDED FOR ECZEMA Patient taking differently: Apply 1 application topically daily as needed (eczema). 01/10/18   Jearld Fenton, NP  fluticasone (FLONASE) 50 MCG/ACT nasal spray Place 2 sprays into both nostrils daily. Patient taking differently: Place 2  sprays into both nostrils daily as needed (congestion). 05/15/17 09/25/19  Johnn Hai, PA-C  Melatonin 1 MG CAPS Take 1 mg by mouth at bedtime as needed (sleep).    [provider]  Na Sulfate-K Sulfate-Mg Sulf (SUPREP BOWEL PREP KIT) 17.5-3.13-1.6 GM/177ML SOLN Take 1 kit by mouth as directed. 06/23/20   Lucilla Lame, MD  nitroGLYCERIN (NITROSTAT) 0.4 MG SL tablet Place 1 tablet (0.4 mg total) under the tongue every 5 (five) minutes as needed for chest pain. 01/18/18   Leonie Man, MD    Allergies as of 08/13/2020 - Review Complete 08/13/2020  Allergen Reaction Noted   Niacin and related Hives and Itching 12/31/2013    Family History  Problem Relation Age of Onset   Coronary artery disease Father    Heart disease Father    Stroke Father    Hypertension Father    Hyperlipidemia Mother    Diabetes Mother    Hyperlipidemia Maternal Grandmother    Diabetes Maternal Grandmother    Hyperlipidemia Maternal Grandfather    Diabetes Maternal Grandfather    Emphysema Maternal Grandfather    Heart disease Paternal Grandmother    Heart disease Paternal Grandfather    Cancer Maternal Aunt        breast    Social History   Socioeconomic History   Marital status: Married    Spouse name: Not on file   Number of children: Not on file   Years of education: Not on file   Highest education level: Not on file  Occupational  History   Not on file  Tobacco Use   Smoking status: Every Day    Packs/day: 0.50    Years: 40.00    Pack years: 20.00    Types: Cigarettes    Last attempt to quit: 01/19/2017    Years since quitting: 3.6   Smokeless tobacco: Never   Tobacco comments:    Quit with Bupropion $RemoveBefore'150mg'[id number]$  - > unfortunately, he restarted  Vaping Use   Vaping Use: Never used  Substance and Sexual Activity   Alcohol use: Yes    Comment: rare   Drug use: No   Sexual activity: Not on file  Other Topics Concern   Not on file  Social History Narrative   He is a married father of 14, grandfather of 70. He does not really get routine exercise.    He is down to 5 or 6 cigarettes a day. He is very seriously wanting to quit. This is a significant cutback for him, but he has pretty much been told he needs to stop by his wife, and so he fully intends to do so. He is willing to try the patches or whatever. He has a social alcoholic beverage every now and then.    Social Determinants of Health   Financial Resource Strain: Not on file  Food Insecurity: Not on file  Transportation Needs: Not on file  Physical Activity: Not on file  Stress: Not on file  Social Connections: Not on file  Intimate Partner Violence: Not on file    Review of Systems: See HPI, otherwise negative ROS  Physical Exam: BP 111/73   Pulse 77   Temp 97.9 F (36.6 C) (Temporal)   Resp 16   Ht $R'5\' 10"'be$  (1.778 m)   Wt 80.3 kg   SpO2 97%   BMI 25.40 kg/m  General:   Alert,  pleasant and cooperative in NAD Head:  Normocephalic and atraumatic. Neck:  Supple; no masses  or thyromegaly. Lungs:  Clear throughout to auscultation.    Heart:  Regular rate and rhythm. Abdomen:  Soft, nontender and nondistended. Normal bowel sounds, without guarding, and without rebound.   Neurologic:  Alert and  oriented x4;  grossly normal neurologically.  Impression/Plan: Troy Parrish. is here for an endoscopy to be performed for gastric intestinal  metaplasia  Risks, benefits, limitations, and alternatives regarding  endoscopy have been reviewed with the patient.  Questions have been answered.  All parties agreeable.   Lucilla Lame, MD  09/11/2020, 8:00 AM

## 2020-09-11 NOTE — Anesthesia Preprocedure Evaluation (Signed)
Anesthesia Evaluation  Patient identified by MRN, date of birth, ID band Patient awake    Reviewed: Allergy & Precautions, NPO status   Airway Mallampati: II  TM Distance: >3 FB     Dental   Pulmonary sleep apnea and Continuous Positive Airway Pressure Ventilation , COPD, Current Smoker and Patient abstained from smoking.,    Pulmonary exam normal        Cardiovascular hypertension, + CAD, + Past MI, + Cardiac Stents and +CHF ( EF~45% with hypokinesis of the inferior and inferolateral myocardium)   Rhythm:Regular Rate:Normal     Neuro/Psych  Headaches, Seizures -,     GI/Hepatic GERD  ,  Endo/Other  diabetes, Type 2  Renal/GU      Musculoskeletal   Abdominal   Peds  Hematology   Anesthesia Other Findings   Reproductive/Obstetrics                             Anesthesia Physical  Anesthesia Plan  ASA: 3  Anesthesia Plan: General   Post-op Pain Management:    Induction: Intravenous  PONV Risk Score and Plan: Propofol infusion, TIVA and Treatment may vary due to age or medical condition  Airway Management Planned: Natural Airway and Nasal Cannula  Additional Equipment:   Intra-op Plan:   Post-operative Plan:   Informed Consent: I have reviewed the patients History and Physical, chart, labs and discussed the procedure including the risks, benefits and alternatives for the proposed anesthesia with the patient or authorized representative who has indicated his/her understanding and acceptance.       Plan Discussed with: CRNA  Anesthesia Plan Comments:         Anesthesia Quick Evaluation

## 2020-09-15 ENCOUNTER — Telehealth: Payer: Self-pay

## 2020-09-15 LAB — SURGICAL PATHOLOGY

## 2020-09-15 NOTE — Telephone Encounter (Signed)
Pt notified of EGD results through [facility].

## 2020-09-15 NOTE — Telephone Encounter (Signed)
-----   Message from Lucilla Lame, MD sent at 09/15/2020  3:43 PM EDT ----- Let the patient know that the biopsies of the stomach showed the gastric intestinal metaplasia without any worrisome features.  The next upper endoscopy should be in 3 years.

## 2020-10-10 ENCOUNTER — Ambulatory Visit: Payer: PPO | Admitting: Gastroenterology

## 2020-10-10 ENCOUNTER — Encounter: Payer: Self-pay | Admitting: Gastroenterology

## 2020-10-10 ENCOUNTER — Telehealth: Payer: Self-pay

## 2020-10-10 ENCOUNTER — Other Ambulatory Visit: Payer: Self-pay

## 2020-10-10 VITALS — BP 118/76 | HR 84 | Temp 97.1°F | Ht 70.0 in | Wt 175.2 lb

## 2020-10-10 DIAGNOSIS — K640 First degree hemorrhoids: Secondary | ICD-10-CM | POA: Diagnosis not present

## 2020-10-10 NOTE — Telephone Encounter (Signed)
Blood thinner request was faxed to Dr. Shanon Brow Harding's office for Effient.

## 2020-10-10 NOTE — Progress Notes (Signed)
Troy Darby, MD [facility]  [place], [place] [address]  Main: [phone]  Fax: [phone]    Gastroenterology Consultation  Referring Provider:     Jearld Fenton, NP Primary Care Physician:  Jearld Fenton, NP Primary Gastroenterologist:  Dr. Lucilla Lame Reason for Consultation:     Symptomatic hemorrhoids        HPI:   Troy Krider. is a 60 y.o. male referred by Dr. Jearld Fenton, NP  for consultation & management of symptomatic hemorrhoids.  Patient has history of coronary disease s/p PCI, on Effient, history of COPD is referred by Dr. Allen Norris to discuss about management of symptomatic hemorrhoids.  Patient reports that he has been intermittently experiencing hemorrhoidal symptoms such as swelling, discomfort, pain every 1 to 2 months.  The most recent flareup was in May 2022 prior to colonoscopy which was quite severe associated with intense pain that he could not even wipe.  He noticed rectal bleeding at that time.  He probably had a thrombosed external hemorrhoid that has spontaneously healed. Other than this recent flareup, his hemorrhoidal symptoms generally last for 24 to 48 hours and spontaneously resolved.  He does not have any other GI concerns today  NSAIDs: None  Antiplts/Anticoagulants/Anti thrombotics: Effient  GI Procedures: Reviewed under procedures tab  Past Medical History:  Diagnosis Date   Bronchitis    Gets bronchitis almost every year   CAD S/P percutaneous coronary angioplasty 06/2011; 6/ & 11/2013   S/P PCI to all 3 major vessels;a)  Ant STEMI 2001- BMS-LAD x2 -->(redo PCI 6/'15 -- pLAD Xience DES 2.5 x 12- 2.75 mm, mLAD 2.25 x 12 - 2.7 mm), b) '06 UA --> Cx- OM DES; c) 2013 Inf MI BMS mRCA --> d) 10/'15 PCI dRCA Promus P DES 4.0 x 16 (4.25 mm); PTCA of RPL2 (2.0 mm) &RPDA (2.25 mm) 11/2013; 4/18 PCI OM1 Synergy DES  2.5 x 16   COPD (chronic obstructive pulmonary disease) (HCC)    Elevated WBC count    Emphysema of lung  ([place])    Essential hypertension 07/07/2011   Former heavy tobacco smoker     quit in May 2015 after multiple attempts at trying to quit before    GERD (gastroesophageal reflux disease)    Glucose intolerance (pre-diabetes) June 2015    hemoglobin A1c 6.6   Headache    "Imdur related; stopped taking it; headaches went away" (11/15/2013)   Heart murmur    History of colon polyps    History of stomach ulcers    Hyperlipidemia with target LDL less than 70 7/57/9728   Metabolic syndrome    Pre-diabetes, hypertension and truncal obesity as well as dyslipidemia   OSA on CPAP    Pneumonia "several times"   Recurrent upper respiratory infection (URI)    Seizures ([place]) "several"   "last one was ~ 2011" (11/15/2013)   Shortness of breath dyspnea    ST elevation myocardial infarction (STEMI) of anterior wall ([place]) 2001   History of -- 2 stents in early and distal mid LAD; prior cardiologist was Dr. Remi Haggard in [place] elevation myocardial infarction (STEMI) of inferior wall ([place]) 07/07/2011   Occluded RCA 2.75X18 INTEGRITY; Echo 07/2013: EF 45-50%, Inferior & Posterolateral HK.    Umbilical hernia    Unstable angina ([place]) 2006; 07/2013   Cx-OM - PCI 2.5 mm 13 mm Cypher DES [name], [place] [employer]) ; 07/2013: Severe ISR of both prox &  Distal LAD stentS --> 2 DES stents (1 at prox edge of the proximal stent, 2nd covers the entire distal stent as well as proximal and distal edge stenose)    Upper respiratory infection     Past Surgical History:  Procedure Laterality Date   BIOPSY N/A 06/30/2020   Procedure: BIOPSY;  Surgeon: Lucilla Lame, MD;  Location: [facility];  Service: Endoscopy;  Laterality: N/A;   Cardiac MRI [facility]  09/2014   EF 51%. Mod HK of basal-mid Inf wall, mild HK of basal inferoseptum & basal-mid inferolateral wall with hyper enhancement (c/w subendocard RCA MI with significant viability), focal hyper enhancement of apical wall c/w dLAD subendocard MI & complete LAD  viability. . No aortic stenosis. Normal RV function. No Ischemia on Adenosine Stress.   COLONOSCOPY WITH PROPOFOL N/A 03/28/2015   Procedure: COLONOSCOPY WITH PROPOFOL;  Surgeon: Lucilla Lame, MD;  Location: [facility];  Service: Endoscopy;  Laterality: N/A;   COLONOSCOPY WITH PROPOFOL N/A 06/30/2020   Procedure: COLONOSCOPY WITH PROPOFOL;  Surgeon: Lucilla Lame, MD;  Location: [facility];  Service: Endoscopy;  Laterality: N/A;   CORONARY ANGIOPLASTY WITH STENT PLACEMENT  2001   ANTERIOR mi with a  PCI to LAD; [place], [place] - Dr. Dorris Fetch   CORONARY ANGIOPLASTY WITH STENT PLACEMENT  2006   [place] [facility] by Dr Dorris Fetch -lesion lft circ/OM1 with 2.5 x 26mm Cypher DES   CORONARY STENT INTERVENTION N/A 05/19/2016   Procedure: Coronary Stent Intervention;  Surgeon: Leonie Man, MD;  Location: [facility];  Service: Cardiovascular: PCI pOM1 - Synergy DES 2.5 x 16 (overlaps old stent)   ESOPHAGOGASTRODUODENOSCOPY (EGD) WITH PROPOFOL N/A 03/28/2015   Procedure: ESOPHAGOGASTRODUODENOSCOPY (EGD) WITH PROPOFOL;  Surgeon: Lucilla Lame, MD;  Location: [facility];  Service: Endoscopy;  Laterality: N/A;   ESOPHAGOGASTRODUODENOSCOPY (EGD) WITH PROPOFOL N/A 06/30/2020   Procedure: ESOPHAGOGASTRODUODENOSCOPY (EGD) WITH PROPOFOL;  Surgeon: Lucilla Lame, MD;  Location: [facility];  Service: Endoscopy;  Laterality: N/A;   ESOPHAGOGASTRODUODENOSCOPY (EGD) WITH PROPOFOL N/A 09/11/2020   Procedure: ESOPHAGOGASTRODUODENOSCOPY (EGD) WITH PROPOFOL;  Surgeon: Lucilla Lame, MD;  Location: [facility];  Service: Endoscopy;  Laterality: N/A;   INSERTION OF MESH N/A 04/02/2015   Procedure: INSERTION OF MESH;  Surgeon: Florene Glen, MD;  Location: [facility];  Service: General;  Laterality: N/A;   LEFT AND RIGHT HEART CATHETERIZATION WITH CORONARY ANGIOGRAM N/A 07/30/2013   Procedure: LEFT AND RIGHT HEART CATHETERIZATION WITH CORONARY ANGIOGRAM;  Surgeon: Leonie Man, MD;  Location: [facility]  CATH LAB;  Service: Cardiovascular;  2 separate P & mLAD lesions -> PCI, MOd-Severe dRCA disesase with diffuse RPL/PDA disease (FFR)   LEFT HEART CATH  08/03/2013   Procedure: LEFT HEART CATH;  Surgeon: Leonie Man, MD;  Location: [facility] CATH LAB;  Service: Cardiovascular;;FFR of RCA - non-flow-limiting   LEFT HEART CATH AND CORONARY ANGIOGRAPHY N/A 05/19/2016   Procedure: Left Heart Cath and Coronary Angiography;  Surgeon: Leonie Man, MD;  Location: [facility];  Service: Cardiovascular: CULPRIT - 85% pOM1 pre-stent. Stents in both proximal and distal RCA widely patent. PTCA sites in RPL & PDA widely patent with stable 70% small RPL 2. Extensive stenting in the LAD widely patent stents with mild to mod Dz btw prox & mid-distal stents.    LEFT HEART CATH AND CORONARY ANGIOGRAPHY N/A 02/13/2018   Procedure: LEFT HEART CATH AND CORONARY ANGIOGRAPHY;  Surgeon: Leonie Man, MD;  Location: [facility];  Service: Cardiovascular: Similar findings to last cath post PCI.  Stents are patent.  Roughly 50% proximal LAD lesion noted, may be progression from prior cath but otherwise stable disease.   LEFT HEART CATHETERIZATION WITH CORONARY ANGIOGRAM N/A 07/07/2011   Procedure: LEFT HEART CATHETERIZATION WITH CORONARY ANGIOGRAM;  Surgeon: Leonie Man, MD;  Location: [facility] CATH LAB;  Service: Cardiovascular;  inferolat post LV infarct ST-elevation MI   MET/CPET  11/09/2011   submax. effort 1.05 RER peak V02 was 51% ,chronotropic incomp.hrt rate lows 80s to 100   MET/CPET  September 2016   [facility]: Mild functional impairment due primarily to mild pulmonary and circulatory limitations. Also suggest physical deconditioning (seen with low-normal aerobic reserve). Mild VQ mismatch with exercise.  Ventilatory reserve exhausted with peak exercise demonstrated pulmonary limitation. No significant decrease in postexercise FEV1 compared to rest --> suggest no exercise induced bronchospasm   NM MYOVIEW ([facility] HX)   11/30/2011   EF 45% ,exercise 10 METS, infarct\scar w mild perinfarct ischemia -basal inferolat and mid inferolat region   NM MYOVIEW LTD  04/25/2016   Exercised for 9 minutes. Reached 90% max. Heart rate. 9.4 METS. Read as a low risk study with normal EF 55-65%. -> on Review -  there does appear to be a small to moderate sized, medium severtiy reversible perfusion defect in the anterior wall - read by computer, but not noted by reader.   PERCUTANEOUS CORONARY STENT INTERVENTION (PCI-S)  07/07/2011   Procedure: PERCUTANEOUS CORONARY STENT INTERVENTION (PCI-S);  Surgeon: Leonie Man, MD;  Location: [facility] CATH LAB;  Service: Cardiovascular;;occluded RCA ,Integrity Resolute DES 2.75 x 18 mm stent - post-dilated 3.1 mm   PERCUTANEOUS CORONARY STENT INTERVENTION (PCI-S)  07/30/2013   Procedure: PERCUTANEOUS CORONARY STENT INTERVENTION (PCI-S);  Surgeon: Leonie Man, MD;  Location: [facility] CATH LAB;  Service: Cardiovascular;;Distal mid LAD: Xience Alpine DES 2.25 mm x 28 mm (overlapping proximal and distal edge of previous stent) - 2.7 mm; proximal LAD 2.5 mm x 12 mm Xience Alpine DES (2.8 mm);; RCA 60-70% stenosis planned staged procedure   PERCUTANEOUS CORONARY STENT INTERVENTION (PCI-S) N/A 11/15/2013   Procedure: PERCUTANEOUS CORONARY STENT INTERVENTION (PCI-S);  Surgeon: Leonie Man, MD;  Location: [facility] CATH LAB;  Service: Cardiovascular;  Patent Cx & LAD Stents; PCI -dRCA Promus Premier DES 4.0 mm x 16 mm (4.25 mm) dRCA, 2.0 mm Cutting PTCA of RPL2 [place] & POBA of mRPDA   POLYPECTOMY  03/28/2015   Procedure: POLYPECTOMY;  Surgeon: Lucilla Lame, MD;  Location: [facility];  Service: Endoscopy;;   POLYPECTOMY N/A 06/30/2020   Procedure: POLYPECTOMY;  Surgeon: Lucilla Lame, MD;  Location: [employer] [facility];  Service: Endoscopy;  Laterality: N/A;   RIGHT HEART CATH  June 2015   Normal pressures with severely reduced cardiac output and index. (3.25/1.55)   TRANSTHORACIC ECHOCARDIOGRAM  07/31/2013;  February 2017   a. LVEF 45-50. Inferior posterior hypokinesis with mild dilation. Apparent normal diastolic pressures;    UMBILICAL HERNIA REPAIR N/A 04/02/2015   Procedure: HERNIA REPAIR UMBILICAL ADULT;  Surgeon: Florene Glen, MD;  Location: [facility];  Service: General;  Laterality: N/A;    Prior to Admission medications   Medication Sig Start Date End Date Taking? Authorizing Provider  albuterol (VENTOLIN HFA) 108 (90 Base) MCG/ACT inhaler INHALE TWO PUFFS BY MOUTH EVERY 6 HOURS AS NEEDED FOR SHORTNESS OF BREATH 06/04/20  Yes Baity, Coralie Keens, NP  clobetasol cream (TEMOVATE) 0.05 % APPLY TOPICALLY TO AFFECTED AREA(S) DAILY AS NEEDED  FOR ECZEMA Patient taking differently: Apply 1 application topically daily as needed (eczema). 01/10/18  Yes Baity, Coralie Keens, NP  EFFIENT 10 MG TABS tablet Take 10 mg by mouth daily. 07/28/20  Yes [provider]  fenofibrate (TRICOR) 48 MG tablet TAKE ONE TABLET BY MOUTH DAILY 02/11/20  Yes Leonie Man, MD  Fluticasone-Salmeterol (ADVAIR) 100-50 MCG/DOSE AEPB Inhale 1 puff into the lungs in the morning and at bedtime. 06/04/20  Yes Baity, Coralie Keens, NP  ketoconazole (NIZORAL) 2 % shampoo Apply 1 application topically 2 (two) times a week. 07/02/20  Yes [provider]  Melatonin 1 MG CAPS Take 1 mg by mouth at bedtime as needed (sleep).   Yes [provider]  metoprolol succinate (TOPROL-XL) 25 MG 24 hr tablet TAKE 1.5 TABLETS BY MOUTH EVERY EVENING 06/06/20  Yes Leonie Man, MD  Omega-3 Fatty Acids (FISH OIL) 1000 MG CPDR Take 3 g by mouth daily. 08/17/18  Yes Leonie Man, MD  omeprazole (PRILOSEC) 40 MG capsule Take 1 capsule (40 mg total) by mouth daily. 05/01/20  Yes Leonie Man, MD  rosuvastatin (CRESTOR) 40 MG tablet TAKE ONE TABLET BY MOUTH DAILY 03/27/20  Yes Leonie Man, MD  fluticasone [facility]) 50 MCG/ACT nasal spray Place 2 sprays into both nostrils daily. Patient not taking: Reported on 10/10/2020 05/15/17 09/25/19   Johnn Hai, PA-C  nitroGLYCERIN (NITROSTAT) 0.4 MG SL tablet Place 1 tablet (0.4 mg total) under the tongue every 5 (five) minutes as needed for chest pain. Patient not taking: Reported on 10/10/2020 01/18/18   Leonie Man, MD    Family History  Problem Relation Age of Onset   Coronary artery disease Father    Heart disease Father    Stroke Father    Hypertension Father    Hyperlipidemia Mother    Diabetes Mother    Hyperlipidemia Maternal Grandmother    Diabetes Maternal Grandmother    Hyperlipidemia Maternal Grandfather    Diabetes Maternal Grandfather    Emphysema Maternal Grandfather    Heart disease Paternal Grandmother    Heart disease Paternal Grandfather    Cancer Maternal Aunt        breast     Social History   Tobacco Use   Smoking status: Every Day    Packs/day: 0.50    Years: 40.00    Pack years: 20.00    Types: Cigarettes    Last attempt to quit: 01/19/2017    Years since quitting: 3.7   Smokeless tobacco: Never   Tobacco comments:    Quit with Bupropion $RemoveBefore'150mg'[id number]$  - > unfortunately, he restarted  Vaping Use   Vaping Use: Never used  Substance Use Topics   Alcohol use: Yes    Comment: rare   Drug use: No    Allergies as of 10/10/2020 - Review Complete 10/10/2020  Allergen Reaction Noted   Niacin and related Hives and Itching 12/31/2013    Review of Systems:    All systems reviewed and negative except where noted in HPI.   Physical Exam:  BP 118/76   Pulse 84   Temp (!) 97.1 F (36.2 C) (Oral)   Ht $R'5\' 10"'QK$  (1.778 m)   Wt 175 lb 3.2 oz (79.5 kg)   BMI 25.14 kg/m  No LMP for male patient.  General:   Alert,  Well-developed, well-nourished, pleasant and cooperative in NAD Head:  Normocephalic and atraumatic. Eyes:  Sclera clear, no icterus.   Conjunctiva pink. Ears:  Normal auditory  acuity. Nose:  No deformity, discharge, or lesions. Mouth:  No deformity or lesions,oropharynx pink & moist. Neck:  Supple; no masses or  thyromegaly. Lungs:  Respirations even and unlabored.  Clear throughout to auscultation.   No wheezes, crackles, or rhonchi. No acute distress. Heart:  Regular rate and rhythm; no murmurs, clicks, rubs, or gallops. Abdomen:  Normal bowel sounds. Soft, non-tender and non-distended without masses, hepatosplenomegaly or hernias noted.  No guarding or rebound tenderness.   Rectal: Not performed Msk:  Symmetrical without gross deformities. Good, equal movement & strength bilaterally. Pulses:  Normal pulses noted. Extremities:  No clubbing or edema.  No cyanosis. Neurologic:  Alert and oriented x3;  grossly normal neurologically. Skin:  Intact without significant lesions or rashes. No jaundice. Psych:  Alert and cooperative. Normal mood and affect.  Imaging Studies: Reviewed  Assessment and Plan:   Troy Morici. is a 60 y.o. male with coronary disease, fatty liver, colonic diverticulosis is seen in consultation for symptomatic hemorrhoids.  Patient is currently on Effient.  I did discuss in detail regarding the hemorrhoid banding procedure, risks and benefits.  Patient is willing to undergo hemorrhoid ligation.  We will obtain clearance for interruption of anticoagulation for the hemorrhoid banding from his cardiologist   Follow up in 3 weeks   Troy Darby, MD

## 2020-10-24 ENCOUNTER — Telehealth: Payer: Self-pay | Admitting: Cardiology

## 2020-10-24 NOTE — Telephone Encounter (Signed)
   [facility] Pre-operative Risk Assessment    Request for surgical clearance:  What type of surgery is being performed?  Hemorrhoid Banding   When is this surgery scheduled?  11/21/20   What type of clearance is required (medical clearance vs. Pharmacy clearance to hold med vs. Both)?  Both   Are there any medications that need to be held prior to surgery and how long? EFFIENT, their office is requesting our recommendation   Practice name and name of physician performing surgery?  [facility]  Dr. Marius Ditch    What is your office phone number? [phone]    7.   What is your office fax number? [phone]  8.   Anesthesia type (None, local, MAC, general) ?  None   Troy Parrish 10/24/2020, 8:21 AM

## 2020-10-24 NOTE — Telephone Encounter (Signed)
Called Dr. Allison Quarry office and they still have not filled out our form. I asked for them to do so soon.

## 2020-10-27 NOTE — Telephone Encounter (Signed)
Left message to call back at 10:17 AM on 10/27/2020.  Patient is call back.

## 2020-10-30 NOTE — Telephone Encounter (Signed)
Blood thinner request, refaxed.

## 2020-10-30 NOTE — Telephone Encounter (Signed)
Voicemail to call back

## 2020-10-31 ENCOUNTER — Ambulatory Visit: Payer: PPO | Admitting: Gastroenterology

## 2020-11-03 NOTE — Telephone Encounter (Signed)
Patient has an appointment with Dr. Ellyn Hack on 11/04/2020. Hopefully we get blood thinner request so he could get a hemorrhoid banding.

## 2020-11-03 NOTE — Telephone Encounter (Addendum)
   Patient Name: Troy Parrish.  DOB: 1960-10-06 MRN: [id number]  Primary Cardiologist: Glenetta Hew, MD  Chart reviewed as part of pre-operative protocol coverage.   Preop team has been unsuccessful at reaching patient. However, he does have a visit with Dr. Ellyn Hack schedule tomorrow 9/27 at which time this clearance can be addressed. I added preop reminder to appt notes. Per office protocol, the provider should assess clearance at time of office visit and should forward their finalized clearance decision to requesting party below since separate APP input not needed at this time. I will route as FYI to requesting surgeon. Will route to Dr. Ellyn Hack and his nurse so they're aware as well. Will remove from pre-op APP box.  Charlie Pitter, PA-C 11/03/2020, 8:57 AM

## 2020-11-04 ENCOUNTER — Encounter: Payer: Self-pay | Admitting: Cardiology

## 2020-11-04 ENCOUNTER — Ambulatory Visit: Payer: PPO | Admitting: Cardiology

## 2020-11-04 ENCOUNTER — Other Ambulatory Visit: Payer: Self-pay

## 2020-11-04 VITALS — BP 140/90 | HR 91 | Ht 71.0 in | Wt 176.0 lb

## 2020-11-04 DIAGNOSIS — K2100 Gastro-esophageal reflux disease with esophagitis, without bleeding: Secondary | ICD-10-CM

## 2020-11-04 DIAGNOSIS — I10 Essential (primary) hypertension: Secondary | ICD-10-CM

## 2020-11-04 DIAGNOSIS — E785 Hyperlipidemia, unspecified: Secondary | ICD-10-CM

## 2020-11-04 DIAGNOSIS — I5032 Chronic diastolic (congestive) heart failure: Secondary | ICD-10-CM

## 2020-11-04 DIAGNOSIS — G4733 Obstructive sleep apnea (adult) (pediatric): Secondary | ICD-10-CM

## 2020-11-04 DIAGNOSIS — I25119 Atherosclerotic heart disease of native coronary artery with unspecified angina pectoris: Secondary | ICD-10-CM | POA: Diagnosis not present

## 2020-11-04 DIAGNOSIS — Z955 Presence of coronary angioplasty implant and graft: Secondary | ICD-10-CM | POA: Diagnosis not present

## 2020-11-04 DIAGNOSIS — I2109 ST elevation (STEMI) myocardial infarction involving other coronary artery of anterior wall: Secondary | ICD-10-CM

## 2020-11-04 DIAGNOSIS — E781 Pure hyperglyceridemia: Secondary | ICD-10-CM

## 2020-11-04 DIAGNOSIS — I255 Ischemic cardiomyopathy: Secondary | ICD-10-CM | POA: Diagnosis not present

## 2020-11-04 DIAGNOSIS — Z01810 Encounter for preprocedural cardiovascular examination: Secondary | ICD-10-CM | POA: Insufficient documentation

## 2020-11-04 DIAGNOSIS — I2111 ST elevation (STEMI) myocardial infarction involving right coronary artery: Secondary | ICD-10-CM

## 2020-11-04 NOTE — Assessment & Plan Note (Signed)
He was having issues using the CPAP, hopefully with weight loss, he would not need it as much.  He may follow-up with his sleep medicine physician to reassess.

## 2020-11-04 NOTE — Assessment & Plan Note (Signed)
On PPI.  If he has bleeding issues.  We will simply hold Effient for 5 to 7 days to allow for healing.

## 2020-11-04 NOTE — Assessment & Plan Note (Signed)
PND, orthopnea or edema.  On sodium low-dose Toprol.  No diuretic requirement.  He may benefit from low-dose diuretic in the future.

## 2020-11-04 NOTE — Assessment & Plan Note (Addendum)
Mildly reduced EF, but no real active heart failure symptoms-NYHA Class I.  We converted from diltiazem to Toprol at one of his last visits.  Tolerating it well.  He is on Toprol now only taking 25 mg at night.  Because of hypotension past, he is not on ARB or ACE inhibitor. ->  Would strongly consider low-dose ARB if pressures are still elevated  He is euvolemic & not on diuretic.  If there is indeed a concern for possible diabetes involvement, would recommend SGLT2.  Inhibitor No diuretic requirement.

## 2020-11-04 NOTE — Progress Notes (Addendum)
Primary Care Provider: Jearld Fenton, NP Cardiologist: Glenetta Hew, MD Electrophysiologist: None Gastroenterologist: Dr. Lucilla Lame  Clinic Note: Chief Complaint  Patient presents with   Follow-up    35-month follow-up.  Doing well.  Feels as well as he has for almost 5 years.    ===================================  ASSESSMENT/PLAN   Problem List Items Addressed This Visit       Cardiology Problems   STEMI, acute inf. wall with 100% RCA stenosis, DES 07/07/11 (prior Anterior STEMI in 2001) - Primary (Chronic)   Relevant Orders   EKG 12-Lead (Completed)   Cardiomyopathy, ischemic; EF~45% with hypokinesis of the inferior and inferolateral myocardium; CO 3.25 (Chronic)    Mildly reduced EF, but no real active heart failure symptoms-NYHA Class I.  We converted from diltiazem to Toprol at one of his last visits.  Tolerating it well.  He is on Toprol now only taking 25 mg at night.  Because of hypotension past, he is not on ARB or ACE inhibitor. ->  Would strongly consider low-dose ARB if pressures are still elevated  He is euvolemic & not on diuretic.  If there is indeed a concern for possible diabetes involvement, would recommend SGLT2.  Inhibitor No diuretic requirement.      Relevant Orders   EKG 12-Lead (Completed)   Lipid panel   ST elevation myocardial infarction (STEMI) of anterior wall -- PCI to LAD (Chronic)   Relevant Orders   EKG 12-Lead (Completed)   Lipid panel   Coronary artery disease involving native coronary artery of native heart with angina pectoris (HCC) (Chronic)    Chronic stable angina with multivessel CAD-in the past has had multiple MIs noted below.  Essentially every major vascular group has had an MI.Marland Kitchen  He has stents in the RCA, LAD and major OM1 branch.  Thankfully, no further anginal symptoms.  He is very active but not doing routine exercise.  Plan: Continue low-dose Toprol for now, low threshold to consider adding ARB not on antiplatelet  agent as he is on Effient. ->  Continue maintenance dose Effient which can be hold 9 days preop for surgery or surgery.  Continue current dose of rosuvastatin and fenofibrate for most recent labs are outstanding.        Relevant Orders   EKG 12-Lead (Completed)   Hemoglobin A1c   Lipid panel   Chronic diastolic congestive heart failure, NYHA class 1 (HCC) (Chronic)    PND, orthopnea or edema.  On sodium low-dose Toprol.  No diuretic requirement.  He may benefit from low-dose diuretic in the future.      Relevant Orders   EKG 12-Lead (Completed)   Essential hypertension (Chronic)   Relevant Orders   EKG 12-Lead (Completed)   Hyperlipidemia with target LDL less than 70; by his report he is back on Crestor (Chronic)   Relevant Orders   EKG 12-Lead (Completed)   Hemoglobin A1c   Lipid panel   Hypertriglyceridemia (Chronic)   Relevant Orders   EKG 12-Lead (Completed)   Hemoglobin A1c   Lipid panel     Other   Presence of drug coated stent in right coronary artery (Chronic)    He has extensive PCI with multiple vessels stented.  He is on lifelong Thienopyridine therapy with prasugrel/Effient.  Okay to hold prasugrel 9 days preop for surgeries or procedures.-Can restart 2 days postop.  He has an upcoming GI procedure/hemorrhoid procedure.  Okay to hold      Relevant Orders   EKG 12-Lead (  Completed)   Preop cardiovascular exam   OSA (obstructive sleep apnea) (Chronic)    He was having issues using the CPAP, hopefully with weight loss, he would not need it as much.  He may follow-up with his sleep medicine physician to reassess.      Reflux esophagitis (Chronic)    On PPI.  If he has bleeding issues.  We will simply hold Effient for 5 to 7 days to allow for healing.       ===================================  HPI:    Troy Parrish. is a 60 y.o. male with a PMH significant Multivessel CAD with multiple MIs who presents today for 46-month follow-up, and preop clearance  for Hemorrhoid Sgx.   Troy Parrish. was last seen on May 01, 2020 as a 80-month plan follow-up.  He was very happy with how he is doing.  Feeling remarkably well.  He did adjust his diet and working on losing weight again.  He was almost successful quitting smoking.  No real complaints of any cardiac symptoms angina heart failure or palpitations.  No syncope or near-syncope.  He is excited spending time with family.  His wife has been on to get lots of vacation days.  He had gained weight over Christmas but was losing it back. -> For GERD symptoms I increase his omeprazole to 40 mg daily.  Otherwise no changes.  Recent Hospitalizations: Outpatient procedure as noted below. EGD/colonoscopy 06/30/2020: Small hiatal hernia.  A few localized small erosions with no stigmata of recent bleeding noted in the gastric antrum.  Biopsies taken.  Normal duodenum -> antral biopsy negative for dysplasia or malignancy negative for Helicobacter pylori.  Single polyp noted in both the ascending and descending colons with 2 polyps in the transverse colon.  All resected with cold snare. Chronic active gastritis with intestinal metaplasia.  4 colon polyps resected.  Negative for high-grade dysplasia and malignancy.  Both ascending and descending polyp-tubular adenoma. EGD 09/11/2020: Small hiatal hernia with large amount of food residual in the stomach.  Normal duodenum.  Biopsies taken the greater curvature of the stomach and the stricture of the stomach, incisure and gastric antrum.   Reviewed  CV studies:    The following studies were reviewed today: (if available, images/films reviewed: From Epic Chart or Care Everywhere) No new studies  Interval History:   Troy Parrish. presents here today overall saying he feels as well as he has last 5 to 10 years.  He has more energy than his ever had.  His weight is down, he is not really noticing any symptoms of exertional dyspnea unless he really overdoes it.  He is  definitely at peace with where he is in his life.  He is enjoying time he is spending with his wife and family.  His wife has been able to get lots of intermittent vacations and they have been traveling.  They just got back from [place]-driving up with one of their sons and meeting up with a cousin.  They enjoyed the drive especially enjoyed the final destination.  He is sleeping well, eating healthy and exercising routinely.  Has not had any chest pain or pressure with rest or exertion.  No irregular heartbeats or palpitations.  Essentially asymptomatic no cardiac standpoint.  He is a little taken aback get his blood pressure being 140/90.  He says that he was rushed to get in here, running late because of traffic and was drinking his coffee and route.  He says he then power walked about a block and a half from where he parked to get to the elevator and then took the stairs up.  He says at home his pressures are usually 1 20-1 30 over 70s.  He said he had a couple days of of what seemed like bleeding hemorrhoids.  The hemorrhoids are cleared up, but now that they are stable, the plan is for him to have lifting and ligation done.  CV Review of Symptoms (Summary) Cardiovascular ROS: positive for -  he notes Mild exertional dyspnea and exercise intolerance.  He says that he really only gets short of breath or tired with increased negative for - chest pain, edema, irregular heartbeat, orthopnea, palpitations, paroxysmal nocturnal dyspnea, rapid heart rate, shortness of breath, or lightheadedness, dizziness or wooziness, syncope/near syncope or TIA/amaurosis fugax, claudication.  REVIEWED OF SYSTEMS   Review of Systems  Constitutional:  Positive for weight loss (He had gotten away at 298 pounds (time.  He has been eating better and exercising routinely.). Negative for malaise/fatigue (Energy level is doing much better.  He is fully active.  Maybe only gets short of breath or tired if he really overexerts.   He pays the price next day.).  HENT:  Negative for congestion and nosebleeds.   Respiratory:  Negative for cough (Doing much better having quit smoking.) and shortness of breath.   Cardiovascular:  Negative for leg swelling.  Gastrointestinal:  Negative for blood in stool and melena.  Genitourinary:  Negative for hematuria.  Musculoskeletal:  Positive for joint pain (Mild aches and pains, but nothing that holds him back.).  Neurological:  Positive for dizziness (Only rarely if he stands up too quickly.).  Psychiatric/Behavioral:  Negative for depression and memory loss. The patient is not nervous/anxious and does not have insomnia.        In very good mood.  He is extremely happy both his physical and social/psychological health.  This is the main driving force that keeps him doing well from a physical standpoint.  He is happy in his marriage, happy with his sons and how are they are doing.  Very proud of their dedication in school.  Enjoys seeing his grandkids.  Enjoys going on vacations   I have reviewed and (if needed) personally updated the patient's problem list, medications, allergies, past medical and surgical history, social and family history.   PAST MEDICAL HISTORY   Past Medical History:  Diagnosis Date   Bronchitis    Gets bronchitis almost every year   CAD S/P percutaneous coronary angioplasty 06/2011; 6/ & 11/2013   S/P PCI to all 3 major vessels;a)  Ant STEMI 2001- BMS-LAD x2 -->(redo PCI 6/'15 -- pLAD Xience DES 2.5 x 12- 2.75 mm, mLAD 2.25 x 12 - 2.7 mm), b) '06 UA --> Cx- OM DES; c) 2013 Inf MI BMS mRCA --> d) 10/'15 PCI dRCA Promus P DES 4.0 x 16 (4.25 mm); PTCA of RPL2 (2.0 mm) &RPDA (2.25 mm) 11/2013; 4/18 PCI OM1 Synergy DES  2.5 x 16   COPD (chronic obstructive pulmonary disease) (HCC)    Elevated WBC count    Emphysema of lung ([place])    Essential hypertension 07/07/2011   Former heavy tobacco smoker     quit in May 2015 after multiple attempts at trying to quit before     GERD (gastroesophageal reflux disease)    Glucose intolerance (pre-diabetes) June 2015    hemoglobin A1c 6.6   Headache    "Imdur  related; stopped taking it; headaches went away" (11/15/2013)   Heart murmur    History of colon polyps    History of stomach ulcers    Hyperlipidemia with target LDL less than 70 8/33/8250   Metabolic syndrome    Pre-diabetes, hypertension and truncal obesity as well as dyslipidemia   OSA on CPAP    Pneumonia "several times"   Recurrent upper respiratory infection (URI)    Seizures ([place]) "several"   "last one was ~ 2011" (11/15/2013)   Shortness of breath dyspnea    ST elevation myocardial infarction (STEMI) of anterior wall ([place]) 2001   History of -- 2 stents in early and distal mid LAD; prior cardiologist was Dr. Remi Haggard in [place] elevation myocardial infarction (STEMI) of inferior wall ([place]) 07/07/2011   Occluded RCA 5.39J67 INTEGRITY; Echo 07/2013: EF 45-50%, Inferior & Posterolateral HK.    Umbilical hernia    Unstable angina ([place]) 2006; 07/2013   Cx-OM - PCI 2.5 mm 13 mm Cypher DES [name], [place]) ; 07/2013: Severe ISR of both prox & Distal LAD stentS --> 2 DES stents (1 at prox edge of the proximal stent, 2nd covers the entire distal stent as well as proximal and distal edge stenose)    Upper respiratory infection     PAST SURGICAL HISTORY   Past Surgical History:  Procedure Laterality Date   BIOPSY N/A 06/30/2020   Procedure: BIOPSY;  Surgeon: Lucilla Lame, MD;  Location: [place];  Service: Endoscopy;  Laterality: N/A;   Cardiac MRI [facility]  09/2014   EF 51%. Mod HK of basal-mid Inf wall, mild HK of basal inferoseptum & basal-mid inferolateral wall with hyper enhancement (c/w subendocard RCA MI with significant viability), focal hyper enhancement of apical wall c/w dLAD subendocard MI & complete LAD viability. . No aortic stenosis. Normal RV function. No Ischemia on Adenosine Stress.   COLONOSCOPY WITH PROPOFOL N/A  03/28/2015   Procedure: COLONOSCOPY WITH PROPOFOL;  Surgeon: Lucilla Lame, MD;  Location: [place];  Service: Endoscopy;  Laterality: N/A;   COLONOSCOPY WITH PROPOFOL N/A 06/30/2020   Procedure: COLONOSCOPY WITH PROPOFOL;  Surgeon: Lucilla Lame, MD;  Location: [place];  Service: Endoscopy;  Laterality: N/A;   CORONARY ANGIOPLASTY WITH STENT PLACEMENT  2001   ANTERIOR mi with a  PCI to LAD; [place], [place] - Dr. Dorris Fetch   CORONARY ANGIOPLASTY WITH STENT PLACEMENT  2006   [place] by Dr Dorris Fetch -lesion lft circ/OM1 with 2.5 x 34mm Cypher DES   CORONARY STENT INTERVENTION N/A 05/19/2016   Procedure: Coronary Stent Intervention;  Surgeon: Leonie Man, MD;  Location: [place] CV [facility];  Service: Cardiovascular: PCI pOM1 - Synergy DES 2.5 x 16 (overlaps old stent)   ESOPHAGOGASTRODUODENOSCOPY (EGD) WITH PROPOFOL N/A 03/28/2015   Procedure: ESOPHAGOGASTRODUODENOSCOPY (EGD) WITH PROPOFOL;  Surgeon: Lucilla Lame, MD;  Location: [place];  Service: Endoscopy;  Laterality: N/A;   ESOPHAGOGASTRODUODENOSCOPY (EGD) WITH PROPOFOL N/A 06/30/2020   Procedure: ESOPHAGOGASTRODUODENOSCOPY (EGD) WITH PROPOFOL;  Surgeon: Lucilla Lame, MD;  Location: [place];  Service: Endoscopy;  Laterality: N/A;   ESOPHAGOGASTRODUODENOSCOPY (EGD) WITH PROPOFOL N/A 09/11/2020   Procedure: ESOPHAGOGASTRODUODENOSCOPY (EGD) WITH PROPOFOL;  Surgeon: Lucilla Lame, MD;  Location: [place];  Service: Endoscopy;  Laterality: N/A;   INSERTION OF MESH N/A 04/02/2015   Procedure: INSERTION OF MESH;  Surgeon: Florene Glen, MD;  Location: [facility];  Service: General;  Laterality: N/A;   LEFT AND RIGHT HEART CATHETERIZATION WITH  CORONARY ANGIOGRAM N/A 07/30/2013   Procedure: LEFT AND RIGHT HEART CATHETERIZATION WITH CORONARY ANGIOGRAM;  Surgeon: Leonie Man, MD;  Location: [facility] CATH LAB;  Service: Cardiovascular;  2 separate P & mLAD lesions -> PCI, MOd-Severe dRCA disesase with diffuse RPL/PDA  disease (FFR)   LEFT HEART CATH  08/03/2013   Procedure: LEFT HEART CATH;  Surgeon: Leonie Man, MD;  Location: [facility] CATH LAB;  Service: Cardiovascular;;FFR of RCA - non-flow-limiting   LEFT HEART CATH AND CORONARY ANGIOGRAPHY N/A 05/19/2016   Procedure: Left Heart Cath and Coronary Angiography;  Surgeon: Leonie Man, MD;  Location: [place] [facility];  Service: Cardiovascular: CULPRIT - 85% pOM1 pre-stent. Stents in both proximal and distal RCA widely patent. PTCA sites in RPL & PDA widely patent with stable 70% small RPL 2. Extensive stenting in the LAD widely patent stents with mild to mod Dz btw prox & mid-distal stents.    LEFT HEART CATH AND CORONARY ANGIOGRAPHY N/A 02/13/2018   Procedure: LEFT HEART CATH AND CORONARY ANGIOGRAPHY;  Surgeon: Leonie Man, MD;  Location: [place] CV LAB;  Service: Cardiovascular: Similar findings to last cath post PCI.  Stents are patent.  Roughly 50% proximal LAD lesion noted, may be progression from prior cath but otherwise stable disease.   LEFT HEART CATHETERIZATION WITH CORONARY ANGIOGRAM N/A 07/07/2011   Procedure: LEFT HEART CATHETERIZATION WITH CORONARY ANGIOGRAM;  Surgeon: Leonie Man, MD;  Location: [facility] CATH LAB;  Service: Cardiovascular;  inferolat post LV infarct ST-elevation MI   MET/CPET  11/09/2011   submax. effort 1.05 RER peak V02 was 51% ,chronotropic incomp.hrt rate lows 80s to 100   MET/CPET  September 2016   [facility]: Mild functional impairment due primarily to mild pulmonary and circulatory limitations. Also suggest physical deconditioning (seen with low-normal aerobic reserve). Mild VQ mismatch with exercise.  Ventilatory reserve exhausted with peak exercise demonstrated pulmonary limitation. No significant decrease in postexercise FEV1 compared to rest --> suggest no exercise induced bronchospasm   NM MYOVIEW ([facility] HX)  11/30/2011   EF 45% ,exercise 10 METS, infarct\scar w mild perinfarct ischemia -basal inferolat and mid inferolat  region   NM MYOVIEW LTD  04/25/2016   Exercised for 9 minutes. Reached 90% max. Heart rate. 9.4 METS. Read as a low risk study with normal EF 55-65%. -> on Review -  there does appear to be a small to moderate sized, medium severtiy reversible perfusion defect in the anterior wall - read by computer, but not noted by reader.   PERCUTANEOUS CORONARY STENT INTERVENTION (PCI-S)  07/07/2011   Procedure: PERCUTANEOUS CORONARY STENT INTERVENTION (PCI-S);  Surgeon: Leonie Man, MD;  Location: [facility] CATH LAB;  Service: Cardiovascular;;occluded RCA ,Integrity Resolute DES 2.75 x 18 mm stent - post-dilated 3.1 mm   PERCUTANEOUS CORONARY STENT INTERVENTION (PCI-S)  07/30/2013   Procedure: PERCUTANEOUS CORONARY STENT INTERVENTION (PCI-S);  Surgeon: Leonie Man, MD;  Location: [facility] CATH LAB;  Service: Cardiovascular;;Distal mid LAD: Xience Alpine DES 2.25 mm x 28 mm (overlapping proximal and distal edge of previous stent) - 2.7 mm; proximal LAD 2.5 mm x 12 mm Xience Alpine DES (2.8 mm);; RCA 60-70% stenosis planned staged procedure   PERCUTANEOUS CORONARY STENT INTERVENTION (PCI-S) N/A 11/15/2013   Procedure: PERCUTANEOUS CORONARY STENT INTERVENTION (PCI-S);  Surgeon: Leonie Man, MD;  Location: [facility] CATH LAB;  Service: Cardiovascular;  Patent Cx & LAD Stents; PCI -dRCA Promus Premier DES 4.0 mm x 16 mm (4.25 mm) dRCA, 2.0 mm Cutting PTCA  of RPL2 [place] & POBA of mRPDA   POLYPECTOMY  03/28/2015   Procedure: POLYPECTOMY;  Surgeon: Lucilla Lame, MD;  Location: [place];  Service: Endoscopy;;   POLYPECTOMY N/A 06/30/2020   Procedure: POLYPECTOMY;  Surgeon: Lucilla Lame, MD;  Location: [place];  Service: Endoscopy;  Laterality: N/A;   RIGHT HEART CATH  June 2015   Normal pressures with severely reduced cardiac output and index. (3.25/1.55)   TRANSTHORACIC ECHOCARDIOGRAM  07/31/2013; February 2017   a. LVEF 45-50. Inferior posterior hypokinesis with mild dilation. Apparent normal diastolic  pressures;    UMBILICAL HERNIA REPAIR N/A 04/02/2015   Procedure: HERNIA REPAIR UMBILICAL ADULT;  Surgeon: Florene Glen, MD;  Location: [facility];  Service: General;  Laterality: N/A;   05/2016; OM1 PCI: SYNERGY DES 2.5X16 -overlaps OLD stent. Cath February 13, 2018 similar findings when compared to prior cath.      Immunization History  Administered Date(s) Administered   Influenza Split 11/09/2012   Influenza,inj,Quad PF,6+ Mos 11/16/2013, 11/29/2014, 11/11/2015, 11/10/2016, 12/01/2017, 12/07/2018, 12/26/2019   Moderna Sars-Covid-2 Vaccination 04/25/2019, 05/14/2019, 01/10/2020   Pneumococcal Conjugate-13 11/29/2014, 05/05/2020   Pneumococcal Polysaccharide-23 07/11/2011, 11/10/2016, 01/02/2019   Tdap 03/04/2015    MEDICATIONS/ALLERGIES   Current Meds  Medication Sig   albuterol (VENTOLIN HFA) 108 (90 Base) MCG/ACT inhaler INHALE TWO PUFFS BY MOUTH EVERY 6 HOURS AS NEEDED FOR SHORTNESS OF BREATH   clobetasol cream (TEMOVATE) 0.05 % APPLY TOPICALLY TO AFFECTED AREA(S) DAILY AS NEEDED FOR ECZEMA (Patient taking differently: Apply 1 application topically daily as needed (eczema).)   EFFIENT 10 MG TABS tablet Take 10 mg by mouth daily.   fenofibrate (TRICOR) 48 MG tablet TAKE ONE TABLET BY MOUTH DAILY   Fluticasone-Salmeterol (ADVAIR) 100-50 MCG/DOSE AEPB Inhale 1 puff into the lungs in the morning and at bedtime.   ketoconazole (NIZORAL) 2 % shampoo Apply 1 application topically 2 (two) times a week.   Melatonin 1 MG CAPS Take 1 mg by mouth at bedtime as needed (sleep).   metoprolol succinate (TOPROL-XL) 25 MG 24 hr tablet TAKE 1.5 TABLETS BY MOUTH EVERY EVENING   nitroGLYCERIN (NITROSTAT) 0.4 MG SL tablet Place 1 tablet (0.4 mg total) under the tongue every 5 (five) minutes as needed for chest pain.   Omega-3 Fatty Acids (FISH OIL) 1000 MG CPDR Take 3 g by mouth daily.   omeprazole (PRILOSEC) 40 MG capsule Take 1 capsule (40 mg total) by mouth daily.   rosuvastatin (CRESTOR) 40 MG  tablet TAKE ONE TABLET BY MOUTH DAILY    Allergies  Allergen Reactions   Niacin And Related Hives and Itching    SOCIAL HISTORY/FAMILY HISTORY   Reviewed in Epic:  Pertinent findings:  Social History   Tobacco Use   Smoking status: Every Day    Packs/day: 0.50    Years: 40.00    Pack years: 20.00    Types: Cigarettes    Last attempt to quit: 01/19/2017    Years since quitting: 3.8   Smokeless tobacco: Never   Tobacco comments:    Quit with Bupropion $RemoveBefore'150mg'[id number]$  - > unfortunately, he restarted  Vaping Use   Vaping Use: Never used  Substance Use Topics   Alcohol use: Yes    Comment: rare   Drug use: No   Social History   Social History Narrative   He is a married father of 49, grandfather of 45. He does not really get routine exercise.    He is down to 5 or 6 cigarettes a  day. He is very seriously wanting to quit. This is a significant cutback for him, but he has pretty much been told he needs to stop by his wife, and so he fully intends to do so. He is willing to try the patches or whatever. He has a social alcoholic beverage every now and then.     OBJCTIVE -PE, EKG, labs   Wt Readings from Last 3 Encounters:  11/04/20 176 lb (79.8 kg)  10/10/20 175 lb 3.2 oz (79.5 kg)  09/11/20 177 lb (80.3 kg)    Physical Exam: BP 140/90 (BP Location: Right Arm)   Pulse 91   Ht 5\' 11"  (1.803 m)   Wt 176 lb (79.8 kg)   SpO2 96%   BMI 24.55 kg/m  Physical Exam Constitutional:      General: He is not in acute distress.    Appearance: Normal appearance. He is normal weight. He is not toxic-appearing.     Comments: Well-groomed.  Healthy-appearing.  Notable weight loss.  Looks and feels good.  HENT:     Head: Normocephalic and atraumatic.  Neck:     Vascular: No carotid bruit.  Cardiovascular:     Rate and Rhythm: Normal rate and regular rhythm.     Pulses: Normal pulses.     Heart sounds: Normal heart sounds. No murmur heard.   No friction rub. No gallop.  Pulmonary:      Effort: Pulmonary effort is normal. No respiratory distress.     Breath sounds: Normal breath sounds. No wheezing, rhonchi or rales.  Chest:     Chest wall: No tenderness.  Musculoskeletal:        General: No swelling. Normal range of motion.     Cervical back: Normal range of motion and neck supple.  Skin:    General: Skin is warm and dry.  Neurological:     General: No focal deficit present.     Mental Status: He is alert and oriented to person, place, and time.     Gait: Gait normal.  Psychiatric:        Mood and Affect: Mood normal.        Behavior: Behavior normal.        Thought Content: Thought content normal.        Judgment: Judgment normal.     Comments: Very happy mood.  Smiling.    No bruit   Adult ECG Report  Rate: 91 ;  Rhythm: normal sinus rhythm and normal axis, intervals & durations ;   Narrative Interpretation: stable - normal EKG  Recent Labs:  reviewed  Lab Results  Component Value Date   CHOL 117 05/05/2020   HDL 36.40 (L) 05/05/2020   LDLCALC 62 05/05/2020   LDLDIRECT 53.0 07/05/2018   TRIG 91.0 05/05/2020   CHOLHDL 3 05/05/2020   Lab Results  Component Value Date   CREATININE 0.85 05/05/2020   BUN 13 05/05/2020   NA 140 05/05/2020   K 4.4 05/05/2020   CL 106 05/05/2020   CO2 25 05/05/2020   CBC Latest Ref Rng & Units 05/05/2020 11/15/2019 07/05/2018  WBC 4.0 - 10.5 K/uL 12.7(H) 13.1(H) 14.1(H)  Hemoglobin 13.0 - 17.0 g/dL 07/07/2018 29.9 88.5  Hematocrit 39.0 - 52.0 % 49.2 51.9(H) 48.9  Platelets 150.0 - 400.0 K/uL 355.0 391 375.0    Lab Results  Component Value Date   HGBA1C 6.3 05/05/2020   Lab Results  Component Value Date   TSH 2.330 10/12/2016    ==================================================  COVID-19 Education: The signs and symptoms of COVID-19 were discussed with the patient and how to seek care for testing (follow up with PCP or arrange E-visit).    I spent a total of 34 minutes with the patient spent in direct patient  consultation.  Additional time spent with chart review  / charting (studies, outside notes, etc): 22 min Total Time: 56 min  Current medicines are reviewed at length with the patient today.  (+/- concerns) none  This visit occurred during the SARS-CoV-2 public health emergency.  Safety protocols were in place, including screening questions prior to the visit, additional usage of staff PPE, and extensive cleaning of exam room while observing appropriate contact time as indicated for disinfecting solutions.  Notice: This dictation was prepared with Dragon dictation along with smart phrase technology. Any transcriptional errors that result from this process are unintentional and may not be corrected upon review.  Patient Instructions / Medication Changes & Studies & Tests Ordered   Patient Instructions  Medication Instructions:   No changes   *If you need a refill on your cardiac medications before your next appointment, please call your pharmacy*   Lab Work: hgbA1C Lipid If you have labs (blood work) drawn today and your tests are completely normal, you will receive your results only by: [place] (if you have MyChart) OR A paper copy in the mail If you have any lab test that is abnormal or we need to change your treatment, we will call you to review the results.   Testing/Procedures: Not needed   Follow-Up: At [facility], you and your health needs are our priority.  As part of our continuing mission to provide you with exceptional heart care, we have created designated Provider Care Teams.  These Care Teams include your primary Cardiologist (physician) and Advanced Practice Providers (APPs -  Physician Assistants and Nurse Practitioners) who all work together to provide you with the care you need, when you need it.     Your next appointment:   6 month(s)  The format for your next appointment:   In Person  Provider:   Glenetta Hew, MD   Other Instructions    You are cleared for surgery from a cardiac standpoint - Low risk-   Stop your Effient 9 days prior to surgery and restart once it is okay by  surgery.   Studies Ordered:   Orders Placed This Encounter  Procedures   Hemoglobin A1c   Lipid panel   EKG 12-Lead      Glenetta Hew, M.D., M.S. Interventional Cardiologist   Pager # [phone] Phone # [phone] [address] [place], [place] [address]   Thank you for choosing Heartcare at [facility]!!

## 2020-11-04 NOTE — Patient Instructions (Signed)
Medication Instructions:   No changes   *If you need a refill on your cardiac medications before your next appointment, please call your pharmacy*   Lab Work: hgbA1C Lipid If you have labs (blood work) drawn today and your tests are completely normal, you will receive your results only by: [place] (if you have MyChart) OR A paper copy in the mail If you have any lab test that is abnormal or we need to change your treatment, we will call you to review the results.   Testing/Procedures: Not needed   Follow-Up: At [facility], you and your health needs are our priority.  As part of our continuing mission to provide you with exceptional heart care, we have created designated Provider Care Teams.  These Care Teams include your primary Cardiologist (physician) and Advanced Practice Providers (APPs -  Physician Assistants and Nurse Practitioners) who all work together to provide you with the care you need, when you need it.     Your next appointment:   6 month(s)  The format for your next appointment:   In Person  Provider:   Glenetta Hew, MD   Other Instructions   You are cleared for surgery from a cardiac standpoint - Low risk-   Stop your Effient 9 days prior to surgery and restart once it is okay by  surgery.

## 2020-11-04 NOTE — Assessment & Plan Note (Addendum)
Chronic stable angina with multivessel CAD-in the past has had multiple MIs noted below.  Essentially every major vascular group has had an MI.Troy Parrish  He has stents in the RCA, LAD and major OM1 branch.  Thankfully, no further anginal symptoms.  He is very active but not doing routine exercise.  Plan:  Continue low-dose Toprol for now, low threshold to consider adding ARB not on antiplatelet agent as he is on Effient. ->  Continue maintenance dose Effient which can be hold 9 days preop for surgery or surgery.   Continue current dose of rosuvastatin and fenofibrate for most recent labs are outstanding.

## 2020-11-05 ENCOUNTER — Telehealth: Payer: Self-pay

## 2020-11-05 NOTE — Telephone Encounter (Signed)
Patient called my number directly and offered patient a appointment on 11/17/20 and he said his wife might be off work that week and can not do that week. He said he would like to keep it as it is schedule for now on 12/19/2020 he states he will stop the Effient 9 days before the banding

## 2020-11-05 NOTE — Telephone Encounter (Signed)
Called and left a message for call back to schedule banding and inform him about holding Effient before banding

## 2020-11-05 NOTE — Telephone Encounter (Signed)
-----   Message from Lin Landsman, MD sent at 11/05/2020  8:36 AM EDT ----- Regarding: Schedule follow-up Thank you  Caryl Pina  Could you please bring him for a follow-up visit for hemorrhoid banding and make sure he can stop Effient as instructed below  Thanks RV ----- Message ----- From: Leonie Man, MD Sent: 11/04/2020   8:38 PM EDT To: Lin Landsman, MD  OK to treat hemorrhoids-- NYHA-1 CHF & CCS1 angina.  Doing markedly well.  He has a symptoms with well-controlled pressures etc., would simply proceed to the OR.  Need to hold Effient 9 days preop and restart when safe postop.  Glenetta Hew, MD

## 2020-11-05 NOTE — Telephone Encounter (Signed)
Pt. Returning call.

## 2020-11-07 NOTE — Assessment & Plan Note (Signed)
He has extensive PCI with multiple vessels stented.  He is on lifelong Thienopyridine therapy with prasugrel/Effient.   Okay to hold prasugrel 9 days preop for surgeries or procedures.-Can restart 2 days postop.  He has an upcoming GI procedure/hemorrhoid procedure.  Okay to hold

## 2020-11-07 NOTE — Telephone Encounter (Signed)
Blood thinner request was faxed again to Dr. Allison Quarry office.

## 2020-11-10 NOTE — Telephone Encounter (Signed)
Patient had office visit with Dr Ellyn Hack .  Pre op was addressed   Paper clearance was  signed a faxed to  GI.

## 2020-11-13 ENCOUNTER — Other Ambulatory Visit: Payer: Self-pay | Admitting: Cardiology

## 2020-11-21 ENCOUNTER — Ambulatory Visit: Payer: PPO | Admitting: Gastroenterology

## 2020-11-26 NOTE — Telephone Encounter (Signed)
Provider:   Glenetta Hew, MD     Other Instructions   You are cleared for surgery from a cardiac standpoint - Low risk-   Stop your Effient 9 days prior to surgery and restart once it is okay by  surgery.  Patient was notified by Dr. Ellyn Hack and Caryl Pina.

## 2020-11-27 ENCOUNTER — Other Ambulatory Visit: Payer: Self-pay | Admitting: Internal Medicine

## 2020-11-27 DIAGNOSIS — I5042 Chronic combined systolic (congestive) and diastolic (congestive) heart failure: Secondary | ICD-10-CM

## 2020-11-28 NOTE — Telephone Encounter (Signed)
Refill request Advair Last refill 06/04/20 #180/1 Last office visit 05/05/20

## 2020-12-03 ENCOUNTER — Other Ambulatory Visit: Payer: Self-pay

## 2020-12-03 ENCOUNTER — Ambulatory Visit (INDEPENDENT_AMBULATORY_CARE_PROVIDER_SITE_OTHER): Payer: PPO

## 2020-12-03 DIAGNOSIS — Z23 Encounter for immunization: Secondary | ICD-10-CM

## 2020-12-08 ENCOUNTER — Other Ambulatory Visit: Payer: Self-pay | Admitting: Internal Medicine

## 2020-12-08 DIAGNOSIS — I5042 Chronic combined systolic (congestive) and diastolic (congestive) heart failure: Secondary | ICD-10-CM

## 2020-12-08 NOTE — Telephone Encounter (Signed)
Requested medications are due for refill today yes  Requested medications are on the active medication list yes  Last refill 08/28/20  Last visit 05/05/20 at [facility], has had contact with this office in reference to a flu shot.  Future visit scheduled no  Notes to clinic was sent to [facility] last week, no upcoming visit but contact made with [facility], please assess.  Requested Prescriptions  Pending Prescriptions Disp Refills   fluticasone-salmeterol (ADVAIR) 100-50 MCG/ACT AEPB [Pharmacy Med Name: FLUTICASONE-SALMETEROL 100-50] 180 each 1    Sig: INHALE ONE PUFF BY MOUTH TWICE A DAY - IN THE MORNING AND AT BEDTIME     There is no refill protocol information for this order

## 2020-12-09 ENCOUNTER — Ambulatory Visit: Payer: Self-pay | Admitting: *Deleted

## 2020-12-09 NOTE — Telephone Encounter (Signed)
Pt called in stating he was out of his Advair inhaler.   He has been out for 4 days.   His allergies are bothering his COPD.   The refill was denied because he needs an appt.  He is having a procedure done on Nov. 11 and it had to be postponed is why he hasn't made an appt.   Transportation is also an issue.  I made him an appt with Webb Silversmith, NP for 12/23/2020 at 9:20 at his request.   He is needing a refill on the Advair.  I sent his request to [facility] explaining his situation.

## 2020-12-09 NOTE — Telephone Encounter (Signed)
Reason for Disposition  [1] Caller has URGENT medicine question about med that PCP or specialist prescribed AND [2] triager unable to answer question  Answer Assessment - Initial Assessment Questions 1. NAME of MEDICATION: "What medicine are you calling about?"     Advair inhaler 2. QUESTION: "What is your question?" (e.g., double dose of medicine, side effect)     I need it refilled.   I've been out of it.   3. PRESCRIBING HCP: "Who prescribed it?" Reason: if prescribed by specialist, call should be referred to that group.     Webb Silversmith, NP I have a procedure on Nov. 11th and I'll make an appt after that.   I've never had it denied before. 4. SYMPTOMS: "Do you have any symptoms?"     I'm having a hard time with allergies.   I went pharmacy yesterday and they told told me it was denied.   I need an appt. 5. SEVERITY: If symptoms are present, ask "Are they mild, moderate or severe?"     I'll do it after the procedure. 6. PREGNANCY:  "Is there any chance that you are pregnant?" "When was your last menstrual period?"     N/A  Protocols used: Medication Question Call-A-AH

## 2020-12-10 ENCOUNTER — Other Ambulatory Visit: Payer: Self-pay | Admitting: Internal Medicine

## 2020-12-10 DIAGNOSIS — I5042 Chronic combined systolic (congestive) and diastolic (congestive) heart failure: Secondary | ICD-10-CM

## 2020-12-10 NOTE — Telephone Encounter (Signed)
I called and left a message on the patient vm that his prescription was sent over to his pharmacy. I also informed him to keep his upcoming appt so he can get refills on his medications.

## 2020-12-10 NOTE — Telephone Encounter (Signed)
Requested medication (s) are due for refill today - yes  Requested medication (s) are on the active medication list -yes  Future visit scheduled -yes  Last refill: 06/04/20 #180 1 RF  Notes to clinic: Request RF: patient has appointment scheduled with provider- 1 week- sent for review  Requested Prescriptions  Pending Prescriptions Disp Refills   fluticasone-salmeterol (ADVAIR) 100-50 MCG/ACT AEPB [Pharmacy Med Name: FLUTICASONE-SALMETEROL 100-50] 180 each 1    Sig: INHALE ONE PUFF BY MOUTH TWICE A DAY - IN THE MORNING AND AT BEDTIME     Pulmonology:  [facility] [employer] Failed - 12/10/2020 10:02 AM      Failed - Valid encounter within last 12 months    Recent Outpatient Visits   None     Future Appointments             In 1 week Vanga, Tally Due, MD [place]   In 1 week Jearld Fenton, NP [facility], [place]   In 4 months Ellyn Hack, Leonie Green, MD [facility], [facility]               Requested Prescriptions  Pending Prescriptions Disp Refills   fluticasone-salmeterol (ADVAIR) 100-50 MCG/ACT AEPB [Pharmacy Med Name: FLUTICASONE-SALMETEROL 100-50] 180 each 1    Sig: INHALE ONE PUFF BY MOUTH TWICE A DAY - IN THE MORNING AND AT BEDTIME     Pulmonology:  [employer] Failed - 12/10/2020 10:02 AM      Failed - Valid encounter within last 12 months    Recent Outpatient Visits   None     Future Appointments             In 1 week Vanga, Tally Due, MD [place]   In 1 week Garnette Gunner, Coralie Keens, NP [facility], [place]   In 4 months Ellyn Hack, Leonie Green, MD [facility], [facility]

## 2020-12-19 ENCOUNTER — Ambulatory Visit: Payer: PPO | Admitting: Gastroenterology

## 2020-12-19 ENCOUNTER — Encounter: Payer: Self-pay | Admitting: Gastroenterology

## 2020-12-19 VITALS — BP 101/66 | HR 84 | Temp 97.8°F | Ht 71.0 in | Wt 174.0 lb

## 2020-12-19 DIAGNOSIS — K640 First degree hemorrhoids: Secondary | ICD-10-CM

## 2020-12-19 NOTE — Progress Notes (Signed)
Troy Darby, MD [facility]  [place], [place] [address]  Main: [phone]  Fax: [phone]    Gastroenterology Consultation  Referring Provider:     Jearld Fenton, NP Primary Care Physician:  Troy Fenton, NP Primary Gastroenterologist:  Dr. Lucilla Parrish Reason for Consultation:     Symptomatic hemorrhoids        HPI:   Troy Parrish. is a 60 y.o. male referred by Dr. Jearld Fenton, NP  for consultation & management of symptomatic hemorrhoids.  Patient has history of coronary disease s/p PCI, on Effient, history of COPD is referred by Dr. Allen Norris to discuss about management of symptomatic hemorrhoids.  Patient reports that he has been intermittently experiencing hemorrhoidal symptoms such as swelling, discomfort, pain every 1 to 2 months.  The most recent flareup was in May 2022 prior to colonoscopy which was quite severe associated with intense pain that he could not even wipe.  He noticed rectal bleeding at that time.  He probably had a thrombosed external hemorrhoid that has spontaneously healed. Other than this recent flareup, his hemorrhoidal symptoms generally last for 24 to 48 hours and spontaneously resolved.  He does not have any other GI concerns today  Follow-up visit 12/19/2020 Patient is here for hemorrhoid banding.  We got clearance from his cardiologist to stop Effient for 9 days prior to banding and patient did the same.  He reports pressure and swelling of the hemorrhoids and is interested to undergo hemorrhoid ligation.  NSAIDs: None  Antiplts/Anticoagulants/Anti thrombotics: Effient  GI Procedures: Reviewed under procedures tab  Past Medical History:  Diagnosis Date   Bronchitis    Gets bronchitis almost every year   CAD S/P percutaneous coronary angioplasty 06/2011; 6/ & 11/2013   S/P PCI to all 3 major vessels;a)  Ant STEMI 2001- BMS-LAD x2 -->(redo PCI 6/'15 -- pLAD Xience DES 2.5 x 12- 2.75 mm, mLAD 2.25 x 12 - 2.7 mm), b) '06  UA --> Cx- OM DES; c) 2013 Inf MI BMS mRCA --> d) 10/'15 PCI dRCA Promus P DES 4.0 x 16 (4.25 mm); PTCA of RPL2 (2.0 mm) &RPDA (2.25 mm) 11/2013; 4/18 PCI OM1 Synergy DES  2.5 x 16   COPD (chronic obstructive pulmonary disease) (HCC)    Elevated WBC count    Emphysema of lung ([place])    Essential hypertension 07/07/2011   Former heavy tobacco smoker     quit in May 2015 after multiple attempts at trying to quit before    GERD (gastroesophageal reflux disease)    Glucose intolerance (pre-diabetes) June 2015    hemoglobin A1c 6.6   Headache    "Imdur related; stopped taking it; headaches went away" (11/15/2013)   Heart murmur    History of colon polyps    History of stomach ulcers    Hyperlipidemia with target LDL less than 70 4/50/3888   Metabolic syndrome    Pre-diabetes, hypertension and truncal obesity as well as dyslipidemia   OSA on CPAP    Pneumonia "several times"   Recurrent upper respiratory infection (URI)    Seizures ([place]) "several"   "last one was ~ 2011" (11/15/2013)   Shortness of breath dyspnea    ST elevation myocardial infarction (STEMI) of anterior wall ([place]) 2001   History of -- 2 stents in early and distal mid LAD; prior cardiologist was Dr. Remi Haggard in [place] elevation myocardial infarction (STEMI) of inferior wall ([place]) 07/07/2011  Occluded RCA 2.75X18 INTEGRITY; Echo 07/2013: EF 45-50%, Inferior & Posterolateral HK.    Umbilical hernia    Unstable angina ([place]) 2006; 07/2013   Cx-OM - PCI 2.5 mm 13 mm Cypher DES [name], [place]) ; 07/2013: Severe ISR of both prox & Distal LAD stentS --> 2 DES stents (1 at prox edge of the proximal stent, 2nd covers the entire distal stent as well as proximal and distal edge stenose)    Upper respiratory infection     Past Surgical History:  Procedure Laterality Date   BIOPSY N/A 06/30/2020   Procedure: BIOPSY;  Surgeon: Troy Lame, MD;  Location: [facility];  Service: Endoscopy;  Laterality: N/A;   Cardiac  MRI [facility]  09/2014   EF 51%. Mod HK of basal-mid Inf wall, mild HK of basal inferoseptum & basal-mid inferolateral wall with hyper enhancement (c/w subendocard RCA MI with significant viability), focal hyper enhancement of apical wall c/w dLAD subendocard MI & complete LAD viability. . No aortic stenosis. Normal RV function. No Ischemia on Adenosine Stress.   COLONOSCOPY WITH PROPOFOL N/A 03/28/2015   Procedure: COLONOSCOPY WITH PROPOFOL;  Surgeon: Troy Lame, MD;  Location: [facility];  Service: Endoscopy;  Laterality: N/A;   COLONOSCOPY WITH PROPOFOL N/A 06/30/2020   Procedure: COLONOSCOPY WITH PROPOFOL;  Surgeon: Troy Lame, MD;  Location: [facility];  Service: Endoscopy;  Laterality: N/A;   CORONARY ANGIOPLASTY WITH STENT PLACEMENT  2001   ANTERIOR mi with a  PCI to LAD; [place], [place] - Dr. Dorris Parrish   CORONARY ANGIOPLASTY WITH STENT PLACEMENT  2006   [place] by Dr Troy Parrish -lesion lft circ/OM1 with 2.5 x 36m Cypher DES   CORONARY STENT INTERVENTION N/A 05/19/2016   Procedure: Coronary Stent Intervention;  Surgeon: DLeonie Man MD;  [facility];  Service: Cardiovascular: PCI pOM1 - Synergy DES 2.5 x 16 (overlaps old stent)   ESOPHAGOGASTRODUODENOSCOPY (EGD) WITH PROPOFOL N/A 03/28/2015   Procedure: ESOPHAGOGASTRODUODENOSCOPY (EGD) WITH PROPOFOL;  Surgeon: DLucilla Lame MD;  [place] [facility]  Service: Endoscopy;  Laterality: N/A;   ESOPHAGOGASTRODUODENOSCOPY (EGD) WITH PROPOFOL N/A 06/30/2020   Procedure: ESOPHAGOGASTRODUODENOSCOPY (EGD) WITH PROPOFOL;  Surgeon: WLucilla Lame MD;  [place] [facility]  Service: Endoscopy;  Laterality: N/A;   ESOPHAGOGASTRODUODENOSCOPY (EGD) WITH PROPOFOL N/A 09/11/2020   Procedure: ESOPHAGOGASTRODUODENOSCOPY (EGD) WITH PROPOFOL;  Surgeon: WLucilla Lame MD;  [facility] [facility]  Service: Endoscopy;  Laterality: N/A;   INSERTION OF MESH N/A 04/02/2015   Procedure: INSERTION OF MESH;  Surgeon:  RFlorene Glen MD;  Location: [facility];  Service: General;  Laterality: N/A;   LEFT AND RIGHT HEART CATHETERIZATION WITH CORONARY ANGIOGRAM N/A 07/30/2013   Procedure: LEFT AND RIGHT HEART CATHETERIZATION WITH CORONARY ANGIOGRAM;  Surgeon: DLeonie Man MD;  [facility];  Service: Cardiovascular;  2 separate P & mLAD lesions -> PCI, MOd-Severe dRCA disesase with diffuse RPL/PDA disease (FFR)   LEFT HEART CATH  08/03/2013   Procedure: LEFT HEART CATH;  Surgeon: DLeonie Man MD;  [facility];  Service: Cardiovascular;;FFR of RCA - non-flow-limiting   LEFT HEART CATH AND CORONARY ANGIOGRAPHY N/A 05/19/2016   Procedure: Left Heart Cath and Coronary Angiography;  Surgeon: DLeonie Man MD;  [facility];  Service: Cardiovascular: CULPRIT - 85% pOM1 pre-stent. Stents in both proximal and distal RCA widely patent. PTCA sites in RPL & PDA widely patent with stable 70% small RPL 2. Extensive stenting in the  LAD widely patent stents with mild to mod Dz btw prox & mid-distal stents.    LEFT HEART CATH AND CORONARY ANGIOGRAPHY N/A 02/13/2018   Procedure: LEFT HEART CATH AND CORONARY ANGIOGRAPHY;  Surgeon: Leonie Man, MD;  Location: [place] [facility];  Service: Cardiovascular: Similar findings to last cath post PCI.  Stents are patent.  Roughly 50% proximal LAD lesion noted, may be progression from prior cath but otherwise stable disease.   LEFT HEART CATHETERIZATION WITH CORONARY ANGIOGRAM N/A 07/07/2011   Procedure: LEFT HEART CATHETERIZATION WITH CORONARY ANGIOGRAM;  Surgeon: Leonie Man, MD;  Location: [facility];  Service: Cardiovascular;  inferolat post LV infarct ST-elevation MI   MET/CPET  11/09/2011   submax. effort 1.05 RER peak V02 was 51% ,chronotropic incomp.hrt rate lows 80s to 100   MET/CPET  September 2016   [facility]: Mild functional impairment due primarily to mild pulmonary and circulatory limitations. Also suggest physical deconditioning (seen  with low-normal aerobic reserve). Mild VQ mismatch with exercise.  Ventilatory reserve exhausted with peak exercise demonstrated pulmonary limitation. No significant decrease in postexercise FEV1 compared to rest --> suggest no exercise induced bronchospasm   NM MYOVIEW ([facility] HX)  11/30/2011   EF 45% ,exercise 10 METS, infarct\scar w mild perinfarct ischemia -basal inferolat and mid inferolat region   NM MYOVIEW LTD  04/25/2016   Exercised for 9 minutes. Reached 90% max. Heart rate. 9.4 METS. Read as a low risk study with normal EF 55-65%. -> on Review -  there does appear to be a small to moderate sized, medium severtiy reversible perfusion defect in the anterior wall - read by computer, but not noted by reader.   PERCUTANEOUS CORONARY STENT INTERVENTION (PCI-S)  07/07/2011   Procedure: PERCUTANEOUS CORONARY STENT INTERVENTION (PCI-S);  Surgeon: Leonie Man, MD;  Location: [facility] CATH LAB;  Service: Cardiovascular;;occluded RCA ,Integrity Resolute DES 2.75 x 18 mm stent - post-dilated 3.1 mm   PERCUTANEOUS CORONARY STENT INTERVENTION (PCI-S)  07/30/2013   Procedure: PERCUTANEOUS CORONARY STENT INTERVENTION (PCI-S);  Surgeon: Leonie Man, MD;  Location: [facility];  Service: Cardiovascular;;Distal mid LAD: Xience Alpine DES 2.25 mm x 28 mm (overlapping proximal and distal edge of previous stent) - 2.7 mm; proximal LAD 2.5 mm x 12 mm Xience Alpine DES (2.8 mm);; RCA 60-70% stenosis planned staged procedure   PERCUTANEOUS CORONARY STENT INTERVENTION (PCI-S) N/A 11/15/2013   Procedure: PERCUTANEOUS CORONARY STENT INTERVENTION (PCI-S);  Surgeon: Leonie Man, MD;  Location: [facility] CATH LAB;  Service: Cardiovascular;  Patent Cx & LAD Stents; PCI -dRCA Promus Premier DES 4.0 mm x 16 mm (4.25 mm) dRCA, 2.0 mm Cutting PTCA of RPL2 [place] & POBA of mRPDA   POLYPECTOMY  03/28/2015   Procedure: POLYPECTOMY;  Surgeon: Troy Lame, MD;  Location: [place];  Service: Endoscopy;;   POLYPECTOMY N/A 06/30/2020    Procedure: POLYPECTOMY;  Surgeon: Troy Lame, MD;  Location: [place] [facility];  Service: Endoscopy;  Laterality: N/A;   RIGHT HEART CATH  June 2015   Normal pressures with severely reduced cardiac output and index. (3.25/1.55)   TRANSTHORACIC ECHOCARDIOGRAM  07/31/2013; February 2017   a. LVEF 45-50. Inferior posterior hypokinesis with mild dilation. Apparent normal diastolic pressures;    UMBILICAL HERNIA REPAIR N/A 04/02/2015   Procedure: HERNIA REPAIR UMBILICAL ADULT;  Surgeon: Florene Glen, MD;  Location: [facility];  Service: General;  Laterality: N/A;    Current Outpatient Medications:    albuterol (VENTOLIN HFA) 108 (90 Base)  MCG/ACT inhaler, INHALE TWO PUFFS BY MOUTH EVERY 6 HOURS AS NEEDED FOR SHORTNESS OF BREATH, Disp: 24 g, Rfl: 1   clobetasol cream (TEMOVATE) 0.05 %, APPLY TOPICALLY TO AFFECTED AREA(S) DAILY AS NEEDED FOR ECZEMA (Patient taking differently: Apply 1 application topically daily as needed (eczema).), Disp: 30 g, Rfl: 4   fenofibrate (TRICOR) 48 MG tablet, TAKE ONE TABLET BY MOUTH DAILY, Disp: 90 tablet, Rfl: 3   fluticasone-salmeterol (ADVAIR) 100-50 MCG/ACT AEPB, INHALE ONE PUFF BY MOUTH TWICE A DAY - IN THE MORNING AND AT BEDTIME, Disp: 180 each, Rfl: 1   ketoconazole (NIZORAL) 2 % shampoo, Apply 1 application topically 2 (two) times a week., Disp: , Rfl:    Melatonin 1 MG CAPS, Take 1 mg by mouth at bedtime as needed (sleep)., Disp: , Rfl:    metoprolol succinate (TOPROL-XL) 25 MG 24 hr tablet, TAKE 1.5 TABLETS BY MOUTH EVERY EVENING, Disp: 145 tablet, Rfl: 3   nitroGLYCERIN (NITROSTAT) 0.4 MG SL tablet, Place 1 tablet (0.4 mg total) under the tongue every 5 (five) minutes as needed for chest pain., Disp: 25 tablet, Rfl: 3   Omega-3 Fatty Acids (FISH OIL) 1000 MG CPDR, Take 3 g by mouth daily., Disp: 30 capsule, Rfl: 11   omeprazole (PRILOSEC) 40 MG capsule, Take 1 capsule (40 mg total) by mouth daily., Disp: 90 capsule, Rfl: 3   rosuvastatin (CRESTOR) 40 MG  tablet, TAKE ONE TABLET BY MOUTH DAILY, Disp: 90 tablet, Rfl: 3   EFFIENT 10 MG TABS tablet, TAKE ONE TABLET BY MOUTH EVERY EVENING (Patient not taking: Reported on 12/19/2020), Disp: 90 tablet, Rfl: 1   fluticasone (FLONASE) 50 MCG/ACT nasal spray, Place 2 sprays into both nostrils daily. (Patient not taking: Reported on 10/10/2020), Disp: 16 g, Rfl: 0   MODERNA COVID-19 BIVAL BOOSTER 50 MCG/0.5ML injection, , Disp: , Rfl:   Family History  Problem Relation Age of Onset   Coronary artery disease Father    Heart disease Father    Stroke Father    Hypertension Father    Hyperlipidemia Mother    Diabetes Mother    Hyperlipidemia Maternal Grandmother    Diabetes Maternal Grandmother    Hyperlipidemia Maternal Grandfather    Diabetes Maternal Grandfather    Emphysema Maternal Grandfather    Heart disease Paternal Grandmother    Heart disease Paternal Grandfather    Cancer Maternal Aunt        breast     Social History   Tobacco Use   Smoking status: Every Day    Packs/day: 0.50    Years: 40.00    Pack years: 20.00    Types: Cigarettes    Last attempt to quit: 01/19/2017    Years since quitting: 3.9   Smokeless tobacco: Never   Tobacco comments:    Quit with Bupropion 110m - > unfortunately, he restarted  Vaping Use   Vaping Use: Never used  Substance Use Topics   Alcohol use: Yes    Comment: rare   Drug use: No    Allergies as of 12/19/2020 - Review Complete 12/19/2020  Allergen Reaction Noted   Niacin and related Hives and Itching 12/31/2013    Review of Systems:    All systems reviewed and negative except where noted in HPI.   Physical Exam:  BP 101/66 (BP Location: Left Arm, Patient Position: Sitting, Cuff Size: Normal)   Pulse 84   Temp 97.8 F (36.6 C) (Temporal)   Ht _0  (1.803 m)   Wt  174 lb (78.9 kg)   BMI 24.27 kg/m  No LMP for male patient.  General:   Alert,  Well-developed, well-nourished, pleasant and cooperative in NAD Head:  Normocephalic  and atraumatic. Eyes:  Sclera clear, no icterus.   Conjunctiva pink. Ears:  Normal auditory acuity. Nose:  No deformity, discharge, or lesions. Mouth:  No deformity or lesions,oropharynx pink & moist. Neck:  Supple; no masses or thyromegaly. Lungs:  Respirations even and unlabored.  Clear throughout to auscultation.   No wheezes, crackles, or rhonchi. No acute distress. Heart:  Regular rate and rhythm; no murmurs, clicks, rubs, or gallops. Abdomen:  Normal bowel sounds. Soft, non-tender and non-distended without masses, hepatosplenomegaly or hernias noted.  No guarding or rebound tenderness.   Rectal: Very small perianal skin tag, nontender digital rectal exam Msk:  Symmetrical without gross deformities. Good, equal movement & strength bilaterally. Pulses:  Normal pulses noted. Extremities:  No clubbing or edema.  No cyanosis. Neurologic:  Alert and oriented x3;  grossly normal neurologically. Skin:  Intact without significant lesions or rashes. No jaundice. Psych:  Alert and cooperative. Normal mood and affect.  Imaging Studies: Reviewed  Assessment and Plan:   Vraj Denardo. is a 60 y.o. male with coronary artery disease on Effient, fatty liver, colonic diverticulosis is seen in for follow-up of symptomatic hemorrhoids.  We have obtain clearance from his cardiologist and patient stop Effient 9 days ago.  I did discuss in detail regarding the hemorrhoid banding procedure, risks and benefits.  Patient is willing to undergo hemorrhoid ligation today.    Follow up as needed   Troy Darby, MD

## 2020-12-19 NOTE — Progress Notes (Signed)
PROCEDURE NOTE: The patient presents with symptomatic grade 1 hemorrhoids, unresponsive to maximal medical therapy, requesting rubber band ligation of his/her hemorrhoidal disease.  All risks, benefits and alternative forms of therapy were described and informed consent was obtained.  The decision was made to band the RA internal hemorrhoid, and the [place] was used to perform band ligation without complication.  Digital anorectal examination was then performed to assure proper positioning of the band, and to adjust the banded tissue as required.  The patient was discharged home without pain or other issues.  Dietary and behavioral recommendations were given and (if necessary - prescriptions were given), along with follow-up instructions.  The patient will return     as needed for follow-up and possible additional banding as required.  No complications were encountered and the patient tolerated the procedure well.  Advised patient to restart Effient after 5 days from today

## 2020-12-23 ENCOUNTER — Ambulatory Visit (INDEPENDENT_AMBULATORY_CARE_PROVIDER_SITE_OTHER): Payer: PPO | Admitting: Internal Medicine

## 2020-12-23 ENCOUNTER — Other Ambulatory Visit: Payer: Self-pay

## 2020-12-23 ENCOUNTER — Encounter: Payer: Self-pay | Admitting: Internal Medicine

## 2020-12-23 VITALS — BP 112/76 | HR 80 | Temp 97.5°F | Resp 16 | Ht 71.0 in | Wt 174.6 lb

## 2020-12-23 DIAGNOSIS — I255 Ischemic cardiomyopathy: Secondary | ICD-10-CM | POA: Diagnosis not present

## 2020-12-23 DIAGNOSIS — I2111 ST elevation (STEMI) myocardial infarction involving right coronary artery: Secondary | ICD-10-CM

## 2020-12-23 DIAGNOSIS — I25119 Atherosclerotic heart disease of native coronary artery with unspecified angina pectoris: Secondary | ICD-10-CM

## 2020-12-23 DIAGNOSIS — E785 Hyperlipidemia, unspecified: Secondary | ICD-10-CM

## 2020-12-23 DIAGNOSIS — H6123 Impacted cerumen, bilateral: Secondary | ICD-10-CM

## 2020-12-23 DIAGNOSIS — J439 Emphysema, unspecified: Secondary | ICD-10-CM

## 2020-12-23 DIAGNOSIS — I5032 Chronic diastolic (congestive) heart failure: Secondary | ICD-10-CM | POA: Diagnosis not present

## 2020-12-23 DIAGNOSIS — I10 Essential (primary) hypertension: Secondary | ICD-10-CM | POA: Diagnosis not present

## 2020-12-23 DIAGNOSIS — I2109 ST elevation (STEMI) myocardial infarction involving other coronary artery of anterior wall: Secondary | ICD-10-CM | POA: Diagnosis not present

## 2020-12-23 DIAGNOSIS — I5042 Chronic combined systolic (congestive) and diastolic (congestive) heart failure: Secondary | ICD-10-CM

## 2020-12-23 DIAGNOSIS — I208 Other forms of angina pectoris: Secondary | ICD-10-CM | POA: Diagnosis not present

## 2020-12-23 DIAGNOSIS — E118 Type 2 diabetes mellitus with unspecified complications: Secondary | ICD-10-CM | POA: Diagnosis not present

## 2020-12-23 DIAGNOSIS — K2100 Gastro-esophageal reflux disease with esophagitis, without bleeding: Secondary | ICD-10-CM

## 2020-12-23 DIAGNOSIS — G4733 Obstructive sleep apnea (adult) (pediatric): Secondary | ICD-10-CM | POA: Diagnosis not present

## 2020-12-23 DIAGNOSIS — I700 Atherosclerosis of aorta: Secondary | ICD-10-CM

## 2020-12-23 MED ORDER — FLUTICASONE-SALMETEROL 100-50 MCG/ACT IN AEPB: INHALATION_SPRAY | RESPIRATORY_TRACT | 1 refills | Status: DC

## 2020-12-23 MED ORDER — ALBUTEROL SULFATE HFA 108 (90 BASE) MCG/ACT IN AERS: INHALATION_SPRAY | RESPIRATORY_TRACT | 1 refills | Status: DC

## 2020-12-23 NOTE — Progress Notes (Signed)
Subjective:    Patient ID: Troy Floro., male    DOB: Nov 25, 1960, 60 y.o.   MRN: [id number]  HPI  Patient presents the clinic today for 51-month follow-up of chronic conditions.  CHF: He denies chronic cough or shortness of breath.  He is taking Metoprolol as prescribed.  Echocardiogram from 03/2015 reviewed.  HTN: His BP today is 112/76.  He is taking Metoprolol as prescribed.  ECG from 10/2020 reviewed.  HLD with CAD/angina status post MI: He has had stents placed.  His last LDL was 62, triglycerides 91, 04/2020.  He is taking Rosuvastatin, Fenofibrate, Metoprolol, Effient and Aspirin as prescribed.  He follows with cardiology.  OSA: He is not wearing a CPAP.  Sleep study from 04/2013 reviewed.  GERD: He denies breakthrough on Omeprazole.  Upper GI from 06/2020 reviewed.  COPD: He denies chronic cough or shortness of breath at this time.  He takes Advair as prescribed and uses Albuterol as needed.  PFTs from 04/2013 reviewed.  DM2: His last A1c was 6.3%, 04/2020.  He does not check his blood sugars but would like a machine so that he can start doing this.  He is not taking any oral diabetic medication at this time.  He checks his feet routinely.  His last eye exam was >1-year ago.    Review of Systems     Past Medical History:  Diagnosis Date   Bronchitis    Gets bronchitis almost every year   CAD S/P percutaneous coronary angioplasty 06/2011; 6/ & 11/2013   S/P PCI to all 3 major vessels;a)  Ant STEMI 2001- BMS-LAD x2 -->(redo PCI 6/'15 -- pLAD Xience DES 2.5 x 12- 2.75 mm, mLAD 2.25 x 12 - 2.7 mm), b) '06 UA --> Cx- OM DES; c) 2013 Inf MI BMS mRCA --> d) 10/'15 PCI dRCA Promus P DES 4.0 x 16 (4.25 mm); PTCA of RPL2 (2.0 mm) &RPDA (2.25 mm) 11/2013; 4/18 PCI OM1 Synergy DES  2.5 x 16   COPD (chronic obstructive pulmonary disease) (HCC)    Elevated WBC count    Emphysema of lung ([place])    Essential hypertension 07/07/2011   Former heavy tobacco smoker     quit in May 2015 after  multiple attempts at trying to quit before    GERD (gastroesophageal reflux disease)    Glucose intolerance (pre-diabetes) June 2015    hemoglobin A1c 6.6   Headache    "Imdur related; stopped taking it; headaches went away" (11/15/2013)   Heart murmur    History of colon polyps    History of stomach ulcers    Hyperlipidemia with target LDL less than 70 3/71/0626   Metabolic syndrome    Pre-diabetes, hypertension and truncal obesity as well as dyslipidemia   OSA on CPAP    Pneumonia "several times"   Recurrent upper respiratory infection (URI)    Seizures ([place]) "several"   "last one was ~ 2011" (11/15/2013)   Shortness of breath dyspnea    ST elevation myocardial infarction (STEMI) of anterior wall ([place]) 2001   History of -- 2 stents in early and distal mid LAD; prior cardiologist was Dr. Remi Haggard in [place] elevation myocardial infarction (STEMI) of inferior wall ([place]) 07/07/2011   Occluded RCA 2.75X18 INTEGRITY; Echo 07/2013: EF 45-50%, Inferior & Posterolateral HK.    Umbilical hernia    Unstable angina ([place]) 2006; 07/2013   Cx-OM - PCI 2.5 mm 13 mm Cypher DES [name], [place]) ; 07/2013: Severe  ISR of both prox & Distal LAD stentS --> 2 DES stents (1 at prox edge of the proximal stent, 2nd covers the entire distal stent as well as proximal and distal edge stenose)    Upper respiratory infection     Current Outpatient Medications  Medication Sig Dispense Refill   albuterol (VENTOLIN HFA) 108 (90 Base) MCG/ACT inhaler INHALE TWO PUFFS BY MOUTH EVERY 6 HOURS AS NEEDED FOR SHORTNESS OF BREATH 24 g 1   clobetasol cream (TEMOVATE) 0.05 % APPLY TOPICALLY TO AFFECTED AREA(S) DAILY AS NEEDED FOR ECZEMA (Patient taking differently: Apply 1 application topically daily as needed (eczema).) 30 g 4   EFFIENT 10 MG TABS tablet TAKE ONE TABLET BY MOUTH EVERY EVENING 90 tablet 1   fenofibrate (TRICOR) 48 MG tablet TAKE ONE TABLET BY MOUTH DAILY 90 tablet 3   fluticasone-salmeterol  (ADVAIR) 100-50 MCG/ACT AEPB INHALE ONE PUFF BY MOUTH TWICE A DAY - IN THE MORNING AND AT BEDTIME 180 each 1   ketoconazole (NIZORAL) 2 % shampoo Apply 1 application topically 2 (two) times a week.     Melatonin 1 MG CAPS Take 1 mg by mouth at bedtime as needed (sleep).     metoprolol succinate (TOPROL-XL) 25 MG 24 hr tablet TAKE 1.5 TABLETS BY MOUTH EVERY EVENING 145 tablet 3   MODERNA COVID-19 BIVAL BOOSTER 50 MCG/0.5ML injection      nitroGLYCERIN (NITROSTAT) 0.4 MG SL tablet Place 1 tablet (0.4 mg total) under the tongue every 5 (five) minutes as needed for chest pain. 25 tablet 3   Omega-3 Fatty Acids (FISH OIL) 1000 MG CPDR Take 3 g by mouth daily. 30 capsule 11   omeprazole (PRILOSEC) 40 MG capsule Take 1 capsule (40 mg total) by mouth daily. 90 capsule 3   rosuvastatin (CRESTOR) 40 MG tablet TAKE ONE TABLET BY MOUTH DAILY 90 tablet 3   fluticasone (FLONASE) 50 MCG/ACT nasal spray Place 2 sprays into both nostrils daily. (Patient not taking: Reported on 10/10/2020) 16 g 0   No current facility-administered medications for this visit.    Allergies  Allergen Reactions   Niacin And Related Hives and Itching    Family History  Problem Relation Age of Onset   Coronary artery disease Father    Heart disease Father    Stroke Father    Hypertension Father    Hyperlipidemia Mother    Diabetes Mother    Hyperlipidemia Maternal Grandmother    Diabetes Maternal Grandmother    Hyperlipidemia Maternal Grandfather    Diabetes Maternal Grandfather    Emphysema Maternal Grandfather    Heart disease Paternal Grandmother    Heart disease Paternal Grandfather    Cancer Maternal Aunt        breast    Social History   Socioeconomic History   Marital status: Married    Spouse name: Not on file   Number of children: Not on file   Years of education: Not on file   Highest education level: Not on file  Occupational History   Not on file  Tobacco Use   Smoking status: Every Day     Packs/day: 0.50    Years: 40.00    Pack years: 20.00    Types: Cigarettes   Smokeless tobacco: Never   Tobacco comments:    Quit with Bupropion 150mg  - > unfortunately, he restarted  Vaping Use   Vaping Use: Never used  Substance and Sexual Activity   Alcohol use: Yes    Comment: rare  Drug use: No   Sexual activity: Not on file  Other Topics Concern   Not on file  Social History Narrative   He is a married father of 67, grandfather of 89. He does not really get routine exercise.    He is down to 5 or 6 cigarettes a day. He is very seriously wanting to quit. This is a significant cutback for him, but he has pretty much been told he needs to stop by his wife, and so he fully intends to do so. He is willing to try the patches or whatever. He has a social alcoholic beverage every now and then.    Social Determinants of Health   Financial Resource Strain: Not on file  Food Insecurity: Not on file  Transportation Needs: Not on file  Physical Activity: Not on file  Stress: Not on file  Social Connections: Not on file  Intimate Partner Violence: Not on file     Constitutional: Denies fever, malaise, fatigue, headache or abrupt weight changes.  HEENT: Patient reports decreased hearing, wax buildup.  Denies eye pain, eye redness, ear pain, ringing in the ears, runny nose, nasal congestion, bloody nose, or sore throat. Respiratory: Denies difficulty breathing, shortness of breath, cough or sputum production.   Cardiovascular: Denies chest pain, chest tightness, palpitations or swelling in the hands or feet.  Gastrointestinal: Denies abdominal pain, bloating, constipation, diarrhea or blood in the stool.  GU: Denies urgency, frequency, pain with urination, burning sensation, blood in urine, odor or discharge. Musculoskeletal: Denies decrease in range of motion, difficulty with gait, muscle pain or joint pain and swelling.  Skin: Denies redness, rashes, lesions or ulcercations.   Neurological: Denies dizziness, difficulty with memory, difficulty with speech or problems with balance and coordination.  Psych: Denies anxiety, depression, SI/HI.  No other specific complaints in a complete review of systems (except as listed in HPI above).  Objective:   Physical Exam  BP 112/76 (BP Location: Right Arm, Patient Position: Sitting, Cuff Size: Large)   Pulse 80   Temp (!) 97.5 F (36.4 C) (Temporal)   Resp 16   Ht 5\' 11"  (1.803 m)   Wt 174 lb 9.6 oz (79.2 kg)   SpO2 100%   BMI 24.35 kg/m  Wt Readings from Last 3 Encounters:  12/23/20 174 lb 9.6 oz (79.2 kg)  12/19/20 174 lb (78.9 kg)  11/04/20 176 lb (79.8 kg)    General: Appears his stated age, well developed, well nourished in NAD. Skin: Warm, dry and intact. No ulcerations noted. HEENT: Head: normal shape and size; Eyes: sclera white and EOMs intact; Ears: Cerumen impaction bilaterally;  Cardiovascular: Normal rate and rhythm. S1,S2 noted.  No murmur, rubs or gallops noted. No JVD or BLE edema. No carotid bruits noted. Pulmonary/Chest: Normal effort and positive vesicular breath sounds. No respiratory distress. No wheezes, rales or ronchi noted.  Abdomen: Soft and nontender.  Musculoskeletal:  No difficulty with gait.  Neurological: Alert and oriented.  Psychiatric: Mood and affect normal. Behavior is normal. Judgment and thought content normal.    BMET    Component Value Date/Time   NA 140 05/05/2020 1304   NA 139 11/15/2019 1010   NA 139 10/24/2013 1050   K 4.4 05/05/2020 1304   K 3.8 10/24/2013 1050   CL 106 05/05/2020 1304   CL 107 10/24/2013 1050   CO2 25 05/05/2020 1304   CO2 24 10/24/2013 1050   GLUCOSE 112 (H) 05/05/2020 1304   GLUCOSE 108 (H) 10/24/2013  1050   BUN 13 05/05/2020 1304   BUN 16 11/15/2019 1010   BUN 8 10/24/2013 1050   CREATININE 0.85 05/05/2020 1304   CREATININE 1.01 05/13/2016 1044   CALCIUM 9.6 05/05/2020 1304   CALCIUM 8.2 (L) 10/24/2013 1050   GFRNONAA 70  11/15/2019 1010   GFRNONAA >60 10/24/2013 1050   GFRAA 81 11/15/2019 1010   GFRAA >60 10/24/2013 1050    Lipid Panel     Component Value Date/Time   CHOL 117 05/05/2020 1304   CHOL 141 11/15/2019 1010   TRIG 91.0 05/05/2020 1304   HDL 36.40 (L) 05/05/2020 1304   HDL 32 (L) 11/15/2019 1010   CHOLHDL 3 05/05/2020 1304   VLDL 18.2 05/05/2020 1304   LDLCALC 62 05/05/2020 1304   LDLCALC 73 11/15/2019 1010    CBC    Component Value Date/Time   WBC 12.7 (H) 05/05/2020 1304   RBC 5.71 05/05/2020 1304   HGB 16.6 05/05/2020 1304   HGB 17.7 11/15/2019 1010   HCT 49.2 05/05/2020 1304   HCT 51.9 (H) 11/15/2019 1010   PLT 355.0 05/05/2020 1304   PLT 391 11/15/2019 1010   MCV 86.2 05/05/2020 1304   MCV 88 11/15/2019 1010   MCV 89 10/24/2013 1050   MCH 30.0 11/15/2019 1010   MCH 30.4 05/13/2016 1044   MCHC 33.6 05/05/2020 1304   RDW 15.1 05/05/2020 1304   RDW 13.0 11/15/2019 1010   RDW 14.5 10/24/2013 1050   LYMPHSABS 4.6 (H) 04/04/2015 1040   LYMPHSABS 2.8 11/07/2013 1509   MONOABS 0.9 04/04/2015 1040   EOSABS 0.1 04/04/2015 1040   EOSABS 0.2 11/07/2013 1509   BASOSABS 0.1 04/04/2015 1040   BASOSABS 0.1 11/07/2013 1509    Hgb A1C Lab Results  Component Value Date   HGBA1C 6.3 05/05/2020            Assessment & Plan:   Hearing Loss secondary to Bilateral Cerumen Impaction:  Manual lavage by CMA Try Debrox 2 times weekly to prevent wax buildup  RTC in 6 months for your annual exam Webb Silversmith, NP This visit occurred during the SARS-CoV-2 public health emergency.  Safety protocols were in place, including screening questions prior to the visit, additional usage of staff PPE, and extensive cleaning of exam room while observing appropriate contact time as indicated for disinfecting solutions.

## 2020-12-24 LAB — LIPID PANEL
Cholesterol: 115 mg/dL (ref <200)
HDL: 37 mg/dL — ABNORMAL LOW (ref >=40)
LDL Cholesterol (Calc): 58 mg/dL (calc)
Non-HDL Cholesterol (Calc): 78 mg/dL (calc) (ref <130)
Total CHOL/HDL Ratio: 3.1 (calc) (ref <5.0)
Triglycerides: 114 mg/dL (ref <150)

## 2020-12-24 LAB — CBC
HCT: 53.1 % — ABNORMAL HIGH (ref 38.5–50.0)
Hemoglobin: 17.6 g/dL — ABNORMAL HIGH (ref 13.2–17.1)
MCH: 29.4 pg (ref 27.0–33.0)
MCHC: 33.1 g/dL (ref 32.0–36.0)
MCV: 88.6 fL (ref 80.0–100.0)
MPV: 10.2 fL (ref 7.5–12.5)
Platelets: 421 10*3/uL — ABNORMAL HIGH (ref 140–400)
RBC: 5.99 10*6/uL — ABNORMAL HIGH (ref 4.20–5.80)
RDW: 13.4 % (ref 11.0–15.0)
WBC: 13.1 10*3/uL — ABNORMAL HIGH (ref 3.8–10.8)

## 2020-12-24 LAB — COMPLETE METABOLIC PANEL WITH GFR
AG Ratio: 1.5 (calc) (ref 1.0–2.5)
ALT: 22 U/L (ref 9–46)
AST: 27 U/L (ref 10–35)
Albumin: 4.3 g/dL (ref 3.6–5.1)
Alkaline phosphatase (APISO): 85 U/L (ref 35–144)
BUN: 13 mg/dL (ref 7–25)
CO2: 24 mmol/L (ref 20–32)
Calcium: 9.8 mg/dL (ref 8.6–10.3)
Chloride: 105 mmol/L (ref 98–110)
Creat: 0.84 mg/dL (ref 0.70–1.35)
Globulin: 2.9 g/dL (calc) (ref 1.9–3.7)
Glucose, Bld: 96 mg/dL (ref 65–99)
Potassium: 4.5 mmol/L (ref 3.5–5.3)
Sodium: 141 mmol/L (ref 135–146)
Total Bilirubin: 0.6 mg/dL (ref 0.2–1.2)
Total Protein: 7.2 g/dL (ref 6.1–8.1)
eGFR: 100 mL/min/{1.73_m2} (ref >=60)

## 2020-12-24 LAB — HEMOGLOBIN A1C
Hgb A1c MFr Bld: 6 % of total Hgb — ABNORMAL HIGH (ref <5.7)
Mean Plasma Glucose: 126 mg/dL
eAG (mmol/L): 7 mmol/L

## 2020-12-24 LAB — MICROALBUMIN / CREATININE URINE RATIO
Creatinine, Urine: 259 mg/dL (ref 20–320)
Microalb Creat Ratio: 14 mcg/mg creat (ref <30)
Microalb, Ur: 3.7 mg/dL

## 2020-12-25 DIAGNOSIS — I700 Atherosclerosis of aorta: Secondary | ICD-10-CM | POA: Insufficient documentation

## 2020-12-25 MED ORDER — BLOOD GLUCOSE METER KIT: PACK | 0 refills | Status: DC

## 2020-12-25 NOTE — Assessment & Plan Note (Signed)
C-Met and lipid profile today Encouraged him to consume a low-fat diet Continue Rosuvastatin and Fenofibrate

## 2020-12-25 NOTE — Assessment & Plan Note (Signed)
Try to avoid foods that trigger your reflux Continue Omeprazole

## 2020-12-25 NOTE — Assessment & Plan Note (Signed)
Controlled on Metoprolol Reinforced DASH diet

## 2020-12-25 NOTE — Assessment & Plan Note (Signed)
C-Met and lipid profile today Continue Rosuvastatin, Fenofibrate, Metoprolol, Effient and Aspirin  He will continue to follow with cardiology

## 2020-12-25 NOTE — Assessment & Plan Note (Signed)
A1c and urine microalbumin today Encouraged him to consume a low-carb diet Advised him to schedule an appointment for his eye exam Encourage routine foot exam Flu, Pneumovax UTD Encouraged him to get his COVID booster

## 2020-12-25 NOTE — Assessment & Plan Note (Signed)
Continue Advair and Albuterol, refilled today Encourage smoking cessation

## 2020-12-25 NOTE — Assessment & Plan Note (Signed)
Compensated Continue Metoprolol

## 2020-12-25 NOTE — Patient Instructions (Signed)
Heart-Healthy Eating Plan Heart-healthy meal planning includes: Eating less unhealthy fats. Eating more healthy fats. Making other changes in your diet. Talk with your doctor or a diet specialist (dietitian) to create an eating plan that is right for you. What is my plan? Your doctor may recommend an eating plan that includes: Total fat: ______% or less of total calories a day. Saturated fat: ______% or less of total calories a day. Cholesterol: less than _________mg a day. What are tips for following this plan? Cooking Avoid frying your food. Try to bake, boil, grill, or broil it instead. You can also reduce fat by: Removing the skin from poultry. Removing all visible fats from meats. Steaming vegetables in water or broth. Meal planning  At meals, divide your plate into four equal parts: Fill one-half of your plate with vegetables and green salads. Fill one-fourth of your plate with whole grains. Fill one-fourth of your plate with lean protein foods. Eat 4-5 servings of vegetables per day. A serving of vegetables is: 1 cup of raw or cooked vegetables. 2 cups of raw leafy greens. Eat 4-5 servings of fruit per day. A serving of fruit is: 1 medium whole fruit.  cup of dried fruit.  cup of fresh, frozen, or canned fruit.  cup of 100% fruit juice. Eat more foods that have soluble fiber. These are apples, broccoli, carrots, beans, peas, and barley. Try to get 20-30 g of fiber per day. Eat 4-5 servings of nuts, legumes, and seeds per week: 1 serving of dried beans or legumes equals  cup after being cooked. 1 serving of nuts is  cup. 1 serving of seeds equals 1 tablespoon. General information Eat more home-cooked food. Eat less restaurant, buffet, and fast food. Limit or avoid alcohol. Limit foods that are high in starch and sugar. Avoid fried foods. Lose weight if you are overweight. Keep track of how much salt (sodium) you eat. This is important if you have high blood  pressure. Ask your doctor to tell you more about this. Try to add vegetarian meals each week. Fats Choose healthy fats. These include olive oil and canola oil, flaxseeds, walnuts, almonds, and seeds. Eat more omega-3 fats. These include salmon, mackerel, sardines, tuna, flaxseed oil, and ground flaxseeds. Try to eat fish at least 2 times each week. Check food labels. Avoid foods with trans fats or high amounts of saturated fat. Limit saturated fats. These are often found in animal products, such as meats, butter, and cream. These are also found in plant foods, such as palm oil, palm kernel oil, and coconut oil. Avoid foods with partially hydrogenated oils in them. These have trans fats. Examples are stick margarine, some tub margarines, cookies, crackers, and other baked goods. What foods can I eat? Fruits All fresh, canned (in natural juice), or frozen fruits. Vegetables Fresh or frozen vegetables (raw, steamed, roasted, or grilled). Green salads. Grains Most grains. Choose whole wheat and whole grains most of the time. Rice and pasta, including brown rice and pastas made with whole wheat. Meats and other proteins Lean, well-trimmed beef, veal, pork, and lamb. Chicken and turkey without skin. All fish and shellfish. Wild duck, rabbit, pheasant, and venison. Egg whites or low-cholesterol egg substitutes. Dried beans, peas, lentils, and tofu. Seeds and most nuts. Dairy Low-fat or nonfat cheeses, including ricotta and mozzarella. Skim or 1% milk that is liquid, powdered, or evaporated. Buttermilk that is made with low-fat milk. Nonfat or low-fat yogurt. Fats and oils Non-hydrogenated (trans-free) margarines. Vegetable oils, including   soybean, sesame, sunflower, olive, peanut, safflower, corn, canola, and cottonseed. Salad dressings or mayonnaise made with a vegetable oil. Beverages Mineral water. Coffee and tea. Diet carbonated beverages. Sweets and desserts Sherbet, gelatin, and fruit ice.  Small amounts of dark chocolate. Limit all sweets and desserts. Seasonings and condiments All seasonings and condiments. The items listed above may not be a complete list of foods and drinks you can eat. Contact a dietitian for more options. What foods should I avoid? Fruits Canned fruit in heavy syrup. Fruit in cream or butter sauce. Fried fruit. Limit coconut. Vegetables Vegetables cooked in cheese, cream, or butter sauce. Fried vegetables. Grains Breads that are made with saturated or trans fats, oils, or whole milk. Croissants. Sweet rolls. Donuts. High-fat crackers, such as cheese crackers. Meats and other proteins Fatty meats, such as hot dogs, ribs, sausage, bacon, rib-eye roast or steak. High-fat deli meats, such as salami and bologna. Caviar. Domestic duck and goose. Organ meats, such as liver. Dairy Cream, sour cream, cream cheese, and creamed cottage cheese. Whole-milk cheeses. Whole or 2% milk that is liquid, evaporated, or condensed. Whole buttermilk. Cream sauce or high-fat cheese sauce. Yogurt that is made from whole milk. Fats and oils Meat fat, or shortening. Cocoa butter, hydrogenated oils, palm oil, coconut oil, palm kernel oil. Solid fats and shortenings, including bacon fat, salt pork, lard, and butter. Nondairy cream substitutes. Salad dressings with cheese or sour cream. Beverages Regular sodas and juice drinks with added sugar. Sweets and desserts Frosting. Pudding. Cookies. Cakes. Pies. Milk chocolate or white chocolate. Buttered syrups. Full-fat ice cream or ice cream drinks. The items listed above may not be a complete list of foods and drinks to avoid. Contact a dietitian for more information. Summary Heart-healthy meal planning includes eating less unhealthy fats, eating more healthy fats, and making other changes in your diet. Eat a balanced diet. This includes fruits and vegetables, low-fat or nonfat dairy, lean protein, nuts and legumes, whole grains, and  heart-healthy oils and fats. This information is not intended to replace advice given to you by your health care provider. Make sure you discuss any questions you have with your health care provider. Document Revised: 06/05/2020 Document Reviewed: 06/05/2020 Elsevier Patient Education  2022 [employer]vier Inc.  

## 2020-12-25 NOTE — Assessment & Plan Note (Signed)
Not wearing CPAP Will monitor

## 2021-01-16 ENCOUNTER — Other Ambulatory Visit: Payer: Self-pay | Admitting: Internal Medicine

## 2021-01-16 NOTE — Telephone Encounter (Signed)
Requested medication (s) are due for refill today:   Requested medication (s) are on the active medication list: No  Last refill:    Future visit scheduled: No  Notes to clinic:  Not on medication list.    Requested Prescriptions  Pending Prescriptions Disp Refills   Lancets (ONETOUCH DELICA PLUS [id number]) [place] [Pharmacy Med Name: ONETOUCH DELICA PLUS 97D LANCT] 100 each     Sig: USE TO TEST BLOOD SUGAR UP TO [place]     Endocrinology: Diabetes - Testing Supplies Passed - 01/16/2021  6:21 AM      Passed - Valid encounter within last 12 months    Recent Outpatient Visits           3 weeks ago Type 2 diabetes mellitus with complication [facility])   [facility], Coralie Keens, NP       Future Appointments             In 3 months Ellyn Hack Leonie Green, MD [place] [facility], [facility]

## 2021-03-21 ENCOUNTER — Other Ambulatory Visit: Payer: Self-pay | Admitting: Internal Medicine

## 2021-03-22 ENCOUNTER — Other Ambulatory Visit: Payer: Self-pay | Admitting: Cardiology

## 2021-03-23 NOTE — Telephone Encounter (Signed)
Requested medication (s) are due for refill today:   Requested medication (s) are on the active medication list: No  Last refill:  Unknown  Future visit scheduled: Yes  Notes to clinic:  Not on medication list.    Requested Prescriptions  Pending Prescriptions Disp Refills   ONETOUCH ULTRA test strip [Pharmacy Med Name: ONETOUCH ULTRA TEST STRIP] 100 strip     Sig: USE TO TEST BLOOD SUGAR UP TO [place]     Endocrinology: Diabetes - Testing Supplies Passed - 03/21/2021 10:00 AM      Passed - Valid encounter within last 12 months    Recent Outpatient Visits           3 months ago Type 2 diabetes mellitus with complication [facility])   [facility], NP       Future Appointments             In 1 month Ellyn Hack Leonie Green, MD [place] [facility], [facility]

## 2021-04-04 ENCOUNTER — Other Ambulatory Visit: Payer: Self-pay | Admitting: Cardiology

## 2021-04-24 ENCOUNTER — Other Ambulatory Visit: Payer: Self-pay | Admitting: Internal Medicine

## 2021-04-24 NOTE — Telephone Encounter (Signed)
Requested Prescriptions  ?Pending Prescriptions Disp Refills  ?? Lancets (ONETOUCH DELICA PLUS [id number]) [place] [Pharmacy Med Name: Jonetta Speak PLUS 79Y LANCT] 100 each 3  ?  Sig: USE TO TEST BLOOD SUGAR UP TO 4 TIMES DAILY  ?  ? Endocrinology: Diabetes - Testing Supplies Passed - 04/24/2021  6:21 AM  ?  ?  Passed - Valid encounter within last 12 months  ?  Recent Outpatient Visits   ?      ? 4 months ago Type 2 diabetes mellitus with complication ([place])  ? [facility], Coralie Keens, NP  ?  ?  ?Future Appointments   ?        ? In 2 weeks Leonie Man, MD [place], [facility]  ?  ? ?  ?  ?  ? ?

## 2021-05-08 ENCOUNTER — Encounter: Payer: Self-pay | Admitting: Cardiology

## 2021-05-08 ENCOUNTER — Ambulatory Visit: Payer: PPO | Admitting: Cardiology

## 2021-05-08 VITALS — BP 118/70 | HR 78 | Ht 71.0 in | Wt 175.0 lb

## 2021-05-08 DIAGNOSIS — I2109 ST elevation (STEMI) myocardial infarction involving other coronary artery of anterior wall: Secondary | ICD-10-CM | POA: Diagnosis not present

## 2021-05-08 DIAGNOSIS — I5032 Chronic diastolic (congestive) heart failure: Secondary | ICD-10-CM

## 2021-05-08 DIAGNOSIS — I700 Atherosclerosis of aorta: Secondary | ICD-10-CM | POA: Diagnosis not present

## 2021-05-08 DIAGNOSIS — I25119 Atherosclerotic heart disease of native coronary artery with unspecified angina pectoris: Secondary | ICD-10-CM

## 2021-05-08 DIAGNOSIS — E785 Hyperlipidemia, unspecified: Secondary | ICD-10-CM

## 2021-05-08 DIAGNOSIS — E1169 Type 2 diabetes mellitus with other specified complication: Secondary | ICD-10-CM | POA: Diagnosis not present

## 2021-05-08 DIAGNOSIS — Z720 Tobacco use: Secondary | ICD-10-CM | POA: Diagnosis not present

## 2021-05-08 DIAGNOSIS — J439 Emphysema, unspecified: Secondary | ICD-10-CM | POA: Diagnosis not present

## 2021-05-08 DIAGNOSIS — I2111 ST elevation (STEMI) myocardial infarction involving right coronary artery: Secondary | ICD-10-CM

## 2021-05-08 DIAGNOSIS — I255 Ischemic cardiomyopathy: Secondary | ICD-10-CM | POA: Diagnosis not present

## 2021-05-08 DIAGNOSIS — I10 Essential (primary) hypertension: Secondary | ICD-10-CM | POA: Diagnosis not present

## 2021-05-08 DIAGNOSIS — E118 Type 2 diabetes mellitus with unspecified complications: Secondary | ICD-10-CM

## 2021-05-08 LAB — HEMOGLOBIN A1C
Est. average glucose Bld gHb Est-mCnc: 120 mg/dL
Hgb A1c MFr Bld: 5.8 % — ABNORMAL HIGH (ref 4.8–5.6)

## 2021-05-08 MED ORDER — NITROGLYCERIN 0.4 MG SL SUBL: 0.4000 mg | SUBLINGUAL_TABLET | SUBLINGUAL | 11 refills | Status: AC | PRN

## 2021-05-08 NOTE — Progress Notes (Addendum)
? ?Primary Care Provider: Jearld Fenton, NP ?Cardiologist: Glenetta Hew, MD ?Electrophysiologist: None ? ?Clinic Note: ?Chief Complaint  ?Patient presents with  ? Follow-up  ?  6 month - doing well  ? Coronary Artery Disease  ?  No Angina -- more active  ? ?=================================== ? ?/ASSESSMENT/PLAN  ? ?Problem List Items Addressed This Visit   ? ?  ? Cardiology Problems  ? STEMI, acute inf. wall with 100% RCA stenosis, DES 07/07/11 (prior Anterior STEMI in 2001) (Chronic)  ?  Actually had a STEMI involving all 3 major coronary arteries and has had PCI to all 3.  He is also having several non-STEMI is under urgent acute coronary syndrome/unstable angina episodes. ? ?He has been stable since his last PCI in 2018.  Cardiac cath in 2020 with patent stents. ? ?Mildly reduced EF.  No active angina or heart failure symptoms. ?  ?  ? Relevant Medications  ? nitroGLYCERIN (NITROSTAT) 0.4 MG SL tablet  ? Cardiomyopathy, ischemic; EF~45% with hypokinesis of the inferior and inferolateral myocardium; CO 3.25 (Chronic)  ?  Ischemic cardiomyopathy, EF 45-50% ?BP stable.  Is only on low-dose Toprol. ?Euvolemic without diuretic requirement. ? ?NYHA Class I CHF ?  ?  ? Relevant Medications  ? nitroGLYCERIN (NITROSTAT) 0.4 MG SL tablet  ? ST elevation myocardial infarction (STEMI) of anterior wall -- PCI to LAD (Chronic)  ? Relevant Medications  ? nitroGLYCERIN (NITROSTAT) 0.4 MG SL tablet  ? Coronary artery disease involving native coronary artery of native heart with angina pectoris (HCC) (Chronic)  ?  CAD ?Chronic stable multivessel CAD, no further anginal symptoms. He has had multiple stents as noted in his chart. No changes to medications. ?- continue low-dose metoprolol succinate 25 mg ?- continue Effient ?- continue rosuvastatin, fenofibrate ?- consider low-dose ACEi/ARB if needing additional BP control ? ?Doing well.  Active with no anginal symptoms. ?Tolerating low-dose Toprol, rosuvastatin plus  fenofibrate ?On long-term Effient (had respiratory issues on Brilinta and in-stent thrombosis on Plavix) ?Okay to hold Effient 7-9 days preop for surgeries or procedures. ?  ?  ? Relevant Medications  ? nitroGLYCERIN (NITROSTAT) 0.4 MG SL tablet  ? Chronic diastolic congestive heart failure, NYHA class 1 (HCC) (Chronic)  ? Relevant Medications  ? nitroGLYCERIN (NITROSTAT) 0.4 MG SL tablet  ? Essential hypertension (Chronic)  ?  Blood pressure looks much better today than it was last visit.  He is on low-dose Toprol. ?With current blood pressure, no need to consider ACE inhibitor/ARB. ?  ?  ? Relevant Medications  ? nitroGLYCERIN (NITROSTAT) 0.4 MG SL tablet  ? Hyperlipidemia associated with type 2 diabetes mellitus (HCC) (Chronic)  ?  HLD ?Well controlled on rosuvastatin and fenofibrate.  On fish oil 3 g daily. ? ?T2DM, diet controlled ?Most recent A1c 6.0%. Will check A1c today per patient request. ? ? ?Last labs from November showed LDL of 58. ?Continue current dose of the medicine. ?He is concerned about change in his diet over the last couple months and the fact that he is going on vacation again.  He is concerned that his A1c may have gone up and wants to make sure he is safe. ?  ?  ? Relevant Medications  ? nitroGLYCERIN (NITROSTAT) 0.4 MG SL tablet  ? Aortic atherosclerosis (HCC) (Chronic)  ?  On aggressive risk factor modification. ? ? ?Diabetes is controlled with diet, lipids well controlled on statin and fenofibrate plus fish oil.  Blood pressure well-controlled on Toprol. ?  ?  ?  Relevant Medications  ? nitroGLYCERIN (NITROSTAT) 0.4 MG SL tablet  ?  ? Other  ? Type 2 diabetes mellitus with complication (HCC) - Primary  ? Relevant Orders  ? HgB A1c (Completed)  ? COPD (chronic obstructive pulmonary disease) (HCC) (Chronic)  ?  Per PCP: On Advair with PRN albuterol.  Doing well.  No recent exacerbations. ? ?Continue encourage smoking cessation. ?  ?  ? Tobacco abuse (Chronic)  ?  Continue encourage smoking  cessation.  He is down now for 2 or 3 a day.  Getting close to be adequate again.  He has had several setbacks after successfully quitting.  I encouraged him to continue to push forward.  I think he can probably do it without any additional assistance. ? ?4 minutes . ?  ?  ? ?================================== ? ?HPI:   ? ?Troy Parrish. is a 61 y.o. male with a PMH notable for longstanding multivessel CAD-PCI (has had a STEMI of all 3 major arteries), ICM with EF roughly 45%-NYHA class I CHF, HLD, HTN who presents today for follow-up. ? ?Troy Parrish. was last seen on 11/04/20: This was preop for hemorrhoid surgery.  He was doing well.  He stated that he was feeling about as well as he had in the last 10 years.  No exertional dyspnea unless he would overdo it.  He was at peace with where he was in his life.  He enjoys being time with his wife and family.  Traveling to see all of his kids and grandkids.  Is very proud of his 2 sons that are currently in college.  No chest pain or pressure.  No arrhythmias.  Was getting pretty good exercise walking with his wife and also just doing odd jobs and chores around the house.  He had put back on a little bit of weight, and was starting to feel healthy after having lost too much. ?No med changes made. ? ?Recent Hospitalizations: none ? ?Reviewed  CV studies:   ? ?The following studies were reviewed today: (if available, images/films reviewed: From Epic Chart or Care Everywhere) ?No new studies ? ?Interval History:  ? ?Troy Parrish. Is doing well. He has cut back to smoking 3-4 cigarettes per day (which was the pre-set goal for this visit). He has some baseline dyspnea due to COPD. He had a brief episode of chest pain last month that lasted about 10 seconds which resolved spontaneously. He did not think it was anginal pain he has had in the past. Otherwise he is doing his normal activities without issue such as grocery shopping for 2 hours. He does not do any formal  exercise,  but does enjoy hiking and walking with his wife and kids/grandkids.  He travels a lot, and wherever they go okay walk around everywhere.. ? ?He is really happy about the decreased coughing and shortness of breath that he notes since cutting back cigarettes he smokes.  He says over the past couple days he is only had maybe 1 or 2 and nothing over the last 24 hours.  He is hoping that he is finally be able to make the final step to quit fully. ? ?CV Review of Symptoms (Summary) ?Cardiovascular ROS: no chest pain or dyspnea on exertion ?positive for -  only has exertional dyspnea if he overdoes it.  Otherwise rare palpitations. ?negative for - edema, orthopnea, paroxysmal nocturnal dyspnea, rapid heart rate, shortness of breath, or syncope/near syncope or TIA/amaurosis fugax, claudication;  melena, hematochezia, hematuria or epistaxis. ? ?REVIEWED OF SYSTEMS  ? ?Review of Systems  ?Constitutional:  Negative for malaise/fatigue (Energy level is doing great) and weight loss (Now finally maintaining stable weight.  He feels healthy at this point.).  ?HENT:  Negative for nosebleeds.   ?Respiratory:  Positive for cough (Still has morning smoker's cough, but getting better.). Negative for shortness of breath (Much better since cutting back his cigarettes.).   ?Cardiovascular:   ?     Per HPI  ?Gastrointestinal:  Negative for blood in stool and melena.  ?Genitourinary:  Negative for hematuria.  ?Musculoskeletal:  Positive for joint pain (Mild aches and pains).  ?Neurological:  Negative for dizziness, focal weakness and weakness.  ?Psychiatric/Behavioral:  Negative for depression and memory loss. The patient is not nervous/anxious and does not have insomnia.   ?     He is a very happy place.  Very positive mood.  Ready for his birthday  ? ?I have reviewed and (if needed) personally updated the patient's problem list, medications, allergies, past medical and surgical history, social and family history.  ? ?PAST  MEDICAL HISTORY  ? ?Past Medical History:  ?Diagnosis Date  ? Bronchitis   ? Gets bronchitis almost every year  ? CAD S/P percutaneous coronary angioplasty 06/2011; 6/ & 11/2013  ? S/P PCI to all 3 major vessels;a)  An

## 2021-05-08 NOTE — Patient Instructions (Signed)
Medication Instructions:  Continue same medications *If you need a refill on your cardiac medications before your next appointment, please call your pharmacy*   Lab Work: None ordered   Testing/Procedures: None ordered   Follow-Up: At [facility], you and your health needs are our priority.  As part of our continuing mission to provide you with exceptional heart care, we have created designated Provider Care Teams.  These Care Teams include your primary Cardiologist (physician) and Advanced Practice Providers (APPs -  Physician Assistants and Nurse Practitioners) who all work together to provide you with the care you need, when you need it.  We recommend signing up for the patient portal called "MyChart".  Sign up information is provided on this After Visit Summary.  MyChart is used to connect with patients for Virtual Visits (Telemedicine).  Patients are able to view lab/test results, encounter notes, upcoming appointments, etc.  Non-urgent messages can be sent to your provider as well.   To learn more about what you can do with MyChart, go to [email or web address].    Your next appointment:  6 months   The format for your next appointment: Office   Provider:  Dr.Harding   

## 2021-05-10 ENCOUNTER — Encounter: Payer: Self-pay | Admitting: Cardiology

## 2021-05-10 NOTE — Progress Notes (Signed)
? ? ?  ATTENDING ATTESTATION ? ?I have seen, examined and evaluated the patient along with the Resident Physician in clinic today.  I personally performed my own interview & exanimation.  After reviewing all the available data and chart, we discussed the patients laboratory, study & physical findings as well as symptoms in detail. I agree with his findings, examination as well as impression recommendations as per our discussion.   ? ?Attending adjustments int the full clinic noted annotated in [place].  ? ?STEMI, acute inf. wall with 100% RCA stenosis, DES 07/07/11 (prior Anterior STEMI in 2001) ?Actually had a STEMI involving all 3 major coronary arteries and has had PCI to all 3.  He is also having several non-STEMI is under urgent acute coronary syndrome/unstable angina episodes. ? ?He has been stable since his last PCI in 2018.  Cardiac cath in 2020 with patent stents. ? ?Mildly reduced EF.  No active angina or heart failure symptoms. ? ?Hyperlipidemia associated with type 2 diabetes mellitus ([place]) ?HLD ?Well controlled on rosuvastatin and fenofibrate.  On fish oil 3 g daily. ? ?T2DM, diet controlled ?Most recent A1c 6.0%. Will check A1c today per patient request. ? ? ?Last labs from November showed LDL of 58. ?Continue current dose of the medicine. ?He is concerned about change in his diet over the last couple months and the fact that he is going on vacation again.  He is concerned that his A1c may have gone up and wants to make sure he is safe. ? ?Essential hypertension ?Blood pressure looks much better today than it was last visit.  He is on low-dose Toprol. ?With current blood pressure, no need to consider ACE inhibitor/ARB. ? ?Coronary artery disease involving native coronary artery of native heart with angina pectoris ([place]) ?CAD ?Chronic stable multivessel CAD, no further anginal symptoms. He has had multiple stents as noted in his chart. No changes to medications. ?- continue low-dose metoprolol succinate  25 mg ?- continue Effient ?- continue rosuvastatin, fenofibrate ?- consider low-dose ACEi/ARB if needing additional BP control ? ?Doing well.  Active with no anginal symptoms. ?Tolerating low-dose Toprol, rosuvastatin plus fenofibrate ?On long-term Effient (had respiratory issues on Brilinta and in-stent thrombosis on Plavix) ?Okay to hold Effient 7-9 days preop for surgeries or procedures. ? ?Cardiomyopathy, ischemic; EF~45% with hypokinesis of the inferior and inferolateral myocardium; CO 3.25 ?Ischemic cardiomyopathy, EF 45-50% ?BP stable.  Is only on low-dose Toprol. ?Euvolemic without diuretic requirement. ? ?NYHA Class I CHF ? ?COPD (chronic obstructive pulmonary disease) ?Per PCP: On Advair with PRN albuterol.  Doing well.  No recent exacerbations. ? ?Continue encourage smoking cessation. ? ?Aortic atherosclerosis ([place]) ?On aggressive risk factor modification. ? ? ?Diabetes is controlled with diet, lipids well controlled on statin and fenofibrate plus fish oil.  Blood pressure well-controlled on Toprol. ? ?Tobacco abuse ?Continue encourage smoking cessation.  He is down now for 2 or 3 a day.  Getting close to be adequate again.  He has had several setbacks after successfully quitting.  I encouraged him to continue to push forward.  I think he can probably do it without any additional assistance. ? ?4 minutes . ? ? ? ?Glenetta Hew, M.D., M.S. ?Interventional Cardiologist  ? ?Pager # [phone] ?Phone # [phone] ?[address] ?[place],  [address] ? ?

## 2021-05-10 NOTE — Assessment & Plan Note (Signed)
Continue encourage smoking cessation.  He is down now for 2 or 3 a day.  Getting close to be adequate again.  He has had several setbacks after successfully quitting.  I encouraged him to continue to push forward.  I think he can probably do it without any additional assistance. ? ?4 minutes . ?

## 2021-05-10 NOTE — Assessment & Plan Note (Addendum)
Per PCP: On Advair with PRN albuterol.  Doing well.  No recent exacerbations. ? ?Continue encourage smoking cessation. ?

## 2021-05-10 NOTE — Assessment & Plan Note (Addendum)
Blood pressure looks much better today than it was last visit.  He is on low-dose Toprol. ?With current blood pressure, no need to consider ACE inhibitor/ARB. ?

## 2021-05-10 NOTE — Assessment & Plan Note (Signed)
CAD ?Chronic stable multivessel CAD, no further anginal symptoms. He has had multiple stents as noted in his chart. No changes to medications. ?- continue low-dose metoprolol succinate 25 mg ?- continue Effient ?- continue rosuvastatin, fenofibrate ?- consider low-dose ACEi/ARB if needing additional BP control ? ?Doing well.  Active with no anginal symptoms. ?? Tolerating low-dose Toprol, rosuvastatin plus fenofibrate ?? On long-term Effient (had respiratory issues on Brilinta and in-stent thrombosis on Plavix) ?? Okay to hold Effient 7-9 days preop for surgeries or procedures. ?

## 2021-05-10 NOTE — Assessment & Plan Note (Addendum)
HLD ?Well controlled on rosuvastatin and fenofibrate.  On fish oil 3 g daily. ? ?T2DM, diet controlled ?Most recent A1c 6.0%. Will check A1c today per patient request. ? ? ?Last labs from November showed LDL of 58. ?Continue current dose of the medicine. ?He is concerned about change in his diet over the last couple months and the fact that he is going on vacation again.  He is concerned that his A1c may have gone up and wants to make sure he is safe. ?

## 2021-05-10 NOTE — Assessment & Plan Note (Signed)
Actually had a STEMI involving all 3 major coronary arteries and has had PCI to all 3.  He is also having several non-STEMI is under urgent acute coronary syndrome/unstable angina episodes. ? ?He has been stable since his last PCI in 2018.  Cardiac cath in 2020 with patent stents. ? ?Mildly reduced EF.  No active angina or heart failure symptoms. ?

## 2021-05-10 NOTE — Assessment & Plan Note (Addendum)
Ischemic cardiomyopathy, EF 45-50% ?BP stable.  Is only on low-dose Toprol. ?Euvolemic without diuretic requirement. ? ?NYHA Class I CHF ?

## 2021-05-10 NOTE — Assessment & Plan Note (Addendum)
On aggressive risk factor modification. ? ? ?Diabetes is controlled with diet, lipids well controlled on statin and fenofibrate plus fish oil.  Blood pressure well-controlled on Toprol. ?

## 2021-05-18 ENCOUNTER — Other Ambulatory Visit: Payer: Self-pay | Admitting: Cardiology

## 2021-05-18 ENCOUNTER — Other Ambulatory Visit: Payer: Self-pay | Admitting: Internal Medicine

## 2021-05-18 NOTE — Telephone Encounter (Signed)
Copied from [place] [phone]. Topic: Quick Communication - Rx Refill/Question ?>> May 18, 2021  1:52 PM Yvette Rack wrote: ?Medication: clobetasol cream (TEMOVATE) 0.05 % ? ?Has the patient contacted their pharmacy? Yes.  Pt told to contact provider ?(Agent: If no, request that the patient contact the pharmacy for the refill. If patient does not wish to contact the pharmacy document the reason why and proceed with request.) ?(Agent: If yes, when and what did the pharmacy advise?) ? ?Preferred Pharmacy (with phone number or street name): [facility] [id number], [place]  ?Phone: [phone] ?Fax: [phone] ? ?Has the patient been seen for an appointment in the last year OR does the patient have an upcoming appointment? Yes.   ? ?Agent: Please be advised that RX refills may take up to 3 business days. We ask that you follow-up with your pharmacy. ?

## 2021-05-18 NOTE — Telephone Encounter (Signed)
Requested Prescriptions  ?Pending Prescriptions Disp Refills  ?? ONETOUCH ULTRA test strip [Pharmacy Med Name: ONETOUCH ULTRA TEST STRIP] 200 strip 1  ?  Sig: USE TO TEST BLOOD SUGAR UP TO FOUR TIMES DAILY  ?  ? Endocrinology: Diabetes - Testing Supplies Passed - 05/18/2021  6:21 AM  ?  ?  Passed - Valid encounter within last 12 months  ?  Recent Outpatient Visits   ?      ? 4 months ago Type 2 diabetes mellitus with complication ([place])  ? [facility], Coralie Keens, NP  ?  ?  ?Future Appointments   ?        ? In 5 months Leonie Man, MD [facility]  ?  ? ?  ?  ?  ? ? ?

## 2021-05-19 MED ORDER — CLOBETASOL PROPIONATE 0.05 % EX CREA: 1.0000 "application " | TOPICAL_CREAM | Freq: Every day | CUTANEOUS | 0 refills | Status: DC | PRN

## 2021-05-19 NOTE — Telephone Encounter (Signed)
Requested medication (s) are due for refill today: yes ? ?Requested medication (s) are on the active medication list: yes ? ?Last refill:   ?30 g 4 01/10/2018  ? ? ?Future visit scheduled: no ? ?Notes to clinic:  Unable to refill per protocol, cannot delegate.  ? ?  ?Requested Prescriptions  ?Pending Prescriptions Disp Refills  ? clobetasol cream (TEMOVATE) 0.05 % 30 g 4  ?  ? Not Delegated - Dermatology:  Corticosteroids Failed - 05/18/2021  3:01 PM  ?  ?  Failed - This refill cannot be delegated  ?  ?  Passed - Valid encounter within last 12 months  ?  Recent Outpatient Visits   ? ?      ? 4 months ago Type 2 diabetes mellitus with complication ([place])  ? [facility] [place], Coralie Keens, NP  ? ?  ?  ?Future Appointments   ? ?        ? In 5 months Leonie Man, MD [facility]  ? ?  ? ?  ?  ?  ? ?

## 2021-05-31 ENCOUNTER — Other Ambulatory Visit: Payer: Self-pay | Admitting: Cardiology

## 2021-07-03 ENCOUNTER — Other Ambulatory Visit: Payer: Self-pay | Admitting: Cardiology

## 2021-07-20 ENCOUNTER — Other Ambulatory Visit: Payer: Self-pay | Admitting: Cardiology

## 2021-07-20 ENCOUNTER — Other Ambulatory Visit: Payer: Self-pay | Admitting: Internal Medicine

## 2021-07-21 NOTE — Telephone Encounter (Signed)
Requested Prescriptions  Pending Prescriptions Disp Refills  . albuterol (VENTOLIN HFA) 108 (90 Base) MCG/ACT inhaler [Pharmacy Med Name: ALBUTEROL HFA 90 MCG INHALER] 25.5 g 0    Sig: INHALE TWO PUFFS BY MOUTH EVERY 6 HOURS AS NEEDED FOR SHORTNESS OF BREATH     Pulmonology:  Beta Agonists 2 Passed - 07/20/2021  5:31 PM      Passed - Last BP in normal range    BP Readings from Last 1 Encounters:  05/08/21 118/70         Passed - Last Heart Rate in normal range    Pulse Readings from Last 1 Encounters:  05/08/21 78         Passed - Valid encounter within last 12 months    Recent Outpatient Visits          7 months ago Type 2 diabetes mellitus with complication [facility])   [facility], Coralie Keens, NP      Future Appointments            In 3 months Ellyn Hack Leonie Green, MD [facility], [facility]

## 2021-08-04 ENCOUNTER — Telehealth: Payer: Self-pay | Admitting: Cardiology

## 2021-08-05 NOTE — Telephone Encounter (Signed)
[name]  - information given per Dr Ellyn Hack response. Patient verbalized understanding.

## 2021-08-05 NOTE — Telephone Encounter (Signed)
.   I do not think that he needs any special evaluation for going on a trip to [place].  1 thing the question would be his oxygen saturation and.  He may have some issues with this) take his time to acclimatize and not try to overdo things.  Things take longer, and need to hydrate   Glenetta Hew, MD

## 2021-08-13 ENCOUNTER — Encounter: Payer: Self-pay | Admitting: Internal Medicine

## 2021-08-13 ENCOUNTER — Ambulatory Visit (INDEPENDENT_AMBULATORY_CARE_PROVIDER_SITE_OTHER): Payer: PPO | Admitting: Internal Medicine

## 2021-08-13 VITALS — BP 108/72 | HR 98 | Temp 97.4°F | Ht 71.0 in | Wt 174.0 lb

## 2021-08-13 DIAGNOSIS — E118 Type 2 diabetes mellitus with unspecified complications: Secondary | ICD-10-CM

## 2021-08-13 DIAGNOSIS — H6123 Impacted cerumen, bilateral: Secondary | ICD-10-CM | POA: Diagnosis not present

## 2021-08-13 DIAGNOSIS — H259 Unspecified age-related cataract: Secondary | ICD-10-CM

## 2021-08-13 DIAGNOSIS — Z125 Encounter for screening for malignant neoplasm of prostate: Secondary | ICD-10-CM | POA: Diagnosis not present

## 2021-08-13 DIAGNOSIS — Z0001 Encounter for general adult medical examination with abnormal findings: Secondary | ICD-10-CM

## 2021-08-13 NOTE — Patient Instructions (Signed)
Health Maintenance, Male Adopting a healthy lifestyle and getting preventive care are important in promoting health and wellness. Ask your health care provider about: The right schedule for you to have regular tests and exams. Things you can do on your own to prevent diseases and keep yourself healthy. What should I know about diet, weight, and exercise? Eat a healthy diet  Eat a diet that includes plenty of vegetables, fruits, low-fat dairy products, and lean protein. Do not eat a lot of foods that are high in solid fats, added sugars, or sodium. Maintain a healthy weight Body mass index (BMI) is a measurement that can be used to identify possible weight problems. It estimates body fat based on height and weight. Your health care provider can help determine your BMI and help you achieve or maintain a healthy weight. Get regular exercise Get regular exercise. This is one of the most important things you can do for your health. Most adults should: Exercise for at least 150 minutes each week. The exercise should increase your heart rate and make you sweat (moderate-intensity exercise). Do strengthening exercises at least twice a week. This is in addition to the moderate-intensity exercise. Spend less time sitting. Even light physical activity can be beneficial. Watch cholesterol and blood lipids Have your blood tested for lipids and cholesterol at 61 years of age, then have this test every 5 years. You may need to have your cholesterol levels checked more often if: Your lipid or cholesterol levels are high. You are older than 61 years of age. You are at high risk for heart disease. What should I know about cancer screening? Many types of cancers can be detected early and may often be prevented. Depending on your health history and family history, you may need to have cancer screening at various ages. This may include screening for: Colorectal cancer. Prostate cancer. Skin cancer. Lung  cancer. What should I know about heart disease, diabetes, and high blood pressure? Blood pressure and heart disease High blood pressure causes heart disease and increases the risk of stroke. This is more likely to develop in people who have high blood pressure readings or are overweight. Talk with your health care provider about your target blood pressure readings. Have your blood pressure checked: Every 3-5 years if you are 18-39 years of age. Every year if you are 40 years old or older. If you are between the ages of 65 and 75 and are a current or former smoker, ask your health care provider if you should have a one-time screening for abdominal aortic aneurysm (AAA). Diabetes Have regular diabetes screenings. This checks your fasting blood sugar level. Have the screening done: Once every three years after age 45 if you are at a normal weight and have a low risk for diabetes. More often and at a younger age if you are overweight or have a high risk for diabetes. What should I know about preventing infection? Hepatitis B If you have a higher risk for hepatitis B, you should be screened for this virus. Talk with your health care provider to find out if you are at risk for hepatitis B infection. Hepatitis C Blood testing is recommended for: Everyone born from 1945 through 1965. Anyone with known risk factors for hepatitis C. Sexually transmitted infections (STIs) You should be screened each year for STIs, including gonorrhea and chlamydia, if: You are sexually active and are younger than 61 years of age. You are older than 61 years of age and your   health care provider tells you that you are at risk for this type of infection. Your sexual activity has changed since you were last screened, and you are at increased risk for chlamydia or gonorrhea. Ask your health care provider if you are at risk. Ask your health care provider about whether you are at high risk for HIV. Your health care provider  may recommend a prescription medicine to help prevent HIV infection. If you choose to take medicine to prevent HIV, you should first get tested for HIV. You should then be tested every 3 months for as long as you are taking the medicine. Follow these instructions at home: Alcohol use Do not drink alcohol if your health care provider tells you not to drink. If you drink alcohol: Limit how much you have to 0-2 drinks a day. Know how much alcohol is in your drink. In the [place] one drink equals one 12 oz bottle of beer (355 mL), one 5 oz glass of wine (148 mL), or one 1 oz glass of hard liquor (44 mL). Lifestyle Do not use any products that contain nicotine or tobacco. These products include cigarettes, chewing tobacco, and vaping devices, such as e-cigarettes. If you need help quitting, ask your health care provider. Do not use street drugs. Do not share needles. Ask your health care provider for help if you need support or information about quitting drugs. General instructions Schedule regular health, dental, and eye exams. Stay current with your vaccines. Tell your health care provider if: You often feel depressed. You have ever been abused or do not feel safe at home. Summary Adopting a healthy lifestyle and getting preventive care are important in promoting health and wellness. Follow your health care provider's instructions about healthy diet, exercising, and getting tested or screened for diseases. Follow your health care provider's instructions on monitoring your cholesterol and blood pressure. This information is not intended to replace advice given to you by your health care provider. Make sure you discuss any questions you have with your health care provider. Document Revised: 06/16/2020 Document Reviewed: 06/16/2020 Elsevier Patient Education  2023 Elsevier Inc.  

## 2021-08-13 NOTE — Progress Notes (Signed)
Subjective:    Patient ID: Troy Floro., male    DOB: January 12, 1961, 61 y.o.   MRN: [id number]  HPI  Patient presents to clinic today for his annual exam.  Flu: 11/2020 Tetanus: 02/2015 COVID: Levan Hurst x5 Shingrix: Never Pneumovax: 12/2018 Prevnar: 04/2020 PSA screening: 04/2020 Colon screening: 06/2020 Vision screening: as needed Dentist: as needed  Diet: He does eat meat. He consumes fruits and veggies. He does eat some fried foods. He drinks mostly water. Exercise: None  Review of Systems     Past Medical History:  Diagnosis Date   Bronchitis    Gets bronchitis almost every year   CAD S/P percutaneous coronary angioplasty 06/2011; 6/ & 11/2013   S/P PCI to all 3 major vessels;a)  Ant STEMI 2001- BMS-LAD x2 -->(redo PCI 6/'15 -- pLAD Xience DES 2.5 x 12- 2.75 mm, mLAD 2.25 x 12 - 2.7 mm), b) '06 UA --> Cx- OM DES; c) 2013 Inf MI BMS mRCA --> d) 10/'15 PCI dRCA Promus P DES 4.0 x 16 (4.25 mm); PTCA of RPL2 (2.0 mm) &RPDA (2.25 mm) 11/2013; 4/18 PCI OM1 Synergy DES  2.5 x 16   COPD (chronic obstructive pulmonary disease) (HCC)    Elevated WBC count    Emphysema of lung ([place])    Essential hypertension 07/07/2011   Former heavy tobacco smoker     quit in May 2015 after multiple attempts at trying to quit before    GERD (gastroesophageal reflux disease)    Glucose intolerance (pre-diabetes) June 2015    hemoglobin A1c 6.6   Headache    "Imdur related; stopped taking it; headaches went away" (11/15/2013)   Heart murmur    History of colon polyps    History of stomach ulcers    Hyperlipidemia with target LDL less than 70 10/30/1939   Metabolic syndrome    Pre-diabetes, hypertension and truncal obesity as well as dyslipidemia   OSA on CPAP    Pneumonia "several times"   Recurrent upper respiratory infection (URI)    Seizures ([place]) "several"   "last one was ~ 2011" (11/15/2013)   Shortness of breath dyspnea    ST elevation myocardial infarction (STEMI) of anterior wall ([place])  2001   History of -- 2 stents in early and distal mid LAD; prior cardiologist was Dr. Remi Haggard in [place] elevation myocardial infarction (STEMI) of inferior wall ([place]) 07/07/2011   Occluded RCA 2.75X18 INTEGRITY; Echo 07/2013: EF 45-50%, Inferior & Posterolateral HK.    Umbilical hernia    Unstable angina ([place]) 2006; 07/2013   Cx-OM - PCI 2.5 mm 13 mm Cypher DES [name], [place]) ; 07/2013: Severe ISR of both prox & Distal LAD stentS --> 2 DES stents (1 at prox edge of the proximal stent, 2nd covers the entire distal stent as well as proximal and distal edge stenose)    Upper respiratory infection     Current Outpatient Medications  Medication Sig Dispense Refill   albuterol (VENTOLIN HFA) 108 (90 Base) MCG/ACT inhaler INHALE TWO PUFFS BY MOUTH EVERY 6 HOURS AS NEEDED FOR SHORTNESS OF BREATH 25.5 g 0   blood glucose meter kit and supplies Dispense based on patient and insurance preference. Use up to four times daily as directed. (FOR ICD-10 E10.9, E11.9). 1 each 0   clobetasol cream (TEMOVATE) 7.40 % Apply 1 application. topically daily as needed (eczema). 30 g 0   fenofibrate (TRICOR) 48 MG tablet TAKE ONE TABLET BY MOUTH DAILY 90 tablet 3  fluticasone (FLONASE) 50 MCG/ACT nasal spray Place 2 sprays into both nostrils daily. 16 g 0   fluticasone-salmeterol (ADVAIR) 100-50 MCG/ACT AEPB INHALE ONE PUFF BY MOUTH TWICE A DAY - IN THE MORNING AND AT BEDTIME 180 each 1   ketoconazole (NIZORAL) 2 % shampoo Apply 1 application topically 2 (two) times a week.     Lancets (ONETOUCH DELICA PLUS [id number]) MISC USE TO TEST BLOOD SUGAR UP TO 4 TIMES DAILY 100 each 3   Melatonin 1 MG CAPS Take 1 mg by mouth at bedtime as needed (sleep).     metoprolol succinate (TOPROL-XL) 25 MG 24 hr tablet TAKE 1 AND 1/2 TABLET BY MOUTH EVERY EVENING 135 tablet 2   MODERNA COVID-19 BIVAL BOOSTER 50 MCG/0.5ML injection      nitroGLYCERIN (NITROSTAT) 0.4 MG SL tablet Place 1 tablet (0.4 mg total) under the  tongue every 5 (five) minutes as needed for chest pain. 25 tablet 11   Omega-3 Fatty Acids (FISH OIL) 1000 MG CPDR Take 3 g by mouth daily. 30 capsule 11   omeprazole (PRILOSEC) 40 MG capsule Take 1 capsule (40 mg total) by mouth daily. 90 capsule 3   ONETOUCH ULTRA test strip USE TO TEST BLOOD SUGAR UP TO FOUR TIMES DAILY 200 strip 1   prasugrel (EFFIENT) 10 MG TABS tablet TAKE ONE TABLET BY MOUTH EVERY EVENING 90 tablet 1   rosuvastatin (CRESTOR) 40 MG tablet TAKE ONE TABLET BY MOUTH DAILY 90 tablet 1   No current facility-administered medications for this visit.    Allergies  Allergen Reactions   Niacin And Related Hives and Itching    Family History  Problem Relation Age of Onset   Coronary artery disease Father    Heart disease Father    Stroke Father    Hypertension Father    Hyperlipidemia Mother    Diabetes Mother    Hyperlipidemia Maternal Grandmother    Diabetes Maternal Grandmother    Hyperlipidemia Maternal Grandfather    Diabetes Maternal Grandfather    Emphysema Maternal Grandfather    Heart disease Paternal Grandmother    Heart disease Paternal Grandfather    Cancer Maternal Aunt        breast    Social History   Socioeconomic History   Marital status: Married    Spouse name: Not on file   Number of children: Not on file   Years of education: Not on file   Highest education level: Not on file  Occupational History   Not on file  Tobacco Use   Smoking status: Every Day    Packs/day: 0.50    Years: 40.00    Total pack years: 20.00    Types: Cigarettes   Smokeless tobacco: Never   Tobacco comments:    Quit with Bupropion $RemoveBefore'150mg'OLnveONNiFHpX$  - > unfortunately, he restarted  Vaping Use   Vaping Use: Never used  Substance and Sexual Activity   Alcohol use: Yes    Comment: rare   Drug use: No   Sexual activity: Not on file  Other Topics Concern   Not on file  Social History Narrative   He is a married father of 57, grandfather of 75. He does not really get  routine exercise.    He is down to 5 or 6 cigarettes a day. He is very seriously wanting to quit. This is a significant cutback for him, but he has pretty much been told he needs to stop by his wife, and so he fully intends to  do so. He is willing to try the patches or whatever. He has a social alcoholic beverage every now and then.    Social Determinants of Health   Financial Resource Strain: Not on file  Food Insecurity: Not on file  Transportation Needs: Not on file  Physical Activity: Not on file  Stress: Not on file  Social Connections: Not on file  Intimate Partner Violence: Not on file     Constitutional: Denies fever, malaise, fatigue, headache or abrupt weight changes.  HEENT: Pt reports earwax buildup. Denies eye pain, eye redness, ear pain, ringing in the ears, runny nose, nasal congestion, bloody nose, or sore throat. Respiratory: Pt reports shortness of breath. Denies difficulty breathing, cough or sputum production.   Cardiovascular: Denies chest pain, chest tightness, palpitations or swelling in the hands or feet.  Gastrointestinal: Denies abdominal pain, bloating, constipation, diarrhea or blood in the stool.  GU: Denies urgency, frequency, pain with urination, burning sensation, blood in urine, odor or discharge. Musculoskeletal: Denies decrease in range of motion, difficulty with gait, muscle pain or joint pain and swelling.  Skin: Denies redness, rashes, lesions or ulcercations.  Neurological: Denies dizziness, difficulty with memory, difficulty with speech or problems with balance and coordination.  Psych: Denies anxiety, depression, SI/HI.  No other specific complaints in a complete review of systems (except as listed in HPI above).  Objective:   Physical Exam  BP 108/72 (BP Location: Left Arm, Patient Position: Sitting, Cuff Size: Normal)   Pulse 98   Temp (!) 97.4 F (36.3 C) (Tympanic)   Ht $R'5\' 11"'QP$  (1.803 m)   Wt 174 lb (78.9 kg)   SpO2 99%   BMI 24.27  kg/m   Wt Readings from Last 3 Encounters:  05/08/21 175 lb (79.4 kg)  12/23/20 174 lb 9.6 oz (79.2 kg)  12/19/20 174 lb (78.9 kg)    General: Appears his stated age, well developed, well nourished in NAD. Skin: Warm, dry and intact. No ulcerations noted. HEENT: Head: normal shape and size; Eyes: sclera white, no icterus, conjunctiva pink, PERRLA and EOMs intact; Ears: cerumen impaction bilaterally;  Neck:  Neck supple, trachea midline. No masses, lumps or thyromegaly present.  Cardiovascular: Normal rate and rhythm. S1,S2 noted.  No murmur, rubs or gallops noted. No JVD or BLE edema. No carotid bruits noted. Pulmonary/Chest: Normal effort and positive vesicular breath sounds. No respiratory distress. No wheezes, rales or ronchi noted.  Abdomen: Soft and nontender. Normal bowel sounds. No distention or masses noted.  Musculoskeletal: Strength 5/5 BUE/BLE. No difficulty with gait.  Neurological: Alert and oriented. Cranial nerves II-XII grossly intact. Coordination normal.  Psychiatric: Mood and affect normal. Behavior is normal. Judgment and thought content normal.     BMET    Component Value Date/Time   NA 141 12/23/2020 0959   NA 139 11/15/2019 1010   NA 139 10/24/2013 1050   K 4.5 12/23/2020 0959   K 3.8 10/24/2013 1050   CL 105 12/23/2020 0959   CL 107 10/24/2013 1050   CO2 24 12/23/2020 0959   CO2 24 10/24/2013 1050   GLUCOSE 96 12/23/2020 0959   GLUCOSE 108 (H) 10/24/2013 1050   BUN 13 12/23/2020 0959   BUN 16 11/15/2019 1010   BUN 8 10/24/2013 1050   CREATININE 0.84 12/23/2020 0959   CALCIUM 9.8 12/23/2020 0959   CALCIUM 8.2 (L) 10/24/2013 1050   GFRNONAA 70 11/15/2019 1010   GFRNONAA >60 10/24/2013 1050   GFRAA 81 11/15/2019 1010   GFRAA >60  10/24/2013 1050    Lipid Panel     Component Value Date/Time   CHOL 115 12/23/2020 0959   CHOL 141 11/15/2019 1010   TRIG 114 12/23/2020 0959   HDL 37 (L) 12/23/2020 0959   HDL 32 (L) 11/15/2019 1010   CHOLHDL 3.1  12/23/2020 0959   VLDL 18.2 05/05/2020 1304   LDLCALC 58 12/23/2020 0959    CBC    Component Value Date/Time   WBC 13.1 (H) 12/23/2020 0959   RBC 5.99 (H) 12/23/2020 0959   HGB 17.6 (H) 12/23/2020 0959   HGB 17.7 11/15/2019 1010   HCT 53.1 (H) 12/23/2020 0959   HCT 51.9 (H) 11/15/2019 1010   PLT 421 (H) 12/23/2020 0959   PLT 391 11/15/2019 1010   MCV 88.6 12/23/2020 0959   MCV 88 11/15/2019 1010   MCV 89 10/24/2013 1050   MCH 29.4 12/23/2020 0959   MCHC 33.1 12/23/2020 0959   RDW 13.4 12/23/2020 0959   RDW 13.0 11/15/2019 1010   RDW 14.5 10/24/2013 1050   LYMPHSABS 4.6 (H) 04/04/2015 1040   LYMPHSABS 2.8 11/07/2013 1509   MONOABS 0.9 04/04/2015 1040   EOSABS 0.1 04/04/2015 1040   EOSABS 0.2 11/07/2013 1509   BASOSABS 0.1 04/04/2015 1040   BASOSABS 0.1 11/07/2013 1509    Hgb A1C Lab Results  Component Value Date   HGBA1C 5.8 (H) 05/08/2021           Assessment & Plan:   Preventative Health Maintenance:  Encouraged him to get a flu shot in the fall Tetanus UTD COVID-vaccine UTD Pneumovax and Prevnar UTD Discussed Shingrix vaccine, he will check coverage with his insurance company and schedule a visit at the pharmacy if he would like to have this done Colon screening UTD Encouraged him to consume a balanced diet and exercise regimen Advised him to see an eye doctor and dentist annually We will check CBC, c-Met, lipid, A1c, PSA today  Cerumen Impaction:  Manual lavage by CMA Advised him to try Debrox 2 x week to prevent wax buildup  RTC in 6 months, follow-up chronic conditions Webb Silversmith, NP

## 2021-08-13 NOTE — Addendum Note (Signed)
Addended by: Jearld Fenton on: 08/13/2021 03:36 PM   Modules accepted: Orders

## 2021-08-14 ENCOUNTER — Other Ambulatory Visit: Payer: PPO

## 2021-08-14 DIAGNOSIS — Z125 Encounter for screening for malignant neoplasm of prostate: Secondary | ICD-10-CM

## 2021-08-14 DIAGNOSIS — E118 Type 2 diabetes mellitus with unspecified complications: Secondary | ICD-10-CM | POA: Diagnosis not present

## 2021-08-14 DIAGNOSIS — Z0001 Encounter for general adult medical examination with abnormal findings: Secondary | ICD-10-CM

## 2021-08-15 LAB — CBC
HCT: 50.5 % — ABNORMAL HIGH (ref 38.5–50.0)
Hemoglobin: 16.8 g/dL (ref 13.2–17.1)
MCH: 30.1 pg (ref 27.0–33.0)
MCHC: 33.3 g/dL (ref 32.0–36.0)
MCV: 90.5 fL (ref 80.0–100.0)
MPV: 9.7 fL (ref 7.5–12.5)
Platelets: 370 10*3/uL (ref 140–400)
RBC: 5.58 10*6/uL (ref 4.20–5.80)
RDW: 14.1 % (ref 11.0–15.0)
WBC: 11.6 10*3/uL — ABNORMAL HIGH (ref 3.8–10.8)

## 2021-08-15 LAB — HEMOGLOBIN A1C
Hgb A1c MFr Bld: 5.8 % of total Hgb — ABNORMAL HIGH (ref <5.7)
Mean Plasma Glucose: 120 mg/dL
eAG (mmol/L): 6.6 mmol/L

## 2021-08-15 LAB — LIPID PANEL
Cholesterol: 114 mg/dL (ref <200)
HDL: 37 mg/dL — ABNORMAL LOW (ref >=40)
LDL Cholesterol (Calc): 53 mg/dL (calc)
Non-HDL Cholesterol (Calc): 77 mg/dL (calc) (ref <130)
Total CHOL/HDL Ratio: 3.1 (calc) (ref <5.0)
Triglycerides: 154 mg/dL — ABNORMAL HIGH (ref <150)

## 2021-08-15 LAB — COMPLETE METABOLIC PANEL WITH GFR
AG Ratio: 1.5 (calc) (ref 1.0–2.5)
ALT: 21 U/L (ref 9–46)
AST: 20 U/L (ref 10–35)
Albumin: 4 g/dL (ref 3.6–5.1)
Alkaline phosphatase (APISO): 85 U/L (ref 35–144)
BUN: 14 mg/dL (ref 7–25)
CO2: 20 mmol/L (ref 20–32)
Calcium: 9 mg/dL (ref 8.6–10.3)
Chloride: 110 mmol/L (ref 98–110)
Creat: 0.86 mg/dL (ref 0.70–1.35)
Globulin: 2.6 g/dL (calc) (ref 1.9–3.7)
Glucose, Bld: 125 mg/dL — ABNORMAL HIGH (ref 65–99)
Potassium: 4.2 mmol/L (ref 3.5–5.3)
Sodium: 139 mmol/L (ref 135–146)
Total Bilirubin: 0.4 mg/dL (ref 0.2–1.2)
Total Protein: 6.6 g/dL (ref 6.1–8.1)
eGFR: 99 mL/min/{1.73_m2} (ref >=60)

## 2021-08-15 LAB — PSA: PSA: 0.36 ng/mL (ref <=4.00)

## 2021-08-19 ENCOUNTER — Other Ambulatory Visit: Payer: Self-pay | Admitting: Cardiology

## 2021-08-20 ENCOUNTER — Other Ambulatory Visit: Payer: Self-pay | Admitting: Cardiology

## 2021-08-20 ENCOUNTER — Other Ambulatory Visit: Payer: Self-pay | Admitting: Internal Medicine

## 2021-08-20 MED ORDER — ALBUTEROL SULFATE HFA 108 (90 BASE) MCG/ACT IN AERS: INHALATION_SPRAY | RESPIRATORY_TRACT | 0 refills | Status: DC

## 2021-08-20 MED ORDER — KETOCONAZOLE 2 % EX SHAM: 1.0000 | MEDICATED_SHAMPOO | CUTANEOUS | 0 refills | Status: DC

## 2021-08-20 MED ORDER — CLOBETASOL PROPIONATE 0.05 % EX CREA
1.0000 | TOPICAL_CREAM | Freq: Every day | CUTANEOUS | 0 refills | Status: DC | PRN
Start: 2021-08-20 — End: 2022-01-26

## 2021-08-20 NOTE — Telephone Encounter (Signed)
Medication Refill - Medication: omeprazole (PRILOSEC) 40 MG capsule       ketoconazole (NIZORAL) 2 % shampoo      clobetasol cream (TEMOVATE) 0.05 %      albuterol (VENTOLIN HFA) 108 (90 Base) MCG/ACT inhaler   Pt stated he really needs the omeprazole before he goes out of town for the weekend   Has the patient contacted their pharmacy? Yes.   (Agent: If no, request that the patient contact the pharmacy for the refill. If patient does not wish to contact the pharmacy document the reason why and proceed with request.) (Agent: If yes, when and what did the pharmacy advise?) he stated they stated they sent a request   Preferred Pharmacy (with phone number or street name): Preferred pharmacy: [facility] [id number] - [address], [place] Has the patient been seen for an appointment in the last year OR does the patient have an upcoming appointment? Yes.    Agent: Please be advised that RX refills may take up to 3 business days. We ask that you follow-up with your pharmacy.

## 2021-08-20 NOTE — Telephone Encounter (Signed)
Requested Prescriptions  Pending Prescriptions Disp Refills  . ketoconazole (NIZORAL) 2 % shampoo 120 mL     Sig: Apply 1 Application topically 2 (two) times a week.     Not Delegated - Over the Counter: OTC 2 Failed - 08/20/2021  8:14 AM      Failed - This refill cannot be delegated      Passed - Valid encounter within last 12 months    Recent Outpatient Visits          1 week ago Encounter for general adult medical examination with abnormal findings   [facility] [place], Coralie Keens, NP   8 months ago Type 2 diabetes mellitus with complication [facility])   [facility] Jearld Fenton, NP      Future Appointments            In 2 months Leonie Man, MD [place], [facility]           . clobetasol cream (TEMOVATE) 0.05 % 30 g 0    Sig: Apply 1 Application topically daily as needed (eczema).     Not Delegated - Dermatology:  Corticosteroids Failed - 08/20/2021  8:14 AM      Failed - This refill cannot be delegated      Passed - Valid encounter within last 12 months    Recent Outpatient Visits          1 week ago Encounter for general adult medical examination with abnormal findings   [facility] [place], Coralie Keens, NP   8 months ago Type 2 diabetes mellitus with complication [facility])   [facility] Jearld Fenton, NP      Future Appointments            In 2 months Leonie Man, MD [place] [facility], [facility]           . albuterol (VENTOLIN HFA) 108 (90 Base) MCG/ACT inhaler 25.5 g 0    Sig: INHALE TWO PUFFS BY MOUTH EVERY 6 HOURS AS NEEDED FOR SHORTNESS OF BREATH     Pulmonology:  Beta Agonists 2 Passed - 08/20/2021  8:14 AM      Passed - Last BP in normal range    BP Readings from Last 1 Encounters:  08/13/21 108/72         Passed - Last Heart Rate in normal range    Pulse Readings from Last 1 Encounters:  08/13/21 98         Passed - Valid encounter within last 12 months    Recent  Outpatient Visits          1 week ago Encounter for general adult medical examination with abnormal findings   [facility] [place], Coralie Keens, NP   8 months ago Type 2 diabetes mellitus with complication [facility])   [facility], Coralie Keens, NP      Future Appointments            In 2 months Leonie Man, MD [place], [facility]

## 2021-08-20 NOTE — Telephone Encounter (Signed)
Requested medication (s) are due for refill today: Yes  Requested medication (s) are on the active medication list: Yes  Last refill:    Future visit scheduled: No  Notes to clinic:  Nizoral - historical provider.    Requested Prescriptions  Pending Prescriptions Disp Refills   ketoconazole (NIZORAL) 2 % shampoo 120 mL     Sig: Apply 1 Application topically 2 (two) times a week.     Not Delegated - Over the Counter: OTC 2 Failed - 08/20/2021  9:02 AM      Failed - This refill cannot be delegated      Passed - Valid encounter within last 12 months    Recent Outpatient Visits           1 week ago Encounter for general adult medical examination with abnormal findings   [facility] [place], Coralie Keens, NP   8 months ago Type 2 diabetes mellitus with complication [facility])   [facility], NP       Future Appointments             In 2 months Leonie Man, MD [facility], [facility]             clobetasol cream (TEMOVATE) 0.05 % 30 g 0    Sig: Apply 1 Application topically daily as needed (eczema).     Not Delegated - Dermatology:  Corticosteroids Failed - 08/20/2021  9:02 AM      Failed - This refill cannot be delegated      Passed - Valid encounter within last 12 months    Recent Outpatient Visits           1 week ago Encounter for general adult medical examination with abnormal findings   [facility] [place], Coralie Keens, NP   8 months ago Type 2 diabetes mellitus with complication [facility])   [place], Coralie Keens, NP       Future Appointments             In 2 months Leonie Man, MD [facility], [facility]            Signed Prescriptions Disp Refills   albuterol (VENTOLIN HFA) 108 (90 Base) MCG/ACT inhaler 25.5 g 0    Sig: INHALE TWO PUFFS BY MOUTH EVERY 6 HOURS AS NEEDED FOR SHORTNESS OF BREATH     Pulmonology:  Beta Agonists 2 Passed - 08/20/2021  8:14  AM      Passed - Last BP in normal range    BP Readings from Last 1 Encounters:  08/13/21 108/72         Passed - Last Heart Rate in normal range    Pulse Readings from Last 1 Encounters:  08/13/21 98         Passed - Valid encounter within last 12 months    Recent Outpatient Visits           1 week ago Encounter for general adult medical examination with abnormal findings   [facility] [place], Coralie Keens, NP   8 months ago Type 2 diabetes mellitus with complication [facility])   [facility], Coralie Keens, NP       Future Appointments             In 2 months Leonie Man, MD [place], [facility]

## 2021-08-20 NOTE — Telephone Encounter (Signed)
Requested medication (s) are due for refill today: yes  Requested medication (s) are on the active medication list: yes  Last refill:  07/02/20 for ketoconazole shampoo, 05/19/21 for cream  Future visit scheduled: no  Notes to clinic:  Unable to refill per protocol, cannot delegate. Last refill by another provider.      Requested Prescriptions  Pending Prescriptions Disp Refills   ketoconazole (NIZORAL) 2 % shampoo 120 mL     Sig: Apply 1 Application topically 2 (two) times a week.     Not Delegated - Over the Counter: OTC 2 Failed - 08/20/2021  8:14 AM      Failed - This refill cannot be delegated      Passed - Valid encounter within last 12 months    Recent Outpatient Visits           1 week ago Encounter for general adult medical examination with abnormal findings   [facility], Coralie Keens, NP   8 months ago Type 2 diabetes mellitus with complication [facility])   [facility], NP       Future Appointments             In 2 months Leonie Man, MD [facility]             clobetasol cream (TEMOVATE) 0.05 % 30 g 0    Sig: Apply 1 Application topically daily as needed (eczema).     Not Delegated - Dermatology:  Corticosteroids Failed - 08/20/2021  8:14 AM      Failed - This refill cannot be delegated      Passed - Valid encounter within last 12 months    Recent Outpatient Visits           1 week ago Encounter for general adult medical examination with abnormal findings   [facility], Coralie Keens, NP   8 months ago Type 2 diabetes mellitus with complication [facility])   [facility], Coralie Keens, NP       Future Appointments             In 2 months Leonie Man, MD [facility]            Signed Prescriptions Disp Refills   albuterol (VENTOLIN HFA) 108 (90 Base) MCG/ACT inhaler 25.5 g 0    Sig: INHALE TWO PUFFS BY MOUTH EVERY 6  HOURS AS NEEDED FOR SHORTNESS OF BREATH     Pulmonology:  Beta Agonists 2 Passed - 08/20/2021  8:14 AM      Passed - Last BP in normal range    BP Readings from Last 1 Encounters:  08/13/21 108/72         Passed - Last Heart Rate in normal range    Pulse Readings from Last 1 Encounters:  08/13/21 98         Passed - Valid encounter within last 12 months    Recent Outpatient Visits           1 week ago Encounter for general adult medical examination with abnormal findings   [facility] [place], Coralie Keens, NP   8 months ago Type 2 diabetes mellitus with complication [facility])   [facility], Coralie Keens, NP       Future Appointments             In 2 months Ellyn Hack,  Leonie Green, MD [place] [facility]

## 2021-08-20 NOTE — Telephone Encounter (Signed)
Also needs omeprazole (PRILOSEC) 40 MG capsule today

## 2021-09-25 DIAGNOSIS — H25043 Posterior subcapsular polar age-related cataract, bilateral: Secondary | ICD-10-CM | POA: Diagnosis not present

## 2021-09-25 DIAGNOSIS — H25013 Cortical age-related cataract, bilateral: Secondary | ICD-10-CM | POA: Diagnosis not present

## 2021-09-25 DIAGNOSIS — H2513 Age-related nuclear cataract, bilateral: Secondary | ICD-10-CM | POA: Diagnosis not present

## 2021-09-25 DIAGNOSIS — E119 Type 2 diabetes mellitus without complications: Secondary | ICD-10-CM | POA: Diagnosis not present

## 2021-09-25 LAB — HM DIABETES EYE EXAM

## 2021-10-09 ENCOUNTER — Ambulatory Visit (INDEPENDENT_AMBULATORY_CARE_PROVIDER_SITE_OTHER): Payer: PPO

## 2021-10-09 VITALS — Ht 71.0 in | Wt 174.0 lb

## 2021-10-09 DIAGNOSIS — Z0000 Encounter for general adult medical examination without abnormal findings: Secondary | ICD-10-CM

## 2021-10-09 NOTE — Progress Notes (Signed)
Virtual Visit via Telephone Note  I connected with  Troy Parrish. on 10/09/21 at 10:15 AM EDT by telephone and verified that I am speaking with the correct person using two identifiers.  Location: Patient: home Provider: [facility] Persons participating in the virtual visit: [place]   I discussed the limitations, risks, security and privacy concerns of performing an evaluation and management service by telephone and the availability of in person appointments. The patient expressed understanding and agreed to proceed.  Interactive audio and video telecommunications were attempted between this nurse and patient, however failed, due to patient having technical difficulties OR patient did not have access to video capability.  We continued and completed visit with audio only.  Some vital signs may be absent or patient reported.   Dionisio David, LPN  Subjective:   Troy Parrish. is a 61 y.o. male who presents for Medicare Annual/Subsequent preventive examination.  Review of Systems     Cardiac Risk Factors include: advanced age (>39mn, >>43women);male gender;dyslipidemia;hypertension     Objective:    There were no vitals filed for this visit. There is no height or weight on file to calculate BMI.     10/09/2021   10:22 AM 09/11/2020    7:56 AM 01/23/2018    9:14 PM 08/29/2015    3:39 AM 04/18/2015    9:23 AM 04/04/2015    9:32 AM 03/28/2015    8:26 AM  Advanced Directives  Does Patient Have a Medical Advance Directive? No Yes No Yes Yes No Yes  Type of ACorporate treasurerof AThroopLiving will  Living will HFairplay   Does patient want to make changes to medical advance directive?  No - Guardian declined       Copy of [place] Chart?  No - copy requested  No - copy requested     Would patient like information on creating a medical advance directive? No - Patient declined     Yes - Educational  materials given     Current Medications (verified) Outpatient Encounter Medications as of 10/09/2021  Medication Sig   albuterol (VENTOLIN HFA) 108 (90 Base) MCG/ACT inhaler INHALE TWO PUFFS BY MOUTH EVERY 6 HOURS AS NEEDED FOR SHORTNESS OF BREATH   blood glucose meter kit and supplies Dispense based on patient and insurance preference. Use up to four times daily as directed. (FOR ICD-10 E10.9, E11.9).   clobetasol cream (TEMOVATE) 02.99% Apply 1 Application topically daily as needed (eczema).   fenofibrate (TRICOR) 48 MG tablet TAKE ONE TABLET BY MOUTH DAILY   fluticasone-salmeterol (ADVAIR) 100-50 MCG/ACT AEPB INHALE ONE PUFF BY MOUTH TWICE A DAY - IN THE MORNING AND AT BEDTIME   ketoconazole (NIZORAL) 2 % shampoo Apply 1 Application topically 2 (two) times a week.   Lancets (ONETOUCH DELICA PLUS [id number] MISC USE TO TEST BLOOD SUGAR UP TO 4 TIMES DAILY   Melatonin 1 MG CAPS Take 1 mg by mouth at bedtime as needed (sleep).   metoprolol succinate (TOPROL-XL) 25 MG 24 hr tablet TAKE 1 AND 1/2 TABLET BY MOUTH EVERY EVENING   MODERNA COVID-19 BIVAL BOOSTER 50 MCG/0.5ML injection    nitroGLYCERIN (NITROSTAT) 0.4 MG SL tablet Place 1 tablet (0.4 mg total) under the tongue every 5 (five) minutes as needed for chest pain.   Omega-3 Fatty Acids (FISH OIL) 1000 MG CPDR Take 3 g by mouth daily.   omeprazole (PRILOSEC) 20 MG  capsule TAKE ONE CAPSULE BY MOUTH DAILY   omeprazole (PRILOSEC) 40 MG capsule Take 1 capsule (40 mg total) by mouth daily.   ONETOUCH ULTRA test strip USE TO TEST BLOOD SUGAR UP TO FOUR TIMES DAILY   prasugrel (EFFIENT) 10 MG TABS tablet TAKE ONE TABLET BY MOUTH EVERY EVENING   rosuvastatin (CRESTOR) 40 MG tablet TAKE ONE TABLET BY MOUTH DAILY   fluticasone (FLONASE) 50 MCG/ACT nasal spray Place 2 sprays into both nostrils daily.   No facility-administered encounter medications on file as of 10/09/2021.    Allergies (verified) Niacin and related   History: Past Medical  History:  Diagnosis Date   Bronchitis    Gets bronchitis almost every year   CAD S/P percutaneous coronary angioplasty 06/2011; 6/ & 11/2013   S/P PCI to all 3 major vessels;a)  Ant STEMI 2001- BMS-LAD x2 -->(redo PCI 6/'15 -- pLAD Xience DES 2.5 x 12- 2.75 mm, mLAD 2.25 x 12 - 2.7 mm), b) '06 UA --> Cx- OM DES; c) 2013 Inf MI BMS mRCA --> d) 10/'15 PCI dRCA Promus P DES 4.0 x 16 (4.25 mm); PTCA of RPL2 (2.0 mm) &RPDA (2.25 mm) 11/2013; 4/18 PCI OM1 Synergy DES  2.5 x 16   COPD (chronic obstructive pulmonary disease) (HCC)    Elevated WBC count    Emphysema of lung ([place])    Essential hypertension 07/07/2011   Former heavy tobacco smoker     quit in May 2015 after multiple attempts at trying to quit before    GERD (gastroesophageal reflux disease)    Glucose intolerance (pre-diabetes) June 2015    hemoglobin A1c 6.6   Headache    "Imdur related; stopped taking it; headaches went away" (11/15/2013)   Heart murmur    History of colon polyps    History of stomach ulcers    Hyperlipidemia with target LDL less than 70 2/40/9735   Metabolic syndrome    Pre-diabetes, hypertension and truncal obesity as well as dyslipidemia   OSA on CPAP    Pneumonia "several times"   Recurrent upper respiratory infection (URI)    Seizures ([place]) "several"   "last one was ~ 2011" (11/15/2013)   Shortness of breath dyspnea    ST elevation myocardial infarction (STEMI) of anterior wall ([place]) 2001   History of -- 2 stents in early and distal mid LAD; prior cardiologist was Dr. Remi Haggard in [place] elevation myocardial infarction (STEMI) of inferior wall ([place]) 07/07/2011   Occluded RCA 2.75X18 INTEGRITY; Echo 07/2013: EF 45-50%, Inferior & Posterolateral HK.    Umbilical hernia    Unstable angina ([place]) 2006; 07/2013   Cx-OM - PCI 2.5 mm 13 mm Cypher DES [name], [place]) ; 07/2013: Severe ISR of both prox & Distal LAD stentS --> 2 DES stents (1 at prox edge of the proximal stent, 2nd covers the entire  distal stent as well as proximal and distal edge stenose)    Upper respiratory infection    Past Surgical History:  Procedure Laterality Date   BIOPSY N/A 06/30/2020   Procedure: BIOPSY;  Surgeon: Lucilla Lame, MD;  Location: [place];  Service: Endoscopy;  Laterality: N/A;   Cardiac MRI [facility]  09/2014   EF 51%. Mod HK of basal-mid Inf wall, mild HK of basal inferoseptum & basal-mid inferolateral wall with hyper enhancement (c/w subendocard RCA MI with significant viability), focal hyper enhancement of apical wall c/w dLAD subendocard MI & complete LAD viability. . No aortic stenosis. Normal RV  function. No Ischemia on Adenosine Stress.   COLONOSCOPY WITH PROPOFOL N/A 03/28/2015   Procedure: COLONOSCOPY WITH PROPOFOL;  Surgeon: Lucilla Lame, MD;  Location: [place];  Service: Endoscopy;  Laterality: N/A;   COLONOSCOPY WITH PROPOFOL N/A 06/30/2020   Procedure: COLONOSCOPY WITH PROPOFOL;  Surgeon: Lucilla Lame, MD;  Location: [place];  Service: Endoscopy;  Laterality: N/A;   CORONARY ANGIOPLASTY WITH STENT PLACEMENT  2001   ANTERIOR mi with a  PCI to LAD; [place], [place] - Dr. Dorris Fetch   CORONARY ANGIOPLASTY WITH STENT PLACEMENT  2006   [place] by Dr Dorris Fetch -lesion lft circ/OM1 with 2.5 x 94m Cypher DES   CORONARY STENT INTERVENTION N/A 05/19/2016   Procedure: Coronary Stent Intervention;  Surgeon: DLeonie Man MD;  [facility];  Service: Cardiovascular: PCI pOM1 - Synergy DES 2.5 x 16 (overlaps old stent)   ESOPHAGOGASTRODUODENOSCOPY (EGD) WITH PROPOFOL N/A 03/28/2015   Procedure: ESOPHAGOGASTRODUODENOSCOPY (EGD) WITH PROPOFOL;  Surgeon: DLucilla Lame MD;  [place]  Service: Endoscopy;  Laterality: N/A;   ESOPHAGOGASTRODUODENOSCOPY (EGD) WITH PROPOFOL N/A 06/30/2020   Procedure: ESOPHAGOGASTRODUODENOSCOPY (EGD) WITH PROPOFOL;  Surgeon: WLucilla Lame MD;  [place]  Service: Endoscopy;  Laterality: N/A;    ESOPHAGOGASTRODUODENOSCOPY (EGD) WITH PROPOFOL N/A 09/11/2020   Procedure: ESOPHAGOGASTRODUODENOSCOPY (EGD) WITH PROPOFOL;  Surgeon: WLucilla Lame MD;  [place]  Service: Endoscopy;  Laterality: N/A;   INSERTION OF MESH N/A 04/02/2015   Procedure: INSERTION OF MESH;  Surgeon: RFlorene Glen MD;  Location: [facility];  Service: General;  Laterality: N/A;   LEFT AND RIGHT HEART CATHETERIZATION WITH CORONARY ANGIOGRAM N/A 07/30/2013   Procedure: LEFT AND RIGHT HEART CATHETERIZATION WITH CORONARY ANGIOGRAM;  Surgeon: DLeonie Man MD;  [facility];  Service: Cardiovascular;  2 separate P & mLAD lesions -> PCI, MOd-Severe dRCA disesase with diffuse RPL/PDA disease (FFR)   LEFT HEART CATH  08/03/2013   Procedure: LEFT HEART CATH;  Surgeon: DLeonie Man MD;  [facility];  Service: Cardiovascular;;FFR of RCA - non-flow-limiting   LEFT HEART CATH AND CORONARY ANGIOGRAPHY N/A 05/19/2016   Procedure: Left Heart Cath and Coronary Angiography;  Surgeon: DLeonie Man MD;  [place];  Service: Cardiovascular: CULPRIT - 85% pOM1 pre-stent. Stents in both proximal and distal RCA widely patent. PTCA sites in RPL & PDA widely patent with stable 70% small RPL 2. Extensive stenting in the LAD widely patent stents with mild to mod Dz btw prox & mid-distal stents.    LEFT HEART CATH AND CORONARY ANGIOGRAPHY N/A 02/13/2018   Procedure: LEFT HEART CATH AND CORONARY ANGIOGRAPHY;  Surgeon: HLeonie Man MD;  [place] [facility];  Service: Cardiovascular: Similar findings to last cath post PCI.  Stents are patent.  Roughly 50% proximal LAD lesion noted, may be progression from prior cath but otherwise stable disease.   LEFT HEART CATHETERIZATION WITH CORONARY ANGIOGRAM N/A 07/07/2011   Procedure: LEFT HEART CATHETERIZATION WITH CORONARY ANGIOGRAM;  Surgeon: DLeonie Man MD;  [facility];  Service: Cardiovascular;  inferolat post LV infarct  ST-elevation MI   MET/CPET  11/09/2011   submax. effort 1.05 RER peak V02 was 51% ,chronotropic incomp.hrt rate lows 80s to 100   MET/CPET  September 2016   [facility]: Mild functional impairment due primarily to mild pulmonary and circulatory limitations. Also suggest physical deconditioning (seen with low-normal aerobic reserve). Mild VQ mismatch with exercise.  Ventilatory reserve exhausted with peak exercise demonstrated pulmonary limitation. No significant decrease in postexercise FEV1 compared to rest --> suggest no exercise induced bronchospasm   NM MYOVIEW ([facility] HX)  11/30/2011   EF 45% ,exercise 10 METS, infarct\scar w mild perinfarct ischemia -basal inferolat and mid inferolat region   NM MYOVIEW LTD  04/25/2016   Exercised for 9 minutes. Reached 90% max. Heart rate. 9.4 METS. Read as a low risk study with normal EF 55-65%. -> on Review -  there does appear to be a small to moderate sized, medium severtiy reversible perfusion defect in the anterior wall - read by computer, but not noted by reader.   PERCUTANEOUS CORONARY STENT INTERVENTION (PCI-S)  07/07/2011   Procedure: PERCUTANEOUS CORONARY STENT INTERVENTION (PCI-S);  Surgeon: Leonie Man, MD;  [facility];  Service: Cardiovascular;;occluded RCA ,Integrity Resolute DES 2.75 x 18 mm stent - post-dilated 3.1 mm   PERCUTANEOUS CORONARY STENT INTERVENTION (PCI-S)  07/30/2013   Procedure: PERCUTANEOUS CORONARY STENT INTERVENTION (PCI-S);  Surgeon: Leonie Man, MD;  Location[facility] C[facility] LAB;  Service: Cardiovascular;;Distal mid LAD: Xience Alpine DES 2.25 mm x 28 mm (overlapping proximal and distal edge of previous stent) - 2.7 mm; proximal LAD 2.5 mm x 12 mm Xience Alpine DES (2.8 mm);; RCA 60-70% stenosis planned staged procedure   PERCUTANEOUS CORONARY STENT INTERVENTION (PCI-S) N/A 11/15/2013   Procedure: PERCUTANEOUS CORONARY STENT INTERVENTION (PCI-S);  Surgeon: Leonie Man, MD;  [facility]: [facility] CATH LAB;  Service: Cardiovascular;   Patent Cx & LAD Stents; PCI -dRCA Promus Premier DES 4.0 mm x 16 mm (4.25 mm) dRCA, 2.0 mm Cutting PTCA of RPL2 [place] & POBA of mRPDA   POLYPECTOMY  03/28/2015   Procedure: POLYPECTOMY;  Surgeon: Lucilla Lame, MD;  Location: [place];  Service: Endoscopy;;   POLYPECTOMY N/A 06/30/2020   Procedure: POLYPECTOMY;  Surgeon: Lucilla Lame, MD;  Location: [place];  Service: Endoscopy;  Laterality: N/A;   RIGHT HEART CATH  June 2015   Normal pressures with severely reduced cardiac output and index. (3.25/1.55)   TRANSTHORACIC ECHOCARDIOGRAM  07/31/2013; February 2017   a. LVEF 45-50. Inferior posterior hypokinesis with mild dilation. Apparent normal diastolic pressures;    UMBILICAL HERNIA REPAIR N/A 04/02/2015   Procedure: HERNIA REPAIR UMBILICAL ADULT;  Surgeon: Florene Glen, MD;  Location: [facility];  Service: General;  Laterality: N/A;   Family History  Problem Relation Age of Onset   Coronary artery disease Father    Heart disease Father    Stroke Father    Hypertension Father    Hyperlipidemia Mother    Diabetes Mother    Hyperlipidemia Maternal Grandmother    Diabetes Maternal Grandmother    Hyperlipidemia Maternal Grandfather    Diabetes Maternal Grandfather    Emphysema Maternal Grandfather    Heart disease Paternal Grandmother    Heart disease Paternal Grandfather    Cancer Maternal Aunt        breast   Social History   Socioeconomic History   Marital status: Married    Spouse name: Not on file   Number of children: Not on file   Years of education: Not on file   Highest education level: Not on file  Occupational History   Not on file  Tobacco Use   Smoking status: Every Day    Packs/day: 0.50    Years: 40.00    Total pack years: 20.00    Types: Cigarettes   Smokeless tobacco: Never  Tobacco comments:    Quit with Bupropion 134m - > unfortunately, he restarted  Vaping Use   Vaping Use: Never used  Substance and Sexual Activity   Alcohol  use: Yes    Comment: rare   Drug use: No   Sexual activity: Not on file  Other Topics Concern   Not on file  Social History Narrative   He is a married father of 834 grandfather of 852 He does not really get routine exercise.    He is down to 5 or 6 cigarettes a day. He is very seriously wanting to quit. This is a significant cutback for him, but he has pretty much been told he needs to stop by his wife, and so he fully intends to do so. He is willing to try the patches or whatever. He has a social alcoholic beverage every now and then.    Social Determinants of Health   Financial Resource Strain: Low Risk  (10/09/2021)   Overall Financial Resource Strain (CARDIA)    Difficulty of Paying Living Expenses: Not hard at all  Food Insecurity: No Food Insecurity (10/09/2021)   Hunger Vital Sign    Worried About Running Out of Food in the Last Year: Never true    Ran Out of Food in the Last Year: Never true  Transportation Needs: No Transportation Needs (10/09/2021)   PRAPARE - THydrologist(Medical): No    Lack of Transportation (Non-Medical): No  Physical Activity: Insufficiently Active (10/09/2021)   Exercise Vital Sign    Days of Exercise per Week: 2 days    Minutes of Exercise per Session: 20 min  Stress: No Stress Concern Present (10/09/2021)   [place]   Feeling of Stress : Not at all  Social Connections: Moderately Integrated (10/09/2021)   Social Connection and Isolation Panel [NHANES]    Frequency of Communication with Friends and Family: More than three times a week    Frequency of Social Gatherings with Friends and Family: Once a week    Attends Religious Services: 1 to 4 times per year    Active Member of [employer] Organizations: No    Attends CMusic therapist Never    Marital Status: Married    Tobacco Counseling Ready to quit: Not Answered Counseling given: Not  Answered Tobacco comments: Quit with Bupropion 1572m- > unfortunately, he restarted   Clinical Intake:  Pre-visit preparation completed: Yes  Pain : No/denies pain     Nutritional Risks: None Diabetes: No  How often do you need to have someone help you when you read instructions, pamphlets, or other written materials from your doctor or pharmacy?: 1 - Never  Diabetic?no  Interpreter Needed?: No  Information entered by :: LoKirke ShaggyLPN   Activities of Daily Living    10/09/2021   10:23 AM 08/13/2021    4:04 PM  In your present state of health, do you have any difficulty performing the following activities:  Hearing? 1 1  Vision? 1 1  Difficulty concentrating or making decisions? 0 0  Walking or climbing stairs? 0 0  Dressing or bathing? 0 0  Doing errands, shopping? 0 0  Preparing Food and eating ? N   Using the Toilet? N   In the past six months, have you accidently leaked urine? N   Do you have problems with loss of bowel control? N   Managing your Medications? N  Managing your Finances? N   Housekeeping or managing your Housekeeping? N     Patient Care Team: Jearld Fenton, NP as PCP - General (Internal Medicine) Leonie Man, MD as PCP - Cardiology (Cardiology)  Indicate any recent Medical Services you may have received from other than [facility] providers in the past year (date may be approximate).     Assessment:   This is a routine wellness examination for [place].  Hearing/Vision screen Hearing Screening - Comments:: No aids Vision Screening - Comments:: Wears glasses- [facility]  Dietary issues and exercise activities discussed: Current Exercise Habits: The patient does not participate in regular exercise at present   Goals Addressed             This Visit's Progress    DIET - EAT MORE FRUITS AND VEGETABLES         Depression Screen    10/09/2021   10:20 AM 08/13/2021    3:14 PM 05/05/2020   12:34 PM 07/08/2018    8:50 PM 07/01/2017     2:56 PM 07/01/2017    2:55 PM 03/30/2016    1:40 PM  PHQ 2/9 Scores  PHQ - 2 Score 0 0 0 0 0 0 0  PHQ- 9 Score 0 0         Fall Risk    10/09/2021   10:23 AM 08/13/2021    4:04 PM 05/05/2020   12:34 PM 07/04/2018    2:56 PM 07/01/2017    2:56 PM  Fall Risk   Falls in the past year? 0 1 1 0 Yes  Number falls in past yr: 0 0   1  Injury with Fall? 0 0   No  Risk for fall due to : No Fall Risks History of fall(s);No Fall Risks     Follow up Falls evaluation completed Falls evaluation completed       [place]:  Any stairs in or around the home? Yes  If so, are there any without handrails? No  Home free of loose throw rugs in walkways, pet beds, electrical cords, etc? Yes  Adequate lighting in your home to reduce risk of falls? Yes   ASSISTIVE DEVICES UTILIZED TO PREVENT FALLS:  Life alert? No  Use of a cane, walker or w/c? No  Grab bars in the bathroom? Yes  Shower chair or bench in shower? No  Elevated toilet seat or a handicapped toilet? No   Cognitive Function:        10/09/2021   10:24 AM  6CIT Screen  What Year? 0 points  What month? 0 points  What time? 0 points  Count back from 20 0 points  Months in reverse 0 points  Repeat phrase 0 points  Total Score 0 points    Immunizations Immunization History  Administered Date(s) Administered   Influenza Split 11/09/2012   Influenza,inj,Quad PF,6+ Mos 11/16/2013, 11/29/2014, 11/11/2015, 11/10/2016, 12/01/2017, 12/07/2018, 12/26/2019, 12/03/2020   Moderna SARS-COV2 Booster Vaccination 05/08/2020   Moderna Sars-Covid-2 Vaccination 04/25/2019, 05/14/2019, 01/10/2020, 10/30/2020   Pneumococcal Conjugate-13 11/29/2014, 05/05/2020   Pneumococcal Polysaccharide-23 07/11/2011, 11/10/2016, 01/02/2019   Tdap 03/04/2015    TDAP status: Up to date  Flu Vaccine status: Up to date  Pneumococcal vaccine status: Up to date  Covid-19 vaccine status: Completed vaccines  Qualifies for Shingles  Vaccine? No   Zostavax completed No   Shingrix Completed?: No.    Education has been provided regarding the importance of this vaccine.  Patient has been advised to call insurance company to determine out of pocket expense if they have not yet received this vaccine. Advised may also receive vaccine at local pharmacy or Health Dept. Verbalized acceptance and understanding.  Screening Tests Health Maintenance  Topic Date Due   Zoster Vaccines- Shingrix (1 of 2) Never done   OPHTHALMOLOGY EXAM  01/11/2017   FOOT EXAM  07/02/2018   INFLUENZA VACCINE  09/08/2021   COVID-19 Vaccine (5 - Moderna risk series) 10/09/2021 (Originally 12/25/2020)   URINE MICROALBUMIN  12/23/2021   HEMOGLOBIN A1C  02/14/2022   TETANUS/TDAP  03/03/2025   COLONOSCOPY (Pts 45-26yr Insurance coverage will need to be confirmed)  06/30/2025   HIV Screening  Completed   Hepatitis C Screening  Addressed   HPV VACCINES  Aged Out    Health Maintenance  Health Maintenance Due  Topic Date Due   Zoster Vaccines- Shingrix (1 of 2) Never done   OPHTHALMOLOGY EXAM  01/11/2017   FOOT EXAM  07/02/2018   INFLUENZA VACCINE  09/08/2021    Colorectal cancer screening: Type of screening: Colonoscopy. Completed 06/30/20. Repeat every 5 years  Lung Cancer Screening: (Low Dose CT Chest recommended if Age 61-80years, 30 pack-year currently smoking OR have quit w/in 15years.) does qualify.   Lung Cancer Screening Referral: declined  Additional Screening:  Hepatitis C Screening: does qualify; Completed 03/31/16  Vision Screening: Recommended annual ophthalmology exams for early detection of glaucoma and other disorders of the eye. Is the patient up to date with their annual eye exam?  Yes  Who is the provider or what is the name of the office in which the patient attends annual eye exams? [place] is not established with a provider, would they like to be referred to a provider to establish care? No .   Dental  Screening: Recommended annual dental exams for proper oral hygiene  Community Resource Referral / Chronic Care Management: CRR required this visit?  No   CCM required this visit?  No      Plan:     I have personally reviewed and noted the following in the patient's chart:   Medical and social history Use of alcohol, tobacco or illicit drugs  Current medications and supplements including opioid prescriptions. Patient is not currently taking opioid prescriptions. Functional ability and status Nutritional status Physical activity Advanced directives List of other physicians Hospitalizations, surgeries, and ER visits in previous 12 months Vitals Screenings to include cognitive, depression, and falls Referrals and appointments  In addition, I have reviewed and discussed with patient certain preventive protocols, quality metrics, and best practice recommendations. A written personalized care plan for preventive services as well as general preventive health recommendations were provided to patient.     LDionisio David LPN   [id number]/07/[id number]  Nurse Notes: none

## 2021-10-09 NOTE — Patient Instructions (Signed)
Mr. Troy Parrish , Thank you for taking time to come for your Medicare Wellness Visit. I appreciate your ongoing commitment to your health goals. Please review the following plan we discussed and let me know if I can assist you in the future.   Screening recommendations/referrals: Colonoscopy: 06/30/20 Recommended yearly ophthalmology/optometry visit for glaucoma screening and checkup Recommended yearly dental visit for hygiene and checkup  Vaccinations: Influenza vaccine: 12/03/20 Pneumococcal vaccine: 05/05/20 Tdap vaccine: 03/04/15 Shingles vaccine: n/d   Covid-19: 04/25/19, 05/14/19, 01/10/20, 05/08/20, 10/30/20  Advanced directives: no  Conditions/risks identified: none  Next appointment: Follow up in one year for your annual wellness visit - 10/15/22 @ 11:45 am by phone  Preventive Care 40-64 Years, Male Preventive care refers to lifestyle choices and visits with your health care provider that can promote health and wellness. What does preventive care include? A yearly physical exam. This is also called an annual well check. Dental exams once or twice a year. Routine eye exams. Ask your health care provider how often you should have your eyes checked. Personal lifestyle choices, including: Daily care of your teeth and gums. Regular physical activity. Eating a healthy diet. Avoiding tobacco and drug use. Limiting alcohol use. Practicing safe sex. Taking low-dose aspirin every day starting at age 73. What happens during an annual well check? The services and screenings done by your health care provider during your annual well check will depend on your age, overall health, lifestyle risk factors, and family history of disease. Counseling  Your health care provider may ask you questions about your: Alcohol use. Tobacco use. Drug use. Emotional well-being. Home and relationship well-being. Sexual activity. Eating habits. Work and work Statistician. Screening  You may have the following  tests or measurements: Height, weight, and BMI. Blood pressure. Lipid and cholesterol levels. These may be checked every 5 years, or more frequently if you are over 55 years old. Skin check. Lung cancer screening. You may have this screening every year starting at age 39 if you have a 30-pack-year history of smoking and currently smoke or have quit within the past 15 years. Fecal occult blood test (FOBT) of the stool. You may have this test every year starting at age 72. Flexible sigmoidoscopy or colonoscopy. You may have a sigmoidoscopy every 5 years or a colonoscopy every 10 years starting at age 84. Prostate cancer screening. Recommendations will vary depending on your family history and other risks. Hepatitis C blood test. Hepatitis B blood test. Sexually transmitted disease (STD) testing. Diabetes screening. This is done by checking your blood sugar (glucose) after you have not eaten for a while (fasting). You may have this done every 1-3 years. Discuss your test results, treatment options, and if necessary, the need for more tests with your health care provider. Vaccines  Your health care provider may recommend certain vaccines, such as: Influenza vaccine. This is recommended every year. Tetanus, diphtheria, and acellular pertussis (Tdap, Td) vaccine. You may need a Td booster every 10 years. Zoster vaccine. You may need this after age 37. Pneumococcal 13-valent conjugate (PCV13) vaccine. You may need this if you have certain conditions and have not been vaccinated. Pneumococcal polysaccharide (PPSV23) vaccine. You may need one or two doses if you smoke cigarettes or if you have certain conditions. Talk to your health care provider about which screenings and vaccines you need and how often you need them. This information is not intended to replace advice given to you by your health care provider. Make sure you discuss  any questions you have with your health care provider. Document  Released: 02/21/2015 Document Revised: 10/15/2015 Document Reviewed: 11/26/2014 Elsevier Interactive Patient Education  2017 [place] Prevention in the Home Falls can cause injuries. They can happen to people of all ages. There are many things you can do to make your home safe and to help prevent falls. What can I do on the outside of my home? Regularly fix the edges of walkways and driveways and fix any cracks. Remove anything that might make you trip as you walk through a door, such as a raised step or threshold. Trim any bushes or trees on the path to your home. Use bright outdoor lighting. Clear any walking paths of anything that might make someone trip, such as rocks or tools. Regularly check to see if handrails are loose or broken. Make sure that both sides of any steps have handrails. Any raised decks and porches should have guardrails on the edges. Have any leaves, snow, or ice cleared regularly. Use sand or salt on walking paths during winter. Clean up any spills in your garage right away. This includes oil or grease spills. What can I do in the bathroom? Use night lights. Install grab bars by the toilet and in the tub and shower. Do not use towel bars as grab bars. Use non-skid mats or decals in the tub or shower. If you need to sit down in the shower, use a plastic, non-slip stool. Keep the floor dry. Clean up any water that spills on the floor as soon as it happens. Remove soap buildup in the tub or shower regularly. Attach bath mats securely with double-sided non-slip rug tape. Do not have throw rugs and other things on the floor that can make you trip. What can I do in the bedroom? Use night lights. Make sure that you have a light by your bed that is easy to reach. Do not use any sheets or blankets that are too big for your bed. They should not hang down onto the floor. Have a firm chair that has side arms. You can use this for support while you get dressed. Do  not have throw rugs and other things on the floor that can make you trip. What can I do in the kitchen? Clean up any spills right away. Avoid walking on wet floors. Keep items that you use a lot in easy-to-reach places. If you need to reach something above you, use a strong step stool that has a grab bar. Keep electrical cords out of the way. Do not use floor polish or wax that makes floors slippery. If you must use wax, use non-skid floor wax. Do not have throw rugs and other things on the floor that can make you trip. What can I do with my stairs? Do not leave any items on the stairs. Make sure that there are handrails on both sides of the stairs and use them. Fix handrails that are broken or loose. Make sure that handrails are as long as the stairways. Check any carpeting to make sure that it is firmly attached to the stairs. Fix any carpet that is loose or worn. Avoid having throw rugs at the top or bottom of the stairs. If you do have throw rugs, attach them to the floor with carpet tape. Make sure that you have a light switch at the top of the stairs and the bottom of the stairs. If you do not have them, ask someone to add  them for you. What else can I do to help prevent falls? Wear shoes that: Do not have high heels. Have rubber bottoms. Are comfortable and fit you well. Are closed at the toe. Do not wear sandals. If you use a stepladder: Make sure that it is fully opened. Do not climb a closed stepladder. Make sure that both sides of the stepladder are locked into place. Ask someone to hold it for you, if possible. Clearly mark and make sure that you can see: Any grab bars or handrails. First and last steps. Where the edge of each step is. Use tools that help you move around (mobility aids) if they are needed. These include: Canes. Walkers. Scooters. Crutches. Turn on the lights when you go into a dark area. Replace any light bulbs as soon as they burn out. Set up your  furniture so you have a clear path. Avoid moving your furniture around. If any of your floors are uneven, fix them. If there are any pets around you, be aware of where they are. Review your medicines with your doctor. Some medicines can make you feel dizzy. This can increase your chance of falling. Ask your doctor what other things that you can do to help prevent falls. This information is not intended to replace advice given to you by your health care provider. Make sure you discuss any questions you have with your health care provider. Document Released: 11/21/2008 Document Revised: 07/03/2015 Document Reviewed: 03/01/2014 Elsevier Interactive Patient Education  2017 [employer].

## 2021-10-14 ENCOUNTER — Encounter: Payer: Self-pay | Admitting: Internal Medicine

## 2021-10-14 ENCOUNTER — Ambulatory Visit: Payer: PPO | Admitting: Cardiology

## 2021-10-19 ENCOUNTER — Telehealth: Payer: Self-pay | Admitting: Cardiology

## 2021-10-19 ENCOUNTER — Ambulatory Visit: Payer: PPO | Attending: Cardiology | Admitting: Cardiology

## 2021-10-19 ENCOUNTER — Other Ambulatory Visit: Payer: Self-pay

## 2021-10-19 ENCOUNTER — Encounter: Payer: Self-pay | Admitting: Cardiology

## 2021-10-19 ENCOUNTER — Other Ambulatory Visit (HOSPITAL_COMMUNITY): Payer: Self-pay

## 2021-10-19 VITALS — BP 128/86 | HR 71 | Ht 71.0 in | Wt 175.8 lb

## 2021-10-19 DIAGNOSIS — I700 Atherosclerosis of aorta: Secondary | ICD-10-CM

## 2021-10-19 DIAGNOSIS — G4733 Obstructive sleep apnea (adult) (pediatric): Secondary | ICD-10-CM

## 2021-10-19 DIAGNOSIS — I255 Ischemic cardiomyopathy: Secondary | ICD-10-CM | POA: Diagnosis not present

## 2021-10-19 DIAGNOSIS — E118 Type 2 diabetes mellitus with unspecified complications: Secondary | ICD-10-CM | POA: Diagnosis not present

## 2021-10-19 DIAGNOSIS — E1169 Type 2 diabetes mellitus with other specified complication: Secondary | ICD-10-CM

## 2021-10-19 DIAGNOSIS — I5032 Chronic diastolic (congestive) heart failure: Secondary | ICD-10-CM | POA: Diagnosis not present

## 2021-10-19 DIAGNOSIS — I10 Essential (primary) hypertension: Secondary | ICD-10-CM

## 2021-10-19 DIAGNOSIS — Z01810 Encounter for preprocedural cardiovascular examination: Secondary | ICD-10-CM | POA: Diagnosis not present

## 2021-10-19 DIAGNOSIS — E785 Hyperlipidemia, unspecified: Secondary | ICD-10-CM

## 2021-10-19 DIAGNOSIS — Z720 Tobacco use: Secondary | ICD-10-CM

## 2021-10-19 DIAGNOSIS — I2111 ST elevation (STEMI) myocardial infarction involving right coronary artery: Secondary | ICD-10-CM

## 2021-10-19 DIAGNOSIS — I25119 Atherosclerotic heart disease of native coronary artery with unspecified angina pectoris: Secondary | ICD-10-CM

## 2021-10-19 DIAGNOSIS — H2512 Age-related nuclear cataract, left eye: Secondary | ICD-10-CM | POA: Diagnosis not present

## 2021-10-19 MED ORDER — SILDENAFIL CITRATE 50 MG PO TABS
ORAL_TABLET | ORAL | 6 refills | Status: DC
  Filled 2021-10-19: qty 10, fill #0

## 2021-10-19 MED ORDER — PRASUGREL HCL 10 MG PO TABS
10.0000 mg | ORAL_TABLET | Freq: Every evening | ORAL | 3 refills | Status: DC
  Filled 2021-10-19: qty 90, 90d supply, fill #0

## 2021-10-19 MED ORDER — SILDENAFIL CITRATE 50 MG PO TABS: ORAL_TABLET | ORAL | 6 refills | Status: DC

## 2021-10-19 MED ORDER — EFFIENT 10 MG PO TABS
10.0000 mg | ORAL_TABLET | Freq: Every day | ORAL | 3 refills | Status: DC
  Filled 2021-10-19: qty 90, 90d supply, fill #0
  Filled 2022-01-22 (×4): qty 90, 90d supply, fill #1
  Filled 2022-04-22: qty 90, 90d supply, fill #2
  Filled 2022-07-17: qty 90, 90d supply, fill #3

## 2021-10-19 NOTE — Telephone Encounter (Signed)
Pt c/o medication issue:  1. Name of Medication: effient and another medication he cannot remember the name of  2. How are you currently taking this medication (dosage and times per day)? Has not started  3. Are you having a reaction (difficulty breathing--STAT)? no  4. What is your medication issue? Patient states he was prescribed 2 medications, but the pharmacy only received the effient. He says he is not sure if the other medication was sent to [facility].

## 2021-10-19 NOTE — Patient Instructions (Addendum)
Medication Instructions:   Continue all medicaitons   Can use Sildenafil 25  mg to 50 mg  1/2 to 1 tablet as needed - do not use if taken NTG tablets in 48 hours  *If you need a refill on your cardiac medications before your next appointment, please call your pharmacy*   Lab Work:  Not needed     Testing/Procedures:  Not needed  Follow-Up: At [facility], you and your health needs are our priority.  As part of our continuing mission to provide you with exceptional heart care, we have created designated Provider Care Teams.  These Care Teams include your primary Cardiologist (physician) and Advanced Practice Providers (APPs -  Physician Assistants and Nurse Practitioners) who all work together to provide you with the care you need, when you need it.  We recommend signing up for the patient portal called "MyChart".  Sign up information is provided on this After Visit Summary.  MyChart is used to connect with patients for Virtual Visits (Telemedicine).  Patients are able to view lab/test results, encounter notes, upcoming appointments, etc.  Non-urgent messages can be sent to your provider as well.   To learn more about what you can do with MyChart, go to [email or web address].    Your next appointment:   6 month(s)  The format for your next appointment:   In Person  Provider:   Glenetta Hew, MD    Other Instructions

## 2021-10-19 NOTE — Telephone Encounter (Signed)
Called pharmacy to verify they received both prescriptions. Pharmacy staff verbalized they had both prescriptions but they had to order Effient and it should be in tomorrow. Called pt to inform him Effient would be in tomorrow and he could pick it up after 12. He states she just spoke to someone and arranged the medication to be mailed to him. He requested Viagra be sent to [facility] in [place]. Prescription sent the Kristopher Oppenheim per his request. [place] back , spoke with Aaron Edelman, I let him know the Viagra was sent to another pharmacy. He seen it in the computer, they will not fill Viagra since it was sent to another pharmacy.

## 2021-10-19 NOTE — Progress Notes (Signed)
Primary Care Provider: Jearld Fenton, NP [place] cardiologist: Glenetta Hew, MD Electrophysiologist: None  Clinic Note: Chief Complaint  Patient presents with   Coronary Artery Disease    No active angina or CHF.   Follow-up    6 months.  Doing well.   ===================================  ASSESSMENT/PLAN   Problem List Items Addressed This Visit       Cardiology Problems   Cardiomyopathy, ischemic; EF~45% with hypokinesis of the inferior and inferolateral myocardium; CO 3.25 (Chronic)    Ischemic CM, but NYHA class I HF symptoms.  Euvolemict.  BP is actually been relatively low and therefore is only on low-dose Toprol (we did recently increase to 37.5 mg more for ectopy.  If pressures tend to drift up, would have a low threshold to restart ARB dose.  But for now we will hold off.  No diuretic requirement.      Relevant Orders   EKG 12-Lead (Completed)   Chronic diastolic congestive heart failure, NYHA class 1 (HCC) (Chronic)    Euvolemic.  No major issues.  Not requiring diuretic.  He is simply on low-dose Toprol.  If glycemic control becomes an issue, would probably consider SGLT2 inhibitor, but for now we will hold off as both glycemic control and the control seems to be stable.  In the past he has had issues with decompensation, need to monitor closely.      Coronary artery disease involving native coronary artery of native heart with angina pectoris ([place]) - Primary (Chronic)    Multivessel disease multivessel PCI.  Without any active angina.  He is very active tolerating his 37.5 mg Toprol daily.  No longer on nitrate.  Plan: Continue current dose of Toprol 37.5 mg daily. On combination of 40 mg rosuvastatin, fish oil and fenofibrate well-controlled lipids.\ Remains on maintenance dose Effient.  We talked about whether we should switch to clopidogrel, but he has had issues with catheter in the past including having significant in-stent  restenosis/thrombosis.  He would prefer to stay on a more potent medication.  He had respiratory issues with Brilinta. Okay to hold Effient 7 to 9 days preop for surgeries or procedures.      STEMI, acute inf. wall with 100% RCA stenosis, DES 07/07/11 (prior Anterior STEMI in 2001) (Chronic)    He has now had 3 STEMI is an ACS presentation involving all 3 major coronary arteries and has had PCI to all 3 major branches.  Stents are patent as of 2020.  Is doing relatively well since then. He does have mild reduced EF but no signs or symptoms of heart failure. Blood pressure actually makes it difficult to titrate guideline directed medical therapy.      Relevant Orders   EKG 12-Lead (Completed)   Essential hypertension (Chronic)    Not really hypertensive.  Is on modest dose Toprol.  In the past we have had to discontinue ACE inhibitor/ARB medications.      Hyperlipidemia associated with type 2 diabetes mellitus ([place]) (Chronic)    Lipids as of July looked good.  Well-controlled.  LDL is lower than it had been.  Stable at 53.  Continue rosuvastatin, fish oil and fenofibrate.  Triglycerides borderline but pretty well controlled.  Should be due for follow-up labs in about 4 months.      Aortic atherosclerosis (HCC) (Chronic)    On aggressive CV risk factor modification with medications as noted in CAD section        Other  Type 2 diabetes mellitus with complication [facility])    Not current medications.  Follow-up with PCP.      Relevant Orders   EKG 12-Lead (Completed)   Preoperative cardiovascular examination    Completely asymptomatic with revascularization about 5 years ago but no ongoing symptoms.  Reduced EF but grade 1 CHF.  No recurrent angina on stable meds.  Would not recommend further evaluation if surgical procedures warranted, would simply proceed.  He would need to hold his antiplatelet agent.  Currently on Effient milligram which can be held for 9 days preop for surgeries or  procedures.  Would restart when safe postop.  Based on his risks would be a moderate risk patient for what ever surgery, but further testing would not alter therapy.  Therefore, another test would not be warranted.      OSA (obstructive sleep apnea) (Chronic)    Not currently using CPAP.  Seems to be sleeping well.   We will review his Epworth score and follow-up visit to determine if he would continue to benefit.      Tobacco abuse (Chronic)    Done on 1-2 cigarettes a day with some days skipping days.  He seems to be followed on today than he had been in the past for smoking cessation.  Encouraged him to continue to discontinue smoking       ===================================  HPI:    Troy Jallow. is a 61 y.o. male with a PMH below who presents today for 64-month follow-up.  PMH  Longstanding MV CAD-PCI (has had a STEMI of all 3 major arteries),  Last PCI was in 2018 Last cath 2020-patent stents ICM with EF roughly 45%-NYHA Class I CHF,  HLD on multiple medications -> most recent TC 114, LDL 53, TG 154, HDL 37 HTN  DM-2-diet controlled.  A1c 5.87 08/2021 Long-term smoker-now fully quit after several prolonged attempts. Mild associated COPD  Troy Lucena. was last seen on May 08, 2021: He had cut back to about 3 to 4 cigarettes a day which was the intention.  Mild baseline dyspnea because of COPD, and 1 brief 10-second episode of chest pain that resolved spontaneously.  Otherwise doing well.  Doing normal activities including grocery shopping for up to 2 hours.  No formal exercise, but was making a concerted effort to increase his level of hiking and walking with his kids and grandkids as well as wife.  Likes to travel.  Less coughing with reduced cigarettes.  Recent Hospitalizations: None  Reviewed  CV studies:    The following studies were reviewed today: (if available, images/films reviewed: From Epic Chart or Care Everywhere) None:  Interval History:    Troy Salomon. returns here for 70-month follow-up again doing amazingly well.  He just got back in his trip with his wife and sons to [place].  They actually went up to see [place] and [facility].  He enjoyed doing his hiking and really was not bothered by being at the altitude.  In fact, his endurance and breathing was better than his wife and kids.  In the next week or so, he and his wife are going down further anniversary to [place] and then probably back to [place].  He is in great spirits.  Happy.  Had no chest pain pressure or even significant dyspnea with exertion.  Has not had to use his albuterol that much at all, just a couple times while in the mountains.  No PND, orthopnea  or edema.  No rapid regular heartbeats palpitations.  No syncope/near syncope or TIA/amaurosis fugax.  No claudication.  He is definitely working on smoking cessation, but every now and then slips up.  He is able to go several days now without a cigarette.  Close to making the final stop.  REVIEWED OF SYSTEMS   Review of Systems  Constitutional:  Negative for malaise/fatigue (Energy level doing well.).  Respiratory:  Positive for cough (Mild chronic cough smoker's cough with occasional wheezing.).   Musculoskeletal:  Positive for joint pain.       Mild aches and pains   Psychiatric/Behavioral:         Much more stable.  No more signs of dysthymia.  In great spirits.   I have reviewed and (if needed) personally updated the patient's problem list, medications, allergies, past medical and surgical history, social and family history.   PAST MEDICAL HISTORY   Past Medical History:  Diagnosis Date   Bronchitis    Gets bronchitis almost every year   CAD S/P percutaneous coronary angioplasty 06/2011; 6/ & 11/2013   S/P PCI to all 3 major vessels;a)  Ant STEMI 2001- BMS-LAD x2 -->(redo PCI 6/'15 -- pLAD Xience DES 2.5 x 12- 2.75 mm, mLAD 2.25 x 12 - 2.7 mm), b) '06 UA --> Cx- OM DES; c) 2013 Inf MI BMS  mRCA --> d) 10/'15 PCI dRCA Promus P DES 4.0 x 16 (4.25 mm); PTCA of RPL2 (2.0 mm) &RPDA (2.25 mm) 11/2013; 4/18 PCI OM1 Synergy DES  2.5 x 16   COPD (chronic obstructive pulmonary disease) (HCC)    Elevated WBC count    Emphysema of lung ([place])    Essential hypertension 07/07/2011   Former heavy tobacco smoker     quit in May 2015 after multiple attempts at trying to quit before    GERD (gastroesophageal reflux disease)    Glucose intolerance (pre-diabetes) June 2015    hemoglobin A1c 6.6   Headache    "Imdur related; stopped taking it; headaches went away" (11/15/2013)   Heart murmur    History of colon polyps    History of stomach ulcers    Hyperlipidemia with target LDL less than 70 1/85/6314   Metabolic syndrome    Pre-diabetes, hypertension and truncal obesity as well as dyslipidemia   OSA on CPAP    Pneumonia "several times"   Recurrent upper respiratory infection (URI)    Seizures ([place]) "several"   "last one was ~ 2011" (11/15/2013)   Shortness of breath dyspnea    ST elevation myocardial infarction (STEMI) of anterior wall ([place]) 2001   History of -- 2 stents in early and distal mid LAD; prior cardiologist was Dr. Remi Haggard in [place] elevation myocardial infarction (STEMI) of inferior wall ([place]) 07/07/2011   Occluded RCA 2.75X18 INTEGRITY; Echo 07/2013: EF 45-50%, Inferior & Posterolateral HK.    Umbilical hernia    Unstable angina ([place]) 2006; 07/2013   Cx-OM - PCI 2.5 mm 13 mm Cypher DES [name], [place]) ; 07/2013: Severe ISR of both prox & Distal LAD stentS --> 2 DES stents (1 at prox edge of the proximal stent, 2nd covers the entire distal stent as well as proximal and distal edge stenose)    Upper respiratory infection     PAST SURGICAL HISTORY   Past Surgical History:  Procedure Laterality Date   BIOPSY N/A 06/30/2020   Procedure: BIOPSY;  Surgeon: Lucilla Lame, MD;  Location: [place];  Service:  Endoscopy;  Laterality: N/A;   Cardiac MRI [facility]   09/2014   EF 51%. Mod HK of basal-mid Inf wall, mild HK of basal inferoseptum & basal-mid inferolateral wall with hyper enhancement (c/w subendocard RCA MI with significant viability), focal hyper enhancement of apical wall c/w dLAD subendocard MI & complete LAD viability. . No aortic stenosis. Normal RV function. No Ischemia on Adenosine Stress.   COLONOSCOPY WITH PROPOFOL N/A 03/28/2015   Procedure: COLONOSCOPY WITH PROPOFOL;  Surgeon: Lucilla Lame, MD;  Location: [place];  Service: Endoscopy;  Laterality: N/A;   COLONOSCOPY WITH PROPOFOL N/A 06/30/2020   Procedure: COLONOSCOPY WITH PROPOFOL;  Surgeon: Lucilla Lame, MD;  Location: [place];  Service: Endoscopy;  Laterality: N/A;   CORONARY ANGIOPLASTY WITH STENT PLACEMENT  2001   ANTERIOR mi with a  PCI to LAD; [place], [place] - Dr. Dorris Fetch   CORONARY ANGIOPLASTY WITH STENT PLACEMENT  2006   [place] by Dr Dorris Fetch -lesion lft circ/OM1 with 2.5 x 8m Cypher DES   CORONARY STENT INTERVENTION N/A 05/19/2016   Procedure: Coronary Stent Intervention;  Surgeon: DLeonie Man MD;  [place];  Service: Cardiovascular: PCI pOM1 - Synergy DES 2.5 x 16 (overlaps old stent)   ESOPHAGOGASTRODUODENOSCOPY (EGD) WITH PROPOFOL N/A 03/28/2015   Procedure: ESOPHAGOGASTRODUODENOSCOPY (EGD) WITH PROPOFOL;  Surgeon: DLucilla Lame MD;  [place]  Service: Endoscopy;  Laterality: N/A;   ESOPHAGOGASTRODUODENOSCOPY (EGD) WITH PROPOFOL N/A 06/30/2020   Procedure: ESOPHAGOGASTRODUODENOSCOPY (EGD) WITH PROPOFOL;  Surgeon: WLucilla Lame MD;  [place]  Service: Endoscopy;  Laterality: N/A;   ESOPHAGOGASTRODUODENOSCOPY (EGD) WITH PROPOFOL N/A 09/11/2020   Procedure: ESOPHAGOGASTRODUODENOSCOPY (EGD) WITH PROPOFOL;  Surgeon: WLucilla Lame MD;  [place]  Service: Endoscopy;  Laterality: N/A;   INSERTION OF MESH N/A 04/02/2015   Procedure: INSERTION OF MESH;  Surgeon: RFlorene Glen MD;  Location: [facility];  Service: General;  Laterality: N/A;   LEFT AND RIGHT HEART CATHETERIZATION WITH CORONARY ANGIOGRAM N/A 07/30/2013   Procedure: LEFT AND RIGHT HEART CATHETERIZATION WITH CORONARY ANGIOGRAM;  Surgeon: DLeonie Man MD;  [facility];  Service: Cardiovascular;  2 separate P & mLAD lesions -> PCI, MOd-Severe dRCA disesase with diffuse RPL/PDA disease (FFR)   LEFT HEART CATH  08/03/2013   Procedure: LEFT HEART CATH;  Surgeon: DLeonie Man MD;  [facility];  Service: Cardiovascular;;FFR of RCA - non-flow-limiting   LEFT HEART CATH AND CORONARY ANGIOGRAPHY N/A 05/19/2016   Procedure: Left Heart Cath and Coronary Angiography;  Surgeon: DLeonie Man MD;  [place];  Service: Cardiovascular: CULPRIT - 85% pOM1 pre-stent. Stents in both proximal and distal RCA widely patent. PTCA sites in RPL & PDA widely patent with stable 70% small RPL 2. Extensive stenting in the LAD widely patent stents with mild to mod Dz btw prox & mid-distal stents.    LEFT HEART CATH AND CORONARY ANGIOGRAPHY N/A 02/13/2018   Procedure: LEFT HEART CATH AND CORONARY ANGIOGRAPHY;  Surgeon: HLeonie Man MD;  [place];  Service: Cardiovascular: Similar findings to last cath post PCI.  Stents are patent.  Roughly 50% proximal LAD lesion noted, may be progression from prior cath but otherwise stable disease.   LEFT HEART CATHETERIZATION WITH CORONARY ANGIOGRAM N/A 07/07/2011   Procedure: LEFT HEART CATHETERIZATION WITH CORONARY ANGIOGRAM;  Surgeon: DLeonie Man MD;  [facility];  Service: Cardiovascular;  inferolat post  LV infarct ST-elevation MI   MET/CPET  11/09/2011   submax. effort 1.05 RER peak V02 was 51% ,chronotropic incomp.hrt rate lows 80s to 100   MET/CPET  September 2016   [facility]: Mild functional impairment due primarily to mild pulmonary and circulatory limitations. Also suggest physical deconditioning (seen with  low-normal aerobic reserve). Mild VQ mismatch with exercise.  Ventilatory reserve exhausted with peak exercise demonstrated pulmonary limitation. No significant decrease in postexercise FEV1 compared to rest --> suggest no exercise induced bronchospasm   NM MYOVIEW ([facility] HX)  11/30/2011   EF 45% ,exercise 10 METS, infarct\scar w mild perinfarct ischemia -basal inferolat and mid inferolat region   NM MYOVIEW LTD  04/25/2016   Exercised for 9 minutes. Reached 90% max. Heart rate. 9.4 METS. Read as a low risk study with normal EF 55-65%. -> on Review -  there does appear to be a small to moderate sized, medium severtiy reversible perfusion defect in the anterior wall - read by computer, but not noted by reader.   PERCUTANEOUS CORONARY STENT INTERVENTION (PCI-S)  07/07/2011   Procedure: PERCUTANEOUS CORONARY STENT INTERVENTION (PCI-S);  Surgeon: Leonie Man, MD;  Location[facility];  Service: Cardiovascular;;occluded RCA ,Integrity Resolute DES 2.75 x 18 mm stent - post-dilated 3.1 mm   PERCUTANEOUS CORONARY STENT INTERVENTION (PCI-S)  07/30/2013   Procedure: PERCUTANEOUS CORONARY STENT INTERVENTION (PCI-S);  Surgeon: Leonie Man, MD;  Location: [facility] CATH LAB;  Service: Cardiovascular;;Distal mid LAD: Xience Alpine DES 2.25 mm x 28 mm (overlapping proximal and distal edge of previous stent) - 2.7 mm; proximal LAD 2.5 mm x 12 mm Xience Alpine DES (2.8 mm);; RCA 60-70% stenosis planned staged procedure   PERCUTANEOUS CORONARY STENT INTERVENTION (PCI-S) N/A 11/15/2013   Procedure: PERCUTANEOUS CORONARY STENT INTERVENTION (PCI-S);  Surgeon: Leonie Man, MD;  [facility] C[facility] LAB;  Service: Cardiovascular;  Patent Cx & LAD Stents; PCI -dRCA Promus Premier DES 4.0 mm x 16 mm (4.25 mm) dRCA, 2.0 mm Cutting PTCA of RPL2 Ostium & POBA of mRPDA   POLYPECTOMY  03/28/2015   Procedure: POLYPECTOMY;  Surgeon: Lucilla Lame, MD;  Location: [place];  Service: Endoscopy;;   POLYPECTOMY N/A 06/30/2020    Procedure: POLYPECTOMY;  Surgeon: Lucilla Lame, MD;  Location: [place];  Service: Endoscopy;  Laterality: N/A;   RIGHT HEART CATH  June 2015   Normal pressures with severely reduced cardiac output and index. (3.25/1.55)   TRANSTHORACIC ECHOCARDIOGRAM  07/31/2013; February 2017   a. LVEF 45-50. Inferior posterior hypokinesis with mild dilation. Apparent normal diastolic pressures;    UMBILICAL HERNIA REPAIR N/A 04/02/2015   Procedure: HERNIA REPAIR UMBILICAL ADULT;  Surgeon: Florene Glen, MD;  Location: [facility];  Service: General;  Laterality: N/A;    Immunization History  Administered Date(s) Administered   Influenza Split 11/09/2012   Influenza,inj,Quad PF,6+ Mos 11/16/2013, 11/29/2014, 11/11/2015, 11/10/2016, 12/01/2017, 12/07/2018, 12/26/2019, 12/03/2020   Moderna SARS-COV2 Booster Vaccination 05/08/2020   Moderna Sars-Covid-2 Vaccination 04/25/2019, 05/14/2019, 01/10/2020, 10/30/2020   Pneumococcal Conjugate-13 11/29/2014, 05/05/2020   Pneumococcal Polysaccharide-23 07/11/2011, 11/10/2016, 01/02/2019   Tdap 03/04/2015    MEDICATIONS/ALLERGIES   Current Meds  Medication Sig   albuterol (VENTOLIN HFA) 108 (90 Base) MCG/ACT inhaler INHALE TWO PUFFS BY MOUTH EVERY 6 HOURS AS NEEDED FOR SHORTNESS OF BREATH   blood glucose meter kit and supplies Dispense based on patient and insurance preference. Use up to four times daily as directed. (FOR ICD-10 E10.9, E11.9).   clobetasol cream (TEMOVATE) 0.05 %  Apply 1 Application topically daily as needed (eczema).   EFFIENT 10 MG TABS tablet Take 1 tablet (10 mg total) by mouth daily.   fenofibrate (TRICOR) 48 MG tablet TAKE ONE TABLET BY MOUTH DAILY   fluticasone (FLONASE) 50 MCG/ACT nasal spray Place 2 sprays into both nostrils daily.   fluticasone-salmeterol (ADVAIR) 100-50 MCG/ACT AEPB INHALE ONE PUFF BY MOUTH TWICE A DAY - IN THE MORNING AND AT BEDTIME   ketoconazole (NIZORAL) 2 % shampoo Apply 1 Application topically 2 (two)  times a week.   Lancets (ONETOUCH DELICA PLUS [id number]) MISC USE TO TEST BLOOD SUGAR UP TO 4 TIMES DAILY   Melatonin 1 MG CAPS Take 1 mg by mouth at bedtime as needed (sleep).   metoprolol succinate (TOPROL-XL) 25 MG 24 hr tablet TAKE 1 AND 1/2 TABLET BY MOUTH EVERY EVENING   MODERNA COVID-19 BIVAL BOOSTER 50 MCG/0.5ML injection    nitroGLYCERIN (NITROSTAT) 0.4 MG SL tablet Place 1 tablet (0.4 mg total) under the tongue every 5 (five) minutes as needed for chest pain.   Omega-3 Fatty Acids (FISH OIL) 1000 MG CPDR Take 3 g by mouth daily.   omeprazole (PRILOSEC) 40 MG capsule Take 1 capsule (40 mg total) by mouth daily.   ONETOUCH ULTRA test strip USE TO TEST BLOOD SUGAR UP TO FOUR TIMES DAILY   rosuvastatin (CRESTOR) 40 MG tablet TAKE ONE TABLET BY MOUTH DAILY    Allergies  Allergen Reactions   Niacin And Related Hives and Itching    SOCIAL HISTORY/FAMILY HISTORY   Reviewed in Epic:  Pertinent findings:  Social History   Tobacco Use   Smoking status: Every Day    Packs/day: 0.10    Years: 40.00    Total pack years: 4.00    Types: Cigarettes   Smokeless tobacco: Never   Tobacco comments:    Quit with Bupropion 131m - > unfortunately, he restarted; has cut all the way down to maybe 1 or 2 cigarettes a day, some days he goes without any.-Some days even multiple days without cigarettes.  Vaping Use   Vaping Use: Never used  Substance Use Topics   Alcohol use: Yes    Comment: rare   Drug use: No   Social History   Social History Narrative   He is a married father of 880 grandfather of 831 He does not really get routine exercise.    He is down to 1-2 cigarettes a day-sometimes going more than 2 to 3 days without 1.. He is very seriously wanting to quit.     He has a social alcoholic beverage every now and then.       He is no longer working, medically retired essentially.  His wife still works.  He and his wife enjoy traveling have done multiple trips and vacations over the  last couple years [place].      [place] and then [place]  Lots of walking and hiking.    OBJCTIVE -PE, EKG, labs   Wt Readings from Last 3 Encounters:  10/19/21 175 lb 12.8 oz (79.7 kg)  10/09/21 174 lb (78.9 kg)  08/13/21 174 lb (78.9 kg)    Physical Exam: BP 128/86   Pulse 71   Ht _0  (1.803 m)   Wt 175 lb 12.8 oz (79.7 kg)   SpO2 97%  BMI 24.52 kg/m  Physical Exam Vitals reviewed.  Constitutional:      General: He is not in acute distress.    Appearance: Normal appearance. He is normal weight. He is not ill-appearing or toxic-appearing.     Comments: Actually, very well healthy-appearing.  HENT:     Head: Normocephalic and atraumatic.  Neck:     Vascular: No carotid bruit.  Cardiovascular:     Rate and Rhythm: Normal rate and regular rhythm.     Pulses: Normal pulses.     Heart sounds: Normal heart sounds. No murmur heard.    No friction rub. No gallop.  Pulmonary:     Effort: Pulmonary effort is normal. No respiratory distress.     Breath sounds: No rhonchi or rales.     Comments: Mild interstitial sounds with late faint, occasional expiratory wheeze. Chest:     Chest wall: No tenderness.  Musculoskeletal:     Cervical back: Neck supple.      Adult ECG Report  Rate: 71 ;  Rhythm: normal sinus rhythm and nonspecific ST-T wave changes. ;   Narrative Interpretation: Stable   Recent Labs:   reviewed Lab Results  Component Value Date   CHOL 114 08/14/2021   HDL 37 (L) 08/14/2021   LDLCALC 53 08/14/2021   LDLDIRECT 53.0 07/05/2018   TRIG 154 (H) 08/14/2021   CHOLHDL 3.1 08/14/2021   Lab Results  Component Value Date   CREATININE 0.86 08/14/2021   BUN 14 08/14/2021   NA 139 08/14/2021   K 4.2 08/14/2021   CL 110 08/14/2021   CO2 20 08/14/2021      Latest Ref Rng & Units  08/14/2021    8:08 AM 12/23/2020    9:59 AM 05/05/2020    1:04 PM  CBC  WBC 3.8 - 10.8 Thousand/uL 11.6  13.1  12.7   Hemoglobin 13.2 - 17.1 g/dL 16.8  17.6  16.6   Hematocrit 38.5 - 50.0 % 50.5  53.1  49.2   Platelets 140 - 400 Thousand/uL 370  421  355.0     Lab Results  Component Value Date   HGBA1C 5.8 (H) 08/14/2021   Lab Results  Component Value Date   TSH 2.330 10/12/2016    ================================================== I spent a total of 16 minutes with the patient spent in direct patient consultation.  Additional time spent with chart review  / charting (studies, outside notes, etc): 16 min Total Time: 32 min  Current medicines are reviewed at length with the patient today.  (+/- concerns) none  Notice: This dictation was prepared with Dragon dictation along with smart phrase technology. Any transcriptional errors that result from this process are unintentional and may not be corrected upon review.  Studies Ordered:   Orders Placed This Encounter  Procedures   EKG 12-Lead   Meds ordered this encounter  Medications   DISCONTD: sildenafil (VIAGRA) 50 MG tablet    Sig: Take 25 to 50 mg tablet  as needed for erectile dysfunction    Dispense:  10 tablet    Refill:  6    Dr Ellyn Hack aware and  educated patient   DISCONTD: prasugrel (EFFIENT) 10 MG TABS tablet    Sig: Take 1 tablet (10 mg total) by mouth every evening.    Dispense:  90 tablet    Refill:  3   EFFIENT 10 MG TABS tablet    Sig: Take 1 tablet (10 mg total) by mouth daily.    Dispense:  90  tablet    Refill:  3    Patient Instructions / Medication Changes & Studies & Tests Ordered   Patient Instructions  Medication Instructions:   Continue all medicaitons   Can use Sildenafil 25  mg to 50 mg  1/2 to 1 tablet as needed - do not use if taken NTG tablets in 48 hours  *If you need a refill on your cardiac medications before your next appointment, please call your pharmacy*   Lab Work:  Not  needed     Testing/Procedures:  Not needed  Follow-Up: At [facility], you and your health needs are our priority.  As part of our continuing mission to provide you with exceptional heart care, we have created designated Provider Care Teams.  These Care Teams include your primary Cardiologist (physician) and Advanced Practice Providers (APPs -  Physician Assistants and Nurse Practitioners) who all work together to provide you with the care you need, when you need it.  We recommend signing up for the patient portal called "MyChart".  Sign up information is provided on this After Visit Summary.  MyChart is used to connect with patients for Virtual Visits (Telemedicine).  Patients are able to view lab/test results, encounter notes, upcoming appointments, etc.  Non-urgent messages can be sent to your provider as well.   To learn more about what you can do with MyChart, go to [email or web address].    Your next appointment:   6 month(s)  The format for your next appointment:   In Person  Provider:   Glenetta Hew, MD    Other Instructions      Leonie Man, MD, MS Glenetta Hew, M.D., M.S. Interventional Cardiologist  [place]  Pager # [phone] Phone # [phone] [address]. [place], [place] [address]   Thank you for choosing [facility]!!

## 2021-10-19 NOTE — Progress Notes (Signed)
Called pharmacy to verify they received both prescriptions. Pharmacy staff verbalized they had both prescriptions but they had to order Effient and it should be in tomorrow. Called pt to inform him Effient would be in tomorrow and he could pick it up after 12. He states she just spoke to someone and arranged the medication to be mailed to him. He requested Viagra be sent to [facility] in [place]. Prescription sent the Troy Parrish per his request. [place] back , spoke with Aaron Edelman, I let him know the Viagra was sent to another pharmacy. He seen it in the computer, they will not fill Viagra since it was sent to another pharmacy.

## 2021-10-20 ENCOUNTER — Other Ambulatory Visit (HOSPITAL_COMMUNITY): Payer: Self-pay

## 2021-10-21 ENCOUNTER — Other Ambulatory Visit (HOSPITAL_COMMUNITY): Payer: Self-pay

## 2021-10-23 ENCOUNTER — Other Ambulatory Visit (HOSPITAL_COMMUNITY): Payer: Self-pay

## 2021-11-01 ENCOUNTER — Encounter: Payer: Self-pay | Admitting: Cardiology

## 2021-11-01 NOTE — Assessment & Plan Note (Signed)
Completely asymptomatic with revascularization about 5 years ago but no ongoing symptoms.  Reduced EF but grade 1 CHF.  No recurrent angina on stable meds.  Would not recommend further evaluation if surgical procedures warranted, would simply proceed.  He would need to hold his antiplatelet agent.  Currently on Effient milligram which can be held for 9 days preop for surgeries or procedures.  Would restart when safe postop.  Based on his risks would be a moderate risk patient for what ever surgery, but further testing would not alter therapy.  Therefore, another test would not be warranted.

## 2021-11-01 NOTE — Assessment & Plan Note (Signed)
On aggressive CV risk factor modification with medications as noted in CAD section

## 2021-11-01 NOTE — Assessment & Plan Note (Signed)
Euvolemic.  No major issues.  Not requiring diuretic.  He is simply on low-dose Toprol.  If glycemic control becomes an issue, would probably consider SGLT2 inhibitor, but for now we will hold off as both glycemic control and the control seems to be stable.  In the past he has had issues with decompensation, need to monitor closely.

## 2021-11-01 NOTE — Assessment & Plan Note (Signed)
Lipids as of July looked good.  Well-controlled.  LDL is lower than it had been.  Stable at 53.  Continue rosuvastatin, fish oil and fenofibrate.  Triglycerides borderline but pretty well controlled.  Should be due for follow-up labs in about 4 months.

## 2021-11-01 NOTE — Assessment & Plan Note (Signed)
He has now had 3 STEMI is an ACS presentation involving all 3 major coronary arteries and has had PCI to all 3 major branches.  Stents are patent as of 2020.  Is doing relatively well since then. He does have mild reduced EF but no signs or symptoms of heart failure. Blood pressure actually makes it difficult to titrate guideline directed medical therapy.

## 2021-11-01 NOTE — Assessment & Plan Note (Signed)
Ischemic CM, but NYHA class I HF symptoms.  Euvolemict.  BP is actually been relatively low and therefore is only on low-dose Toprol (we did recently increase to 37.5 mg more for ectopy.  If pressures tend to drift up, would have a low threshold to restart ARB dose.  But for now we will hold off.  No diuretic requirement.

## 2021-11-01 NOTE — Assessment & Plan Note (Signed)
Done on 1-2 cigarettes a day with some days skipping days.  He seems to be followed on today than he had been in the past for smoking cessation.  Encouraged him to continue to discontinue smoking

## 2021-11-01 NOTE — Assessment & Plan Note (Signed)
Multivessel disease multivessel PCI.  Without any active angina.  He is very active tolerating his 37.5 mg Toprol daily.  No longer on nitrate.  Plan:  Continue current dose of Toprol 37.5 mg daily.  On combination of 40 mg rosuvastatin, fish oil and fenofibrate well-controlled lipids.\  Remains on maintenance dose Effient.  We talked about whether we should switch to clopidogrel, but he has had issues with catheter in the past including having significant in-stent restenosis/thrombosis.  He would prefer to stay on a more potent medication.  He had respiratory issues with Brilinta.  Okay to hold Effient 7 to 9 days preop for surgeries or procedures.

## 2021-11-01 NOTE — Assessment & Plan Note (Signed)
Not really hypertensive.  Is on modest dose Toprol.  In the past we have had to discontinue ACE inhibitor/ARB medications.

## 2021-11-01 NOTE — Assessment & Plan Note (Signed)
Not current medications.  Follow-up with PCP.

## 2021-11-01 NOTE — Assessment & Plan Note (Signed)
Not currently using CPAP.  Seems to be sleeping well.   We will review his Epworth score and follow-up visit to determine if he would continue to benefit.

## 2021-11-12 ENCOUNTER — Encounter: Payer: Self-pay | Admitting: Ophthalmology

## 2021-11-14 ENCOUNTER — Other Ambulatory Visit: Payer: Self-pay | Admitting: Cardiology

## 2021-11-17 NOTE — Discharge Instructions (Signed)

## 2021-11-19 ENCOUNTER — Encounter: Payer: Self-pay | Admitting: Ophthalmology

## 2021-11-19 ENCOUNTER — Encounter
Admission: RE | Disposition: A | Discharge status: Home / Self Care | Payer: Self-pay | Source: Home / Self Care | Attending: Ophthalmology

## 2021-11-19 ENCOUNTER — Ambulatory Visit: Payer: PPO | Admitting: General Practice

## 2021-11-19 ENCOUNTER — Ambulatory Visit
Admission: RE | Admit: 2021-11-19 | Discharge: 2021-11-19 | Disposition: A | Discharge status: Home / Self Care | Payer: PPO | Attending: Ophthalmology | Admitting: Ophthalmology

## 2021-11-19 ENCOUNTER — Other Ambulatory Visit: Payer: Self-pay

## 2021-11-19 DIAGNOSIS — I110 Hypertensive heart disease with heart failure: Secondary | ICD-10-CM | POA: Diagnosis not present

## 2021-11-19 DIAGNOSIS — J449 Chronic obstructive pulmonary disease, unspecified: Secondary | ICD-10-CM | POA: Insufficient documentation

## 2021-11-19 DIAGNOSIS — I509 Heart failure, unspecified: Secondary | ICD-10-CM | POA: Insufficient documentation

## 2021-11-19 DIAGNOSIS — I255 Ischemic cardiomyopathy: Secondary | ICD-10-CM

## 2021-11-19 DIAGNOSIS — I25119 Atherosclerotic heart disease of native coronary artery with unspecified angina pectoris: Secondary | ICD-10-CM

## 2021-11-19 DIAGNOSIS — F17210 Nicotine dependence, cigarettes, uncomplicated: Secondary | ICD-10-CM | POA: Diagnosis not present

## 2021-11-19 DIAGNOSIS — G4733 Obstructive sleep apnea (adult) (pediatric): Secondary | ICD-10-CM | POA: Diagnosis not present

## 2021-11-19 DIAGNOSIS — E118 Type 2 diabetes mellitus with unspecified complications: Secondary | ICD-10-CM

## 2021-11-19 DIAGNOSIS — I2510 Atherosclerotic heart disease of native coronary artery without angina pectoris: Secondary | ICD-10-CM | POA: Diagnosis not present

## 2021-11-19 DIAGNOSIS — I252 Old myocardial infarction: Secondary | ICD-10-CM | POA: Diagnosis not present

## 2021-11-19 DIAGNOSIS — H2512 Age-related nuclear cataract, left eye: Secondary | ICD-10-CM | POA: Diagnosis not present

## 2021-11-19 DIAGNOSIS — K219 Gastro-esophageal reflux disease without esophagitis: Secondary | ICD-10-CM | POA: Diagnosis not present

## 2021-11-19 DIAGNOSIS — Z955 Presence of coronary angioplasty implant and graft: Secondary | ICD-10-CM | POA: Insufficient documentation

## 2021-11-19 DIAGNOSIS — H25042 Posterior subcapsular polar age-related cataract, left eye: Secondary | ICD-10-CM | POA: Diagnosis not present

## 2021-11-19 HISTORY — PX: CATARACT EXTRACTION W/PHACO: SHX586

## 2021-11-19 SURGERY — PHACOEMULSIFICATION, CATARACT, WITH IOL INSERTION
Anesthesia: Monitor Anesthesia Care | Site: Eye | Laterality: Left

## 2021-11-19 MED ORDER — FENTANYL CITRATE (PF) 100 MCG/2ML IJ SOLN
INTRAMUSCULAR | Status: DC | PRN
  Administered 2021-11-19: 100 ug via INTRAVENOUS

## 2021-11-19 MED ORDER — ARMC OPHTHALMIC DILATING DROPS
1.0000 | OPHTHALMIC | Status: DC | PRN
  Administered 2021-11-19 (×3): 1 via OPHTHALMIC

## 2021-11-19 MED ORDER — SIGHTPATH DOSE#1 BSS IO SOLN
INTRAOCULAR | Status: DC | PRN
  Administered 2021-11-19 (×2): 15 mL

## 2021-11-19 MED ORDER — SIGHTPATH DOSE#1 BSS IO SOLN
INTRAOCULAR | Status: DC | PRN
  Administered 2021-11-19: 120 mL via OPHTHALMIC

## 2021-11-19 MED ORDER — MOXIFLOXACIN HCL 0.5 % OP SOLN
OPHTHALMIC | Status: DC | PRN
  Administered 2021-11-19: 0.2 mL via OPHTHALMIC

## 2021-11-19 MED ORDER — MIDAZOLAM HCL 2 MG/2ML IJ SOLN
INTRAMUSCULAR | Status: DC | PRN
  Administered 2021-11-19: 2 mg via INTRAVENOUS

## 2021-11-19 MED ORDER — LACTATED RINGERS IV SOLN: INTRAVENOUS | Status: DC

## 2021-11-19 MED ORDER — TETRACAINE HCL 0.5 % OP SOLN
1.0000 [drp] | OPHTHALMIC | Status: DC | PRN
  Administered 2021-11-19 (×3): 1 [drp] via OPHTHALMIC

## 2021-11-19 MED ORDER — LIDOCAINE HCL (PF) 2 % IJ SOLN
INTRAOCULAR | Status: DC | PRN
  Administered 2021-11-19: 1 mL via INTRAOCULAR

## 2021-11-19 MED ORDER — BRIMONIDINE TARTRATE-TIMOLOL 0.2-0.5 % OP SOLN
OPHTHALMIC | Status: DC | PRN
  Administered 2021-11-19: 1 [drp] via OPHTHALMIC

## 2021-11-19 MED ORDER — SIGHTPATH DOSE#1 NA HYALUR & NA CHOND-NA HYALUR IO KIT
PACK | INTRAOCULAR | Status: DC | PRN
  Administered 2021-11-19: 1 via OPHTHALMIC

## 2021-11-19 SURGICAL SUPPLY — 23 items
CANNULA ANT/CHMB 27G (MISCELLANEOUS) IMPLANT
CANNULA ANT/CHMB 27GA (MISCELLANEOUS) IMPLANT
CATARACT SUITE SIGHTPATH (MISCELLANEOUS) ×1 IMPLANT
DISSECTOR HYDRO NUCLEUS 50X22 (MISCELLANEOUS) ×1 IMPLANT
DRSG TEGADERM 2-3/8X2-3/4 SM (GAUZE/BANDAGES/DRESSINGS) ×1 IMPLANT
FEE CATARACT SUITE SIGHTPATH (MISCELLANEOUS) ×1 IMPLANT
GLOVE SURG SYN 7.5  E (GLOVE) ×1
GLOVE SURG SYN 7.5 E (GLOVE) ×1 IMPLANT
GLOVE SURG SYN 7.5 PF PI (GLOVE) ×1 IMPLANT
GLOVE SURG SYN 8.5  E (GLOVE) ×1
GLOVE SURG SYN 8.5 E (GLOVE) ×1 IMPLANT
GLOVE SURG SYN 8.5 PF PI (GLOVE) ×1 IMPLANT
LENS CLAREON VIVITY 21.0 ×1 IMPLANT
LENS IOL CLRN VT YLW 21.0 IMPLANT
NDL FILTER BLUNT 18X1 1/2 (NEEDLE) IMPLANT
NEEDLE FILTER BLUNT 18X1 1/2 (NEEDLE) IMPLANT
PACK VIT ANT 23G (MISCELLANEOUS) IMPLANT
RING MALYGIN (MISCELLANEOUS) IMPLANT
SUT ETHILON 10-0 CS-B-6CS-B-6 (SUTURE)
SUTURE EHLN 10-0 CS-B-6CS-B-6 (SUTURE) IMPLANT
SYR 3ML LL SCALE MARK (SYRINGE) IMPLANT
SYR 5ML LL (SYRINGE) IMPLANT
WATER STERILE IRR 250ML POUR (IV SOLUTION) ×1 IMPLANT

## 2021-11-19 NOTE — Anesthesia Preprocedure Evaluation (Addendum)
Anesthesia Evaluation  Patient identified by MRN, date of birth, ID band Patient awake    Reviewed: Allergy & Precautions, NPO status , Patient's Chart, lab work & pertinent test results  History of Anesthesia Complications Negative for: history of anesthetic complications  Airway Mallampati: IV   Neck ROM: Full    Dental  (+) Missing, Loose, Chipped   Pulmonary sleep apnea and Continuous Positive Airway Pressure Ventilation , COPD, Current Smoker and Patient abstained from smoking., PE: 10-12 cigarettes per day.   Pulmonary exam normal breath sounds clear to auscultation       Cardiovascular hypertension, + CAD (s/p MI and stents) and +CHF (EF 45%)  Normal cardiovascular exam Rhythm:Regular Rate:Normal     Neuro/Psych  Headaches, Seizures -, Well Controlled,     GI/Hepatic GERD  ,  Endo/Other  Prediabetes   Renal/GU negative Renal ROS     Musculoskeletal   Abdominal   Peds  Hematology negative hematology ROS (+)   Anesthesia Other Findings   Reproductive/Obstetrics                            Anesthesia Physical Anesthesia Plan  ASA: 3  Anesthesia Plan: MAC   Post-op Pain Management:    Induction: Intravenous  PONV Risk Score and Plan: 0 and Treatment may vary due to age or medical condition, Midazolam and TIVA  Airway Management Planned: Natural Airway and Nasal Cannula  Additional Equipment:   Intra-op Plan:   Post-operative Plan:   Informed Consent: I have reviewed the patients History and Physical, chart, labs and discussed the procedure including the risks, benefits and alternatives for the proposed anesthesia with the patient or authorized representative who has indicated his/her understanding and acceptance.     Dental advisory given  Plan Discussed with: CRNA  Anesthesia Plan Comments: (LMA/GETA backup discussed.  Patient consented for risks of anesthesia  including but not limited to:  - adverse reactions to medications - damage to eyes, teeth, lips or other oral mucosa - nerve damage due to positioning  - sore throat or hoarseness - damage to heart, brain, nerves, lungs, other parts of body or loss of life  Informed patient about role of CRNA in peri- and intra-operative care.  Patient voiced understanding.)        Anesthesia Quick Evaluation

## 2021-11-19 NOTE — Anesthesia Postprocedure Evaluation (Signed)
Anesthesia Post Note  Patient: Troy Parrish.  Procedure(s) Performed: CATARACT EXTRACTION PHACO AND INTRAOCULAR LENS PLACEMENT (IOC) LEFT vivity lens (Left: Eye)  Patient location during evaluation: PACU Anesthesia Type: MAC Level of consciousness: awake and alert, oriented and patient cooperative Pain management: pain level controlled Vital Signs Assessment: post-procedure vital signs reviewed and stable Respiratory status: spontaneous breathing, nonlabored ventilation and respiratory function stable Cardiovascular status: blood pressure returned to baseline and stable Postop Assessment: adequate PO intake Anesthetic complications: no   No notable events documented.   Last Vitals:  Vitals:   11/19/21 1100 11/19/21 1105  BP: 123/83 126/82  Pulse: 77 78  Resp: (!) 23 20  Temp: (!) 36.1 C (!) 36.2 C  SpO2: 96% 96%    Last Pain:  Vitals:   11/19/21 1105  TempSrc:   PainSc: 0-No pain                 Darrin Nipper

## 2021-11-19 NOTE — H&P (Signed)
[facility]   Primary Care Physician:  Jearld Fenton, NP Ophthalmologist: Dr. Merleen Nicely  Pre-Procedure History & Physical: HPI:  Troy Parrish. is a 61 y.o. male here for cataract surgery.   Past Medical History:  Diagnosis Date   Bronchitis    Gets bronchitis almost every year   CAD S/P percutaneous coronary angioplasty 06/2011; 6/ & 11/2013   S/P PCI to all 3 major vessels;a)  Ant STEMI 2001- BMS-LAD x2 -->(redo PCI 6/'15 -- pLAD Xience DES 2.5 x 12- 2.75 mm, mLAD 2.25 x 12 - 2.7 mm), b) '06 UA --> Cx- OM DES; c) 2013 Inf MI BMS mRCA --> d) 10/'15 PCI dRCA Promus P DES 4.0 x 16 (4.25 mm); PTCA of RPL2 (2.0 mm) &RPDA (2.25 mm) 11/2013; 4/18 PCI OM1 Synergy DES  2.5 x 16   COPD (chronic obstructive pulmonary disease) (HCC)    Elevated WBC count    Emphysema of lung ([place])    Essential hypertension 07/07/2011   Former heavy tobacco smoker     quit in May 2015 after multiple attempts at trying to quit before    GERD (gastroesophageal reflux disease)    Glucose intolerance (pre-diabetes) June 2015    hemoglobin A1c 6.6   Headache    "Imdur related; stopped taking it; headaches went away" (11/15/2013)   Heart murmur    History of colon polyps    History of stomach ulcers    Hyperlipidemia with target LDL less than 70 07/28/3557   Metabolic syndrome    Pre-diabetes, hypertension and truncal obesity as well as dyslipidemia   OSA on CPAP    Pneumonia "several times"   Recurrent upper respiratory infection (URI)    Seizures ([place]) "several"   "last one was ~ 2011" (11/15/2013)   Shortness of breath dyspnea    ST elevation myocardial infarction (STEMI) of anterior wall ([place]) 2001   History of -- 2 stents in early and distal mid LAD; prior cardiologist was Dr. Remi Haggard in [place] elevation myocardial infarction (STEMI) of inferior wall ([place]) 07/07/2011   Occluded RCA 2.75X18 INTEGRITY; Echo 07/2013: EF 45-50%, Inferior & Posterolateral HK.    Umbilical hernia     Unstable angina ([place]) 2006; 07/2013   Cx-OM - PCI 2.5 mm 13 mm Cypher DES [name], [place]) ; 07/2013: Severe ISR of both prox & Distal LAD stentS --> 2 DES stents (1 at prox edge of the proximal stent, 2nd covers the entire distal stent as well as proximal and distal edge stenose)    Upper respiratory infection     Past Surgical History:  Procedure Laterality Date   BIOPSY N/A 06/30/2020   Procedure: BIOPSY;  Surgeon: Lucilla Lame, MD;  Location: [place];  Service: Endoscopy;  Laterality: N/A;   Cardiac MRI [facility]  09/2014   EF 51%. Mod HK of basal-mid Inf wall, mild HK of basal inferoseptum & basal-mid inferolateral wall with hyper enhancement (c/w subendocard RCA MI with significant viability), focal hyper enhancement of apical wall c/w dLAD subendocard MI & complete LAD viability. . No aortic stenosis. Normal RV function. No Ischemia on Adenosine Stress.   COLONOSCOPY WITH PROPOFOL N/A 03/28/2015   Procedure: COLONOSCOPY WITH PROPOFOL;  Surgeon: Lucilla Lame, MD;  Location: [place];  Service: Endoscopy;  Laterality: N/A;   COLONOSCOPY WITH PROPOFOL N/A 06/30/2020   Procedure: COLONOSCOPY WITH PROPOFOL;  Surgeon: Lucilla Lame, MD;  Location: [place];  Service: Endoscopy;  Laterality: N/A;  CORONARY ANGIOPLASTY WITH STENT PLACEMENT  2001   ANTERIOR mi with a  PCI to LAD; [place], [place] - Dr. Dorris Fetch   CORONARY ANGIOPLASTY WITH STENT PLACEMENT  2006   [place] by Dr Dorris Fetch -lesion lft circ/OM1 with 2.5 x 20mm Cypher DES   CORONARY STENT INTERVENTION N/A 05/19/2016   Procedure: Coronary Stent Intervention;  Surgeon: Leonie Man, MD;  Location: [place] CV LAB;  Service: Cardiovascular: PCI pOM1 - Synergy DES 2.5 x 16 (overlaps old stent)   ESOPHAGOGASTRODUODENOSCOPY (EGD) WITH PROPOFOL N/A 03/28/2015   Procedure: ESOPHAGOGASTRODUODENOSCOPY (EGD) WITH PROPOFOL;  Surgeon: Lucilla Lame, MD;  Location: [place];  Service: Endoscopy;  Laterality: N/A;    ESOPHAGOGASTRODUODENOSCOPY (EGD) WITH PROPOFOL N/A 06/30/2020   Procedure: ESOPHAGOGASTRODUODENOSCOPY (EGD) WITH PROPOFOL;  Surgeon: Lucilla Lame, MD;  Location: [place];  Service: Endoscopy;  Laterality: N/A;   ESOPHAGOGASTRODUODENOSCOPY (EGD) WITH PROPOFOL N/A 09/11/2020   Procedure: ESOPHAGOGASTRODUODENOSCOPY (EGD) WITH PROPOFOL;  Surgeon: Lucilla Lame, MD;  Location: [place];  Service: Endoscopy;  Laterality: N/A;   INSERTION OF MESH N/A 04/02/2015   Procedure: INSERTION OF MESH;  Surgeon: Florene Glen, MD;  Location: [facility];  Service: General;  Laterality: N/A;   LEFT AND RIGHT HEART CATHETERIZATION WITH CORONARY ANGIOGRAM N/A 07/30/2013   Procedure: LEFT AND RIGHT HEART CATHETERIZATION WITH CORONARY ANGIOGRAM;  Surgeon: Leonie Man, MD;  Location: [facility] CATH LAB;  Service: Cardiovascular;  2 separate P & mLAD lesions -> PCI, MOd-Severe dRCA disesase with diffuse RPL/PDA disease (FFR)   LEFT HEART CATH  08/03/2013   Procedure: LEFT HEART CATH;  Surgeon: Leonie Man, MD;  Location: [facility] CATH LAB;  Service: Cardiovascular;;FFR of RCA - non-flow-limiting   LEFT HEART CATH AND CORONARY ANGIOGRAPHY N/A 05/19/2016   Procedure: Left Heart Cath and Coronary Angiography;  Surgeon: Leonie Man, MD;  Location: [place] CV LAB;  Service: Cardiovascular: CULPRIT - 85% pOM1 pre-stent. Stents in both proximal and distal RCA widely patent. PTCA sites in RPL & PDA widely patent with stable 70% small RPL 2. Extensive stenting in the LAD widely patent stents with mild to mod Dz btw prox & mid-distal stents.    LEFT HEART CATH AND CORONARY ANGIOGRAPHY N/A 02/13/2018   Procedure: LEFT HEART CATH AND CORONARY ANGIOGRAPHY;  Surgeon: Leonie Man, MD;  Location: [place] CV LAB;  Service: Cardiovascular: Similar findings to last cath post PCI.  Stents are patent.  Roughly 50% proximal LAD lesion noted, may be progression from prior cath but otherwise stable disease.   LEFT HEART  CATHETERIZATION WITH CORONARY ANGIOGRAM N/A 07/07/2011   Procedure: LEFT HEART CATHETERIZATION WITH CORONARY ANGIOGRAM;  Surgeon: Leonie Man, MD;  Location: [facility] CATH LAB;  Service: Cardiovascular;  inferolat post LV infarct ST-elevation MI   MET/CPET  11/09/2011   submax. effort 1.05 RER peak V02 was 51% ,chronotropic incomp.hrt rate lows 80s to 100   MET/CPET  September 2016   [facility]: Mild functional impairment due primarily to mild pulmonary and circulatory limitations. Also suggest physical deconditioning (seen with low-normal aerobic reserve). Mild VQ mismatch with exercise.  Ventilatory reserve exhausted with peak exercise demonstrated pulmonary limitation. No significant decrease in postexercise FEV1 compared to rest --> suggest no exercise induced bronchospasm   NM MYOVIEW ([facility] HX)  11/30/2011   EF 45% ,exercise 10 METS, infarct\scar w mild perinfarct ischemia -basal inferolat and mid inferolat region   NM MYOVIEW LTD  04/25/2016   Exercised for 9 minutes. Reached 90%  max. Heart rate. 9.4 METS. Read as a low risk study with normal EF 55-65%. -> on Review -  there does appear to be a small to moderate sized, medium severtiy reversible perfusion defect in the anterior wall - read by computer, but not noted by reader.   PERCUTANEOUS CORONARY STENT INTERVENTION (PCI-S)  07/07/2011   Procedure: PERCUTANEOUS CORONARY STENT INTERVENTION (PCI-S);  Surgeon: Leonie Man, MD;  Location: [facility] CATH LAB;  Service: Cardiovascular;;occluded RCA ,Integrity Resolute DES 2.75 x 18 mm stent - post-dilated 3.1 mm   PERCUTANEOUS CORONARY STENT INTERVENTION (PCI-S)  07/30/2013   Procedure: PERCUTANEOUS CORONARY STENT INTERVENTION (PCI-S);  Surgeon: Leonie Man, MD;  Location: [facility] CATH LAB;  Service: Cardiovascular;;Distal mid LAD: Xience Alpine DES 2.25 mm x 28 mm (overlapping proximal and distal edge of previous stent) - 2.7 mm; proximal LAD 2.5 mm x 12 mm Xience Alpine DES (2.8 mm);; RCA 60-70% stenosis planned  staged procedure   PERCUTANEOUS CORONARY STENT INTERVENTION (PCI-S) N/A 11/15/2013   Procedure: PERCUTANEOUS CORONARY STENT INTERVENTION (PCI-S);  Surgeon: Leonie Man, MD;  Location: [facility] CATH LAB;  Service: Cardiovascular;  Patent Cx & LAD Stents; PCI -dRCA Promus Premier DES 4.0 mm x 16 mm (4.25 mm) dRCA, 2.0 mm Cutting PTCA of RPL2 [place] & POBA of mRPDA   POLYPECTOMY  03/28/2015   Procedure: POLYPECTOMY;  Surgeon: Lucilla Lame, MD;  Location: [place];  Service: Endoscopy;;   POLYPECTOMY N/A 06/30/2020   Procedure: POLYPECTOMY;  Surgeon: Lucilla Lame, MD;  Location: [place];  Service: Endoscopy;  Laterality: N/A;   RIGHT HEART CATH  June 2015   Normal pressures with severely reduced cardiac output and index. (3.25/1.55)   TRANSTHORACIC ECHOCARDIOGRAM  07/31/2013; February 2017   a. LVEF 45-50. Inferior posterior hypokinesis with mild dilation. Apparent normal diastolic pressures;    UMBILICAL HERNIA REPAIR N/A 04/02/2015   Procedure: HERNIA REPAIR UMBILICAL ADULT;  Surgeon: Florene Glen, MD;  Location: [facility];  Service: General;  Laterality: N/A;    Prior to Admission medications   Medication Sig Start Date End Date Taking? Authorizing Provider  EFFIENT 10 MG TABS tablet Take 1 tablet (10 mg total) by mouth daily. 10/19/21  Yes Leonie Man, MD  fenofibrate (TRICOR) 48 MG tablet TAKE ONE TABLET BY MOUTH DAILY 04/06/21  Yes Leonie Man, MD  fluticasone [facility]) 50 MCG/ACT nasal spray Place 2 sprays into both nostrils daily. 05/15/17 11/12/21 Yes Summers, Rhonda L, PA-C  metoprolol succinate (TOPROL-XL) 25 MG 24 hr tablet TAKE 1 AND 1/2 TABLET BY MOUTH EVERY EVENING 06/01/21  Yes Leonie Man, MD  nitroGLYCERIN (NITROSTAT) 0.4 MG SL tablet Place 1 tablet (0.4 mg total) under the tongue every 5 (five) minutes as needed for chest pain. 05/08/21  Yes Leonie Man, MD  Omega-3 Fatty Acids (FISH OIL) 1000 MG CPDR Take 3 g by mouth daily. 08/17/18  Yes Leonie Man, MD  omeprazole (PRILOSEC) 40 MG capsule Take 1 capsule (40 mg total) by mouth daily. 05/01/20  Yes Leonie Man, MD  rosuvastatin (CRESTOR) 40 MG tablet TAKE ONE TABLET BY MOUTH DAILY 05/18/21  Yes Leonie Man, MD  sildenafil (VIAGRA) 50 MG tablet Take 25 to 50 mg tablet  as needed for erectile dysfunction 10/19/21  Yes Leonie Man, MD  albuterol (VENTOLIN HFA) 108 (90 Base) MCG/ACT inhaler INHALE TWO PUFFS BY MOUTH EVERY 6 HOURS AS NEEDED FOR SHORTNESS OF BREATH 08/20/21   Jearld Fenton,  NP  blood glucose meter kit and supplies Dispense based on patient and insurance preference. Use up to four times daily as directed. (FOR ICD-10 E10.9, E11.9). 12/25/20   Jearld Fenton, NP  clobetasol cream (TEMOVATE) 7.12 % Apply 1 Application topically daily as needed (eczema). 08/20/21   Jearld Fenton, NP  fluticasone-salmeterol (ADVAIR) 100-50 MCG/ACT AEPB INHALE ONE PUFF BY MOUTH TWICE A DAY - IN THE MORNING AND AT BEDTIME Patient not taking: Reported on 11/12/2021 12/23/20   Jearld Fenton, NP  ketoconazole (NIZORAL) 2 % shampoo Apply 1 Application topically 2 (two) times a week. 08/20/21   Jearld Fenton, NP  Lancets (ONETOUCH DELICA PLUS [id number]) MISC USE TO TEST BLOOD SUGAR UP TO 4 TIMES DAILY 04/24/21   Jearld Fenton, NP  Melatonin 1 MG CAPS Take 1 mg by mouth at bedtime as needed (sleep). Patient not taking: Reported on 11/12/2021    [provider]  MODERNA COVID-19 BIVAL BOOSTER 50 MCG/0.5ML injection  10/30/20   [provider]  omeprazole (PRILOSEC) 20 MG capsule TAKE 1 CAPSULE BY MOUTH DAILY 11/16/21   Leonie Man, MD  [facility] ULTRA test strip USE TO TEST BLOOD SUGAR UP TO FOUR TIMES DAILY 05/18/21   Jearld Fenton, NP    Allergies as of 09/29/2021 - Review Complete 08/13/2021  Allergen Reaction Noted   Niacin and related Hives and Itching 12/31/2013    Family History  Problem Relation Age of Onset   Coronary artery disease Father    Heart  disease Father    Stroke Father    Hypertension Father    Hyperlipidemia Mother    Diabetes Mother    Hyperlipidemia Maternal Grandmother    Diabetes Maternal Grandmother    Hyperlipidemia Maternal Grandfather    Diabetes Maternal Grandfather    Emphysema Maternal Grandfather    Heart disease Paternal Grandmother    Heart disease Paternal Grandfather    Cancer Maternal Aunt        breast    Social History   Socioeconomic History   Marital status: Married    Spouse name: Not on file   Number of children: 8   Years of education: Not on file   Highest education level: Not on file  Occupational History   Not on file  Tobacco Use   Smoking status: Every Day    Packs/day: 0.10    Years: 40.00    Total pack years: 4.00    Types: Cigarettes   Smokeless tobacco: Never   Tobacco comments:    Quit with Bupropion $RemoveBefore'150mg'GbBRirtAcBCEh$  - > unfortunately, he restarted; has cut all the way down to maybe 1 or 2 cigarettes a day, some days he goes without any.-Some days even multiple days without cigarettes.  Vaping Use   Vaping Use: Never used  Substance and Sexual Activity   Alcohol use: Yes    Comment: rare   Drug use: No   Sexual activity: Not on file  Other Topics Concern   Not on file  Social History Narrative   He is a married father of 20, grandfather of 65. He does not really get routine exercise.    He is down to 1-2 cigarettes a day-sometimes going more than 2 to 3 days without 1.. He is very seriously wanting to quit.     He has a social alcoholic beverage every now and then.       He is no longer working, medically retired essentially.  His wife  still works.  He and his wife enjoy traveling have done multiple trips and vacations over the last couple years including several trips to [place] to be near her family.      Late summer 2023-2-week trip to [place] [facility], and then drove up to the [employer] including seeing [place] and [place].  Lots of walking and  hiking.   Social Determinants of Health   Financial Resource Strain: Low Risk  (10/09/2021)   Overall Financial Resource Strain (CARDIA)    Difficulty of Paying Living Expenses: Not hard at all  Food Insecurity: No Food Insecurity (10/09/2021)   Hunger Vital Sign    Worried About Running Out of Food in the Last Year: Never true    Ran Out of Food in the Last Year: Never true  Transportation Needs: No Transportation Needs (10/09/2021)   PRAPARE - Hydrologist (Medical): No    Lack of Transportation (Non-Medical): No  Physical Activity: Insufficiently Active (10/09/2021)   Exercise Vital Sign    Days of Exercise per Week: 2 days    Minutes of Exercise per Session: 20 min  Stress: No Stress Concern Present (10/09/2021)   [place]    Feeling of Stress : Not at all  Social Connections: Moderately Integrated (10/09/2021)   Social Connection and Isolation Panel [NHANES]    Frequency of Communication with Friends and Family: More than three times a week    Frequency of Social Gatherings with Friends and Family: Once a week    Attends Religious Services: 1 to 4 times per year    Active Member of [employer] or Organizations: No    Attends Archivist Meetings: Never    Marital Status: Married  Human resources officer Violence: Not At Risk (10/09/2021)   Humiliation, Afraid, Rape, and Kick questionnaire    Fear of Current or Ex-Partner: No    Emotionally Abused: No    Physically Abused: No    Sexually Abused: No    Review of Systems: See HPI, otherwise negative ROS  Physical Exam: Ht $Remove'5\' 11"'VQRYhbh$  (1.803 m)   Wt 76.2 kg   BMI 23.43 kg/m  General:   Alert, cooperative in NAD Head:  Normocephalic and atraumatic. Respiratory:  Normal work of breathing. Cardiovascular:  RRR  Impression/Plan: Troy Floro. is here for cataract surgery.  Risks, benefits, limitations, and alternatives regarding cataract  surgery have been reviewed with the patient.  Questions have been answered.  All parties agreeable.   Troy Richards, MD  11/19/2021, 7:13 AM

## 2021-11-19 NOTE — Op Note (Signed)
OPERATIVE NOTE  Troy Parrish [id number] 11/19/2021   PREOPERATIVE DIAGNOSIS: Nuclear sclerotic cataract left eye. H25.12   POSTOPERATIVE DIAGNOSIS: Nuclear sclerotic cataract left eye. H25.12   PROCEDURE:  Phacoemusification with posterior chamber intraocular lens placement of the left eye  Ultrasound time: Procedure(s) with comments: CATARACT EXTRACTION PHACO AND INTRAOCULAR LENS PLACEMENT (IOC) LEFT vivity lens (Left) - 11.32 1:32.6  LENS:   Implant Name Type Inv. Item Serial No. Manufacturer Lot No. LRB No. Used Action  LENS CLAREON VIVITY 21.0 - [id number]  LENS CLAREON VIVITY 21.0 [id number] S[employer]  Left 1 Implanted      SURGEON:  Courtney Heys. Lazarus Salines, MD   ANESTHESIA:  Topical with tetracaine drops, augmented with 1% preservative-free intracameral lidocaine.   COMPLICATIONS:  None.   DESCRIPTION OF PROCEDURE:  The patient was identified in the holding room and transported to the operating room and placed in the supine position under the operating microscope.  The left eye was identified as the operative eye, which was prepped and draped in the usual sterile ophthalmic fashion.   A 1 millimeter clear-corneal paracentesis was made inferotemporally. Preservative-free 1% lidocaine mixed with 1:1,000 bisulfite-free aqueous solution of epinephrine was injected into the anterior chamber. The anterior chamber was then filled with Viscoat viscoelastic. A 2.4 millimeter keratome was used to make a clear-corneal incision superotemporally. A curvilinear capsulorrhexis was made with a cystotome and capsulorrhexis forceps. Balanced salt solution was used to hydrodissect and hydrodelineate the nucleus. Phacoemulsification was then used to remove the lens nucleus and epinucleus. The remaining cortex was then removed using the irrigation and aspiration handpiece. Provisc was then placed into the capsular bag to distend it for lens placement. A +21.00 D [id number] intraocular lens was then  injected into the capsular bag. The remaining viscoelastic was aspirated.   Wounds were hydrated with balanced salt solution.  The anterior chamber was inflated to a physiologic pressure with balanced salt solution.  No wound leaks were noted. Vigamox was injected intracamerally.  Timolol and Brimonidine drops were applied to the eye.  The patient was taken to the recovery room in stable condition without complications of anesthesia or surgery.  Troy Parrish 11/19/2021, 10:58 AM

## 2021-11-19 NOTE — Transfer of Care (Signed)
Immediate Anesthesia Transfer of Care Note  Patient: Troy Parrish.  Procedure(s) Performed: CATARACT EXTRACTION PHACO AND INTRAOCULAR LENS PLACEMENT (IOC) LEFT vivity lens (Left: Eye)  Patient Location: PACU  Anesthesia Type: MAC  Level of Consciousness: awake, alert  and patient cooperative  Airway and Oxygen Therapy: Patient Spontanous Breathing and Patient connected to supplemental oxygen  Post-op Assessment: Post-op Vital signs reviewed, Patient's Cardiovascular Status Stable, Respiratory Function Stable, Patent Airway and No signs of Nausea or vomiting  Post-op Vital Signs: Reviewed and stable  Complications: No notable events documented.

## 2021-11-23 ENCOUNTER — Ambulatory Visit (INDEPENDENT_AMBULATORY_CARE_PROVIDER_SITE_OTHER): Payer: PPO

## 2021-11-23 DIAGNOSIS — Z23 Encounter for immunization: Secondary | ICD-10-CM | POA: Diagnosis not present

## 2021-11-24 ENCOUNTER — Other Ambulatory Visit (HOSPITAL_COMMUNITY): Payer: Self-pay

## 2021-12-04 ENCOUNTER — Other Ambulatory Visit: Payer: Self-pay | Admitting: Internal Medicine

## 2021-12-04 DIAGNOSIS — I5042 Chronic combined systolic (congestive) and diastolic (congestive) heart failure: Secondary | ICD-10-CM

## 2021-12-04 NOTE — Telephone Encounter (Signed)
Requested Prescriptions  Pending Prescriptions Disp Refills  . fluticasone-salmeterol (ADVAIR) 100-50 MCG/ACT AEPB [Pharmacy Med Name: FLUTICASONE-SALMETEROL 100-50] 180 each 0    Sig: INHALE ONE PUFF BY MOUTH EVERY MORNING AND EVERY NIGHT AT BEDTIME     Pulmonology:  Combination Products Passed - 12/04/2021  6:21 AM      Passed - Valid encounter within last 12 months    Recent Outpatient Visits          3 months ago Encounter for general adult medical examination with abnormal findings   [facility], Coralie Keens, NP   11 months ago Type 2 diabetes mellitus with complication [facility])   [facility], Coralie Keens, NP      Future Appointments            In 4 months Ellyn Hack Leonie Green, MD [facility] [place] [place]

## 2021-12-24 ENCOUNTER — Ambulatory Visit (INDEPENDENT_AMBULATORY_CARE_PROVIDER_SITE_OTHER): Payer: PPO | Admitting: Internal Medicine

## 2021-12-24 ENCOUNTER — Encounter: Payer: Self-pay | Admitting: Internal Medicine

## 2021-12-24 VITALS — BP 130/86 | HR 91 | Temp 96.8°F | Wt 178.0 lb

## 2021-12-24 DIAGNOSIS — J069 Acute upper respiratory infection, unspecified: Secondary | ICD-10-CM

## 2021-12-24 DIAGNOSIS — H6121 Impacted cerumen, right ear: Secondary | ICD-10-CM | POA: Diagnosis not present

## 2021-12-24 LAB — POC COVID19 BINAXNOW: SARS Coronavirus 2 Ag: NEGATIVE

## 2021-12-24 LAB — POCT INFLUENZA A/B
Influenza A, POC: NEGATIVE
Influenza B, POC: NEGATIVE

## 2021-12-24 MED ORDER — PROMETHAZINE-DM 6.25-15 MG/5ML PO SYRP: 5.0000 mL | ORAL_SOLUTION | Freq: Four times a day (QID) | ORAL | 0 refills | Status: DC | PRN

## 2021-12-24 MED ORDER — METHYLPREDNISOLONE ACETATE 80 MG/ML IJ SUSP
80.0000 mg | Freq: Once | INTRAMUSCULAR | Status: AC
  Administered 2021-12-24: 80 mg via INTRAMUSCULAR

## 2021-12-24 NOTE — Addendum Note (Signed)
Addended by: Ashley Royalty E on: 12/24/2021 04:21 PM   Modules accepted: Orders

## 2021-12-24 NOTE — Progress Notes (Signed)
Subjective:    Patient ID: Troy Floro., male    DOB: 1960-06-04, 61 y.o.   MRN: [id number]  HPI  Patient presents to clinic today with complaint of a  headache, runny nose, sore throat, cough and chest congestion. This started yesterday.  He is blowing clear mucus out of his nose.  He denies difficulty swallowing.  The cough is non productive.  He denies nasal congestion, ear pain, shortness of breath, chest pain, nausea, vomiting or diarrhea.  He denies fever, chills or body aches.  He has tried cold and flu medication and Ibuprofen OTC with some relief of symptoms. He has a history of COPD managed on Advair and Albuterol. He has not taken a home COVID test.  He has had sick contacts with similar symptoms.  He also reports cerumen impaction of his right ear.  He would like this cleaned out today.  Review of Systems     Past Medical History:  Diagnosis Date   Bronchitis    Gets bronchitis almost every year   CAD S/P percutaneous coronary angioplasty 06/2011; 6/ & 11/2013   S/P PCI to all 3 major vessels;a)  Ant STEMI 2001- BMS-LAD x2 -->(redo PCI 6/'15 -- pLAD Xience DES 2.5 x 12- 2.75 mm, mLAD 2.25 x 12 - 2.7 mm), b) '06 UA --> Cx- OM DES; c) 2013 Inf MI BMS mRCA --> d) 10/'15 PCI dRCA Promus P DES 4.0 x 16 (4.25 mm); PTCA of RPL2 (2.0 mm) &RPDA (2.25 mm) 11/2013; 4/18 PCI OM1 Synergy DES  2.5 x 16   COPD (chronic obstructive pulmonary disease) (HCC)    Elevated WBC count    Emphysema of lung ([place])    Essential hypertension 07/07/2011   Former heavy tobacco smoker     quit in May 2015 after multiple attempts at trying to quit before    GERD (gastroesophageal reflux disease)    Glucose intolerance (pre-diabetes) June 2015    hemoglobin A1c 6.6   Headache    "Imdur related; stopped taking it; headaches went away" (11/15/2013)   Heart murmur    History of colon polyps    History of stomach ulcers    Hyperlipidemia with target LDL less than 70 4/49/2010   Metabolic syndrome     Pre-diabetes, hypertension and truncal obesity as well as dyslipidemia   OSA on CPAP    Pneumonia "several times"   Recurrent upper respiratory infection (URI)    Seizures ([place]) "several"   "last one was ~ 2011" (11/15/2013)   Shortness of breath dyspnea    ST elevation myocardial infarction (STEMI) of anterior wall ([place]) 2001   History of -- 2 stents in early and distal mid LAD; prior cardiologist was Dr. Remi Haggard in [place] elevation myocardial infarction (STEMI) of inferior wall ([place]) 07/07/2011   Occluded RCA 2.75X18 INTEGRITY; Echo 07/2013: EF 45-50%, Inferior & Posterolateral HK.    Umbilical hernia    Unstable angina ([place]) 2006; 07/2013   Cx-OM - PCI 2.5 mm 13 mm Cypher DES [name], [place]) ; 07/2013: Severe ISR of both prox & Distal LAD stentS --> 2 DES stents (1 at prox edge of the proximal stent, 2nd covers the entire distal stent as well as proximal and distal edge stenose)    Upper respiratory infection     Current Outpatient Medications  Medication Sig Dispense Refill   albuterol (VENTOLIN HFA) 108 (90 Base) MCG/ACT inhaler INHALE TWO PUFFS BY MOUTH EVERY 6 HOURS AS NEEDED FOR  SHORTNESS OF BREATH 25.5 g 0   blood glucose meter kit and supplies Dispense based on patient and insurance preference. Use up to four times daily as directed. (FOR ICD-10 E10.9, E11.9). 1 each 0   clobetasol cream (TEMOVATE) 8.45 % Apply 1 Application topically daily as needed (eczema). 30 g 0   EFFIENT 10 MG TABS tablet Take 1 tablet (10 mg total) by mouth daily. 90 tablet 3   fenofibrate (TRICOR) 48 MG tablet TAKE ONE TABLET BY MOUTH DAILY 90 tablet 3   fluticasone (FLONASE) 50 MCG/ACT nasal spray Place 2 sprays into both nostrils daily. 16 g 0   fluticasone-salmeterol (ADVAIR) 100-50 MCG/ACT AEPB INHALE ONE PUFF BY MOUTH EVERY MORNING AND EVERY NIGHT AT BEDTIME 180 each 0   ketoconazole (NIZORAL) 2 % shampoo Apply 1 Application topically 2 (two) times a week. 120 mL 0   Lancets (ONETOUCH  DELICA PLUS [id number]) MISC USE TO TEST BLOOD SUGAR UP TO 4 TIMES DAILY 100 each 3   Melatonin 1 MG CAPS Take 1 mg by mouth at bedtime as needed (sleep). (Patient not taking: Reported on 11/12/2021)     metoprolol succinate (TOPROL-XL) 25 MG 24 hr tablet TAKE 1 AND 1/2 TABLET BY MOUTH EVERY EVENING 135 tablet 2   MODERNA COVID-19 BIVAL BOOSTER 50 MCG/0.5ML injection      nitroGLYCERIN (NITROSTAT) 0.4 MG SL tablet Place 1 tablet (0.4 mg total) under the tongue every 5 (five) minutes as needed for chest pain. 25 tablet 11   Omega-3 Fatty Acids (FISH OIL) 1000 MG CPDR Take 3 g by mouth daily. 30 capsule 11   omeprazole (PRILOSEC) 20 MG capsule TAKE 1 CAPSULE BY MOUTH DAILY 90 capsule 0   omeprazole (PRILOSEC) 40 MG capsule Take 1 capsule (40 mg total) by mouth daily. 90 capsule 3   ONETOUCH ULTRA test strip USE TO TEST BLOOD SUGAR UP TO FOUR TIMES DAILY 200 strip 1   rosuvastatin (CRESTOR) 40 MG tablet TAKE ONE TABLET BY MOUTH DAILY 90 tablet 1   sildenafil (VIAGRA) 50 MG tablet Take 25 to 50 mg tablet  as needed for erectile dysfunction 10 tablet 6   No current facility-administered medications for this visit.    Allergies  Allergen Reactions   Niacin And Related Hives and Itching    Family History  Problem Relation Age of Onset   Coronary artery disease Father    Heart disease Father    Stroke Father    Hypertension Father    Hyperlipidemia Mother    Diabetes Mother    Hyperlipidemia Maternal Grandmother    Diabetes Maternal Grandmother    Hyperlipidemia Maternal Grandfather    Diabetes Maternal Grandfather    Emphysema Maternal Grandfather    Heart disease Paternal Grandmother    Heart disease Paternal Grandfather    Cancer Maternal Aunt        breast    Social History   Socioeconomic History   Marital status: Married    Spouse name: Not on file   Number of children: 8   Years of education: Not on file   Highest education level: Not on file  Occupational History   Not  on file  Tobacco Use   Smoking status: Every Day    Packs/day: 0.10    Years: 40.00    Total pack years: 4.00    Types: Cigarettes   Smokeless tobacco: Never   Tobacco comments:    Quit with Bupropion 153m - > unfortunately, he restarted; has cut  all the way down to maybe 1 or 2 cigarettes a day, some days he goes without any.-Some days even multiple days without cigarettes.  Vaping Use   Vaping Use: Never used  Substance and Sexual Activity   Alcohol use: Yes    Comment: rare   Drug use: No   Sexual activity: Not on file  Other Topics Concern   Not on file  Social History Narrative   He is a married father of 37, grandfather of 32. He does not really get routine exercise.    He is down to 1-2 cigarettes a day-sometimes going more than 2 to 3 days without 1.. He is very seriously wanting to quit.     He has a social alcoholic beverage every now and then.       He is no longer working, medically retired essentially.  His wife still works.  He and his wife enjoy traveling have done multiple trips and vacations over the last couple years including several trips to [place] to be near her family.      Late summer 2023-2-week trip to [place] [facility], and then drove up to the [employer] including seeing [place] and [place].  Lots of walking and hiking.   Social Determinants of Health   Financial Resource Strain: Low Risk  (10/09/2021)   Overall Financial Resource Strain (CARDIA)    Difficulty of Paying Living Expenses: Not hard at all  Food Insecurity: No Food Insecurity (10/09/2021)   Hunger Vital Sign    Worried About Running Out of Food in the Last Year: Never true    Ran Out of Food in the Last Year: Never true  Transportation Needs: No Transportation Needs (10/09/2021)   PRAPARE - Hydrologist (Medical): No    Lack of Transportation (Non-Medical): No  Physical Activity: Insufficiently Active (10/09/2021)   Exercise Vital Sign     Days of Exercise per Week: 2 days    Minutes of Exercise per Session: 20 min  Stress: No Stress Concern Present (10/09/2021)   [place]    Feeling of Stress : Not at all  Social Connections: Moderately Integrated (10/09/2021)   Social Connection and Isolation Panel [NHANES]    Frequency of Communication with Friends and Family: More than three times a week    Frequency of Social Gatherings with Friends and Family: Once a week    Attends Religious Services: 1 to 4 times per year    Active Member of [employer] or Organizations: No    Attends Archivist Meetings: Never    Marital Status: Married  Human resources officer Violence: Not At Risk (10/09/2021)   Humiliation, Afraid, Rape, and Kick questionnaire    Fear of Current or Ex-Partner: No    Emotionally Abused: No    Physically Abused: No    Sexually Abused: No     Constitutional: Patient reports headache.  Denies fever, malaise, fatigue, or abrupt weight changes.  HEENT: Patient reports runny nose, right ear cerumen impaction and sore throat.  Denies eye pain, eye redness, ear pain, ringing in the ears, nasal congestion, bloody nose. Respiratory: Pt reports cough. Denies difficulty breathing, shortness of breath, or sputum production.   Cardiovascular: Denies chest pain, chest tightness, palpitations or swelling in the hands or feet.  Gastrointestinal: Denies abdominal pain, bloating, constipation, diarrhea or blood in the stool.   No other specific complaints in a complete review of  systems (except as listed in HPI above).  Objective:   Physical Exam   BP 130/86 (BP Location: Right Arm, Patient Position: Sitting, Cuff Size: Normal)   Pulse 91   Temp (!) 96.8 F (36 C) (Temporal)   Wt 178 lb (80.7 kg)   SpO2 99%   BMI 24.83 kg/m   Wt Readings from Last 3 Encounters:  11/19/21 176 lb (79.8 kg)  10/19/21 175 lb 12.8 oz (79.7 kg)  10/09/21 174 lb (78.9 kg)     General: Appears his stated age, well developed, well nourished in NAD. Skin: Warm, dry and intact. No rashes noted. HEENT: Head: normal shape and size, no sinus tenderness noted; Eyes: sclera white, no icterus, conjunctiva pink, PERRLA and EOMs intact; Right Ear: Cerumen impaction; Throat/Mouth: Teeth present, mucosa erythematous and moist, + PND, no exudate, lesions or ulcerations noted.  Neck: No adenopathy noted. Cardiovascular: Normal rate and rhythm. Pulmonary/Chest: Normal effort and positive vesicular breath sounds. No respiratory distress. No wheezes, rales or ronchi noted.   Neurological: Alert and oriented.   BMET    Component Value Date/Time   NA 139 08/14/2021 0808   NA 139 11/15/2019 1010   NA 139 10/24/2013 1050   K 4.2 08/14/2021 0808   K 3.8 10/24/2013 1050   CL 110 08/14/2021 0808   CL 107 10/24/2013 1050   CO2 20 08/14/2021 0808   CO2 24 10/24/2013 1050   GLUCOSE 125 (H) 08/14/2021 0808   GLUCOSE 108 (H) 10/24/2013 1050   BUN 14 08/14/2021 0808   BUN 16 11/15/2019 1010   BUN 8 10/24/2013 1050   CREATININE 0.86 08/14/2021 0808   CALCIUM 9.0 08/14/2021 0808   CALCIUM 8.2 (L) 10/24/2013 1050   GFRNONAA 70 11/15/2019 1010   GFRNONAA >60 10/24/2013 1050   GFRAA 81 11/15/2019 1010   GFRAA >60 10/24/2013 1050    Lipid Panel     Component Value Date/Time   CHOL 114 08/14/2021 0808   CHOL 141 11/15/2019 1010   TRIG 154 (H) 08/14/2021 0808   HDL 37 (L) 08/14/2021 0808   HDL 32 (L) 11/15/2019 1010   CHOLHDL 3.1 08/14/2021 0808   VLDL 18.2 05/05/2020 1304   LDLCALC 53 08/14/2021 0808    CBC    Component Value Date/Time   WBC 11.6 (H) 08/14/2021 0808   RBC 5.58 08/14/2021 0808   HGB 16.8 08/14/2021 0808   HGB 17.7 11/15/2019 1010   HCT 50.5 (H) 08/14/2021 0808   HCT 51.9 (H) 11/15/2019 1010   PLT 370 08/14/2021 0808   PLT 391 11/15/2019 1010   MCV 90.5 08/14/2021 0808   MCV 88 11/15/2019 1010   MCV 89 10/24/2013 1050   MCH 30.1 08/14/2021 0808    MCHC 33.3 08/14/2021 0808   RDW 14.1 08/14/2021 0808   RDW 13.0 11/15/2019 1010   RDW 14.5 10/24/2013 1050   LYMPHSABS 4.6 (H) 04/04/2015 1040   LYMPHSABS 2.8 11/07/2013 1509   MONOABS 0.9 04/04/2015 1040   EOSABS 0.1 04/04/2015 1040   EOSABS 0.2 11/07/2013 1509   BASOSABS 0.1 04/04/2015 1040   BASOSABS 0.1 11/07/2013 1509    Hgb A1C Lab Results  Component Value Date   HGBA1C 5.8 (H) 08/14/2021           Assessment & Plan:   Viral URI with Cough:  Encourage rest and fluids 80 mg Depo Medrol IM x1, monitor sugars Continue inhalers as previously prescribed Rx for promethazine DM cough syrup Rapid flu negative Rapid COVID-negative No  indication for antibiotics at this time  Right Ear Cerumen Impaction:  Manual lavage by CMA Advised him to try Debrox 2 times weekly to prevent wax buildup  RTC in 1 month for follow-up of chronic conditions Webb Silversmith, NP

## 2021-12-24 NOTE — Patient Instructions (Signed)
Ear Irrigation Ear irrigation is a procedure to wash dirt and wax out of your ear canal. This procedure is also called lavage. You may need ear irrigation if you are having trouble hearing because of a buildup of earwax. You may also have ear irrigation as part of the treatment for an ear infection. Getting wax and dirt out of your ear canal can help ear drops work better. Tell a health care provider about: Any allergies you have. All medicines you are taking, including vitamins, herbs, eye drops, creams, and over-the-counter medicines. Any problems you or family members have had with anesthetic medicines. Any blood disorders you have. Any surgeries you have had. This includes any ear surgeries. Any medical conditions you have. Whether you are pregnant or may be pregnant. What are the risks? Generally, this is a safe procedure. However, problems may occur, including: Infection. Pain. Hearing loss. Fluid and debris being pushed through the eardrum and into the middle ear. This can occur if there are holes in the eardrum. Ear irrigation failing to work. What happens before the procedure? You will talk with your provider about the procedure and plan. You may be given ear drops to put in your ear 15-20 minutes before irrigation. This helps loosen the wax. What happens during the procedure?  A syringe is filled with water or saline solution, which is made of salt and water. The syringe is gently inserted into the ear canal. The fluid is used to flush out wax and other debris. The procedure may vary among health care providers and hospitals. What can I expect after the procedure? After an ear irrigation, follow instructions given to you by your health care provider. Follow these instructions at home: Using ear irrigation kits Ear irrigation kits are available for use at home. Ask your health care provider if this is an option for you. In general, you should: Use a home irrigation kit only as  told by your health care provider. Read the package instructions carefully. Follow the directions for using the syringe. Use water that is room temperature. Do not do ear irrigation at home if you: Have diabetes. Diabetes increases the risk of infection. Have a hole or tear in your eardrum. Have tubes in your ears. Have had any ear surgery in the past. Have been told not to irrigate your ears. Cleaning your ears  Clean the outside of your ear with a soft washcloth daily. If told by your health care provider, use a few drops of baby oil, mineral oil, glycerin, hydrogen peroxide, or over-the-counter earwax softening drops. Do not use cotton swabs to clean your ears. These can push wax down into the ear canal. Do not put anything into your ears to try to remove wax. This includes ear candles. General instructions Take over-the-counter and prescription medicines only as told by your health care provider. If you were prescribed an antibiotic medicine, use it as told by your health care provider. Do not stop using the antibiotic even if your condition improves. Keep the ear clean and dry by following the instructions from your health care provider. Keep all follow-up visits. This is important. Visit your health care provider at least once a year to have your ears and hearing checked. Contact a health care provider if: Your hearing is not improving or is getting worse. You have pain or redness in your ear. You are dizzy. You have ringing in your ears. You have nausea or vomiting. You have fluid, blood, or pus coming out  of your ear. Summary Ear irrigation is a procedure to wash dirt and wax out of your ear canal. This procedure is also called lavage. To perform ear irrigation, ear drops may be put in your ear 15-20 minutes before irrigation. Water or saline solution will be used to flush out earwax and other debris. You may be able to irrigate your ears at home. Ask your health care provider  if this is an option for you. Follow your health care provider's instructions. Clean your ears with a soft cloth after irrigation. Do not use cotton swabs to clean your ears. These can push wax down into the ear canal. This information is not intended to replace advice given to you by your health care provider. Make sure you discuss any questions you have with your health care provider. Document Revised: 05/15/2019 Document Reviewed: 05/15/2019 Elsevier Patient Education  2023 Elsevier Inc.  

## 2022-01-22 ENCOUNTER — Other Ambulatory Visit (HOSPITAL_COMMUNITY): Payer: Self-pay

## 2022-01-25 ENCOUNTER — Other Ambulatory Visit: Payer: Self-pay | Admitting: Internal Medicine

## 2022-01-26 ENCOUNTER — Emergency Department (HOSPITAL_COMMUNITY)
Admission: EM | Admit: 2022-01-26 | Discharge: 2022-01-27 | Disposition: A | Discharge status: Home / Self Care | Payer: PPO | Attending: Emergency Medicine | Admitting: Emergency Medicine

## 2022-01-26 ENCOUNTER — Other Ambulatory Visit: Payer: Self-pay

## 2022-01-26 ENCOUNTER — Telehealth: Payer: Self-pay | Admitting: Internal Medicine

## 2022-01-26 ENCOUNTER — Other Ambulatory Visit: Payer: Self-pay | Admitting: Internal Medicine

## 2022-01-26 ENCOUNTER — Encounter (HOSPITAL_COMMUNITY): Payer: Self-pay

## 2022-01-26 DIAGNOSIS — D72829 Elevated white blood cell count, unspecified: Secondary | ICD-10-CM | POA: Diagnosis not present

## 2022-01-26 DIAGNOSIS — R079 Chest pain, unspecified: Secondary | ICD-10-CM | POA: Diagnosis not present

## 2022-01-26 DIAGNOSIS — J449 Chronic obstructive pulmonary disease, unspecified: Secondary | ICD-10-CM | POA: Insufficient documentation

## 2022-01-26 DIAGNOSIS — Z1152 Encounter for screening for COVID-19: Secondary | ICD-10-CM | POA: Diagnosis not present

## 2022-01-26 DIAGNOSIS — N289 Disorder of kidney and ureter, unspecified: Secondary | ICD-10-CM | POA: Diagnosis not present

## 2022-01-26 DIAGNOSIS — J189 Pneumonia, unspecified organism: Secondary | ICD-10-CM | POA: Diagnosis not present

## 2022-01-26 DIAGNOSIS — E860 Dehydration: Secondary | ICD-10-CM | POA: Diagnosis not present

## 2022-01-26 DIAGNOSIS — Z79899 Other long term (current) drug therapy: Secondary | ICD-10-CM | POA: Diagnosis not present

## 2022-01-26 DIAGNOSIS — R531 Weakness: Secondary | ICD-10-CM | POA: Diagnosis not present

## 2022-01-26 DIAGNOSIS — R059 Cough, unspecified: Secondary | ICD-10-CM | POA: Diagnosis not present

## 2022-01-26 DIAGNOSIS — I2510 Atherosclerotic heart disease of native coronary artery without angina pectoris: Secondary | ICD-10-CM | POA: Diagnosis not present

## 2022-01-26 LAB — CBC
HCT: 51.4 % (ref 39.0–52.0)
Hemoglobin: 17.1 g/dL — ABNORMAL HIGH (ref 13.0–17.0)
MCH: 29.6 pg (ref 26.0–34.0)
MCHC: 33.3 g/dL (ref 30.0–36.0)
MCV: 88.9 fL (ref 80.0–100.0)
Platelets: 363 10*3/uL (ref 150–400)
RBC: 5.78 MIL/uL (ref 4.22–5.81)
RDW: 14.8 % (ref 11.5–15.5)
WBC: 15.4 10*3/uL — ABNORMAL HIGH (ref 4.0–10.5)
nRBC: 0 % (ref 0.0–0.2)

## 2022-01-26 LAB — URINALYSIS, ROUTINE W REFLEX MICROSCOPIC
Bilirubin Urine: NEGATIVE
Glucose, UA: NEGATIVE mg/dL
Hgb urine dipstick: NEGATIVE
Ketones, ur: NEGATIVE mg/dL
Leukocytes,Ua: NEGATIVE
Nitrite: NEGATIVE
Protein, ur: >=300 mg/dL — AB
Specific Gravity, Urine: 1.016 (ref 1.005–1.030)
pH: 5 (ref 5.0–8.0)

## 2022-01-26 LAB — BASIC METABOLIC PANEL
Anion gap: 12 (ref 5–15)
BUN: 13 mg/dL (ref 8–23)
CO2: 21 mmol/L — ABNORMAL LOW (ref 22–32)
Calcium: 9.6 mg/dL (ref 8.9–10.3)
Chloride: 103 mmol/L (ref 98–111)
Creatinine, Ser: 1.68 mg/dL — ABNORMAL HIGH (ref 0.61–1.24)
GFR, Estimated: 46 mL/min — ABNORMAL LOW (ref >60)
Glucose, Bld: 129 mg/dL — ABNORMAL HIGH (ref 70–99)
Potassium: 4 mmol/L (ref 3.5–5.1)
Sodium: 136 mmol/L (ref 135–145)

## 2022-01-26 LAB — TROPONIN I (HIGH SENSITIVITY): Troponin I (High Sensitivity): 5 ng/L (ref <18)

## 2022-01-26 MED ORDER — CLOBETASOL PROPIONATE 0.05 % EX CREA: 1.0000 | TOPICAL_CREAM | Freq: Every day | CUTANEOUS | 0 refills | Status: AC | PRN

## 2022-01-26 MED ORDER — KETOCONAZOLE 2 % EX SHAM: 1.0000 | MEDICATED_SHAMPOO | CUTANEOUS | 0 refills | Status: AC

## 2022-01-26 MED ORDER — ALBUTEROL SULFATE HFA 108 (90 BASE) MCG/ACT IN AERS: INHALATION_SPRAY | RESPIRATORY_TRACT | 0 refills | Status: DC

## 2022-01-26 NOTE — ED Provider Triage Note (Signed)
Emergency Medicine Provider Triage Evaluation Note  Troy Parrish. , a 61 y.o. male  was evaluated in triage.  Patient complaining of lightheadedness and "tingling."  He reports that he was on the phone with a friend when all of a sudden he started to feel strange.  He says that he has a history of multiple MIs so he is very in tune with his body however he was not complaining of pain and says that this does not feel anything like previous heart attacks.  He felt presyncopal and like he was having trouble with his awareness but he said it only lasted a couple minutes.  He took his blood pressure in this time.  And they were all low, systolics in the 41U-38G.  He called his PCP and they told him to increase his fluid intake and continue to monitor and with any other drops in his BP he should go to the emergency department.  Review of Systems  Positive:  Negative:   Physical Exam  BP 103/68 (BP Location: Right Arm)   Pulse (!) 105   Temp 98.5 F (36.9 C)   Resp (!) 28   Ht '5\' 11"'$  (1.803 m)   Wt 81.6 kg   SpO2 97%   BMI 25.10 kg/m  Gen:   Awake, no distress   Resp:  Normal effort  MSK:   Moves extremities without difficulty  Other:  Cranial nerves II through XII grossly intact.  No problem with finger-nose testing.  PERRLA.  Normal strength in bilateral upper and lower extremities.  No facial droop, slurred speech or other abnormalities on neuroexam.  Regular rhythm, mildly tachycardic in the 100s.  Lung sounds clear  Medical Decision Making  Medically screening exam initiated at 10:30 PM.  Appropriate orders placed.  Troy Parrish. was informed that the remainder of the evaluation will be completed by another provider, this initial triage assessment does not replace that evaluation, and the importance of remaining in the ED until their evaluation is complete.     Rhae Hammock, PA-C 01/26/22 2232

## 2022-01-26 NOTE — Telephone Encounter (Signed)
Requested Prescriptions  Pending Prescriptions Disp Refills   clobetasol cream (TEMOVATE) 0.05 % [Pharmacy Med Name: CLOBETASOL 0.05% CREAM] 30 g     Sig: APPLY TOPICALLY DAILY AS NEEDED (ECZEMA)     Not Delegated - Dermatology:  Corticosteroids Failed - 01/26/2022  9:31 AM      Failed - This refill cannot be delegated      Passed - Valid encounter within last 12 months    Recent Outpatient Visits           1 month ago Viral URI with cough   [place], NP   5 months ago Encounter for general adult medical examination with abnormal findings   [facility] [place], Coralie Keens, NP   1 year ago Type 2 diabetes mellitus with complication [facility])   [facility], Coralie Keens, NP       Future Appointments             In 3 months Ellyn Hack Leonie Green, MD [place] [facility] Dept Of [place]. [facility]             ketoconazole (NIZORAL) 2 % shampoo [Pharmacy Med Name: KETOCONAZOLE 2% SHAMPOO] 120 mL     Sig: APPLY TOPICALLY TWICE WEEKLY     Not Delegated - Over the Counter: OTC 2 Failed - 01/26/2022  9:31 AM      Failed - This refill cannot be delegated      Passed - Valid encounter within last 12 months    Recent Outpatient Visits           1 month ago Viral URI with cough   [facility] [place], Coralie Keens, NP   5 months ago Encounter for general adult medical examination with abnormal findings   [facility] [place], Coralie Keens, NP   1 year ago Type 2 diabetes mellitus with complication [facility])   [facility] [place], Coralie Keens, NP       Future Appointments             In 3 months Ellyn Hack Leonie Green, MD [place] [facility] Of [place]. [facility]             ONETOUCH ULTRA test strip [Pharmacy Med Name: ONETOUCH ULTRA TEST STRIP] 200 strip 1    Sig: USE TO TEST BLOOD SUGAR UP TO FOUR TIMES DAILY.     Endocrinology: Diabetes -  Testing Supplies Passed - 01/26/2022  9:31 AM      Passed - Valid encounter within last 12 months    Recent Outpatient Visits           1 month ago Viral URI with cough   [place], NP   5 months ago Encounter for general adult medical examination with abnormal findings   [facility] [place], Coralie Keens, NP   1 year ago Type 2 diabetes mellitus with complication [facility])   [facility] [place], Coralie Keens, NP       Future Appointments             In 3 months Ellyn Hack Leonie Green, MD [place] [facility] Dept Of [place]. [place]

## 2022-01-26 NOTE — Telephone Encounter (Signed)
Received a call from Troy Parrish and his wife this evening regarding a troublesome coughing episode with associated presyncopal symptoms. He checked his blood pressure and found it to be low (70's/50 -> 89/58) with a HR of ~115. At the time of my call his presyncope had resolved and cough was improving (chronic). His blood pressure was rechecked and found to be 96/68 with a HR of 114. He denied any chest pain or shortness of breath. I instructed him to increase fluid intake and monitor his vitals tonight. I let him know that if his symptoms recur or his blood pressure is again in the 76'J systolic, he should present to the emergency department.

## 2022-01-26 NOTE — Telephone Encounter (Signed)
Requested medication (s) are due for refill today: no  Requested medication (s) are on the active medication list: yes  Last refill:  both reordered 01/26/22   Future visit scheduled: no  Notes to clinic:  meds not delegated to refll or refuse    Requested Prescriptions  Pending Prescriptions Disp Refills   clobetasol cream (TEMOVATE) 0.05 % [Pharmacy Med Name: CLOBETASOL 0.05% CREAM] 30 g     Sig: APPLY TOPICALLY DAILY AS NEEDED (ECZEMA)     Not Delegated - Dermatology:  Corticosteroids Failed - 01/26/2022  9:31 AM      Failed - This refill cannot be delegated      Passed - Valid encounter within last 12 months    Recent Outpatient Visits           1 month ago Viral URI with cough   [place], NP   5 months ago Encounter for general adult medical examination with abnormal findings   [facility] [place], Coralie Keens, NP   1 year ago Type 2 diabetes mellitus with complication [facility])   [facility], Coralie Keens, NP       Future Appointments             In 3 months Ellyn Hack Leonie Green, MD [place] [facility] Of [place]. [facility]             ketoconazole (NIZORAL) 2 % shampoo [Pharmacy Med Name: KETOCONAZOLE 2% SHAMPOO] 120 mL     Sig: APPLY TOPICALLY TWICE WEEKLY     Not Delegated - Over the Counter: OTC 2 Failed - 01/26/2022  9:31 AM      Failed - This refill cannot be delegated      Passed - Valid encounter within last 12 months    Recent Outpatient Visits           1 month ago Viral URI with cough   [facility] [place], Coralie Keens, NP   5 months ago Encounter for general adult medical examination with abnormal findings   [facility] [place], Coralie Keens, NP   1 year ago Type 2 diabetes mellitus with complication [facility])   [facility], Coralie Keens, NP       Future Appointments             In 3 months Ellyn Hack Leonie Green, MD [place] [facility] Of [place]. [facility] [employer] [facility]            Signed Prescriptions Disp Refills   ONETOUCH ULTRA test strip 200 strip 1    Sig: USE TO TEST BLOOD SUGAR UP TO FOUR TIMES DAILY.     Endocrinology: Diabetes - Testing Supplies Passed - 01/26/2022  9:31 AM      Passed - Valid encounter within last 12 months    Recent Outpatient Visits           1 month ago Viral URI with cough   [place], NP   5 months ago Encounter for general adult medical examination with abnormal findings   [facility] [place], Coralie Keens, NP   1 year ago Type 2 diabetes mellitus with complication [facility])   [facility], Coralie Keens, NP       Future Appointments  In 3 months Leonie Man, MD [place] A Dept Of . [place]

## 2022-01-26 NOTE — ED Triage Notes (Addendum)
Patient states he was speaking with a friend over the phone and began feeling "weird"/dizzy and dazed with tingling in the left fingers and tightness/pressure in the chest. Patient also states he noticed his speech was dragging after that. Symptoms noticed at [place]. Patient states he also has a cough and hx of COPD. Patient states he checked his blood pressure (77/42). Patient takes effiant. No tingling, pain, slurred speech in triage.

## 2022-01-27 ENCOUNTER — Emergency Department (HOSPITAL_COMMUNITY): Payer: PPO

## 2022-01-27 DIAGNOSIS — R059 Cough, unspecified: Secondary | ICD-10-CM | POA: Diagnosis not present

## 2022-01-27 DIAGNOSIS — R079 Chest pain, unspecified: Secondary | ICD-10-CM | POA: Diagnosis not present

## 2022-01-27 DIAGNOSIS — R531 Weakness: Secondary | ICD-10-CM | POA: Diagnosis not present

## 2022-01-27 LAB — RESP PANEL BY RT-PCR (RSV, FLU A&B, COVID)  RVPGX2
Influenza A by PCR: NEGATIVE
Influenza B by PCR: NEGATIVE
Resp Syncytial Virus by PCR: NEGATIVE
SARS Coronavirus 2 by RT PCR: NEGATIVE

## 2022-01-27 LAB — TROPONIN I (HIGH SENSITIVITY): Troponin I (High Sensitivity): 5 ng/L (ref <18)

## 2022-01-27 LAB — LACTIC ACID, PLASMA: Lactic Acid, Venous: 1.1 mmol/L (ref 0.5–1.9)

## 2022-01-27 MED ORDER — SODIUM CHLORIDE 0.9 % IV BOLUS (SEPSIS)
1000.0000 mL | Freq: Once | INTRAVENOUS | Status: AC
  Administered 2022-01-27: 1000 mL via INTRAVENOUS

## 2022-01-27 MED ORDER — SODIUM CHLORIDE 0.9 % IV BOLUS
2000.0000 mL | Freq: Once | INTRAVENOUS | Status: AC
  Administered 2022-01-27: 2000 mL via INTRAVENOUS

## 2022-01-27 MED ORDER — DOXYCYCLINE HYCLATE 100 MG PO CAPS: 100.0000 mg | ORAL_CAPSULE | Freq: Two times a day (BID) | ORAL | 0 refills | Status: DC

## 2022-01-27 MED ORDER — ONDANSETRON HCL 4 MG/2ML IJ SOLN
4.0000 mg | Freq: Once | INTRAMUSCULAR | Status: AC
  Administered 2022-01-27: 4 mg via INTRAVENOUS
  Filled 2022-01-27: qty 2

## 2022-01-27 NOTE — ED Provider Notes (Signed)
[facility] EMERGENCY DEPARTMENT Provider Note   CSN: [id number] Arrival date & time: 01/26/22  2202     History  Chief Complaint  Patient presents with   Tingling   Chest Pain    Troy Parrish. is a 61 y.o. male.  HPI Patient with history of CAD, COPD presents for multiple complaints Patient reports he was talk on the phone around 8 PM when he started feeling lightheaded.  He reports he had mild numbness in his left hand.  No vertigo.  He denies fever/headache.  No overt visual changes.  No focal weakness.  No active chest pain or severe back pain.  He mentions he had very mild chest tightness that resolved spontaneously  no diarrhea.  No change in medicines.  His wife checked his vital signs and his heart rate was over 120 and his blood pressure was systolic in the 15V. No recent alcohol use.  He denies slurred speech.  No facial weakness.  He reports when he felt generalized weakness he had some difficulty expressing himself but it was not slurred Patient also reports increasing cough recently. Denies any other upper respiratory symptoms This does not feel like his previous MI Home Medications Prior to Admission medications   Medication Sig Start Date End Date Taking? Authorizing Provider  albuterol (VENTOLIN HFA) 108 (90 Base) MCG/ACT inhaler INHALE TWO PUFFS BY MOUTH EVERY 6 HOURS AS NEEDED FOR SHORTNESS OF BREATH 01/26/22   Jearld Fenton, NP  blood glucose meter kit and supplies Dispense based on patient and insurance preference. Use up to four times daily as directed. (FOR ICD-10 E10.9, E11.9). 12/25/20   Jearld Fenton, NP  clobetasol cream (TEMOVATE) 7.61 % Apply 1 Application topically daily as needed (eczema). 01/26/22   Jearld Fenton, NP  EFFIENT 10 MG TABS tablet Take 1 tablet (10 mg total) by mouth daily. 10/19/21   Leonie Man, MD  fenofibrate (TRICOR) 48 MG tablet TAKE ONE TABLET BY MOUTH DAILY 04/06/21   Leonie Man, MD   fluticasone [facility]) 50 MCG/ACT nasal spray Place 2 sprays into both nostrils daily. 05/15/17 11/12/21  Johnn Hai, PA-C  fluticasone-salmeterol (ADVAIR) 100-50 MCG/ACT AEPB INHALE ONE PUFF BY MOUTH EVERY MORNING AND EVERY NIGHT AT BEDTIME 12/04/21   Jearld Fenton, NP  ketoconazole (NIZORAL) 2 % shampoo Apply 1 Application topically 2 (two) times a week. 01/28/22   Jearld Fenton, NP  Lancets (ONETOUCH DELICA PLUS [id number]) MISC USE TO TEST BLOOD SUGAR UP TO 4 TIMES DAILY 04/24/21   Jearld Fenton, NP  Melatonin 1 MG CAPS Take 1 mg by mouth at bedtime as needed (sleep).    [provider]  metoprolol succinate (TOPROL-XL) 25 MG 24 hr tablet TAKE 1 AND 1/2 TABLET BY MOUTH EVERY EVENING 06/01/21   Leonie Man, MD  MODERNA COVID-19 BIVAL BOOSTER 50 MCG/0.5ML injection  10/30/20   [provider]  nitroGLYCERIN (NITROSTAT) 0.4 MG SL tablet Place 1 tablet (0.4 mg total) under the tongue every 5 (five) minutes as needed for chest pain. 05/08/21   Leonie Man, MD  Omega-3 Fatty Acids (FISH OIL) 1000 MG CPDR Take 3 g by mouth daily. 08/17/18   Leonie Man, MD  omeprazole (PRILOSEC) 20 MG capsule TAKE 1 CAPSULE BY MOUTH DAILY 11/16/21   Leonie Man, MD  omeprazole (PRILOSEC) 40 MG capsule Take 1 capsule (40 mg total) by mouth daily. 05/01/20   Leonie Man, MD  ONETOUCH ULTRA test strip USE TO TEST BLOOD SUGAR UP TO FOUR TIMES DAILY. 01/26/22   Jearld Fenton, NP  promethazine-dextromethorphan (PROMETHAZINE-DM) 6.25-15 MG/5ML syrup Take 5 mLs by mouth 4 (four) times daily as needed for cough. 12/24/21   Jearld Fenton, NP  rosuvastatin (CRESTOR) 40 MG tablet TAKE ONE TABLET BY MOUTH DAILY 05/18/21   Leonie Man, MD  sildenafil (VIAGRA) 50 MG tablet Take 25 to 50 mg tablet  as needed for erectile dysfunction 10/19/21   Leonie Man, MD      Allergies    Niacin and related    Review of Systems   Review of Systems  Constitutional:  Positive for  fatigue. Negative for fever.  Eyes:  Negative for visual disturbance.  Respiratory:  Positive for cough. Negative for shortness of breath.   Gastrointestinal:  Negative for diarrhea and vomiting.  Neurological:  Positive for light-headedness. Negative for syncope and headaches.    Physical Exam Updated Vital Signs BP 108/72   Pulse 75   Temp 98.1 F (36.7 C) (Oral)   Resp (!) 24   Ht 1.803 m (_0 )   Wt 81.6 kg   SpO2 96%   BMI 25.10 kg/m  Physical Exam CONSTITUTIONAL: Well developed/well nourished HEAD: Normocephalic/atraumatic EYES: EOMI/PERRL, no nystagmus, no ptosis ENMT: Mucous membranes moist NECK: supple no meningeal signs CV: S1/S2 noted, no murmurs/rubs/gallops noted LUNGS: Lungs are clear to auscultation bilaterally, no apparent distress ABDOMEN: soft, nontender, no rebound or guarding GU:no cva tenderness NEURO:Awake/alert, face symmetric, no arm or leg drift is noted Equal 5/5 strength with shoulder abduction, elbow flex/extension, wrist flex/extension in upper extremities and equal hand grips bilaterally Equal 5/5 strength with hip flexion,knee flex/extension, foot dorsi/plantar flexion Cranial nerves 3/4/5/6/08/16/08/11/12 tested and intact No past pointing Sensation to light touch intact in all extremities EXTREMITIES: pulses normalx4, full ROM SKIN: warm, color normal PSYCH: no abnormalities of mood noted  ED Results / Procedures / Treatments   Labs (all labs ordered are listed, but only abnormal results are displayed) Labs Reviewed  BASIC METABOLIC PANEL - Abnormal; Notable for the following components:      Result Value   CO2 21 (*)    Glucose, Bld 129 (*)    Creatinine, Ser 1.68 (*)    GFR, Estimated 46 (*)    All other components within normal limits  CBC - Abnormal; Notable for the following components:   WBC 15.4 (*)    Hemoglobin 17.1 (*)    All other components within normal limits  URINALYSIS, ROUTINE W REFLEX MICROSCOPIC - Abnormal;  Notable for the following components:   Color, Urine AMBER (*)    APPearance CLOUDY (*)    Protein, ur >=300 (*)    Bacteria, UA FEW (*)    All other components within normal limits  RESP PANEL BY RT-PCR (RSV, FLU A&B, COVID)  [id number]  LACTIC ACID, PLASMA  TROPONIN I (HIGH SENSITIVITY)  TROPONIN I (HIGH SENSITIVITY)    EKG EKG Interpretation  Date/Time:  Tuesday January 26 2022 22:09:20 EST Ventricular Rate:  106 PR Interval:  134 QRS Duration: 90 QT Interval:  322 QTC Calculation: 427 R Axis:   87 Text Interpretation: Sinus tachycardia Cannot rule out Anterior infarct , age undetermined Abnormal ECG When compared with ECG of 13-Feb-2018 06:28, PREVIOUS ECG IS PRESENT Confirmed by Ripley Fraise ([id number]) on 01/27/2022 3:30:43 AM  Radiology DG Chest Port 1 View  Result Date: 01/27/2022 CLINICAL DATA:  Cough, chest pain and weakness.  EXAM: PORTABLE CHEST 1 VIEW COMPARISON:  January 23, 2018 FINDINGS: The heart size and mediastinal contours are within normal limits. A coronary artery stent is in place. Mild atelectasis and/or infiltrate is seen within the left lung base. There is no evidence of a pleural effusion or pneumothorax. Multilevel degenerative changes seen throughout the thoracic spine. IMPRESSION: Mild left basilar atelectasis and/or infiltrate. Electronically Signed   By: Virgina Norfolk M.D.   On: 01/27/2022 04:05    Procedures Procedures    Medications Ordered in ED Medications  sodium chloride 0.9 % bolus 2,000 mL (0 mLs Intravenous Stopped 01/27/22 0607)  ondansetron (ZOFRAN) injection 4 mg (4 mg Intravenous Given 01/27/22 0256)  sodium chloride 0.9 % bolus 1,000 mL (0 mLs Intravenous Stopped 01/27/22 0448)    ED Course/ Medical Decision Making/ A&P Clinical Course as of 01/27/22 0620  Wed Jan 27, 2022  0433 Creatinine(!): 1.68 Renal insufficiency [DW]  0433 WBC(!): 15.4 Leukocytosis [DW]  0439 Patient presented for feeling lightheaded.  He had no  focal weakness to suggest stroke.  He reported only mild chest tightness that resolved soon after.  No signs of ACS.  He did have documented low blood pressure and had drops in his BP here.  He is responding to IV fluids. [DW]  [phone] Pt feels much improved He is ambulatory  Vitals improved Will tx for possible pneumonia and dehydration  No signs of sepsis He is back to baseline Will start doxcycline  Tele monitor reviewed - no issues [DW]    Clinical Course User Index [DW] Ripley Fraise, MD                           Medical Decision Making Amount and/or Complexity of Data Reviewed Labs: ordered. Decision-making details documented in ED Course. Radiology: ordered.  Risk Prescription drug management.   This patient presents to the ED for concern of weakness, this involves an extensive number of treatment options, and is a complaint that carries with it a high risk of complications and morbidity.  The differential diagnosis includes but is not limited to CVA, intracranial hemorrhage, acute coronary syndrome, renal failure, urinary tract infection, electrolyte disturbance, pneumonia    Comorbidities that complicate the patient evaluation: Patient's presentation is complicated by their history of CAD and COPD  Social Determinants of Health: Patient's  tobacco use   increases the complexity of managing their presentation  Additional history obtained: Additional history obtained from family Records reviewed  outpatient records  Lab Tests: I Ordered, and personally interpreted labs.  The pertinent results include: Renal insufficiency, leukocytosis  Imaging Studies ordered: I ordered imaging studies including X-ray chest   I independently visualized and interpreted imaging which showed infiltrate versus atelectasis I agree with the radiologist interpretation  Cardiac Monitoring: The patient was maintained on a cardiac monitor.  I personally viewed and interpreted the cardiac  monitor which showed an underlying rhythm of:  sinus rhythm  Medicines ordered and prescription drug management: I ordered medication including IV fluids for lightheadedness Reevaluation of the patient after these medicines showed that the patient    improved  Critical Interventions:   IV fluids     Reevaluation: After the interventions noted above, I reevaluated the patient and found that they have :improved  Complexity of problems addressed: Patient's presentation is most consistent with  acute presentation with potential threat to life or bodily function  Disposition: After consideration of the diagnostic results and the patient's response  to treatment,  I feel that the patent would benefit from discharge   .           Final Clinical Impression(s) / ED Diagnoses Final diagnoses:  Dehydration  Community acquired pneumonia of left lower lobe of lung    Rx / DC Orders ED Discharge Orders          Ordered    doxycycline (VIBRAMYCIN) 100 MG capsule  2 times daily        01/27/22 9030              Ripley Fraise, MD 01/27/22 [phone]

## 2022-01-27 NOTE — ED Notes (Signed)
Pt reports feeling nauseous.

## 2022-01-27 NOTE — ED Notes (Signed)
Pt brought back to triage room 2 for re-evaluation after concern for low blood pressure when waiting in the lobby.

## 2022-02-14 ENCOUNTER — Other Ambulatory Visit: Payer: Self-pay | Admitting: Cardiology

## 2022-02-26 ENCOUNTER — Ambulatory Visit (INDEPENDENT_AMBULATORY_CARE_PROVIDER_SITE_OTHER): Payer: PPO | Admitting: Family Medicine

## 2022-02-26 ENCOUNTER — Encounter: Payer: Self-pay | Admitting: Family Medicine

## 2022-02-26 ENCOUNTER — Other Ambulatory Visit: Payer: Self-pay | Admitting: Cardiology

## 2022-02-26 VITALS — BP 134/86 | HR 85 | Temp 97.7°F | Wt 177.0 lb

## 2022-02-26 DIAGNOSIS — H6121 Impacted cerumen, right ear: Secondary | ICD-10-CM

## 2022-02-26 NOTE — Patient Instructions (Addendum)
Thank you for coming to the office today.  We were able to remove almost all of the ear wax with flushing in the office today  Ear canal on both sides does appear slightly narrow this is likely a factor with the walk build up  [facility] [id number]  [place], [place] [address] Ph: [phone]  If you haven't heard back within 2 weeks you can call them to check the status of the referral   Please schedule a Follow-up Appointment to: Return if symptoms worsen or fail to improve.  If you have any other questions or concerns, please feel free to call the office or send a message through [place]. You may also schedule an earlier appointment if necessary.  Additionally, you may be receiving a survey about your experience at our office within a few days to 1 week by e-mail or mail. We value your feedback.  Nobie Putnam, DO [place]

## 2022-02-26 NOTE — Progress Notes (Signed)
Subjective:    Patient ID: Troy Parrish., male    DOB: 05/31/1960, 62 y.o.   MRN: [id number]  Troy Parrish. is a 62 y.o. male presenting on 02/26/2022 for Ear Pain /Wax  Patient presents for a same day appointment.  PCP Webb Silversmith, FNP   HPI  Right Ear Cerumen / Hearing Loss Recently cleaned ear wax here, successful. Then it has returned very quickly. She has tried Debrox regularly to help prevent ear wax but not successful. He tried debrox again recently and it seemed to worsen symptoms Now he could not hear again out of R ear and feels clogged. He is asking about possible referral to ENT He is leaving on Sunday for plane trip to [place] for his mother's funeral Asking about Referral ENT for further assistance if this is a recurrent problem.      02/26/2022   11:06 AM 12/24/2021    3:36 PM 10/09/2021   10:20 AM  Depression screen PHQ 2/9  Decreased Interest 0 0 0  Down, Depressed, Hopeless 0 0 0  PHQ - 2 Score 0 0 0  Altered sleeping 0  0  Tired, decreased energy 0  0  Change in appetite 0  0  Feeling bad or failure about yourself  0  0  Trouble concentrating 0  0  Moving slowly or fidgety/restless 0  0  Suicidal thoughts 0  0  PHQ-9 Score 0  0  Difficult doing work/chores Not difficult at all  Not difficult at all    Social History   Tobacco Use   Smoking status: Every Day    Packs/day: 0.10    Years: 40.00    Total pack years: 4.00    Types: Cigarettes   Smokeless tobacco: Never   Tobacco comments:    Quit with Bupropion '150mg'$  - > unfortunately, he restarted; has cut all the way down to maybe 1 or 2 cigarettes a day, some days he goes without any.-Some days even multiple days without cigarettes.  Vaping Use   Vaping Use: Never used  Substance Use Topics   Alcohol use: Yes    Comment: rare   Drug use: No    Review of Systems Per HPI unless specifically indicated above     Objective:    BP 134/86 (BP Location: Right Arm, Patient Position:  Sitting, Cuff Size: Normal)   Pulse 85   Temp 97.7 F (36.5 C) (Temporal)   Wt 177 lb (80.3 kg)   SpO2 98%   BMI 24.69 kg/m   Wt Readings from Last 3 Encounters:  02/26/22 177 lb (80.3 kg)  01/26/22 180 lb (81.6 kg)  12/24/21 178 lb (80.7 kg)    Physical Exam Vitals and nursing note reviewed.  Constitutional:      General: He is not in acute distress.    Appearance: Normal appearance. He is well-developed. He is not diaphoretic.     Comments: Well-appearing, comfortable, cooperative  HENT:     Head: Normocephalic and atraumatic.     Left Ear: Tympanic membrane, ear canal and external ear normal. There is no impacted cerumen.     Ears:     Comments: R ear canal some slight narrowing appearance. Impacted cerumen. Eyes:     General:        Right eye: No discharge.        Left eye: No discharge.     Conjunctiva/sclera: Conjunctivae normal.  Cardiovascular:     Rate and Rhythm:  Normal rate.  Pulmonary:     Effort: Pulmonary effort is normal.  Skin:    General: Skin is warm and dry.     Findings: No erythema or rash.  Neurological:     Mental Status: He is alert and oriented to person, place, and time.  Psychiatric:        Mood and Affect: Mood normal.        Behavior: Behavior normal.        Thought Content: Thought content normal.     Comments: Well groomed, good eye contact, normal speech and thoughts     ________________________________________________________ PROCEDURE NOTE Date: 02/26/22 Right Ear Lavage / Cerumen Removal Discussed benefits and risks (including pain / discomforts, dizziness, minor abrasion of ear canal). Verbal consent given by patient. Medication:  carbamide peroxide ear drops, Ear Lavage Solution (warm water + hydrogen peroxide) Performed by Dr Parks Ranger Several drops of carbamide peroxide placed in R ear, allowed to sit for a moment. Ear lavage solution flushed into R ear in attempt to dislodge and remove ear wax.  Repeat Ear Exam: -  Completely removed cerumen now, with clear ear canals and visible TMs clear and normal appearance.   Ear canal appears irritated without any injury. It is still narrow.   Results for orders placed or performed during the hospital encounter of 01/26/22  Resp panel by RT-PCR (RSV, Flu A&B, Covid) Anterior Nasal Swab   Specimen: Anterior Nasal Swab  Result Value Ref Range   SARS Coronavirus 2 by RT PCR NEGATIVE NEGATIVE   Influenza A by PCR NEGATIVE NEGATIVE   Influenza B by PCR NEGATIVE NEGATIVE   Resp Syncytial Virus by PCR NEGATIVE NEGATIVE  Basic metabolic panel  Result Value Ref Range   Sodium 136 135 - 145 mmol/L   Potassium 4.0 3.5 - 5.1 mmol/L   Chloride 103 98 - 111 mmol/L   CO2 21 (L) 22 - 32 mmol/L   Glucose, Bld 129 (H) 70 - 99 mg/dL   BUN 13 8 - 23 mg/dL   Creatinine, Ser 1.68 (H) 0.61 - 1.24 mg/dL   Calcium 9.6 8.9 - 10.3 mg/dL   GFR, Estimated 46 (L) >60 mL/min   Anion gap 12 5 - 15  CBC  Result Value Ref Range   WBC 15.4 (H) 4.0 - 10.5 K/uL   RBC 5.78 4.22 - 5.81 MIL/uL   Hemoglobin 17.1 (H) 13.0 - 17.0 g/dL   HCT 51.4 39.0 - 52.0 %   MCV 88.9 80.0 - 100.0 fL   MCH 29.6 26.0 - 34.0 pg   MCHC 33.3 30.0 - 36.0 g/dL   RDW 14.8 11.5 - 15.5 %   Platelets 363 150 - 400 K/uL   nRBC 0.0 0.0 - 0.2 %  Urinalysis, Routine w reflex microscopic Urine, Clean Catch  Result Value Ref Range   Color, Urine AMBER (A) YELLOW   APPearance CLOUDY (A) CLEAR   Specific Gravity, Urine 1.016 1.005 - 1.030   pH 5.0 5.0 - 8.0   Glucose, UA NEGATIVE NEGATIVE mg/dL   Hgb urine dipstick NEGATIVE NEGATIVE   Bilirubin Urine NEGATIVE NEGATIVE   Ketones, ur NEGATIVE NEGATIVE mg/dL   Protein, ur >=300 (A) NEGATIVE mg/dL   Nitrite NEGATIVE NEGATIVE   Leukocytes,Ua NEGATIVE NEGATIVE   RBC / HPF 6-10 0 - 5 RBC/hpf   WBC, UA 6-10 0 - 5 WBC/hpf   Bacteria, UA FEW (A) NONE SEEN   Squamous Epithelial / HPF 0-5 0 - 5   Mucus  PRESENT    Hyaline Casts, UA PRESENT   Lactic acid, plasma  Result  Value Ref Range   Lactic Acid, Venous 1.1 0.5 - 1.9 mmol/L  Troponin I (High Sensitivity)  Result Value Ref Range   Troponin I (High Sensitivity) 5 <18 ng/L  Troponin I (High Sensitivity)  Result Value Ref Range   Troponin I (High Sensitivity) 5 <18 ng/L      Assessment & Plan:   Problem List Items Addressed This Visit   None Visit Diagnoses     Impacted cerumen of right ear    -  Primary   Relevant Orders   Ambulatory referral to ENT   Hearing loss due to cerumen impaction, right       Relevant Orders   Ambulatory referral to ENT       No orders of the defined types were placed in this encounter.  Significant amount of large thick impacted cerumen R>L ear, suspected primary cause of current reduced bilateral hearing discomfort R ear  Plan: 1. Successful office ear lavage cerumen removal today, re-evaluated with clear ear canals and normal TMs - Seems has fairly narrow ear canals - seems to have failed outpatient debrox, may need more frequent cleaning or other regimen  referral to [facility] for further eval / recurrent large volume cerumen impaction R>L ear with hearing loss as a result   Orders Placed This Encounter  Procedures   Ambulatory referral to ENT    Referral Priority:   Routine    Referral Type:   Consultation    Referral Reason:   Specialty Services Required    Requested Specialty:   Otolaryngology    Number of Visits Requested:   1      Follow up plan: Return if symptoms worsen or fail to improve.  Nobie Putnam, [facility] 02/26/2022, 10:38 AM

## 2022-03-16 DIAGNOSIS — H6983 Other specified disorders of Eustachian tube, bilateral: Secondary | ICD-10-CM | POA: Diagnosis not present

## 2022-03-16 DIAGNOSIS — H6123 Impacted cerumen, bilateral: Secondary | ICD-10-CM | POA: Diagnosis not present

## 2022-04-22 ENCOUNTER — Other Ambulatory Visit: Payer: Self-pay | Admitting: Cardiology

## 2022-04-22 ENCOUNTER — Other Ambulatory Visit: Payer: Self-pay | Admitting: Internal Medicine

## 2022-04-22 ENCOUNTER — Other Ambulatory Visit (HOSPITAL_COMMUNITY): Payer: Self-pay

## 2022-04-22 MED ORDER — OMEPRAZOLE 40 MG PO CPDR: 40.0000 mg | DELAYED_RELEASE_CAPSULE | Freq: Every day | ORAL | 0 refills | Status: DC

## 2022-04-22 MED ORDER — ALBUTEROL SULFATE HFA 108 (90 BASE) MCG/ACT IN AERS: INHALATION_SPRAY | RESPIRATORY_TRACT | 0 refills | Status: DC

## 2022-04-22 MED ORDER — OMEPRAZOLE 20 MG PO CPDR: 20.0000 mg | DELAYED_RELEASE_CAPSULE | Freq: Every day | ORAL | 2 refills | Status: DC

## 2022-04-23 ENCOUNTER — Encounter (HOSPITAL_COMMUNITY): Payer: Self-pay

## 2022-04-23 ENCOUNTER — Other Ambulatory Visit (HOSPITAL_COMMUNITY): Payer: Self-pay

## 2022-04-27 DIAGNOSIS — H6121 Impacted cerumen, right ear: Secondary | ICD-10-CM | POA: Diagnosis not present

## 2022-04-27 DIAGNOSIS — H6983 Other specified disorders of Eustachian tube, bilateral: Secondary | ICD-10-CM | POA: Diagnosis not present

## 2022-04-30 ENCOUNTER — Ambulatory Visit: Payer: PPO | Attending: Cardiology | Admitting: Cardiology

## 2022-04-30 ENCOUNTER — Other Ambulatory Visit: Payer: Self-pay | Admitting: Cardiology

## 2022-04-30 ENCOUNTER — Encounter: Payer: Self-pay | Admitting: Cardiology

## 2022-04-30 VITALS — BP 106/70 | HR 85 | Ht 71.0 in | Wt 177.2 lb

## 2022-04-30 DIAGNOSIS — I10 Essential (primary) hypertension: Secondary | ICD-10-CM | POA: Diagnosis not present

## 2022-04-30 DIAGNOSIS — I5032 Chronic diastolic (congestive) heart failure: Secondary | ICD-10-CM

## 2022-04-30 DIAGNOSIS — I255 Ischemic cardiomyopathy: Secondary | ICD-10-CM | POA: Diagnosis not present

## 2022-04-30 DIAGNOSIS — I2109 ST elevation (STEMI) myocardial infarction involving other coronary artery of anterior wall: Secondary | ICD-10-CM | POA: Diagnosis not present

## 2022-04-30 DIAGNOSIS — E1169 Type 2 diabetes mellitus with other specified complication: Secondary | ICD-10-CM

## 2022-04-30 DIAGNOSIS — I2089 Other forms of angina pectoris: Secondary | ICD-10-CM

## 2022-04-30 DIAGNOSIS — Z720 Tobacco use: Secondary | ICD-10-CM | POA: Diagnosis not present

## 2022-04-30 DIAGNOSIS — I25119 Atherosclerotic heart disease of native coronary artery with unspecified angina pectoris: Secondary | ICD-10-CM | POA: Diagnosis not present

## 2022-04-30 DIAGNOSIS — J439 Emphysema, unspecified: Secondary | ICD-10-CM | POA: Diagnosis not present

## 2022-04-30 DIAGNOSIS — I2111 ST elevation (STEMI) myocardial infarction involving right coronary artery: Secondary | ICD-10-CM

## 2022-04-30 DIAGNOSIS — E785 Hyperlipidemia, unspecified: Secondary | ICD-10-CM

## 2022-04-30 NOTE — Progress Notes (Unsigned)
Primary Care Provider: Jearld Fenton, NP [place] Cardiologist: Glenetta Hew, MD Electrophysiologist: None  Clinic Note: No chief complaint on file.  ===================================  ASSESSMENT/PLAN   Problem List Items Addressed This Visit   None  ===================================  HPI:    Troy Loboda. is a 63 y.o. male with a PMH below who presents today for *** at the request of Troy Fenton, NP.  Troy Going. was last seen on ***  Recent Hospitalizations:  11/19/2021-cataract surgery 12/19-20/2023, Troy Parrish, ER: Lightheadedness with left hand numbness.  Normal blood pressure.  No slurred speech or weakness besides generalized weakness.  Some mild expressive aphasia.  Noted increased cough, fatigue.  Thought to have CAP with dehydration treated with doxycycline.  Reviewed  CV studies:    The following studies were reviewed today: (if available, images/films reviewed: From Epic Chart or Care Everywhere) ***:  Interval History:   Troy Parrish.   CV Review of Symptoms (Summary): Cardiovascular ROS: {roscv:310661}  REVIEWED OF SYSTEMS   ROS  I have reviewed and (if needed) personally updated the patient's problem list, medications, allergies, past medical and surgical history, social and family history.   PAST MEDICAL HISTORY     PAST SURGICAL HISTORY   Past Surgical History:  Procedure Laterality Date   BIOPSY N/A 06/30/2020   Procedure: BIOPSY;  Surgeon: Lucilla Lame, MD;  Location: [place];  Service: Endoscopy;  Laterality: N/A;   Cardiac MRI [facility]  09/2014   EF 51%. Mod HK of basal-mid Inf wall, mild HK of basal inferoseptum & basal-mid inferolateral wall with hyper enhancement (c/w subendocard RCA MI with significant viability), focal hyper enhancement of apical wall c/w dLAD subendocard MI & complete LAD viability. . No aortic stenosis. Normal RV function. No Ischemia on Adenosine Stress.   CATARACT  EXTRACTION W/PHACO Left 11/19/2021   Procedure: CATARACT EXTRACTION PHACO AND INTRAOCULAR LENS PLACEMENT ([place]) LEFT vivity lens;  Surgeon: Norvel Richards, MD;  Location: [place];  Service: Ophthalmology;  Laterality: Left;  11.32 1:32.6   COLONOSCOPY WITH PROPOFOL N/A 03/28/2015   Procedure: COLONOSCOPY WITH PROPOFOL;  Surgeon: Lucilla Lame, MD;  Location: [place];  Service: Endoscopy;  Laterality: N/A;   COLONOSCOPY WITH PROPOFOL N/A 06/30/2020   Procedure: COLONOSCOPY WITH PROPOFOL;  Surgeon: Lucilla Lame, MD;  Location: [place];  Service: Endoscopy;  Laterality: N/A;   CORONARY ANGIOPLASTY WITH STENT PLACEMENT  2001   ANTERIOR mi with a  PCI to LAD; [place], [place] - Dr. Dorris Fetch   CORONARY ANGIOPLASTY WITH STENT PLACEMENT  2006   [place] by Dr Dorris Fetch -lesion lft circ/OM1 with 2.5 x 12mm Cypher DES   CORONARY STENT INTERVENTION N/A 05/19/2016   Procedure: Coronary Stent Intervention;  Surgeon: Leonie Man, MD;  Location: [place] CV LAB;  Service: Cardiovascular: PCI pOM1 - Synergy DES 2.5 x 16 (overlaps old stent)   ESOPHAGOGASTRODUODENOSCOPY (EGD) WITH PROPOFOL N/A 03/28/2015   Procedure: ESOPHAGOGASTRODUODENOSCOPY (EGD) WITH PROPOFOL;  Surgeon: Lucilla Lame, MD;  Location: [place];  Service: Endoscopy;  Laterality: N/A;   ESOPHAGOGASTRODUODENOSCOPY (EGD) WITH PROPOFOL N/A 06/30/2020   Procedure: ESOPHAGOGASTRODUODENOSCOPY (EGD) WITH PROPOFOL;  Surgeon: Lucilla Lame, MD;  Location: [place];  Service: Endoscopy;  Laterality: N/A;   ESOPHAGOGASTRODUODENOSCOPY (EGD) WITH PROPOFOL N/A 09/11/2020   Procedure: ESOPHAGOGASTRODUODENOSCOPY (EGD) WITH PROPOFOL;  Surgeon: Lucilla Lame, MD;  Location: [place];  Service: Endoscopy;  Laterality: N/A;   INSERTION OF MESH  N/A 04/02/2015   Procedure: INSERTION OF MESH;  Surgeon: Florene Glen, MD;  Location: [facility];  Service: General;  Laterality: N/A;   LEFT AND RIGHT HEART  CATHETERIZATION WITH CORONARY ANGIOGRAM N/A 07/30/2013   Procedure: LEFT AND RIGHT HEART CATHETERIZATION WITH CORONARY ANGIOGRAM;  Surgeon: Leonie Man, MD;  [facility]: [facility] CATH LAB;  Service: Cardiovascular;  2 separate P & mLAD lesions -> PCI, MOd-Severe dRCA disesase with diffuse RPL/PDA disease (FFR)   LEFT HEART CATH  08/03/2013   Procedure: LEFT HEART CATH;  Surgeon: Leonie Man, MD;  Location: [facility] CATH LAB;  Service: Cardiovascular;;FFR of RCA - non-flow-limiting   LEFT HEART CATH AND CORONARY ANGIOGRAPHY N/A 05/19/2016   Procedure: Left Heart Cath and Coronary Angiography;  Surgeon: Leonie Man, MD;  Location: [place] [facility];  Service: Cardiovascular: CULPRIT - 85% pOM1 pre-stent. Stents in both proximal and distal RCA widely patent. PTCA sites in RPL & PDA widely patent with stable 70% small RPL 2. Extensive stenting in the LAD widely patent stents with mild to mod Dz btw prox & mid-distal stents.    LEFT HEART CATH AND CORONARY ANGIOGRAPHY N/A 02/13/2018   Procedure: LEFT HEART CATH AND CORONARY ANGIOGRAPHY;  Surgeon: Leonie Man, MD;  Location: [place] CV [facility];  Service: Cardiovascular: Similar findings to last cath post PCI.  Stents are patent.  Roughly 50% proximal LAD lesion noted, may be progression from prior cath but otherwise stable disease.   LEFT HEART CATHETERIZATION WITH CORONARY ANGIOGRAM N/A 07/07/2011   Procedure: LEFT HEART CATHETERIZATION WITH CORONARY ANGIOGRAM;  Surgeon: Leonie Man, MD;  Location: [facility] CATH LAB;  Service: Cardiovascular;  inferolat post LV infarct ST-elevation MI   MET/CPET  11/09/2011   submax. effort 1.05 RER peak V02 was 51% ,chronotropic incomp.hrt rate lows 80s to 100   MET/CPET  September 2016   [facility]: Mild functional impairment due primarily to mild pulmonary and circulatory limitations. Also suggest physical deconditioning (seen with low-normal aerobic reserve). Mild VQ mismatch with exercise.  Ventilatory reserve exhausted with peak  exercise demonstrated pulmonary limitation. No significant decrease in postexercise FEV1 compared to rest --> suggest no exercise induced bronchospasm   NM MYOVIEW ([facility] HX)  11/30/2011   EF 45% ,exercise 10 METS, infarct\scar w mild perinfarct ischemia -basal inferolat and mid inferolat region   NM MYOVIEW LTD  04/25/2016   Exercised for 9 minutes. Reached 90% max. Heart rate. 9.4 METS. Read as a low risk study with normal EF 55-65%. -> on Review -  there does appear to be a small to moderate sized, medium severtiy reversible perfusion defect in the anterior wall - read by computer, but not noted by reader.   PERCUTANEOUS CORONARY STENT INTERVENTION (PCI-S)  07/07/2011   Procedure: PERCUTANEOUS CORONARY STENT INTERVENTION (PCI-S);  Surgeon: Leonie Man, MD;  Location: [facility] CATH LAB;  Service: Cardiovascular;;occluded RCA ,Integrity Resolute DES 2.75 x 18 mm stent - post-dilated 3.1 mm   PERCUTANEOUS CORONARY STENT INTERVENTION (PCI-S)  07/30/2013   Procedure: PERCUTANEOUS CORONARY STENT INTERVENTION (PCI-S);  Surgeon: Leonie Man, MD;  Location: [facility] CATH LAB;  Service: Cardiovascular;;Distal mid LAD: Xience Alpine DES 2.25 mm x 28 mm (overlapping proximal and distal edge of previous stent) - 2.7 mm; proximal LAD 2.5 mm x 12 mm Xience Alpine DES (2.8 mm);; RCA 60-70% stenosis planned staged procedure   PERCUTANEOUS CORONARY STENT INTERVENTION (PCI-S) N/A 11/15/2013   Procedure: PERCUTANEOUS CORONARY STENT INTERVENTION (PCI-S);  Surgeon: Leonie Man,  MD;  Location: [place] CATH LAB;  Service: Cardiovascular;  Patent Cx & LAD Stents; PCI -dRCA Promus Premier DES 4.0 mm x 16 mm (4.25 mm) dRCA, 2.0 mm Cutting PTCA of RPL2 Ostium & POBA of mRPDA   POLYPECTOMY  03/28/2015   Procedure: POLYPECTOMY;  Surgeon: Lucilla Lame, MD;  Location: [place];  Service: Endoscopy;;   POLYPECTOMY N/A 06/30/2020   Procedure: POLYPECTOMY;  Surgeon: Lucilla Lame, MD;  Location: [place];  Service:  Endoscopy;  Laterality: N/A;   RIGHT HEART CATH  June 2015   Normal pressures with severely reduced cardiac output and index. (3.25/1.55)   TRANSTHORACIC ECHOCARDIOGRAM  07/31/2013; February 2017   a. LVEF 45-50. Inferior posterior hypokinesis with mild dilation. Apparent normal diastolic pressures;    UMBILICAL HERNIA REPAIR N/A 04/02/2015   Procedure: HERNIA REPAIR UMBILICAL ADULT;  Surgeon: Florene Glen, MD;  Location: [facility];  Service: General;  Laterality: N/A;    Immunization History  Administered Date(s) Administered   Influenza Split 11/09/2012   Influenza,inj,Quad PF,6+ Mos 11/16/2013, 11/29/2014, 11/11/2015, 11/10/2016, 12/01/2017, 12/07/2018, 12/26/2019, 12/03/2020, 11/23/2021   Moderna SARS-COV2 Booster Vaccination 05/08/2020   Moderna Sars-Covid-2 Vaccination 04/25/2019, 05/14/2019, 01/10/2020, 10/30/2020   Pneumococcal Conjugate-13 11/29/2014, 05/05/2020   Pneumococcal Polysaccharide-23 07/11/2011, 11/10/2016, 01/02/2019   Tdap 03/04/2015    MEDICATIONS/ALLERGIES   No outpatient medications have been marked as taking for the 04/30/22 encounter (Appointment) with Leonie Man, MD.    Allergies  Allergen Reactions   Niacin And Related Hives and Itching    SOCIAL HISTORY/FAMILY HISTORY   Reviewed in Epic:  Pertinent findings:  Social History   Tobacco Use   Smoking status: Every Day    Packs/day: 0.10    Years: 40.00    Additional pack years: 0.00    Total pack years: 4.00    Types: Cigarettes   Smokeless tobacco: Never   Tobacco comments:    Quit with Bupropion 150mg  - > unfortunately, he restarted; has cut all the way down to maybe 1 or 2 cigarettes a day, some days he goes without any.-Some days even multiple days without cigarettes.  Vaping Use   Vaping Use: Never used  Substance Use Topics   Alcohol use: Yes    Comment: rare   Drug use: No   Social History   Social History Narrative   He is a married father of 78, grandfather of 87. He  does not really get routine exercise.    He is down to 1-2 cigarettes a day-sometimes going more than 2 to 3 days without 1.. He is very seriously wanting to quit.     He has a social alcoholic beverage every now and then.       He is no longer working, medically retired essentially.  His wife still works.  He and his wife enjoy traveling have done multiple trips and vacations over the last couple years including several trips to [place] to be near her family.      Late summer 2023-2-week trip to [place] [facility], and then drove up to the [employer] including seeing [place] and [place].  Lots of walking and hiking.    OBJCTIVE -PE, EKG, labs   Wt Readings from Last 3 Encounters:  02/26/22 177 lb (80.3 kg)  01/26/22 180 lb (81.6 kg)  12/24/21 178 lb (80.7 kg)    Physical Exam: There were no vitals taken for this visit. Physical Exam   Adult ECG Report  Rate: *** ;  Rhythm: {rhythm:17366};   Narrative Interpretation: ***  Recent Labs:  ***  Lab Results  Component Value Date   CHOL 114 08/14/2021   HDL 37 (L) 08/14/2021   LDLCALC 53 08/14/2021   LDLDIRECT 53.0 07/05/2018   TRIG 154 (H) 08/14/2021   CHOLHDL 3.1 08/14/2021   Lab Results  Component Value Date   CREATININE 1.68 (H) 01/26/2022   BUN 13 01/26/2022   NA 136 01/26/2022   K 4.0 01/26/2022   CL 103 01/26/2022   CO2 21 (L) 01/26/2022      Latest Ref Rng & Units 01/26/2022   10:36 PM 08/14/2021    8:08 AM 12/23/2020    9:59 AM  CBC  WBC 4.0 - 10.5 K/uL 15.4  11.6  13.1   Hemoglobin 13.0 - 17.0 g/dL 17.1  16.8  17.6   Hematocrit 39.0 - 52.0 % 51.4  50.5  53.1   Platelets 150 - 400 K/uL 363  370  421     Lab Results  Component Value Date   HGBA1C 5.8 (H) 08/14/2021   Lab Results  Component Value Date   TSH 2.330 10/12/2016    ================================================== I spent a total of ***minutes with the patient spent in direct patient consultation.  Additional time  spent with chart review  / charting (studies, outside notes, etc): *** min Total Time: *** min  Current medicines are reviewed at length with the patient today.  (+/- concerns) ***  Notice: This dictation was prepared with Dragon dictation along with smart phrase technology. Any transcriptional errors that result from this process are unintentional and may not be corrected upon review.  Studies Ordered:   No orders of the defined types were placed in this encounter.  No orders of the defined types were placed in this encounter.   Patient Instructions / Medication Changes & Studies & Tests Ordered   There are no Patient Instructions on file for this visit.     Leonie Man, MD, MS Glenetta Hew, M.D., M.S. Interventional Cardiologist  [place]  Pager # [phone] Phone # [phone] [address]. [place], [place] [address]   Thank you for choosing [facility] [place]!!

## 2022-04-30 NOTE — Patient Instructions (Signed)

## 2022-05-10 ENCOUNTER — Encounter: Payer: Self-pay | Admitting: Cardiology

## 2022-05-10 NOTE — Progress Notes (Incomplete)
Primary Care Provider: Jearld Fenton, NP [place] Cardiologist: Glenetta Hew, MD Electrophysiologist: None  Clinic Note: No chief complaint on file.  ===================================  ASSESSMENT/PLAN   Problem List Items Addressed This Visit       Cardiology Problems   Hyperlipidemia associated with type 2 diabetes mellitus (Chronic)   Relevant Orders   EKG 12-Lead (Completed)   Essential hypertension (Chronic)   Relevant Orders   EKG 12-Lead (Completed)   Cardiomyopathy, ischemic; EF~45% with hypokinesis of the inferior and inferolateral myocardium; CO 3.25 - Primary (Chronic)   Relevant Orders   EKG 12-Lead (Completed)   ===================================  HPI:    Troy Parrish. is a 62 y.o. male with a PMH below who presents today for 34-month follow-up at the request of Jearld Fenton, NP.  Pertinent PMH: Longstanding MV CAD-PCI (has had a STEMI of all 3 major arteries),  Last PCI was in 2018 Last cath 2020-patent stents Is on maintenance Effient because of Plavix nonresponse and intolerance of Brilinta. ICM with EF roughly 45%-NYHA Class I CHF,  HLD on multiple medications -> most recent TC 114, LDL 53, TG 154, HDL 37 HTN  DM-2-diet controlled.  A1c 5.87 08/2021 Long-term smoker-now fully quit after several prolonged attempts. Mild associated COPD Erectile dysfunction-PRN Viagra  Hendrick Schielke. was last seen on October 19, 2021 -> was doing amazingly well.  Had just returned from a trip from [place] as well as the [place].  He did not see the crazy worse amount pressure monuments.  He enjoyed hiking and was not bothered at all by the altitude.  No chest pain or dyspnea.  His endurance was good enough for him to keep up with his young adult children.  He was then planning a trip to [place] and then back to [place].  He was in great spirits.  Happy with no chest pain or pressure.  No PND orthopnea.  No arrhythmia symptoms.  Was working on  smoking cessation but has had slip ups off and on.  Says mild smoker's cough and wheezing.  Some joint pains but otherwise doing well.  Lipid panel looks good.  LDL was 53.  Recent Hospitalizations:  11/19/2021-cataract surgery 12/19-20/2023, [place] Parrish, ER: Lightheadedness with left hand numbness.  Normal blood pressure.  No slurred speech or weakness besides generalized weakness.  Some mild expressive aphasia.  Noted increased cough, fatigue.  Thought to have CAP with dehydration treated with doxycycline.  Reviewed  CV studies:    The following studies were reviewed today: (if available, images/films reviewed: From Epic Chart or Care Everywhere) None:  Interval History:   Troy Parrish. returns here today yet again doing well.  He has had several more trips out to [place] and [place].  He is about to go back again.  But the most recent DEXA prescription was because his mother passed away in March 17, 2022.  He is looking for to go back to [place] for happier reason to visit [facility].  He does state that his energy level is down a little bit but he is been dealing with a cold and the recent pneumonia back in December.  He got over that pretty well but still does have his energy back to normal.  Other than still try to recover energy level, he is doing wonderful without any active angina or heart failure symptoms.  No arrhythmia symptoms.  No syncope or near syncope.  CV Review of Symptoms (Summary):  no chest  pain or dyspnea on exertion positive for - mildly reduced exercise tolerance and fatigue try to recover from his pneumonia spell back in December.  Does not recover as quickly from his trips. negative for - edema, irregular heartbeat, orthopnea, palpitations, paroxysmal nocturnal dyspnea, rapid heart rate, shortness of breath, or syncope/near syncope or TIA/amaurosis fugax or claudication  REVIEWED OF SYSTEMS   Review of Systems  Constitutional:  Positive for malaise/fatigue (Just not as  energetic as he used to be). Negative for weight loss.  HENT:  Positive for congestion and sinus pain.        Seasonal allergies  Respiratory:  Positive for cough and wheezing. Negative for sputum production and shortness of breath.        Allergies  Gastrointestinal:  Negative for blood in stool and melena.  Genitourinary:  Negative for hematuria.  Musculoskeletal:  Positive for joint pain (Mild arthritis).  Neurological:  Negative for dizziness and focal weakness.  Endo/Heme/Allergies:  Positive for environmental allergies. Does not bruise/bleed easily.  Psychiatric/Behavioral:  Negative for depression (This is doing really well.) and memory loss. The patient does not have insomnia.     I have reviewed and (if needed) personally updated the patient's problem list, medications, allergies, past medical and surgical history, social and family history.   PAST MEDICAL HISTORY   Past Medical History:  Diagnosis Date  . Bronchitis    Gets bronchitis almost every year  . CAD S/P percutaneous coronary angioplasty 06/2011; 6/ & 11/2013   S/P PCI to all 3 major vessels;a)  Ant STEMI 2001- BMS-LAD x2 -->(redo PCI 6/'15 -- pLAD Xience DES 2.5 x 12- 2.75 mm, mLAD 2.25 x 12 - 2.7 mm), b) '06 UA --> Cx- OM DES; c) 2013 Inf MI BMS mRCA --> d) 10/'15 PCI dRCA Promus P DES 4.0 x 16 (4.25 mm); PTCA of RPL2 (2.0 mm) &RPDA (2.25 mm) 11/2013; 4/18 PCI OM1 Synergy DES  2.5 x 16  . COPD (chronic obstructive pulmonary disease) ([place])   . Elevated WBC count   . Emphysema of lung ([place])   . Essential hypertension 07/07/2011  . Former heavy tobacco smoker     quit in May 2015 after multiple attempts at trying to quit before   . GERD (gastroesophageal reflux disease)   . Glucose intolerance (pre-diabetes) June 2015    hemoglobin A1c 6.6  . Headache    "Imdur related; stopped taking it; headaches went away" (11/15/2013)  . Heart murmur   . History of colon polyps   . History of stomach ulcers   . Hyperlipidemia  with target LDL less than 70 07/07/2011  . Metabolic syndrome    Pre-diabetes, hypertension and truncal obesity as well as dyslipidemia  . OSA on CPAP   . Pneumonia "several times"  . Recurrent upper respiratory infection (URI)   . Seizures ([place]) "several"   "last one was ~ 2011" (11/15/2013)  . Shortness of breath dyspnea   . ST elevation myocardial infarction (STEMI) of anterior wall ([place]) 2001   History of -- 2 stents in early and distal mid LAD; prior cardiologist was Dr. Remi Haggard in [place]  . ST elevation myocardial infarction (STEMI) of inferior wall ([place]) 07/07/2011   Occluded RCA [id number] INTEGRITY; Echo 07/2013: EF 45-50%, Inferior & Posterolateral HK.   Marland Kitchen Umbilical hernia   . Unstable angina ([place]) 2006; 07/2013   Cx-OM - PCI 2.5 mm 13 mm Cypher DES [name], [place]) ; 07/2013: Severe ISR of both prox & Distal LAD stentS -->  2 DES stents (1 at prox edge of the proximal stent, 2nd covers the entire distal stent as well as proximal and distal edge stenose)   . Upper respiratory infection     PAST SURGICAL HISTORY   Past Surgical History:  Procedure Laterality Date  . BIOPSY N/A 06/30/2020   Procedure: BIOPSY;  Surgeon: Lucilla Lame, MD;  Location: [place];  Service: Endoscopy;  Laterality: N/A;  . Cardiac MRI [facility]  09/2014   EF 51%. Mod HK of basal-mid Inf wall, mild HK of basal inferoseptum & basal-mid inferolateral wall with hyper enhancement (c/w subendocard RCA MI with significant viability), focal hyper enhancement of apical wall c/w dLAD subendocard MI & complete LAD viability. . No aortic stenosis. Normal RV function. No Ischemia on Adenosine Stress.  Marland Kitchen CATARACT EXTRACTION W/PHACO Left 11/19/2021   Procedure: CATARACT EXTRACTION PHACO AND INTRAOCULAR LENS PLACEMENT ([place]) LEFT vivity lens;  Surgeon: Norvel Richards, MD;  Location: [place];  Service: Ophthalmology;  Laterality: Left;  11.32 1:32.6  . COLONOSCOPY WITH PROPOFOL N/A 03/28/2015    Procedure: COLONOSCOPY WITH PROPOFOL;  Surgeon: Lucilla Lame, MD;  Location: [place];  Service: Endoscopy;  Laterality: N/A;  . COLONOSCOPY WITH PROPOFOL N/A 06/30/2020   Procedure: COLONOSCOPY WITH PROPOFOL;  Surgeon: Lucilla Lame, MD;  Location: [place];  Service: Endoscopy;  Laterality: N/A;  . CORONARY ANGIOPLASTY WITH STENT PLACEMENT  2001   ANTERIOR mi with a  PCI to LAD; [place], [place] - Dr. Dorris Fetch  . CORONARY ANGIOPLASTY WITH STENT PLACEMENT  2006   [place] by Dr Dorris Fetch -lesion lft circ/OM1 with 2.5 x 25mm Cypher DES  . CORONARY STENT INTERVENTION N/A 05/19/2016   Procedure: Coronary Stent Intervention;  Surgeon: Leonie Man, MD;  Location: [place] CV [facility];  Service: Cardiovascular: PCI pOM1 - Synergy DES 2.5 x 16 (overlaps old stent)  . ESOPHAGOGASTRODUODENOSCOPY (EGD) WITH PROPOFOL N/A 03/28/2015   Procedure: ESOPHAGOGASTRODUODENOSCOPY (EGD) WITH PROPOFOL;  Surgeon: Lucilla Lame, MD;  Location: [place];  Service: Endoscopy;  Laterality: N/A;  . ESOPHAGOGASTRODUODENOSCOPY (EGD) WITH PROPOFOL N/A 06/30/2020   Procedure: ESOPHAGOGASTRODUODENOSCOPY (EGD) WITH PROPOFOL;  Surgeon: Lucilla Lame, MD;  Location: [place];  Service: Endoscopy;  Laterality: N/A;  . ESOPHAGOGASTRODUODENOSCOPY (EGD) WITH PROPOFOL N/A 09/11/2020   Procedure: ESOPHAGOGASTRODUODENOSCOPY (EGD) WITH PROPOFOL;  Surgeon: Lucilla Lame, MD;  Location: [place];  Service: Endoscopy;  Laterality: N/A;  . INSERTION OF MESH N/A 04/02/2015   Procedure: INSERTION OF MESH;  Surgeon: Florene Glen, MD;  Location: [facility];  Service: General;  Laterality: N/A;  . LEFT AND RIGHT HEART CATHETERIZATION WITH CORONARY ANGIOGRAM N/A 07/30/2013   Procedure: LEFT AND RIGHT HEART CATHETERIZATION WITH CORONARY ANGIOGRAM;  Surgeon: Leonie Man, MD;  Location: [facility] CATH LAB;  Service: Cardiovascular;  2 separate P & mLAD lesions -> PCI, MOd-Severe dRCA disesase with diffuse RPL/PDA  disease (FFR)  . LEFT HEART CATH  08/03/2013   Procedure: LEFT HEART CATH;  Surgeon: Leonie Man, MD;  Location: [facility] CATH LAB;  Service: Cardiovascular;;FFR of RCA - non-flow-limiting  . LEFT HEART CATH AND CORONARY ANGIOGRAPHY N/A 05/19/2016   Procedure: Left Heart Cath and Coronary Angiography;  Surgeon: Leonie Man, MD;  Location: [place] [facility];  Service: Cardiovascular: CULPRIT - 85% pOM1 pre-stent. Stents in both proximal and distal RCA widely patent. PTCA sites in RPL & PDA widely patent with stable 70% small RPL 2. Extensive stenting in the LAD widely patent stents  with mild to mod Dz btw prox & mid-distal stents.   Marland Kitchen LEFT HEART CATH AND CORONARY ANGIOGRAPHY N/A 02/13/2018   Procedure: LEFT HEART CATH AND CORONARY ANGIOGRAPHY;  Surgeon: Leonie Man, MD;  Location: [place] CV [facility];  Service: Cardiovascular: Similar findings to last cath post PCI.  Stents are patent.  Roughly 50% proximal LAD lesion noted, may be progression from prior cath but otherwise stable disease.  Marland Kitchen LEFT HEART CATHETERIZATION WITH CORONARY ANGIOGRAM N/A 07/07/2011   Procedure: LEFT HEART CATHETERIZATION WITH CORONARY ANGIOGRAM;  Surgeon: Leonie Man, MD;  Location: [facility] CATH LAB;  Service: Cardiovascular;  inferolat post LV infarct ST-elevation MI  . MET/CPET  11/09/2011   submax. effort 1.05 RER peak V02 was 51% ,chronotropic incomp.hrt rate lows 80s to 100  . MET/CPET  September 2016   [facility]: Mild functional impairment due primarily to mild pulmonary and circulatory limitations. Also suggest physical deconditioning (seen with low-normal aerobic reserve). Mild VQ mismatch with exercise.  Ventilatory reserve exhausted with peak exercise demonstrated pulmonary limitation. No significant decrease in postexercise FEV1 compared to rest --> suggest no exercise induced bronchospasm  . NM MYOVIEW ([place] HX)  11/30/2011   EF 45% ,exercise 10 METS, infarct\scar w mild perinfarct ischemia -basal inferolat and mid  inferolat region  . NM MYOVIEW LTD  04/25/2016   Exercised for 9 minutes. Reached 90% max. Heart rate. 9.4 METS. Read as a low risk study with normal EF 55-65%. -> on Review -  there does appear to be a small to moderate sized, medium severtiy reversible perfusion defect in the anterior wall - read by computer, but not noted by reader.  Marland Kitchen PERCUTANEOUS CORONARY STENT INTERVENTION (PCI-S)  07/07/2011   Procedure: PERCUTANEOUS CORONARY STENT INTERVENTION (PCI-S);  Surgeon: Leonie Man, MD;  Location: [facility] CATH LAB;  Service: Cardiovascular;;occluded RCA ,Integrity Resolute DES 2.75 x 18 mm stent - post-dilated 3.1 mm  . PERCUTANEOUS CORONARY STENT INTERVENTION (PCI-S)  07/30/2013   Procedure: PERCUTANEOUS CORONARY STENT INTERVENTION (PCI-S);  Surgeon: Leonie Man, MD;  Location: [facility] CATH LAB;  Service: Cardiovascular;;Distal mid LAD: Xience Alpine DES 2.25 mm x 28 mm (overlapping proximal and distal edge of previous stent) - 2.7 mm; proximal LAD 2.5 mm x 12 mm Xience Alpine DES (2.8 mm);; RCA 60-70% stenosis planned staged procedure  . PERCUTANEOUS CORONARY STENT INTERVENTION (PCI-S) N/A 11/15/2013   Procedure: PERCUTANEOUS CORONARY STENT INTERVENTION (PCI-S);  Surgeon: Leonie Man, MD;  [facility];  Service: Cardiovascular;  Patent Cx & LAD Stents; PCI -dRCA Promus Premier DES 4.0 mm x 16 mm (4.25 mm) dRCA, 2.0 mm Cutting PTCA of RPL2 [place] & POBA of mRPDA  . POLYPECTOMY  03/28/2015   Procedure: POLYPECTOMY;  Surgeon: Lucilla Lame, MD;  Location: [place];  Service: Endoscopy;;  . POLYPECTOMY N/A 06/30/2020   Procedure: POLYPECTOMY;  Surgeon: Lucilla Lame, MD;  Location: [place];  Service: Endoscopy;  Laterality: N/A;  . RIGHT HEART CATH  June 2015   Normal pressures with severely reduced cardiac output and index. (3.25/1.55)  . TRANSTHORACIC ECHOCARDIOGRAM  07/31/2013; February 2017   a. LVEF 45-50. Inferior posterior hypokinesis with mild dilation. Apparent  normal diastolic pressures;   . UMBILICAL HERNIA REPAIR N/A 04/02/2015   Procedure: HERNIA REPAIR UMBILICAL ADULT;  Surgeon: Florene Glen, MD;  Location: [facility];  Service: General;  Laterality: N/A;    MEDICATIONS/ALLERGIES   Current Meds  Medication Sig  . albuterol (VENTOLIN HFA) 108 (90 Base)  MCG/ACT inhaler INHALE TWO PUFFS BY MOUTH EVERY 6 HOURS AS NEEDED FOR SHORTNESS OF BREATH  . blood glucose meter kit and supplies Dispense based on patient and insurance preference. Use up to four times daily as directed.   . clobetasol cream (TEMOVATE) [id number] % Apply 1 Application topically daily as needed (eczema).  . EFFIENT 10 MG TABS tablet Take 1 tablet (10 mg total) by mouth daily.  . fenofibrate (TRICOR) 48 MG tablet TAKE ONE TABLET BY MOUTH DAILY  . fluticasone (FLONASE) 50 MCG/ACT nasal spray Place 2 sprays into both nostrils daily.  . fluticasone-salmeterol (ADVAIR) 100-50 MCG/ACT AEPB INHALE ONE PUFF BY MOUTH EVERY MORNING AND EVERY NIGHT AT BEDTIME  . ketoconazole (NIZORAL) 2 % shampoo Apply 1 Application topically 2 (two) times a week.  . Lancets (ONETOUCH DELICA PLUS [id number]) MISC USE TO TEST BLOOD SUGAR UP TO 4 TIMES DAILY  . metoprolol succinate (TOPROL-XL) 25 MG 24 hr tablet TAKE 1 AND 1/2 TABLET BY MOUTH EVERY EVENING  . nitroGLYCERIN (NITROSTAT) 0.4 MG SL tablet Place 1 tablet (0.4 mg total) under the tongue every 5 (five) minutes as needed for chest pain.  . Omega-3 Fatty Acids (FISH OIL) 1000 MG CPDR Take 3 g by mouth daily.  Marland Kitchen omeprazole (PRILOSEC) 40 MG capsule Take 1 capsule (40 mg total) by mouth daily.  Glory Rosebush ULTRA test strip USE TO TEST BLOOD SUGAR UP TO FOUR TIMES DAILY.  . rosuvastatin (CRESTOR) 40 MG tablet TAKE ONE TABLET BY MOUTH DAILY  . sildenafil (VIAGRA) 50 MG tablet Take 25 to 50 mg tablet  as needed for erectile dysfunction    Allergies  Allergen Reactions  . Niacin And Related Hives and Itching    SOCIAL HISTORY/FAMILY HISTORY   Reviewed in  Epic:  Pertinent findings:  Social History   Tobacco Use  . Smoking status: Every Day    Packs/day: 0.10    Years: 40.00    Additional pack years: 0.00    Total pack years: 4.00    Types: Cigarettes  . Smokeless tobacco: Never  . Tobacco comments:    Quit with Bupropion 150mg  - > unfortunately, he restarted; has cut all the way down to maybe 1 or 2 cigarettes a day, some days he goes without any.-Some days even multiple days without cigarettes.  Vaping Use  . Vaping Use: Never used  Substance Use Topics  . Alcohol use: Yes    Comment: rare  . Drug use: No   Social History   Social History Narrative   He is a married father of 45, grandfather of 69. He does not really get routine exercise.    He is down to 1-2 cigarettes a day-sometimes going more than 2 to 3 days without 1.. He is very seriously wanting to quit.     He has a social alcoholic beverage every now and then.       He is no longer working, medically retired essentially.  His wife still works.  He and his wife enjoy traveling have done multiple trips and vacations over the last couple years including several trips to [place] to be near her family.      Late summer 2023-2-week trip to [place] [facility], and then drove up to the [employer] including seeing [place] and [place].  Lots of walking and hiking.    OBJCTIVE -PE, EKG, labs   Wt Readings from Last 3 Encounters:  04/30/22 177 lb 3.2 oz (80.4 kg)  02/26/22  177 lb (80.3 kg)  01/26/22 180 lb (81.6 kg)    Physical Exam: BP 106/70   Pulse 85   Ht 5\' 11"  (1.803 m)   Wt 177 lb 3.2 oz (80.4 kg)   SpO2 93%   BMI 24.71 kg/m  Physical Exam Vitals reviewed.  Constitutional:      General: He is not in acute distress.    Appearance: Normal appearance. He is normal weight. He is not ill-appearing or toxic-appearing.  HENT:     Head: Normocephalic and atraumatic.     Nose: Congestion present.  Eyes:     Extraocular Movements: Extraocular  movements intact.     Pupils: Pupils are equal, round, and reactive to light.     Comments: Mild conjunctival injection from allergies.  Cardiovascular:     Rate and Rhythm: Normal rate and regular rhythm. No extrasystoles are present.    Chest Wall: PMI is not displaced.     Pulses: Normal pulses and intact distal pulses.     Heart sounds: S1 normal and S2 normal. Heart sounds are distant. No murmur heard.    No friction rub. No gallop.  Pulmonary:     Effort: Pulmonary effort is normal. No respiratory distress.     Breath sounds: Wheezing (Faint wheeze with forced expiration) present. No rhonchi or rales.  Chest:     Chest wall: No tenderness.  Musculoskeletal:        General: No swelling. Normal range of motion.  Skin:    General: Skin is warm and dry.  Neurological:     General: No focal deficit present.     Mental Status: He is alert and oriented to person, place, and time. Mental status is at baseline.     Gait: Gait normal.  Psychiatric:        Mood and Affect: Mood normal.        Behavior: Behavior normal.        Thought Content: Thought content normal.        Judgment: Judgment normal.     Comments: In a great mood.     Adult ECG Report  Rate: 85 ;  Rhythm: normal sinus rhythm and rightward axis but otherwise normal intervals and durations.  No ischemic changes just flat nonspecific ST segments in inferior and lateral leads. ;   Narrative Interpretation: Stable  Recent Labs: Reviewed.  Due for new labs soon.  PCP usually checks lipids in roughly June July timeframe. Lab Results  Component Value Date   CHOL 114 08/14/2021   HDL 37 (L) 08/14/2021   LDLCALC 53 08/14/2021   LDLDIRECT 53.0 07/05/2018   TRIG 154 (H) 08/14/2021   CHOLHDL 3.1 08/14/2021   Lab Results  Component Value Date   CREATININE 1.68 (H) 01/26/2022   BUN 13 01/26/2022   NA 136 01/26/2022   K 4.0 01/26/2022   CL 103 01/26/2022   CO2 21 (L) 01/26/2022      Latest Ref Rng & Units 01/26/2022    10:36 PM 08/14/2021    8:08 AM 12/23/2020    9:59 AM  CBC  WBC 4.0 - 10.5 K/uL 15.4  11.6  13.1   Hemoglobin 13.0 - 17.0 g/dL 17.1  16.8  17.6   Hematocrit 39.0 - 52.0 % 51.4  50.5  53.1   Platelets 150 - 400 K/uL 363  370  421     Lab Results  Component Value Date   HGBA1C 5.8 (H) 08/14/2021   Lab Results  Component Value Date   TSH 2.330 10/12/2016    ================================================== I spent a total of 20 minutes with the patient spent in direct patient consultation.  Additional time spent with chart review  / charting (studies, outside notes, etc): 13 min Total Time: 33 min  Current medicines are reviewed at length with the patient today.  (+/- concerns) N/A  Notice: This dictation was prepared with Dragon dictation along with smart phrase technology. Any transcriptional errors that result from this process are unintentional and may not be corrected upon review.  Studies Ordered:   Orders Placed This Encounter  Procedures  . EKG 12-Lead   No orders of the defined types were placed in this encounter.   Patient Instructions / Medication Changes & Studies & Tests Ordered   Patient Instructions  Medication Instructions:  No changes   *If you need a refill on your cardiac medications before your next appointment, please call your pharmacy*   Lab Work: Not needed    Testing/Procedures:  Not needed  Follow-Up: At [facility], you and your health needs are our priority.  As part of our continuing mission to provide you with exceptional heart care, we have created designated Provider Care Teams.  These Care Teams include your primary Cardiologist (physician) and Advanced Practice Providers (APPs -  Physician Assistants and Nurse Practitioners) who all work together to provide you with the care you need, when you need it.     Your next appointment:   6 month(s)  The format for your next appointment:   In Person  Provider:   Glenetta Hew, MD       Leonie Man, MD, MS Glenetta Hew, M.D., M.S. Interventional Cardiologist  [facility]  Pager # [phone] Phone # [phone] [address] [place], [place] [address]   Thank you for choosing [facility]!!

## 2022-05-10 NOTE — Assessment & Plan Note (Signed)
Mild Ischemic CM with EF about 40 to 45%.  Stable NYHA class I symptoms.  Euvolemic on exam.  He is only on moderate-dose Toprol--increased to 37.5 mg daily because of ectopy.  BP is borderline low so would not add ARB at this time.  No need for diuretic.

## 2022-05-11 NOTE — Assessment & Plan Note (Signed)
Not really hypertensive anymore on modest dose of Toprol.  We discontinued ARB in the past because hypotension.  Hold off for now.

## 2022-05-11 NOTE — Assessment & Plan Note (Signed)
Managed by PCP he has albuterol which is really helping his allergies at this point.  He also takes Advair and Flonase.

## 2022-05-11 NOTE — Assessment & Plan Note (Signed)
His initial MI was over 14 years ago back in [place] with LAD stent.  He subsequently had additional stents to the LAD. Despite all of his PCI MIs, his EF still remains relatively preserved at 45 to 50%.  Anterior wall still moving well.

## 2022-05-11 NOTE — Assessment & Plan Note (Signed)
Extensive PCI 3 months of every single vessel distribution.  No further angina since his last PCI on stable dose of beta-blocker.  He is very active now, more so that he had been for the first several years that I knew him.    Stable regimen: On lifelong Thienopyridine currently using Effient because of intolerance to Brilinta and nonresponse to Plavix. On modest dose beta-blocker and high-dose rosuvastatin.  He is also on fenofibrate for additional lipid management. BP not high enough to consider ARB. Smoking cessation counseling

## 2022-05-11 NOTE — Assessment & Plan Note (Signed)
He has had inferior MI's involving the RCA as well as circumflex. It is the inferior wall that is mildly hypokinetic.  But no active anginal or heart failure symptoms.

## 2022-05-11 NOTE — Assessment & Plan Note (Signed)
At this point noted to have a class I angina on current dose of Toprol since his last PCI.

## 2022-05-11 NOTE — Assessment & Plan Note (Signed)
As noted.  Stable.  Euvolemic.  No diuretic.  BP and does not warrant ARB

## 2022-05-11 NOTE — Assessment & Plan Note (Addendum)
Lipids not checked his last July.  They were excellent at that time with LDL 53.  Continue current dose of rosuvastatin 40 mg daily and fenofibrate 48 mg along with fish oil 3 g daily.  Last A1c was 5.8, he is no longer on any diabetes medications.  He has made major dietary changes and is much more active.  Has lost weight and eating healthy.  Has now come off of all medications.

## 2022-05-11 NOTE — Assessment & Plan Note (Signed)
He is still not able to quite shake the cigarettes, but is getting closer.  We keep talking about it.  He desperately wants to and is working on it.

## 2022-05-14 ENCOUNTER — Other Ambulatory Visit: Payer: Self-pay | Admitting: Cardiology

## 2022-05-14 ENCOUNTER — Other Ambulatory Visit: Payer: Self-pay | Admitting: Internal Medicine

## 2022-05-14 DIAGNOSIS — I5042 Chronic combined systolic (congestive) and diastolic (congestive) heart failure: Secondary | ICD-10-CM

## 2022-05-14 NOTE — Telephone Encounter (Signed)
Patient will need an office visit for further refills. Requested Prescriptions  Pending Prescriptions Disp Refills   fluticasone-salmeterol (ADVAIR) 100-50 MCG/ACT AEPB [Pharmacy Med Name: FLUTICASONE-SALMETEROL 100-50] 180 each 0    Sig: INHALE 1 PUFF BY MOUTH TWICE A DAY IN THE MORNING AND IN THE EVENING     Pulmonology:  Combination Products Passed - 05/14/2022  8:13 AM      Passed - Valid encounter within last 12 months    Recent Outpatient Visits           2 months ago Impacted cerumen of right ear   [facility] [place], Netta Neat, DO   4 months ago Viral URI with cough   [place] [facility] [place], Salvadore Oxford, NP   9 months ago Encounter for general adult medical examination with abnormal findings   [facility] [place], Salvadore Oxford, NP   1 year ago Type 2 diabetes mellitus with complication [facility])   [facility] [place], Salvadore Oxford, NP       Future Appointments             In 5 months Herbie Baltimore, Piedad Climes, MD [facility]

## 2022-06-14 ENCOUNTER — Other Ambulatory Visit: Payer: Self-pay | Admitting: Internal Medicine

## 2022-06-14 ENCOUNTER — Other Ambulatory Visit (HOSPITAL_COMMUNITY): Payer: Self-pay

## 2022-06-14 MED ORDER — ALBUTEROL SULFATE HFA 108 (90 BASE) MCG/ACT IN AERS: INHALATION_SPRAY | RESPIRATORY_TRACT | 1 refills | Status: DC

## 2022-06-15 ENCOUNTER — Encounter: Payer: Self-pay | Admitting: Internal Medicine

## 2022-06-15 ENCOUNTER — Ambulatory Visit (INDEPENDENT_AMBULATORY_CARE_PROVIDER_SITE_OTHER): Payer: PPO | Admitting: Internal Medicine

## 2022-06-15 VITALS — BP 122/84 | HR 84 | Temp 96.0°F | Wt 177.0 lb

## 2022-06-15 DIAGNOSIS — I255 Ischemic cardiomyopathy: Secondary | ICD-10-CM

## 2022-06-15 DIAGNOSIS — K2100 Gastro-esophageal reflux disease with esophagitis, without bleeding: Secondary | ICD-10-CM

## 2022-06-15 DIAGNOSIS — R252 Cramp and spasm: Secondary | ICD-10-CM

## 2022-06-15 DIAGNOSIS — I2089 Other forms of angina pectoris: Secondary | ICD-10-CM

## 2022-06-15 DIAGNOSIS — I252 Old myocardial infarction: Secondary | ICD-10-CM

## 2022-06-15 DIAGNOSIS — I25119 Atherosclerotic heart disease of native coronary artery with unspecified angina pectoris: Secondary | ICD-10-CM | POA: Diagnosis not present

## 2022-06-15 DIAGNOSIS — I10 Essential (primary) hypertension: Secondary | ICD-10-CM

## 2022-06-15 DIAGNOSIS — E118 Type 2 diabetes mellitus with unspecified complications: Secondary | ICD-10-CM | POA: Diagnosis not present

## 2022-06-15 DIAGNOSIS — J439 Emphysema, unspecified: Secondary | ICD-10-CM

## 2022-06-15 DIAGNOSIS — G4733 Obstructive sleep apnea (adult) (pediatric): Secondary | ICD-10-CM

## 2022-06-15 DIAGNOSIS — E1169 Type 2 diabetes mellitus with other specified complication: Secondary | ICD-10-CM | POA: Diagnosis not present

## 2022-06-15 DIAGNOSIS — I5032 Chronic diastolic (congestive) heart failure: Secondary | ICD-10-CM | POA: Diagnosis not present

## 2022-06-15 DIAGNOSIS — N522 Drug-induced erectile dysfunction: Secondary | ICD-10-CM

## 2022-06-15 DIAGNOSIS — N1831 Chronic kidney disease, stage 3a: Secondary | ICD-10-CM

## 2022-06-15 DIAGNOSIS — N183 Chronic kidney disease, stage 3 unspecified: Secondary | ICD-10-CM | POA: Insufficient documentation

## 2022-06-15 DIAGNOSIS — E785 Hyperlipidemia, unspecified: Secondary | ICD-10-CM

## 2022-06-15 DIAGNOSIS — I700 Atherosclerosis of aorta: Secondary | ICD-10-CM | POA: Diagnosis not present

## 2022-06-15 DIAGNOSIS — N529 Male erectile dysfunction, unspecified: Secondary | ICD-10-CM | POA: Insufficient documentation

## 2022-06-15 LAB — POCT GLYCOSYLATED HEMOGLOBIN (HGB A1C): Hemoglobin A1C: 6.1 % — AB (ref 4.0–5.6)

## 2022-06-15 NOTE — Assessment & Plan Note (Signed)
Controlled on metoprolol Reinforced DASH diet and exercise for weight loss 

## 2022-06-15 NOTE — Assessment & Plan Note (Signed)
Continue metoprolol and aspirin He will continue to follow with cardiology

## 2022-06-15 NOTE — Assessment & Plan Note (Signed)
C-Met today 

## 2022-06-15 NOTE — Patient Instructions (Signed)

## 2022-06-15 NOTE — Assessment & Plan Note (Signed)
Compensated Continue metoprolol Encourage DASH diet Monitor daily weights

## 2022-06-15 NOTE — Assessment & Plan Note (Signed)
Noncompliant with CPAP 

## 2022-06-15 NOTE — Assessment & Plan Note (Signed)
C-Met and lipid profile today Encouraged him to consume a low-fat diet Continue rosuvastatin, fenofibrate, metoprolol, Effient and aspirin

## 2022-06-15 NOTE — Assessment & Plan Note (Signed)
C-Met and lipid profile today Encouraged him to consume a low-fat diet Continue rosuvastatin and fenofibrate 

## 2022-06-15 NOTE — Assessment & Plan Note (Signed)
Continue sildenafil as needed. 

## 2022-06-15 NOTE — Assessment & Plan Note (Signed)
POCT A1c 6.1% We will check urine microalbumin Encouraged him to consume a low-carb diet Encouraged routine eye exam Encouraged routine foot exam Immunizations UTD

## 2022-06-15 NOTE — Assessment & Plan Note (Signed)
C-Met and lipid profile today Encouraged him to consume a low-fat diet Continue rosuvastatin, fenofibrate, metoprolol, Effient and aspirin 

## 2022-06-15 NOTE — Assessment & Plan Note (Signed)
C-Met and lipid profile today Encouraged him to consume a low-fat diet Continue rosuvastatin, fenofibrate, Effient and aspirin

## 2022-06-15 NOTE — Progress Notes (Signed)
Subjective:    Patient ID: Troy Parrish., male    DOB: 1960/05/23, 62 y.o.   MRN: [id number]  HPI  Patient presents to clinic today for follow-up of chronic conditions.  HTN: His BP today is 122/84.  He is taking Metoprolol as prescribed.  ECG from 04/2022 reviewed.  HLD with CAD/Angina status post MI: Status post stents.  His last LDL was 53, triglycerides 829, 08/2021.  He is taking Rosuvastatin, Fenofibrate, Metoprolol, Effient and Aspirin as prescribed.  He follows with cardiology.  CHF with Cardiomyopathy: He denies chronic cough, shortness of breath or lower extremity edema.  He is taking Metoprolol as prescribed.  He is not currently on any diuretics.  Echo from 03/2015 reviewed.  OSA: He averages hours of sleep per night without the use of his CPAP.  Sleep study from 04/2013 reviewed.  GERD: He denies breakthrough on Omeprazole.  Upper GI from 06/2020 reviewed.  Leukocytosis/Polycythemia: His last WBC count was 15.4, H/H17.1/51.9, 01/2022.  He does smoke.  He does not follow with hematology.  CKD 3: His last creatinine was 1.68, GFR 48, 01/2022.  He is not currently on an ACEI/ARB.  He does not follow with nephrology.  COPD: He denies chronic cough or shortness of breath.  He is using Advair as prescribed and Albuterol as needed.  PFTs from 04/2013 reviewed.  DM2: His last A1c was 5.8%, 08/2021.  His sugar ranges < 150 He is not taking any oral diabetic medication at this time.  He checks his feet routinely.  His last eye exam was 09/2021.  Flu 11/2021.  Pneumovax 12/2018.  Prevnar 13 04/2020.  COVID x 4.  ED: Managed with sSldenafil.  He does not follow with urology.  He also reports cramping in his left hand.  He noticed this a few months ago but seems to be occurring more frequently.  He reports his entire left hand will tighten up and he will have difficulty bending his second third and fourth fingers.  He reports some intermittent numbness and tingling.  He has not tried anything  OTC for this.    Review of Systems  Past Medical History:  Diagnosis Date   Bronchitis    Gets bronchitis almost every year   CAD S/P percutaneous coronary angioplasty 06/2011; 6/ & 11/2013   S/P PCI to all 3 major vessels;a)  Ant STEMI 2001- BMS-LAD x2 -->(redo PCI 6/'15 -- pLAD Xience DES 2.5 x 12- 2.75 mm, mLAD 2.25 x 12 - 2.7 mm), b) '06 UA --> Cx- OM DES; c) 2013 Inf MI BMS mRCA --> d) 10/'15 PCI dRCA Promus P DES 4.0 x 16 (4.25 mm); PTCA of RPL2 (2.0 mm) &RPDA (2.25 mm) 11/2013; 4/18 PCI OM1 Synergy DES  2.5 x 16   COPD (chronic obstructive pulmonary disease) (HCC)    Elevated WBC count    Emphysema of lung (HCC)    Essential hypertension 07/07/2011   Former heavy tobacco smoker     quit in May 2015 after multiple attempts at trying to quit before    GERD (gastroesophageal reflux disease)    Glucose intolerance (pre-diabetes) June 2015    hemoglobin A1c 6.6   Headache    "Imdur related; stopped taking it; headaches went away" (11/15/2013)   Heart murmur    History of colon polyps    History of stomach ulcers    Hyperlipidemia with target LDL less than 70 07/07/2011   Metabolic syndrome    Pre-diabetes, hypertension and truncal obesity  as well as dyslipidemia   OSA on CPAP    Pneumonia "several times"   Recurrent upper respiratory infection (URI)    Seizures (HCC) "several"   "last one was ~ 2011" (11/15/2013)   Shortness of breath dyspnea    ST elevation myocardial infarction (STEMI) of anterior wall (HCC) 2001   History of -- 2 stents in early and distal mid LAD; prior cardiologist was Dr. Derwood Kaplan in [place]   ST elevation myocardial infarction (STEMI) of inferior wall (HCC) 07/07/2011   Occluded RCA 2.75X18 INTEGRITY; Echo 07/2013: EF 45-50%, Inferior & Posterolateral HK.    Umbilical hernia    Unstable angina (HCC) 2006; 07/2013   Cx-OM - PCI 2.5 mm 13 mm Cypher DES Jinny Blossom, [place]) ; 07/2013: Severe ISR of both prox & Distal LAD stentS --> 2 DES stents (1 at prox  edge of the proximal stent, 2nd covers the entire distal stent as well as proximal and distal edge stenose)    Upper respiratory infection     Current Outpatient Medications  Medication Sig Dispense Refill   albuterol (VENTOLIN HFA) 108 (90 Base) MCG/ACT inhaler INHALE TWO PUFFS BY MOUTH EVERY 6 HOURS AS NEEDED FOR SHORTNESS OF BREATH 25.5 g 1   blood glucose meter kit and supplies Dispense based on patient and insurance preference. Use up to four times daily as directed. (FOR ICD-10 E10.9, E11.9). 1 each 0   clobetasol cream (TEMOVATE) 0.05 % Apply 1 Application topically daily as needed (eczema). 30 g 0   EFFIENT 10 MG TABS tablet Take 1 tablet (10 mg total) by mouth daily. 90 tablet 3   fenofibrate (TRICOR) 48 MG tablet TAKE ONE TABLET BY MOUTH DAILY 90 tablet 1   fluticasone (FLONASE) 50 MCG/ACT nasal spray Place 2 sprays into both nostrils daily. 16 g 0   fluticasone-salmeterol (ADVAIR) 100-50 MCG/ACT AEPB INHALE 1 PUFF BY MOUTH TWICE A DAY IN THE MORNING AND IN THE EVENING 180 each 0   ketoconazole (NIZORAL) 2 % shampoo Apply 1 Application topically 2 (two) times a week. 120 mL 0   Lancets (ONETOUCH DELICA PLUS LANCET33G) MISC USE TO TEST BLOOD SUGAR UP TO 4 TIMES DAILY 100 each 3   metoprolol succinate (TOPROL-XL) 25 MG 24 hr tablet TAKE 1 AND 1/2 TABLET BY MOUTH EVERY EVENING 135 tablet 2   nitroGLYCERIN (NITROSTAT) 0.4 MG SL tablet Place 1 tablet (0.4 mg total) under the tongue every 5 (five) minutes as needed for chest pain. 25 tablet 11   Omega-3 Fatty Acids (FISH OIL) 1000 MG CPDR Take 3 g by mouth daily. 30 capsule 11   omeprazole (PRILOSEC) 40 MG capsule Take 1 capsule (40 mg total) by mouth daily. 90 capsule 0   ONETOUCH ULTRA test strip USE TO TEST BLOOD SUGAR UP TO FOUR TIMES DAILY. 200 strip 1   rosuvastatin (CRESTOR) 40 MG tablet TAKE ONE TABLET BY MOUTH DAILY 90 tablet 3   sildenafil (VIAGRA) 50 MG tablet Take 25 to 50 mg tablet  as needed for erectile dysfunction 10 tablet 6    No current facility-administered medications for this visit.    Allergies  Allergen Reactions   Niacin And Related Hives and Itching    Family History  Problem Relation Age of Onset   Coronary artery disease Father    Heart disease Father    Stroke Father    Hypertension Father    Hyperlipidemia Mother    Diabetes Mother    Hyperlipidemia Maternal Grandmother  Diabetes Maternal Grandmother    Hyperlipidemia Maternal Grandfather    Diabetes Maternal Grandfather    Emphysema Maternal Grandfather    Heart disease Paternal Grandmother    Heart disease Paternal Grandfather    Cancer Maternal Aunt        breast    Social History   Socioeconomic History   Marital status: Married    Spouse name: Not on file   Number of children: 8   Years of education: Not on file   Highest education level: Not on file  Occupational History   Not on file  Tobacco Use   Smoking status: Every Day    Packs/day: 0.10    Years: 40.00    Additional pack years: 0.00    Total pack years: 4.00    Types: Cigarettes   Smokeless tobacco: Never   Tobacco comments:    Quit with Bupropion 150mg  - > unfortunately, he restarted; has cut all the way down to maybe 1 or 2 cigarettes a day, some days he goes without any.-Some days even multiple days without cigarettes.  Vaping Use   Vaping Use: Never used  Substance and Sexual Activity   Alcohol use: Yes    Comment: rare   Drug use: No   Sexual activity: Not on file  Other Topics Concern   Not on file  Social History Narrative   He is a married father of 8, grandfather of 8. He does not really get routine exercise.    He is down to 1-2 cigarettes a day-sometimes going more than 2 to 3 days without 1.. He is very seriously wanting to quit.     He has a social alcoholic beverage every now and then.       He is no longer working, medically retired essentially.  His wife still works.  He and his wife enjoy traveling have done multiple trips and  vacations over the last couple years including several trips to [place] to be near her family.      Late summer 2023-2-week trip to [place] [facility], and then drove up to the [employer] including seeing [place] and [place].  Lots of walking and hiking.   Social Determinants of Health   Financial Resource Strain: Low Risk  (10/09/2021)   Overall Financial Resource Strain (CARDIA)    Difficulty of Paying Living Expenses: Not hard at all  Food Insecurity: No Food Insecurity (10/09/2021)   Hunger Vital Sign    Worried About Running Out of Food in the Last Year: Never true    Ran Out of Food in the Last Year: Never true  Transportation Needs: No Transportation Needs (10/09/2021)   PRAPARE - Administrator, Civil Service (Medical): No    Lack of Transportation (Non-Medical): No  Physical Activity: Insufficiently Active (10/09/2021)   Exercise Vital Sign    Days of Exercise per Week: 2 days    Minutes of Exercise per Session: 20 min  Stress: No Stress Concern Present (10/09/2021)   [employer] of Occupational Health - Occupational Stress Questionnaire    Feeling of Stress : Not at all  Social Connections: Moderately Integrated (10/09/2021)   Social Connection and Isolation Panel [NHANES]    Frequency of Communication with Friends and Family: More than three times a week    Frequency of Social Gatherings with Friends and Family: Once a week    Attends Religious Services: 1 to 4 times per year    Active Member  of Clubs or Organizations: No    Attends Banker Meetings: Never    Marital Status: Married  Catering manager Violence: Not At Risk (10/09/2021)   Humiliation, Afraid, Rape, and Kick questionnaire    Fear of Current or Ex-Partner: No    Emotionally Abused: No    Physically Abused: No    Sexually Abused: No     Constitutional: Denies fever, malaise, fatigue, headache or abrupt weight changes.  HEENT: Denies eye pain, eye redness, ear  pain, ringing in the ears, wax buildup, runny nose, nasal congestion, bloody nose, or sore throat. Respiratory: Denies difficulty breathing, shortness of breath, cough or sputum production.   Cardiovascular: Denies chest pain, chest tightness, palpitations or swelling in the hands or feet.  Gastrointestinal: Denies abdominal pain, bloating, constipation, diarrhea or blood in the stool.  GU: Patient reports erectile dysfunction.  Denies urgency, frequency, pain with urination, burning sensation, blood in urine, odor or discharge. Musculoskeletal: Patient reports left hand cramping.  Denies decrease in range of motion, difficulty with gait, or joint pain and swelling.  Skin: Denies redness, rashes, lesions or ulcercations.  Neurological: Denies dizziness, difficulty with memory, difficulty with speech or problems with balance and coordination.  Psych: Denies anxiety, depression, SI/HI.  No other specific complaints in a complete review of systems (except as listed in HPI above).     Objective:   Physical Exam  BP 122/84 (BP Location: Left Arm, Patient Position: Sitting, Cuff Size: Normal)   Pulse 84   Temp (!) 96 F (35.6 C) (Temporal)   Wt 177 lb (80.3 kg)   SpO2 96%   BMI 24.69 kg/m   Wt Readings from Last 3 Encounters:  04/30/22 177 lb 3.2 oz (80.4 kg)  02/26/22 177 lb (80.3 kg)  01/26/22 180 lb (81.6 kg)    General: Appears his stated age, well developed, well nourished in NAD. Skin: Warm, dry and intact. No ulcerations noted. HEENT: Head: normal shape and size; Eyes: sclera white, no icterus, conjunctiva pink, PERRLA and EOMs intact;  Cardiovascular: Normal rate and rhythm. S1,S2 noted.  No murmur, rubs or gallops noted. No JVD or BLE edema. No carotid bruits noted.  Pedal pulse 2+ on the left hand.  Cap refill <3 seconds on the left hand Pulmonary/Chest: Normal effort and positive vesicular breath sounds. No respiratory distress. No wheezes, rales or ronchi noted.  Abdomen:  Normal bowel sounds.  Musculoskeletal: Normal flexion, extension, rotation of the left wrist.  No signs of joint swelling.  Handgrips equal.  No difficulty with gait.  Neurological: Alert and oriented. Cranial nerves II-XII grossly intact. Coordination normal.  Psychiatric: Mood and affect normal. Behavior is normal. Judgment and thought content normal.     BMET    Component Value Date/Time   NA 136 01/26/2022 2236   NA 139 11/15/2019 1010   NA 139 10/24/2013 1050   K 4.0 01/26/2022 2236   K 3.8 10/24/2013 1050   CL 103 01/26/2022 2236   CL 107 10/24/2013 1050   CO2 21 (L) 01/26/2022 2236   CO2 24 10/24/2013 1050   GLUCOSE 129 (H) 01/26/2022 2236   GLUCOSE 108 (H) 10/24/2013 1050   BUN 13 01/26/2022 2236   BUN 16 11/15/2019 1010   BUN 8 10/24/2013 1050   CREATININE 1.68 (H) 01/26/2022 2236   CREATININE 0.86 08/14/2021 0808   CALCIUM 9.6 01/26/2022 2236   CALCIUM 8.2 (L) 10/24/2013 1050   GFRNONAA 46 (L) 01/26/2022 2236   GFRNONAA >60 10/24/2013 1050  GFRAA 81 11/15/2019 1010   GFRAA >60 10/24/2013 1050    Lipid Panel     Component Value Date/Time   CHOL 114 08/14/2021 0808   CHOL 141 11/15/2019 1010   TRIG 154 (H) 08/14/2021 0808   HDL 37 (L) 08/14/2021 0808   HDL 32 (L) 11/15/2019 1010   CHOLHDL 3.1 08/14/2021 0808   VLDL 18.2 05/05/2020 1304   LDLCALC 53 08/14/2021 0808    CBC    Component Value Date/Time   WBC 15.4 (H) 01/26/2022 2236   RBC 5.78 01/26/2022 2236   HGB 17.1 (H) 01/26/2022 2236   HGB 17.7 11/15/2019 1010   HCT 51.4 01/26/2022 2236   HCT 51.9 (H) 11/15/2019 1010   PLT 363 01/26/2022 2236   PLT 391 11/15/2019 1010   MCV 88.9 01/26/2022 2236   MCV 88 11/15/2019 1010   MCV 89 10/24/2013 1050   MCH 29.6 01/26/2022 2236   MCHC 33.3 01/26/2022 2236   RDW 14.8 01/26/2022 2236   RDW 13.0 11/15/2019 1010   RDW 14.5 10/24/2013 1050   LYMPHSABS 4.6 (H) 04/04/2015 1040   LYMPHSABS 2.8 11/07/2013 1509   MONOABS 0.9 04/04/2015 1040   EOSABS 0.1  04/04/2015 1040   EOSABS 0.2 11/07/2013 1509   BASOSABS 0.1 04/04/2015 1040   BASOSABS 0.1 11/07/2013 1509    Hgb A1C Lab Results  Component Value Date   HGBA1C 5.8 (H) 08/14/2021           Assessment & Plan:   Left Cramping:  Encouraged adequate water intake Will check potassium and magnesium Consider referral to hand specialist vs neurology for EMG testing  RTC in 6 months for annual exam Nicki Reaper, NP

## 2022-06-15 NOTE — Assessment & Plan Note (Signed)
Avoid foods that trigger reflux Continue omeprazole 

## 2022-06-15 NOTE — Assessment & Plan Note (Signed)
Encourage smoking cessation Continue on Advair and albuterol

## 2022-06-15 NOTE — Telephone Encounter (Signed)
Requested Prescriptions  Pending Prescriptions Disp Refills   ONETOUCH ULTRA test strip [Pharmacy Med Name: ONETOUCH ULTRA TEST STRIP] 200 strip 1    Sig: USE 1 STRIP TO TEST 4 TIMES A DAY     Endocrinology: Diabetes - Testing Supplies Passed - 06/14/2022 11:05 AM      Passed - Valid encounter within last 12 months    Recent Outpatient Visits           Today Aortic atherosclerosis [facility])   [facility] [place], Salvadore Oxford, NP   3 months ago Impacted cerumen of right ear   [place] [facility] Smitty Cords, DO   5 months ago Viral URI with cough   [place] [facility] [place], Salvadore Oxford, NP   10 months ago Encounter for general adult medical examination with abnormal findings   [place] [facility] [place], Salvadore Oxford, NP   1 year ago Type 2 diabetes mellitus with complication [facility])   [place] [facility] [place], Salvadore Oxford, NP       Future Appointments             In 4 months Herbie Baltimore, Piedad Climes, MD [facility] at [facility]   In 6 months [place], Salvadore Oxford, NP [place] [facility], [facility]

## 2022-06-16 LAB — CBC
HCT: 50.8 % — ABNORMAL HIGH (ref 38.5–50.0)
Hemoglobin: 16.6 g/dL (ref 13.2–17.1)
MCH: 29.7 pg (ref 27.0–33.0)
MCHC: 32.7 g/dL (ref 32.0–36.0)
MCV: 91 fL (ref 80.0–100.0)
MPV: 9.4 fL (ref 7.5–12.5)
Platelets: 368 10*3/uL (ref 140–400)
RBC: 5.58 10*6/uL (ref 4.20–5.80)
RDW: 13.4 % (ref 11.0–15.0)
WBC: 14.4 10*3/uL — ABNORMAL HIGH (ref 3.8–10.8)

## 2022-06-16 LAB — LIPID PANEL
Cholesterol: 122 mg/dL (ref <200)
HDL: 39 mg/dL — ABNORMAL LOW (ref >=40)
LDL Cholesterol (Calc): 60 mg/dL (calc)
Non-HDL Cholesterol (Calc): 83 mg/dL (calc) (ref <130)
Total CHOL/HDL Ratio: 3.1 (calc) (ref <5.0)
Triglycerides: 150 mg/dL — ABNORMAL HIGH (ref <150)

## 2022-06-16 LAB — COMPLETE METABOLIC PANEL WITH GFR
AG Ratio: 1.5 (calc) (ref 1.0–2.5)
ALT: 21 U/L (ref 9–46)
AST: 21 U/L (ref 10–35)
Albumin: 4.1 g/dL (ref 3.6–5.1)
Alkaline phosphatase (APISO): 92 U/L (ref 35–144)
BUN: 17 mg/dL (ref 7–25)
CO2: 21 mmol/L (ref 20–32)
Calcium: 9.5 mg/dL (ref 8.6–10.3)
Chloride: 108 mmol/L (ref 98–110)
Creat: 1.09 mg/dL (ref 0.70–1.35)
Globulin: 2.7 g/dL (calc) (ref 1.9–3.7)
Glucose, Bld: 135 mg/dL — ABNORMAL HIGH (ref 65–99)
Potassium: 4.5 mmol/L (ref 3.5–5.3)
Sodium: 139 mmol/L (ref 135–146)
Total Bilirubin: 0.5 mg/dL (ref 0.2–1.2)
Total Protein: 6.8 g/dL (ref 6.1–8.1)
eGFR: 77 mL/min/{1.73_m2} (ref >=60)

## 2022-06-16 LAB — MICROALBUMIN / CREATININE URINE RATIO
Creatinine, Urine: 398 mg/dL — ABNORMAL HIGH (ref 20–320)
Microalb Creat Ratio: 6 mg/g creat (ref <30)
Microalb, Ur: 2.2 mg/dL

## 2022-06-16 LAB — MAGNESIUM: Magnesium: 2 mg/dL (ref 1.5–2.5)

## 2022-06-24 DIAGNOSIS — R252 Cramp and spasm: Secondary | ICD-10-CM | POA: Diagnosis not present

## 2022-06-28 DIAGNOSIS — H9042 Sensorineural hearing loss, unilateral, left ear, with unrestricted hearing on the contralateral side: Secondary | ICD-10-CM | POA: Diagnosis not present

## 2022-06-28 DIAGNOSIS — H6063 Unspecified chronic otitis externa, bilateral: Secondary | ICD-10-CM | POA: Diagnosis not present

## 2022-06-28 DIAGNOSIS — H6121 Impacted cerumen, right ear: Secondary | ICD-10-CM | POA: Diagnosis not present

## 2022-06-28 DIAGNOSIS — H6983 Other specified disorders of Eustachian tube, bilateral: Secondary | ICD-10-CM | POA: Diagnosis not present

## 2022-07-09 DIAGNOSIS — M62838 Other muscle spasm: Secondary | ICD-10-CM | POA: Diagnosis not present

## 2022-07-09 DIAGNOSIS — M79645 Pain in left finger(s): Secondary | ICD-10-CM | POA: Diagnosis not present

## 2022-07-09 DIAGNOSIS — E538 Deficiency of other specified B group vitamins: Secondary | ICD-10-CM | POA: Diagnosis not present

## 2022-07-17 ENCOUNTER — Other Ambulatory Visit (HOSPITAL_COMMUNITY): Payer: Self-pay

## 2022-07-19 ENCOUNTER — Other Ambulatory Visit (HOSPITAL_COMMUNITY): Payer: Self-pay

## 2022-07-20 ENCOUNTER — Other Ambulatory Visit: Payer: Self-pay | Admitting: Cardiology

## 2022-07-20 ENCOUNTER — Other Ambulatory Visit (HOSPITAL_COMMUNITY): Payer: Self-pay

## 2022-07-24 ENCOUNTER — Other Ambulatory Visit (HOSPITAL_COMMUNITY): Payer: Self-pay

## 2022-07-27 ENCOUNTER — Other Ambulatory Visit: Payer: Self-pay | Admitting: Internal Medicine

## 2022-07-27 DIAGNOSIS — I5042 Chronic combined systolic (congestive) and diastolic (congestive) heart failure: Secondary | ICD-10-CM

## 2022-07-27 MED ORDER — FLUTICASONE-SALMETEROL 100-50 MCG/ACT IN AEPB: INHALATION_SPRAY | RESPIRATORY_TRACT | 1 refills | Status: DC

## 2022-07-27 NOTE — Telephone Encounter (Signed)
Duplicate request- already filled today Requested Prescriptions  Pending Prescriptions Disp Refills   fluticasone-salmeterol (ADVAIR) 100-50 MCG/ACT AEPB [Pharmacy Med Name: FLUTICASONE-SALMETEROL 100-50] 180 each 0    Sig: INHALE 1 PUFF BY MOUTH EVERY MORNING AND INHALE ONE PUFF BY MOUTH EVERY EVENING     Pulmonology:  Combination Products Passed - 07/27/2022  7:56 AM      Passed - Valid encounter within last 12 months    Recent Outpatient Visits           1 month ago Aortic atherosclerosis [place])   [place] [facility] [place], Salvadore Oxford, NP   5 months ago Impacted cerumen of right ear   [place] [facility] Smitty Cords, DO   7 months ago Viral URI with cough   [place] [facility] [place], Salvadore Oxford, NP   11 months ago Encounter for general adult medical examination with abnormal findings   [place] [facility] [place], Salvadore Oxford, NP   1 year ago Type 2 diabetes mellitus with complication [facility])   [place] [facility] [place], Salvadore Oxford, NP       Future Appointments             In 2 months Herbie Baltimore, Piedad Climes, MD [facility] at [facility]   In 4 months [place], Salvadore Oxford, NP [place] [facility], [facility]

## 2022-09-17 DIAGNOSIS — Z961 Presence of intraocular lens: Secondary | ICD-10-CM | POA: Diagnosis not present

## 2022-09-17 DIAGNOSIS — D239 Other benign neoplasm of skin, unspecified: Secondary | ICD-10-CM | POA: Diagnosis not present

## 2022-09-17 DIAGNOSIS — E119 Type 2 diabetes mellitus without complications: Secondary | ICD-10-CM | POA: Diagnosis not present

## 2022-09-17 DIAGNOSIS — H2511 Age-related nuclear cataract, right eye: Secondary | ICD-10-CM | POA: Diagnosis not present

## 2022-09-17 LAB — HM DIABETES EYE EXAM

## 2022-09-29 DIAGNOSIS — R200 Anesthesia of skin: Secondary | ICD-10-CM | POA: Diagnosis not present

## 2022-10-05 DIAGNOSIS — M62838 Other muscle spasm: Secondary | ICD-10-CM | POA: Diagnosis not present

## 2022-10-05 DIAGNOSIS — M79645 Pain in left finger(s): Secondary | ICD-10-CM | POA: Diagnosis not present

## 2022-10-15 ENCOUNTER — Ambulatory Visit (INDEPENDENT_AMBULATORY_CARE_PROVIDER_SITE_OTHER): Payer: PPO

## 2022-10-15 DIAGNOSIS — Z0000 Encounter for general adult medical examination without abnormal findings: Secondary | ICD-10-CM

## 2022-10-15 NOTE — Patient Instructions (Signed)
Troy Parrish , Thank you for taking time to come for your Medicare Wellness Visit. I appreciate your ongoing commitment to your health goals. Please review the following plan we discussed and let me know if I can assist you in the future.   Referrals/Orders/Follow-Ups/Clinician Recommendations: none  This is a list of the screening recommended for you and due dates:  Health Maintenance  Topic Date Due   Zoster (Shingles) Vaccine (1 of 2) Never done   Flu Shot  09/09/2022   Eye exam for diabetics  09/26/2022   Hemoglobin A1C  12/16/2022   Yearly kidney function blood test for diabetes  06/15/2023   Yearly kidney health urinalysis for diabetes  06/15/2023   Complete foot exam   06/15/2023   Medicare Annual Wellness Visit  10/15/2023   DTaP/Tdap/Td vaccine (2 - Td or Tdap) 03/03/2025   Colon Cancer Screening  06/30/2025   HIV Screening  Completed   Hepatitis C Screening  Addressed   HPV Vaccine  Aged Out   COVID-19 Vaccine  Discontinued    Advanced directives: (ACP Link)Information on Advanced Care Planning can be found at [facility] Advance Health Care Directives Advance Health Care Directives ([email or web address]   Next Medicare Annual Wellness Visit scheduled for next year: Yes   10/21/23 @ 10:45 am by phone

## 2022-10-15 NOTE — Progress Notes (Signed)
Subjective:   Troy Parrish. is a 62 y.o. male who presents for Medicare Annual/Subsequent preventive examination.  Visit Complete: Virtual  I connected with  Blythe Stanford. on 10/15/22 by a audio enabled telemedicine application and verified that I am speaking with the correct person using two identifiers.  Patient Location: Home  Provider Location: Office/Clinic  I discussed the limitations of evaluation and management by telemedicine. The patient expressed understanding and agreed to proceed.  Vital Signs: Unable to obtain new vitals due to this being a telehealth visit.  Review of Systems     Cardiac Risk Factors include: advanced age (>76men, >68 women);male gender;dyslipidemia;hypertension;sedentary lifestyle     Objective:    There were no vitals filed for this visit. There is no height or weight on file to calculate BMI.     10/15/2022   11:30 AM 01/26/2022   10:19 PM 11/19/2021    8:43 AM 10/09/2021   10:22 AM 09/11/2020    7:56 AM 01/23/2018    9:14 PM 08/29/2015    3:39 AM  Advanced Directives  Does Patient Have a Medical Advance Directive? No No Yes No Yes No Yes  Type of Surveyor, minerals;Living will  Healthcare Power of [place];Living will  Living will  Does patient want to make changes to medical advance directive?   No - Patient declined  No - Guardian declined    Copy of Healthcare Power of Attorney in Chart?   No - copy requested  No - copy requested  No - copy requested  Would patient like information on creating a medical advance directive? No - Patient declined   No - Patient declined       Current Medications (verified) Outpatient Encounter Medications as of 10/15/2022  Medication Sig   albuterol (VENTOLIN HFA) 108 (90 Base) MCG/ACT inhaler INHALE TWO PUFFS BY MOUTH EVERY 6 HOURS AS NEEDED FOR SHORTNESS OF BREATH   clobetasol cream (TEMOVATE) 0.05 % Apply 1 Application topically daily as needed (eczema).   EFFIENT 10  MG TABS tablet Take 1 tablet (10 mg total) by mouth daily.   fenofibrate (TRICOR) 48 MG tablet TAKE ONE TABLET BY MOUTH DAILY   fluticasone-salmeterol (ADVAIR) 100-50 MCG/ACT AEPB INHALE 1 PUFF BY MOUTH TWICE A DAY IN THE MORNING AND IN THE EVENING   ketoconazole (NIZORAL) 2 % shampoo Apply 1 Application topically 2 (two) times a week.   Lancets (ONETOUCH DELICA PLUS LANCET33G) MISC USE TO TEST BLOOD SUGAR UP TO 4 TIMES DAILY   metoprolol succinate (TOPROL-XL) 25 MG 24 hr tablet TAKE 1 AND 1/2 TABLET BY MOUTH EVERY EVENING   nitroGLYCERIN (NITROSTAT) 0.4 MG SL tablet Place 1 tablet (0.4 mg total) under the tongue every 5 (five) minutes as needed for chest pain.   Omega-3 Fatty Acids (FISH OIL) 1000 MG CPDR Take 3 g by mouth daily.   omeprazole (PRILOSEC) 40 MG capsule TAKE 1 CAPSULE BY MOUTH DAILY   ONETOUCH ULTRA test strip USE 1 STRIP TO TEST 4 TIMES A DAY   rosuvastatin (CRESTOR) 40 MG tablet TAKE ONE TABLET BY MOUTH DAILY   sildenafil (VIAGRA) 50 MG tablet Take 25 to 50 mg tablet  as needed for erectile dysfunction   fluticasone (FLONASE) 50 MCG/ACT nasal spray Place 2 sprays into both nostrils daily.   No facility-administered encounter medications on file as of 10/15/2022.    Allergies (verified) Niacin and related   History: Past Medical History:  Diagnosis  Date   Bronchitis    Gets bronchitis almost every year   CAD S/P percutaneous coronary angioplasty 06/2011; 6/ & 11/2013   S/P PCI to all 3 major vessels;a)  Ant STEMI 2001- BMS-LAD x2 -->(redo PCI 6/'15 -- pLAD Xience DES 2.5 x 12- 2.75 mm, mLAD 2.25 x 12 - 2.7 mm), b) '06 UA --> Cx- OM DES; c) 2013 Inf MI BMS mRCA --> d) 10/'15 PCI dRCA Promus P DES 4.0 x 16 (4.25 mm); PTCA of RPL2 (2.0 mm) &RPDA (2.25 mm) 11/2013; 4/18 PCI OM1 Synergy DES  2.5 x 16   COPD (chronic obstructive pulmonary disease) (HCC)    Elevated WBC count    Emphysema of lung (HCC)    Essential hypertension 07/07/2011   Former heavy tobacco smoker     quit in  May 2015 after multiple attempts at trying to quit before    GERD (gastroesophageal reflux disease)    Glucose intolerance (pre-diabetes) June 2015    hemoglobin A1c 6.6   Headache    "Imdur related; stopped taking it; headaches went away" (11/15/2013)   Heart murmur    History of colon polyps    History of stomach ulcers    Hyperlipidemia with target LDL less than 70 07/07/2011   Metabolic syndrome    Pre-diabetes, hypertension and truncal obesity as well as dyslipidemia   OSA on CPAP    Pneumonia "several times"   Recurrent upper respiratory infection (URI)    Seizures (HCC) "several"   "last one was ~ 2011" (11/15/2013)   Shortness of breath dyspnea    ST elevation myocardial infarction (STEMI) of anterior wall (HCC) 2001   History of -- 2 stents in early and distal mid LAD; prior cardiologist was Dr. Derwood Kaplan in [place]   ST elevation myocardial infarction (STEMI) of inferior wall (HCC) 07/07/2011   Occluded RCA 2.75X18 INTEGRITY; Echo 07/2013: EF 45-50%, Inferior & Posterolateral HK.    Umbilical hernia    Unstable angina (HCC) 2006; 07/2013   Cx-OM - PCI 2.5 mm 13 mm Cypher DES Jinny Blossom, [place]) ; 07/2013: Severe ISR of both prox & Distal LAD stentS --> 2 DES stents (1 at prox edge of the proximal stent, 2nd covers the entire distal stent as well as proximal and distal edge stenose)    Upper respiratory infection    Past Surgical History:  Procedure Laterality Date   BIOPSY N/A 06/30/2020   Procedure: BIOPSY;  Surgeon: Midge Minium, MD;  Location: [facility];  Service: Endoscopy;  Laterality: N/A;   Cardiac MRI [facility]  09/2014   EF 51%. Mod HK of basal-mid Inf wall, mild HK of basal inferoseptum & basal-mid inferolateral wall with hyper enhancement (c/w subendocard RCA MI with significant viability), focal hyper enhancement of apical wall c/w dLAD subendocard MI & complete LAD viability. . No aortic stenosis. Normal RV function. No Ischemia on Adenosine Stress.    CATARACT EXTRACTION W/PHACO Left 11/19/2021   Procedure: CATARACT EXTRACTION PHACO AND INTRAOCULAR LENS PLACEMENT (IOC) LEFT vivity lens;  Surgeon: Estanislado Pandy, MD;  Location: [facility];  Service: Ophthalmology;  Laterality: Left;  11.32 1:32.6   COLONOSCOPY WITH PROPOFOL N/A 03/28/2015   Procedure: COLONOSCOPY WITH PROPOFOL;  Surgeon: Midge Minium, MD;  Location: [facility];  Service: Endoscopy;  Laterality: N/A;   COLONOSCOPY WITH PROPOFOL N/A 06/30/2020   Procedure: COLONOSCOPY WITH PROPOFOL;  Surgeon: Midge Minium, MD;  Location: [facility];  Service: Endoscopy;  Laterality: N/A;   CORONARY ANGIOPLASTY  WITH STENT PLACEMENT  2001   ANTERIOR mi with a  PCI to LAD; [place], [place] - Dr. Elson Clan   CORONARY ANGIOPLASTY WITH STENT PLACEMENT  2006   [place] by Dr Elson Clan -lesion lft circ/OM1 with 2.5 x 13mm Cypher DES   CORONARY STENT INTERVENTION N/A 05/19/2016   Procedure: Coronary Stent Intervention;  Surgeon: Marykay Lex, MD;  Location: [facility] INVAS[facility] CV LAB;  Service: Cardiovascular: PCI pOM1 - Synergy DES 2.5 x 16 (overlaps old stent)   ESOPHAGOGASTRODUODENOSCOPY (EGD) WITH PROPOFOL N/A 03/28/2015   Procedure: ESOPHAGOGASTRODUODENOSCOPY (EGD) WITH PROPOFOL;  Surgeon: Midge Minium, MD;  Location: [facility];  Service: Endoscopy;  Laterality: N/A;   ESOPHAGOGASTRODUODENOSCOPY (EGD) WITH PROPOFOL N/A 06/30/2020   Procedure: ESOPHAGOGASTRODUODENOSCOPY (EGD) WITH PROPOFOL;  Surgeon: Midge Minium, MD;  Location: [facility];  Service: Endoscopy;  Laterality: N/A;   ESOPHAGOGASTRODUODENOSCOPY (EGD) WITH PROPOFOL N/A 09/11/2020   Procedure: ESOPHAGOGASTRODUODENOSCOPY (EGD) WITH PROPOFOL;  Surgeon: Midge Minium, MD;  Location: [facility];  Service: Endoscopy;  Laterality: N/A;   INSERTION OF MESH N/A 04/02/2015   Procedure: INSERTION OF MESH;  Surgeon: Lattie Haw, MD;  Location: [facility];  Service: General;  Laterality: N/A;   LEFT AND RIGHT  HEART CATHETERIZATION WITH CORONARY ANGIOGRAM N/A 07/30/2013   Procedure: LEFT AND RIGHT HEART CATHETERIZATION WITH CORONARY ANGIOGRAM;  Surgeon: Marykay Lex, MD;  Location: [facility] CATH LAB;  Service: Cardiovascular;  2 separate P & mLAD lesions -> PCI, MOd-Severe dRCA disesase with diffuse RPL/PDA disease (FFR)   LEFT HEART CATH  08/03/2013   Procedure: LEFT HEART CATH;  Surgeon: Marykay Lex, MD;  Location: [facility] CATH LAB;  Service: Cardiovascular;;FFR of RCA - non-flow-limiting   LEFT HEART CATH AND CORONARY ANGIOGRAPHY N/A 05/19/2016   Procedure: Left Heart Cath and Coronary Angiography;  Surgeon: Marykay Lex, MD;  Location: [facility] INVASIVE CV LAB;  Service: Cardiovascular: CULPRIT - 85% pOM1 pre-stent. Stents in both proximal and distal RCA widely patent. PTCA sites in RPL & PDA widely patent with stable 70% small RPL 2. Extensive stenting in the LAD widely patent stents with mild to mod Dz btw prox & mid-distal stents.    LEFT HEART CATH AND CORONARY ANGIOGRAPHY N/A 02/13/2018   Procedure: LEFT HEART CATH AND CORONARY ANGIOGRAPHY;  Surgeon: Marykay Lex, MD;  Location: [facility] INVASIVE CV LAB;  Service: Cardiovascular: Similar findings to last cath post PCI.  Stents are patent.  Roughly 50% proximal LAD lesion noted, may be progression from prior cath but otherwise stable disease.   LEFT HEART CATHETERIZATION WITH CORONARY ANGIOGRAM N/A 07/07/2011   Procedure: LEFT HEART CATHETERIZATION WITH CORONARY ANGIOGRAM;  Surgeon: Marykay Lex, MD;  Location: [facility] CATH LAB;  Service: Cardiovascular;  inferolat post LV infarct ST-elevation MI   MET/CPET  11/09/2011   submax. effort 1.05 RER peak V02 was 51% ,chronotropic incomp.hrt rate lows 80s to 100   MET/CPET  September 2016   [facility]: Mild functional impairment due primarily to mild pulmonary and circulatory limitations. Also suggest physical deconditioning (seen with low-normal aerobic reserve). Mild VQ mismatch with exercise.  Ventilatory reserve exhausted with  peak exercise demonstrated pulmonary limitation. No significant decrease in postexercise FEV1 compared to rest --> suggest no exercise induced bronchospasm   NM MYOVIEW ([facility] HX)  11/30/2011   EF 45% ,exercise 10 METS, infarct\scar w mild perinfarct ischemia -basal inferolat and mid inferolat region   NM MYOVIEW LTD  04/25/2016   Exercised for 9 minutes. Reached 90% max. Heart  rate. 9.4 METS. Read as a low risk study with normal EF 55-65%. -> on Review -  there does appear to be a small to moderate sized, medium severtiy reversible perfusion defect in the anterior wall - read by computer, but not noted by reader.   PERCUTANEOUS CORONARY STENT INTERVENTION (PCI-S)  07/07/2011   Procedure: PERCUTANEOUS CORONARY STENT INTERVENTION (PCI-S);  Surgeon: Marykay Lex, MD;  Location: [facility] CATH LAB;  Service: Cardiovascular;;occluded RCA ,Integrity Resolute DES 2.75 x 18 mm stent - post-dilated 3.1 mm   PERCUTANEOUS CORONARY STENT INTERVENTION (PCI-S)  07/30/2013   Procedure: PERCUTANEOUS CORONARY STENT INTERVENTION (PCI-S);  Surgeon: Marykay Lex, MD;  Location: [facility] CATH LAB;  Service: Cardiovascular;;Distal mid LAD: Xience Alpine DES 2.25 mm x 28 mm (overlapping proximal and distal edge of previous stent) - 2.7 mm; proximal LAD 2.5 mm x 12 mm Xience Alpine DES (2.8 mm);; RCA 60-70% stenosis planned staged procedure   PERCUTANEOUS CORONARY STENT INTERVENTION (PCI-S) N/A 11/15/2013   Procedure: PERCUTANEOUS CORONARY STENT INTERVENTION (PCI-S);  Surgeon: Marykay Lex, MD;  Location: [facility] C[facility];  Service: Cardiovascular;  Patent Cx & LAD Stents; PCI -dRCA Promus Premier DES 4.0 mm x 16 mm (4.25 mm) dRCA, 2.0 mm Cutting PTCA of RPL2 [place] & POBA of mRPDA   POLYPECTOMY  03/28/2015   Procedure: POLYPECTOMY;  Surgeon: Midge Minium, MD;  Location: [facility];  Service: Endoscopy;;   POLYPECTOMY N/A 06/30/2020   Procedure: POLYPECTOMY;  Surgeon: Midge Minium, MD;  Location: [facility];  Service:  Endoscopy;  Laterality: N/A;   RIGHT HEART CATH  June 2015   Normal pressures with severely reduced cardiac output and index. (3.25/1.55)   TRANSTHORACIC ECHOCARDIOGRAM  07/31/2013; February 2017   a. LVEF 45-50. Inferior posterior hypokinesis with mild dilation. Apparent normal diastolic pressures;    UMBILICAL HERNIA REPAIR N/A 04/02/2015   Procedure: HERNIA REPAIR UMBILICAL ADULT;  Surgeon: Lattie Haw, MD;  Location: [facility] ORS;  Service: General;  Laterality: N/A;   Family History  Problem Relation Age of Onset   Coronary artery disease Father    Heart disease Father    Stroke Father    Hypertension Father    Hyperlipidemia Mother    Diabetes Mother    Hyperlipidemia Maternal Grandmother    Diabetes Maternal Grandmother    Hyperlipidemia Maternal Grandfather    Diabetes Maternal Grandfather    Emphysema Maternal Grandfather    Heart disease Paternal Grandmother    Heart disease Paternal Grandfather    Cancer Maternal Aunt        breast   Social History   Socioeconomic History   Marital status: Married    Spouse name: Not on file   Number of children: 8   Years of education: Not on file   Highest education level: Not on file  Occupational History   Not on file  Tobacco Use   Smoking status: Every Day    Current packs/day: 0.10    Average packs/day: 0.1 packs/day for 40.0 years (4.0 ttl pk-yrs)    Types: Cigarettes   Smokeless tobacco: Never   Tobacco comments:    Quit with Bupropion 150mg  - > unfortunately, he restarted; has cut all the way down to maybe 1 or 2 cigarettes a day, some days he goes without any.-Some days even multiple days without cigarettes.  Vaping Use   Vaping status: Never Used  Substance and Sexual Activity   Alcohol use: Yes    Comment: rare  Drug use: No   Sexual activity: Not on file  Other Topics Concern   Not on file  Social History Narrative   He is a married father of 89, grandfather of 8. He does not really get routine exercise.     He is down to 1-2 cigarettes a day-sometimes going more than 2 to 3 days without 1.. He is very seriously wanting to quit.     He has a social alcoholic beverage every now and then.       He is no longer working, medically retired essentially.  His wife still works.  He and his wife enjoy traveling have done multiple trips and vacations over the last couple years including several trips to [place] to be near her family.      Late summer 2023-2-week trip to [place] [facility], and then drove up to the [employer] including seeing [place].  Lots of walking and hiking.   Social Determinants of Health   Financial Resource Strain: Low Risk  (10/15/2022)   Overall Financial Resource Strain (CARDIA)    Difficulty of Paying Living Expenses: Not hard at all  Food Insecurity: No Food Insecurity (10/15/2022)   Hunger Vital Sign    Worried About Running Out of Food in the Last Year: Never true    Ran Out of Food in the Last Year: Never true  Transportation Needs: No Transportation Needs (10/15/2022)   PRAPARE - Administrator, Civil Service (Medical): No    Lack of Transportation (Non-Medical): No  Physical Activity: Inactive (10/15/2022)   Exercise Vital Sign    Days of Exercise per Week: 0 days    Minutes of Exercise per Session: 0 min  Stress: No Stress Concern Present (10/15/2022)   [employer] of Occupational Health - Occupational Stress Questionnaire    Feeling of Stress : Not at all  Social Connections: Moderately Integrated (10/15/2022)   Social Connection and Isolation Panel [NHANES]    Frequency of Communication with Friends and Family: More than three times a week    Frequency of Social Gatherings with Friends and Family: More than three times a week    Attends Religious Services: 1 to 4 times per year    Active Member of [employer] or Organizations: No    Attends Engineer, structural: Never    Marital Status: Married    Tobacco  Counseling Ready to quit: Not Answered Counseling given: Not Answered Tobacco comments: Quit with Bupropion 150mg  - > unfortunately, he restarted; has cut all the way down to maybe 1 or 2 cigarettes a day, some days he goes without any.-Some days even multiple days without cigarettes.   Clinical Intake:  Pre-visit preparation completed: Yes  Pain : No/denies pain     Nutritional Status: BMI of 19-24  Normal Nutritional Risks: None Diabetes: No  How often do you need to have someone help you when you read instructions, pamphlets, or other written materials from your doctor or pharmacy?: 1 - Never  Interpreter Needed?: No  Information entered by :: Kennedy Bucker, LPN   Activities of Daily Living    10/15/2022   11:31 AM 06/15/2022    8:52 AM  In your present state of health, do you have any difficulty performing the following activities:  Hearing? 0 0  Vision? 0 0  Difficulty concentrating or making decisions? 0 0  Walking or climbing stairs? 0 0  Dressing or bathing? 0 0  Doing errands,  shopping? 0 0  Preparing Food and eating ? N   Using the Toilet? N   In the past six months, have you accidently leaked urine? N   Do you have problems with loss of bowel control? N   Managing your Medications? N   Managing your Finances? N   Housekeeping or managing your Housekeeping? N     Patient Care Team: Lorre Munroe, NP as PCP - General (Internal Medicine) Marykay Lex, MD as PCP - Cardiology (Cardiology)  Indicate any recent Medical Services you may have received from other than [facility] providers in the past year (date may be approximate).     Assessment:   This is a routine wellness examination for [place].  Hearing/Vision screen Hearing Screening - Comments:: No aids Vision Screening - Comments:: No glasses- [facility]   Goals Addressed             This Visit's Progress    DIET - INCREASE WATER INTAKE         Depression Screen    10/15/2022   11:28 AM  06/15/2022    8:51 AM 02/26/2022   11:06 AM 12/24/2021    3:36 PM 10/09/2021   10:20 AM 08/13/2021    3:14 PM 05/05/2020   12:34 PM  PHQ 2/9 Scores  PHQ - 2 Score 0 0 0 0 0 0 0  PHQ- 9 Score 0  0  0 0     Fall Risk    10/15/2022   11:31 AM 06/15/2022    8:51 AM 02/26/2022   11:06 AM 12/24/2021    3:36 PM 10/09/2021   10:23 AM  Fall Risk   Falls in the past year? 0 0 0 0 0  Number falls in past yr: 0  0  0  Injury with Fall? 0 0 0 0 0  Risk for fall due to : No Fall Risks No Fall Risks No Fall Risks  No Fall Risks  Follow up Falls prevention discussed;Falls evaluation completed  Falls evaluation completed  Falls evaluation completed    MEDICARE RISK AT HOME: Medicare Risk at Home Any stairs in or around the home?: Yes If so, are there any without handrails?: No Home free of loose throw rugs in walkways, pet beds, electrical cords, etc?: Yes Adequate lighting in your home to reduce risk of falls?: Yes Life alert?: No Use of a cane, walker or w/c?: No Grab bars in the bathroom?: Yes Shower chair or bench in shower?: No Elevated toilet seat or a handicapped toilet?: No  TIMED UP AND GO:  Was the test performed?  No    Cognitive Function:        10/15/2022   11:32 AM 10/09/2021   10:24 AM  6CIT Screen  What Year? 0 points 0 points  What month? 0 points 0 points  What time? 0 points 0 points  Count back from 20 0 points 0 points  Months in reverse 0 points 0 points  Repeat phrase 0 points 0 points  Total Score 0 points 0 points    Immunizations Immunization History  Administered Date(s) Administered   Influenza Split 11/09/2012   Influenza,inj,Quad PF,6+ Mos 11/16/2013, 11/29/2014, 11/11/2015, 11/10/2016, 12/01/2017, 12/07/2018, 12/26/2019, 12/03/2020, 11/23/2021   Moderna SARS-COV2 Booster Vaccination 05/08/2020   Moderna Sars-Covid-2 Vaccination 04/25/2019, 05/14/2019, 01/10/2020, 10/30/2020   Pneumococcal Conjugate-13 11/29/2014, 05/05/2020   Pneumococcal  Polysaccharide-23 07/11/2011, 11/10/2016, 01/02/2019   Tdap 03/04/2015    TDAP status: Up to date  Flu  Vaccine status: Up to date  Pneumococcal vaccine status: Up to date  Covid-19 vaccine status: Completed vaccines  Qualifies for Shingles Vaccine? Yes   Zostavax completed No   Shingrix Completed?: No.    Education has been provided regarding the importance of this vaccine. Patient has been advised to call insurance company to determine out of pocket expense if they have not yet received this vaccine. Advised may also receive vaccine at local pharmacy or Health Dept. Verbalized acceptance and understanding.  Screening Tests Health Maintenance  Topic Date Due   Zoster Vaccines- Shingrix (1 of 2) Never done   INFLUENZA VACCINE  09/09/2022   OPHTHALMOLOGY EXAM  09/26/2022   HEMOGLOBIN A1C  12/16/2022   Diabetic kidney evaluation - eGFR measurement  06/15/2023   Diabetic kidney evaluation - Urine ACR  06/15/2023   FOOT EXAM  06/15/2023   Medicare Annual Wellness (AWV)  10/15/2023   DTaP/Tdap/Td (2 - Td or Tdap) 03/03/2025   Colonoscopy  06/30/2025   HIV Screening  Completed   Hepatitis C Screening  Addressed   HPV VACCINES  Aged Out   COVID-19 Vaccine  Discontinued    Health Maintenance  Health Maintenance Due  Topic Date Due   Zoster Vaccines- Shingrix (1 of 2) Never done   INFLUENZA VACCINE  09/09/2022   OPHTHALMOLOGY EXAM  09/26/2022    Colorectal cancer screening: Type of screening: Colonoscopy. Completed 06/30/20. Repeat every 5 years  Lung Cancer Screening: (Low Dose CT Chest recommended if Age 80-80 years, 20 pack-year currently smoking OR have quit w/in 15years.) does not qualify.    Additional Screening:  Hepatitis C Screening: does qualify; Completed 03/31/16  Vision Screening: Recommended annual ophthalmology exams for early detection of glaucoma and other disorders of the eye. Is the patient up to date with their annual eye exam?  Yes  Who is the provider  or what is the name of the office in which the patient attends annual eye exams? [facility] If pt is not established with a provider, would they like to be referred to a provider to establish care? No .   Dental Screening: Recommended annual dental exams for proper oral hygiene  Diabetic Foot Exam: Diabetic Foot Exam: Completed 06/15/22  Community Resource Referral / Chronic Care Management: CRR required this visit?  No   CCM required this visit?  No     Plan:     I have personally reviewed and noted the following in the patient's chart:   Medical and social history Use of alcohol, tobacco or illicit drugs  Current medications and supplements including opioid prescriptions. Patient is not currently taking opioid prescriptions. Functional ability and status Nutritional status Physical activity Advanced directives List of other physicians Hospitalizations, surgeries, and ER visits in previous 12 months Vitals Screenings to include cognitive, depression, and falls Referrals and appointments  In addition, I have reviewed and discussed with patient certain preventive protocols, quality metrics, and best practice recommendations. A written personalized care plan for preventive services as well as general preventive health recommendations were provided to patient.     Hal Hope, LPN   09/09/9560   After Visit Summary: (MyChart) Due to this being a telephonic visit, the after visit summary with patients personalized plan was offered to patient via MyChart   Nurse Notes: none

## 2022-10-16 ENCOUNTER — Other Ambulatory Visit: Payer: Self-pay | Admitting: Cardiology

## 2022-10-19 ENCOUNTER — Other Ambulatory Visit: Payer: Self-pay | Admitting: Cardiology

## 2022-10-21 NOTE — Telephone Encounter (Signed)
Refill completed.

## 2022-10-22 ENCOUNTER — Ambulatory Visit: Payer: PPO | Admitting: Cardiology

## 2022-11-13 ENCOUNTER — Other Ambulatory Visit (HOSPITAL_COMMUNITY): Payer: Self-pay

## 2022-11-13 ENCOUNTER — Other Ambulatory Visit: Payer: Self-pay | Admitting: Cardiology

## 2022-11-13 MED ORDER — PRASUGREL HCL 10 MG PO TABS
10.0000 mg | ORAL_TABLET | Freq: Every day | ORAL | 3 refills | Status: DC
  Filled 2022-11-13: qty 90, 90d supply, fill #0

## 2022-11-17 ENCOUNTER — Other Ambulatory Visit: Payer: Self-pay

## 2022-11-17 ENCOUNTER — Other Ambulatory Visit: Payer: Self-pay | Admitting: Cardiology

## 2022-11-17 ENCOUNTER — Other Ambulatory Visit (HOSPITAL_COMMUNITY): Payer: Self-pay

## 2022-11-17 ENCOUNTER — Telehealth: Payer: Self-pay | Admitting: Cardiology

## 2022-11-17 MED ORDER — PRASUGREL HCL 10 MG PO TABS
10.0000 mg | ORAL_TABLET | Freq: Every day | ORAL | 3 refills | Status: DC
  Filled 2022-11-17: qty 90, 90d supply, fill #0
  Filled 2023-02-16: qty 90, 90d supply, fill #1
  Filled 2023-05-13: qty 90, 90d supply, fill #2

## 2022-11-17 NOTE — Telephone Encounter (Signed)
Pt c/o medication issue:  1. Name of Medication: prasugrel (EFFIENT) 10 MG TABS tablet   2. How are you currently taking this medication (dosage and times per day)? Take 1 tablet (10 mg total) by mouth daily.   3. Are you having a reaction (difficulty breathing--STAT)? No   4. What is your medication issue? Patient said that the insurance with not pay for the generic brand. Patient only have 2 pills left of medication and wants to know what Dr. Herbie Baltimore wants to do

## 2022-11-17 NOTE — Telephone Encounter (Signed)
Spoke with pharmacy and he wants "Brand" but insurance will not cover without prescription stating Brand name only New RX sent as Brand name only.  Advised patient of information . He will cal lif any issues

## 2022-12-07 ENCOUNTER — Ambulatory Visit (INDEPENDENT_AMBULATORY_CARE_PROVIDER_SITE_OTHER): Payer: PPO

## 2022-12-07 DIAGNOSIS — Z23 Encounter for immunization: Secondary | ICD-10-CM | POA: Diagnosis not present

## 2022-12-16 ENCOUNTER — Encounter: Payer: Self-pay | Admitting: Internal Medicine

## 2022-12-16 ENCOUNTER — Ambulatory Visit (INDEPENDENT_AMBULATORY_CARE_PROVIDER_SITE_OTHER): Payer: PPO | Admitting: Internal Medicine

## 2022-12-16 VITALS — BP 116/68 | Ht 71.0 in | Wt 163.0 lb

## 2022-12-16 DIAGNOSIS — Z122 Encounter for screening for malignant neoplasm of respiratory organs: Secondary | ICD-10-CM | POA: Diagnosis not present

## 2022-12-16 DIAGNOSIS — E118 Type 2 diabetes mellitus with unspecified complications: Secondary | ICD-10-CM

## 2022-12-16 DIAGNOSIS — Z125 Encounter for screening for malignant neoplasm of prostate: Secondary | ICD-10-CM | POA: Diagnosis not present

## 2022-12-16 DIAGNOSIS — Z0001 Encounter for general adult medical examination with abnormal findings: Secondary | ICD-10-CM

## 2022-12-16 DIAGNOSIS — H6123 Impacted cerumen, bilateral: Secondary | ICD-10-CM

## 2022-12-16 NOTE — Progress Notes (Signed)
Subjective:    Patient ID: Troy Parrish., male    DOB: 15-Mar-1960, 62 y.o.   MRN: [id number]  HPI  Patient presents to clinic today for his annual exam. Flu: 11/2022 Tetanus: 02/2015 Pneumovax: 04/2020 Shingrix: 11/2022 Covid: x 4 PSA screening: 08/2021 Colon screening: 06/2020 Vision screening: annually Dentist: as needed  Diet: He does eat meat. He consumes more veggies than fruits. He does eat some fried foods. He drinks mostly water. Exercise: None  Review of Systems     Past Medical History:  Diagnosis Date   Bronchitis    Gets bronchitis almost every year   CAD S/P percutaneous coronary angioplasty 06/2011; 6/ & 11/2013   S/P PCI to all 3 major vessels;a)  Ant STEMI 2001- BMS-LAD x2 -->(redo PCI 6/'15 -- pLAD Xience DES 2.5 x 12- 2.75 mm, mLAD 2.25 x 12 - 2.7 mm), b) '06 UA --> Cx- OM DES; c) 2013 Inf MI BMS mRCA --> d) 10/'15 PCI dRCA Promus P DES 4.0 x 16 (4.25 mm); PTCA of RPL2 (2.0 mm) &RPDA (2.25 mm) 11/2013; 4/18 PCI OM1 Synergy DES  2.5 x 16   COPD (chronic obstructive pulmonary disease) (HCC)    Elevated WBC count    Emphysema of lung (HCC)    Essential hypertension 07/07/2011   Former heavy tobacco smoker     quit in May 2015 after multiple attempts at trying to quit before    GERD (gastroesophageal reflux disease)    Glucose intolerance (pre-diabetes) June 2015    hemoglobin A1c 6.6   Headache    "Imdur related; stopped taking it; headaches went away" (11/15/2013)   Heart murmur    History of colon polyps    History of stomach ulcers    Hyperlipidemia with target LDL less than 70 07/07/2011   Metabolic syndrome    Pre-diabetes, hypertension and truncal obesity as well as dyslipidemia   OSA on CPAP    Pneumonia "several times"   Recurrent upper respiratory infection (URI)    Seizures (HCC) "several"   "last one was ~ 2011" (11/15/2013)   Shortness of breath dyspnea    ST elevation myocardial infarction (STEMI) of anterior wall (HCC) 2001   History of --  2 stents in early and distal mid LAD; prior cardiologist was Dr. Derwood Kaplan in [place]   ST elevation myocardial infarction (STEMI) of inferior wall (HCC) 07/07/2011   Occluded RCA 2.75X18 INTEGRITY; Echo 07/2013: EF 45-50%, Inferior & Posterolateral HK.    Umbilical hernia    Unstable angina (HCC) 2006; 07/2013   Cx-OM - PCI 2.5 mm 13 mm Cypher DES Jinny Blossom, [place]) ; 07/2013: Severe ISR of both prox & Distal LAD stentS --> 2 DES stents (1 at prox edge of the proximal stent, 2nd covers the entire distal stent as well as proximal and distal edge stenose)    Upper respiratory infection     Current Outpatient Medications  Medication Sig Dispense Refill   albuterol (VENTOLIN HFA) 108 (90 Base) MCG/ACT inhaler INHALE TWO PUFFS BY MOUTH EVERY 6 HOURS AS NEEDED FOR SHORTNESS OF BREATH 25.5 g 1   clobetasol cream (TEMOVATE) 0.05 % Apply 1 Application topically daily as needed (eczema). 30 g 0   fenofibrate (TRICOR) 48 MG tablet TAKE 1 TABLET BY MOUTH DAILY 90 tablet 1   fluticasone-salmeterol (ADVAIR) 100-50 MCG/ACT AEPB INHALE 1 PUFF BY MOUTH TWICE A DAY IN THE MORNING AND IN THE EVENING 180 each 1   ketoconazole (NIZORAL) 2 % shampoo Apply  1 Application topically 2 (two) times a week. 120 mL 0   Lancets (ONETOUCH DELICA PLUS LANCET33G) MISC USE TO TEST BLOOD SUGAR UP TO 4 TIMES DAILY 100 each 3   metoprolol succinate (TOPROL-XL) 25 MG 24 hr tablet TAKE 1 AND 1/2 TABLET BY MOUTH EVERY EVENING 135 tablet 2   nitroGLYCERIN (NITROSTAT) 0.4 MG SL tablet Place 1 tablet (0.4 mg total) under the tongue every 5 (five) minutes as needed for chest pain. 25 tablet 11   Omega-3 Fatty Acids (FISH OIL) 1000 MG CPDR Take 3 g by mouth daily. 30 capsule 11   omeprazole (PRILOSEC) 40 MG capsule TAKE 1 CAPSULE BY MOUTH DAILY 90 capsule 3   ONETOUCH ULTRA test strip USE 1 STRIP TO TEST 4 TIMES A DAY 200 strip 1   prasugrel (EFFIENT) 10 MG TABS tablet Take 1 tablet (10 mg total) by mouth daily. 90 tablet 3    rosuvastatin (CRESTOR) 40 MG tablet TAKE ONE TABLET BY MOUTH DAILY 90 tablet 3   sildenafil (VIAGRA) 50 MG tablet TAKE 1/2 TO 1 TABLET BY MOUTH AS NEEDED FOR ERECTILE DYSFUNCTION 10 tablet 6   fluticasone (FLONASE) 50 MCG/ACT nasal spray Place 2 sprays into both nostrils daily. 16 g 0   No current facility-administered medications for this visit.    Allergies  Allergen Reactions   Niacin And Related Hives and Itching    Family History  Problem Relation Age of Onset   Coronary artery disease Father    Heart disease Father    Stroke Father    Hypertension Father    Hyperlipidemia Mother    Diabetes Mother    Hyperlipidemia Maternal Grandmother    Diabetes Maternal Grandmother    Hyperlipidemia Maternal Grandfather    Diabetes Maternal Grandfather    Emphysema Maternal Grandfather    Heart disease Paternal Grandmother    Heart disease Paternal Grandfather    Cancer Maternal Aunt        breast    Social History   Socioeconomic History   Marital status: Married    Spouse name: Not on file   Number of children: 8   Years of education: Not on file   Highest education level: Not on file  Occupational History   Not on file  Tobacco Use   Smoking status: Every Day    Current packs/day: 0.10    Average packs/day: 0.1 packs/day for 40.0 years (4.0 ttl pk-yrs)    Types: Cigarettes   Smokeless tobacco: Never   Tobacco comments:    Quit with Bupropion 150mg  - > unfortunately, he restarted; has cut all the way down to maybe 1 or 2 cigarettes a day, some days he goes without any.-Some days even multiple days without cigarettes.  Vaping Use   Vaping status: Never Used  Substance and Sexual Activity   Alcohol use: Yes    Comment: rare   Drug use: No   Sexual activity: Not on file  Other Topics Concern   Not on file  Social History Narrative   He is a married father of 8, grandfather of 8. He does not really get routine exercise.    He is down to 1-2 cigarettes a day-sometimes  going more than 2 to 3 days without 1.. He is very seriously wanting to quit.     He has a social alcoholic beverage every now and then.       He is no longer working, medically retired essentially.  His wife still works.  He and his wife enjoy traveling have done multiple trips and vacations over the last couple years including several trips to [place] to be near her family.      Late summer 2023-2-week trip to [place] [facility], and then drove up to the [employer] including seeing [place] and [place].  Lots of walking and hiking.   Social Determinants of Health   Financial Resource Strain: Low Risk  (10/15/2022)   Overall Financial Resource Strain (CARDIA)    Difficulty of Paying Living Expenses: Not hard at all  Food Insecurity: No Food Insecurity (10/15/2022)   Hunger Vital Sign    Worried About Running Out of Food in the Last Year: Never true    Ran Out of Food in the Last Year: Never true  Transportation Needs: No Transportation Needs (10/15/2022)   PRAPARE - Administrator, Civil Service (Medical): No    Lack of Transportation (Non-Medical): No  Physical Activity: Inactive (10/15/2022)   Exercise Vital Sign    Days of Exercise per Week: 0 days    Minutes of Exercise per Session: 0 min  Stress: No Stress Concern Present (10/15/2022)   [employer] of Occupational Health - Occupational Stress Questionnaire    Feeling of Stress : Not at all  Social Connections: Moderately Integrated (10/15/2022)   Social Connection and Isolation Panel [NHANES]    Frequency of Communication with Friends and Family: More than three times a week    Frequency of Social Gatherings with Friends and Family: More than three times a week    Attends Religious Services: 1 to 4 times per year    Active Member of [employer] or Organizations: No    Attends Banker Meetings: Never    Marital Status: Married  Catering manager Violence: Not At Risk (10/15/2022)   Humiliation,  Afraid, Rape, and Kick questionnaire    Fear of Current or Ex-Partner: No    Emotionally Abused: No    Physically Abused: No    Sexually Abused: No     Constitutional: Denies fever, malaise, fatigue, headache or abrupt weight changes.  HEENT: Denies eye pain, eye redness, ear pain, ringing in the ears, wax buildup, runny nose, nasal congestion, bloody nose, or sore throat. Respiratory: Denies difficulty breathing, shortness of breath, cough or sputum production.   Cardiovascular: Denies chest pain, chest tightness, palpitations or swelling in the hands or feet.  Gastrointestinal: Denies abdominal pain, bloating, constipation, diarrhea or blood in the stool.  GU: Patient reports erectile dysfunction.  Denies urgency, frequency, pain with urination, burning sensation, blood in urine, odor or discharge. Musculoskeletal: Patient reports muscle spasms in left hand.  Denies decrease in range of motion, difficulty with gait, muscle pain or joint pain and swelling.  Skin: Denies redness, rashes, lesions or ulcercations.  Neurological: Denies dizziness, difficulty with memory, difficulty with speech or problems with balance and coordination.  Psych: Denies anxiety, depression, SI/HI.  No other specific complaints in a complete review of systems (except as listed in HPI above).  Objective:   Physical Exam    BP 116/68   Ht 5\' 11"  (1.803 m)   Wt 163 lb (73.9 kg)   BMI 22.73 kg/m  Wt Readings from Last 3 Encounters:  12/16/22 163 lb (73.9 kg)  06/15/22 177 lb (80.3 kg)  04/30/22 177 lb 3.2 oz (80.4 kg)    General: Appears his stated age, well developed, well nourished in NAD. Skin: Warm, dry and intact. No  ulcerations  noted. HEENT: Head: normal shape and size; Eyes: sclera white, no icterus, conjunctiva pink, PERRLA and EOMs intact; Ears: Bilateral cerumen impaction Neck:  Neck supple, trachea midline. No masses, lumps or thyromegaly present.  Cardiovascular: Normal rate and rhythm.  S1,S2 noted.  No murmur, rubs or gallops noted. No JVD or BLE edema. No carotid bruits noted. Pulmonary/Chest: Normal effort and positive vesicular breath sounds. No respiratory distress. No wheezes, rales or ronchi noted.  Abdomen: Soft and nontender. Normal bowel sounds.  Musculoskeletal: Strength 5/5 BUE/BLE.  No difficulty with gait.  Neurological: Alert and oriented. Cranial nerves II-XII grossly intact. Coordination normal.  Psychiatric: Mood and affect normal. Behavior is normal. Judgment and thought content normal.    BMET    Component Value Date/Time   NA 139 06/15/2022 0844   NA 139 11/15/2019 1010   NA 139 10/24/2013 1050   K 4.5 06/15/2022 0844   K 3.8 10/24/2013 1050   CL 108 06/15/2022 0844   CL 107 10/24/2013 1050   CO2 21 06/15/2022 0844   CO2 24 10/24/2013 1050   GLUCOSE 135 (H) 06/15/2022 0844   GLUCOSE 108 (H) 10/24/2013 1050   BUN 17 06/15/2022 0844   BUN 16 11/15/2019 1010   BUN 8 10/24/2013 1050   CREATININE 1.09 06/15/2022 0844   CALCIUM 9.5 06/15/2022 0844   CALCIUM 8.2 (L) 10/24/2013 1050   GFRNONAA 46 (L) 01/26/2022 2236   GFRNONAA >60 10/24/2013 1050   GFRAA 81 11/15/2019 1010   GFRAA >60 10/24/2013 1050    Lipid Panel     Component Value Date/Time   CHOL 122 06/15/2022 0844   CHOL 141 11/15/2019 1010   TRIG 150 (H) 06/15/2022 0844   HDL 39 (L) 06/15/2022 0844   HDL 32 (L) 11/15/2019 1010   CHOLHDL 3.1 06/15/2022 0844   VLDL 18.2 05/05/2020 1304   LDLCALC 60 06/15/2022 0844    CBC    Component Value Date/Time   WBC 14.4 (H) 06/15/2022 0844   RBC 5.58 06/15/2022 0844   HGB 16.6 06/15/2022 0844   HGB 17.7 11/15/2019 1010   HCT 50.8 (H) 06/15/2022 0844   HCT 51.9 (H) 11/15/2019 1010   PLT 368 06/15/2022 0844   PLT 391 11/15/2019 1010   MCV 91.0 06/15/2022 0844   MCV 88 11/15/2019 1010   MCV 89 10/24/2013 1050   MCH 29.7 06/15/2022 0844   MCHC 32.7 06/15/2022 0844   RDW 13.4 06/15/2022 0844   RDW 13.0 11/15/2019 1010   RDW 14.5  10/24/2013 1050   LYMPHSABS 4.6 (H) 04/04/2015 1040   LYMPHSABS 2.8 11/07/2013 1509   MONOABS 0.9 04/04/2015 1040   EOSABS 0.1 04/04/2015 1040   EOSABS 0.2 11/07/2013 1509   BASOSABS 0.1 04/04/2015 1040   BASOSABS 0.1 11/07/2013 1509    Hgb A1C Lab Results  Component Value Date   HGBA1C 6.1 (A) 06/15/2022          Assessment & Plan:   Preventative health maintenance:  Flu shot UTD Tetanus UTD Encouraged him to get his COVID booster Pneumovax UTD Advised him to get his second Shingrix CT lung cancer screening ordered Colon screening UTD Encouraged him to consume a balanced diet and exercise regimen Advised him to see an eye doctor and dentist annually Will check CBC, c-Met, lipid, A1c and PSA today  Bilateral cerumen impaction:  Manual lavage by CMA Advised him to try Debrox drops weekly to prevent wax buildup  RTC in 6 months, follow-up chronic conditions Nicki Reaper, NP

## 2022-12-16 NOTE — Patient Instructions (Signed)
Health Maintenance, Male Adopting a healthy lifestyle and getting preventive care are important in promoting health and wellness. Ask your health care provider about: The right schedule for you to have regular tests and exams. Things you can do on your own to prevent diseases and keep yourself healthy. What should I know about diet, weight, and exercise? Eat a healthy diet  Eat a diet that includes plenty of vegetables, fruits, low-fat dairy products, and lean protein. Do not eat a lot of foods that are high in solid fats, added sugars, or sodium. Maintain a healthy weight Body mass index (BMI) is a measurement that can be used to identify possible weight problems. It estimates body fat based on height and weight. Your health care provider can help determine your BMI and help you achieve or maintain a healthy weight. Get regular exercise Get regular exercise. This is one of the most important things you can do for your health. Most adults should: Exercise for at least 150 minutes each week. The exercise should increase your heart rate and make you sweat (moderate-intensity exercise). Do strengthening exercises at least twice a week. This is in addition to the moderate-intensity exercise. Spend less time sitting. Even light physical activity can be beneficial. Watch cholesterol and blood lipids Have your blood tested for lipids and cholesterol at 62 years of age, then have this test every 5 years. You may need to have your cholesterol levels checked more often if: Your lipid or cholesterol levels are high. You are older than 62 years of age. You are at high risk for heart disease. What should I know about cancer screening? Many types of cancers can be detected early and may often be prevented. Depending on your health history and family history, you may need to have cancer screening at various ages. This may include screening for: Colorectal cancer. Prostate cancer. Skin cancer. Lung  cancer. What should I know about heart disease, diabetes, and high blood pressure? Blood pressure and heart disease High blood pressure causes heart disease and increases the risk of stroke. This is more likely to develop in people who have high blood pressure readings or are overweight. Talk with your health care provider about your target blood pressure readings. Have your blood pressure checked: Every 3-5 years if you are 18-39 years of age. Every year if you are 40 years old or older. If you are between the ages of 65 and 75 and are a current or former smoker, ask your health care provider if you should have a one-time screening for abdominal aortic aneurysm (AAA). Diabetes Have regular diabetes screenings. This checks your fasting blood sugar level. Have the screening done: Once every three years after age 45 if you are at a normal weight and have a low risk for diabetes. More often and at a younger age if you are overweight or have a high risk for diabetes. What should I know about preventing infection? Hepatitis B If you have a higher risk for hepatitis B, you should be screened for this virus. Talk with your health care provider to find out if you are at risk for hepatitis B infection. Hepatitis C Blood testing is recommended for: Everyone born from 1945 through 1965. Anyone with known risk factors for hepatitis C. Sexually transmitted infections (STIs) You should be screened each year for STIs, including gonorrhea and chlamydia, if: You are sexually active and are younger than 62 years of age. You are older than 62 years of age and your   health care provider tells you that you are at risk for this type of infection. Your sexual activity has changed since you were last screened, and you are at increased risk for chlamydia or gonorrhea. Ask your health care provider if you are at risk. Ask your health care provider about whether you are at high risk for HIV. Your health care provider  may recommend a prescription medicine to help prevent HIV infection. If you choose to take medicine to prevent HIV, you should first get tested for HIV. You should then be tested every 3 months for as long as you are taking the medicine. Follow these instructions at home: Alcohol use Do not drink alcohol if your health care provider tells you not to drink. If you drink alcohol: Limit how much you have to 0-2 drinks a day. Know how much alcohol is in your drink. In the [place] one drink equals one 12 oz bottle of beer (355 mL), one 5 oz glass of wine (148 mL), or one 1 oz glass of hard liquor (44 mL). Lifestyle Do not use any products that contain nicotine or tobacco. These products include cigarettes, chewing tobacco, and vaping devices, such as e-cigarettes. If you need help quitting, ask your health care provider. Do not use street drugs. Do not share needles. Ask your health care provider for help if you need support or information about quitting drugs. General instructions Schedule regular health, dental, and eye exams. Stay current with your vaccines. Tell your health care provider if: You often feel depressed. You have ever been abused or do not feel safe at home. Summary Adopting a healthy lifestyle and getting preventive care are important in promoting health and wellness. Follow your health care provider's instructions about healthy diet, exercising, and getting tested or screened for diseases. Follow your health care provider's instructions on monitoring your cholesterol and blood pressure. This information is not intended to replace advice given to you by your health care provider. Make sure you discuss any questions you have with your health care provider. Document Revised: 06/16/2020 Document Reviewed: 06/16/2020 Elsevier Patient Education  2024 Elsevier Inc.  

## 2022-12-17 LAB — CBC
HCT: 53.5 % — ABNORMAL HIGH (ref 38.5–50.0)
Hemoglobin: 17.2 g/dL — ABNORMAL HIGH (ref 13.2–17.1)
MCH: 28.9 pg (ref 27.0–33.0)
MCHC: 32.1 g/dL (ref 32.0–36.0)
MCV: 89.8 fL (ref 80.0–100.0)
MPV: 9.5 fL (ref 7.5–12.5)
Platelets: 485 10*3/uL — ABNORMAL HIGH (ref 140–400)
RBC: 5.96 10*6/uL — ABNORMAL HIGH (ref 4.20–5.80)
RDW: 13.1 % (ref 11.0–15.0)
WBC: 15.4 10*3/uL — ABNORMAL HIGH (ref 3.8–10.8)

## 2022-12-17 LAB — COMPLETE METABOLIC PANEL WITH GFR
AG Ratio: 1.3 (calc) (ref 1.0–2.5)
ALT: 19 U/L (ref 9–46)
AST: 20 U/L (ref 10–35)
Albumin: 4.1 g/dL (ref 3.6–5.1)
Alkaline phosphatase (APISO): 97 U/L (ref 35–144)
BUN: 17 mg/dL (ref 7–25)
CO2: 23 mmol/L (ref 20–32)
Calcium: 10.1 mg/dL (ref 8.6–10.3)
Chloride: 106 mmol/L (ref 98–110)
Creat: 0.96 mg/dL (ref 0.70–1.35)
Globulin: 3.1 g/dL (ref 1.9–3.7)
Glucose, Bld: 103 mg/dL — ABNORMAL HIGH (ref 65–99)
Potassium: 4.7 mmol/L (ref 3.5–5.3)
Sodium: 140 mmol/L (ref 135–146)
Total Bilirubin: 0.6 mg/dL (ref 0.2–1.2)
Total Protein: 7.2 g/dL (ref 6.1–8.1)
eGFR: 89 mL/min/{1.73_m2} (ref >=60)

## 2022-12-17 LAB — LIPID PANEL
Cholesterol: 113 mg/dL (ref <200)
HDL: 37 mg/dL — ABNORMAL LOW (ref >=40)
LDL Cholesterol (Calc): 57 mg/dL
Non-HDL Cholesterol (Calc): 76 mg/dL (ref <130)
Total CHOL/HDL Ratio: 3.1 (calc) (ref <5.0)
Triglycerides: 103 mg/dL (ref <150)

## 2022-12-17 LAB — HEMOGLOBIN A1C
Hgb A1c MFr Bld: 5.9 %{Hb} — ABNORMAL HIGH (ref <5.7)
Mean Plasma Glucose: 123 mg/dL
eAG (mmol/L): 6.8 mmol/L

## 2022-12-17 LAB — PSA: PSA: 0.86 ng/mL (ref <=4.00)

## 2022-12-22 ENCOUNTER — Encounter: Payer: Self-pay | Admitting: Internal Medicine

## 2022-12-31 ENCOUNTER — Encounter: Payer: Self-pay | Admitting: Cardiology

## 2022-12-31 ENCOUNTER — Ambulatory Visit: Payer: PPO | Attending: Cardiology | Admitting: Cardiology

## 2022-12-31 VITALS — BP 112/76 | HR 96 | Ht 71.0 in | Wt 166.2 lb

## 2022-12-31 DIAGNOSIS — E785 Hyperlipidemia, unspecified: Secondary | ICD-10-CM

## 2022-12-31 DIAGNOSIS — I252 Old myocardial infarction: Secondary | ICD-10-CM | POA: Diagnosis not present

## 2022-12-31 DIAGNOSIS — G4733 Obstructive sleep apnea (adult) (pediatric): Secondary | ICD-10-CM

## 2022-12-31 DIAGNOSIS — R252 Cramp and spasm: Secondary | ICD-10-CM | POA: Diagnosis not present

## 2022-12-31 DIAGNOSIS — F17210 Nicotine dependence, cigarettes, uncomplicated: Secondary | ICD-10-CM

## 2022-12-31 DIAGNOSIS — E1169 Type 2 diabetes mellitus with other specified complication: Secondary | ICD-10-CM

## 2022-12-31 DIAGNOSIS — I255 Ischemic cardiomyopathy: Secondary | ICD-10-CM | POA: Diagnosis not present

## 2022-12-31 DIAGNOSIS — J439 Emphysema, unspecified: Secondary | ICD-10-CM

## 2022-12-31 DIAGNOSIS — I25119 Atherosclerotic heart disease of native coronary artery with unspecified angina pectoris: Secondary | ICD-10-CM

## 2022-12-31 DIAGNOSIS — I2089 Other forms of angina pectoris: Secondary | ICD-10-CM

## 2022-12-31 DIAGNOSIS — I10 Essential (primary) hypertension: Secondary | ICD-10-CM

## 2022-12-31 DIAGNOSIS — I5032 Chronic diastolic (congestive) heart failure: Secondary | ICD-10-CM

## 2022-12-31 NOTE — Patient Instructions (Signed)
Medication Instructions:  No changes at this time  *If you need a refill on your cardiac medications before your next appointment, please call your pharmacy*   Lab Work: None needed   Testing/Procedures: None    Follow-Up: At [facility], you and your health needs are our priority.  As part of our continuing mission to provide you with exceptional heart care, we have created designated Provider Care Teams.  These Care Teams include your primary Cardiologist (physician) and Advanced Practice Providers (APPs -  Physician Assistants and Nurse Practitioners) who all work together to provide you with the care you need, when you need it.   Your next appointment:   6 month(s)  Provider:   Bryan Lemma, MD     Other Instructions Okay to donate blood  Try E-cigarette or Nicorette gum to help with smoking cessation  Your physician discussed the hazards of tobacco use. Tobacco use cessation is recommended and techniques and options to help you quit were discussed.

## 2022-12-31 NOTE — Progress Notes (Signed)
Cardiology Office Note:  .   Date:  01/03/2023  ID:  Troy Stanford., DOB 03/05/1960, MRN [id number] PCP: Lorre Munroe, NP  [facility] Providers Cardiologist:  Bryan Lemma, MD     Chief Complaint  Patient presents with   Follow-up    Doing well.  Only issues in the back smoking almost a pack a day.   Coronary Artery Disease    No angina    Patient Profile: .     Troy Parrish. is a 62 y.o. male recurrent smoker with a PMH reviewed below who presents here for delayed 68-month follow-up at the request of Lorre Munroe, NP. Pertinent PMH: Longstanding MV CAD-PCI (has had a STEMI of all 3 major arteries),  Last PCI was in 2018 Last cath 2020-patent stents Is on maintenance Effient because of Plavix nonresponse and intolerance of Brilinta. ICM with EF roughly 45%-NYHA Class I CHF,  HLD on multiple medications -> most recent TC 114, LDL 53, TG 154, HDL 37 HTN  DM-2-diet controlled.  A1c 5.87 08/2021 Long-term smoker-now fully quit after several prolonged attempts. Mild associated COPD Erectile dysfunction-PRN Viagra    Troy Parrish. was last seen on March 22 for routine 31-month follow-up doing well.  He did recently return to [place] after his mother passed away and admits to doing other several trips for happier reasons as well.  And his level down a little bit but was dealing with a cold and recent pneumonia.  Other than that doing well.  No angina symptoms with rest or exertion.  Still trying to fully quit smoking but tends to go back to it.  No STEMI does not require burst quickly from his trips but still enjoys his trips.  No med changes.  Smoking cessation counseling provided.  Subjective  Discussed the use of AI scribe software for clinical note transcription with the patient, who gave verbal consent to proceed.  History of Present Illness   The patient, a former (now recurrent) smoker with extensive CAD history who presents with a history of heart disease  and recent relapse into smoking, currently up to a pack a day. He reports feeling more out of breath and experiencing occasional sharp chest pains, although these are infrequent and have been a long-standing issue. The patient's blood pressure and heart rate have been well-controlled, and he has not experienced any heart racing or skipping.   He is stable cardiac standpoint with no active exertional chest pain or pressure.  He does have some exertional dyspnea from smoking which does seem to be a little bit worse now that he is gone back to smoking.  Denies any PND, orthopnea or edema.  No rapid irregular beats palpitations.  No syncope/near syncope or TIA/amaurosis fugax or claudication.   The patient also reports a recent neurological issue with his left hand, where his fingers would suddenly become contorted. This was evaluated by a hand specialist and a neurologist, with no bone issues identified. The neurologist found slightly low levels of vitamin D and B12, which were thought to possibly contribute to the issue.  The patient has been trying to quit smoking again but has found it challenging. He has previously tried Chantix, which was effective, but was recalled before he could complete a second course. He has also tried Wellbutrin, but it caused severe headaches. The patient is considering trying e-cigarettes or Nicorette gum as a means to quit smoking.  The patient has a history  of travel, which he enjoys, and has recently returned from a trip. He reports no other significant health issues. The patient's lab results, including cholesterol, A1c, creatinine, hemoglobin, potassium, and TSH, were all within normal ranges. The patient's hemoglobin was slightly high, which may be due to smoking.      Cardiovascular ROS:  Reviewed above  ROS:  Review of Systems - Negative except mild cough and congestion from smoking.  Otherwise pretty stable    Objective   Current Meds  Medication Sig    albuterol (VENTOLIN HFA) 108 (90 Base) MCG/ACT inhaler INHALE TWO PUFFS BY MOUTH EVERY 6 HOURS AS NEEDED FOR SHORTNESS OF BREATH   clobetasol cream (TEMOVATE) 0.05 % Apply 1 Application topically daily as needed (eczema).   fenofibrate (TRICOR) 48 MG tablet TAKE 1 TABLET BY MOUTH DAILY   fluticasone-salmeterol (ADVAIR) 100-50 MCG/ACT AEPB INHALE 1 PUFF BY MOUTH TWICE A DAY IN THE MORNING AND IN THE EVENING   gabapentin (NEURONTIN) 100 MG capsule Take 100 mg by mouth at bedtime.   ketoconazole (NIZORAL) 2 % shampoo Apply 1 Application topically 2 (two) times a week.   Lancets (ONETOUCH DELICA PLUS LANCET33G) MISC USE TO TEST BLOOD SUGAR UP TO 4 TIMES DAILY   metoprolol succinate (TOPROL-XL) 25 MG 24 hr tablet TAKE 1 AND 1/2 TABLET BY MOUTH EVERY EVENING   Omega-3 Fatty Acids (FISH OIL) 1000 MG CPDR Take 3 g by mouth daily.   omeprazole (PRILOSEC) 40 MG capsule TAKE 1 CAPSULE BY MOUTH DAILY   ONETOUCH ULTRA test strip USE 1 STRIP TO TEST 4 TIMES A DAY   prasugrel (EFFIENT) 10 MG TABS tablet Take 1 tablet (10 mg total) by mouth daily.   rosuvastatin (CRESTOR) 40 MG tablet TAKE ONE TABLET BY MOUTH DAILY   sildenafil (VIAGRA) 50 MG tablet TAKE 1/2 TO 1 TABLET BY MOUTH AS NEEDED FOR ERECTILE DYSFUNCTION   SH: Back up to 1 PPD smoking.  Wants to quit, and often does not finish more than about a half a cigarette. Had previously done well with bupropion but then went back.  Had quit with Chantix, but now Chantix is removed from the market.  Studies Reviewed: Marland Kitchen   EKG Interpretation Date/Time:  Friday December 31 2022 16:02:10 EST Ventricular Rate:  96 PR Interval:  136 QRS Duration:  94 QT Interval:  370 QTC Calculation: 467 R Axis:   53  Text Interpretation: Normal sinus rhythm Nonspecific ST abnormality When compared with ECG of 26-Jan-2022 22:09, Nonspecific T wave abnormality has replaced inverted T waves in Inferior leads Confirmed by Bryan Lemma ([id number]) on 12/31/2022 4:29:28 PM     LABS Total cholesterol: 113 mg/dL (32/44/0102) HDL: 37 mg/dL (72/53/6644) LDL: 57 mg/dL (03/47/4259) Triglycerides: 103 mg/dL (56/38/7564) P3I: 9.5% (12/16/2022) Creatinine: 0.96 mg/dL (18/84/1660) Hemoglobin: 17.2 g/dL (63/02/6008) Potassium: 4.7 mmol/L (12/16/2022)  ECHO 03/2015:  LVEF 45-50%. Inferior posterior hypokinesis with mild dilation. Apparent normal diastolic pressures; 05/2016; OM1 PCI: SYNERGY DES 2.5X16 -overlaps OLD stent. Cath February 13, 2018 similar findings when compared to prior cath.   Risk Assessment/Calculations:        Physical Exam:   VS:  BP 112/76 (BP Location: Left Arm, Patient Position: Sitting, Cuff Size: Normal)   Pulse 96   Ht 5\' 11"  (1.803 m)   Wt 166 lb 3.2 oz (75.4 kg)   SpO2 96%   BMI 23.18 kg/m    Wt Readings from Last 3 Encounters:  12/31/22 166 lb 3.2 oz (75.4 kg)  12/16/22 163 lb (  73.9 kg)  06/15/22 177 lb (80.3 kg)    GEN: Well nourished, well developed in no acute distress; healthy-appearing.  Has lost weight since last visit.  Other than poor dentition, healthy-appearing. NECK: No JVD; No carotid bruits CARDIAC: RRR, distant but normal S1, S2;  no murmurs, rubs, gallops RESPIRATORY: Mild faint forced expiratory wheeze, otherwise clear to auscultation without rales, or rhonchi ; nonlabored, good air movement. ABDOMEN: Soft, non-tender, non-distended EXTREMITIES:  No edema; No deformity    ASSESSMENT AND PLAN: .    Problem List Items Addressed This Visit       Cardiology Problems   Cardiomyopathy, ischemic; EF~45% with hypokinesis of the inferior and inferolateral myocardium; CO 3.25 (Chronic)    EF 45 to 50%.  Not unexpectedly has inferior hypokinesis as a result of RCA STEMI and non-STEMI as well as OM STEMI. Despite this, he really does not really have any CHF symptoms other than exertional dyspnea which is probably more related to COPD. NYHA class I-II.  Stable.  Euvolemic.  On Toprol. No diuretic requirement.       Relevant Orders   EKG 12-Lead (Completed)   Coronary artery disease involving native coronary artery of native heart with angina pectoris (HCC) (Chronic)    Doing well actually with has done well with no significant angina since most recent PCI on the current dose of Toprol.  In the past when we reduced the dose Toprol, he had more palpitations and chest discomfort.  -Continue Toprol 37.45 mg daily-taking in the evening -Continue combination of Crestor 40 mg daily and Tricor 48 mg along with fish oil, lipids well-controlled -Remains on maintenance dose Effient as all 3 major coronaries have stents.  Had worsening dyspnea with Brilinta  Okay to hold Effient 7 to 9 days preop for surgeries or procedures.      Relevant Orders   EKG 12-Lead (Completed)   Essential hypertension - Primary (Chronic)    BP stable on 37.5 mg Toprol -> we tried to reduce beta-blocker dose in the past and he had tachycardia. - Continue current dose.      Relevant Orders   EKG 12-Lead (Completed)   Hyperlipidemia associated with type 2 diabetes mellitus (HCC) (Chronic)    Most recent LDL well-controlled-57 on 40 mg rosuvastatin/Crestor, fenofibrate/Tricor 48 mg and fish oil. -Continue 40 mg Crestor and 40 mg Tricor along with fish oil 3 g daily  Most recent A1c was 5.9.  Has done well.  He checks his blood sugars but is not currently on medications.  Labs followed by PCP.        Other   COPD (chronic obstructive pulmonary disease) (HCC) (Chronic)    Smoking cessation encouraged.  He is on Advair and Flonase with PRN albuterol.   -Continue current meds. Smoking cessation counseling      Hand or foot spasms    Patient reports previous episodes of left hand spasms, particularly in the index finger. No pain associated. Symptoms resolved with Gabapentin and vitamin supplementation. -Continue Gabapentin as prescribed and maintain vitamin supplementation.      Heavy cigarette smoker (Chronic)    Patient  reports increased smoking up to a pack per day despite previous successful cessation. Experiencing increased cough and shortness of breath. Discussed various cessation aids including e-cigarettes, Nicorette gum, and nicotine patches. -Consider use of e-cigarettes or Nicorette gum for nicotine replacement. -Consider blood donation to reduce high hemoglobin likely secondary to smoking.  Smoking cessation instruction/counseling given:  counseled patient on the dangers  of tobacco use, advised patient to stop smoking, and reviewed strategies to maximize success;  5 minutes  Lung Cancer Screening Patient requests lung cancer screening due to history of heavy smoking. Previous screening was seven years ago. -Refer to pulmonology for lung cancer screening via chest CT.        History of ST elevation myocardial infarction (STEMI) (Chronic)    He has had a history of STEMI involving the all 3 major arteries with non-STEMI as well.  PCI to all 3 arteries.  Despite all this, his EF has remained stable.  Not currently having any anginal symptoms on stable regimen.  Mildly reduced EF with no heart failure symptoms.      OSA (obstructive sleep apnea) (Chronic)    Does not always use CPAP.          Follow-Up: Return in about 6 months (around 06/30/2023) for 6 month follow-up with me, Routine follow up with me, Follow-up in [facility] office.  Total time spent: 28 min spent with patient + 15 min spent charting = 43 min     Signed, Marykay Lex, MD, MS Bryan Lemma, M.D., M.S. Interventional Cardiologist  [facility]  Pager # [phone] Phone # [phone] [address] [place], [place] [address]

## 2023-01-03 ENCOUNTER — Encounter: Payer: Self-pay | Admitting: Cardiology

## 2023-01-03 DIAGNOSIS — R252 Cramp and spasm: Secondary | ICD-10-CM | POA: Insufficient documentation

## 2023-01-03 DIAGNOSIS — F17210 Nicotine dependence, cigarettes, uncomplicated: Secondary | ICD-10-CM | POA: Insufficient documentation

## 2023-01-03 NOTE — Assessment & Plan Note (Signed)
Patient reports previous episodes of left hand spasms, particularly in the index finger. No pain associated. Symptoms resolved with Gabapentin and vitamin supplementation. -Continue Gabapentin as prescribed and maintain vitamin supplementation.

## 2023-01-03 NOTE — Assessment & Plan Note (Signed)
Overall stable for cardiovascular standpoint.  Major point of contention at this point is smoking cessation.

## 2023-01-03 NOTE — Assessment & Plan Note (Signed)
He has had a history of STEMI involving the all 3 major arteries with non-STEMI as well.  PCI to all 3 arteries.  Despite all this, his EF has remained stable.  Not currently having any anginal symptoms on stable regimen.  Mildly reduced EF with no heart failure symptoms.

## 2023-01-03 NOTE — Assessment & Plan Note (Signed)
BP stable on 37.5 mg Toprol -> we tried to reduce beta-blocker dose in the past and he had tachycardia. - Continue current dose.

## 2023-01-03 NOTE — Assessment & Plan Note (Addendum)
Most recent LDL well-controlled-57 on 40 mg rosuvastatin/Crestor, fenofibrate/Tricor 48 mg and fish oil. -Continue 40 mg Crestor and 40 mg Tricor along with fish oil 3 g daily  Most recent A1c was 5.9.  Has done well.  He checks his blood sugars but is not currently on medications.  Labs followed by PCP.

## 2023-01-03 NOTE — Assessment & Plan Note (Signed)
Does not always use CPAP.

## 2023-01-03 NOTE — Assessment & Plan Note (Signed)
Smoking cessation encouraged.  He is on Advair and Flonase with PRN albuterol.   -Continue current meds. Smoking cessation counseling

## 2023-01-03 NOTE — Assessment & Plan Note (Signed)
Doing well actually with has done well with no significant angina since most recent PCI on the current dose of Toprol.  In the past when we reduced the dose Toprol, he had more palpitations and chest discomfort.  -Continue Toprol 37.45 mg daily-taking in the evening -Continue combination of Crestor 40 mg daily and Tricor 48 mg along with fish oil, lipids well-controlled -Remains on maintenance dose Effient as all 3 major coronaries have stents.  Had worsening dyspnea with Brilinta  Okay to hold Effient 7 to 9 days preop for surgeries or procedures.

## 2023-01-03 NOTE — Assessment & Plan Note (Signed)
EF 45 to 50%.  Not unexpectedly has inferior hypokinesis as a result of RCA STEMI and non-STEMI as well as OM STEMI. Despite this, he really does not really have any CHF symptoms other than exertional dyspnea which is probably more related to COPD. NYHA class I-II.  Stable.  Euvolemic.  On Toprol. No diuretic requirement.

## 2023-01-03 NOTE — Assessment & Plan Note (Addendum)
Patient reports increased smoking up to a pack per day despite previous successful cessation. Experiencing increased cough and shortness of breath. Discussed various cessation aids including e-cigarettes, Nicorette gum, and nicotine patches. -Consider use of e-cigarettes or Nicorette gum for nicotine replacement. -Consider blood donation to reduce high hemoglobin likely secondary to smoking.  Smoking cessation instruction/counseling given:  counseled patient on the dangers of tobacco use, advised patient to stop smoking, and reviewed strategies to maximize success;  5 minutes  Lung Cancer Screening Patient requests lung cancer screening due to history of heavy smoking. Previous screening was seven years ago. -Refer to pulmonology for lung cancer screening via chest CT.

## 2023-01-14 ENCOUNTER — Other Ambulatory Visit: Payer: Self-pay | Admitting: Internal Medicine

## 2023-01-14 DIAGNOSIS — I5042 Chronic combined systolic (congestive) and diastolic (congestive) heart failure: Secondary | ICD-10-CM

## 2023-01-17 NOTE — Telephone Encounter (Signed)
Requested Prescriptions  Pending Prescriptions Disp Refills   fluticasone-salmeterol (ADVAIR) 100-50 MCG/ACT AEPB [Pharmacy Med Name: FLUTICASONE-SALMETEROL 100-50] 180 each 1    Sig: INHALE 1 PUFF BY MOUTH 2 TIMES A DAY - IN THE MORNING AND IN THE EVENING     Pulmonology:  Combination Products Passed - 01/14/2023  9:34 AM      Passed - Valid encounter within last 12 months    Recent Outpatient Visits           1 month ago Encounter for general adult medical examination with abnormal findings   [place] [facility] [place], Salvadore Oxford, NP   7 months ago Aortic atherosclerosis [facility])   [facility] [place], Salvadore Oxford, NP   10 months ago Impacted cerumen of right ear   [facility] Smitty Cords, DO   1 year ago Viral URI with cough   [facility] [place], Salvadore Oxford, NP   1 year ago Encounter for general adult medical examination with abnormal findings   [place] [facility] [place], Salvadore Oxford, NP       Future Appointments             In 4 months [place], Salvadore Oxford, NP [facility], [facility]

## 2023-01-18 ENCOUNTER — Other Ambulatory Visit: Payer: Self-pay

## 2023-01-18 DIAGNOSIS — Z87891 Personal history of nicotine dependence: Secondary | ICD-10-CM

## 2023-01-18 DIAGNOSIS — F17210 Nicotine dependence, cigarettes, uncomplicated: Secondary | ICD-10-CM

## 2023-01-18 DIAGNOSIS — Z122 Encounter for screening for malignant neoplasm of respiratory organs: Secondary | ICD-10-CM

## 2023-01-20 ENCOUNTER — Other Ambulatory Visit: Payer: Self-pay | Admitting: Cardiology

## 2023-01-21 ENCOUNTER — Ambulatory Visit: Payer: PPO | Admitting: Acute Care

## 2023-01-21 DIAGNOSIS — F17210 Nicotine dependence, cigarettes, uncomplicated: Secondary | ICD-10-CM | POA: Diagnosis not present

## 2023-01-21 NOTE — Patient Instructions (Signed)
Thank you for participating in the [facility]. It was our pleasure to meet you today. We will call you with the results of your scan within the next few days. Your scan will be assigned a Lung RADS category score by the physicians reading the scans.  This Lung RADS score determines follow up scanning.  See below for description of categories, and follow up screening recommendations. We will be in touch to schedule your follow up screening annually or based on recommendations of our providers. We will fax a copy of your scan results to your Primary Care Physician, or the physician who referred you to the program, to ensure they have the results. Please call the office if you have any questions or concerns regarding your scanning experience or results.  Our office number is [phone]. Please speak with Abigail Miyamoto, RN., Karlton Lemon RN, or Pietro Cassis RN. They are  our Lung Cancer Screening RN.'s If They are unavailable when you call, Please leave a message on the voice mail. We will return your call at our earliest convenience.This voice mail is monitored several times a day.  Remember, if your scan is normal, we will scan you annually as long as you continue to meet the criteria for the program. (Age 62-80, Current smoker or smoker who has quit within the last 15 years). If you are a smoker, remember, quitting is the single most powerful action that you can take to decrease your risk of lung cancer and other pulmonary, breathing related problems. We know quitting is hard, and we are here to help.  Please let us know if there is anything we can do to help you meet your goal of quitting. If you are a former smoker, Counselling psychologist. We are proud of you! Remain smoke free! Remember you can refer friends or family members through the number above.  We will screen them to make sure they meet criteria for the program. Thank you for helping [place] take better care of you  by participating in Lung Screening.   Lung RADS Categories:  Lung RADS 1: no nodules or definitely non-concerning nodules.  Recommendation is for a repeat annual scan in 12 months.  Lung RADS 2:  nodules that are non-concerning in appearance and behavior with a very low likelihood of becoming an active cancer. Recommendation is for a repeat annual scan in 12 months.  Lung RADS 3: nodules that are probably non-concerning , includes nodules with a low likelihood of becoming an active cancer.  Recommendation is for a 66-month repeat screening scan. Often noted after an upper respiratory illness. We will be in touch to make sure you have no questions, and to schedule your 65-month scan.  Lung RADS 4 A: nodules with concerning findings, recommendation is most often for a follow up scan in 3 months or additional testing based on our provider's assessment of the scan. We will be in touch to make sure you have no questions and to schedule the recommended 3 month follow up scan.  Lung RADS 4 B:  indicates findings that are concerning. We will be in touch with you to schedule additional diagnostic testing based on our provider's  assessment of the scan.  You can receive free nicotine replacement therapy ( patches, gum or mints) by calling 1-800-QUIT NOW. Please call so we can get you on the path to becoming  a non-smoker. I know it is hard, but you can do this!  Other options for assistance in  smoking cessation ( As covered by your insurance benefits)  Hypnosis for smoking cessation  [employer] [phone]  Acupuncture for smoking cessation  [employer] [phone]  Derrek Gu

## 2023-01-21 NOTE — Progress Notes (Addendum)
  Provider Attestation I agree with the documentation of the Shared Decision Making visit,  smoking cessation counseling if appropriate, and verification or eligibility for lung cancer screening as documented by the RN Nurse Navigator.   Lauraine PHEBE Lites, MSN, AGACNP-BC [facility] See Amion for personal pager PCCM on call pager [phone]    Virtual Visit via Telephone Note  I connected with Troy Parrish. on 01/21/23 at 11:00 AM EST by telephone and verified that I am speaking with the correct person using two identifiers.  Location: Patient: Troy Parrish Provider: Laneta Speaks, RN   I discussed the limitations, risks, security and privacy concerns of performing an evaluation and management service by telephone and the availability of in person appointments. I also discussed with the patient that there may be a patient responsible charge related to this service. The patient expressed understanding and agreed to proceed.    Shared Decision Making Visit Lung Cancer Screening Program [phone])   Eligibility: Age 62 y.o. Pack Years Smoking History Calculation 105 (# packs/per year x # years smoked) Recent History of coughing up blood  no Unexplained weight loss? no ( >Than 15 pounds within the last 6 months ) Prior History Lung / other cancer no (Diagnosis within the last 5 years already requiring surveillance chest CT Scans). Smoking Status Current Smoker Former Smokers: Years since quit: n/a  Quit Date: n/a  Visit Components: Discussion included one or more decision making aids. yes Discussion included risk/benefits of screening. yes Discussion included potential follow up diagnostic testing for abnormal scans. yes Discussion included meaning and risk of over diagnosis. yes Discussion included meaning and risk of False Positives. yes Discussion included meaning of total radiation exposure. yes  Counseling Included: Importance of  adherence to annual lung cancer LDCT screening. yes Impact of comorbidities on ability to participate in the program. yes Ability and willingness to under diagnostic treatment. yes  Smoking Cessation Counseling: Current Smokers:  Discussed importance of smoking cessation. yes Information about tobacco cessation classes and interventions provided to patient. yes Patient provided with ticket for LDCT Scan. yes Symptomatic Patient. no  Counseling(Intermediate counseling: > three minutes) 99406 Diagnosis Code: Tobacco Use Z72.0 Asymptomatic Patient yes  Counseling (Intermediate counseling: > three minutes counseling) [id number] Former Smokers:  Discussed the importance of maintaining cigarette abstinence. yes Diagnosis Code: Personal History of Nicotine Dependence. S12.108 Information about tobacco cessation classes and interventions provided to patient. Yes Patient provided with ticket for LDCT Scan. no Written Order for Lung Cancer Screening with LDCT placed in Epic. Yes (CT Chest Lung Cancer Screening Low Dose W/O CM) [id number] Z12.2-Screening of respiratory organs Z87.891-Personal history of nicotine dependence   Laneta Speaks, RN

## 2023-01-25 ENCOUNTER — Ambulatory Visit
Admission: RE | Admit: 2023-01-25 | Discharge: 2023-01-25 | Disposition: A | Discharge status: Home / Self Care | Payer: PPO | Source: Ambulatory Visit | Attending: Acute Care | Admitting: Acute Care

## 2023-01-25 DIAGNOSIS — F17210 Nicotine dependence, cigarettes, uncomplicated: Secondary | ICD-10-CM | POA: Diagnosis not present

## 2023-01-25 DIAGNOSIS — Z87891 Personal history of nicotine dependence: Secondary | ICD-10-CM | POA: Diagnosis not present

## 2023-01-25 DIAGNOSIS — Z122 Encounter for screening for malignant neoplasm of respiratory organs: Secondary | ICD-10-CM | POA: Diagnosis not present

## 2023-02-07 ENCOUNTER — Telehealth: Payer: Self-pay | Admitting: Cardiology

## 2023-02-07 ENCOUNTER — Other Ambulatory Visit: Payer: Self-pay

## 2023-02-07 DIAGNOSIS — F17210 Nicotine dependence, cigarettes, uncomplicated: Secondary | ICD-10-CM

## 2023-02-07 DIAGNOSIS — Z122 Encounter for screening for malignant neoplasm of respiratory organs: Secondary | ICD-10-CM

## 2023-02-07 DIAGNOSIS — Z87891 Personal history of nicotine dependence: Secondary | ICD-10-CM

## 2023-02-07 NOTE — Telephone Encounter (Signed)
My chart message sent to pt. Forwarding question to primary cardiologist

## 2023-02-07 NOTE — Telephone Encounter (Signed)
We are well aware of cholesterol plaque in arteries - that's why all of the heart attacks & stents --> this is not unexpected findings.   DH

## 2023-02-07 NOTE — Telephone Encounter (Signed)
Patient stated he received results on his CT CHEST LUNG CA SCREEN LOW DOSE W/O CM test and is concerned it showed plaque in his arteries.  Patient wants Dr. Herbie Baltimore to look at these results and advice on next steps.

## 2023-02-14 NOTE — Telephone Encounter (Signed)
 Patient identification verified by 2 forms. Shade Flood, RN    Provider response relayed to patient. Patient verbalized understanding. No further questions at this time.

## 2023-02-14 NOTE — Telephone Encounter (Signed)
 Pt asked for someone to c/b today about this

## 2023-02-16 ENCOUNTER — Other Ambulatory Visit (HOSPITAL_COMMUNITY): Payer: Self-pay

## 2023-02-16 ENCOUNTER — Other Ambulatory Visit: Payer: Self-pay

## 2023-02-17 DIAGNOSIS — H6061 Unspecified chronic otitis externa, right ear: Secondary | ICD-10-CM | POA: Diagnosis not present

## 2023-02-17 DIAGNOSIS — H6123 Impacted cerumen, bilateral: Secondary | ICD-10-CM | POA: Diagnosis not present

## 2023-02-24 ENCOUNTER — Encounter: Payer: Self-pay | Admitting: Cardiology

## 2023-02-24 NOTE — Telephone Encounter (Signed)
I do not know the answer about that.  I think really as long as you are not donating platelets, you are okay.  I will forward to our pharmacy team to see if they have any opinions.  Bryan Lemma, MD

## 2023-02-27 ENCOUNTER — Other Ambulatory Visit: Payer: Self-pay | Admitting: Cardiology

## 2023-02-28 ENCOUNTER — Other Ambulatory Visit: Payer: Self-pay

## 2023-02-28 MED ORDER — ROSUVASTATIN CALCIUM 40 MG PO TABS: 40.0000 mg | ORAL_TABLET | Freq: Every day | ORAL | 3 refills | Status: AC

## 2023-02-28 NOTE — Telephone Encounter (Signed)
[facility] will not accept donations from patients on anti platelet medications.  Would have to check with them, but would have to be off for at least 3 days (to get completely out of his system), but they would likely require a longer period.

## 2023-03-10 DIAGNOSIS — H6121 Impacted cerumen, right ear: Secondary | ICD-10-CM | POA: Diagnosis not present

## 2023-03-10 DIAGNOSIS — H6061 Unspecified chronic otitis externa, right ear: Secondary | ICD-10-CM | POA: Diagnosis not present

## 2023-03-19 ENCOUNTER — Other Ambulatory Visit: Payer: Self-pay | Admitting: Internal Medicine

## 2023-03-21 NOTE — Telephone Encounter (Signed)
 Requested Prescriptions  Pending Prescriptions Disp Refills   albuterol  (VENTOLIN  HFA) 108 (90 Base) MCG/ACT inhaler [Pharmacy Med Name: ALBUTEROL  HFA 90 MCG INHALER] 25.5 g 1    Sig: INHALE 2 PUFFS BY MOUTH EVERY 6 HOURS AS NEEDED FOR SHORTNESS OF BREATH     Pulmonology:  Beta Agonists 2 Passed - 03/21/2023 11:38 AM      Passed - Last BP in normal range    BP Readings from Last 1 Encounters:  12/31/22 112/76         Passed - Last Heart Rate in normal range    Pulse Readings from Last 1 Encounters:  12/31/22 96         Passed - Valid encounter within last 12 months    Recent Outpatient Visits           3 months ago Encounter for general adult medical examination with abnormal findings   [facility] [place], Rankin Buzzard, NP   9 months ago Aortic atherosclerosis [facility])   [place] [facility] [place], Rankin Buzzard, NP   1 year ago Impacted cerumen of right ear   [place] [facility] Raina Bunting, DO   1 year ago Viral URI with cough   [place] [facility] [place], Rankin Buzzard, NP   1 year ago Encounter for general adult medical examination with abnormal findings   [place] [facility] [place], Rankin Buzzard, NP       Future Appointments             In 2 months Ba[place], Rankin Buzzard, NP [place] [facility], [facility]

## 2023-04-07 DIAGNOSIS — R200 Anesthesia of skin: Secondary | ICD-10-CM | POA: Diagnosis not present

## 2023-04-07 DIAGNOSIS — M79645 Pain in left finger(s): Secondary | ICD-10-CM | POA: Diagnosis not present

## 2023-04-07 DIAGNOSIS — M62838 Other muscle spasm: Secondary | ICD-10-CM | POA: Diagnosis not present

## 2023-04-14 ENCOUNTER — Other Ambulatory Visit: Payer: Self-pay | Admitting: Cardiology

## 2023-05-11 DIAGNOSIS — H6123 Impacted cerumen, bilateral: Secondary | ICD-10-CM | POA: Diagnosis not present

## 2023-05-11 DIAGNOSIS — H6983 Other specified disorders of Eustachian tube, bilateral: Secondary | ICD-10-CM | POA: Diagnosis not present

## 2023-05-13 ENCOUNTER — Other Ambulatory Visit (HOSPITAL_COMMUNITY): Payer: Self-pay

## 2023-06-14 DIAGNOSIS — M79645 Pain in left finger(s): Secondary | ICD-10-CM | POA: Diagnosis not present

## 2023-06-14 DIAGNOSIS — M62838 Other muscle spasm: Secondary | ICD-10-CM | POA: Diagnosis not present

## 2023-06-15 ENCOUNTER — Ambulatory Visit: Payer: Self-pay | Admitting: Internal Medicine

## 2023-06-15 VITALS — BP 112/68 | Ht 71.0 in | Wt 167.4 lb

## 2023-06-15 DIAGNOSIS — E785 Hyperlipidemia, unspecified: Secondary | ICD-10-CM

## 2023-06-15 DIAGNOSIS — E1169 Type 2 diabetes mellitus with other specified complication: Secondary | ICD-10-CM | POA: Diagnosis not present

## 2023-06-15 DIAGNOSIS — G4733 Obstructive sleep apnea (adult) (pediatric): Secondary | ICD-10-CM | POA: Diagnosis not present

## 2023-06-15 DIAGNOSIS — I10 Essential (primary) hypertension: Secondary | ICD-10-CM | POA: Diagnosis not present

## 2023-06-15 DIAGNOSIS — I25119 Atherosclerotic heart disease of native coronary artery with unspecified angina pectoris: Secondary | ICD-10-CM

## 2023-06-15 DIAGNOSIS — I252 Old myocardial infarction: Secondary | ICD-10-CM | POA: Diagnosis not present

## 2023-06-15 DIAGNOSIS — I700 Atherosclerosis of aorta: Secondary | ICD-10-CM

## 2023-06-15 DIAGNOSIS — J432 Centrilobular emphysema: Secondary | ICD-10-CM

## 2023-06-15 DIAGNOSIS — N522 Drug-induced erectile dysfunction: Secondary | ICD-10-CM

## 2023-06-15 DIAGNOSIS — I5032 Chronic diastolic (congestive) heart failure: Secondary | ICD-10-CM

## 2023-06-15 DIAGNOSIS — I255 Ischemic cardiomyopathy: Secondary | ICD-10-CM

## 2023-06-15 DIAGNOSIS — E118 Type 2 diabetes mellitus with unspecified complications: Secondary | ICD-10-CM | POA: Diagnosis not present

## 2023-06-15 DIAGNOSIS — I2089 Other forms of angina pectoris: Secondary | ICD-10-CM

## 2023-06-15 DIAGNOSIS — K2100 Gastro-esophageal reflux disease with esophagitis, without bleeding: Secondary | ICD-10-CM

## 2023-06-15 MED ORDER — BLOOD GLUCOSE TEST VI STRP: 1.0000 | ORAL_STRIP | Freq: Three times a day (TID) | 0 refills | Status: AC

## 2023-06-15 MED ORDER — LANCETS MISC. MISC: 1.0000 | Freq: Three times a day (TID) | 0 refills | Status: AC

## 2023-06-15 MED ORDER — LANCET DEVICE MISC: 1.0000 | Freq: Three times a day (TID) | 0 refills | Status: AC

## 2023-06-15 MED ORDER — BLOOD GLUCOSE MONITORING SUPPL DEVI: 1.0000 | Freq: Three times a day (TID) | 0 refills | Status: DC

## 2023-06-15 NOTE — Assessment & Plan Note (Signed)
Avoid foods that trigger reflux Continue omeprazole

## 2023-06-15 NOTE — Progress Notes (Signed)
 Subjective:    Patient ID: Troy Reeve., male    DOB: 08-May-1960, 63 y.o.   MRN: [id number]  HPI  Patient presents to clinic today for follow-up of chronic conditions.  HTN: His BP today is 112/68.  He is taking metoprolol  as prescribed.  ECG from 12/2022 reviewed.  HLD with aortic atherosclerosis, CAD/angina status post MI: Status post stents.  His last LDL was 57, triglycerides 409, 12/2022.  He is taking rosuvastatin , fenofibrate , metoprolol , effient  as prescribed.  He follows with cardiology.  CHF with cardiomyopathy: He denies chronic cough, shortness of breath or lower extremity edema.  He is taking metoprolol  as prescribed.  He is not currently on any diuretics.  Echo from 03/2015 reviewed.  OSA: He averages hours of sleep per night without the use of his CPAP.  Sleep study from 04/2013 reviewed.  GERD: Triggered by acidic foods, eating late at night. He denies breakthrough on omeprazole .  Upper GI from 06/2020 reviewed.  Leukocytosis/polycythemia: His last WBC count was 15.4, H/H 17.2/53.5, 12/2022.  He does smoke.  He does not follow with hematology.  COPD: He denies chronic cough or shortness of breath.  He is using advair as prescribed and albuterol  as needed. He does continue to smoke. PFTs from 04/2013 reviewed.  DM2: His last A1c was 5.9%, 12/2022.  His sugar ranges < 150. He is not taking any oral diabetic medication at this time.  He checks his feet routinely.  His last eye exam was 09/2022.  Flu 11/2022.  Pneumovax 12/2018.  Prevnar 13 04/2020.  COVID x 4.  ED: Managed with sildenafil  as needed.  He does not follow with urology.   Review of Systems  Past Medical History:  Diagnosis Date   Bronchitis    Gets bronchitis almost every year   CAD S/P percutaneous coronary angioplasty 06/2011; 6/ & 11/2013   S/P PCI to all 3 major vessels;a)  Ant STEMI 2001- BMS-LAD x2 -->(redo PCI 6/'15 -- pLAD Xience DES 2.5 x 12- 2.75 mm, mLAD 2.25 x 12 - 2.7 mm), b) '06 UA --> Cx- OM  DES; c) 2013 Inf MI BMS mRCA --> d) 10/'15 PCI dRCA Promus P DES 4.0 x 16 (4.25 mm); PTCA of RPL2 (2.0 mm) &RPDA (2.25 mm) 11/2013; 4/18 PCI OM1 Synergy DES  2.5 x 16   COPD (chronic obstructive pulmonary disease) (HCC)    Elevated WBC count    Emphysema of lung (HCC)    Essential hypertension 07/07/2011   Former heavy tobacco smoker     quit in May 2015 after multiple attempts at trying to quit before    GERD (gastroesophageal reflux disease)    Glucose intolerance (pre-diabetes) June 2015    hemoglobin A1c 6.6   Headache    "Imdur  related; stopped taking it; headaches went away" (11/15/2013)   Heart murmur    History of colon polyps    History of stomach ulcers    Hyperlipidemia with target LDL less than 70 07/07/2011   Metabolic syndrome    Pre-diabetes, hypertension and truncal obesity as well as dyslipidemia   OSA on CPAP    Pneumonia "several times"   Recurrent upper respiratory infection (URI)    Seizures (HCC) "several"   "last one was ~ 2011" (11/15/2013)   Shortness of breath dyspnea    ST elevation myocardial infarction (STEMI) of anterior wall (HCC) 2001   History of -- 2 stents in early and distal mid LAD; prior cardiologist was Dr. Lavone Power in  [place]   ST elevation myocardial infarction (STEMI) of inferior wall (HCC) 07/07/2011   Occluded RCA 2.75X18 INTEGRITY; Echo 07/2013: EF 45-50%, Inferior & Posterolateral HK.    Umbilical hernia    Unstable angina (HCC) 2006; 07/2013   Cx-OM - PCI 2.5 mm 13 mm Cypher DES Jereld Monas, [place]) ; 07/2013: Severe ISR of both prox & Distal LAD stentS --> 2 DES stents (1 at prox edge of the proximal stent, 2nd covers the entire distal stent as well as proximal and distal edge stenose)    Upper respiratory infection     Current Outpatient Medications  Medication Sig Dispense Refill   albuterol  (VENTOLIN  HFA) 108 (90 Base) MCG/ACT inhaler INHALE 2 PUFFS BY MOUTH EVERY 6 HOURS AS NEEDED FOR SHORTNESS OF BREATH 25.5 g 1   clobetasol   cream (TEMOVATE ) 0.05 % Apply 1 Application topically daily as needed (eczema). 30 g 0   fenofibrate  (TRICOR ) 48 MG tablet TAKE 1 TABLET BY MOUTH DAILY 90 tablet 2   fluticasone  (FLONASE ) 50 MCG/ACT nasal spray Place 2 sprays into both nostrils daily. 16 g 0   fluticasone -salmeterol (ADVAIR) 100-50 MCG/ACT AEPB INHALE 1 PUFF BY MOUTH 2 TIMES A DAY - IN THE MORNING AND IN THE EVENING 180 each 1   gabapentin (NEURONTIN) 100 MG capsule Take 100 mg by mouth at bedtime.     ketoconazole  (NIZORAL ) 2 % shampoo Apply 1 Application topically 2 (two) times a week. 120 mL 0   Lancets (ONETOUCH DELICA PLUS LANCET33G) MISC USE TO TEST BLOOD SUGAR UP TO 4 TIMES DAILY 100 each 3   metoprolol  succinate (TOPROL -XL) 25 MG 24 hr tablet TAKE 1 AND 1/2 TABLET BY MOUTH EVERY EVENING 135 tablet 3   nitroGLYCERIN  (NITROSTAT ) 0.4 MG SL tablet Place 1 tablet (0.4 mg total) under the tongue every 5 (five) minutes as needed for chest pain. (Patient not taking: Reported on 12/31/2022) 25 tablet 11   Omega-3 Fatty Acids (FISH OIL ) 1000 MG CPDR Take 3 g by mouth daily. 30 capsule 11   omeprazole  (PRILOSEC) 40 MG capsule TAKE 1 CAPSULE BY MOUTH DAILY 90 capsule 3   ONETOUCH ULTRA test strip USE 1 STRIP TO TEST 4 TIMES A DAY 200 strip 1   prasugrel  (EFFIENT ) 10 MG TABS tablet Take 1 tablet (10 mg total) by mouth daily. 90 tablet 3   rosuvastatin  (CRESTOR ) 40 MG tablet TAKE 1 TABLET BY MOUTH DAILY 90 tablet 3   rosuvastatin  (CRESTOR ) 40 MG tablet Take 1 tablet (40 mg total) by mouth daily. 90 tablet 3   sildenafil  (VIAGRA ) 50 MG tablet TAKE 1/2 TO 1 TABLET BY MOUTH AS NEEDED FOR ERECTILE DYSFUNCTION 10 tablet 6   No current facility-administered medications for this visit.    Allergies  Allergen Reactions   Niacin  And Related Hives and Itching    Family History  Problem Relation Age of Onset   Coronary artery disease Father    Heart disease Father    Stroke Father    Hypertension Father    Hyperlipidemia Mother     Diabetes Mother    Hyperlipidemia Maternal Grandmother    Diabetes Maternal Grandmother    Hyperlipidemia Maternal Grandfather    Diabetes Maternal Grandfather    Emphysema Maternal Grandfather    Heart disease Paternal Grandmother    Heart disease Paternal Grandfather    Cancer Maternal Aunt        breast    Social History   Socioeconomic History   Marital status: Married  Spouse name: Not on file   Number of children: 8   Years of education: Not on file   Highest education level: Not on file  Occupational History   Not on file  Tobacco Use   Smoking status: Every Day    Current packs/day: 2.25    Average packs/day: 2.3 packs/day for 48.3 years (108.8 ttl pk-yrs)    Types: Cigarettes    Start date: 79   Smokeless tobacco: Never   Tobacco comments:    Quit with Bupropion  150mg  - > unfortunately, he restarted; has cut all the way down to maybe 1 or 2 cigarettes a day, some days he goes without any.-Some days even multiple days without cigarettes.    12/31/2022 patient smokes about 14 cigarettes daily  Vaping Use   Vaping status: Never Used  Substance and Sexual Activity   Alcohol use: Yes    Comment: rare   Drug use: No   Sexual activity: Not on file  Other Topics Concern   Not on file  Social History Narrative   He is a married father of 8, grandfather of 8. He does not really get routine exercise.    He is down to 1-2 cigarettes a day-sometimes going more than 2 to 3 days without 1.. He is very seriously wanting to quit.     He has a social alcoholic beverage every now and then.       He is no longer working, medically retired essentially.  His wife still works.  He and his wife enjoy traveling have done multiple trips and vacations over the last couple years including several trips to [place]  to be near her family.      Late summer 2023-2-week trip to [place]  [place], and then drove up to the [employer] including seeing [place] [facility].  Lots of walking and hiking.   Social Drivers of Corporate investment banker Strain: Low Risk  (10/15/2022)   Overall Financial Resource Strain (CARDIA)    Difficulty of Paying Living Expenses: Not hard at all  Food Insecurity: No Food Insecurity (10/15/2022)   Hunger Vital Sign    Worried About Running Out of Food in the Last Year: Never true    Ran Out of Food in the Last Year: Never true  Transportation Needs: No Transportation Needs (10/15/2022)   PRAPARE - Administrator, Civil Service (Medical): No    Lack of Transportation (Non-Medical): No  Physical Activity: Inactive (10/15/2022)   Exercise Vital Sign    Days of Exercise per Week: 0 days    Minutes of Exercise per Session: 0 min  Stress: No Stress Concern Present (10/15/2022)   [employer] of Occupational Health - Occupational Stress Questionnaire    Feeling of Stress : Not at all  Social Connections: Moderately Integrated (10/15/2022)   Social Connection and Isolation Panel [NHANES]    Frequency of Communication with Friends and Family: More than three times a week    Frequency of Social Gatherings with Friends and Family: More than three times a week    Attends Religious Services: 1 to 4 times per year    Active Member of [employer] or Organizations: No    Attends Banker Meetings: Never    Marital Status: Married  Catering manager Violence: Not At Risk (10/15/2022)   Humiliation, Afraid, Rape, and Kick questionnaire    Fear of Current or Ex-Partner: No    Emotionally Abused: No  Physically Abused: No    Sexually Abused: No     Constitutional: Denies fever, malaise, fatigue, headache or abrupt weight changes.  HEENT: Pt reports hoarseness. Denies eye pain, eye redness, ear pain, ringing in the ears, wax buildup, runny nose, nasal congestion, bloody nose, or sore throat. Respiratory: Denies difficulty breathing, shortness of breath, cough or sputum production.   Cardiovascular: Denies chest  pain, chest tightness, palpitations or swelling in the hands or feet.  Gastrointestinal: Denies abdominal pain, bloating, constipation, diarrhea or blood in the stool.  GU: Patient reports erectile dysfunction.  Denies urgency, frequency, pain with urination, burning sensation, blood in urine, odor or discharge. Musculoskeletal: Denies decrease in range of motion, difficulty with gait, or joint pain and swelling.  Skin: Denies redness, rashes, lesions or ulcercations.  Neurological: Denies dizziness, difficulty with memory, difficulty with speech or problems with balance and coordination.  Psych: Denies anxiety, depression, SI/HI.  No other specific complaints in a complete review of systems (except as listed in HPI above).     Objective:   Physical Exam  BP 112/68 (BP Location: Left Arm, Patient Position: Sitting, Cuff Size: Normal)   Ht 5\' 11"  (1.803 m)   Wt 167 lb 6.4 oz (75.9 kg)   BMI 23.35 kg/m    Wt Readings from Last 3 Encounters:  12/31/22 166 lb 3.2 oz (75.4 kg)  12/16/22 163 lb (73.9 kg)  06/15/22 177 lb (80.3 kg)    General: Appears his stated age, well developed, well nourished in NAD. Skin: Warm, dry and intact. No ulcerations noted. HEENT: Head: normal shape and size; Eyes: sclera white, no icterus, conjunctiva pink, PERRLA and EOMs intact;  Cardiovascular: Normal rate and rhythm. S1,S2 noted.  No murmur, rubs or gallops noted. No JVD or BLE edema. No carotid bruits noted.  Pedal pulse 2+ on the left hand.  Cap refill <3 seconds on the left hand Pulmonary/Chest: Normal effort and positive vesicular breath sounds. No respiratory distress. No wheezes, rales or ronchi noted.  Abdomen: Normal bowel sounds.  Musculoskeletal: No difficulty with gait.  Neurological: Alert and oriented. Coordination normal.  Psychiatric: Mood and affect normal. Behavior is normal. Judgment and thought content normal.     BMET    Component Value Date/Time   NA 140 12/16/2022 1017   NA  139 11/15/2019 1010   NA 139 10/24/2013 1050   K 4.7 12/16/2022 1017   K 3.8 10/24/2013 1050   CL 106 12/16/2022 1017   CL 107 10/24/2013 1050   CO2 23 12/16/2022 1017   CO2 24 10/24/2013 1050   GLUCOSE 103 (H) 12/16/2022 1017   GLUCOSE 108 (H) 10/24/2013 1050   BUN 17 12/16/2022 1017   BUN 16 11/15/2019 1010   BUN 8 10/24/2013 1050   CREATININE 0.96 12/16/2022 1017   CALCIUM  10.1 12/16/2022 1017   CALCIUM  8.2 (L) 10/24/2013 1050   GFRNONAA 46 (L) 01/26/2022 2236   GFRNONAA >60 10/24/2013 1050   GFRAA 81 11/15/2019 1010   GFRAA >60 10/24/2013 1050    Lipid Panel     Component Value Date/Time   CHOL 113 12/16/2022 1017   CHOL 141 11/15/2019 1010   TRIG 103 12/16/2022 1017   HDL 37 (L) 12/16/2022 1017   HDL 32 (L) 11/15/2019 1010   CHOLHDL 3.1 12/16/2022 1017   VLDL 18.2 05/05/2020 1304   LDLCALC 57 12/16/2022 1017    CBC    Component Value Date/Time   WBC 15.4 (H) 12/16/2022 1017   RBC 5.96 (  H) 12/16/2022 1017   HGB 17.2 (H) 12/16/2022 1017   HGB 17.7 11/15/2019 1010   HCT 53.5 (H) 12/16/2022 1017   HCT 51.9 (H) 11/15/2019 1010   PLT 485 (H) 12/16/2022 1017   PLT 391 11/15/2019 1010   MCV 89.8 12/16/2022 1017   MCV 88 11/15/2019 1010   MCV 89 10/24/2013 1050   MCH 28.9 12/16/2022 1017   MCHC 32.1 12/16/2022 1017   RDW 13.1 12/16/2022 1017   RDW 13.0 11/15/2019 1010   RDW 14.5 10/24/2013 1050   LYMPHSABS 4.6 (H) 04/04/2015 1040   LYMPHSABS 2.8 11/07/2013 1509   MONOABS 0.9 04/04/2015 1040   EOSABS 0.1 04/04/2015 1040   EOSABS 0.2 11/07/2013 1509   BASOSABS 0.1 04/04/2015 1040   BASOSABS 0.1 11/07/2013 1509    Hgb A1C Lab Results  Component Value Date   HGBA1C 5.9 (H) 12/16/2022           Assessment & Plan:     RTC in 6 months for your annual exam Helayne Lo, NP

## 2023-06-15 NOTE — Assessment & Plan Note (Signed)
Continue metoprolol and aspirin He will continue to follow with cardiology

## 2023-06-15 NOTE — Patient Instructions (Signed)

## 2023-06-15 NOTE — Assessment & Plan Note (Signed)
C-Met and lipid profile today Encouraged him to consume a low-fat diet Continue rosuvastatin and fenofibrate 

## 2023-06-15 NOTE — Assessment & Plan Note (Signed)
 C-Met and lipid profile today Encouraged him to consume a low-fat diet Continue rosuvastatin , fenofibrate , effient 

## 2023-06-15 NOTE — Assessment & Plan Note (Signed)
 Encourage smoking cessation Continue on advair and albuterol 

## 2023-06-15 NOTE — Assessment & Plan Note (Signed)
 A1c and urine microalbumin today Encouraged him to consume a low-carb diet Not medicated Encouraged routine eye exam Encouraged routine foot exam Immunizations UTD

## 2023-06-15 NOTE — Assessment & Plan Note (Signed)
 Noncompliant with CPAP

## 2023-06-15 NOTE — Assessment & Plan Note (Signed)
Controlled on metoprolol Reinforced DASH diet and exercise for weight loss

## 2023-06-15 NOTE — Assessment & Plan Note (Signed)
 C-Met and lipid profile today Encouraged him to consume a low-fat diet Continue rosuvastatin , fenofibrate , metoprolol , effient 

## 2023-06-15 NOTE — Assessment & Plan Note (Signed)
 Continue sildenafil as needed.

## 2023-06-15 NOTE — Addendum Note (Signed)
 Addended by: Carollynn Cirri on: 06/15/2023 10:26 AM   Modules accepted: Level of Service

## 2023-06-15 NOTE — Assessment & Plan Note (Signed)
Compensated Continue metoprolol Encourage DASH diet Monitor daily weights

## 2023-06-16 ENCOUNTER — Encounter: Payer: Self-pay | Admitting: Internal Medicine

## 2023-06-16 LAB — COMPREHENSIVE METABOLIC PANEL WITH GFR
AG Ratio: 1.6 (calc) (ref 1.0–2.5)
ALT: 19 U/L (ref 9–46)
AST: 20 U/L (ref 10–35)
Albumin: 4.1 g/dL (ref 3.6–5.1)
Alkaline phosphatase (APISO): 85 U/L (ref 35–144)
BUN: 15 mg/dL (ref 7–25)
CO2: 24 mmol/L (ref 20–32)
Calcium: 9.9 mg/dL (ref 8.6–10.3)
Chloride: 106 mmol/L (ref 98–110)
Creat: 0.9 mg/dL (ref 0.70–1.35)
Globulin: 2.6 g/dL (ref 1.9–3.7)
Glucose, Bld: 109 mg/dL — ABNORMAL HIGH (ref 65–99)
Potassium: 4.5 mmol/L (ref 3.5–5.3)
Sodium: 139 mmol/L (ref 135–146)
Total Bilirubin: 0.4 mg/dL (ref 0.2–1.2)
Total Protein: 6.7 g/dL (ref 6.1–8.1)
eGFR: 96 mL/min/{1.73_m2} (ref >=60)

## 2023-06-16 LAB — HEMOGLOBIN A1C
Hgb A1c MFr Bld: 6.2 % — ABNORMAL HIGH (ref <5.7)
Mean Plasma Glucose: 131 mg/dL
eAG (mmol/L): 7.3 mmol/L

## 2023-06-16 LAB — CBC
HCT: 51.5 % — ABNORMAL HIGH (ref 38.5–50.0)
Hemoglobin: 16.8 g/dL (ref 13.2–17.1)
MCH: 29.5 pg (ref 27.0–33.0)
MCHC: 32.6 g/dL (ref 32.0–36.0)
MCV: 90.5 fL (ref 80.0–100.0)
MPV: 9.7 fL (ref 7.5–12.5)
Platelets: 391 10*3/uL (ref 140–400)
RBC: 5.69 10*6/uL (ref 4.20–5.80)
RDW: 13.8 % (ref 11.0–15.0)
WBC: 13.1 10*3/uL — ABNORMAL HIGH (ref 3.8–10.8)

## 2023-06-16 LAB — MICROALBUMIN / CREATININE URINE RATIO
Creatinine, Urine: 203 mg/dL (ref 20–320)
Microalb Creat Ratio: 8 mg/g{creat} (ref <30)
Microalb, Ur: 1.6 mg/dL

## 2023-06-16 LAB — LIPID PANEL
Cholesterol: 127 mg/dL (ref <200)
HDL: 42 mg/dL (ref >=40)
LDL Cholesterol (Calc): 62 mg/dL
Non-HDL Cholesterol (Calc): 85 mg/dL (ref <130)
Total CHOL/HDL Ratio: 3 (calc) (ref <5.0)
Triglycerides: 145 mg/dL (ref <150)

## 2023-07-10 ENCOUNTER — Other Ambulatory Visit: Payer: Self-pay | Admitting: Cardiology

## 2023-07-11 ENCOUNTER — Other Ambulatory Visit: Payer: Self-pay | Admitting: Internal Medicine

## 2023-07-11 ENCOUNTER — Other Ambulatory Visit: Payer: Self-pay

## 2023-07-11 DIAGNOSIS — I5042 Chronic combined systolic (congestive) and diastolic (congestive) heart failure: Secondary | ICD-10-CM

## 2023-07-11 MED ORDER — FLUTICASONE-SALMETEROL 100-50 MCG/ACT IN AEPB: INHALATION_SPRAY | RESPIRATORY_TRACT | 1 refills | Status: DC

## 2023-07-12 ENCOUNTER — Other Ambulatory Visit: Payer: Self-pay | Admitting: Medical Genetics

## 2023-07-12 NOTE — Telephone Encounter (Signed)
 Duplicate request, 07/11/23.  Requested Prescriptions  Pending Prescriptions Disp Refills   fluticasone -salmeterol (ADVAIR) 100-50 MCG/ACT AEPB [Pharmacy Med Name: FLUTICASONE -SALMETEROL 100-50] 180 each 1    Sig: INHALE 1 PUFF BY MOUTH 2 TIMES A DAY - IN THE MORNING AND IN THE EVENING     Pulmonology:  Combination Products Passed - 07/12/2023 11:19 AM      Passed - Valid encounter within last 12 months    Recent Outpatient Visits           3 weeks ago Type 2 diabetes mellitus with complication [facility])   [place] [facility] [place], Rankin Buzzard, [place]

## 2023-07-19 ENCOUNTER — Ambulatory Visit (INDEPENDENT_AMBULATORY_CARE_PROVIDER_SITE_OTHER): Admitting: Internal Medicine

## 2023-07-19 ENCOUNTER — Encounter: Payer: Self-pay | Admitting: Internal Medicine

## 2023-07-19 VITALS — BP 110/68 | HR 86 | Ht 71.0 in | Wt 170.8 lb

## 2023-07-19 DIAGNOSIS — J432 Centrilobular emphysema: Secondary | ICD-10-CM | POA: Diagnosis not present

## 2023-07-19 DIAGNOSIS — H00012 Hordeolum externum right lower eyelid: Secondary | ICD-10-CM | POA: Diagnosis not present

## 2023-07-19 MED ORDER — ERYTHROMYCIN 5 MG/GM OP OINT: 1.0000 | TOPICAL_OINTMENT | Freq: Every day | OPHTHALMIC | 0 refills | Status: DC

## 2023-07-19 NOTE — Patient Instructions (Signed)
 Stye A stye, also known as a hordeolum, is a bump that forms on an eyelid. It may look like a pimple next to the eyelash. A stye can form inside the eyelid (internal stye) or outside the eyelid (external stye). A stye can cause redness, swelling, and pain on the eyelid. Styes are very common. Anyone can get them at any age. They usually occur in just one eye at a time, but you may have more than one in either eye. What are the causes? A stye is caused by an infection. The infection is almost always caused by bacteria called Staphylococcus aureus. This is a common type of bacteria that lives on the skin. An internal stye may result from an infected oil-producing gland inside the eyelid. An external stye may be caused by an infection at the base of the eyelash (hair follicle). What increases the risk? You are more likely to develop a stye if: You have had a stye before. You have any of these conditions: Red, itchy, inflamed eyelids (blepharitis). A skin condition such as seborrheic dermatitis or rosacea. High fat levels in your blood (lipids). Dry eyes. What are the signs or symptoms? The most common symptom of a stye is eyelid pain. Internal styes are more painful than external styes. Other symptoms may include: Painful swelling of your eyelid. A scratchy feeling in your eye. Tearing and redness of your eye. A pimple-like bump on the edge of the eyelid. Pus draining from the stye. How is this diagnosed? Your health care provider may be able to diagnose a stye just by examining your eye. The health care provider may also check to make sure: You do not have a fever or other signs of a more serious infection. The infection has not spread to other parts of your eye or areas around your eye. How is this treated? Most styes will clear up in a few days without treatment or with warm compresses applied to the area. You may need to use antibiotic drops or ointment to treat an infection. Sometimes,  steroid drops or ointment are used in addition to antibiotics. In some cases, your health care provider may give you a small steroid injection in the eyelid. If your stye does not heal with routine treatment, your health care provider may drain pus from the stye using a thin blade or needle. This may be done if the stye is large, causing a lot of pain, or affecting your vision. Follow these instructions at home: Take over-the-counter and prescription medicines only as told by your health care provider. This includes eye drops or ointments. If you were prescribed an antibiotic medicine, steroid medicine, or both, apply or use them as told by your health care provider. Do not stop using the medicine even if your condition improves. Apply a warm, wet cloth (warm compress) to your eye for 5-10 minutes, 4 to 6 times a day. Clean the affected eyelid as directed by your health care provider. Do not wear contact lenses or eye makeup until your stye has healed and your health care provider says that it is safe. Do not try to pop or drain the stye. Do not rub your eye. Contact a health care provider if: You have chills or a fever. Your stye does not go away after several days. Your stye affects your vision. Your eyeball becomes swollen, red, or painful. Get help right away if: You have pain when moving your eye around. Summary A stye is a bump that forms  on an eyelid. It may look like a pimple next to the eyelash. A stye can form inside the eyelid (internal stye) or outside the eyelid (external stye). A stye can cause redness, swelling, and pain on the eyelid. Your health care provider may be able to diagnose a stye just by examining your eye. Apply a warm, wet cloth (warm compress) to your eye for 5-10 minutes, 4 to 6 times a day. This information is not intended to replace advice given to you by your health care provider. Make sure you discuss any questions you have with your health care  provider. Document Revised: 04/02/2020 Document Reviewed: 04/02/2020 Elsevier Patient Education  2024 [employer].

## 2023-07-19 NOTE — Progress Notes (Signed)
 Subjective:    Patient ID: Troy Reeve., male    DOB: April 03, 1960, 63 y.o.   MRN: [id number]  HPI  Discussed the use of AI scribe software for clinical note transcription with the patient, who gave verbal consent to proceed.   Troy Vanessen. is a 63 year old male with COPD who presents with a recent history of cough and congestion.  He has been experiencing cough and nasal congestion for approximately two months, starting around Easter. These symptoms persisted longer than usual, prompting him to schedule an appointment. However, the symptoms resolved a couple of days ago, just after the visit was scheduled.  He has a history of COPD and continues to smoke. He uses Advair and albuterol  inhalers for management. His breathing issues, including coughing, are more bothersome in the evenings and with certain aromas.  He developed a stye on his lower eyelid two days ago. Initially, it was protruding, but it has improved slightly. He has been trying to avoid rubbing it despite the itching sensation. He mentions having a cat that recently had surgery and has been very clingy, which he speculates might be related to the stye's development.        Review of Systems  Past Medical History:  Diagnosis Date   Bronchitis    Gets bronchitis almost every year   CAD S/P percutaneous coronary angioplasty 06/2011; 6/ & 11/2013   S/P PCI to all 3 major vessels;a)  Ant STEMI 2001- BMS-LAD x2 -->(redo PCI 6/'15 -- pLAD Xience DES 2.5 x 12- 2.75 mm, mLAD 2.25 x 12 - 2.7 mm), b) '06 UA --> Cx- OM DES; c) 2013 Inf MI BMS mRCA --> d) 10/'15 PCI dRCA Promus P DES 4.0 x 16 (4.25 mm); PTCA of RPL2 (2.0 mm) &RPDA (2.25 mm) 11/2013; 4/18 PCI OM1 Synergy DES  2.5 x 16   COPD (chronic obstructive pulmonary disease) (HCC)    Elevated WBC count    Emphysema of lung (HCC)    Essential hypertension 07/07/2011   Former heavy tobacco smoker     quit in May 2015 after multiple attempts at trying to quit before     GERD (gastroesophageal reflux disease)    Glucose intolerance (pre-diabetes) June 2015    hemoglobin A1c 6.6   Headache    "Imdur  related; stopped taking it; headaches went away" (11/15/2013)   Heart murmur    History of colon polyps    History of stomach ulcers    Hyperlipidemia with target LDL less than 70 07/07/2011   Metabolic syndrome    Pre-diabetes, hypertension and truncal obesity as well as dyslipidemia   OSA on CPAP    Pneumonia "several times"   Recurrent upper respiratory infection (URI)    Seizures (HCC) "several"   "last one was ~ 2011" (11/15/2013)   Shortness of breath dyspnea    ST elevation myocardial infarction (STEMI) of anterior wall (HCC) 2001   History of -- 2 stents in early and distal mid LAD; prior cardiologist was Dr. Lavone Power in [place]   ST elevation myocardial infarction (STEMI) of inferior wall (HCC) 07/07/2011   Occluded RCA 2.75X18 INTEGRITY; Echo 07/2013: EF 45-50%, Inferior & Posterolateral HK.    Umbilical hernia    Unstable angina (HCC) 2006; 07/2013   Cx-OM - PCI 2.5 mm 13 mm Cypher DES Jereld Monas, [place]) ; 07/2013: Severe ISR of both prox & Distal LAD stentS --> 2 DES stents (1 at prox edge of the proximal stent, 2nd  covers the entire distal stent as well as proximal and distal edge stenose)    Upper respiratory infection     Current Outpatient Medications  Medication Sig Dispense Refill   albuterol  (VENTOLIN  HFA) 108 (90 Base) MCG/ACT inhaler INHALE 2 PUFFS BY MOUTH EVERY 6 HOURS AS NEEDED FOR SHORTNESS OF BREATH 25.5 g 1   Blood Glucose Monitoring Suppl DEVI 1 each by Does not apply route in the morning, at noon, and at bedtime. May substitute to any manufacturer covered by patient's insurance. 1 each 0   Cholecalciferol 50 MCG (2000 UT) CAPS Take 2,000 Units by mouth.     clobetasol  cream (TEMOVATE ) 0.05 % Apply 1 Application topically daily as needed (eczema). 30 g 0   fenofibrate  (TRICOR ) 48 MG tablet TAKE 1 TABLET BY MOUTH DAILY 90 tablet  2   fluticasone  (FLONASE ) 50 MCG/ACT nasal spray Place 2 sprays into both nostrils daily. 16 g 0   fluticasone -salmeterol (ADVAIR) 100-50 MCG/ACT AEPB INHALE 1 PUFF BY MOUTH 2 TIMES A DAY - IN THE MORNING AND IN THE EVENING 180 each 1   gabapentin (NEURONTIN) 100 MG capsule Take 100 mg by mouth at bedtime.     ketoconazole  (NIZORAL ) 2 % shampoo Apply 1 Application topically 2 (two) times a week. 120 mL 0   Lancets (ONETOUCH DELICA PLUS LANCET33G) MISC USE TO TEST BLOOD SUGAR UP TO 4 TIMES DAILY 100 each 3   metoprolol  succinate (TOPROL -XL) 25 MG 24 hr tablet TAKE 1 AND 1/2 TABLET BY MOUTH EVERY EVENING 135 tablet 3   nitroGLYCERIN  (NITROSTAT ) 0.4 MG SL tablet Place 1 tablet (0.4 mg total) under the tongue every 5 (five) minutes as needed for chest pain. (Patient not taking: Reported on 06/15/2023) 25 tablet 11   Omega-3 Fatty Acids (FISH OIL ) 1000 MG CPDR Take 3 g by mouth daily. 30 capsule 11   omeprazole  (PRILOSEC) 40 MG capsule TAKE 1 CAPSULE BY MOUTH DAILY 90 capsule 1   ONETOUCH ULTRA test strip USE 1 STRIP TO TEST 4 TIMES A DAY 200 strip 1   prasugrel  (EFFIENT ) 10 MG TABS tablet Take 1 tablet (10 mg total) by mouth daily. 90 tablet 3   rosuvastatin  (CRESTOR ) 40 MG tablet Take 1 tablet (40 mg total) by mouth daily. 90 tablet 3   sildenafil  (VIAGRA ) 50 MG tablet TAKE 1/2 TO 1 TABLET BY MOUTH AS NEEDED FOR ERECTILE DYSFUNCTION 10 tablet 6   No current facility-administered medications for this visit.    Allergies  Allergen Reactions   Niacin  And Related Hives and Itching    Family History  Problem Relation Age of Onset   Coronary artery disease Father    Heart disease Father    Stroke Father    Hypertension Father    Hyperlipidemia Mother    Diabetes Mother    Hyperlipidemia Maternal Grandmother    Diabetes Maternal Grandmother    Hyperlipidemia Maternal Grandfather    Diabetes Maternal Grandfather    Emphysema Maternal Grandfather    Heart disease Paternal Grandmother    Heart  disease Paternal Grandfather    Cancer Maternal Aunt        breast    Social History   Socioeconomic History   Marital status: Married    Spouse name: Not on file   Number of children: 8   Years of education: Not on file   Highest education level: Never attended school  Occupational History   Not on file  Tobacco Use   Smoking status: Every  Day    Current packs/day: 2.25    Average packs/day: 2.3 packs/day for 48.4 years (109.0 ttl pk-yrs)    Types: Cigarettes    Start date: 30   Smokeless tobacco: Never   Tobacco comments:    Quit with Bupropion  150mg  - > unfortunately, he restarted; has cut all the way down to maybe 1 or 2 cigarettes a day, some days he goes without any.-Some days even multiple days without cigarettes.    12/31/2022 patient smokes about 14 cigarettes daily  Vaping Use   Vaping status: Never Used  Substance and Sexual Activity   Alcohol use: Yes    Comment: rare   Drug use: No   Sexual activity: Not on file  Other Topics Concern   Not on file  Social History Narrative   He is a married father of 8, grandfather of 8. He does not really get routine exercise.    He is down to 1-2 cigarettes a day-sometimes going more than 2 to 3 days without 1.. He is very seriously wanting to quit.     He has a social alcoholic beverage every now and then.       He is no longer working, medically retired essentially.  His wife still works.  He and his wife enjoy traveling have done multiple trips and vacations over the last couple years including several trips to [place]  to be near her family.      Late summer 2023-2-week trip to [place]  [facility], and then drove up to the [employer] including seeing [place] and [place].  Lots of walking and hiking.   Social Drivers of Corporate investment banker Strain: Low Risk  (06/15/2023)   Overall Financial Resource Strain (CARDIA)    Difficulty of Paying Living Expenses: Not hard at all  Food Insecurity:  No Food Insecurity (06/15/2023)   Hunger Vital Sign    Worried About Running Out of Food in the Last Year: Never true    Ran Out of Food in the Last Year: Never true  Transportation Needs: No Transportation Needs (06/15/2023)   PRAPARE - Administrator, Civil Service (Medical): No    Lack of Transportation (Non-Medical): No  Physical Activity: Insufficiently Active (06/15/2023)   Exercise Vital Sign    Days of Exercise per Week: 1 day    Minutes of Exercise per Session: 10 min  Stress: No Stress Concern Present (06/15/2023)   [employer] of Occupational Health - Occupational Stress Questionnaire    Feeling of Stress : Not at all  Social Connections: Moderately Integrated (06/15/2023)   Social Connection and Isolation Panel [NHANES]    Frequency of Communication with Friends and Family: More than three times a week    Frequency of Social Gatherings with Friends and Family: More than three times a week    Attends Religious Services: 1 to 4 times per year    Active Member of [employer] or Organizations: No    Attends Banker Meetings: Never    Marital Status: Married  Catering manager Violence: Not At Risk (10/15/2022)   Humiliation, Afraid, Rape, and Kick questionnaire    Fear of Current or Ex-Partner: No    Emotionally Abused: No    Physically Abused: No    Sexually Abused: No     Constitutional: Denies fever, malaise, fatigue, headache or abrupt weight changes.  HEENT: Pt reports stye to right lower eyelid. Denies eye pain, eye redness, ear pain, ringing in  the ears, wax buildup, runny nose, nasal congestion, bloody nose, or sore throat. Respiratory: Denies difficulty breathing, shortness of breath, cough or sputum production.   Cardiovascular: Denies chest pain, chest tightness, palpitations or swelling in the hands or feet.   No other specific complaints in a complete review of systems (except as listed in HPI above).     Objective:   Physical Exam  BP 110/68  (BP Location: Right Arm, Patient Position: Sitting, Cuff Size: Normal)   Pulse 86   Ht 5\' 11"  (1.803 m)   Wt 170 lb 12.8 oz (77.5 kg)   SpO2 99%   BMI 23.82 kg/m     Wt Readings from Last 3 Encounters:  06/15/23 167 lb 6.4 oz (75.9 kg)  12/31/22 166 lb 3.2 oz (75.4 kg)  12/16/22 163 lb (73.9 kg)    General: Appears his stated age, well developed, well nourished in NAD. HEENT: Head: normal shape and size; Eyes: sclera white, no icterus, conjunctiva pink, PERRLA and EOMs intact, stye noted of right lower inner eyelid;  Neck: No adenopathy noted. Cardiovascular: Normal rate and rhythm. S1,S2 noted.  No murmur, rubs or gallops noted. No JVD or BLE edema. No carotid bruits noted.  Pedal pulse 2+ on the left hand.  Cap refill <3 seconds on the left hand Pulmonary/Chest: Normal effort and positive vesicular breath sounds. No respiratory distress. No wheezes, rales or ronchi noted.  Neurological: Alert and oriented.   BMET    Component Value Date/Time   NA 139 06/15/2023 0959   NA 139 11/15/2019 1010   NA 139 10/24/2013 1050   K 4.5 06/15/2023 0959   K 3.8 10/24/2013 1050   CL 106 06/15/2023 0959   CL 107 10/24/2013 1050   CO2 24 06/15/2023 0959   CO2 24 10/24/2013 1050   GLUCOSE 109 (H) 06/15/2023 0959   GLUCOSE 108 (H) 10/24/2013 1050   BUN 15 06/15/2023 0959   BUN 16 11/15/2019 1010   BUN 8 10/24/2013 1050   CREATININE 0.90 06/15/2023 0959   CALCIUM  9.9 06/15/2023 0959   CALCIUM  8.2 (L) 10/24/2013 1050   GFRNONAA 46 (L) 01/26/2022 2236   GFRNONAA >60 10/24/2013 1050   GFRAA 81 11/15/2019 1010   GFRAA >60 10/24/2013 1050    Lipid Panel     Component Value Date/Time   CHOL 127 06/15/2023 0959   CHOL 141 11/15/2019 1010   TRIG 145 06/15/2023 0959   HDL 42 06/15/2023 0959   HDL 32 (L) 11/15/2019 1010   CHOLHDL 3.0 06/15/2023 0959   VLDL 18.2 05/05/2020 1304   LDLCALC 62 06/15/2023 0959    CBC    Component Value Date/Time   WBC 13.1 (H) 06/15/2023 0959   RBC  5.69 06/15/2023 0959   HGB 16.8 06/15/2023 0959   HGB 17.7 11/15/2019 1010   HCT 51.5 (H) 06/15/2023 0959   HCT 51.9 (H) 11/15/2019 1010   PLT 391 06/15/2023 0959   PLT 391 11/15/2019 1010   MCV 90.5 06/15/2023 0959   MCV 88 11/15/2019 1010   MCV 89 10/24/2013 1050   MCH 29.5 06/15/2023 0959   MCHC 32.6 06/15/2023 0959   RDW 13.8 06/15/2023 0959   RDW 13.0 11/15/2019 1010   RDW 14.5 10/24/2013 1050   LYMPHSABS 4.6 (H) 04/04/2015 1040   LYMPHSABS 2.8 11/07/2013 1509   MONOABS 0.9 04/04/2015 1040   EOSABS 0.1 04/04/2015 1040   EOSABS 0.2 11/07/2013 1509   BASOSABS 0.1 04/04/2015 1040   BASOSABS 0.1 11/07/2013 1509  Hgb A1C Lab Results  Component Value Date   HGBA1C 6.2 (H) 06/15/2023           Assessment & Plan:   Assessment and Plan    Stye Acute stye on lower eyelid, likely non-bacterial. Erythromycin ointment prescribed for symptomatic relief and to prevent bacterial infection. - Prescribe erythromycin 5 mg ointment to apply in the lower conjunctival sac at bedtime for up to seven days. - Advise warm compresses and gentle massage to the affected area. - Instruct to seek ophthalmology consultation if the stye enlarges or swells significantly.  Chronic Obstructive Pulmonary Disease (COPD) COPD with continued smoking. Symptoms more bothersome in the evenings and triggered by certain aromas. Smoking cessation advised to prevent further exacerbation. - Continue current use of Advair and albuterol  inhalers. - Advise smoking cessation to prevent further exacerbation.        RTC in 5 months for your annual exam Helayne Lo, NP

## 2023-08-22 ENCOUNTER — Telehealth: Payer: Self-pay | Admitting: Cardiology

## 2023-08-22 ENCOUNTER — Other Ambulatory Visit (HOSPITAL_COMMUNITY): Payer: Self-pay

## 2023-08-22 MED ORDER — EFFIENT 10 MG PO TABS
10.0000 mg | ORAL_TABLET | Freq: Every day | ORAL | 1 refills | Status: DC
  Filled 2023-08-22: qty 90, 90d supply, fill #0
  Filled 2023-11-18: qty 90, 90d supply, fill #1

## 2023-08-22 NOTE — Telephone Encounter (Signed)
*  STAT* If patient is at the pharmacy, call can be transferred to refill team.   1. Which medications need to be refilled? (please list name of each medication and dose if known)   prasugrel  (EFFIENT ) 10 MG TABS tablet    2. Which pharmacy/location (including street and city if local pharmacy) is medication to be sent to? [facility] Phone: [phone]  Fax: [phone]     3. Do they need a 30 day or 90 day supply? 90 Per pt Dr Anner said he can ONLY take the name brand no Generic

## 2023-08-22 NOTE — Telephone Encounter (Signed)
 Pt is out of medication

## 2023-08-22 NOTE — Telephone Encounter (Signed)
 Pt's medication was sent to pt's pharmacy as requested. Confirmation received.

## 2023-08-23 ENCOUNTER — Other Ambulatory Visit: Payer: Self-pay

## 2023-08-23 ENCOUNTER — Other Ambulatory Visit (HOSPITAL_COMMUNITY): Payer: Self-pay

## 2023-08-30 DIAGNOSIS — K644 Residual hemorrhoidal skin tags: Secondary | ICD-10-CM | POA: Diagnosis not present

## 2023-08-30 DIAGNOSIS — L290 Pruritus ani: Secondary | ICD-10-CM | POA: Diagnosis not present

## 2023-08-31 ENCOUNTER — Encounter: Payer: Self-pay | Admitting: Acute Care

## 2023-09-12 DIAGNOSIS — D2271 Melanocytic nevi of right lower limb, including hip: Secondary | ICD-10-CM | POA: Diagnosis not present

## 2023-09-12 DIAGNOSIS — D2261 Melanocytic nevi of right upper limb, including shoulder: Secondary | ICD-10-CM | POA: Diagnosis not present

## 2023-09-12 DIAGNOSIS — D225 Melanocytic nevi of trunk: Secondary | ICD-10-CM | POA: Diagnosis not present

## 2023-09-12 DIAGNOSIS — L821 Other seborrheic keratosis: Secondary | ICD-10-CM | POA: Diagnosis not present

## 2023-09-12 DIAGNOSIS — D2262 Melanocytic nevi of left upper limb, including shoulder: Secondary | ICD-10-CM | POA: Diagnosis not present

## 2023-09-12 DIAGNOSIS — D2272 Melanocytic nevi of left lower limb, including hip: Secondary | ICD-10-CM | POA: Diagnosis not present

## 2023-09-20 DIAGNOSIS — H2511 Age-related nuclear cataract, right eye: Secondary | ICD-10-CM | POA: Diagnosis not present

## 2023-09-20 DIAGNOSIS — H25041 Posterior subcapsular polar age-related cataract, right eye: Secondary | ICD-10-CM | POA: Diagnosis not present

## 2023-09-20 DIAGNOSIS — E119 Type 2 diabetes mellitus without complications: Secondary | ICD-10-CM | POA: Diagnosis not present

## 2023-09-20 DIAGNOSIS — H25011 Cortical age-related cataract, right eye: Secondary | ICD-10-CM | POA: Diagnosis not present

## 2023-09-20 LAB — HM DIABETES EYE EXAM

## 2023-10-05 ENCOUNTER — Encounter: Payer: Self-pay | Admitting: Cardiology

## 2023-10-05 ENCOUNTER — Ambulatory Visit: Attending: Cardiology | Admitting: Cardiology

## 2023-10-05 VITALS — BP 138/78 | HR 95 | Ht 71.0 in | Wt 174.0 lb

## 2023-10-05 DIAGNOSIS — E1169 Type 2 diabetes mellitus with other specified complication: Secondary | ICD-10-CM | POA: Diagnosis not present

## 2023-10-05 DIAGNOSIS — I10 Essential (primary) hypertension: Secondary | ICD-10-CM | POA: Diagnosis not present

## 2023-10-05 DIAGNOSIS — I25119 Atherosclerotic heart disease of native coronary artery with unspecified angina pectoris: Secondary | ICD-10-CM | POA: Diagnosis not present

## 2023-10-05 DIAGNOSIS — J432 Centrilobular emphysema: Secondary | ICD-10-CM | POA: Diagnosis not present

## 2023-10-05 DIAGNOSIS — G4733 Obstructive sleep apnea (adult) (pediatric): Secondary | ICD-10-CM

## 2023-10-05 DIAGNOSIS — F17200 Nicotine dependence, unspecified, uncomplicated: Secondary | ICD-10-CM | POA: Diagnosis not present

## 2023-10-05 DIAGNOSIS — I5032 Chronic diastolic (congestive) heart failure: Secondary | ICD-10-CM

## 2023-10-05 DIAGNOSIS — E785 Hyperlipidemia, unspecified: Secondary | ICD-10-CM | POA: Diagnosis not present

## 2023-10-05 DIAGNOSIS — I252 Old myocardial infarction: Secondary | ICD-10-CM | POA: Diagnosis not present

## 2023-10-05 DIAGNOSIS — Z716 Tobacco abuse counseling: Secondary | ICD-10-CM

## 2023-10-05 NOTE — Progress Notes (Signed)
 Cardiology Office Note:  .   Date:  10/10/2023  ID:  Troy Parrish., DOB [birth year], MRN [id number] PCP: Antonette Angeline ORN, NP  [facility] Providers Cardiologist:  Alm Clay, MD     Chief Complaint  Patient presents with   Follow-up    Standard 1-month follow-up.  Doing well.  No major issues.   Coronary Artery Disease    No recurrent angina.  No melena, hematochezia, hematuria on Effient     Patient Profile: .     Troy Parrish. is a 63 y.o. male with a PMH noted below who presents here for routine f/u at the request of Antonette Angeline ORN, NP.  Troy Parrish. is a 63 y.o. male recurrent smoker with a PMH reviewed below who presents here for delayed 62-month follow-up at the request of Antonette Angeline ORN, NP. Pertinent PMH: Longstanding MV CAD-PCI (has had a STEMI of all 3 major arteries),  Last PCI was in 2018 Last cath 2020-patent stents Is on maintenance Effient  because of Plavix nonresponse and intolerance of Brilinta. ICM with EF roughly 45%-NYHA Class I CHF,  HLD on multiple medications -> most recent TC 114, LDL 53, TG 154, HDL 37 HTN  DM-2-diet controlled.  A1c 5.87 08/2021 Long-term smoker-now fully quit after several prolonged attempts. Mild associated COPD Erectile dysfunction-PRN Viagra      Troy Parrish. was last seen on December 31, 2022-doing well.  Unfortunately was up to 1 pack a day smoking but otherwise was doing well occasional exertional dyspnea and occasional sharp chest discomfort that is not exertional.  Still traveling and doing well.  NYHA Class I-II CHF symptoms with no active angina.  A1c was 5.9 LDL was 57  Subjective  Discussed the use of AI scribe software for clinical note transcription with the patient, who gave verbal consent to proceed.  History of Present Illness Troy Parrish. is a 63 year old male with hypertension and coronary artery disease (multiple MIs involving all major coronaries and PCI of all major coronaries) who  presents for a routine follow-up.  He is in good spirits, doing well with no major issues.  Occasional shortness of breath, attributed to smoking, but not more frequent than usual.  No resting or exertional chest pain, heart palpitations, PND, orthopnea, or edema. Has not needed nitroglycerin  recently.   No dizziness, wooziness, or syncope/near syncope, except when coughing heavily, described as a reflex reaction.  No TIA CVA or amaurosis fugax.  Symptoms.  Blood pressure has been stable without recent spikes. Currently taking metoprolol  25 mg once daily.  No palpitation issues.  On Effient  10 mg daily (on maintenance therapy due to Eliquis tolerance and recurrent MI despite Plavix).  No bleeding issues.    On rosuvastatin  40 mg and Tricor  48 mg. Takes gabapentin for neuropathy pain and omega-3 fatty acids.  Previously attempted to quit smoking using bupropion , which caused severe headaches. Had a successful period of smoking cessation through hypnotism but relapsed due to exposure to smoking at home.  He and his wife are still traveling to the end to cancel one of the trips because of weather.  They are now ready go back out to [facility].    Objective   Current Meds  Medication Sig   EFFIENT  10 MG TABS tablet Take 1 tablet (10 mg total) by mouth daily.   fenofibrate  (TRICOR ) 48 MG tablet TAKE 1 TABLET BY MOUTH DAILY   metoprolol  succinate (TOPROL -XL) 25  MG 24 hr tablet TAKE 1 TABLET BY MOUTH EVERY EVENING   nitroGLYCERIN  (NITROSTAT ) 0.4 MG SL tablet Place 1 tablet (0.4 mg total) under the tongue every 5 (five) minutes as needed for chest pain.   Omega-3 Fatty Acids (FISH OIL ) 1000 MG CPDR Take 3 g by mouth daily.   omeprazole  (PRILOSEC) 40 MG capsule TAKE 1 CAPSULE BY MOUTH DAILY   rosuvastatin  (CRESTOR ) 40 MG tablet Take 1 tablet (40 mg total) by mouth daily.   sildenafil  (VIAGRA ) 50 MG tablet TAKE 1/2 TO 1 TABLET BY MOUTH AS NEEDED FOR ERECTILE DYSFUNCTION   Current Meds  Medication  Sig   albuterol  (VENTOLIN  HFA) 108 (90 Base) MCG/ACT inhaler INHALE 2 PUFFS BY MOUTH EVERY 6 HOURS AS NEEDED FOR SHORTNESS OF BREATH   Cholecalciferol 50 MCG (2000 UT) CAPS Take 2,000 Units by mouth.   clobetasol  cream (TEMOVATE ) 0.05 % Apply 1 Application topically daily as needed (eczema).   erythromycin  ophthalmic ointment Place 1 Application into the right eye at bedtime.   fluticasone  (FLONASE ) 50 MCG/ACT nasal spray Place 2 sprays into both nostrils daily.   fluticasone -salmeterol (ADVAIR) 100-50 MCG/ACT AEPB INHALE 1 PUFF BY MOUTH 2 TIMES A DAY - IN THE MORNING AND IN THE EVENING   gabapentin (NEURONTIN) 100 MG capsule Take 100 mg by mouth at bedtime.   ketoconazole  (NIZORAL ) 2 % shampoo Apply 1 Application topically 2 (two) times a week.   nitroGLYCERIN  (NITROSTAT ) 0.4 MG SL tablet Place 1 tablet (0.4 mg total) under the tongue every 5 (five) minutes as needed for chest pain.   omeprazole  (PRILOSEC) 40 MG capsule TAKE 1 CAPSULE BY MOUTH DAILY   Social History - Tobacco: Current smoker. Patient mentions smoking and its impact on breathing.  Unfortunately, after unsuccessful attempts at quitting, he still smokes and is working hard on trying to quit.  He just has spells where he lapses back into smoking. - Patient enjoys traveling and sightseeing. Has family connections in various locations and discusses family gatherings and events. Patient's child is in [employer] and stationed in [place] .  He is having his son who is doing his undergraduate at [employer] here in [place] surprised them dropped out prior to completing school to join [employer] citing better training opportunities to complete his career choices. Their middle son graduated from [employer] and is now living at home with them and working.  Studies Reviewed: SABRA   EKG Interpretation Date/Time:  Wednesday October 05 2023 14:33:33 EDT Ventricular Rate:  95 PR Interval:  138 QRS Duration:  94 QT Interval:  356 QTC  Calculation: 447 R Axis:   40  Text Interpretation: Normal sinus rhythm Normal ECG When compared with ECG of 31-Dec-2022 16:02, No significant change was found Confirmed by Anner Lenis ([id number]) on 10/10/2023 4:37:28 PM    Lab Results  Component Value Date   NA 139 06/15/2023   K 4.5 06/15/2023   CREATININE 0.90 06/15/2023   EGFR 96 06/15/2023   GLUCOSE 109 (H) 06/15/2023   Lab Results  Component Value Date   CHOL 127 06/15/2023   HDL 42 06/15/2023   LDLCALC 62 06/15/2023   LDLDIRECT 53.0 07/05/2018   TRIG 145 06/15/2023   CHOLHDL 3.0 06/15/2023   Lab Results  Component Value Date   HGBA1C 6.2 (H) 06/15/2023    ECHO 03/2015:  LVEF 45-50%. Inferior posterior hypokinesis with mild dilation. Apparent normal diastolic pressures; 05/2016; OM1 PCI: SYNERGY DES 2.5X16 -overlaps OLD stent. Cath February 13, 2018  similar findings when compared to prior cath.  50% proximal LAD with widely patent stent proximal to mid and mid to distal vessel 20% LCx at the ostium of OM has a patent stent.  20% ISR in proximal RCA stent.  Widely patent distal RCA stent with patent PTCA sites in PDA and PL 2.    Risk Assessment/Calculations:          Physical Exam:   VS:  BP 138/78   Pulse 95   Ht 5' 11 (1.803 m)   Wt 174 lb (78.9 kg)   SpO2 96%   BMI 24.27 kg/m    Wt Readings from Last 3 Encounters:  10/05/23 174 lb (78.9 kg)  07/19/23 170 lb 12.8 oz (77.5 kg)  06/15/23 167 lb 6.4 oz (75.9 kg)     GEN: Well nourished, well groomed in no acute distress; healthy-appearing.  In good spirits. NECK: No JVD; No carotid bruits CARDIAC: Normal S1, S2; RRR, no murmurs, rubs, gallops RESPIRATORY:  Clear to auscultation without rales, wheezing or rhonchi ; nonlabored, good air movement. ABDOMEN: Soft, non-tender, non-distended EXTREMITIES:  No edema; No deformity      ASSESSMENT AND PLAN: .    Problem List Items Addressed This Visit       Cardiology Problems   Chronic diastolic congestive heart  failure, NYHA class 1 (HCC) (Chronic)   NYHA Class I symptoms.  Euvolemic on exam.  No diuretic requirement. BP has been somewhat labile, low threshold to consider ARB for afterload reduction but otherwise continue Toprol  25 mg daily.      Coronary artery disease involving native coronary artery of native heart with angina pectoris (HCC) - Primary (Chronic)   Coronary artery disease, multiple STEMI's coronary trees; status/post-multivessel PCI, on standing maintenance Thienopyridine antiplatelet therapy Well-managed post-PCI status with no symptoms. Continues on prasugrel /Effient  10 mg daily - Continue low-dose Toprol  25 mg daily along with combination of fenofibrate  48 mg daily, rosuvastatin  40 mg daily, and omega-3 fatty acids - Continue Effient  (prasugrel ) 10 mg daily for antiplatelet therapy. - Okay to hold Effient  7 to 9 days preop for surgeries or procedures. - Ensure prescription is filled with generic Effient  as per insurance requirements.      Relevant Orders   EKG 12-Lead (Completed)   Essential hypertension (Chronic)   BP has been pretty much well-controlled on Toprol .  Somewhat elevated today likely due to rapidly walking into the clinic today. - Continue metoprolol  for blood pressure management. - Low threshold to consider reinitiating ARB for afterload reduction if BP continues to be elevated...      Relevant Orders   EKG 12-Lead (Completed)   Hyperlipidemia associated with type 2 diabetes mellitus (HCC) (Chronic)   Managed with rosuvastatin  40 mg and fenofibrate  48 mg daily along with fish oil  3 g daily - Continue current cholesterol management.  A1c 6.2.  Not currently on any medications.  Monitor closely      Relevant Orders   EKG 12-Lead (Completed)     Other   COPD (chronic obstructive pulmonary disease) (HCC) (Chronic)   Continue to encourage smoking cessation.  Continue Advair and albuterol .      Encounter for smoking cessation counseling (Chronic)    Continues to smoke. Previous cessation attempts with bupropion  caused headaches. Hypnotism was effective but not sustained. - Encourage reduction of smoking, especially during trips.  Occasional dyspnea attributed to smoking. No recent exacerbations. - Continue use of inhaler as needed for COPD symptoms.  Smoking cessation instruction/counseling given:  counseled patient on the dangers of tobacco use, advised patient to stop smoking, and reviewed strategies to maximize success Has tried Chantix , but not successful, also tried BuSpar.  He has done well initially but has always relapsed.  Needs something for maintenance.  4 minutes      History of ST elevation myocardial infarction (STEMI) (Chronic)   He has had ST elevations involving the RCA, LAD and LCx-OM 1.  Despite this, EF is relatively well-preserved at 45 to 50% with inferior posterior hypokinesis.  I suspect the inferior posterior wall was affected by 2 infarct to the RCA, the LCx infarct as well as apical LAD. No arrhythmias.  No angina or heart failure. Continue stable regimen.      OSA (obstructive sleep apnea) (Chronic)   He does not routinely use CPAP.  Defer to PCP.           Follow-Up: Return in about 6 months (around 04/06/2024) for 6 month follow-up with me, [employer].  I spent 45 minutes in the care of Troy Parrish. today including reviewing labs (1 minute), reviewing studies (most recent cardiac cath report/images and echo heart reviewed-5 minutes), face to face time discussing treatment options (23 minutes), reviewing records from previous clinic notes (3 minutes), 13 minutes dictating, and documenting in the encounter.      Signed, Alm MICAEL Clay, MD, MS Alm Clay, M.D., M.S. Interventional Chartered certified accountant  Pager # [phone]

## 2023-10-05 NOTE — Patient Instructions (Signed)
Medication Instructions:   No changes *If you need a refill on your cardiac medications before your next appointment, please call your pharmacy*   Lab Work:  Not needed   Testing/Procedures: Not needed   Follow-Up: At [facility], you and your health needs are our priority.  As part of our continuing mission to provide you with exceptional heart care, we have created designated Provider Care Teams.  These Care Teams include your primary Cardiologist (physician) and Advanced Practice Providers (APPs -  Physician Assistants and Nurse Practitioners) who all work together to provide you with the care you need, when you need it.     Your next appointment:   6 month(s)  The format for your next appointment:   In Person  Provider:   David Harding, MD    

## 2023-10-10 ENCOUNTER — Encounter: Payer: Self-pay | Admitting: Cardiology

## 2023-10-10 DIAGNOSIS — Z716 Tobacco abuse counseling: Secondary | ICD-10-CM | POA: Insufficient documentation

## 2023-10-10 NOTE — Assessment & Plan Note (Signed)
 Continues to smoke. Previous cessation attempts with bupropion  caused headaches. Hypnotism was effective but not sustained. - Encourage reduction of smoking, especially during trips.  Occasional dyspnea attributed to smoking. No recent exacerbations. - Continue use of inhaler as needed for COPD symptoms.  Smoking cessation instruction/counseling given:  counseled patient on the dangers of tobacco use, advised patient to stop smoking, and reviewed strategies to maximize success Has tried Chantix , but not successful, also tried BuSpar.  He has done well initially but has always relapsed.  Needs something for maintenance.  4 minutes

## 2023-10-10 NOTE — Assessment & Plan Note (Signed)
 He does not routinely use CPAP.  Defer to PCP.

## 2023-10-10 NOTE — Assessment & Plan Note (Signed)
 NYHA Class I symptoms.  Euvolemic on exam.  No diuretic requirement. BP has been somewhat labile, low threshold to consider ARB for afterload reduction but otherwise continue Toprol  25 mg daily.

## 2023-10-10 NOTE — Assessment & Plan Note (Signed)
 Continue to encourage smoking cessation.  Continue Advair and albuterol .

## 2023-10-10 NOTE — Assessment & Plan Note (Signed)
 BP has been pretty much well-controlled on Toprol .  Somewhat elevated today likely due to rapidly walking into the clinic today. - Continue metoprolol  for blood pressure management. - Low threshold to consider reinitiating ARB for afterload reduction if BP continues to be elevated.SABRASABRA

## 2023-10-10 NOTE — Assessment & Plan Note (Signed)
 Managed with rosuvastatin  40 mg and fenofibrate  48 mg daily along with fish oil  3 g daily - Continue current cholesterol management.  A1c 6.2.  Not currently on any medications.  Monitor closely

## 2023-10-10 NOTE — Assessment & Plan Note (Signed)
 Coronary artery disease, multiple STEMI's coronary trees; status/post-multivessel PCI, on standing maintenance Thienopyridine antiplatelet therapy Well-managed post-PCI status with no symptoms. Continues on prasugrel /Effient  10 mg daily - Continue low-dose Toprol  25 mg daily along with combination of fenofibrate  48 mg daily, rosuvastatin  40 mg daily, and omega-3 fatty acids - Continue Effient  (prasugrel ) 10 mg daily for antiplatelet therapy. - Okay to hold Effient  7 to 9 days preop for surgeries or procedures. - Ensure prescription is filled with generic Effient  as per insurance requirements.

## 2023-10-10 NOTE — Assessment & Plan Note (Signed)
 He has had ST elevations involving the RCA, LAD and LCx-OM 1.  Despite this, EF is relatively well-preserved at 45 to 50% with inferior posterior hypokinesis.  I suspect the inferior posterior wall was affected by 2 infarct to the RCA, the LCx infarct as well as apical LAD. No arrhythmias.  No angina or heart failure. Continue stable regimen.

## 2023-10-21 ENCOUNTER — Other Ambulatory Visit: Payer: Self-pay | Admitting: Cardiology

## 2023-10-21 ENCOUNTER — Other Ambulatory Visit: Payer: Self-pay | Admitting: Internal Medicine

## 2023-10-21 ENCOUNTER — Ambulatory Visit: Payer: PPO

## 2023-10-21 DIAGNOSIS — H6123 Impacted cerumen, bilateral: Secondary | ICD-10-CM | POA: Diagnosis not present

## 2023-10-21 DIAGNOSIS — J302 Other seasonal allergic rhinitis: Secondary | ICD-10-CM | POA: Diagnosis not present

## 2023-10-21 DIAGNOSIS — Z0000 Encounter for general adult medical examination without abnormal findings: Secondary | ICD-10-CM | POA: Diagnosis not present

## 2023-10-21 NOTE — Telephone Encounter (Signed)
 Requested Prescriptions  Pending Prescriptions Disp Refills   albuterol  (VENTOLIN  HFA) 108 (90 Base) MCG/ACT inhaler [Pharmacy Med Name: ALBUTEROL  HFA 90 MCG INHALER] 25.5 g 1    Sig: INHALE 2 PUFFS BY MOUTH EVERY 6 HOURS AS NEEDED FOR SHORTNESS OF BREATH     Pulmonology:  [facility]onists 2 Passed - 10/21/2023  5:58 PM      Passed - Last BP in normal range    BP Readings from Last 1 Encounters:  10/05/23 138/78         Passed - Last Heart Rate in normal range    Pulse Readings from Last 1 Encounters:  10/05/23 95         Passed - Valid encounter within last 12 months    Recent Outpatient Visits           3 months ago Hordeolum externum of right lower eyelid   [place] [facility] [place], [place], NP   4 months ago Type 2 diabetes mellitus with complication [facility])   [place] [facility] [place], Angeline ORN, [place]

## 2023-10-21 NOTE — Progress Notes (Signed)
 Subjective:   Troy Parrish. is a 63 y.o. who presents for a Medicare Wellness preventive visit.  As a reminder, Annual Wellness Visits don't include a physical exam, and some assessments may be limited, especially if this visit is performed virtually. We may recommend an in-person follow-up visit with your provider if needed.  Visit Complete: Virtual I connected with  Troy Parrish. on 10/21/23 by a audio enabled telemedicine application and verified that I am speaking with the correct person using two identifiers.  Patient Location: Home  Provider Location: Home Office  I discussed the limitations of evaluation and management by telemedicine. The patient expressed understanding and agreed to proceed.  Vital Signs: Because this visit was a virtual/telehealth visit, some criteria may be missing or patient reported. Any vitals not documented were not able to be obtained and vitals that have been documented are patient reported.  VideoDeclined- This patient declined Librarian, academic. Therefore the visit was completed with audio only.  Persons Participating in Visit: Patient.  AWV Questionnaire: No: Patient Medicare AWV questionnaire was not completed prior to this visit.  Cardiac Risk Factors include: advanced age (>64men, >40 women);dyslipidemia;hypertension;male gender;sedentary lifestyle;smoking/ tobacco exposure     Objective:    There were no vitals filed for this visit. There is no height or weight on file to calculate BMI.     10/21/2023   11:44 AM 10/15/2022   11:30 AM 01/26/2022   10:19 PM 11/19/2021    8:43 AM 10/09/2021   10:22 AM 09/11/2020    7:56 AM 01/23/2018    9:14 PM  Advanced Directives  Does Patient Have a Medical Advance Directive? No No No Yes No Yes No   Type of Aeronautical engineer of [place];Living will  Healthcare Power of [place];Living will   Does patient want to make changes to medical advance  directive?    No - Patient declined  No - Guardian declined   Copy of Healthcare Power of Attorney in Chart?    No - copy requested  No - copy requested   Would patient like information on creating a medical advance directive? No - Patient declined No - Patient declined   No - Patient declined       Data saved with a previous flowsheet row definition    Current Medications (verified) Outpatient Encounter Medications as of 10/21/2023  Medication Sig   albuterol  (VENTOLIN  HFA) 108 (90 Base) MCG/ACT inhaler INHALE 2 PUFFS BY MOUTH EVERY 6 HOURS AS NEEDED FOR SHORTNESS OF BREATH   Blood Glucose Monitoring Suppl DEVI 1 each by Does not apply route in the morning, at noon, and at bedtime. May substitute to any manufacturer covered by patient's insurance.   Cholecalciferol 50 MCG (2000 UT) CAPS Take 2,000 Units by mouth.   clobetasol  cream (TEMOVATE ) 0.05 % Apply 1 Application topically daily as needed (eczema).   EFFIENT  10 MG TABS tablet Take 1 tablet (10 mg total) by mouth daily.   fenofibrate  (TRICOR ) 48 MG tablet TAKE 1 TABLET BY MOUTH DAILY   fluticasone  (FLONASE ) 50 MCG/ACT nasal spray Place 2 sprays into both nostrils daily. (Patient taking differently: Place 2 sprays into both nostrils daily. TAKES PRN)   fluticasone -salmeterol (ADVAIR) 100-50 MCG/ACT AEPB INHALE 1 PUFF BY MOUTH 2 TIMES A DAY - IN THE MORNING AND IN THE EVENING   gabapentin (NEURONTIN) 100 MG capsule Take 100 mg by mouth at bedtime.   ketoconazole  (NIZORAL ) 2 %  shampoo Apply 1 Application topically 2 (two) times a week.   Lancets (ONETOUCH DELICA PLUS LANCET33G) MISC USE TO TEST BLOOD SUGAR UP TO 4 TIMES DAILY   metoprolol  succinate (TOPROL -XL) 25 MG 24 hr tablet TAKE 1 AND 1/2 TABLET BY MOUTH EVERY EVENING   nitroGLYCERIN  (NITROSTAT ) 0.4 MG SL tablet Place 1 tablet (0.4 mg total) under the tongue every 5 (five) minutes as needed for chest pain.   Omega-3 Fatty Acids (FISH OIL ) 1000 MG CPDR Take 3 g by mouth daily.    omeprazole  (PRILOSEC) 40 MG capsule TAKE 1 CAPSULE BY MOUTH DAILY   ONETOUCH ULTRA test strip USE 1 STRIP TO TEST 4 TIMES A DAY   rosuvastatin  (CRESTOR ) 40 MG tablet Take 1 tablet (40 mg total) by mouth daily.   sildenafil  (VIAGRA ) 50 MG tablet TAKE 1/2 TO 1 TABLET BY MOUTH AS NEEDED FOR ERECTILE DYSFUNCTION   erythromycin  ophthalmic ointment Place 1 Application into the right eye at bedtime. (Patient not taking: Reported on 10/21/2023)   No facility-administered encounter medications on file as of 10/21/2023.    Allergies (verified) Niacin  and related   History: Past Medical History:  Diagnosis Date   Bronchitis    Gets bronchitis almost every year   CAD S/P percutaneous coronary angioplasty 06/2011; 6/ & 11/2013   S/P PCI to all 3 major vessels;a)  Ant STEMI 2001- BMS-LAD x2 -->(redo PCI 6/'15 -- pLAD Xience DES 2.5 x 12- 2.75 mm, mLAD 2.25 x 12 - 2.7 mm), b) '06 UA --> Cx- OM DES; c) 2013 Inf MI BMS mRCA --> d) 10/'15 PCI dRCA Promus P DES 4.0 x 16 (4.25 mm); PTCA of RPL2 (2.0 mm) &RPDA (2.25 mm) 11/2013; 4/18 PCI OM1 Synergy DES  2.5 x 16   COPD (chronic obstructive pulmonary disease) (HCC)    Elevated WBC count    Emphysema of lung (HCC)    Essential hypertension 07/07/2011   Former heavy tobacco smoker     quit in May 2015 after multiple attempts at trying to quit before    GERD (gastroesophageal reflux disease)    Glucose intolerance (pre-diabetes) June 2015    hemoglobin A1c 6.6   Headache    Imdur  related; stopped taking it; headaches went away (11/15/2013)   Heart murmur    History of colon polyps    History of stomach ulcers    Hyperlipidemia with target LDL less than 70 07/07/2011   Metabolic syndrome    Pre-diabetes, hypertension and truncal obesity as well as dyslipidemia   OSA on CPAP    Pneumonia several times   Recurrent upper respiratory infection (URI)    Seizures (HCC) several   last one was ~ 2011 (11/15/2013)   Shortness of breath dyspnea    ST  elevation myocardial infarction (STEMI) of anterior wall (HCC) 2001   History of -- 2 stents in early and distal mid LAD; prior cardiologist was Dr. Aloysius Cedar in [place]   ST elevation myocardial infarction (STEMI) of inferior wall (HCC) 07/07/2011   Occluded RCA 2.75X18 INTEGRITY; Echo 07/2013: EF 45-50%, Inferior & Posterolateral HK.    Umbilical hernia    Unstable angina (HCC) 2006; 07/2013   Cx-OM - PCI 2.5 mm 13 mm Cypher DES [place], [place]) ; 07/2013: Severe ISR of both prox & Distal LAD stentS --> 2 DES stents (1 at prox edge of the proximal stent, 2nd covers the entire distal stent as well as proximal and distal edge stenose)    Upper respiratory infection  Past Surgical History:  Procedure Laterality Date   BIOPSY N/A 06/30/2020   Procedure: BIOPSY;  Surgeon: Jinny Carmine, MD;  Location: [facility]GERY [facility];  Service: Endoscopy;  Laterality: N/A;   Cardiac MRI [facility]  09/2014   EF 51%. Mod HK of basal-mid Inf wall, mild HK of basal inferoseptum & basal-mid inferolateral wall with hyper enhancement (c/w subendocard RCA MI with significant viability), focal hyper enhancement of apical wall c/w dLAD subendocard MI & complete LAD viability. . No aortic stenosis. Normal RV function. No Ischemia on Adenosine  Stress.   CATARACT EXTRACTION W/PHACO Left 11/19/2021   Procedure: CATARACT EXTRACTION PHACO AND INTRAOCULAR LENS PLACEMENT (IOC) LEFT vivity lens;  Surgeon: Enola Feliciano Hugger, MD;  Location: [facility];  Service: Ophthalmology;  Laterality: Left;  11.32 1:32.6   COLONOSCOPY WITH PROPOFOL  N/A 03/28/2015   Procedure: COLONOSCOPY WITH PROPOFOL ;  Surgeon: Carmine Jinny, MD;  Location: [facility];  Service: Endoscopy;  Laterality: N/A;   COLONOSCOPY WITH PROPOFOL  N/A 06/30/2020   Procedure: COLONOSCOPY WITH PROPOFOL ;  Surgeon: Jinny Carmine, MD;  Location: [facility];  Service: Endoscopy;  Laterality: N/A;   CORONARY ANGIOPLASTY WITH STENT PLACEMENT  2001    ANTERIOR mi with a  PCI to LAD; [place], [place] - Dr. Woodie   CORONARY ANGIOPLASTY WITH STENT PLACEMENT  2006   [place] by Dr Woodie -lesion lft circ/OM1 with 2.5 x 13mm Cypher DES   CORONARY STENT INTERVENTION N/A 05/19/2016   Procedure: Coronary Stent Intervention;  Surgeon: Alm LELON [place], MD;  Location: [facility] INVASIVE CV LAB;  Service: Cardiovascular: PCI pOM1 - Synergy DES 2.5 x 16 (overlaps old stent)   ESOPHAGOGASTRODUODENOSCOPY (EGD) WITH PROPOFOL  N/A 03/28/2015   Procedure: ESOPHAGOGASTRODUODENOSCOPY (EGD) WITH PROPOFOL ;  Surgeon: Carmine Jinny, MD;  Location: [facility];  Service: Endoscopy;  Laterality: N/A;   ESOPHAGOGASTRODUODENOSCOPY (EGD) WITH PROPOFOL  N/A 06/30/2020   Procedure: ESOPHAGOGASTRODUODENOSCOPY (EGD) WITH PROPOFOL ;  Surgeon: Jinny Carmine, MD;  Location: [facility];  Service: Endoscopy;  Laterality: N/A;   ESOPHAGOGASTRODUODENOSCOPY (EGD) WITH PROPOFOL  N/A 09/11/2020   Procedure: ESOPHAGOGASTRODUODENOSCOPY (EGD) WITH PROPOFOL ;  Surgeon: Jinny Carmine, MD;  Location: [facility] SURGERY [facility];  Service: Endoscopy;  Laterality: N/A;   INSERTION OF MESH N/A 04/02/2015   Procedure: INSERTION OF MESH;  Surgeon: Charlie FORBES Fell, MD;  Location: [facility] ORS;  Service: General;  Laterality: N/A;   LEFT AND RIGHT HEART CATHETERIZATION WITH CORONARY ANGIOGRAM N/A 07/30/2013   Procedure: LEFT AND RIGHT HEART CATHETERIZATION WITH CORONARY ANGIOGRAM;  Surgeon: Alm LELON [place], MD;  Location: [facility] CATH LAB;  Service: Cardiovascular;  2 separate P & mLAD lesions -> PCI, MOd-Severe dRCA disesase with diffuse RPL/PDA disease (FFR)   LEFT HEART CATH  08/03/2013   Procedure: LEFT HEART CATH;  Surgeon: Alm LELON [place], MD;  Location: [facility] CATH LAB;  Service: Cardiovascular;;FFR of RCA - non-flow-limiting   LEFT HEART CATH AND CORONARY ANGIOGRAPHY N/A 05/19/2016   Procedure: Left Heart Cath and Coronary Angiography;  Surgeon: Alm LELON [place], MD;  Location: [facility] INVASIVE CV LAB;  Service:  Cardiovascular: CULPRIT - 85% pOM1 pre-stent. Stents in both proximal and distal RCA widely patent. PTCA sites in RPL & PDA widely patent with stable 70% small RPL 2. Extensive stenting in the LAD widely patent stents with mild to mod Dz btw prox & mid-distal stents.    LEFT HEART CATH AND CORONARY ANGIOGRAPHY N/A 02/13/2018   Procedure: LEFT HEART CATH AND CORONARY ANGIOGRAPHY;  Surgeon: [place] Alm LELON, MD;  Location: [facility] INVASIVE  CV LAB;  Service: Cardiovascular: Similar findings to last cath post PCI.  Stents are patent.  Roughly 50% proximal LAD lesion noted, may be progression from prior cath but otherwise stable disease.   LEFT HEART CATHETERIZATION WITH CORONARY ANGIOGRAM N/A 07/07/2011   Procedure: LEFT HEART CATHETERIZATION WITH CORONARY ANGIOGRAM;  Surgeon: Alm LELON [place], MD;  Location: [facility] CATH LAB;  Service: Cardiovascular;  inferolat post LV infarct ST-elevation MI   MET/CPET  11/09/2011   submax. effort 1.05 RER peak V02 was 51% ,chronotropic incomp.hrt rate lows 80s to 100   MET/CPET  September 2016   [facility]: Mild functional impairment due primarily to mild pulmonary and circulatory limitations. Also suggest physical deconditioning (seen with low-normal aerobic reserve). Mild VQ mismatch with exercise.  Ventilatory reserve exhausted with peak exercise demonstrated pulmonary limitation. No significant decrease in postexercise FEV1 compared to rest --> suggest no exercise induced bronchospasm   NM MYOVIEW  ([facility] HX)  11/30/2011   EF 45% ,exercise 10 METS, infarct\scar w mild perinfarct ischemia -basal inferolat and mid inferolat region   NM MYOVIEW  LTD  04/25/2016   Exercised for 9 minutes. Reached 90% max. Heart rate. 9.4 METS. Read as a low risk study with normal EF 55-65%. -> on Review -  there does appear to be a small to moderate sized, medium severtiy reversible perfusion defect in the anterior wall - read by computer, but not noted by reader.   PERCUTANEOUS CORONARY STENT INTERVENTION  (PCI-S)  07/07/2011   Procedure: PERCUTANEOUS CORONARY STENT INTERVENTION (PCI-S);  Surgeon: Alm LELON [place], MD;  Location: [facility] CATH LAB;  Service: Cardiovascular;;occluded RCA ,Integrity Resolute DES 2.75 x 18 mm stent - post-dilated 3.1 mm   PERCUTANEOUS CORONARY STENT INTERVENTION (PCI-S)  07/30/2013   Procedure: PERCUTANEOUS CORONARY STENT INTERVENTION (PCI-S);  Surgeon: Alm LELON [place], MD;  Location: [facility] CATH LAB;  Service: Cardiovascular;;Distal mid LAD: Xience Alpine DES 2.25 mm x 28 mm (overlapping proximal and distal edge of previous stent) - 2.7 mm; proximal LAD 2.5 mm x 12 mm Xience Alpine DES (2.8 mm);; RCA 60-70% stenosis planned staged procedure   PERCUTANEOUS CORONARY STENT INTERVENTION (PCI-S) N/A 11/15/2013   Procedure: PERCUTANEOUS CORONARY STENT INTERVENTION (PCI-S);  Surgeon: Alm LELON [place], MD;  Location: [facility] CATH LAB;  Service: Cardiovascular;  Patent Cx & LAD Stents; PCI -dRCA Promus Premier DES 4.0 mm x 16 mm (4.25 mm) dRCA, 2.0 mm Cutting PTCA of RPL2 [place] & POBA of mRPDA   POLYPECTOMY  03/28/2015   Procedure: POLYPECTOMY;  Surgeon: Rogelia Copping, MD;  Location: [facility] SURGERY [facility]TR;  Service: Endoscopy;;   POLYPECTOMY N/A 06/30/2020   Procedure: POLYPECTOMY;  Surgeon: Copping Rogelia, MD;  Location: [facility] SURGERY CNTR;  Service: Endoscopy;  Laterality: N/A;   RIGHT HEART CATH  June 2015   Normal pressures with severely reduced cardiac output and index. (3.25/1.55)   TRANSTHORACIC ECHOCARDIOGRAM  07/31/2013; February 2017   a. LVEF 45-50. Inferior posterior hypokinesis with mild dilation. Apparent normal diastolic pressures;    UMBILICAL HERNIA REPAIR N/A 04/02/2015   Procedure: HERNIA REPAIR UMBILICAL ADULT;  Surgeon: Charlie FORBES Fell, MD;  Location: [facility] ORS;  Service: General;  Laterality: N/A;   Family History  Problem Relation Age of Onset   Coronary artery disease Father    Heart disease Father    Stroke Father    Hypertension Father    Hyperlipidemia Mother    Diabetes  Mother    Hyperlipidemia Maternal Grandmother    Diabetes Maternal Grandmother    Hyperlipidemia Maternal  Grandfather    Diabetes Maternal Grandfather    Emphysema Maternal Grandfather    Heart disease Paternal Grandmother    Heart disease Paternal Grandfather    Cancer Maternal Aunt        breast   Social History   Socioeconomic History   Marital status: Married    Spouse name: Not on file   Number of children: 8   Years of education: Not on file   Highest education level: 11th grade  Occupational History   Not on file  Tobacco Use   Smoking status: Every Day    Current packs/day: 2.25    Average packs/day: 2.2 packs/day for 48.7 years (109.6 ttl pk-yrs)    Types: Cigarettes    Start date: 34   Smokeless tobacco: Never   Tobacco comments:    Quit with Bupropion  150mg  - > unfortunately, he restarted; has cut all the way down to maybe 1 or 2 cigarettes a day, some days he goes without any.-Some days even multiple days without cigarettes.    12/31/2022 patient smokes about 14 cigarettes daily  Vaping Use   Vaping status: Never Used  Substance and Sexual Activity   Alcohol use: Yes    Comment: rare   Drug use: No   Sexual activity: Not on file  Other Topics Concern   Not on file  Social History Narrative   He is a married father of 8, grandfather of 8. He does not really get routine exercise.    He is down to 1-2 cigarettes a day-sometimes going more than 2 to 3 days without 1.. He is very seriously wanting to quit.     He has a social alcoholic beverage every now and then.       He is no longer working, medically retired essentially.  His wife still works.  He and his wife enjoy traveling have done multiple trips and vacations over the last couple years including several trips to [place]  to be near her family.      Late summer 2023-2-week trip to [place]  [facility], and then drove up to the [employer] including seeing [place] and [place].  Lots  of walking and hiking.   Social Drivers of Corporate investment banker Strain: Low Risk  (10/21/2023)   Overall Financial Resource Strain (CARDIA)    Difficulty of Paying Living Expenses: Not hard at all  Food Insecurity: No Food Insecurity (10/21/2023)   Hunger Vital Sign    Worried About Running Out of Food in the Last Year: Never true    Ran Out of Food in the Last Year: Never true  Transportation Needs: No Transportation Needs (10/21/2023)   PRAPARE - Administrator, Civil Service (Medical): No    Lack of Transportation (Non-Medical): No  Physical Activity: Insufficiently Active (10/21/2023)   Exercise Vital Sign    Days of Exercise per Week: 1 day    Minutes of Exercise per Session: 10 min  Stress: No Stress Concern Present (10/21/2023)   [employer] of Occupational Health - Occupational Stress Questionnaire    Feeling of Stress: Not at all  Social Connections: Moderately Integrated (10/21/2023)   Social Connection and Isolation Panel    Frequency of Communication with Friends and Family: More than three times a week    Frequency of Social Gatherings with Friends and Family: More than three times a week    Attends Religious Services: 1 to 4 times per year  Active Member of Clubs or Organizations: No    Attends Banker Meetings: Never    Marital Status: Married    Tobacco Counseling Ready to quit: Not Answered Counseling given: Not Answered Tobacco comments: Quit with Bupropion  150mg  - > unfortunately, he restarted; has cut all the way down to maybe 1 or 2 cigarettes a day, some days he goes without any.-Some days even multiple days without cigarettes. 12/31/2022 patient smokes about 14 cigarettes daily    Clinical Intake:  Pre-visit preparation completed: Yes  Pain : No/denies pain     BMI - recorded: 24.3 Nutritional Status: BMI of 19-24  Normal Nutritional Risks: None Diabetes: No  Lab Results  Component Value Date   HGBA1C 6.2  (H) 06/15/2023   HGBA1C 5.9 (H) 12/16/2022   HGBA1C 6.1 (A) 06/15/2022     How often do you need to have someone help you when you read instructions, pamphlets, or other written materials from your doctor or pharmacy?: 1 - Never  Interpreter Needed?: No  Information entered by :: JHONNIE DAS, LPN   Activities of Daily Living    10/21/2023   11:45 AM 12/16/2022   10:00 AM  In your present state of health, do you have any difficulty performing the following activities:  Hearing? 0 0  Vision? 0 0  Difficulty concentrating or making decisions? 0 0  Walking or climbing stairs? 0 0  Dressing or bathing? 0 0  Doing errands, shopping? 0 0  Preparing Food and eating ? N   Using the Toilet? N   In the past six months, have you accidently leaked urine? N   Do you have problems with loss of bowel control? N   Managing your Medications? N   Managing your Finances? N   Housekeeping or managing your Housekeeping? N     Patient Care Team: Antonette Angeline ORN, NP as PCP - General (Internal Medicine) Anner Alm ORN, MD as PCP - Cardiology (Cardiology) [facility], [facility] (Optometry)  I have updated your Care Teams any recent Medical Services you may have received from other providers in the past year.     Assessment:   This is a routine wellness examination for [place].  Hearing/Vision screen Hearing Screening - Comments:: NO AIDS Vision Screening - Comments:: READERS-    Goals Addressed             This Visit's Progress    Cut out extra servings         Depression Screen     10/21/2023   11:42 AM 07/19/2023    2:10 PM 06/15/2023    9:48 AM 12/16/2022   10:00 AM 10/15/2022   11:28 AM 06/15/2022    8:51 AM 02/26/2022   11:06 AM  PHQ 2/9 Scores  PHQ - 2 Score 0 0 0 0 0 0 0  PHQ- 9 Score 0 0 0  0  0    Fall Risk     10/21/2023   11:45 AM 07/19/2023    2:10 PM 06/15/2023    9:48 AM 12/16/2022   10:00 AM 10/15/2022   11:31 AM  Fall Risk   Falls in the past year? 0 0 0 0 0   Number falls in past yr: 0    0  Injury with Fall? 0    0  Risk for fall due to : No Fall Risks    No Fall Risks  Follow up Falls evaluation completed;Falls prevention discussed    Falls  prevention discussed;Falls evaluation completed    MEDICARE RISK AT HOME:  Medicare Risk at Home Any stairs in or around the home?: Yes If so, are there any without handrails?: No Home free of loose throw rugs in walkways, pet beds, electrical cords, etc?: Yes Adequate lighting in your home to reduce risk of falls?: Yes Life alert?: No Use of a cane, walker or w/c?: No Grab bars in the bathroom?: No Shower chair or bench in shower?: No Elevated toilet seat or a handicapped toilet?: No  TIMED UP AND GO:  Was the test performed?  No  Cognitive Function: 6CIT completed        10/21/2023   11:47 AM 10/15/2022   11:32 AM 10/09/2021   10:24 AM  6CIT Screen  What Year? 0 points 0 points 0 points  What month? 0 points 0 points 0 points  What time? 0 points 0 points 0 points  Count back from 20 0 points 0 points 0 points  Months in reverse 0 points 0 points 0 points  Repeat phrase 0 points 0 points 0 points  Total Score 0 points 0 points 0 points    Immunizations Immunization History  Administered Date(s) Administered   Influenza Split 11/09/2012   Influenza, Seasonal, Injecte, Preservative Fre 12/07/2022   Influenza,inj,Quad PF,6+ Mos 11/16/2013, 11/29/2014, 11/11/2015, 11/10/2016, 12/01/2017, 12/07/2018, 12/26/2019, 12/03/2020, 11/23/2021   Moderna SARS-COV2 Booster Vaccination 05/08/2020   Moderna Sars-Covid-2 Vaccination 04/25/2019, 05/14/2019, 01/10/2020, 10/30/2020   Pneumococcal Conjugate-13 11/29/2014, 05/05/2020   Pneumococcal Polysaccharide-23 07/11/2011, 11/10/2016, 01/02/2019   Tdap 03/04/2015   Zoster Recombinant(Shingrix) 11/24/2022, 03/07/2023    Screening Tests Health Maintenance  Topic Date Due   Influenza Vaccine  09/09/2023   HEMOGLOBIN A1C  12/16/2023   Lung Cancer  Screening  01/25/2024   Diabetic kidney evaluation - eGFR measurement  06/14/2024   Diabetic kidney evaluation - Urine ACR  06/14/2024   FOOT EXAM  06/14/2024   OPHTHALMOLOGY EXAM  09/19/2024   Medicare Annual Wellness (AWV)  10/20/2024   DTaP/Tdap/Td (2 - Td or Tdap) 03/03/2025   Pneumococcal Vaccine: 50+ Years (3 of 3 - PCV20 or PCV21) 05/05/2025   Colonoscopy  06/30/2025   HIV Screening  Completed   Zoster Vaccines- Shingrix  Completed   Hepatitis C Screening  Addressed   Hepatitis B Vaccines 19-59 Average Risk  Aged Out   HPV VACCINES  Aged Out   Meningococcal B Vaccine  Aged Out   COVID-19 Vaccine  Discontinued    Health Maintenance Items Addressed: UP TO DATE W/ SHOTS EXCEPT COVID;UP TO DATE ON COLONOSCOPY   Additional Screening:  Vision Screening: Recommended annual ophthalmology exams for early detection of glaucoma and other disorders of the eye. Is the patient up to date with their annual eye exam?  Yes  Who is the provider or what is the name of the office in which the patient attends annual eye exams? [facility]- DR.HAMPTON  Dental Screening: Recommended annual dental exams for proper oral hygiene  Community Resource Referral / Chronic Care Management: CRR required this visit?  No   CCM required this visit?  No   Plan:    I have personally reviewed and noted the following in the patient's chart:   Medical and social history Use of alcohol, tobacco or illicit drugs  Current medications and supplements including opioid prescriptions. Patient is not currently taking opioid prescriptions. Functional ability and status Nutritional status Physical activity Advanced directives List of other physicians Hospitalizations, surgeries, and ER visits in  previous 12 months Vitals Screenings to include cognitive, depression, and falls Referrals and appointments  In addition, I have reviewed and discussed with patient certain preventive protocols, quality metrics,  and best practice recommendations. A written personalized care plan for preventive services as well as general preventive health recommendations were provided to patient.   Jhonnie GORMAN Das, LPN   [id number]   After Visit Summary: (MyChart) Due to this being a telephonic visit, the after visit summary with patients personalized plan was offered to patient via MyChart   Notes: Nothing significant to report at this time.

## 2023-10-21 NOTE — Patient Instructions (Addendum)
 Mr. Troy Parrish,  Thank you for taking the time for your Medicare Wellness Visit. I appreciate your continued commitment to your health goals. Please review the care plan we discussed, and feel free to reach out if I can assist you further.  Medicare recommends these wellness visits once per year to help you and your care team stay ahead of potential health issues. These visits are designed to focus on prevention, allowing your provider to concentrate on managing your acute and chronic conditions during your regular appointments.  Please note that Annual Wellness Visits do not include a physical exam. Some assessments may be limited, especially if the visit was conducted virtually. If needed, we may recommend a separate in-person follow-up with your provider.  Ongoing Care Seeing your primary care provider every 3 to 6 months helps us  monitor your health and provide consistent, personalized care.   Referrals If a referral was made during today's visit and you haven't received any updates within two weeks, please contact the referred provider directly to check on the status.  Recommended Screenings:  Health Maintenance  Topic Date Due   Flu Shot  09/09/2023   Hemoglobin A1C  12/16/2023   Screening for Lung Cancer  01/25/2024   Yearly kidney function blood test for diabetes  06/14/2024   Yearly kidney health urinalysis for diabetes  06/14/2024   Complete foot exam   06/14/2024   Eye exam for diabetics  09/19/2024   Medicare Annual Wellness Visit  10/20/2024   DTaP/Tdap/Td vaccine (2 - Td or Tdap) 03/03/2025   Pneumococcal Vaccine for age over 25 (3 of 3 - PCV20 or PCV21) 05/05/2025   Colon Cancer Screening  06/30/2025   HIV Screening  Completed   Zoster (Shingles) Vaccine  Completed   Hepatitis C Screening  Addressed   Hepatitis B Vaccine  Aged Out   HPV Vaccine  Aged Out   Meningitis B Vaccine  Aged Out   COVID-19 Vaccine  Discontinued     Advance Care Planning is important because  it: Ensures you receive medical care that aligns with your values, goals, and preferences. Provides guidance to your family and loved ones, reducing the emotional burden of decision-making during critical moments.  Vision: Annual vision screenings are recommended for early detection of glaucoma, cataracts, and diabetic retinopathy. These exams can also reveal signs of chronic conditions such as diabetes and high blood pressure.  Dental: Annual dental screenings help detect early signs of oral cancer, gum disease, and other conditions linked to overall health, including heart disease and diabetes.  Please see the attached documents for additional preventive care recommendations.   NEXT AWV 11/02/24 @ 10:50 AM BY PHONE

## 2023-10-24 ENCOUNTER — Other Ambulatory Visit: Payer: Self-pay | Admitting: Cardiology

## 2023-10-24 ENCOUNTER — Ambulatory Visit

## 2023-10-24 DIAGNOSIS — Z23 Encounter for immunization: Secondary | ICD-10-CM

## 2023-10-24 MED ORDER — SILDENAFIL CITRATE 50 MG PO TABS: ORAL_TABLET | ORAL | 6 refills | Status: DC

## 2023-11-18 ENCOUNTER — Other Ambulatory Visit (HOSPITAL_COMMUNITY): Payer: Self-pay

## 2023-11-18 ENCOUNTER — Other Ambulatory Visit: Payer: Self-pay

## 2023-11-22 ENCOUNTER — Other Ambulatory Visit (HOSPITAL_COMMUNITY): Payer: Self-pay

## 2023-12-01 DIAGNOSIS — K644 Residual hemorrhoidal skin tags: Secondary | ICD-10-CM | POA: Diagnosis not present

## 2023-12-01 DIAGNOSIS — L290 Pruritus ani: Secondary | ICD-10-CM | POA: Diagnosis not present

## 2023-12-05 ENCOUNTER — Other Ambulatory Visit: Payer: Self-pay | Admitting: Medical Genetics

## 2023-12-05 DIAGNOSIS — Z006 Encounter for examination for normal comparison and control in clinical research program: Secondary | ICD-10-CM

## 2023-12-14 DIAGNOSIS — M62838 Other muscle spasm: Secondary | ICD-10-CM | POA: Diagnosis not present

## 2023-12-14 DIAGNOSIS — M79645 Pain in left finger(s): Secondary | ICD-10-CM | POA: Diagnosis not present

## 2023-12-14 DIAGNOSIS — R200 Anesthesia of skin: Secondary | ICD-10-CM | POA: Diagnosis not present

## 2023-12-15 DIAGNOSIS — K644 Residual hemorrhoidal skin tags: Secondary | ICD-10-CM | POA: Diagnosis not present

## 2023-12-15 NOTE — H&P (View-Only) (Signed)
 History of Present Illness Troy Parrish is a 63 year old male who presents with anal skin tags causing discomfort and itching referred by Dr. Unk.  He has had anal skin tags for several years, initially unnoticed until he began causing discomfort. In June, he was told by Doctor Unk that the issue was a skin tag rather than a hemorrhoid. He was advised to use witch hazel and later prescribed creams, which he has not used consistently.  His symptoms include itching and pain, particularly when wiping, sometimes leading to bleeding. The symptoms improve with consistent use of the prescribed cream but return when he forgets to apply it. He has experienced similar issues with skin tags in the past, having had one removed from his thigh that grew significantly in size.  No issues with bowel movements such as constipation or diarrhea. He has a history of a skin condition on his arm, treated by a dermatologist with some improvement, though it still occasionally itches. The dermatologist suggested it might be related to a yeast infection.  He is currently using two creams prescribed by his doctor, one of which he believes might be hydrocortisone, but he is unsure of the exact names. He obtained these from the pharmacy and has been using them intermittently.      PAST MEDICAL HISTORY:  Past Medical History:  Diagnosis Date  . CAD (coronary artery disease)    11 stents  . COPD (chronic obstructive pulmonary disease) (CMS/HHS-HCC)         PAST SURGICAL HISTORY:   Past Surgical History:  Procedure Laterality Date  . UMBILICAL HERNIA REPAIR           MEDICATIONS:  Outpatient Encounter Medications as of 12/15/2023  Medication Sig Dispense Refill  . albuterol  90 mcg/actuation inhaler Inhale into the lungs as needed.    . cholecalciferol (VITAMIN D3) 2,000 unit capsule Take 2,000 Units by mouth once daily    . clotrimazole (LOTRIMIN) 1 % cream Apply topically 2 (two) times daily 28 g 0  .  cyanocobalamin  (VITAMIN B12) 1000 MCG tablet Take 1,000 mcg by mouth once daily    . fenofibrate  40 mg Tab     . fluticasone  propion-salmeteroL (ADVAIR DISKUS) 100-50 mcg/dose diskus inhaler INHALE 1 PUFF BY MOUTH 2 TIMES A DAY - IN THE MORNING AND IN THE EVENING    . gabapentin (NEURONTIN) 100 MG capsule Take 1 capsule (100 mg total) by mouth 2 (two) times daily 180 capsule 1  . hydrocortisone 2.5 % cream Apply topically 2 (two) times daily 30 g 0  . metoprolol  succinate (TOPROL -XL) 25 MG XL tablet Take 25 mg by mouth once daily    . nitroGLYcerin  (NITROSTAT ) 0.4 MG SL tablet Place 1 tablet (0.4 mg total) under the tongue as needed. 25 tablet 11  . omeprazole  (PRILOSEC) 20 MG DR capsule Take 20 mg by mouth once daily    . prasugrel  (EFFIENT ) 10 mg tablet Take 1 tablet (10 mg total) by mouth once daily. 30 tablet 11  . rosuvastatin  (CRESTOR ) 20 MG tablet Take 1 tablet (20 mg total) by mouth nightly. 30 tablet 11  . sildenafiL  (VIAGRA ) 50 MG tablet Take 50 mg by mouth once daily as needed for Erectile Dysfunction    . aspirin  81 MG EC tablet Take 162 mg by mouth nightly. (Patient not taking: Reported on 07/09/2022)    . blood-glucose meter Misc 1 each (Patient not taking: Reported on 12/15/2023)    . buPROPion  (WELLBUTRIN  XL) 150 MG  XL tablet Take 150 mg by mouth once daily. (Patient not taking: Reported on 07/09/2022)    . clobetasol  (TEMOVATE ) 0.05 % cream Apply topically 2 (two) times daily. (Patient not taking: Reported on 12/15/2023)    . dexlansoprazole (DEXILANT) 60 mg DR capsule Take 60 mg by mouth once daily. (Patient not taking: Reported on 10/05/2022)    . ergocalciferol , vitamin D2, 1,250 mcg (50,000 unit) capsule Take 1 capsule once a week for 8 weeks (Patient not taking: Reported on 06/14/2023) 8 capsule 0  . erythromycin  (ROMYCIN) ophthalmic ointment Apply 1 Application to eye (Patient not taking: Reported on 12/01/2023)    . fenofibrate  nanocrystallized (TRICOR ) 48 MG tablet Take 48 mg by  mouth once daily (Patient not taking: Reported on 12/01/2023)    . isosorbide  mononitrate (IMDUR ) 30 MG ER tablet Take 1 tablet (30 mg total) by mouth once daily. (Patient not taking: Reported on 07/09/2022) 30 tablet 11  . levocetirizine (XYZAL ) 5 MG tablet Take 5 mg by mouth nightly. (Patient not taking: Reported on 07/09/2022)    . losartan  (COZAAR ) 25 MG tablet Take 1 tablet (25 mg total) by mouth once daily. (Patient not taking: Reported on 07/09/2022) 30 tablet 11  . metoprolol  succinate (TOPROL -XL) 50 MG XL tablet Take 2 tablets (100 mg total) by mouth once daily. (Patient not taking: Reported on 10/05/2022) 30 tablet 11  . niacin  (NIASPAN ) 500 MG ER tablet Take 1 tablet (500 mg total) by mouth nightly. (Patient not taking: Reported on 12/15/2023) 30 tablet 11  . ONETOUCH DELICA PLUS LANCET  (Patient not taking: Reported on 12/15/2023)    . ONETOUCH VERIO TEST STRIPS test strip  (Patient not taking: Reported on 12/15/2023)     No facility-administered encounter medications on file as of 12/15/2023.     ALLERGIES:   Niacin    SOCIAL HISTORY:  Social History   Socioeconomic History  . Marital status: Married  Tobacco Use  . Smoking status: Every Day    Current packs/day: 1.00    Types: Cigarettes  . Smokeless tobacco: Former    Quit date: 04/08/2013  Vaping Use  . Vaping status: Never Used  Substance and Sexual Activity  . Alcohol use: Not Currently  . Drug use: Never  . Sexual activity: Defer    Partners: Female   Social Drivers of Corporate Investment Banker Strain: Low Risk  (12/15/2023)   Overall Financial Resource Strain (CARDIA)   . Difficulty of Paying Living Expenses: Not hard at all  Food Insecurity: No Food Insecurity (12/15/2023)   Hunger Vital Sign   . Worried About Programme Researcher, Broadcasting/film/video in the Last Year: Never true   . Ran Out of Food in the Last Year: Never true  Transportation Needs: No Transportation Needs (12/15/2023)   PRAPARE - Transportation   . Lack of  Transportation (Medical): No   . Lack of Transportation (Non-Medical): No    FAMILY HISTORY:  Family History  Problem Relation Name Age of Onset  . Diabetes Mother    . Coronary Artery Disease (Blocked arteries around heart) Father    . Cancer Maternal Aunt    . Cancer Maternal Aunt       GENERAL REVIEW OF SYSTEMS:   General ROS: negative for - chills, fatigue, fever, weight gain or weight loss Allergy and Immunology ROS: negative for - hives  Hematological and Lymphatic ROS: negative for - bleeding problems or bruising, negative for palpable nodes Endocrine ROS: negative for - heat or cold intolerance,  hair changes Respiratory ROS: negative for - cough, shortness of breath or wheezing Cardiovascular ROS: no chest pain or palpitations GI ROS: negative for nausea, vomiting, abdominal pain, diarrhea, constipation Musculoskeletal ROS: negative for - joint swelling or muscle pain Neurological ROS: negative for - confusion, syncope Dermatological ROS: negative for pruritus and rash  PHYSICAL EXAM:  Vitals:   12/15/23 1436  BP: (!) 143/91  Pulse: 88  .  Ht:180.3 cm (5' 11) Wt:77.6 kg (171 lb) ADJ:Anib surface area is 1.97 meters squared. Body mass index is 23.85 kg/m.SABRA   GENERAL: Alert, active, oriented x3  HEENT: Pupils equal reactive to light. Extraocular movements are intact. Sclera clear. Palpebral conjunctiva normal red color.Pharynx clear.  NECK: Supple with no palpable mass and no adenopathy.  LUNGS: Sound clear with no rales rhonchi or wheezes.  HEART: Regular rhythm S1 and S2 without murmur.  ABDOMEN: Soft and depressible, nontender with no palpable mass, no hepatomegaly.   RECTAL: Large anterior external hemorrhoid tag and small right lateral tag.   EXTREMITIES: Well-developed well-nourished symmetrical with no dependent edema.  NEUROLOGICAL: Awake alert oriented, facial expression symmetrical, moving all extremities.  Assessment & Plan Anal skin tag with  associated pruritus ani   Chronic anal hemorrhoidal tag has caused pruritus ani for years, with recent worsening of itching and discomfort. The large external hemorrhoid tag at the anus complicates hygiene and may contribute to itching. Differential diagnosis includes idiopathic pruritus ani, possibly worsened by diet or skin conditions. Previous dermatological treatment provided partial relief. Surgical removal is considered due to potential growth and persistent symptoms, with risks of discomfort, infection, and prolonged healing. Benefits include improved hygiene and reduced itching. After shared decision-making, surgical removal is planned for early December. Meanwhile, he will use an over-the-counter ointment (Calmoseptine) for symptom management, applied multiple times daily. Post-operative care will include stool softeners and laxatives to prevent constipation and reduce discomfort during bowel movements. He is instructed to contact his cardiologist for clearance regarding blood thinners before surgery.   External hemorrhoids [K64.4]          Patient verbalized understanding, all questions were answered, and were agreeable with the plan outlined above.   Lucas Sjogren, MD  Electronically signed by Lucas Sjogren, MD

## 2023-12-16 DIAGNOSIS — Z961 Presence of intraocular lens: Secondary | ICD-10-CM | POA: Diagnosis not present

## 2023-12-16 DIAGNOSIS — H538 Other visual disturbances: Secondary | ICD-10-CM | POA: Diagnosis not present

## 2023-12-16 DIAGNOSIS — H25011 Cortical age-related cataract, right eye: Secondary | ICD-10-CM | POA: Diagnosis not present

## 2023-12-16 DIAGNOSIS — H25041 Posterior subcapsular polar age-related cataract, right eye: Secondary | ICD-10-CM | POA: Diagnosis not present

## 2023-12-16 DIAGNOSIS — H2511 Age-related nuclear cataract, right eye: Secondary | ICD-10-CM | POA: Diagnosis not present

## 2023-12-19 ENCOUNTER — Ambulatory Visit (INDEPENDENT_AMBULATORY_CARE_PROVIDER_SITE_OTHER): Admitting: Internal Medicine

## 2023-12-19 ENCOUNTER — Encounter: Payer: Self-pay | Admitting: Internal Medicine

## 2023-12-19 VITALS — BP 130/78 | Ht 71.0 in | Wt 173.0 lb

## 2023-12-19 DIAGNOSIS — Z0001 Encounter for general adult medical examination with abnormal findings: Secondary | ICD-10-CM

## 2023-12-19 DIAGNOSIS — E559 Vitamin D deficiency, unspecified: Secondary | ICD-10-CM

## 2023-12-19 DIAGNOSIS — Z125 Encounter for screening for malignant neoplasm of prostate: Secondary | ICD-10-CM | POA: Diagnosis not present

## 2023-12-19 DIAGNOSIS — E119 Type 2 diabetes mellitus without complications: Secondary | ICD-10-CM | POA: Diagnosis not present

## 2023-12-19 DIAGNOSIS — R202 Paresthesia of skin: Secondary | ICD-10-CM

## 2023-12-19 DIAGNOSIS — E538 Deficiency of other specified B group vitamins: Secondary | ICD-10-CM | POA: Diagnosis not present

## 2023-12-19 DIAGNOSIS — E785 Hyperlipidemia, unspecified: Secondary | ICD-10-CM | POA: Diagnosis not present

## 2023-12-19 NOTE — Patient Instructions (Signed)
 Health Maintenance, Male  Adopting a healthy lifestyle and getting preventive care are important in promoting health and wellness. Ask your health care provider about:  The right schedule for you to have regular tests and exams.  Things you can do on your own to prevent diseases and keep yourself healthy.  What should I know about diet, weight, and exercise?  Eat a healthy diet    Eat a diet that includes plenty of vegetables, fruits, low-fat dairy products, and lean protein.  Do not eat a lot of foods that are high in solid fats, added sugars, or sodium.  Maintain a healthy weight  Body mass index (BMI) is a measurement that can be used to identify possible weight problems. It estimates body fat based on height and weight. Your health care provider can help determine your BMI and help you achieve or maintain a healthy weight.  Get regular exercise  Get regular exercise. This is one of the most important things you can do for your health. Most adults should:  Exercise for at least 150 minutes each week. The exercise should increase your heart rate and make you sweat (moderate-intensity exercise).  Do strengthening exercises at least twice a week. This is in addition to the moderate-intensity exercise.  Spend less time sitting. Even light physical activity can be beneficial.  Watch cholesterol and blood lipids  Have your blood tested for lipids and cholesterol at 63 years of age, then have this test every 5 years.  You may need to have your cholesterol levels checked more often if:  Your lipid or cholesterol levels are high.  You are older than 63 years of age.  You are at high risk for heart disease.  What should I know about cancer screening?  Many types of cancers can be detected early and may often be prevented. Depending on your health history and family history, you may need to have cancer screening at various ages. This may include screening for:  Colorectal cancer.  Prostate cancer.  Skin cancer.  Lung  cancer.  What should I know about heart disease, diabetes, and high blood pressure?  Blood pressure and heart disease  High blood pressure causes heart disease and increases the risk of stroke. This is more likely to develop in people who have high blood pressure readings or are overweight.  Talk with your health care provider about your target blood pressure readings.  Have your blood pressure checked:  Every 3-5 years if you are 63-63 years of age.  Every year if you are 3 years old or older.  If you are between the ages of 60 and 72 and are a current or former smoker, ask your health care provider if you should have a one-time screening for abdominal aortic aneurysm (AAA).  Diabetes  Have regular diabetes screenings. This checks your fasting blood sugar level. Have the screening done:  Once every three years after age 63 if you are at a normal weight and have a low risk for diabetes.  More often and at a younger age if you are overweight or have a high risk for diabetes.  What should I know about preventing infection?  Hepatitis B  If you have a higher risk for hepatitis B, you should be screened for this virus. Talk with your health care provider to find out if you are at risk for hepatitis B infection.  Hepatitis C  Blood testing is recommended for:  Everyone born from [birth year] through 1965.  Anyone  with known risk factors for hepatitis C.  Sexually transmitted infections (STIs)  You should be screened each year for STIs, including gonorrhea and chlamydia, if:  You are sexually active and are younger than 63 years of age.  You are older than 63 years of age and your health care provider tells you that you are at risk for this type of infection.  Your sexual activity has changed since you were last screened, and you are at increased risk for chlamydia or gonorrhea. Ask your health care provider if you are at risk.  Ask your health care provider about whether you are at high risk for HIV. Your health care provider  may recommend a prescription medicine to help prevent HIV infection. If you choose to take medicine to prevent HIV, you should first get tested for HIV. You should then be tested every 3 months for as long as you are taking the medicine.  Follow these instructions at home:  Alcohol use  Do not drink alcohol if your health care provider tells you not to drink.  If you drink alcohol:  Limit how much you have to 0-2 drinks a day.  Know how much alcohol is in your drink. In the [place] one drink equals one 12 oz bottle of beer (355 mL), one 5 oz glass of wine (148 mL), or one 1 oz glass of hard liquor (44 mL).  Lifestyle  Do not use any products that contain nicotine or tobacco. These products include cigarettes, chewing tobacco, and vaping devices, such as e-cigarettes. If you need help quitting, ask your health care provider.  Do not use street drugs.  Do not share needles.  Ask your health care provider for help if you need support or information about quitting drugs.  General instructions  Schedule regular health, dental, and eye exams.  Stay current with your vaccines.  Tell your health care provider if:  You often feel depressed.  You have ever been abused or do not feel safe at home.  Summary  Adopting a healthy lifestyle and getting preventive care are important in promoting health and wellness.  Follow your health care provider's instructions about healthy diet, exercising, and getting tested or screened for diseases.  Follow your health care provider's instructions on monitoring your cholesterol and blood pressure.  This information is not intended to replace advice given to you by your health care provider. Make sure you discuss any questions you have with your health care provider.  Document Revised: 06/16/2020 Document Reviewed: 06/16/2020  Elsevier Patient Education  2024 [employer].

## 2023-12-19 NOTE — Progress Notes (Signed)
 Subjective:    Patient ID: Troy VEAR Jackson Mickey., male    DOB: Feb 05, 1961, 63 y.o.   MRN: [id number]  HPI  Patient presents to clinic today for his annual exam.  Flu: 10/2023 Tetanus: 02/2015 Prevnar 13: 04/2020 Pneumovax: 12/2018 Shingrix: 11/2022, 02/2023 Covid: x 4 PSA screening: 12/2022 Colon screening: 06/2020 Lung cancer screening: 01/2023 Vision screening: annually Dentist: as needed  Diet: He does eat meat. He consumes more veggies than fruits. He does eat some fried foods. He drinks mostly water . Exercise: None  Review of Systems     Past Medical History:  Diagnosis Date   Bronchitis    Gets bronchitis almost every year   CAD S/P percutaneous coronary angioplasty 06/2011; 6/ & 11/2013   S/P PCI to all 3 major vessels;a)  Ant STEMI 2001- BMS-LAD x2 -->(redo PCI 6/'15 -- pLAD Xience DES 2.5 x 12- 2.75 mm, mLAD 2.25 x 12 - 2.7 mm), b) '06 UA --> Cx- OM DES; c) 2013 Inf MI BMS mRCA --> d) 10/'15 PCI dRCA Promus P DES 4.0 x 16 (4.25 mm); PTCA of RPL2 (2.0 mm) &RPDA (2.25 mm) 11/2013; 4/18 PCI OM1 Synergy DES  2.5 x 16   COPD (chronic obstructive pulmonary disease) (HCC)    Elevated WBC count    Emphysema of lung (HCC)    Essential hypertension 07/07/2011   Former heavy tobacco smoker     quit in May 2015 after multiple attempts at trying to quit before    GERD (gastroesophageal reflux disease)    Glucose intolerance (pre-diabetes) June 2015    hemoglobin A1c 6.6   Headache    Imdur  related; stopped taking it; headaches went away (11/15/2013)   Heart murmur    History of colon polyps    History of stomach ulcers    Hyperlipidemia with target LDL less than 70 07/07/2011   Metabolic syndrome    Pre-diabetes, hypertension and truncal obesity as well as dyslipidemia   OSA on CPAP    Pneumonia several times   Recurrent upper respiratory infection (URI)    Seizures (HCC) several   last one was ~ 2011 (11/15/2013)   Shortness of breath dyspnea    ST elevation myocardial  infarction (STEMI) of anterior wall (HCC) 2001   History of -- 2 stents in early and distal mid LAD; prior cardiologist was Dr. Aloysius Cedar in [place]   ST elevation myocardial infarction (STEMI) of inferior wall (HCC) 07/07/2011   Occluded RCA 2.75X18 INTEGRITY; Echo 07/2013: EF 45-50%, Inferior & Posterolateral HK.    Umbilical hernia    Unstable angina (HCC) 2006; 07/2013   Cx-OM - PCI 2.5 mm 13 mm Cypher DES [place], [place]) ; 07/2013: Severe ISR of both prox & Distal LAD stentS --> 2 DES stents (1 at prox edge of the proximal stent, 2nd covers the entire distal stent as well as proximal and distal edge stenose)    Upper respiratory infection     Current Outpatient Medications  Medication Sig Dispense Refill   albuterol  (VENTOLIN  HFA) 108 (90 Base) MCG/ACT inhaler INHALE 2 PUFFS BY MOUTH EVERY 6 HOURS AS NEEDED FOR SHORTNESS OF BREATH 25.5 g 1   Blood Glucose Monitoring Suppl DEVI 1 each by Does not apply route in the morning, at noon, and at bedtime. May substitute to any manufacturer covered by patient's insurance. 1 each 0   Cholecalciferol 50 MCG (2000 UT) CAPS Take 2,000 Units by mouth.     clobetasol  cream (TEMOVATE ) 0.05 % Apply 1  Application topically daily as needed (eczema). 30 g 0   EFFIENT  10 MG TABS tablet Take 1 tablet (10 mg total) by mouth daily. 90 tablet 1   erythromycin  ophthalmic ointment Place 1 Application into the right eye at bedtime. (Patient not taking: Reported on 10/21/2023) 3.5 g 0   fenofibrate  (TRICOR ) 48 MG tablet TAKE 1 TABLET BY MOUTH DAILY 90 tablet 2   fluticasone  (FLONASE ) 50 MCG/ACT nasal spray Place 2 sprays into both nostrils daily. (Patient taking differently: Place 2 sprays into both nostrils daily. TAKES PRN) 16 g 0   fluticasone -salmeterol (ADVAIR) 100-50 MCG/ACT AEPB INHALE 1 PUFF BY MOUTH 2 TIMES A DAY - IN THE MORNING AND IN THE EVENING 180 each 1   gabapentin (NEURONTIN) 100 MG capsule Take 100 mg by mouth at bedtime.     ketoconazole   (NIZORAL ) 2 % shampoo Apply 1 Application topically 2 (two) times a week. 120 mL 0   Lancets (ONETOUCH DELICA PLUS LANCET33G) MISC USE TO TEST BLOOD SUGAR UP TO 4 TIMES DAILY 100 each 3   metoprolol  succinate (TOPROL -XL) 25 MG 24 hr tablet TAKE 1 AND 1/2 TABLET BY MOUTH EVERY EVENING 135 tablet 3   nitroGLYCERIN  (NITROSTAT ) 0.4 MG SL tablet Place 1 tablet (0.4 mg total) under the tongue every 5 (five) minutes as needed for chest pain. 25 tablet 11   Omega-3 Fatty Acids (FISH OIL ) 1000 MG CPDR Take 3 g by mouth daily. 30 capsule 11   omeprazole  (PRILOSEC) 40 MG capsule TAKE 1 CAPSULE BY MOUTH DAILY 90 capsule 1   ONETOUCH ULTRA test strip USE 1 STRIP TO TEST 4 TIMES A DAY 200 strip 1   rosuvastatin  (CRESTOR ) 40 MG tablet Take 1 tablet (40 mg total) by mouth daily. 90 tablet 3   sildenafil  (VIAGRA ) 50 MG tablet TAKE 1/2 TO 1 TABLET BY MOUTH DAILY AS NEEDED FOR ERECTILE DYSFUNCTION 10 tablet 6   sildenafil  (VIAGRA ) 50 MG tablet TAKE 1/2 TO 1 TABLET BY MOUTH AS NEEDED FOR ERECTILE DYSFUNCTION 10 tablet 6   No current facility-administered medications for this visit.    Allergies  Allergen Reactions   Niacin  And Related Hives and Itching    Family History  Problem Relation Age of Onset   Coronary artery disease Father    Heart disease Father    Stroke Father    Hypertension Father    Hyperlipidemia Mother    Diabetes Mother    Hyperlipidemia Maternal Grandmother    Diabetes Maternal Grandmother    Hyperlipidemia Maternal Grandfather    Diabetes Maternal Grandfather    Emphysema Maternal Grandfather    Heart disease Paternal Grandmother    Heart disease Paternal Grandfather    Cancer Maternal Aunt        breast    Social History   Socioeconomic History   Marital status: Married    Spouse name: Not on file   Number of children: 8   Years of education: Not on file   Highest education level: 12th grade  Occupational History   Not on file  Tobacco Use   Smoking status: Every  Day    Current packs/day: 2.25    Average packs/day: 2.3 packs/day for 48.9 years (109.9 ttl pk-yrs)    Types: Cigarettes    Start date: 9   Smokeless tobacco: Never   Tobacco comments:    Quit with Bupropion  150mg  - > unfortunately, he restarted; has cut all the way down to maybe 1 or 2 cigarettes a day,  some days he goes without any.-Some days even multiple days without cigarettes.    12/31/2022 patient smokes about 14 cigarettes daily  Vaping Use   Vaping status: Never Used  Substance and Sexual Activity   Alcohol use: Yes    Comment: rare   Drug use: No   Sexual activity: Not on file  Other Topics Concern   Not on file  Social History Narrative   He is a married father of 8, grandfather of 8. He does not really get routine exercise.    He is down to 1-2 cigarettes a day-sometimes going more than 2 to 3 days without 1.. He is very seriously wanting to quit.     He has a social alcoholic beverage every now and then.       He is no longer working, medically retired essentially.  His wife still works.  He and his wife enjoy traveling have done multiple trips and vacations over the last couple years including several trips to [place]  to be near her family.      Late summer 2023-2-week trip to [place]  [facility], and then drove up to the [employer] including seeing [place] and [place].  Lots of walking and hiking.   Social Drivers of Corporate Investment Banker Strain: Low Risk  (12/15/2023)   Overall Financial Resource Strain (CARDIA)    Difficulty of Paying Living Expenses: Not hard at all  Food Insecurity: No Food Insecurity (12/15/2023)   Hunger Vital Sign    Worried About Running Out of Food in the Last Year: Never true    Ran Out of Food in the Last Year: Never true  Transportation Needs: No Transportation Needs (12/15/2023)   PRAPARE - Administrator, Civil Service (Medical): No    Lack of Transportation (Non-Medical): No  Physical  Activity: Insufficiently Active (12/15/2023)   Exercise Vital Sign    Days of Exercise per Week: 1 day    Minutes of Exercise per Session: 10 min  Stress: No Stress Concern Present (12/15/2023)   [employer] of Occupational Health - Occupational Stress Questionnaire    Feeling of Stress: Not at all  Social Connections: Moderately Integrated (12/15/2023)   Social Connection and Isolation Panel    Frequency of Communication with Friends and Family: More than three times a week    Frequency of Social Gatherings with Friends and Family: More than three times a week    Attends Religious Services: 1 to 4 times per year    Active Member of [employer] or Organizations: No    Attends Banker Meetings: Not on file    Marital Status: Married  Catering Manager Violence: Not At Risk (10/21/2023)   Humiliation, Afraid, Rape, and Kick questionnaire    Fear of Current or Ex-Partner: No    Emotionally Abused: No    Physically Abused: No    Sexually Abused: No     Constitutional: Denies fever, malaise, fatigue, headache or abrupt weight changes.  HEENT: Denies eye pain, eye redness, ear pain, ringing in the ears, wax buildup, runny nose, nasal congestion, bloody nose, or sore throat. Respiratory: Denies difficulty breathing, shortness of breath, cough or sputum production.   Cardiovascular: Denies chest pain, chest tightness, palpitations or swelling in the hands or feet.  Gastrointestinal: Denies abdominal pain, bloating, constipation, diarrhea or blood in the stool.  GU: Patient reports erectile dysfunction.  Denies urgency, frequency, pain with urination, burning sensation, blood in urine, odor or  discharge. Musculoskeletal:  Denies decrease in range of motion, difficulty with gait, muscle pain or joint pain and swelling.  Skin: Denies redness, rashes, lesions or ulcercations.  Neurological: Pt reports paresthesia of left hand. Denies dizziness, difficulty with memory, difficulty with  speech or problems with balance and coordination.  Psych: Denies anxiety, depression, SI/HI.  No other specific complaints in a complete review of systems (except as listed in HPI above).  Objective:   Physical Exam    BP 130/78 (BP Location: Left Arm, Patient Position: Sitting, Cuff Size: Normal)   Ht 5' 11 (1.803 m)   Wt 173 lb (78.5 kg)   BMI 24.13 kg/m   Wt Readings from Last 3 Encounters:  10/05/23 174 lb (78.9 kg)  07/19/23 170 lb 12.8 oz (77.5 kg)  06/15/23 167 lb 6.4 oz (75.9 kg)    General: Appears his stated age, well developed, well nourished in NAD. Skin: Warm, dry and intact. No  ulcerations noted. HEENT: Head: normal shape and size; Eyes: sclera white, no icterus, conjunctiva pink, PERRLA and EOMs intact; Ears: Bilateral cerumen impaction Neck:  Neck supple, trachea midline. No masses, lumps or thyromegaly present.  Cardiovascular: Normal rate and rhythm. S1,S2 noted.  No murmur, rubs or gallops noted. No JVD or BLE edema. No carotid bruits noted. Pulmonary/Chest: Normal effort and positive vesicular breath sounds. No respiratory distress. No wheezes, rales or ronchi noted.  Abdomen: Soft and nontender. Normal bowel sounds.  Musculoskeletal: Strength 5/5 BUE/BLE.  No difficulty with gait.  Neurological: Alert and oriented. Cranial nerves II-XII grossly intact. Coordination normal.  Psychiatric: Mood and affect normal. Behavior is normal. Judgment and thought content normal.    BMET    Component Value Date/Time   NA 139 06/15/2023 0959   NA 139 11/15/2019 1010   NA 139 10/24/2013 1050   K 4.5 06/15/2023 0959   K 3.8 10/24/2013 1050   CL 106 06/15/2023 0959   CL 107 10/24/2013 1050   CO2 24 06/15/2023 0959   CO2 24 10/24/2013 1050   GLUCOSE 109 (H) 06/15/2023 0959   GLUCOSE 108 (H) 10/24/2013 1050   BUN 15 06/15/2023 0959   BUN 16 11/15/2019 1010   BUN 8 10/24/2013 1050   CREATININE 0.90 06/15/2023 0959   CALCIUM  9.9 06/15/2023 0959   CALCIUM  8.2 (L)  10/24/2013 1050   GFRNONAA 46 (L) 01/26/2022 2236   GFRNONAA >60 10/24/2013 1050   GFRAA 81 11/15/2019 1010   GFRAA >60 10/24/2013 1050    Lipid Panel     Component Value Date/Time   CHOL 127 06/15/2023 0959   CHOL 141 11/15/2019 1010   TRIG 145 06/15/2023 0959   HDL 42 06/15/2023 0959   HDL 32 (L) 11/15/2019 1010   CHOLHDL 3.0 06/15/2023 0959   VLDL 18.2 05/05/2020 1304   LDLCALC 62 06/15/2023 0959    CBC    Component Value Date/Time   WBC 13.1 (H) 06/15/2023 0959   RBC 5.69 06/15/2023 0959   HGB 16.8 06/15/2023 0959   HGB 17.7 11/15/2019 1010   HCT 51.5 (H) 06/15/2023 0959   HCT 51.9 (H) 11/15/2019 1010   PLT 391 06/15/2023 0959   PLT 391 11/15/2019 1010   MCV 90.5 06/15/2023 0959   MCV 88 11/15/2019 1010   MCV 89 10/24/2013 1050   MCH 29.5 06/15/2023 0959   MCHC 32.6 06/15/2023 0959   RDW 13.8 06/15/2023 0959   RDW 13.0 11/15/2019 1010   RDW 14.5 10/24/2013 1050   LYMPHSABS 4.6 (H)  04/04/2015 1040   LYMPHSABS 2.8 11/07/2013 1509   MONOABS 0.9 04/04/2015 1040   EOSABS 0.1 04/04/2015 1040   EOSABS 0.2 11/07/2013 1509   BASOSABS 0.1 04/04/2015 1040   BASOSABS 0.1 11/07/2013 1509    Hgb A1C Lab Results  Component Value Date   HGBA1C 6.2 (H) 06/15/2023          Assessment & Plan:   Preventative health maintenance:  Flu shot UTD Tetanus UTD Encouraged him to get his COVID booster Pneumovax and Prevnar UTD Shingrix UTD CT lung cancer screening ordered Colon screening UTD Encouraged him to consume a balanced diet and exercise regimen Advised him to see an eye doctor and dentist annually Will check CBC, c-Met, lipid, A1c and PSA today   Paresthesia of Left Hand:  Will check Vit d and B12 today  RTC in 6 months, follow-up chronic conditions Angeline Laura, NP

## 2023-12-20 ENCOUNTER — Ambulatory Visit: Payer: Self-pay | Admitting: Internal Medicine

## 2023-12-20 LAB — CBC
HCT: 53.3 % — ABNORMAL HIGH (ref 38.5–50.0)
Hemoglobin: 17.5 g/dL — ABNORMAL HIGH (ref 13.2–17.1)
MCH: 29.4 pg (ref 27.0–33.0)
MCHC: 32.8 g/dL (ref 32.0–36.0)
MCV: 89.6 fL (ref 80.0–100.0)
MPV: 9.8 fL (ref 7.5–12.5)
Platelets: 359 Thousand/uL (ref 140–400)
RBC: 5.95 Million/uL — ABNORMAL HIGH (ref 4.20–5.80)
RDW: 13.8 % (ref 11.0–15.0)
WBC: 11.2 Thousand/uL — ABNORMAL HIGH (ref 3.8–10.8)

## 2023-12-20 LAB — COMPREHENSIVE METABOLIC PANEL WITH GFR
AG Ratio: 1.5 (calc) (ref 1.0–2.5)
ALT: 20 U/L (ref 9–46)
AST: 21 U/L (ref 10–35)
Albumin: 4.1 g/dL (ref 3.6–5.1)
Alkaline phosphatase (APISO): 78 U/L (ref 35–144)
BUN: 11 mg/dL (ref 7–25)
CO2: 25 mmol/L (ref 20–32)
Calcium: 9.7 mg/dL (ref 8.6–10.3)
Chloride: 105 mmol/L (ref 98–110)
Creat: 0.93 mg/dL (ref 0.70–1.35)
Globulin: 2.8 g/dL (ref 1.9–3.7)
Glucose, Bld: 109 mg/dL — ABNORMAL HIGH (ref 65–99)
Potassium: 4.1 mmol/L (ref 3.5–5.3)
Sodium: 139 mmol/L (ref 135–146)
Total Bilirubin: 0.5 mg/dL (ref 0.2–1.2)
Total Protein: 6.9 g/dL (ref 6.1–8.1)
eGFR: 92 mL/min/1.73m2 (ref >=60)

## 2023-12-20 LAB — VITAMIN D 25 HYDROXY (VIT D DEFICIENCY, FRACTURES): Vit D, 25-Hydroxy: 29 ng/mL — ABNORMAL LOW (ref 30–100)

## 2023-12-20 LAB — PSA: PSA: 0.39 ng/mL (ref <=4.00)

## 2023-12-20 LAB — LIPID PANEL
Cholesterol: 117 mg/dL (ref <200)
HDL: 38 mg/dL — ABNORMAL LOW (ref >=40)
LDL Cholesterol (Calc): 58 mg/dL
Non-HDL Cholesterol (Calc): 79 mg/dL (ref <130)
Total CHOL/HDL Ratio: 3.1 (calc) (ref <5.0)
Triglycerides: 119 mg/dL (ref <150)

## 2023-12-20 LAB — HEMOGLOBIN A1C
Hgb A1c MFr Bld: 6.1 % — ABNORMAL HIGH (ref <5.7)
Mean Plasma Glucose: 128 mg/dL
eAG (mmol/L): 7.1 mmol/L

## 2023-12-20 LAB — VITAMIN B12: Vitamin B-12: 862 pg/mL (ref 200–1100)

## 2023-12-21 ENCOUNTER — Encounter: Payer: Self-pay | Admitting: Acute Care

## 2023-12-23 ENCOUNTER — Ambulatory Visit: Payer: Self-pay | Admitting: General Surgery

## 2023-12-29 ENCOUNTER — Encounter
Admission: RE | Admit: 2023-12-29 | Discharge: 2023-12-29 | Disposition: A | Discharge status: Home / Self Care | Source: Ambulatory Visit | Attending: General Surgery | Admitting: General Surgery

## 2023-12-29 ENCOUNTER — Telehealth (HOSPITAL_BASED_OUTPATIENT_CLINIC_OR_DEPARTMENT_OTHER): Payer: Self-pay | Admitting: *Deleted

## 2023-12-29 ENCOUNTER — Other Ambulatory Visit: Payer: Self-pay

## 2023-12-29 ENCOUNTER — Ambulatory Visit: Attending: Physician Assistant | Admitting: Physician Assistant

## 2023-12-29 DIAGNOSIS — Z01810 Encounter for preprocedural cardiovascular examination: Secondary | ICD-10-CM | POA: Diagnosis not present

## 2023-12-29 HISTORY — DX: Prediabetes: R73.03

## 2023-12-29 NOTE — Telephone Encounter (Signed)
-----   Message from Dorise CHARLENA Pereyra sent at 12/29/2023 12:16 PM EST ----- Regarding: Request for pre-operative cardiac clearance Request for pre-operative cardiac clearance:  1. What type of surgery is being performed?  HEMORRHOIDECTOMY  2. When is this surgery scheduled?  01/11/2024  3. Type of clearance being requested (medical, pharmacy, both)? BOTH   4. Are there any medications that need to be held prior to surgery? CLOPIDOGREL  5. Practice name and name of physician performing surgery?  Performing surgeon: Dr. Lucas Sjogren, MD Requesting clearance: Dorise Pereyra, FNP-C    6. Anesthesia type (none, local, MAC, general)? GENERAL  7. What is the office phone and fax number?   Phone: [phone] Fax: Return FAX not required. I will follow up in [facility].  ATTENTION: Unable to create telephone message as per your standard workflow. Directed by [facility] providers to send requests for cardiac clearance to this pool for appropriate distribution to provider covering pre-operative clearances.   Dorise Pereyra, MSN, APRN, FNP-C, CEN [facility]  Peri-operative Services Nurse Practitioner Phone: [phone] 12/29/23 12:16 PM

## 2023-12-29 NOTE — Telephone Encounter (Signed)
   Name: Troy Parrish.  DOB: 16-Feb-1960  MRN: [id number]  Primary Cardiologist: Alm Clay, MD  Chart reviewed as part of pre-operative protocol coverage. Because of Troy DANTES Jr.'s past medical history and time since last visit, he will require a follow-up telephone visit in order to better assess preoperative cardiovascular risk.  Pre-op  covering staff: - Please schedule appointment and call patient to inform them. If patient already had an upcoming appointment within acceptable timeframe, please add pre-op  clearance to the appointment notes so provider is aware. - Please contact requesting surgeon's office via preferred method (i.e, phone, fax) to inform them of need for appointment prior to surgery.  Per Dr. Genice note 10/2023 can hold Effient  for 7 days prior to surgery and resume when medically safe to do so.  Troy LOISE Fabry, PA-C  12/29/2023, 1:18 PM

## 2023-12-29 NOTE — Telephone Encounter (Signed)
 S/w the pt who is leaving Sunday for his son graduating from the [employer], in [place]. Pt states he will not be back until after Thanksgiving right before his procedure.   I added the pt on today at 3 pm and will d/w the preop APP as well. Med rec and consent are done.

## 2023-12-29 NOTE — Telephone Encounter (Signed)
 S/w the pt who is leaving Sunday for his son graduating from the [employer], in [place]. Pt states he will not be back until after Thanksgiving right before his procedure.   I added the pt on today at 3 pm and will d/w the preop APP as well. Med rec and consent are done.     Patient Consent for Virtual Visit        Troy Parrish. has provided verbal consent on 12/29/2023 for a virtual visit (video or telephone).   CONSENT FOR VIRTUAL VISIT FOR:  Troy Parrish.  By participating in this virtual visit I agree to the following:  I hereby voluntarily request, consent and authorize [facility] and its employed or contracted physicians, physician assistants, nurse practitioners or other licensed health care professionals (the Practitioner), to provide me with telemedicine health care services (the "Services) as deemed necessary by the treating Practitioner. I acknowledge and consent to receive the Services by the Practitioner via telemedicine. I understand that the telemedicine visit will involve communicating with the Practitioner through live audiovisual communication technology and the disclosure of certain medical information by electronic transmission. I acknowledge that I have been given the opportunity to request an in-person assessment or other available alternative prior to the telemedicine visit and am voluntarily participating in the telemedicine visit.  I understand that I have the right to withhold or withdraw my consent to the use of telemedicine in the course of my care at any time, without affecting my right to future care or treatment, and that the Practitioner or I may terminate the telemedicine visit at any time. I understand that I have the right to inspect all information obtained and/or recorded in the course of the telemedicine visit and may receive copies of available information for a reasonable fee.  I understand that some of the potential risks of receiving the  Services via telemedicine include:  Delay or interruption in medical evaluation due to technological equipment failure or disruption; Information transmitted may not be sufficient (e.g. poor resolution of images) to allow for appropriate medical decision making by the Practitioner; and/or  In rare instances, security protocols could fail, causing a breach of personal health information.  Furthermore, I acknowledge that it is my responsibility to provide information about my medical history, conditions and care that is complete and accurate to the best of my ability. I acknowledge that Practitioner's advice, recommendations, and/or decision may be based on factors not within their control, such as incomplete or inaccurate data provided by me or distortions of diagnostic images or specimens that may result from electronic transmissions. I understand that the practice of medicine is not an exact science and that Practitioner makes no warranties or guarantees regarding treatment outcomes. I acknowledge that a copy of this consent can be made available to me via my patient portal [facility] MyChart), or I can request a printed copy by calling the office of [facility].    I understand that my insurance will be billed for this visit.   I have read or had this consent read to me. I understand the contents of this consent, which adequately explains the benefits and risks of the Services being provided via telemedicine.  I have been provided ample opportunity to ask questions regarding this consent and the Services and have had my questions answered to my satisfaction. I give my informed consent for the services to be provided through the use of telemedicine in my medical  care

## 2023-12-29 NOTE — Progress Notes (Signed)
 Virtual Visit via Telephone Note   Because of Baxter Gonzalez. co-morbid illnesses, he is at least at moderate risk for complications without adequate follow up.  This format is felt to be most appropriate for this patient at this time.  Due to technical limitations with video connection web designer), today's appointment will be conducted as an audio only telehealth visit, and Troy Garcilazo. verbally agreed to proceed in this manner.   All issues noted in this document were discussed and addressed.  No physical exam could be performed with this format.  Evaluation Performed:  Preoperative cardiovascular risk assessment _____________   Date:  12/29/2023   Patient ID:  Troy VEAR Jackson Mickey., DOB October 05, 1960, MRN [id number] Patient Location:  Home Provider location:   Office  Primary Care Provider:  Antonette Angeline ORN, NP Primary Cardiologist:  Alm Clay, MD  Chief Complaint / Patient Profile   63 y.o. y/o male with a h/o COPD, CAD s/p PCI 2013 and 2015, CHF, HTN, former smoker, OSA who is pending hemorrhoidectomy and presents today for telephonic preoperative cardiovascular risk assessment.  History of Present Illness    Troy Vanderzee. is a 63 y.o. male who presents via audio/video conferencing for a telehealth visit today.  Pt was last seen in cardiology clinic on 10/05/23 by Dr. Clay.  At that time Troy Shakespeare. was doing well.  The patient is now pending procedure as outlined above. Since his last visit, he has been doing well from a cardiovascular standpoint.  He has not noticed any cardiac related symptoms such as shortness of breath, chest pain or discomfort, or swelling in his feet or ankles.  He does surpass 4 METS on the DASI.  He does a lot of walking on major trips out of town with his wife.  Effient  to be held for 7 days prior to surgery and restarted when medically safe to do so.  Past Medical History    Past Medical History:  Diagnosis Date   Allergy     Bronchitis    Gets bronchitis almost every year   CAD S/P percutaneous coronary angioplasty 06/2011; 6/ & 11/2013   S/P PCI to all 3 major vessels;a)  Ant STEMI 2001- BMS-LAD x2 -->(redo PCI 6/'15 -- pLAD Xience DES 2.5 x 12- 2.75 mm, mLAD 2.25 x 12 - 2.7 mm), b) '06 UA --> Cx- OM DES; c) 2013 Inf MI BMS mRCA --> d) 10/'15 PCI dRCA Promus P DES 4.0 x 16 (4.25 mm); PTCA of RPL2 (2.0 mm) &RPDA (2.25 mm) 11/2013; 4/18 PCI OM1 Synergy DES  2.5 x 16   CHF (congestive heart failure) (HCC)    COPD (chronic obstructive pulmonary disease) (HCC)    Elevated WBC count    Emphysema of lung (HCC)    Essential hypertension 07/07/2011   Former heavy tobacco smoker     quit in May 2015 after multiple attempts at trying to quit before    GERD (gastroesophageal reflux disease)    Glucose intolerance (pre-diabetes) 07/2013    hemoglobin A1c 6.6   Headache    Imdur  related; stopped taking it; headaches went away (11/15/2013)   Heart murmur    History of colon polyps    History of stomach ulcers    Hyperlipidemia with target LDL less than 70 07/07/2011   Metabolic syndrome    Pre-diabetes, hypertension and truncal obesity as well as dyslipidemia   OSA on CPAP    Pneumonia several times  Pre-diabetes    Recurrent upper respiratory infection (URI)    Seizures (HCC) several   last one was ~ 2011 (11/15/2013)   Shortness of breath dyspnea    Sleep apnea    ST elevation myocardial infarction (STEMI) of anterior wall (HCC) 2001   History of -- 2 stents in early and distal mid LAD; prior cardiologist was Dr. Aloysius Cedar in [place]   ST elevation myocardial infarction (STEMI) of inferior wall (HCC) 07/07/2011   Occluded RCA 2.75X18 INTEGRITY; Echo 07/2013: EF 45-50%, Inferior & Posterolateral HK.    Ulcer    Umbilical hernia    Unstable angina (HCC) 2006; 07/2013   Cx-OM - PCI 2.5 mm 13 mm Cypher DES [place], [place]) ; 07/2013: Severe ISR of both prox & Distal LAD stentS --> 2 DES stents (1 at prox  edge of the proximal stent, 2nd covers the entire distal stent as well as proximal and distal edge stenose)    Upper respiratory infection    Past Surgical History:  Procedure Laterality Date   BIOPSY N/A 06/30/2020   Procedure: BIOPSY;  Surgeon: Jinny Carmine, MD;  Location: [facility];  Service: Endoscopy;  Laterality: N/A;   Cardiac MRI [facility]  09/2014   EF 51%. Mod HK of basal-mid Inf wall, mild HK of basal inferoseptum & basal-mid inferolateral wall with hyper enhancement (c/w subendocard RCA MI with significant viability), focal hyper enhancement of apical wall c/w dLAD subendocard MI & complete LAD viability. . No aortic stenosis. Normal RV function. No Ischemia on Adenosine  Stress.   CATARACT EXTRACTION W/PHACO Left 11/19/2021   Procedure: CATARACT EXTRACTION PHACO AND INTRAOCULAR LENS PLACEMENT (IOC) LEFT vivity lens;  Surgeon: Enola Feliciano Hugger, MD;  Location: [facility];  Service: Ophthalmology;  Laterality: Left;  11.32 1:32.6   COLONOSCOPY WITH PROPOFOL  N/A 03/28/2015   Procedure: COLONOSCOPY WITH PROPOFOL ;  Surgeon: Carmine Jinny, MD;  Location: [facility];  Service: Endoscopy;  Laterality: N/A;   COLONOSCOPY WITH PROPOFOL  N/A 06/30/2020   Procedure: COLONOSCOPY WITH PROPOFOL ;  Surgeon: Jinny Carmine, MD;  Location: [facility];  Service: Endoscopy;  Laterality: N/A;   CORONARY ANGIOPLASTY WITH STENT PLACEMENT  2001   ANTERIOR mi with a  PCI to LAD; [place], [place] - Dr. Woodie   CORONARY ANGIOPLASTY WITH STENT PLACEMENT  2006   [place] by Dr Woodie -lesion lft circ/OM1 with 2.5 x 13mm Cypher DES   CORONARY STENT INTERVENTION N/A 05/19/2016   Procedure: Coronary Stent Intervention;  Surgeon: Alm LELON Clay, MD;  Location: [facility] INVASIVE CV LAB;  Service: Cardiovascular: PCI pOM1 - Synergy DES 2.5 x 16 (overlaps old stent)   ESOPHAGOGASTRODUODENOSCOPY (EGD) WITH PROPOFOL  N/A 03/28/2015   Procedure: ESOPHAGOGASTRODUODENOSCOPY (EGD) WITH PROPOFOL ;   Surgeon: Carmine Jinny, MD;  Location: [facility];  Service: Endoscopy;  Laterality: N/A;   ESOPHAGOGASTRODUODENOSCOPY (EGD) WITH PROPOFOL  N/A 06/30/2020   Procedure: ESOPHAGOGASTRODUODENOSCOPY (EGD) WITH PROPOFOL ;  Surgeon: Jinny Carmine, MD;  Location: [facility];  Service: Endoscopy;  Laterality: N/A;   ESOPHAGOGASTRODUODENOSCOPY (EGD) WITH PROPOFOL  N/A 09/11/2020   Procedure: ESOPHAGOGASTRODUODENOSCOPY (EGD) WITH PROPOFOL ;  Surgeon: Jinny Carmine, MD;  Location: [facility];  Service: Endoscopy;  Laterality: N/A;   HERNIA REPAIR     INSERTION OF MESH N/A 04/02/2015   Procedure: INSERTION OF MESH;  Surgeon: Charlie FORBES Fell, MD;  Location: [facility] ORS;  Service: General;  Laterality: N/A;   LEFT AND RIGHT HEART CATHETERIZATION WITH CORONARY ANGIOGRAM N/A 07/30/2013   Procedure: LEFT AND RIGHT HEART  CATHETERIZATION WITH CORONARY ANGIOGRAM;  Surgeon: Alm LELON Clay, MD;  Location: [facility] CATH LAB;  Service: Cardiovascular;  2 separate P & mLAD lesions -> PCI, MOd-Severe dRCA disesase with diffuse RPL/PDA disease (FFR)   LEFT HEART CATH  08/03/2013   Procedure: LEFT HEART CATH;  Surgeon: Alm LELON Clay, MD;  Location: [facility] CATH LAB;  Service: Cardiovascular;;FFR of RCA - non-flow-limiting   LEFT HEART CATH AND CORONARY ANGIOGRAPHY N/A 05/19/2016   Procedure: Left Heart Cath and Coronary Angiography;  Surgeon: Alm LELON Clay, MD;  Location: [facility] INVASIVE CV LAB;  Service: Cardiovascular: CULPRIT - 85% pOM1 pre-stent. Stents in both proximal and distal RCA widely patent. PTCA sites in RPL & PDA widely patent with stable 70% small RPL 2. Extensive stenting in the LAD widely patent stents with mild to mod Dz btw prox & mid-distal stents.    LEFT HEART CATH AND CORONARY ANGIOGRAPHY N/A 02/13/2018   Procedure: LEFT HEART CATH AND CORONARY ANGIOGRAPHY;  Surgeon: Clay Alm LELON, MD;  Location: [facility] INVASIVE CV LAB;  Service: Cardiovascular: Similar findings to last cath post PCI.  Stents are  patent.  Roughly 50% proximal LAD lesion noted, may be progression from prior cath but otherwise stable disease.   LEFT HEART CATHETERIZATION WITH CORONARY ANGIOGRAM N/A 07/07/2011   Procedure: LEFT HEART CATHETERIZATION WITH CORONARY ANGIOGRAM;  Surgeon: Alm LELON Clay, MD;  Location: [facility] CATH LAB;  Service: Cardiovascular;  inferolat post LV infarct ST-elevation MI   MET/CPET  11/09/2011   submax. effort 1.05 RER peak V02 was 51% ,chronotropic incomp.hrt rate lows 80s to 100   MET/CPET  10/2014   [facility]: Mild functional impairment due primarily to mild pulmonary and circulatory limitations. Also suggest physical deconditioning (seen with low-normal aerobic reserve). Mild VQ mismatch with exercise.  Ventilatory reserve exhausted with peak exercise demonstrated pulmonary limitation. No significant decrease in postexercise FEV1 compared to rest --> suggest no exercise induced bronchospasm   NM MYOVIEW  ([facility] HX)  11/30/2011   EF 45% ,exercise 10 METS, infarct\scar w mild perinfarct ischemia -basal inferolat and mid inferolat region   NM MYOVIEW  LTD  04/25/2016   Exercised for 9 minutes. Reached 90% max. Heart rate. 9.4 METS. Read as a low risk study with normal EF 55-65%. -> on Review -  there does appear to be a small to moderate sized, medium severtiy reversible perfusion defect in the anterior wall - read by computer, but not noted by reader.   PERCUTANEOUS CORONARY STENT INTERVENTION (PCI-S)  07/07/2011   Procedure: PERCUTANEOUS CORONARY STENT INTERVENTION (PCI-S);  Surgeon: Alm LELON Clay, MD;  Location: [facility] CATH LAB;  Service: Cardiovascular;;occluded RCA ,Integrity Resolute DES 2.75 x 18 mm stent - post-dilated 3.1 mm   PERCUTANEOUS CORONARY STENT INTERVENTION (PCI-S)  07/30/2013   Procedure: PERCUTANEOUS CORONARY STENT INTERVENTION (PCI-S);  Surgeon: Alm LELON Clay, MD;  Location: [facility] C[facility] LAB;  Service: Cardiovascular;;Distal mid LAD: Xience Alpine DES 2.25 mm x 28 mm (overlapping proximal and  distal edge of previous stent) - 2.7 mm; proximal LAD 2.5 mm x 12 mm Xience Alpine DES (2.8 mm);; RCA 60-70% stenosis planned staged procedure   PERCUTANEOUS CORONARY STENT INTERVENTION (PCI-S) N/A 11/15/2013   Procedure: PERCUTANEOUS CORONARY STENT INTERVENTION (PCI-S);  Surgeon: Alm LELON Clay, MD;  Location: [facility] CATH LAB;  Service: Cardiovascular;  Patent Cx & LAD Stents; PCI -dRCA Promus Premier DES 4.0 mm x 16 mm (4.25 mm) dRCA, 2.0 mm Cutting PTCA of RPL2 Ostium & POBA of mRPDA   POLYPECTOMY  03/28/2015  Procedure: POLYPECTOMY;  Surgeon: Rogelia Copping, MD;  Location: [facility];  Service: Endoscopy;;   POLYPECTOMY N/A 06/30/2020   Procedure: POLYPECTOMY;  Surgeon: Copping Rogelia, MD;  Location: [facility];  Service: Endoscopy;  Laterality: N/A;   RIGHT HEART CATH  07/2013   Normal pressures with severely reduced cardiac output and index. (3.25/1.55)   TRANSTHORACIC ECHOCARDIOGRAM  07/31/2013; February 2017   a. LVEF 45-50. Inferior posterior hypokinesis with mild dilation. Apparent normal diastolic pressures;    UMBILICAL HERNIA REPAIR N/A 04/02/2015   Procedure: HERNIA REPAIR UMBILICAL ADULT;  Surgeon: Charlie FORBES Fell, MD;  Location: [facility] ORS;  Service: General;  Laterality: N/A;    Allergies  Allergies  Allergen Reactions   Niacin  And Related Hives and Itching    Home Medications    Prior to Admission medications   Medication Sig Start Date End Date Taking? Authorizing Provider  albuterol  (VENTOLIN  HFA) 108 (90 Base) MCG/ACT inhaler INHALE 2 PUFFS BY MOUTH EVERY 6 HOURS AS NEEDED FOR SHORTNESS OF BREATH 10/21/23   Antonette Angeline ORN, NP  Blood Glucose Monitoring Suppl DEVI 1 each by Does not apply route in the morning, at noon, and at bedtime. May substitute to any manufacturer covered by patient's insurance. 06/15/23   Antonette Angeline ORN, NP  Cholecalciferol 50 MCG (2000 UT) CAPS Take 4,000 Units by mouth.    [provider]  clobetasol  cream (TEMOVATE ) 0.05 %  Apply 1 Application topically daily as needed (eczema). 01/26/22   Antonette Angeline ORN, NP  clotrimazole (LOTRIMIN) 1 % cream Apply 1 Application topically 2 (two) times daily as needed.    [provider]  EFFIENT  10 MG TABS tablet Take 1 tablet (10 mg total) by mouth daily. 08/22/23   Anner Alm ORN, MD  fenofibrate  (TRICOR ) 48 MG tablet TAKE 1 TABLET BY MOUTH DAILY 04/14/23   Anner Alm ORN, MD  fluticasone  (FLONASE ) 50 MCG/ACT nasal spray Place 2 sprays into both nostrils daily. Patient taking differently: Place 2 sprays into both nostrils as needed for allergies. TAKES PRN 05/15/17 12/29/23  Saunders Givens L, PA-C  fluticasone -salmeterol (ADVAIR) 100-50 MCG/ACT AEPB INHALE 1 PUFF BY MOUTH 2 TIMES A DAY - IN THE MORNING AND IN THE EVENING 07/11/23   Antonette Angeline ORN, NP  gabapentin (NEURONTIN) 100 MG capsule Take 100 mg by mouth at bedtime. 11/02/22   [provider]  hydrocortisone 2.5 % cream Apply 1 Application topically 2 (two) times daily as needed.    [provider]  ketoconazole  (NIZORAL ) 2 % shampoo Apply 1 Application topically 2 (two) times a week. Patient not taking: Reported on 12/29/2023 01/28/22   Antonette Angeline ORN, NP  Lancets [facility] DELICA PLUS [place]) MISC USE TO TEST BLOOD SUGAR UP TO 4 TIMES DAILY 04/24/21   Antonette Angeline ORN, NP  metoprolol  succinate (TOPROL -XL) 25 MG 24 hr tablet TAKE 1 AND 1/2 TABLET BY MOUTH EVERY EVENING Patient taking differently: Take 25 mg by mouth every evening. 01/20/23   Anner Alm ORN, MD  nitroGLYCERIN  (NITROSTAT ) 0.4 MG SL tablet Place 1 tablet (0.4 mg total) under the tongue every 5 (five) minutes as needed for chest pain. 05/08/21   Anner Alm ORN, MD  Omega-3 Fatty Acids (FISH OIL  PO) Take 1,200 mg by mouth daily at 12 noon.    [provider]  omeprazole  (PRILOSEC) 40 MG capsule TAKE 1 CAPSULE BY MOUTH DAILY 07/11/23   Anner Alm ORN, MD  [facility] ULTRA test strip USE 1 STRIP TO TEST 4  TIMES A DAY 06/15/22    Antonette Angeline ORN, NP  rosuvastatin  (CRESTOR ) 40 MG tablet Take 1 tablet (40 mg total) by mouth daily. 02/28/23   Anner Alm ORN, MD  sildenafil  (VIAGRA ) 50 MG tablet TAKE 1/2 TO 1 TABLET BY MOUTH DAILY AS NEEDED FOR ERECTILE DYSFUNCTION 10/24/23   Anner Alm ORN, MD  vitamin B-12 (CYANOCOBALAMIN ) 500 MCG tablet Take 500 mcg by mouth daily.    [provider]    Physical Exam    Vital Signs:  Troy Omdahl. does not have vital signs available for review today.  Given telephonic nature of communication, physical exam is limited. AAOx3. NAD. Normal affect.  Speech and respirations are unlabored.  Accessory Clinical Findings    None  Assessment & Plan    1.  Preoperative Cardiovascular Risk Assessment:  Mr. Silverman perioperative risk of a major cardiac event is 0.9% according to the Revised Cardiac Risk Index (RCRI).  Therefore, he is at low risk for perioperative complications.   His functional capacity is good at 6.05 METs according to the [place] Activity Status Index (DASI). Recommendations: According to ACC/AHA guidelines, no further cardiovascular testing needed.  The patient may proceed to surgery at acceptable risk.   Antiplatelet and/or Anticoagulation Recommendations: Prasugrel  (Effient ) can be held for 7 days prior to his surgery and resumed as soon as possible post op.   The patient was advised that if he develops new symptoms prior to surgery to contact our office to arrange for a follow-up visit, and he verbalized understanding.  A copy of this note will be routed to requesting surgeon.  Time:   Today, I have spent 10 minutes with the patient with telehealth technology discussing medical history, symptoms, and management plan.     Troy LOISE Fabry, PA-C  12/29/2023, 3:03 PM

## 2023-12-29 NOTE — Telephone Encounter (Signed)
   Pre-operative Risk Assessment    Patient Name: Troy Parrish.  DOB: Sep 02, 1960 MRN: [id number]   Date of last office visit: 10/05/23 DR. HARDING Date of next office visit: NONE   Request for Surgical Clearance    Procedure:  HEMORRHOIDECTOMY   Date of Surgery:  Clearance 01/11/24                                Surgeon:  DR.CINTRON-DIAZ Surgeon's Group or Practice Name:  [facility]  Phone number:  [phone] DORISE PEREYRA, FNP Fax number:  Return FAX not required. I will follow up in [facility]    Type of Clearance Requested:   - Medical  - Pharmacy:  Hold Clopidogrel (Plavix)     Type of Anesthesia:  General    Additional requests/questions:    Bonney Niels Jest   12/29/2023, 12:53 PM

## 2023-12-29 NOTE — Patient Instructions (Addendum)
 Your procedure is scheduled on: 01/11/24 [facility] Report to the Registration Desk on the 1st floor of the Medical Mall. To find out your arrival time, please call [phone] between 1PM - 3PM on: 01/10/24 - TUESDAY If your arrival time is 6:00 am, do not arrive before that time as the Medical Mall entrance doors do not open until 6:00 am.  REMEMBER: Instructions that are not followed completely may result in serious medical risk, up to and including death; or upon the discretion of your surgeon and anesthesiologist your surgery may need to be rescheduled.  Do not eat food OR DRINK ANY LIQUIDS after midnight the night before surgery.  No gum chewing or hard candies.  One week prior to surgery: Stop Anti-inflammatories (NSAIDS) such as Advil, Aleve, Ibuprofen, Motrin, Naproxen, Naprosyn and Aspirin  based products such as Excedrin, Goody's Powder, BC Powder. You may take Tylenol  if needed for pain up until the day of surgery.  Stop ANY OVER THE COUNTER supplements until after surgery- Omega-3 Fatty Acids ,Cholecalciferol .  HOLD sildenafil  (VIAGRA ) 2 DAYS PRIOR TO SURGERY.  HOLD EFFIENT   7  DAYS PRIOR TO YOUR SURGERY, TAKE LAST DOSE ON 11/25.  ON THE DAY OF SURGERY ONLY TAKE THESE MEDICATIONS WITH SIPS OF WATER :  fluticasone -salmeterol (ADVAIR)   Use inhalers on the day of surgery and bring to the hospital.   No Alcohol for 24 hours before or after surgery.  No Smoking including e-cigarettes for 24 hours before surgery.  No chewable tobacco products for at least 6 hours before surgery.  No nicotine patches on the day of surgery.  Do not use any recreational drugs for at least a week (preferably 2 weeks) before your surgery.  Please be advised that the combination of cocaine and anesthesia may have negative outcomes, up to and including death. If you test positive for cocaine, your surgery will be cancelled.  On the morning of surgery brush your teeth with toothpaste and  water , you may rinse your mouth with mouthwash if you wish. Do not swallow any toothpaste or mouthwash.  Do not wear jewelry, make-up, hairpins, clips or nail polish.  For welded (permanent) jewelry: bracelets, anklets, waist bands, etc.  Please have this removed prior to surgery.  If it is not removed, there is a chance that hospital personnel will need to cut it off on the day of surgery.  Do not wear lotions, powders, or perfumes.   Do not shave body hair from the neck down 48 hours before surgery.  Contact lenses, hearing aids and dentures may not be worn into surgery.  Do not bring valuables to the hospital. [facility] is not responsible for any missing/lost belongings or valuables.   Notify your doctor if there is any change in your medical condition (cold, fever, infection).  Wear comfortable clothing (specific to your surgery type) to the hospital.  After surgery, you can help prevent lung complications by doing breathing exercises.  Take deep breaths and cough every 1-2 hours. Your doctor may order a device called an Incentive Spirometer to help you take deep breaths.  If you are being admitted to the hospital overnight, leave your suitcase in the car. After surgery it may be brought to your room.  In case of increased patient census, it may be necessary for you, the patient, to continue your postoperative care in the Same Day Surgery department.  If you are being discharged the day of surgery, you will not be allowed to drive home.  You will need a responsible individual to drive you home and stay with you for 24 hours after surgery.   If you are taking public transportation, you will need to have a responsible individual with you.  Please call the Pre-admissions Testing Dept. at [phone] if you have any questions about these instructions.  Surgery Visitation Policy:  Patients having surgery or a procedure may have two visitors.  Children under the age of 37 must  have an adult with them who is not the patient.  Inpatient Visitation:    Visiting hours are 7 a.m. to 8 p.m. Up to four visitors are allowed at one time in a patient room. The visitors may rotate out with other people during the day.  One visitor age 61 or older may stay with the patient overnight and must be in the room by 8 p.m.   Merchandiser, Retail to address health-related social needs:  [email or web address]

## 2024-01-03 ENCOUNTER — Other Ambulatory Visit: Payer: Self-pay | Admitting: Cardiology

## 2024-01-04 ENCOUNTER — Other Ambulatory Visit: Payer: Self-pay | Admitting: Internal Medicine

## 2024-01-04 ENCOUNTER — Other Ambulatory Visit: Payer: Self-pay

## 2024-01-04 ENCOUNTER — Encounter: Payer: Self-pay | Admitting: General Surgery

## 2024-01-04 ENCOUNTER — Other Ambulatory Visit: Payer: Self-pay | Admitting: Cardiology

## 2024-01-04 DIAGNOSIS — I5042 Chronic combined systolic (congestive) and diastolic (congestive) heart failure: Secondary | ICD-10-CM

## 2024-01-04 MED ORDER — FLUTICASONE-SALMETEROL 100-50 MCG/ACT IN AEPB: INHALATION_SPRAY | RESPIRATORY_TRACT | 1 refills | Status: AC

## 2024-01-04 NOTE — Progress Notes (Signed)
 Perioperative / Anesthesia Services  Pre-Admission Testing Clinical Review / Pre-Operative Anesthesia Consult  Date: 01/10/24  PATIENT DEMOGRAPHICS: Name: Troy Parrish. DOB: November 30, 1960 MRN:   [id number]  Note: Available PAT nursing documentation and vital signs have been reviewed. Clinical nursing staff has updated patient's PMH/PSHx, current medication list, and drug allergies/intolerances to ensure complete and comprehensive history available to assist care teams in MDM as it pertains to the aforementioned surgical procedure and anticipated anesthetic course. Extensive review of available clinical information personally performed. Nursing documentation reviewed. [place] PMH and PSHx updated with any diagnoses and/or procedures that I have knowledge of that may have been inadvertently omitted during his intake with the pre-admission testing department's nursing staff.  PLANNED SURGICAL PROCEDURE(S):   Case: [id number] Date/Time: 01/11/24 1150   Procedure: HEMORRHOIDECTOMY (Rectum)   Anesthesia type: General   Pre-op  diagnosis: K64.4 external hemorrhoids   Location: [facility] OR ROOM 04 / [facility]S FOR AN[facility]   Surgeons: Rodolph Romano, MD        CLINICAL DISCUSSION: Troy Parrish. is a 63 y.o. male who is submitted for pre-surgical anesthesia review and clearance prior to him undergoing the above procedure. Patient is a Current Smoker (110 pack years). Pertinent PMH includes: CAD, STEMI x 2, ischemic cardiomyopathy/HFpEF, cardiac murmur, BILATERAL carotid artery disease, aortic atherosclerosis, unstable angina, HTN, HLD, T2DM, SOB, COPD, OSAH (on nocturnal PAP therapy), GERD (on daily PPI), PUD, ED (on PDE5i), seizures, umbilical hernia, hemorrhoids.   Patient is followed by cardiology Davina, MD). He was last seen in the cardiology clinic on 10/05/2023; notes reviewed. At the time of his clinic visit, patient doing well overall from a cardiovascular perspective.  Patient complaining of infrequent episodes of shortness of breath, however noted that his respiratory status was stable and at baseline.  Patient denied any chest pain, shortness of breath, PND, orthopnea, palpitations, significant peripheral edema, weakness, fatigue, vertiginous symptoms, or presyncope/syncope. Patient with a past medical history significant for cardiovascular diagnoses. Documented physical exam was grossly benign, providing no evidence of acute exacerbation and/or decompensation of the patient's known cardiovascular conditions. Of note, complete records regarding patient's cardiovascular history unavailable for review at time of consult.  Patient has received care in [place], [place].  Information gathered from patient report, available out-of-state documentation, and notes provided by his local specialty care teams.   Patient reported to have suffered an anterior STEMI back in 2001.  PCI was performed placing BMS stents (unknown type) x 2 to the LAD.  Due to episodes of unstable angina, patient underwent repeat diagnostic LEFT heart catheterization in 10/2004.  There was severe ISR of the previously placed proximal and distal LAD stents noted.  Patiently, there was significant stenosis of the PCI was subsequently performed placing a 2.5 x 12 mm Xience DES x 1 to the proximal LAD and a 2.25 x 12 mm Xience DES x 1 to the mid LAD.  Procedure yielded excellent angiographic result and TIMI-3 flow.  Again, patient with recurrent episodes of and a stable angina.  He underwent repeat diagnostic LEFT heart catheterization revealing stenosis of the LCx and OM1.  Interventional cardiology made the decision to proceed with PCI of OM1 placing a 2.5 x 13 mm Cypher DES x 1.  Procedure yielded excellent angiographic result and TIMI-3 flow.  Patient suffered an inferolateral STEMI on 07/07/2011.  Diagnostic LEFT heart catheterization revealed an occluded RCA.  PCI was subsequently performed placing a 2.75  x 18 mm Resolute Integrity DES x 1  to the RCA.  Procedure yielded excellent angiographic result and TIMI-3 flow.  Repeat diagnostic LEFT heart catheterization was performed on 07/30/2013.  Study revealed multivessel CAD: 90% proximal LAD followed by 70 and 90% stenoses involving the previously placed distal-mid LAD stent.  PCI was performed placing a 2.25 x 12 mm Xience Alpine DES to the proximal LAD and a 2.25 x 28 mm Xience Alpine DES covering the entire distal LAD stent as well as proximal and distal edge of the stenosis.  Procedure yielded excellent angiographic result and TIMI-3 flow.  Repeat diagnostic LEFT heart catheterization was performed on 11/15/2013 revealing 90% stenosis of the ostial RPL2, 80% mid RPDA, and 70-80% distal RCA.  Cutting Balloon PTCA was performed to the RPL2 lesion, followed by POBA of the mid RPDA, and finally stent placement of a 4.0 x 16 mm Promus Premier DES x 1 to the distal RCA.  Procedure yielded excellent angiographic result and TIMI-3 flow.  Most recent TTE performed on 03/26/2015 revealed a low normal left ventricular systolic function with an EF of 50-55%.  There was posterior lateral hypokinesis observed.Left ventricular diastolic Doppler parameters consistent with abnormal relaxation (G1DD). Right ventricular size and function normal with a TAPSE measuring 2.1 cm  (normal range >/= 1.6 cm).  There was trivial tricuspid valve regurgitation.  All transvalvular gradients were noted to be normal providing no evidence of hemodynamically significant valvular stenosis. Aorta normal in size with no evidence of ectasia or aneurysmal dilatation.  Most recent myocardial perfusion imaging study was performed on 04/27/2016 revealing a normal left ventricular systolic function with an EF of 57%.  There were no regional wall motion abnormalities.  No artifact or left ventricular cavity size enlargement appreciated on review of imaging. SPECT images demonstrated no evidence of  stress-induced myocardial ischemia or arrhythmia; no scintigraphic evidence of scar.  TID ratio = 0.92 (normal range </= 1.2). Study determined to be normal and low risk.  Repeat diagnostic LEFT heart catheterization was performed on 05/19/2016.  Study demonstrated an 85% stenosis of the OM1-2.  PCI was subsequently performed placing a 2.5 x 16 mm Synergy DES x 1 to the OM1-2 lesion.  Procedure yielded excellent angiographic result and TIMI-3 flow.  Long-term cardiac event monitor study performed on 11/25/2016 revealing a baseline sinus tachycardia at an average rate of 102 bpm; range 60-148 bpm.  There were 12 patient triggered events, 6 of which were associated with sinus tachycardia.  There were no sustained arrhythmias or prolonged pauses.  Most recent diagnostic LEFT heart catheterization was performed on 02/13/2018 revealing multivessel CAD; 50% ostial-proximal LAD, 20% proximal LCx, 20% ISR previous RCA, 30% ostial RPDA-RPDA at site of previous PTCA, 10% posterior atrioventricular at PTCA site, and 50% RPLB-2.  No flow-limiting stenosis was noted.  The decision was made to defer further intervention at that time opting for aggressive secondary prevention through medical management.  Following multiple PCI procedures requiring stent placement, patient remains on daily antithrombotic therapy using prasugrel .  Patient reportedly compliant with therapy with no evidence or reports of GI/GU related bleeding.  Blood pressure reasonably controlled at 138/78 mmHg on currently prescribed beta-blocker (metoprolol  succinate) monotherapy. Patient is on fenofibrate  + omega-3 fatty acid + rosuvastatin  for his HLD diagnosis and ASCVD prevention.  Patient has a supply of short acting nitrates (NTG) to use on an as needed basis for recurrent angina/anginal equivalent symptoms; denied recent use. In the setting of known cardiovascular diagnoses, it is important note that patient is on a PDE5i medication (  sildenafil ) for  an erectile dysfunction diagnosis.  Hemoglobin A1c at the time of his visit with cardiology demonstrated good glycemic control with a result of 6.2% when checked on 06/15/2023.  Of note, hemoglobin A1c has been rechecked since patient was last seen by cardiology with further improvement down to 6.1% when checked on 12/19/2023.  Patient does have an OSAH diagnosis and is reported to be compliant with prescribed nocturnal PAP therapy. Patient is able to complete all of his  ADL/IADLs without cardiovascular limitation.  Per the DASI, patient is able to achieve at least 4 METS of physical activity without experiencing any significant degree of angina/anginal equivalent symptoms. No changes were made to his medication regimen during his visit with cardiology.  Patient scheduled to follow-up with outpatient cardiology in 6 months or sooner if needed.  [name]. is scheduled for an elective HEMORRHOIDECTOMY (Rectum) on 01/11/2024 with Dr. Lucas Sjogren, MD. Given patient's past medical history significant for cardiovascular diagnoses, presurgical cardiac clearance was sought by the PAT team. Per cardiology, Mr. Litsey perioperative risk of a major cardiac event is 0.9% according to the Revised Cardiac Risk Index (RCRI).  Therefore, he is at low risk for perioperative complications.   His functional capacity is good at 6.05 METs according to the [place] Activity Status Index (DASI). According to ACC/AHA guidelines, no further cardiovascular testing needed.  The patient may proceed to surgery at ACCEPTABLE risk.  Again, this patient is on daily antithrombotic therapy.  He has been instructed on recommendations for holding his prasugrel  for 7 days prior to his procedure with plans to restart as soon as postoperatively respectively minimized by his primary attending surgeon.  The patient is aware that his last dose of prasugrel  should be on 01/03/2024.  Patient denies previous perioperative complications  with anesthesia in the past. In review his EMR, it is noted that patient underwent a MAC anesthetic course at [facility] (ASA III) in 11/2021 without documented complications.   MOST RECENT VITAL SIGNS:    12/19/2023    9:23 AM 10/05/2023    3:01 PM 10/05/2023    2:29 PM  Vitals with BMI  Height 5' 11  5' 11  Weight 173 lbs  174 lbs  BMI 24.14  24.28  Systolic 130 138 857  Diastolic 78 78 80  Pulse   95   PROVIDERS/SPECIALISTS: NOTE: Primary physician provider listed below. Patient may have been seen by APP or partner within same practice.   PROVIDER ROLE / SPECIALTY LAST SHERLEAN Sjogren Lucas, MD General Surgery (Surgeon) [place] 07/29/2023  Antonette Angeline ORN, NP Primary Care Provider 12/19/2023  Anner Lenis, MD Cardiology 10/05/2023; preop APP call 12/29/2023  Lane Hussar, MD Neurology 12/14/2023   ALLERGIES: Allergies  Allergen Reactions   Niacin  And Related Hives and Itching    CURRENT HOME MEDICATIONS: No current facility-administered medications for this encounter.    albuterol  (VENTOLIN  HFA) 108 (90 Base) MCG/ACT inhaler   Cholecalciferol 50 MCG (2000 UT) CAPS   clobetasol  cream (TEMOVATE ) 0.05 %   clotrimazole (LOTRIMIN) 1 % cream   EFFIENT  10 MG TABS tablet   fluticasone  (FLONASE ) 50 MCG/ACT nasal spray   gabapentin (NEURONTIN) 100 MG capsule   hydrocortisone 2.5 % cream   metoprolol  succinate (TOPROL -XL) 25 MG 24 hr tablet   nitroGLYCERIN  (NITROSTAT ) 0.4 MG SL tablet   Omega-3 Fatty Acids (FISH OIL  PO)   omeprazole  (PRILOSEC) 40 MG capsule   rosuvastatin  (CRESTOR ) 40 MG tablet   sildenafil  (VIAGRA )  50 MG tablet   vitamin B-12 (CYANOCOBALAMIN ) 500 MCG tablet   Blood Glucose Monitoring Suppl DEVI   fenofibrate  (TRICOR ) 48 MG tablet   fluticasone -salmeterol (ADVAIR) 100-50 MCG/ACT AEPB   ketoconazole  (NIZORAL ) 2 % shampoo   Lancets (ONETOUCH DELICA PLUS LANCET33G) MISC   ONETOUCH ULTRA test strip   HISTORY: Past Medical History:   Diagnosis Date   (HFpEF) heart failure with preserved ejection fraction (HCC)    Allergy    Aortic atherosclerosis    Bilateral carotid artery disease    Bilateral cataracts    Bronchitis    Gets bronchitis almost every year   CAD S/P percutaneous coronary angioplasty 06/2011; 6/ & 11/2013   S/P PCI to all 3 major vessels;a)  Ant STEMI 2001- BMS-LAD x2 -->(redo PCI 6/'15 -- pLAD Xience DES 2.5 x 12- 2.75 mm, mLAD 2.25 x 12 - 2.7 mm), b) '06 UA --> Cx- OM DES; c) 2013 Inf MI BMS mRCA --> d) 10/'15 PCI dRCA Promus P DES 4.0 x 16 (4.25 mm); PTCA of RPL2 (2.0 mm) &RPDA (2.25 mm) 11/2013; 4/18 PCI OM1 Synergy DES  2.5 x 16   COPD (chronic obstructive pulmonary disease) (HCC)    Emphysema of lung (HCC)    Erectile dysfunction    a.) on PDE5i (sildenafil )   Essential hypertension 07/07/2011   GERD (gastroesophageal reflux disease)    Heart murmur    Hemorrhoids    History of colon polyps    History of heart artery stent    a.) TOTAL # stents as of 01/10/2024: 10   History of stomach ulcers    Hyperlipidemia with target LDL less than 70 07/07/2011   Ischemic cardiomyopathy    Long term current use of antithrombotics/antiplatelets    a.) prasugrel    Metabolic syndrome    a.) T2DM, HTN, HLD, and truncal obesity   OSA on CPAP    Pneumonia    Recurrent upper respiratory infection (URI)    Seizures (HCC) several   last one was ~ 2011 (11/15/2013)   Shortness of breath dyspnea    Smoker    ST elevation myocardial infarction (STEMI) of anterior wall (HCC) 2001   History of -- 2 stents in early and distal mid LAD; prior cardiologist was Dr. Aloysius Cedar in [place]   ST elevation myocardial infarction (STEMI) of inferior wall (HCC) 07/07/2011   Occluded RCA 2.75X18 INTEGRITY; Echo 07/2013: EF 45-50%, Inferior & Posterolateral HK.    T2DM (type 2 diabetes mellitus) (HCC)    Umbilical hernia    Unstable angina (HCC) 2006; 07/2013   Cx-OM - PCI 2.5 mm 13 mm Cypher DES [place]) ;  07/2013: Severe ISR of both prox & Distal LAD stentS --> 2 DES stents (1 at prox edge of the proximal stent, 2nd covers the entire distal stent as well as proximal and distal edge stenose)    Past Surgical History:  Procedure Laterality Date   BIOPSY N/A 06/30/2020   Procedure: BIOPSY;  Surgeon: Jinny Carmine, MD;  Location: [facility] SURGERY CNTR;  Service: Endoscopy;  Laterality: N/A;   Cardiac MRI [facility]  09/2014   EF 51%. Mod HK of basal-mid Inf wall, mild HK of basal inferoseptum & basal-mid inferolateral wall with hyper enhancement (c/w subendocard RCA MI with significant viability), focal hyper enhancement of apical wall c/w dLAD subendocard MI & complete LAD viability. . No aortic stenosis. Normal RV function. No Ischemia on Adenosine  Stress.   CATARACT EXTRACTION W/PHACO Left 11/19/2021   Procedure: CATARACT EXTRACTION  PHACO AND INTRAOCULAR LENS PLACEMENT (IOC) LEFT vivity lens;  Surgeon: Enola Feliciano Hugger, MD;  Location: [facility] SURGERY CN[facility];  Service: Ophthalmology;  Laterality: Left;  11.32 1:32.6   COLONOSCOPY WITH PROPOFOL  N/A 03/28/2015   Procedure: COLONOSCOPY WITH PROPOFOL ;  Surgeon: Rogelia Copping, MD;  Location: [facility];  Service: Endoscopy;  Laterality: N/A;   COLONOSCOPY WITH PROPOFOL  N/A 06/30/2020   Procedure: COLONOSCOPY WITH PROPOFOL ;  Surgeon: Copping Rogelia, MD;  Location: [facility] SURGERY CNTR;  Service: Endoscopy;  Laterality: N/A;   CORONARY ANGIOPLASTY WITH STENT PLACEMENT  2001   ANTERIOR mi with a  PCI to LAD; [place], [place] - Dr. Woodie   CORONARY ANGIOPLASTY WITH STENT PLACEMENT  2006   [place]  by Dr Woodie -lesion lft circ/OM1 with 2.5 x 13mm Cypher DES   CORONARY STENT INTERVENTION N/A 05/19/2016   Procedure: Coronary Stent Intervention;  Surgeon: Alm LELON Clay, MD;  Location: [facility] INVASIVE CV LAB;  Service: Cardiovascular: PCI pOM1 - Synergy DES 2.5 x 16 (overlaps old stent)   ESOPHAGOGASTRODUODENOSCOPY (EGD) WITH PROPOFOL  N/A 03/28/2015   Procedure:  ESOPHAGOGASTRODUODENOSCOPY (EGD) WITH PROPOFOL ;  Surgeon: Rogelia Copping, MD;  Location: [facility] SURGERY CNTR;  Service: Endoscopy;  Laterality: N/A;   ESOPHAGOGASTRODUODENOSCOPY (EGD) WITH PROPOFOL  N/A 06/30/2020   Procedure: ESOPHAGOGASTRODUODENOSCOPY (EGD) WITH PROPOFOL ;  Surgeon: Copping Rogelia, MD;  Location: [facility] SURGERY CNTR;  Service: Endoscopy;  Laterality: N/A;   ESOPHAGOGASTRODUODENOSCOPY (EGD) WITH PROPOFOL  N/A 09/11/2020   Procedure: ESOPHAGOGASTRODUODENOSCOPY (EGD) WITH PROPOFOL ;  Surgeon: Copping Rogelia, MD;  Location: [facility] SURGERY CNTR;  Service: Endoscopy;  Laterality: N/A;   HERNIA REPAIR     INSERTION OF MESH N/A 04/02/2015   Procedure: INSERTION OF MESH;  Surgeon: Charlie Troy Fell, MD;  Location: [facility] ORS;  Service: General;  Laterality: N/A;   LEFT AND RIGHT HEART CATHETERIZATION WITH CORONARY ANGIOGRAM N/A 07/30/2013   Procedure: LEFT AND RIGHT HEART CATHETERIZATION WITH CORONARY ANGIOGRAM;  Surgeon: Alm LELON Clay, MD;  Location: [facility] CATH LAB;  Service: Cardiovascular;  2 separate P & mLAD lesions -> PCI, MOd-Severe dRCA disesase with diffuse RPL/PDA disease (FFR)   LEFT HEART CATH  08/03/2013   Procedure: LEFT HEART CATH;  Surgeon: Alm LELON Clay, MD;  Location: [facility] CATH LAB;  Service: Cardiovascular;;FFR of RCA - non-flow-limiting   LEFT HEART CATH AND CORONARY ANGIOGRAPHY N/A 05/19/2016   Procedure: Left Heart Cath and Coronary Angiography;  Surgeon: Alm LELON Clay, MD;  Location: [facility] INVASIVE CV LAB;  Service: Cardiovascular: CULPRIT - 85% pOM1 pre-stent. Stents in both proximal and distal RCA widely patent. PTCA sites in RPL & PDA widely patent with stable 70% small RPL 2. Extensive stenting in the LAD widely patent stents with mild to mod Dz btw prox & mid-distal stents.    LEFT HEART CATH AND CORONARY ANGIOGRAPHY N/A 02/13/2018   Procedure: LEFT HEART CATH AND CORONARY ANGIOGRAPHY;  Surgeon: Clay Alm LELON, MD;  Location: [facility] INVASIVE CV LAB;  Service: Cardiovascular:  Similar findings to last cath post PCI.  Stents are patent.  Roughly 50% proximal LAD lesion noted, may be progression from prior cath but otherwise stable disease.   LEFT HEART CATHETERIZATION WITH CORONARY ANGIOGRAM N/A 07/07/2011   Procedure: LEFT HEART CATHETERIZATION WITH CORONARY ANGIOGRAM;  Surgeon: Alm LELON Clay, MD;  Location: [facility] CATH LAB;  Service: Cardiovascular;  inferolat post LV infarct ST-elevation MI   MET/CPET  11/09/2011   submax. effort 1.05 RER peak V02 was 51% ,chronotropic incomp.hrt rate lows 80s to 100   MET/CPET  10/2014  [facility]: Mild functional impairment due primarily to mild pulmonary and circulatory limitations. Also suggest physical deconditioning (seen with low-normal aerobic reserve). Mild VQ mismatch with exercise.  Ventilatory reserve exhausted with peak exercise demonstrated pulmonary limitation. No significant decrease in postexercise FEV1 compared to rest --> suggest no exercise induced bronchospasm   NM MYOVIEW  ([facility] HX)  11/30/2011   EF 45% ,exercise 10 METS, infarct\scar w mild perinfarct ischemia -basal inferolat and mid inferolat region   NM MYOVIEW  LTD  04/25/2016   Exercised for 9 minutes. Reached 90% max. Heart rate. 9.4 METS. Read as a low risk study with normal EF 55-65%. -> on Review -  there does appear to be a small to moderate sized, medium severtiy reversible perfusion defect in the anterior wall - read by computer, but not noted by reader.   PERCUTANEOUS CORONARY STENT INTERVENTION (PCI-S)  07/07/2011   Procedure: PERCUTANEOUS CORONARY STENT INTERVENTION (PCI-S);  Surgeon: Alm LELON Clay, MD;  Location: [facility] CATH LAB;  Service: Cardiovascular;;occluded RCA ,Integrity Resolute DES 2.75 x 18 mm stent - post-dilated 3.1 mm   PERCUTANEOUS CORONARY STENT INTERVENTION (PCI-S)  07/30/2013   Procedure: PERCUTANEOUS CORONARY STENT INTERVENTION (PCI-S);  Surgeon: Alm LELON Clay, MD;  Location: [facility] CATH LAB;  Service: Cardiovascular;;Distal mid LAD: Xience  Alpine DES 2.25 mm x 28 mm (overlapping proximal and distal edge of previous stent) - 2.7 mm; proximal LAD 2.5 mm x 12 mm Xience Alpine DES (2.8 mm);; RCA 60-70% stenosis planned staged procedure   PERCUTANEOUS CORONARY STENT INTERVENTION (PCI-S) N/A 11/15/2013   Procedure: PERCUTANEOUS CORONARY STENT INTERVENTION (PCI-S);  Surgeon: Alm LELON Clay, MD;  Location: [facility] CATH LAB;  Service: Cardiovascular;  Patent Cx & LAD Stents; PCI -dRCA Promus Premier DES 4.0 mm x 16 mm (4.25 mm) dRCA, 2.0 mm Cutting PTCA of RPL2 [place] & POBA of mRPDA   POLYPECTOMY  03/28/2015   Procedure: POLYPECTOMY;  Surgeon: Rogelia Copping, MD;  Location: [facility] SURGERY CNTR;  Service: Endoscopy;;   POLYPECTOMY N/A 06/30/2020   Procedure: POLYPECTOMY;  Surgeon: Copping Rogelia, MD;  Location: [facility] SURGERY CNTR;  Service: Endoscopy;  Laterality: N/A;   RIGHT HEART CATH  07/2013   Normal pressures with severely reduced cardiac output and index. (3.25/1.55)   TRANSTHORACIC ECHOCARDIOGRAM  07/31/2013; February 2017   a. LVEF 45-50. Inferior posterior hypokinesis with mild dilation. Apparent normal diastolic pressures;    UMBILICAL HERNIA REPAIR N/A 04/02/2015   Procedure: HERNIA REPAIR UMBILICAL ADULT;  Surgeon: Charlie Troy Fell, MD;  Location: [facility] ORS;  Service: General;  Laterality: N/A;   Family History  Problem Relation Age of Onset   Coronary artery disease Father    Heart disease Father    Stroke Father    Hypertension Father    COPD Father    Hyperlipidemia Mother    Diabetes Mother    Hyperlipidemia Maternal Grandmother    Diabetes Maternal Grandmother    Hyperlipidemia Maternal Grandfather    Diabetes Maternal Grandfather    Emphysema Maternal Grandfather    Heart disease Paternal Grandmother    Heart disease Paternal Grandfather    Cancer Maternal Aunt        breast   Social History   Tobacco Use   Smoking status: Every Day    Current packs/day: 2.25    Average packs/day: 2.2 packs/day for 48.9 years  (110.1 ttl pk-yrs)    Types: Cigarettes    Start date: 69   Smokeless tobacco: Never   Tobacco comments:    Quit with  Bupropion  150mg  - > unfortunately, he restarted; has cut all the way down to maybe 1 or 2 cigarettes a day, some days he goes without any.-Some days even multiple days without cigarettes.    12/31/2022 patient smokes about 14 cigarettes daily  Substance Use Topics   Alcohol use: Not Currently    Comment: rare   LABS:  Lab Results  Component Value Date   WBC 11.2 (H) 12/19/2023   HGB 17.5 (H) 12/19/2023   HCT 53.3 (H) 12/19/2023   MCV 89.6 12/19/2023   PLT 359 12/19/2023   Lab Results  Component Value Date   NA 139 12/19/2023   CL 105 12/19/2023   K 4.1 12/19/2023   CO2 25 12/19/2023   BUN 11 12/19/2023   CREATININE 0.93 12/19/2023   EGFR 92 12/19/2023   CALCIUM  9.7 12/19/2023   ALBUMIN 4.3 05/05/2020   GLUCOSE 109 (H) 12/19/2023   Lab Results  Component Value Date   HGBA1C 6.1 (H) 12/19/2023   ECG: Date: 10/05/2023  Time ECG obtained: 1433 PM Rate: 95 bpm Rhythm: normal sinus Axis (leads I and aVF): normal Intervals: PR 138 ms. QRS 94 ms. QTc 447 ms. ST segment and T wave changes: No evidence of acute T wave abnormalities or significant ST segment elevation or depression.  Evidence of a possible, age undetermined, prior infarct:  No Comparison: Similar to previous tracing obtained on 12/31/2022   IMAGING / PROCEDURES: CT CHEST LUNG CA SCREEN LOW DOSE W/O CM performed on 01/25/2023 Lung-RADS 2S, benign appearance or behavior. Continue annual screening with low-dose chest CT without contrast in 12 months. The S modifier above refers to potentially clinically significant non lung cancer related findings. Specifically, there is aortic atherosclerosis, in addition to left main and three-vessel coronary artery disease. Please note that although the presence of coronary artery calcium  documents the presence of coronary artery disease, the severity of  this disease and any potential stenosis cannot be assessed on this non-gated CT examination. Assessment for potential risk factor modification, dietary therapy or pharmacologic therapy may be warranted, if clinically indicated. Mild diffuse bronchial wall thickening with moderate centrilobular and paraseptal emphysema; imaging findings suggestive of underlying COPD. Aortic atherosclerosis  LEFT HEART CATHETERIZATION AND CORONARY ANGIOGRAPHY performed on 02/13/2018 Essentially stable coronary disease with patent stents in all 3 major vessels.  Mild progression of disease in the proximal LAD but not apparently flow-limiting compared to prior angiographic results. Ost LAD to Prox LAD lesion is 50% stenosed. Previously placed Ost 1st Mrg to 1st Mrg overlapping DES R widely patent. Prox Cx lesion is 20% stenosed at bifurcation with OM1. Previously placed Prox LAD stent (overlapping BMS covered with DES) is widely patent.  Previously placed Prox LAD to Mid LAD stent (BMS covered with DES) is widely patent. Previously placed Prox RCA stent is 20% stenosed.  Previously placed Dist RCA stent (unknown type) is widely patent. Ost RPDA to RPDA lesion is 30% stenosed at previous PTCA site Post Atrio lesion is 10% stenosed -at previous PTA site.  2nd RPLB lesion is 50% stenosed. Stable mildly reduced EF compared to last evaluation. EF 45-50%. Suspect microvascular disease. RECOMMENDATION Optimize medical management.  Will increase Imdur  and outpatient follow-up. If symptoms consistently progress, would check nuclear stress test to evaluate for anterior ischemia which is the only area of concern.  If this is a case, would potentially consider single-vessel CABG with LIMA-LAD versus additional stenting.    VAS US  CAROTID performed on 01/25/2017 There is evidence in the  right ICA of a 1-39% stenosis.  There is evidence in the left ICA of a 1-39% stenosis.  Both vertebral arteries were patent with antegrade  flow.  Normal flow hemodynamics were seen in bilateral subclavian arteries.   LONG TERM CARDIAC EVENT MONITOR STUDY performed on 11/25/2016 The patient's monitoring period was 11/25/2016 - 12/24/2016.  Baseline sample showed Sinus Tachycardia with a heart rate of 102 bpm.  12 stable events manually triggered - 6 with Sinus Tachycardia   MYOCARDIAL PERFUSION IMAGING STUDY (LEXISCAN) performed on 04/27/2016 Nuclear stress EF: 57% The left ventricular ejection fraction is normal (55-65%). The study is normal. This is a low risk study.  TRANSTHORACIC ECHOCARDIOGRAM performed on 03/26/2015 Low normal left ventricular systolic function with an EF of 50-55% Posterior lateral hypokinesis Ventricular cavity size mildly dilated Left ventricular diastolic Doppler parameters consistent with abnormal relaxation (G1DD). Trivial tricuspid valve regurgitation Normal gradients; no valvular stenosis  IMPRESSION AND PLAN: Troy Parrish. has been referred for pre-anesthesia review and clearance prior to him undergoing the planned anesthetic and procedural courses. Available labs, pertinent testing, and imaging results were personally reviewed by me in preparation for upcoming operative/procedural course. [facility] medical record has been updated following extensive record review and patient interview with PAT staff.   This patient has been appropriately cleared by cardiology with an overall ACCEPTABLE risk of patient experiencing significant perioperative cardiovascular complications. here at [facility]. Based on clinical review performed today (01/10/24), barring any significant acute changes in the patient's overall condition, it is anticipated that he will be able to proceed with the planned surgical intervention. Any acute changes in clinical condition may necessitate his procedure being postponed and/or cancelled. Patient will meet with anesthesia team (MD and/or  CRNA) on the day of his procedure for preoperative evaluation/assessment. Questions regarding anesthetic course will be fielded at that time.   Pre-surgical instructions were reviewed with the patient during his PAT appointment, and questions were fielded to satisfaction by PAT clinical staff. He has been instructed on which medications that he will need to hold prior to surgery, as well as the ones that have been deemed safe/appropriate to take on the day of his procedure. As part of the general education provided by PAT, patient made aware both verbally and in writing, that he would need to abstain from the use of any illegal substances during his perioperative course. He was advised that failure to follow the provided instructions could necessitate case cancellation or result in serious perioperative complications up to and including death. Patient encouraged to contact PAT and/or his surgeon's office to discuss any questions or concerns that may arise prior to surgery; verbalized understanding.   Dorise Pereyra, MSN, APRN, FNP-C, CEN [facility]  Perioperative Services Nurse Practitioner Phone: [phone] Fax: [phone] 01/10/24 8:41 AM  NOTE: This note has been prepared using Dragon dictation software. Despite my best ability to proofread, there is always the potential that unintentional transcriptional errors may still occur from this process.

## 2024-01-09 DIAGNOSIS — H2511 Age-related nuclear cataract, right eye: Secondary | ICD-10-CM | POA: Diagnosis not present

## 2024-01-09 NOTE — Telephone Encounter (Signed)
 Pt of Dr. Anner. Does Dr. Anner want to refill this RX? Please advise.

## 2024-01-10 ENCOUNTER — Encounter: Payer: Self-pay | Admitting: General Surgery

## 2024-01-10 MED ORDER — LACTATED RINGERS IV SOLN: INTRAVENOUS | Status: DC

## 2024-01-10 MED ORDER — CHLORHEXIDINE GLUCONATE 0.12 % MT SOLN
15.0000 mL | Freq: Once | OROMUCOSAL | Status: AC
  Administered 2024-01-11: 15 mL via OROMUCOSAL

## 2024-01-10 MED ORDER — ORAL CARE MOUTH RINSE: 15.0000 mL | Freq: Once | OROMUCOSAL | Status: AC

## 2024-01-10 MED ORDER — CEFAZOLIN SODIUM-DEXTROSE 2-4 GM/100ML-% IV SOLN
2.0000 g | INTRAVENOUS | Status: AC
  Administered 2024-01-11: 2 g via INTRAVENOUS

## 2024-01-10 MED ORDER — OMEPRAZOLE 40 MG PO CPDR: 40.0000 mg | DELAYED_RELEASE_CAPSULE | Freq: Every day | ORAL | 2 refills | Status: AC

## 2024-01-10 NOTE — Telephone Encounter (Signed)
 Rx 01/04/24 #180 1RF- duplicate request Requested Prescriptions  Pending Prescriptions Disp Refills   fluticasone -salmeterol (ADVAIR) 100-50 MCG/ACT AEPB [Pharmacy Med Name: FLUTICASONE -SALMETEROL 100-50] 180 each 1    Sig: INHALE 1 PUFF BY MOUTH 2 TIMES A DAY - IN THE MORNING AND IN THE EVENING     Pulmonology:  Combination Products Passed - 01/10/2024 10:27 AM      Passed - Valid encounter within last 12 months    Recent Outpatient Visits           3 weeks ago Encounter for general adult medical examination with abnormal findings   [facility] [place], Angeline ORN, NP   5 months ago Hordeolum externum of right lower eyelid   [facility] [place], [place] W, NP   6 months ago Type 2 diabetes mellitus with complication [facility])   [facility] [place], Angeline ORN, [place]

## 2024-01-11 ENCOUNTER — Ambulatory Visit: Payer: Self-pay | Admitting: Urgent Care

## 2024-01-11 ENCOUNTER — Encounter: Payer: Self-pay | Admitting: General Surgery

## 2024-01-11 ENCOUNTER — Encounter
Admission: RE | Disposition: A | Discharge status: Home / Self Care | Payer: Self-pay | Source: Home / Self Care | Attending: General Surgery

## 2024-01-11 ENCOUNTER — Ambulatory Visit
Admission: RE | Admit: 2024-01-11 | Discharge: 2024-01-11 | Disposition: A | Attending: General Surgery | Admitting: General Surgery

## 2024-01-11 ENCOUNTER — Other Ambulatory Visit: Payer: Self-pay

## 2024-01-11 DIAGNOSIS — L290 Pruritus ani: Secondary | ICD-10-CM | POA: Diagnosis not present

## 2024-01-11 DIAGNOSIS — I110 Hypertensive heart disease with heart failure: Secondary | ICD-10-CM | POA: Diagnosis not present

## 2024-01-11 DIAGNOSIS — L720 Epidermal cyst: Secondary | ICD-10-CM | POA: Diagnosis not present

## 2024-01-11 DIAGNOSIS — K219 Gastro-esophageal reflux disease without esophagitis: Secondary | ICD-10-CM | POA: Diagnosis not present

## 2024-01-11 DIAGNOSIS — I2510 Atherosclerotic heart disease of native coronary artery without angina pectoris: Secondary | ICD-10-CM | POA: Diagnosis not present

## 2024-01-11 DIAGNOSIS — J439 Emphysema, unspecified: Secondary | ICD-10-CM | POA: Diagnosis not present

## 2024-01-11 DIAGNOSIS — Z833 Family history of diabetes mellitus: Secondary | ICD-10-CM | POA: Diagnosis not present

## 2024-01-11 DIAGNOSIS — G4733 Obstructive sleep apnea (adult) (pediatric): Secondary | ICD-10-CM | POA: Diagnosis not present

## 2024-01-11 DIAGNOSIS — Z955 Presence of coronary angioplasty implant and graft: Secondary | ICD-10-CM | POA: Diagnosis not present

## 2024-01-11 DIAGNOSIS — I5032 Chronic diastolic (congestive) heart failure: Secondary | ICD-10-CM | POA: Diagnosis not present

## 2024-01-11 DIAGNOSIS — I252 Old myocardial infarction: Secondary | ICD-10-CM | POA: Diagnosis not present

## 2024-01-11 DIAGNOSIS — R569 Unspecified convulsions: Secondary | ICD-10-CM | POA: Diagnosis not present

## 2024-01-11 DIAGNOSIS — I255 Ischemic cardiomyopathy: Secondary | ICD-10-CM | POA: Diagnosis not present

## 2024-01-11 DIAGNOSIS — K644 Residual hemorrhoidal skin tags: Secondary | ICD-10-CM | POA: Diagnosis not present

## 2024-01-11 DIAGNOSIS — F17210 Nicotine dependence, cigarettes, uncomplicated: Secondary | ICD-10-CM | POA: Diagnosis not present

## 2024-01-11 DIAGNOSIS — I509 Heart failure, unspecified: Secondary | ICD-10-CM | POA: Diagnosis not present

## 2024-01-11 HISTORY — DX: Type 2 diabetes mellitus without complications: E11.9

## 2024-01-11 HISTORY — DX: Nicotine dependence, unspecified, uncomplicated: F17.200

## 2024-01-11 HISTORY — DX: Atherosclerosis of aorta: I70.0

## 2024-01-11 HISTORY — DX: Disorder of arteries and arterioles, unspecified: I77.9

## 2024-01-11 HISTORY — DX: Ischemic cardiomyopathy: I25.5

## 2024-01-11 HISTORY — DX: Male erectile dysfunction, unspecified: N52.9

## 2024-01-11 HISTORY — DX: Presence of coronary angioplasty implant and graft: Z95.5

## 2024-01-11 HISTORY — DX: Unspecified cataract: H26.9

## 2024-01-11 HISTORY — PX: HEMORRHOID SURGERY: SHX153

## 2024-01-11 HISTORY — DX: Long term (current) use of antithrombotics/antiplatelets: Z79.02

## 2024-01-11 HISTORY — DX: Unspecified hemorrhoids: K64.9

## 2024-01-11 HISTORY — DX: Unspecified diastolic (congestive) heart failure: I50.30

## 2024-01-11 LAB — GLUCOSE, CAPILLARY: Glucose-Capillary: 139 mg/dL — ABNORMAL HIGH (ref 70–99)

## 2024-01-11 SURGERY — HEMORRHOIDECTOMY
Anesthesia: General | Site: Rectum

## 2024-01-11 MED ORDER — LIDOCAINE HCL (PF) 2 % IJ SOLN
INTRAMUSCULAR | Status: AC
  Filled 2024-01-11: qty 5

## 2024-01-11 MED ORDER — FENTANYL CITRATE (PF) 100 MCG/2ML IJ SOLN: 25.0000 ug | INTRAMUSCULAR | Status: DC | PRN

## 2024-01-11 MED ORDER — PROPOFOL 500 MG/50ML IV EMUL: INTRAVENOUS | Status: DC | PRN

## 2024-01-11 MED ORDER — PROPOFOL 10 MG/ML IV BOLUS
INTRAVENOUS | Status: AC
  Filled 2024-01-11: qty 20

## 2024-01-11 MED ORDER — CHLORHEXIDINE GLUCONATE 0.12 % MT SOLN
OROMUCOSAL | Status: AC
  Filled 2024-01-11: qty 15

## 2024-01-11 MED ORDER — MIDAZOLAM HCL (PF) 2 MG/2ML IJ SOLN
INTRAMUSCULAR | Status: DC | PRN
  Administered 2024-01-11: 2 mg via INTRAVENOUS

## 2024-01-11 MED ORDER — BUPIVACAINE-EPINEPHRINE (PF) 0.5% -1:200000 IJ SOLN
INTRAMUSCULAR | Status: AC
  Filled 2024-01-11: qty 30

## 2024-01-11 MED ORDER — 0.9 % SODIUM CHLORIDE (POUR BTL) OPTIME
TOPICAL | Status: DC | PRN
  Administered 2024-01-11: 1000 mL

## 2024-01-11 MED ORDER — CEFAZOLIN SODIUM-DEXTROSE 2-4 GM/100ML-% IV SOLN
INTRAVENOUS | Status: AC
  Filled 2024-01-11: qty 100

## 2024-01-11 MED ORDER — GELATIN ABSORBABLE 12-7 MM EX MISC
CUTANEOUS | Status: AC
  Filled 2024-01-11: qty 1

## 2024-01-11 MED ORDER — ONDANSETRON HCL 4 MG/2ML IJ SOLN
INTRAMUSCULAR | Status: AC
  Filled 2024-01-11: qty 2

## 2024-01-11 MED ORDER — MIDAZOLAM HCL 2 MG/2ML IJ SOLN
INTRAMUSCULAR | Status: AC
  Filled 2024-01-11: qty 2

## 2024-01-11 MED ORDER — ONDANSETRON HCL 4 MG/2ML IJ SOLN
INTRAMUSCULAR | Status: DC | PRN
  Administered 2024-01-11: 4 mg via INTRAVENOUS

## 2024-01-11 MED ORDER — BUPIVACAINE-EPINEPHRINE 0.5% -1:200000 IJ SOLN
INTRAMUSCULAR | Status: DC | PRN
  Administered 2024-01-11: 50 mL via INTRAMUSCULAR

## 2024-01-11 MED ORDER — GELATIN ABSORBABLE 100 EX MISC
CUTANEOUS | Status: DC | PRN
  Administered 2024-01-11: 1

## 2024-01-11 MED ORDER — FENTANYL CITRATE (PF) 100 MCG/2ML IJ SOLN
INTRAMUSCULAR | Status: AC
  Filled 2024-01-11: qty 2

## 2024-01-11 MED ORDER — PROPOFOL 500 MG/50ML IV EMUL
INTRAVENOUS | Status: DC | PRN
  Administered 2024-01-11: 80 mg via INTRAVENOUS
  Administered 2024-01-11: 120 ug/kg/min via INTRAVENOUS

## 2024-01-11 MED ORDER — FENTANYL CITRATE (PF) 100 MCG/2ML IJ SOLN
INTRAMUSCULAR | Status: DC | PRN
  Administered 2024-01-11 (×2): 50 ug via INTRAVENOUS

## 2024-01-11 MED ORDER — SEVOFLURANE IN SOLN
RESPIRATORY_TRACT | Status: AC
  Filled 2024-01-11: qty 250

## 2024-01-11 MED ORDER — BUPIVACAINE LIPOSOME 1.3 % IJ SUSP
INTRAMUSCULAR | Status: AC
  Filled 2024-01-11: qty 20

## 2024-01-11 MED ORDER — HYDROCODONE-ACETAMINOPHEN 5-325 MG PO TABS
1.0000 | ORAL_TABLET | Freq: Four times a day (QID) | ORAL | 0 refills | Status: AC | PRN
  Filled 2024-01-11: qty 12, 3d supply, fill #0

## 2024-01-11 MED ORDER — LIDOCAINE HCL (CARDIAC) PF 100 MG/5ML IV SOSY
PREFILLED_SYRINGE | INTRAVENOUS | Status: DC | PRN
  Administered 2024-01-11: 80 mg via INTRAVENOUS

## 2024-01-11 MED ORDER — PROPOFOL 1000 MG/100ML IV EMUL
INTRAVENOUS | Status: AC
  Filled 2024-01-11: qty 100

## 2024-01-11 SURGICAL SUPPLY — 30 items
BRIEF MESH DISP 2XL (UNDERPADS AND DIAPERS) ×1 IMPLANT
DISSECTOR SURG LIGASURE 21 (MISCELLANEOUS) IMPLANT
DRAPE LAPAROTOMY 100X77 ABD (DRAPES) ×1 IMPLANT
DRAPE LEGGINS SURG 28X43 STRL (DRAPES) ×1 IMPLANT
DRAPE UNDER BUTTOCK W/FLU (DRAPES) ×1 IMPLANT
ELECTRODE REM PT RTRN 9FT ADLT (ELECTROSURGICAL) ×1 IMPLANT
GAUZE 4X4 16PLY ~~LOC~~+RFID DBL (SPONGE) IMPLANT
GAUZE SPONGE 4X4 12PLY STRL (GAUZE/BANDAGES/DRESSINGS) ×1 IMPLANT
GLOVE BIO SURGEON STRL SZ 6.5 (GLOVE) ×1 IMPLANT
GLOVE BIOGEL PI IND STRL 6.5 (GLOVE) ×1 IMPLANT
GLOVE SURG SYN 6.5 PF PI (GLOVE) ×2 IMPLANT
GOWN STRL REUS W/ TWL LRG LVL3 (GOWN DISPOSABLE) ×3 IMPLANT
KIT TURNOVER CYSTO (KITS) ×1 IMPLANT
LABEL OR SOLS (LABEL) ×1 IMPLANT
MANIFOLD NEPTUNE II (INSTRUMENTS) ×1 IMPLANT
NDL HYPO 22X1.5 SAFETY MO (MISCELLANEOUS) ×1 IMPLANT
NS IRRIG 500ML POUR BTL (IV SOLUTION) ×1 IMPLANT
PACK BASIN MINOR ARMC (MISCELLANEOUS) ×1 IMPLANT
PAD ABD DERMACEA PRESS 5X9 (GAUZE/BANDAGES/DRESSINGS) ×1 IMPLANT
PAD PREP OB/GYN DISP 24X41 (PERSONAL CARE ITEMS) ×1 IMPLANT
SOLN STERILE WATER 500 ML (IV SOLUTION) ×1 IMPLANT
SOLUTION PREP PVP 2OZ (MISCELLANEOUS) ×1 IMPLANT
SURGILUBE 2OZ TUBE FLIPTOP (MISCELLANEOUS) ×1 IMPLANT
SUT VIC AB 2-0 SH 27XBRD (SUTURE) ×1 IMPLANT
SUT VIC AB 3-0 SH 27X BRD (SUTURE) ×1 IMPLANT
SUTURE EHLN 3-0 FS-10 30 BLK (SUTURE) IMPLANT
SUTURE MNCRL 4-0 27XMF (SUTURE) IMPLANT
SYR 10ML LL (SYRINGE) ×1 IMPLANT
SYR BULB IRRIG 60ML STRL (SYRINGE) ×1 IMPLANT
TRAP FLUID SMOKE EVACUATOR (MISCELLANEOUS) ×1 IMPLANT

## 2024-01-11 NOTE — Transfer of Care (Signed)
 Immediate Anesthesia Transfer of Care Note  Patient: Troy Parrish.  Procedure(s) Performed: HEMORRHOIDECTOMY (Rectum)  Patient Location: PACU  Anesthesia Type:MAC  Level of Consciousness: sedated  Airway & Oxygen Therapy: Patient Spontanous Breathing  Post-op Assessment: Report given to RN  Post vital signs: Reviewed and stable  Last Vitals:  Vitals Value Taken Time  BP 134/85 01/11/24 12:20  Temp    Pulse 84 01/11/24 12:22  Resp 14 01/11/24 12:22  SpO2 100 % 01/11/24 12:22  Vitals shown include unfiled device data.  Last Pain:  Vitals:   01/11/24 1053  TempSrc: Temporal  PainSc: 0-No pain         Complications: No notable events documented.

## 2024-01-11 NOTE — Interval H&P Note (Signed)
 History and Physical Interval Note:  01/11/2024 11:19 AM  Troy Parrish.  has presented today for surgery, with the diagnosis of K64.4 external hemorrhoids.  The various methods of treatment have been discussed with the patient and family. After consideration of risks, benefits and other options for treatment, the patient has consented to  Procedure(s): HEMORRHOIDECTOMY (N/A) as a surgical intervention.  The patient's history has been reviewed, patient examined, no change in status, stable for surgery.  I have reviewed the patient's chart and labs.  Questions were answered to the patient's satisfaction.     Lucas Sjogren

## 2024-01-11 NOTE — Anesthesia Preprocedure Evaluation (Signed)
 Anesthesia Evaluation  Patient identified by MRN, date of birth, ID band Patient awake    Reviewed: Allergy & Precautions, H&P , NPO status , Patient's Chart, lab work & pertinent test results, reviewed documented beta blocker date and time   History of Anesthesia Complications Negative for: history of anesthetic complications  Airway Mallampati: II  TM Distance: >3 FB Neck ROM: full    Dental no notable dental hx. (+) Chipped, Poor Dentition   Pulmonary neg shortness of breath, sleep apnea and Continuous Positive Airway Pressure Ventilation , COPD (mild),  COPD inhaler, Recent URI , Current Smoker and Patient abstained from smoking.   Pulmonary exam normal breath sounds clear to auscultation       Cardiovascular Exercise Tolerance: Good hypertension, On Medications (-) angina + CAD, + Past MI (2013), + Cardiac Stents (x12 stents;) and +CHF  (-) CABG Normal cardiovascular exam(-) dysrhythmias + Valvular Problems/Murmurs  Rhythm:regular Rate:Normal  Cardiomyopathy, ischemic; EF~45%    Neuro/Psych  Headaches, Seizures - (2011),   negative psych ROS   GI/Hepatic negative GI ROS, Neg liver ROS,GERD  Controlled,,  Endo/Other  diabetes    Renal/GU negative Renal ROS  negative genitourinary   Musculoskeletal   Abdominal   Peds  Hematology negative hematology ROS (+)   Anesthesia Other Findings Past Medical History:   Essential hypertension                          07/07/2011    History of colon polyps                                      Hyperlipidemia with target LDL less than 70     07/07/2011    Recurrent upper respiratory infection (URI)                  CAD S/P percutaneous coronary angioplasty       07/07/2011*     Comment:S/P PCI to all 3 major vessels;a)  Ant STEMI               2001- BMS-LAD x2 -->(redo PCI 6/'15 -- pLAD               Xience DES 2.5 x 12- 2.75 mm, mLAD 2.25 x 12 -               2.7 mm), b) '06 UA  --> Cx- OM DES; c) 2013 Inf               MI BMS mRCA --> d) 10/'15 PCI dRCA Promus P DES              4.0 x 16 (4.25 mm); PTCA of RPL2 (2.0 mm) &RPDA              (2.25 mm) 11/2013   ST elevation myocardial infarction (STEMI) of * 2001           Comment:History of -- 2 stents in early and distal mid               LAD; prior cardiologist was Dr. Aloysius Cedar in               [place]   ST elevation myocardial infarction (STEMI) of * 07/07/2011      Comment:Occluded RCA 2.75X18 INTEGRITY; Echo 07/2013: EF  45-50%, Inferior & Posterolateral HK.    Unstable angina (HCC)                            2006; Ju*     Comment:Cx-OM - PCI 2.5 mm 13 mm Cypher DES               [place], [place]) ; 07/2013: Severe ISR of both               prox & Distal LAD stentS --> 2 DES stents (1 at              prox edge of the proximal stent, 2nd covers the              entire distal stent as well as proximal and               distal edge stenose)    COPD (chronic obstructive pulmonary disease) (*              Former heavy tobacco smoker                                    Comment: quit in May 2015 after multiple attempts at               trying to quit before    Glucose intolerance (pre-diabetes)              June 2015      Comment: hemoglobin A1c 6.6   Metabolic syndrome                                             Comment:Pre-diabetes, hypertension and truncal obesity               as well as dyslipidemia   Upper respiratory infection                                  Heart murmur                                                 Pneumonia                                       several *   Chronic bronchitis (HCC)                        used to *   OSA on CPAP                                                  GERD (gastroesophageal reflux disease)                       History of stomach ulcers  Headache                                                        Comment:Imdur  related; stopped taking it; headaches               went away (11/15/2013)   Seizures (HCC)                                  several      Comment:last one was ~ 2011 (11/15/2013)   Shortness of breath dyspnea                                  Elevated WBC count                                           Emphysema of lung (HCC)                                      Reproductive/Obstetrics negative OB ROS                              Anesthesia Physical Anesthesia Plan  ASA: III  Anesthesia Plan: General   Post-op Pain Management:    Induction: Intravenous  PONV Risk Score and Plan: 1 and Propofol  infusion, TIVA and Treatment may vary due to age or medical condition  Airway Management Planned: Natural Airway and Simple Face Mask  Additional Equipment:   Intra-op Plan:   Post-operative Plan:   Informed Consent: I have reviewed the patients History and Physical, chart, labs and discussed the procedure including the risks, benefits and alternatives for the proposed anesthesia with the patient or authorized representative who has indicated his/her understanding and acceptance.     Dental Advisory Given  Plan Discussed with: Anesthesiologist, CRNA and Surgeon  Anesthesia Plan Comments:          Anesthesia Quick Evaluation

## 2024-01-11 NOTE — Op Note (Signed)
 Preoperative diagnosis: External hemorrhoid.   Postoperative diagnosis: External hemorrhoid.  Procedure: Anoscopy, external hemorrhoidectomy.  Surgeon: Dr. Rodolph  Anesthesia: Spinal  Wound classification: Clean Contaminated  Indications: Patient is a 63 y.o. male was found to have symptomatic hemorrhoids refractory to medical managemen.   Findings: 1. Anterior chronic inflamed external hemorrhoid 2. Internal and external anal sphincter identified and preserved 3. Adequate hemostasis  Description of procedure: The patient was brought to the operating room and MAC anesthesia was induced. Patient was placed in the lithotomy position. A time-out was completed verifying correct patient, procedure, site, positioning, and implant(s) and/or special equipment prior to beginning this procedure. The buttocks were taped apart.  The perineum was prepped and draped in standard sterile fashion. Local anesthetic was injected as a perianal block. An anoscope was introduced and the three hemorrhoidal pedicles were identified.  Only the anterior external hemorrhoid was abnormal and large.  A Kelly clamp was placed near the tip of the external hemorrhoid and retracted.  An elliptical incision was made around the external hemorrhoid and excising a minimum amount of anoderm. The pedicle was amputated from the base. Hemostasis was achieved using electrocautery. Following hemostasis, the skin was closed with 3-0 Vicryl in an interrupted 4 Monocryl. The anal canal was then injected with local anesthetic. A gauze pad was tucked between the gluteal folds.  The patient tolerated the procedure well and was taken to the postanesthesia care unit in stable condition.   Specimen: hemorrhoid  Complications: none  EBL: 5 mL

## 2024-01-11 NOTE — Discharge Instructions (Signed)
   Diet: Resume home heart healthy regular diet.   Activity: Increase activity as tolerated. Light activity and walking are encouraged. Do not drive or drink alcohol if taking narcotic pain medications.  Wound care: May shower with soapy water and pat dry (do not rub incisions), but no baths or submerging incision underwater until follow-up. (no swimming)   Medications: Resume all home medications. For mild to moderate pain: acetaminophen (Tylenol) or ibuprofen (if no kidney disease). Combining Tylenol with alcohol can substantially increase your risk of causing liver disease. Narcotic pain medications, if prescribed, can be used for severe pain, though may cause nausea, constipation, and drowsiness. Do not combine Tylenol and Norco within a 6 hour period as Norco contains Tylenol. If you do not need the narcotic pain medication, you do not need to fill the prescription.  Call office [phone]) at any time if any questions, worsening pain, fevers/chills, bleeding, drainage from incision site, or other concerns.

## 2024-01-12 ENCOUNTER — Encounter: Payer: Self-pay | Admitting: General Surgery

## 2024-01-12 NOTE — Anesthesia Postprocedure Evaluation (Signed)
 Anesthesia Post Note  Patient: Troy Parrish.  Procedure(s) Performed: HEMORRHOIDECTOMY (Rectum)  Patient location during evaluation: PACU Anesthesia Type: General Level of consciousness: awake and alert Pain management: pain level controlled Vital Signs Assessment: post-procedure vital signs reviewed and stable Respiratory status: spontaneous breathing, nonlabored ventilation, respiratory function stable and patient connected to nasal cannula oxygen Cardiovascular status: blood pressure returned to baseline and stable Postop Assessment: no apparent nausea or vomiting Anesthetic complications: no   No notable events documented.   Last Vitals:  Vitals:   01/11/24 1245 01/11/24 1305  BP: (!) 139/90 134/78  Pulse: 85 79  Resp: 17 18  Temp: 36.4 C (!) 36.2 C  SpO2: 97% 100%    Last Pain:  Vitals:   01/11/24 1305  TempSrc: Temporal  PainSc: 0-No pain                 Prentice Murphy

## 2024-01-13 LAB — SURGICAL PATHOLOGY

## 2024-01-16 ENCOUNTER — Other Ambulatory Visit: Payer: Self-pay | Admitting: Cardiology

## 2024-01-18 ENCOUNTER — Other Ambulatory Visit: Payer: Self-pay | Admitting: Cardiology

## 2024-01-26 ENCOUNTER — Encounter: Payer: Self-pay | Admitting: Urgent Care

## 2024-01-26 ENCOUNTER — Ambulatory Visit: Admit: 2024-01-26

## 2024-01-26 SURGERY — PHACOEMULSIFICATION, CATARACT, WITH IOL INSERTION
Anesthesia: Topical | Laterality: Right

## 2024-02-22 ENCOUNTER — Telehealth: Payer: Self-pay

## 2024-02-22 DIAGNOSIS — Z122 Encounter for screening for malignant neoplasm of respiratory organs: Secondary | ICD-10-CM

## 2024-02-22 NOTE — Telephone Encounter (Signed)
 Spoke with patient who states he had a lung cancer screening last year, but is requesting a full cancer screening? He said he just had a family member diagnosed with stage 4 lung cancer. He has a chronic cough, but denies any other symptoms. Anything further to advise? Thanks!

## 2024-02-22 NOTE — Telephone Encounter (Signed)
 Copied from CRM [id number]. Topic: Referral - Question >> Feb 22, 2024  1:15 PM Terri G wrote: Reason for CRM: Dr.Baity put a referral in for patient for CANCER SCREENING and he had an appointment in 01/2023 so he's wondering if he needs a new referral to do it again or can he just call and schedule an appointment? And he wants the full panel screening this time. If Dr.Baity or her nurse can call patient when they have that answer please. Callback number [phone]

## 2024-02-23 NOTE — Telephone Encounter (Signed)
 CT lung cancer screening ordered.  We can discuss additional cancer screening test at his next appointment.

## 2024-02-23 NOTE — Addendum Note (Signed)
 Addended by: ANTONETTE ANGELINE ORN on: 02/23/2024 07:55 AM   Modules accepted: Orders

## 2024-02-25 ENCOUNTER — Other Ambulatory Visit: Payer: Self-pay | Admitting: Cardiology

## 2024-02-25 ENCOUNTER — Other Ambulatory Visit (HOSPITAL_COMMUNITY): Payer: Self-pay

## 2024-02-25 DIAGNOSIS — I25119 Atherosclerotic heart disease of native coronary artery with unspecified angina pectoris: Secondary | ICD-10-CM

## 2024-02-25 DIAGNOSIS — I252 Old myocardial infarction: Secondary | ICD-10-CM

## 2024-02-27 ENCOUNTER — Telehealth: Payer: Self-pay | Admitting: Cardiology

## 2024-02-27 ENCOUNTER — Other Ambulatory Visit (HOSPITAL_COMMUNITY): Payer: Self-pay

## 2024-02-27 ENCOUNTER — Other Ambulatory Visit: Payer: Self-pay

## 2024-02-27 DIAGNOSIS — I252 Old myocardial infarction: Secondary | ICD-10-CM

## 2024-02-27 DIAGNOSIS — I25119 Atherosclerotic heart disease of native coronary artery with unspecified angina pectoris: Secondary | ICD-10-CM

## 2024-02-27 MED ORDER — EFFIENT 10 MG PO TABS
10.0000 mg | ORAL_TABLET | Freq: Every day | ORAL | 1 refills | Status: DC
  Filled 2024-02-27: qty 90, 90d supply, fill #0

## 2024-02-27 NOTE — Telephone Encounter (Signed)
" °*  STAT* If patient is at the pharmacy, call can be transferred to refill team.   1. Which medications need to be refilled? (please list name of each medication and dose if known) EFFIENT  10 MG TABS tablet    2. Would you like to learn more about the convenience, safety, & potential cost savings by using the [facility]? No  3. Are you open to using the [facility] (Type [facility].\). No    4. Which pharmacy/location (including street and city if local pharmacy) is medication to be sent to?  - [facility]    5. Do they need a 30 day or 90 day supply? 90   Pt is out "

## 2024-02-28 ENCOUNTER — Ambulatory Visit
Admission: RE | Admit: 2024-02-28 | Discharge: 2024-02-28 | Disposition: A | Discharge status: Home / Self Care | Source: Ambulatory Visit | Attending: Internal Medicine | Admitting: Internal Medicine

## 2024-02-28 ENCOUNTER — Other Ambulatory Visit: Payer: Self-pay

## 2024-02-28 DIAGNOSIS — K760 Fatty (change of) liver, not elsewhere classified: Secondary | ICD-10-CM | POA: Insufficient documentation

## 2024-02-28 DIAGNOSIS — I2510 Atherosclerotic heart disease of native coronary artery without angina pectoris: Secondary | ICD-10-CM | POA: Insufficient documentation

## 2024-02-28 DIAGNOSIS — Z122 Encounter for screening for malignant neoplasm of respiratory organs: Secondary | ICD-10-CM | POA: Diagnosis present

## 2024-02-28 DIAGNOSIS — J439 Emphysema, unspecified: Secondary | ICD-10-CM | POA: Insufficient documentation

## 2024-02-28 DIAGNOSIS — I700 Atherosclerosis of aorta: Secondary | ICD-10-CM | POA: Insufficient documentation

## 2024-02-28 DIAGNOSIS — F17210 Nicotine dependence, cigarettes, uncomplicated: Secondary | ICD-10-CM | POA: Insufficient documentation

## 2024-02-29 ENCOUNTER — Other Ambulatory Visit (HOSPITAL_COMMUNITY): Payer: Self-pay

## 2024-03-01 MED ORDER — EFFIENT 10 MG PO TABS
10.0000 mg | ORAL_TABLET | Freq: Every day | ORAL | 2 refills | Status: AC
  Filled 2024-03-01: qty 90, 90d supply, fill #0

## 2024-03-01 NOTE — Telephone Encounter (Signed)
 Pt's medication was sent to pt's pharmacy as requested. Confirmation received.

## 2024-03-02 ENCOUNTER — Other Ambulatory Visit (HOSPITAL_COMMUNITY): Payer: Self-pay

## 2024-03-02 ENCOUNTER — Other Ambulatory Visit: Payer: Self-pay

## 2024-03-05 ENCOUNTER — Ambulatory Visit: Payer: Self-pay | Admitting: Internal Medicine

## 2024-03-07 ENCOUNTER — Telehealth: Payer: Self-pay | Admitting: Acute Care

## 2024-03-07 NOTE — Telephone Encounter (Addendum)
 Patient called in and left VM to be in the LCS program. Patient just recently had LDCT with his PCP in 02/2024 but would like to resume LCS with the screening program. I called patient back but was unable to leave a VM. The line would ring then go silent, I attempted twice.

## 2024-03-12 ENCOUNTER — Other Ambulatory Visit: Payer: Self-pay

## 2024-03-12 DIAGNOSIS — Z87891 Personal history of nicotine dependence: Secondary | ICD-10-CM

## 2024-03-12 DIAGNOSIS — F17210 Nicotine dependence, cigarettes, uncomplicated: Secondary | ICD-10-CM

## 2024-03-12 DIAGNOSIS — Z122 Encounter for screening for malignant neoplasm of respiratory organs: Secondary | ICD-10-CM

## 2024-03-14 ENCOUNTER — Other Ambulatory Visit: Payer: Self-pay | Admitting: Internal Medicine

## 2024-03-14 ENCOUNTER — Telehealth: Payer: Self-pay

## 2024-03-14 ENCOUNTER — Other Ambulatory Visit: Payer: Self-pay

## 2024-03-14 MED ORDER — BLOOD GLUCOSE MONITORING SUPPL DEVI: 1.0000 | Freq: Three times a day (TID) | 0 refills | Status: AC

## 2024-03-14 NOTE — Telephone Encounter (Signed)
 New prescription sent in for generic glucose monitor and supplies

## 2024-03-14 NOTE — Telephone Encounter (Signed)
 Copied from CRM [id number]. Topic: Clinical - Prescription Issue >> Mar 14, 2024 11:13 AM Troy Parrish wrote: Reason for CRM: Parrish stated patient's insurance does not cover glucose blood (ONETOUCH VERIO) test strip. They will need a new prescription for Accu check meter, strips, and lancets.  Parrish: Troy Parrish [id number] - [place], [place] - [address] Troy Troy Parrish [place] [address] Phone: [phone] Fax: [phone] Hours: Not open 24 hours

## 2024-03-14 NOTE — Telephone Encounter (Unsigned)
 Copied from CRM [id number]. Topic: Clinical - Medication Refill >> Mar 14, 2024 10:35 AM Ivette P wrote: Medication: Lancets (ONETOUCH DELICA PLUS LANCET33G) MISC ONETOUCH ULTRA test strip   Has the patient contacted their pharmacy? Yes (Agent: If no, request that the patient contact the pharmacy for the refill. If patient does not wish to contact the pharmacy document the reason why and proceed with request.) (Agent: If yes, when and what did the pharmacy advise?)  This is the patient's preferred pharmacy:  [facility] [id number] [place], [place] - [address] [place] [address] Phone: [phone] Fax: [phone]   Is this the correct pharmacy for this prescription? Yes If no, delete pharmacy and type the correct one.   Has the prescription been filled recently? Yes  Is the patient out of the medication? Yes, has none left  Has the patient been seen for an appointment in the last year OR does the patient have an upcoming appointment? Yes  Can we respond through MyChart? Yes  Agent: Please be advised that Rx refills may take up to 3 business days. We ask that you follow-up with your pharmacy.

## 2024-06-18 ENCOUNTER — Ambulatory Visit: Admitting: Internal Medicine

## 2024-11-02 ENCOUNTER — Ambulatory Visit
